# Patient Record
Sex: Female | Born: 1960 | Race: Black or African American | Hispanic: No | Marital: Single | Smoking: Former smoker
Health system: Southern US, Community
[De-identification: ages and names within clinical notes are randomized; demographics above are authoritative.]

## PROBLEM LIST (undated history)

## (undated) DIAGNOSIS — E039 Hypothyroidism, unspecified: Secondary | ICD-10-CM

## (undated) DIAGNOSIS — I099 Rheumatic heart disease, unspecified: Secondary | ICD-10-CM

## (undated) DIAGNOSIS — Z9889 Other specified postprocedural states: Principal | ICD-10-CM

## (undated) DIAGNOSIS — K922 Gastrointestinal hemorrhage, unspecified: Secondary | ICD-10-CM

## (undated) DIAGNOSIS — I272 Pulmonary hypertension, unspecified: Secondary | ICD-10-CM

## (undated) DIAGNOSIS — C73 Malignant neoplasm of thyroid gland: Secondary | ICD-10-CM

## (undated) DIAGNOSIS — I10 Essential (primary) hypertension: Secondary | ICD-10-CM

## (undated) DIAGNOSIS — E785 Hyperlipidemia, unspecified: Secondary | ICD-10-CM

## (undated) DIAGNOSIS — L910 Hypertrophic scar: Secondary | ICD-10-CM

## (undated) DIAGNOSIS — J45909 Unspecified asthma, uncomplicated: Secondary | ICD-10-CM

## (undated) DIAGNOSIS — N189 Chronic kidney disease, unspecified: Secondary | ICD-10-CM

## (undated) DIAGNOSIS — I5042 Chronic combined systolic (congestive) and diastolic (congestive) heart failure: Secondary | ICD-10-CM

## (undated) DIAGNOSIS — I428 Other cardiomyopathies: Principal | ICD-10-CM

## (undated) DIAGNOSIS — N950 Postmenopausal bleeding: Secondary | ICD-10-CM

## (undated) DIAGNOSIS — F0390 Unspecified dementia without behavioral disturbance: Secondary | ICD-10-CM

## (undated) DIAGNOSIS — Z952 Presence of prosthetic heart valve: Secondary | ICD-10-CM

## (undated) HISTORY — PX: THYROIDECTOMY: SHX17

## (undated) HISTORY — DX: Other cardiomyopathies: I42.8

## (undated) HISTORY — DX: Rheumatic heart disease, unspecified: I09.9

## (undated) HISTORY — DX: Other specified postprocedural states: Z98.890

## (undated) HISTORY — DX: Hypothyroidism, unspecified: E03.9

## (undated) HISTORY — PX: TEE WITHOUT CARDIOVERSION: SHX5443

## (undated) HISTORY — DX: Pulmonary hypertension, unspecified: I27.20

## (undated) HISTORY — DX: Essential (primary) hypertension: I10

## (undated) HISTORY — DX: Unspecified dementia, unspecified severity, without behavioral disturbance, psychotic disturbance, mood disturbance, and anxiety: F03.90

## (undated) HISTORY — DX: Hypertrophic scar: L91.0

## (undated) HISTORY — DX: Hyperlipidemia, unspecified: E78.5

## (undated) HISTORY — DX: Postmenopausal bleeding: N95.0

## (undated) HISTORY — DX: Morbid (severe) obesity due to excess calories: E66.01

## (undated) HISTORY — DX: Unspecified asthma, uncomplicated: J45.909

## (undated) HISTORY — DX: Gastrointestinal hemorrhage, unspecified: K92.2

## (undated) HISTORY — DX: Chronic combined systolic (congestive) and diastolic (congestive) heart failure: I50.42

---

## 1997-11-27 ENCOUNTER — Emergency Department (HOSPITAL_COMMUNITY): Admission: EM | Admit: 1997-11-27 | Discharge: 1997-11-28 | Payer: Self-pay | Admitting: Emergency Medicine

## 1997-11-28 ENCOUNTER — Ambulatory Visit (HOSPITAL_COMMUNITY): Admission: RE | Admit: 1997-11-28 | Discharge: 1997-11-28 | Payer: Self-pay | Admitting: Emergency Medicine

## 1998-03-05 ENCOUNTER — Emergency Department (HOSPITAL_COMMUNITY): Admission: EM | Admit: 1998-03-05 | Discharge: 1998-03-05 | Payer: Self-pay | Admitting: Emergency Medicine

## 2001-07-02 ENCOUNTER — Emergency Department (HOSPITAL_COMMUNITY): Admission: EM | Admit: 2001-07-02 | Discharge: 2001-07-02 | Payer: Self-pay | Admitting: Emergency Medicine

## 2001-10-31 ENCOUNTER — Encounter: Payer: Self-pay | Admitting: Obstetrics and Gynecology

## 2001-10-31 ENCOUNTER — Ambulatory Visit (HOSPITAL_COMMUNITY): Admission: RE | Admit: 2001-10-31 | Discharge: 2001-10-31 | Payer: Self-pay | Admitting: Obstetrics and Gynecology

## 2001-12-05 ENCOUNTER — Encounter: Payer: Self-pay | Admitting: Emergency Medicine

## 2001-12-05 ENCOUNTER — Emergency Department (HOSPITAL_COMMUNITY): Admission: EM | Admit: 2001-12-05 | Discharge: 2001-12-05 | Payer: Self-pay | Admitting: Emergency Medicine

## 2004-07-18 ENCOUNTER — Emergency Department (HOSPITAL_COMMUNITY): Admission: EM | Admit: 2004-07-18 | Discharge: 2004-07-18 | Payer: Self-pay | Admitting: Emergency Medicine

## 2004-08-02 ENCOUNTER — Emergency Department (HOSPITAL_COMMUNITY): Admission: EM | Admit: 2004-08-02 | Discharge: 2004-08-02 | Payer: Self-pay | Admitting: Emergency Medicine

## 2005-03-06 ENCOUNTER — Emergency Department (HOSPITAL_COMMUNITY): Admission: EM | Admit: 2005-03-06 | Discharge: 2005-03-06 | Payer: Self-pay | Admitting: Emergency Medicine

## 2005-07-16 ENCOUNTER — Emergency Department (HOSPITAL_COMMUNITY): Admission: EM | Admit: 2005-07-16 | Discharge: 2005-07-16 | Payer: Self-pay | Admitting: Emergency Medicine

## 2005-08-02 ENCOUNTER — Emergency Department (HOSPITAL_COMMUNITY): Admission: EM | Admit: 2005-08-02 | Discharge: 2005-08-02 | Payer: Self-pay | Admitting: Emergency Medicine

## 2005-09-07 ENCOUNTER — Emergency Department (HOSPITAL_COMMUNITY): Admission: EM | Admit: 2005-09-07 | Discharge: 2005-09-07 | Payer: Self-pay | Admitting: Family Medicine

## 2005-10-08 ENCOUNTER — Emergency Department (HOSPITAL_COMMUNITY): Admission: EM | Admit: 2005-10-08 | Discharge: 2005-10-08 | Payer: Self-pay | Admitting: *Deleted

## 2005-10-17 ENCOUNTER — Inpatient Hospital Stay (HOSPITAL_COMMUNITY): Admission: EM | Admit: 2005-10-17 | Discharge: 2005-10-20 | Payer: Self-pay | Admitting: Emergency Medicine

## 2005-10-18 ENCOUNTER — Encounter (INDEPENDENT_AMBULATORY_CARE_PROVIDER_SITE_OTHER): Payer: Self-pay | Admitting: *Deleted

## 2005-10-25 ENCOUNTER — Ambulatory Visit: Payer: Self-pay | Admitting: Gastroenterology

## 2006-10-17 ENCOUNTER — Emergency Department (HOSPITAL_COMMUNITY): Admission: EM | Admit: 2006-10-17 | Discharge: 2006-10-18 | Payer: Self-pay | Admitting: Emergency Medicine

## 2006-10-29 ENCOUNTER — Inpatient Hospital Stay (HOSPITAL_COMMUNITY): Admission: EM | Admit: 2006-10-29 | Discharge: 2006-10-31 | Payer: Self-pay | Admitting: Emergency Medicine

## 2006-10-29 ENCOUNTER — Ambulatory Visit: Payer: Self-pay | Admitting: Family Medicine

## 2006-10-30 ENCOUNTER — Encounter: Payer: Self-pay | Admitting: Family Medicine

## 2007-02-03 ENCOUNTER — Inpatient Hospital Stay (HOSPITAL_COMMUNITY): Admission: EM | Admit: 2007-02-03 | Discharge: 2007-02-07 | Payer: Self-pay | Admitting: Emergency Medicine

## 2007-07-17 ENCOUNTER — Encounter (INDEPENDENT_AMBULATORY_CARE_PROVIDER_SITE_OTHER): Payer: Self-pay | Admitting: Internal Medicine

## 2007-07-17 ENCOUNTER — Inpatient Hospital Stay (HOSPITAL_COMMUNITY): Admission: EM | Admit: 2007-07-17 | Discharge: 2007-07-22 | Payer: Self-pay | Admitting: Emergency Medicine

## 2007-08-04 ENCOUNTER — Inpatient Hospital Stay (HOSPITAL_COMMUNITY): Admission: AD | Admit: 2007-08-04 | Discharge: 2007-08-05 | Payer: Self-pay | Admitting: Obstetrics & Gynecology

## 2007-08-21 ENCOUNTER — Inpatient Hospital Stay (HOSPITAL_COMMUNITY): Admission: AD | Admit: 2007-08-21 | Discharge: 2007-08-21 | Payer: Self-pay | Admitting: Obstetrics & Gynecology

## 2007-09-05 ENCOUNTER — Ambulatory Visit: Payer: Self-pay | Admitting: Obstetrics & Gynecology

## 2007-09-05 ENCOUNTER — Encounter: Payer: Self-pay | Admitting: Obstetrics & Gynecology

## 2007-10-08 ENCOUNTER — Ambulatory Visit: Payer: Self-pay | Admitting: Obstetrics & Gynecology

## 2007-10-08 ENCOUNTER — Other Ambulatory Visit: Admission: RE | Admit: 2007-10-08 | Discharge: 2007-10-08 | Payer: Self-pay | Admitting: Obstetrics & Gynecology

## 2007-10-08 ENCOUNTER — Ambulatory Visit (HOSPITAL_COMMUNITY): Admission: RE | Admit: 2007-10-08 | Discharge: 2007-10-08 | Payer: Self-pay | Admitting: Obstetrics & Gynecology

## 2007-10-08 ENCOUNTER — Encounter: Payer: Self-pay | Admitting: Obstetrics & Gynecology

## 2007-12-10 ENCOUNTER — Emergency Department (HOSPITAL_COMMUNITY): Admission: EM | Admit: 2007-12-10 | Discharge: 2007-12-10 | Payer: Self-pay | Admitting: Emergency Medicine

## 2008-06-13 ENCOUNTER — Inpatient Hospital Stay (HOSPITAL_COMMUNITY): Admission: EM | Admit: 2008-06-13 | Discharge: 2008-06-16 | Payer: Self-pay | Admitting: Emergency Medicine

## 2008-06-13 ENCOUNTER — Other Ambulatory Visit: Payer: Self-pay | Admitting: Obstetrics & Gynecology

## 2008-06-16 ENCOUNTER — Encounter (INDEPENDENT_AMBULATORY_CARE_PROVIDER_SITE_OTHER): Payer: Self-pay | Admitting: *Deleted

## 2008-07-10 ENCOUNTER — Ambulatory Visit: Payer: Self-pay | Admitting: Internal Medicine

## 2008-07-16 ENCOUNTER — Encounter (INDEPENDENT_AMBULATORY_CARE_PROVIDER_SITE_OTHER): Payer: Self-pay | Admitting: Interventional Radiology

## 2008-07-16 ENCOUNTER — Encounter: Admission: RE | Admit: 2008-07-16 | Discharge: 2008-07-16 | Payer: Self-pay | Admitting: Internal Medicine

## 2008-07-16 ENCOUNTER — Other Ambulatory Visit: Admission: RE | Admit: 2008-07-16 | Discharge: 2008-07-16 | Payer: Self-pay | Admitting: Interventional Radiology

## 2008-07-30 ENCOUNTER — Ambulatory Visit: Payer: Self-pay | Admitting: Internal Medicine

## 2008-07-30 LAB — CONVERTED CEMR LAB
Basophils Relative: 1 % (ref 0–1)
Eosinophils Absolute: 0.2 10*3/uL (ref 0.0–0.7)
Hemoglobin: 12.4 g/dL (ref 12.0–15.0)
Lymphs Abs: 1.6 10*3/uL (ref 0.7–4.0)
MCHC: 32.5 g/dL (ref 30.0–36.0)
MCV: 77.3 fL — ABNORMAL LOW (ref 78.0–100.0)
Monocytes Absolute: 0.7 10*3/uL (ref 0.1–1.0)
Monocytes Relative: 8 % (ref 3–12)
RBC: 4.94 M/uL (ref 3.87–5.11)
Retic Ct Pct: 1 % (ref 0.4–3.1)

## 2008-07-31 ENCOUNTER — Ambulatory Visit: Payer: Self-pay | Admitting: Obstetrics and Gynecology

## 2008-07-31 LAB — CONVERTED CEMR LAB
HCT: 35.8 % — ABNORMAL LOW (ref 36.0–46.0)
Hemoglobin: 11.8 g/dL — ABNORMAL LOW (ref 12.0–15.0)
RBC: 4.67 M/uL (ref 3.87–5.11)
RDW: 27.4 % — ABNORMAL HIGH (ref 11.5–15.5)

## 2008-09-10 ENCOUNTER — Encounter (INDEPENDENT_AMBULATORY_CARE_PROVIDER_SITE_OTHER): Payer: Self-pay | Admitting: *Deleted

## 2008-09-14 IMAGING — US US PELVIS COMPLETE MODIFY
1 series · 14 of 25 positions shown · non-contrast
Comparison: None.

CLINICAL DATA: Menorrhagia.  
 TRANSABDOMINAL AND TRANSVAGINAL PELVIC ULTRASOUND:
TECHNIQUE: Both transabdominal and transvaginal ultrasound examinations of the pelvis were performed including evaluation of the uterus, ovaries, adnexal regions, and pelvic cul-de-sac.

[Series 1: us pelvis complete modify · 0.24mm/px · 14 of 42 slices shown]
[im 1/42]
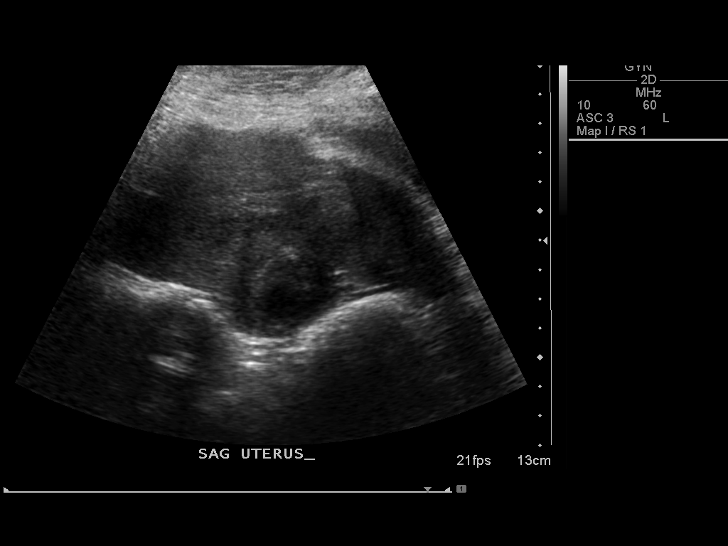
[im 4/42]
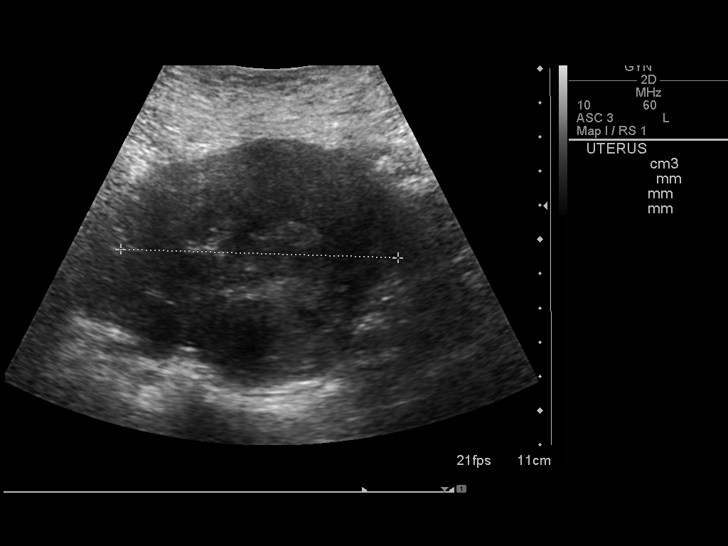
[im 7/42]
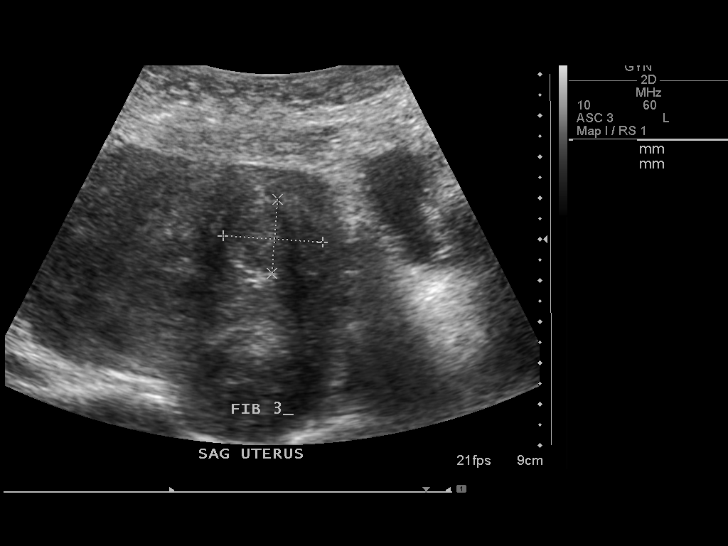
[im 11/42]
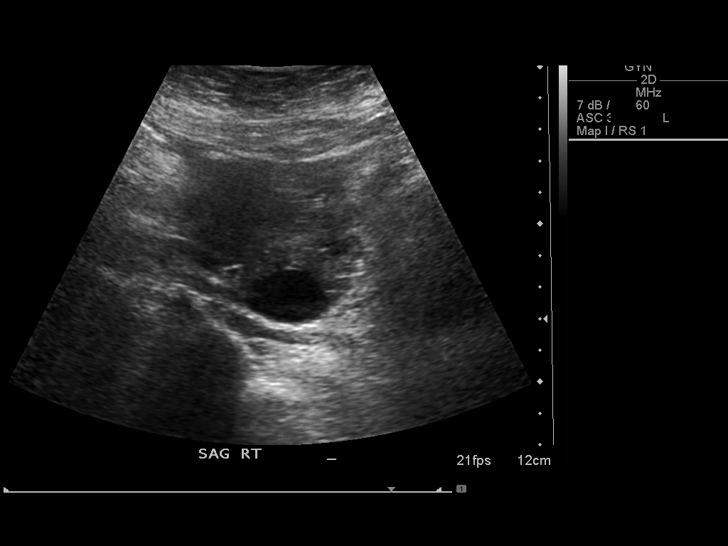
[im 14/42]
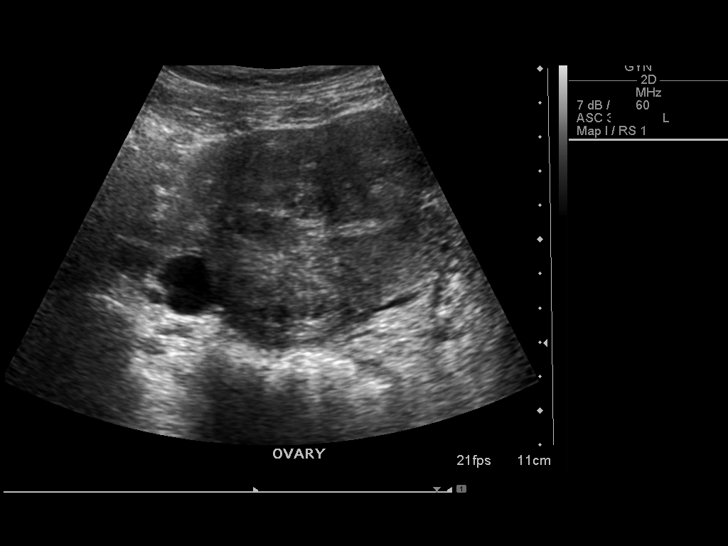
[im 16/42]
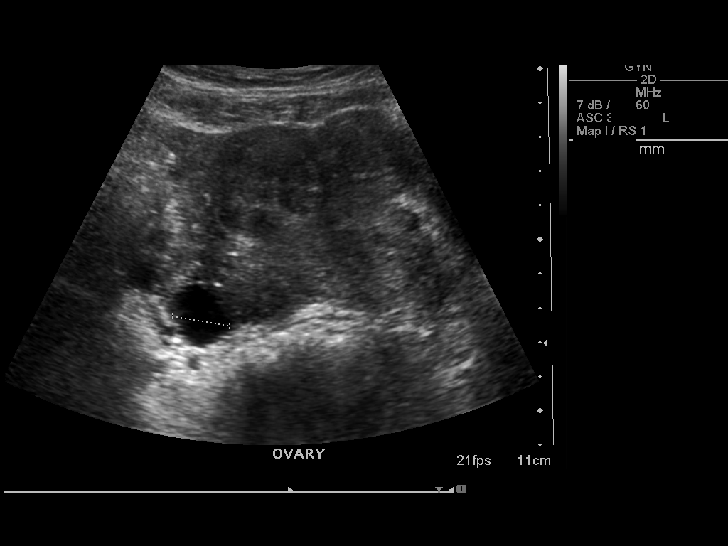
[im 19/42]
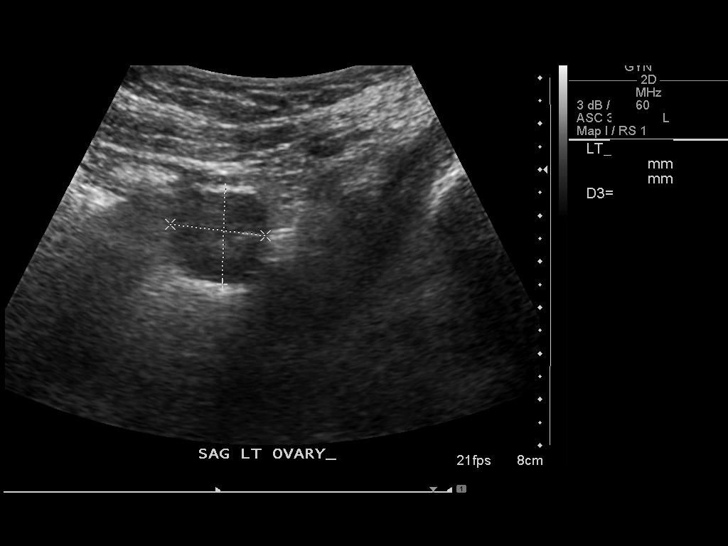
[im 23/42]
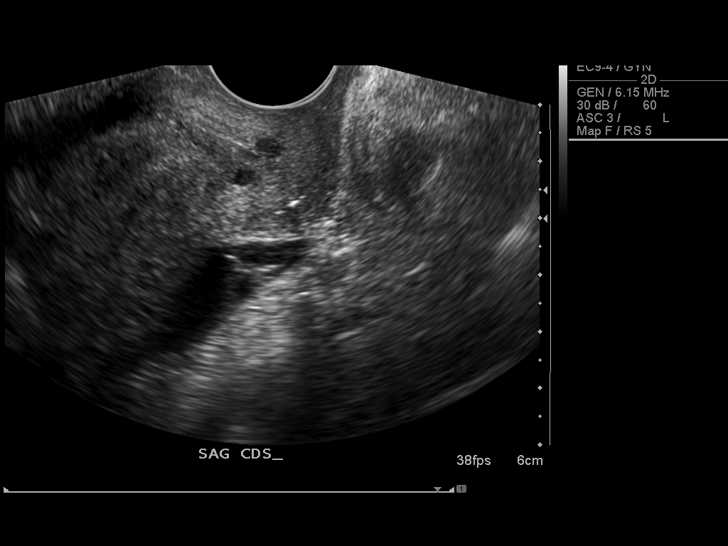
[im 26/42]
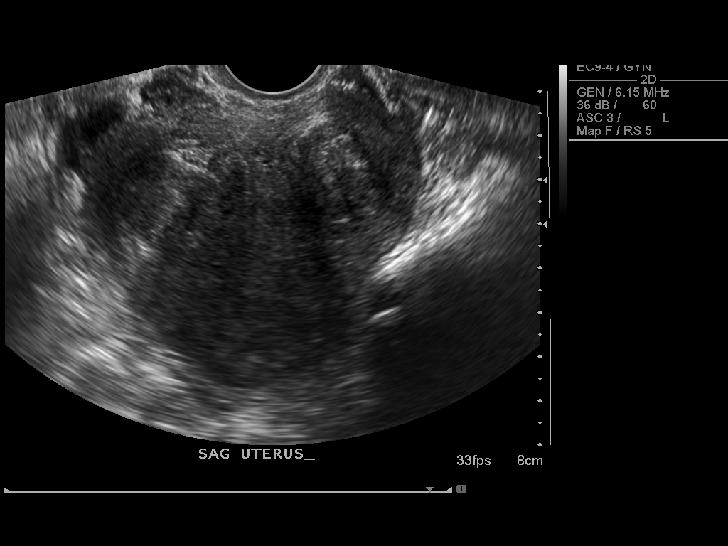
[im 28/42]
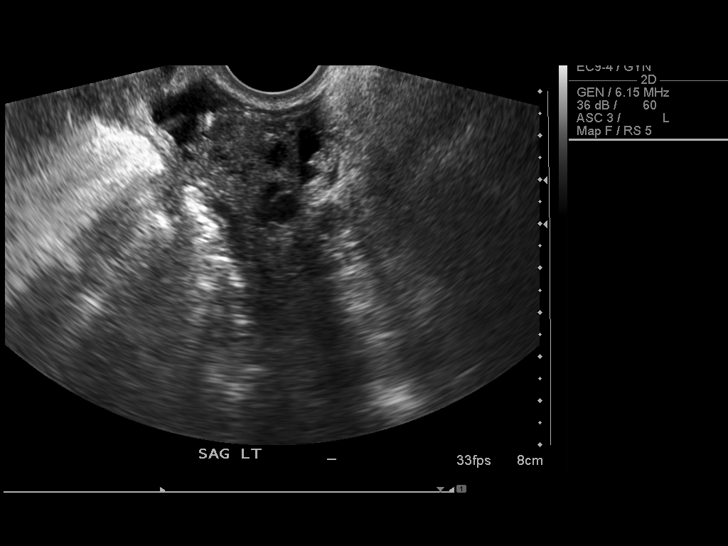
[im 31/42]
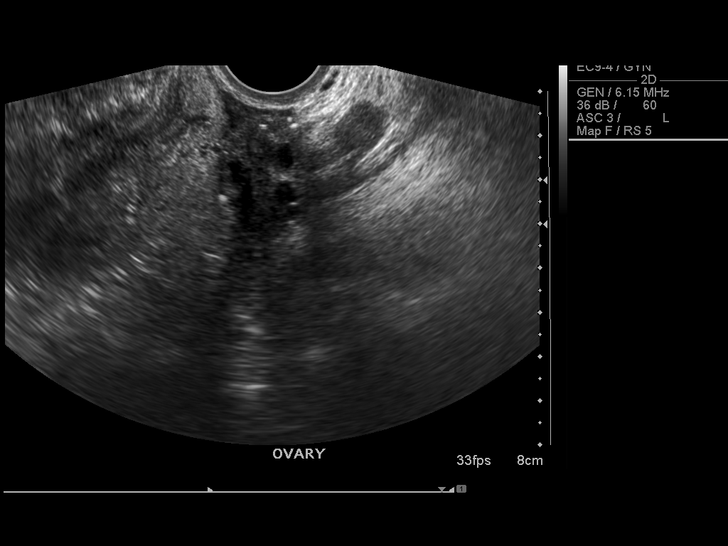
[im 35/42]
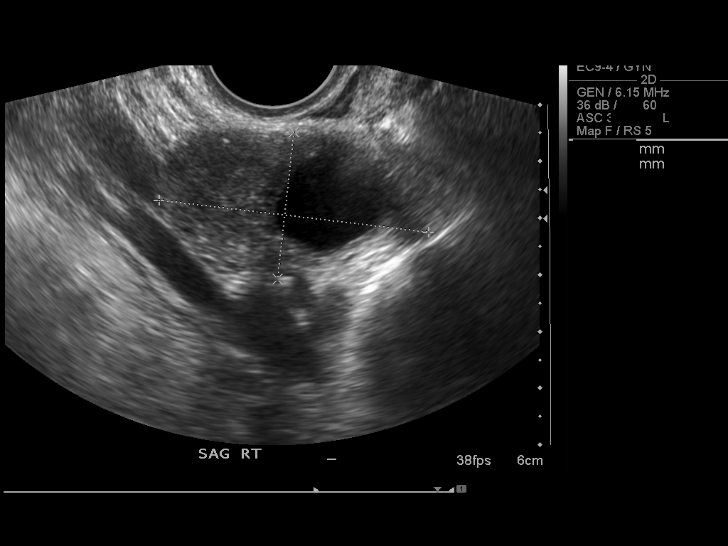
[im 38/42]
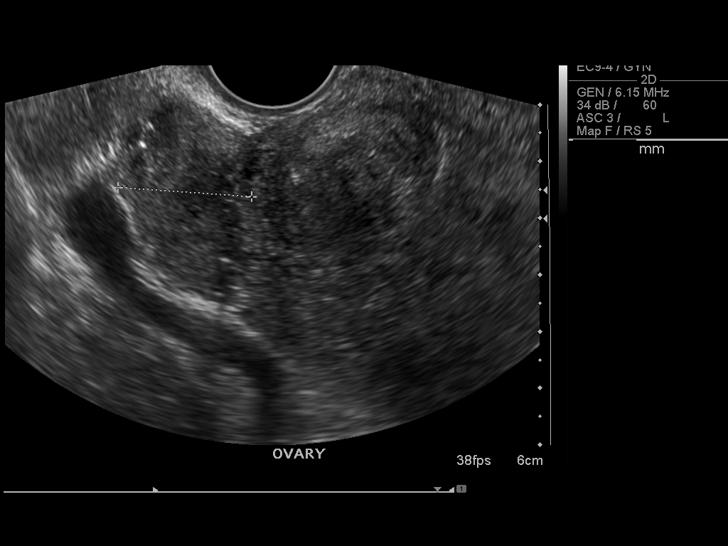
[im 42/42]
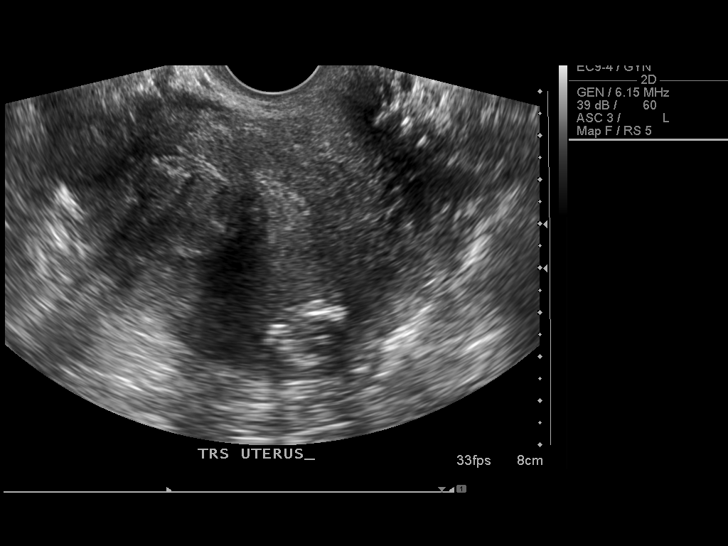

[14 of 25 positions shown; findings below may reference images not displayed]

FINDINGS: Overall uterine measurements 11.5 x 8.1 x 7.1 cm.  Multiple predominantly intramural, partially subserosal hypoechoic uterine masses are identified, likely fibroids.  The largest of these in the posterior uterine body is partly subserosal/partially intramural, measuring 4.4 x 3.7 x 2.8 cm.  Some intramural fibroids produce mass effect upon the endometrial stripe, which measures 9 mm and is uniformly echogenic.  Ovaries are normal.  A small amount of pelvic free fluid is noted.
IMPRESSION: 1.  Fibroid uterus, some of which demonstrate mass effect upon the endometrial stripe.
 2.  Normal ovaries bilaterally.

## 2008-10-21 ENCOUNTER — Ambulatory Visit: Payer: Self-pay | Admitting: Internal Medicine

## 2008-10-21 LAB — CONVERTED CEMR LAB
Alkaline Phosphatase: 78 units/L (ref 39–117)
BUN: 11 mg/dL (ref 6–23)
Basophils Absolute: 0 10*3/uL (ref 0.0–0.1)
Basophils Relative: 1 % (ref 0–1)
CO2: 24 meq/L (ref 19–32)
Creatinine, Ser: 0.82 mg/dL (ref 0.40–1.20)
Eosinophils Absolute: 0.2 10*3/uL (ref 0.0–0.7)
Eosinophils Relative: 3 % (ref 0–5)
Free T4: 1.05 ng/dL (ref 0.80–1.80)
Glucose, Bld: 87 mg/dL (ref 70–99)
Hemoglobin: 12.6 g/dL (ref 12.0–15.0)
MCHC: 32.1 g/dL (ref 30.0–36.0)
MCV: 87.9 fL (ref 78.0–100.0)
Magnesium: 2.1 mg/dL (ref 1.5–2.5)
Monocytes Absolute: 0.4 10*3/uL (ref 0.1–1.0)
Neutro Abs: 5.7 10*3/uL (ref 1.7–7.7)
Pro B Natriuretic peptide (BNP): 454.2 pg/mL — ABNORMAL HIGH (ref 0.0–100.0)
RDW: 15.9 % — ABNORMAL HIGH (ref 11.5–15.5)
Sodium: 141 meq/L (ref 135–145)
TSH: 0.708 microintl units/mL (ref 0.350–4.500)
Total Bilirubin: 0.9 mg/dL (ref 0.3–1.2)

## 2008-10-28 ENCOUNTER — Encounter: Payer: Self-pay | Admitting: Internal Medicine

## 2008-10-28 ENCOUNTER — Ambulatory Visit (HOSPITAL_COMMUNITY): Admission: RE | Admit: 2008-10-28 | Discharge: 2008-10-28 | Payer: Self-pay | Admitting: Internal Medicine

## 2008-11-18 ENCOUNTER — Ambulatory Visit: Payer: Self-pay | Admitting: Internal Medicine

## 2008-11-26 ENCOUNTER — Encounter (INDEPENDENT_AMBULATORY_CARE_PROVIDER_SITE_OTHER): Payer: Self-pay | Admitting: Cardiology

## 2008-11-26 ENCOUNTER — Inpatient Hospital Stay (HOSPITAL_COMMUNITY): Admission: RE | Admit: 2008-11-26 | Discharge: 2008-12-05 | Payer: Self-pay | Admitting: Cardiology

## 2008-11-27 ENCOUNTER — Encounter (INDEPENDENT_AMBULATORY_CARE_PROVIDER_SITE_OTHER): Payer: Self-pay | Admitting: Cardiology

## 2008-11-27 ENCOUNTER — Ambulatory Visit: Payer: Self-pay | Admitting: Surgery

## 2008-11-27 ENCOUNTER — Ambulatory Visit: Payer: Self-pay | Admitting: Cardiothoracic Surgery

## 2008-11-28 ENCOUNTER — Ambulatory Visit: Payer: Self-pay | Admitting: Dentistry

## 2008-12-01 ENCOUNTER — Encounter (HOSPITAL_COMMUNITY): Payer: Self-pay | Admitting: Dentistry

## 2008-12-10 ENCOUNTER — Encounter: Admission: AD | Admit: 2008-12-10 | Discharge: 2008-12-10 | Payer: Self-pay | Admitting: Dentistry

## 2008-12-16 ENCOUNTER — Ambulatory Visit: Payer: Self-pay | Admitting: Thoracic Surgery (Cardiothoracic Vascular Surgery)

## 2008-12-19 ENCOUNTER — Encounter: Payer: Self-pay | Admitting: Thoracic Surgery (Cardiothoracic Vascular Surgery)

## 2008-12-19 ENCOUNTER — Ambulatory Visit (HOSPITAL_COMMUNITY)
Admission: RE | Admit: 2008-12-19 | Discharge: 2008-12-19 | Payer: Self-pay | Admitting: Thoracic Surgery (Cardiothoracic Vascular Surgery)

## 2008-12-19 ENCOUNTER — Ambulatory Visit: Payer: Self-pay | Admitting: *Deleted

## 2008-12-21 DIAGNOSIS — I428 Other cardiomyopathies: Secondary | ICD-10-CM

## 2008-12-21 HISTORY — PX: AORTIC VALVE REPAIR: SHX6306

## 2008-12-21 HISTORY — PX: CARDIAC CATHETERIZATION: SHX172

## 2008-12-21 HISTORY — PX: MITRAL VALVE REPAIR: SHX2039

## 2008-12-21 HISTORY — DX: Other cardiomyopathies: I42.8

## 2008-12-23 ENCOUNTER — Inpatient Hospital Stay (HOSPITAL_COMMUNITY)
Admission: RE | Admit: 2008-12-23 | Discharge: 2009-01-01 | Payer: Self-pay | Admitting: Thoracic Surgery (Cardiothoracic Vascular Surgery)

## 2008-12-23 ENCOUNTER — Encounter: Payer: Self-pay | Admitting: Thoracic Surgery (Cardiothoracic Vascular Surgery)

## 2008-12-23 DIAGNOSIS — Z9889 Other specified postprocedural states: Secondary | ICD-10-CM

## 2008-12-23 DIAGNOSIS — I099 Rheumatic heart disease, unspecified: Secondary | ICD-10-CM | POA: Diagnosis present

## 2008-12-23 HISTORY — DX: Other specified postprocedural states: Z98.890

## 2008-12-23 HISTORY — DX: Rheumatic heart disease, unspecified: I09.9

## 2009-01-19 ENCOUNTER — Encounter
Admission: RE | Admit: 2009-01-19 | Discharge: 2009-01-19 | Payer: Self-pay | Admitting: Thoracic Surgery (Cardiothoracic Vascular Surgery)

## 2009-01-19 ENCOUNTER — Ambulatory Visit: Payer: Self-pay | Admitting: Thoracic Surgery (Cardiothoracic Vascular Surgery)

## 2009-02-04 ENCOUNTER — Ambulatory Visit: Payer: Self-pay | Admitting: Dentistry

## 2009-03-16 ENCOUNTER — Ambulatory Visit: Payer: Self-pay | Admitting: Thoracic Surgery (Cardiothoracic Vascular Surgery)

## 2009-03-30 ENCOUNTER — Ambulatory Visit: Payer: Self-pay | Admitting: Dentistry

## 2009-04-06 ENCOUNTER — Telehealth (INDEPENDENT_AMBULATORY_CARE_PROVIDER_SITE_OTHER): Payer: Self-pay | Admitting: *Deleted

## 2009-05-05 ENCOUNTER — Emergency Department (HOSPITAL_COMMUNITY): Admission: EM | Admit: 2009-05-05 | Discharge: 2009-05-05 | Payer: Self-pay | Admitting: Emergency Medicine

## 2009-05-18 ENCOUNTER — Ambulatory Visit: Payer: Self-pay | Admitting: Internal Medicine

## 2009-05-18 LAB — CONVERTED CEMR LAB
ALT: 12 units/L (ref 0–35)
AST: 15 units/L (ref 0–37)
CO2: 21 meq/L (ref 19–32)
Chloride: 107 meq/L (ref 96–112)
Creatinine, Ser: 0.82 mg/dL (ref 0.40–1.20)
Eosinophils Absolute: 0.1 10*3/uL (ref 0.0–0.7)
Lymphs Abs: 1.7 10*3/uL (ref 0.7–4.0)
Monocytes Relative: 6 % (ref 3–12)
Neutro Abs: 5.5 10*3/uL (ref 1.7–7.7)
Neutrophils Relative %: 71 % (ref 43–77)
Platelets: 281 10*3/uL (ref 150–400)
RBC: 4.66 M/uL (ref 3.87–5.11)
Sodium: 139 meq/L (ref 135–145)
TSH: 1.68 microintl units/mL (ref 0.350–4.500)
Total Bilirubin: 0.6 mg/dL (ref 0.3–1.2)
Total Protein: 6.8 g/dL (ref 6.0–8.3)
WBC: 7.8 10*3/uL (ref 4.0–10.5)

## 2009-06-26 ENCOUNTER — Ambulatory Visit: Payer: Self-pay | Admitting: Internal Medicine

## 2009-06-26 LAB — CONVERTED CEMR LAB
BUN: 20 mg/dL (ref 6–23)
Chloride: 106 meq/L (ref 96–112)
Creatinine, Ser: 0.87 mg/dL (ref 0.40–1.20)
Glucose, Bld: 83 mg/dL (ref 70–99)
Potassium: 4.7 meq/L (ref 3.5–5.3)

## 2009-07-01 ENCOUNTER — Encounter: Admission: RE | Admit: 2009-07-01 | Discharge: 2009-07-01 | Payer: Self-pay | Admitting: Cardiology

## 2009-07-25 IMAGING — CR DG CHEST 2V
2 series · 2 of 2 positions shown · non-contrast
Comparison: 12/10/2007

CLINICAL DATA: Chest pain, shortness of breath

CHEST - 2 VIEW

[w chest pa]
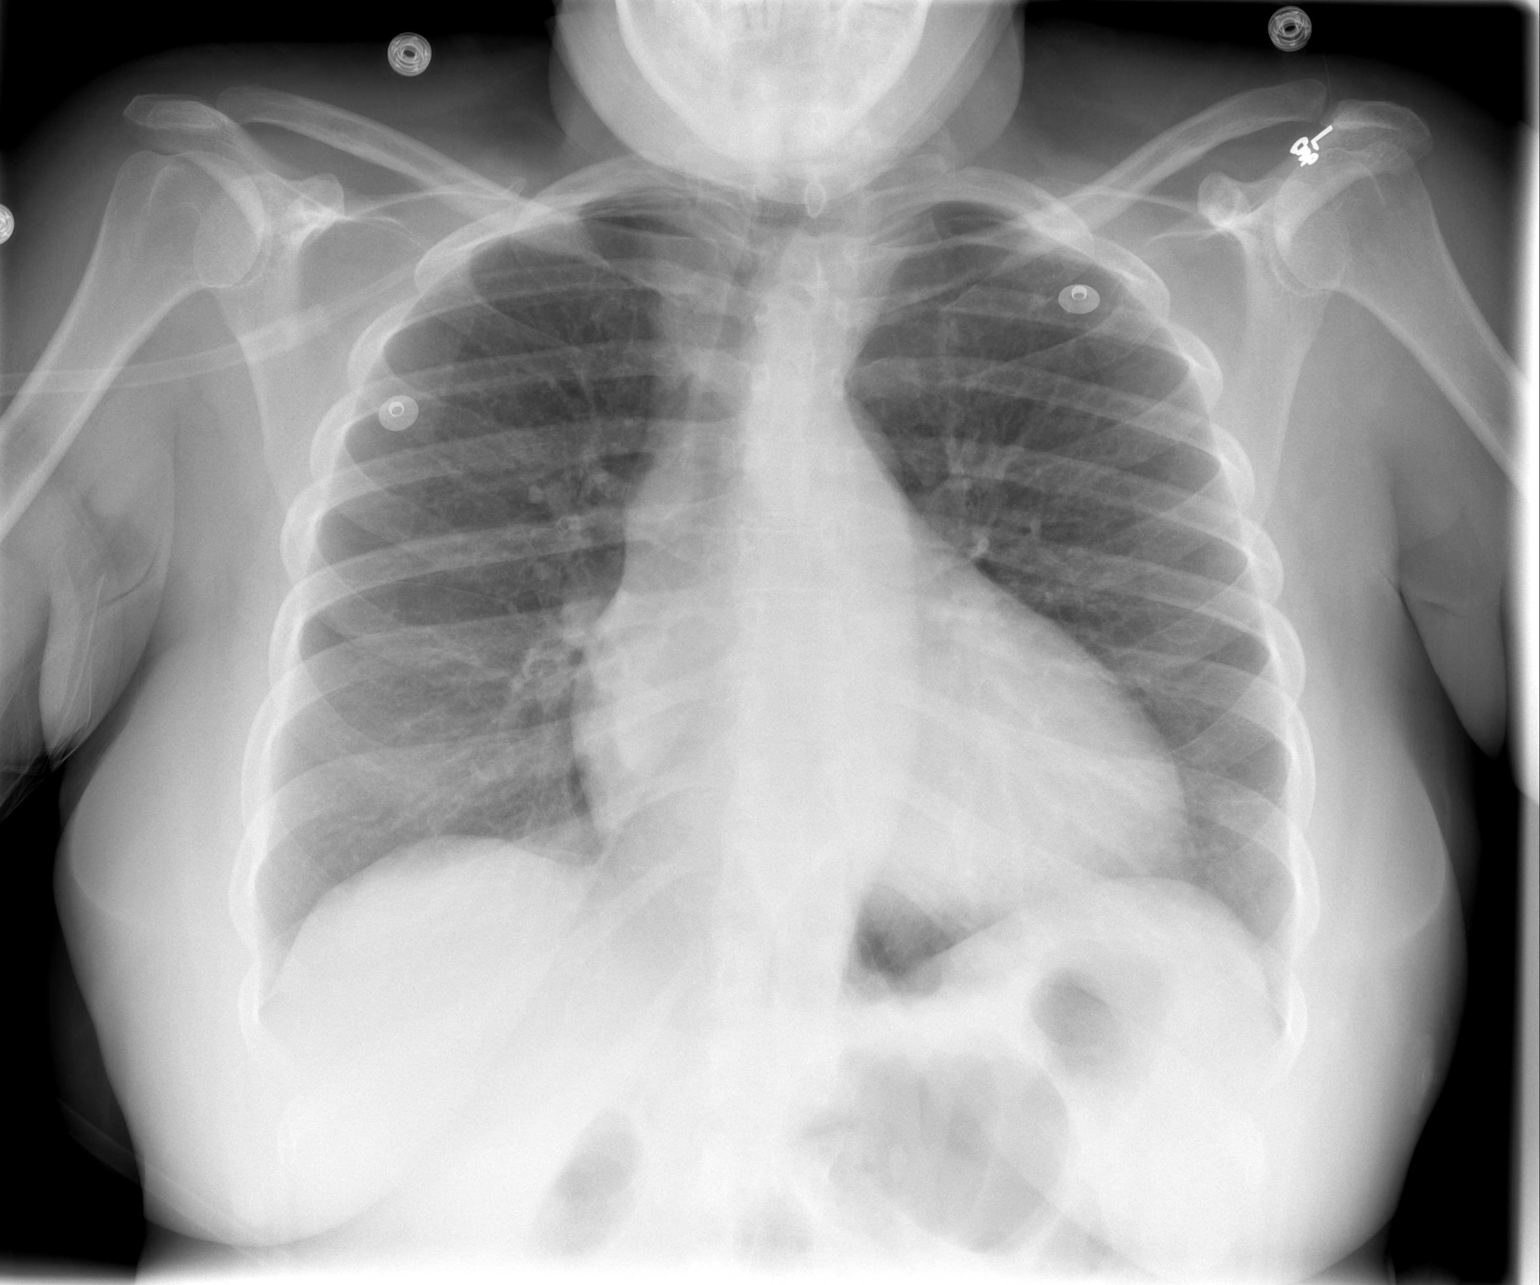

[w chest lat]
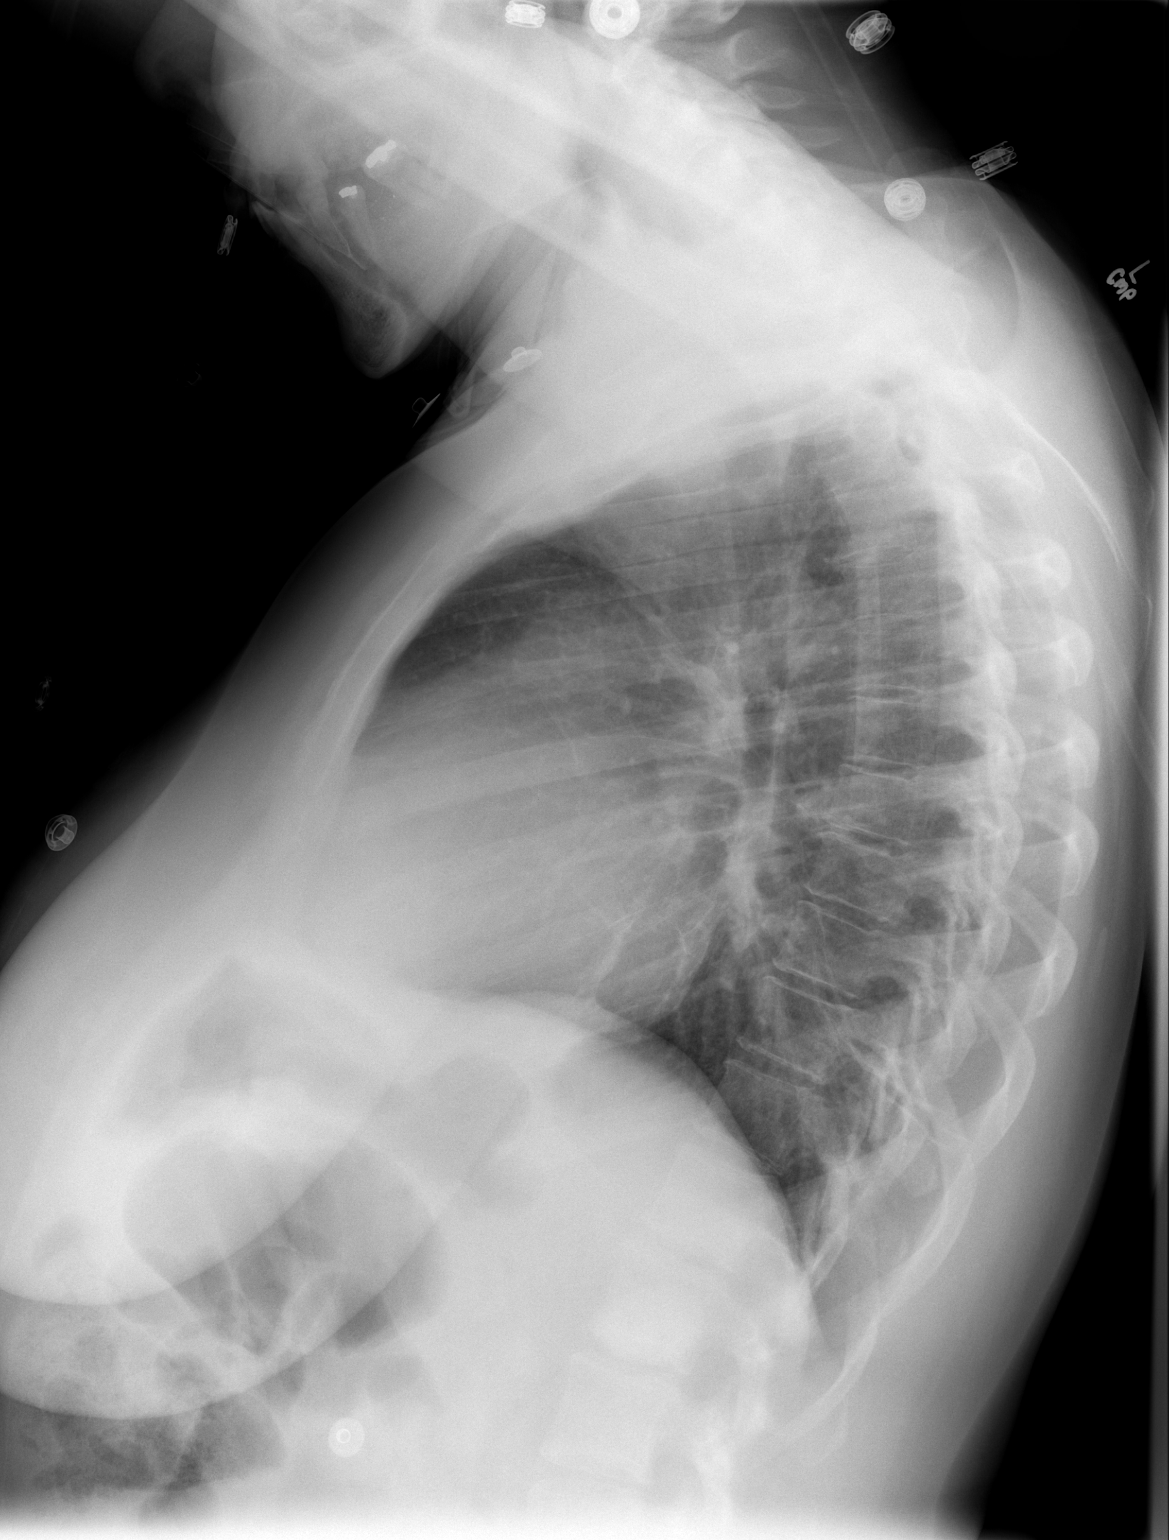

[2 of 2 positions shown; findings below may reference images not displayed]

FINDINGS: Stable cardiomegaly without CHF, pneumonia, effusion or
pneumothorax.  Trachea is deviated slightly to the right.  This is
suspicious for a substernal left thyroid abnormality.  Consider
follow-up ultrasound.
IMPRESSION: Cardiomegaly without CHF or pneumonia
Rightward tracheal deviation suspicious for a substernal left
thyroid goiter versus mass

## 2009-07-27 ENCOUNTER — Encounter
Admission: RE | Admit: 2009-07-27 | Discharge: 2009-07-27 | Payer: Self-pay | Admitting: Thoracic Surgery (Cardiothoracic Vascular Surgery)

## 2009-08-03 ENCOUNTER — Ambulatory Visit: Payer: Self-pay | Admitting: Thoracic Surgery (Cardiothoracic Vascular Surgery)

## 2009-08-06 ENCOUNTER — Encounter
Admission: RE | Admit: 2009-08-06 | Discharge: 2009-08-06 | Payer: Self-pay | Admitting: Thoracic Surgery (Cardiothoracic Vascular Surgery)

## 2009-08-17 ENCOUNTER — Ambulatory Visit: Payer: Self-pay | Admitting: Internal Medicine

## 2009-09-17 ENCOUNTER — Ambulatory Visit (HOSPITAL_COMMUNITY): Admission: RE | Admit: 2009-09-17 | Discharge: 2009-09-18 | Payer: Self-pay | Admitting: Surgery

## 2009-09-17 ENCOUNTER — Encounter (INDEPENDENT_AMBULATORY_CARE_PROVIDER_SITE_OTHER): Payer: Self-pay | Admitting: Surgery

## 2009-09-17 DIAGNOSIS — C73 Malignant neoplasm of thyroid gland: Secondary | ICD-10-CM

## 2009-09-17 HISTORY — PX: THYROID LOBECTOMY: SHX420

## 2009-09-17 HISTORY — DX: Malignant neoplasm of thyroid gland: C73

## 2009-09-22 ENCOUNTER — Encounter (INDEPENDENT_AMBULATORY_CARE_PROVIDER_SITE_OTHER): Payer: Self-pay | Admitting: Surgery

## 2009-09-22 ENCOUNTER — Ambulatory Visit (HOSPITAL_COMMUNITY): Admission: RE | Admit: 2009-09-22 | Discharge: 2009-09-23 | Payer: Self-pay | Admitting: Surgery

## 2009-12-02 ENCOUNTER — Encounter (HOSPITAL_COMMUNITY): Admission: RE | Admit: 2009-12-02 | Discharge: 2009-12-11 | Payer: Self-pay | Admitting: Endocrinology

## 2010-02-01 ENCOUNTER — Encounter
Admission: RE | Admit: 2010-02-01 | Discharge: 2010-02-01 | Payer: Self-pay | Admitting: Thoracic Surgery (Cardiothoracic Vascular Surgery)

## 2010-02-01 ENCOUNTER — Ambulatory Visit: Payer: Self-pay | Admitting: Thoracic Surgery (Cardiothoracic Vascular Surgery)

## 2010-02-03 IMAGING — CR DG CHEST 1V PORT
1 series · 1 of 1 positions shown · non-contrast
Comparison: 12/19/2008

CLINICAL DATA: CABG.  Mitral regurgitation.  Aortic insufficiency.

PORTABLE CHEST - 1 VIEW

[view not recorded]
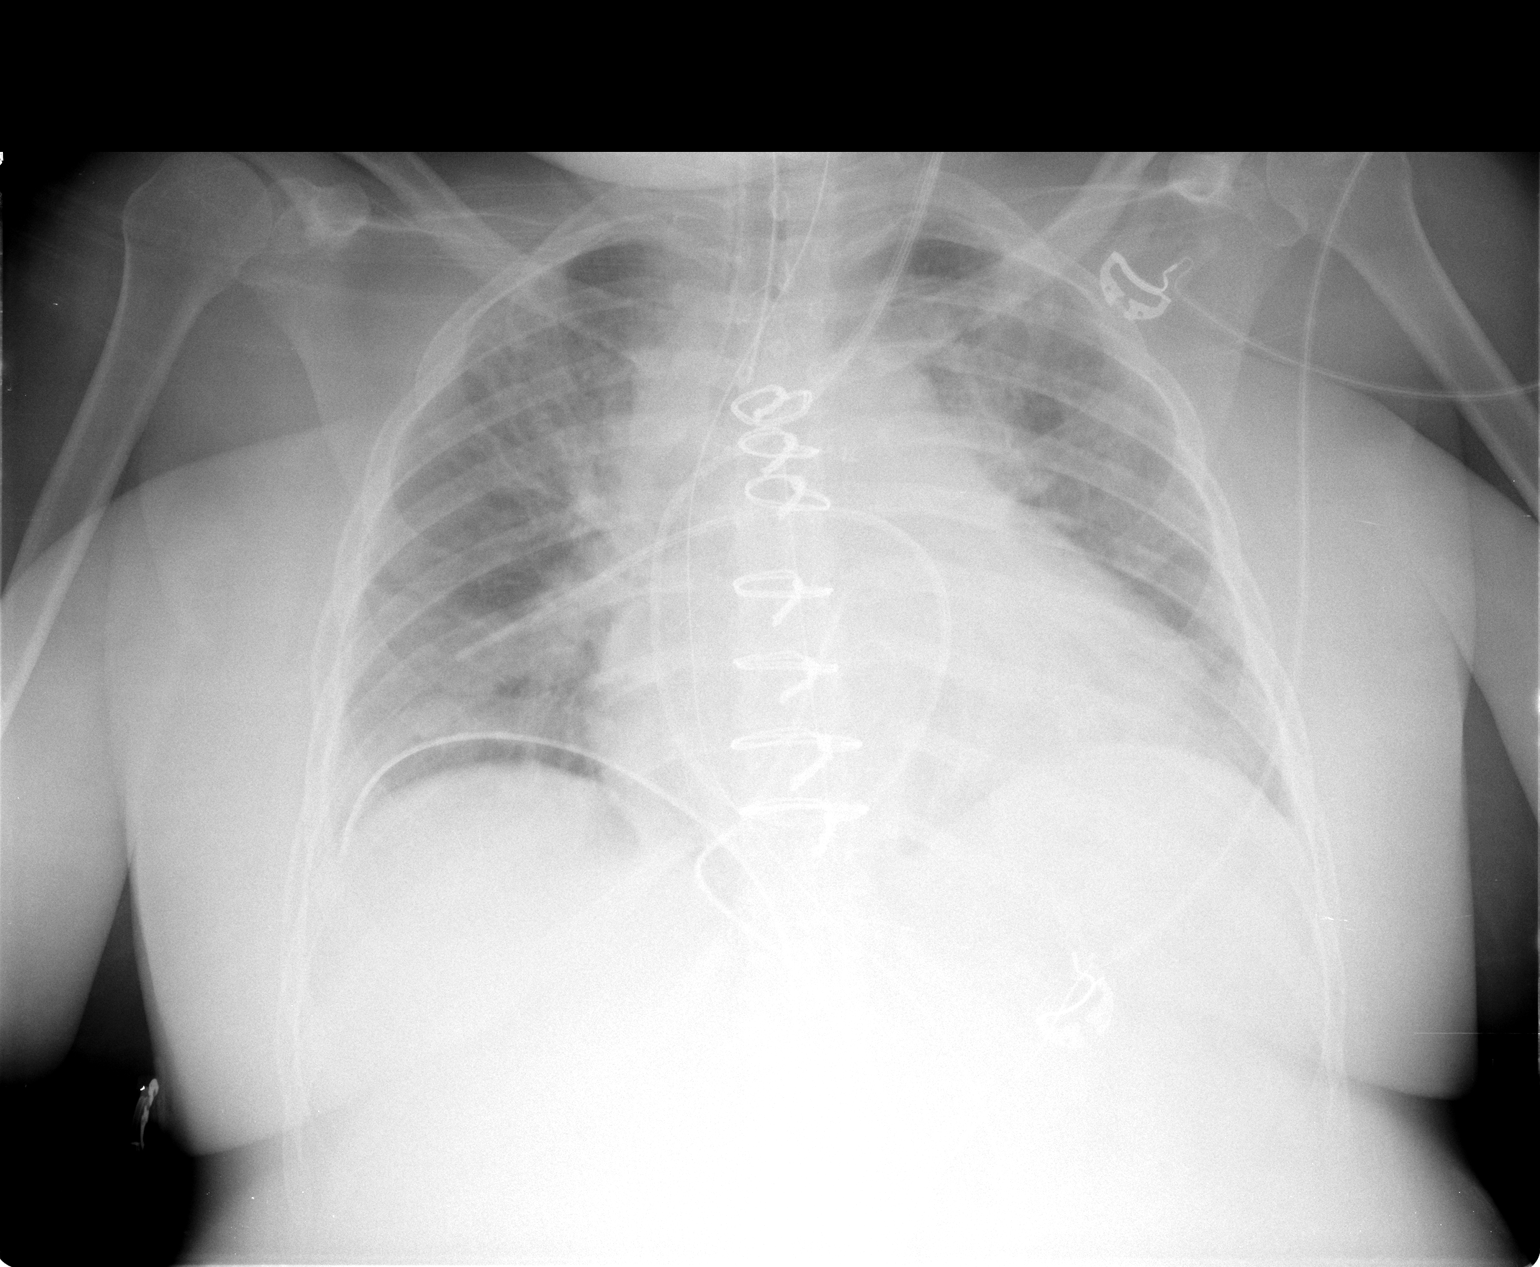

[1 of 1 positions shown; findings below may reference images not displayed]

FINDINGS: Cardiomegaly, pulmonary vascular congestion, and mild
pulmonary edema.  ETT is in the lower trachea.  NG tube is
traversing the esophagus and proximal to mid stomach.  Swan-Ganz
catheter is well into a right lower lung zone pulmonary artery
branch. The catheter should probably be pulled back some.
IMPRESSION: Cardiomegaly, vascular congestion, and pulmonary edema.  SGC
probably needs be pulled back some.  It is rather distal in
location.

## 2010-03-02 IMAGING — CR DG CHEST 2V
2 series · 2 of 2 positions shown · non-contrast
Comparison: September 28, 2008.

CLINICAL DATA: CABG.  Mitral regurgitation.  Aortic insufficiency.

CHEST - 2 VIEW

[w chest pa]
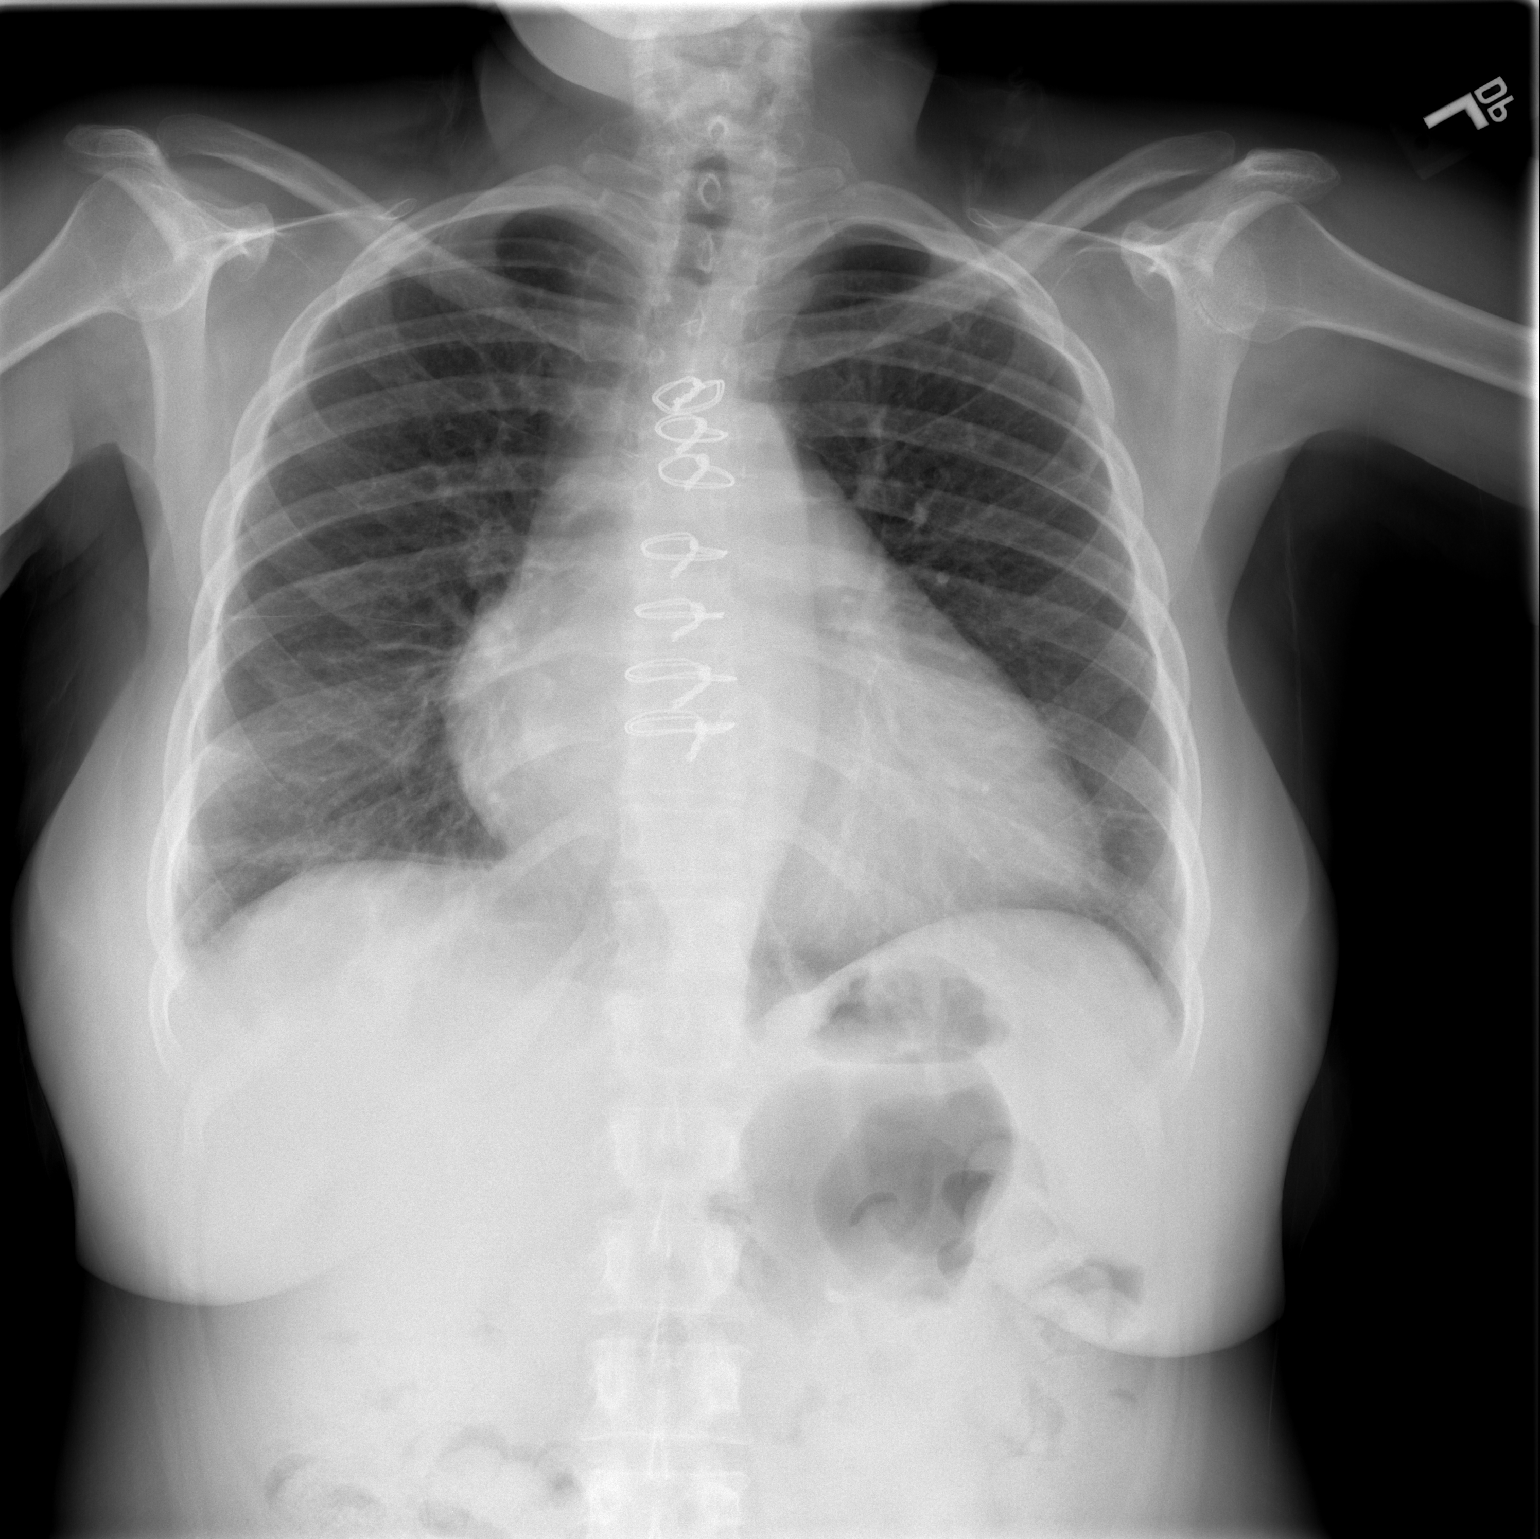

[w chest lat]
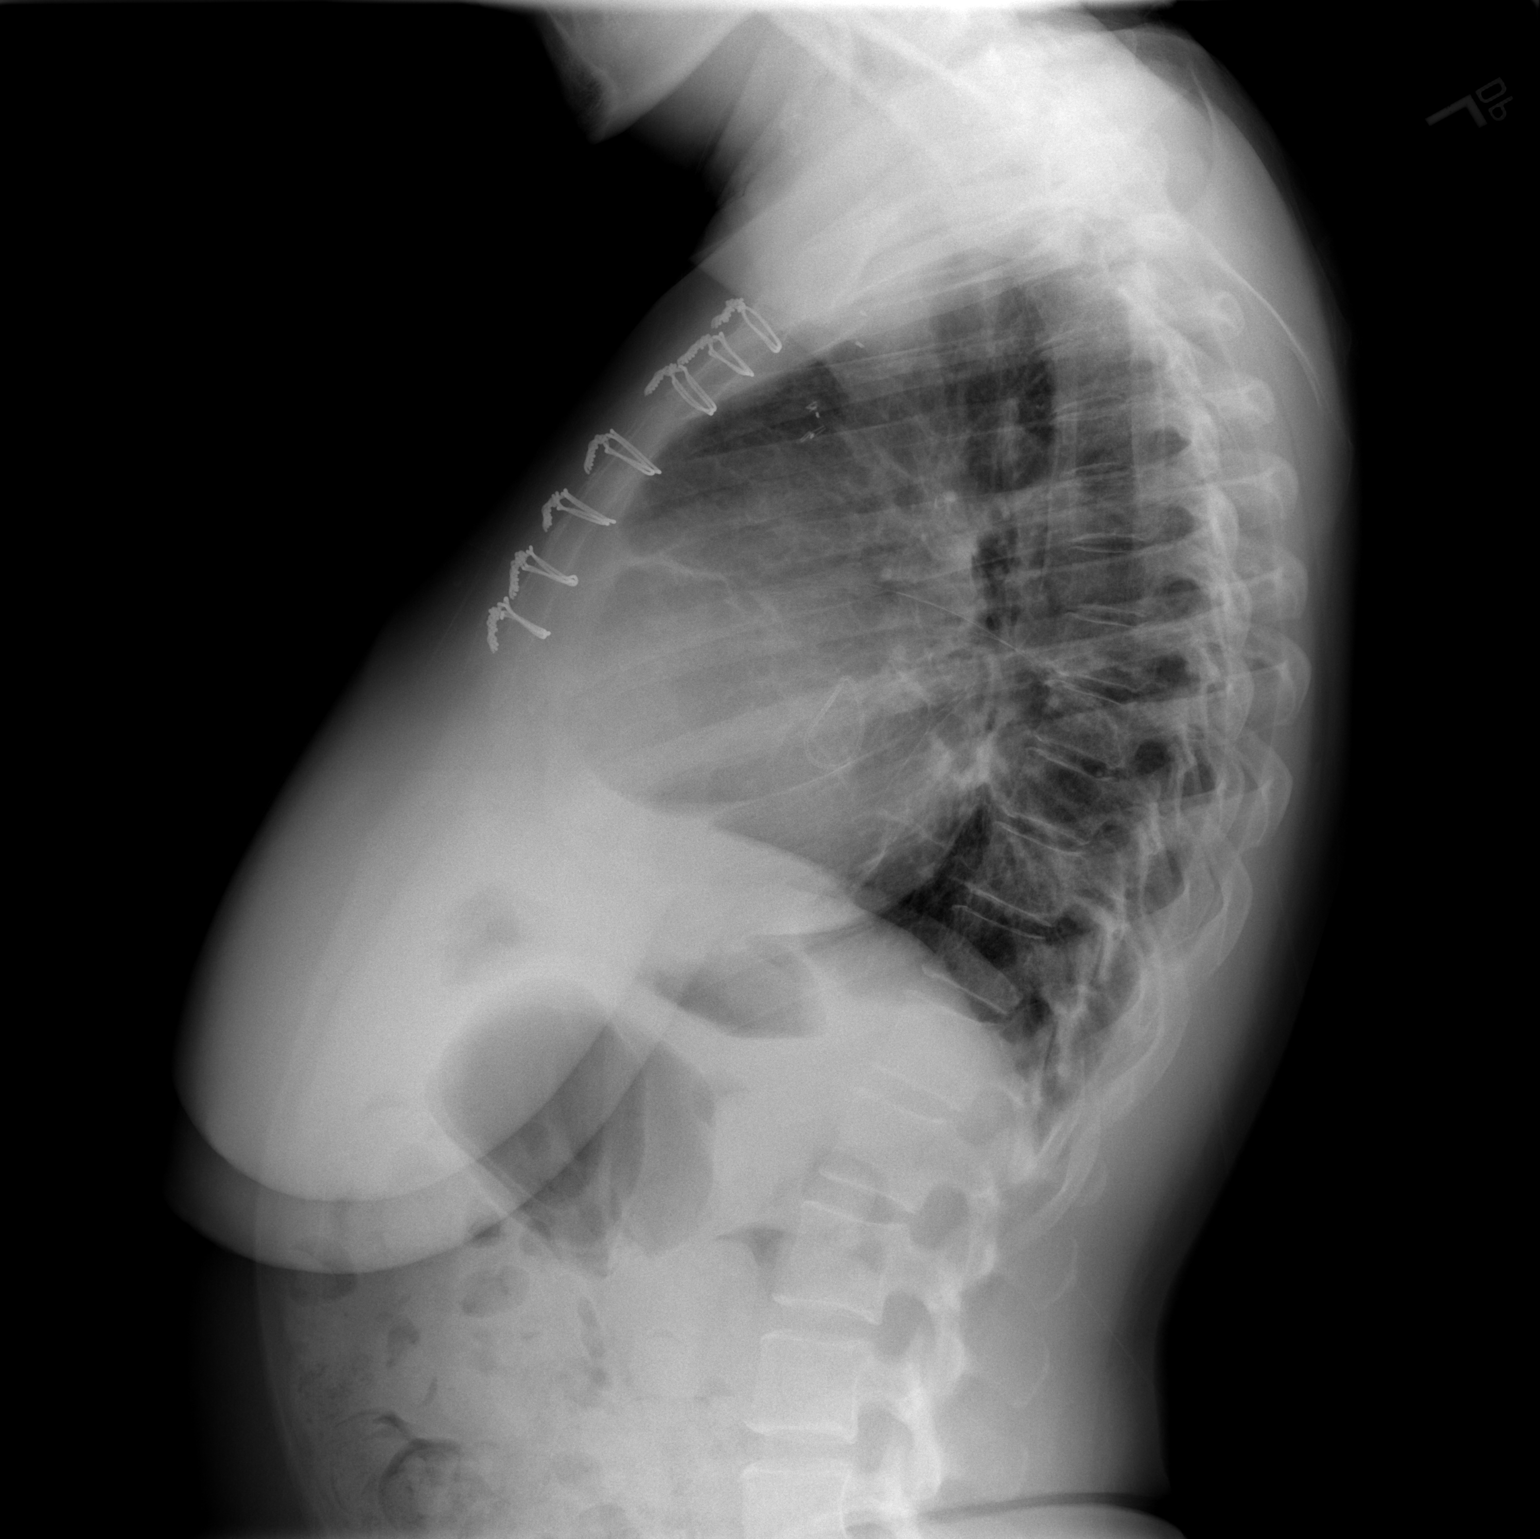

[2 of 2 positions shown; findings below may reference images not displayed]

FINDINGS: Cardiopericardial silhouette appears smaller in size.
There is decreased atelectasis at the lung bases with resolution of
the left pleural effusion and only trace pleural fluid seen on the
right. Imaged bony structures of the thorax are intact.
IMPRESSION: Improving aeration at the lung bases with decreasing pleural
effusions.

## 2010-06-13 ENCOUNTER — Encounter: Payer: Self-pay | Admitting: Thoracic Surgery (Cardiothoracic Vascular Surgery)

## 2010-06-13 ENCOUNTER — Encounter: Payer: Self-pay | Admitting: *Deleted

## 2010-06-13 ENCOUNTER — Encounter: Payer: Self-pay | Admitting: Endocrinology

## 2010-06-14 ENCOUNTER — Encounter: Payer: Self-pay | Admitting: Thoracic Surgery (Cardiothoracic Vascular Surgery)

## 2010-07-30 ENCOUNTER — Encounter (INDEPENDENT_AMBULATORY_CARE_PROVIDER_SITE_OTHER): Payer: Self-pay | Admitting: Family Medicine

## 2010-07-30 LAB — CONVERTED CEMR LAB
Alkaline Phosphatase: 125 units/L — ABNORMAL HIGH (ref 39–117)
BUN: 14 mg/dL (ref 6–23)
CO2: 21 meq/L (ref 19–32)
Cholesterol: 171 mg/dL (ref 0–200)
Creatinine, Ser: 0.86 mg/dL (ref 0.40–1.20)
Glucose, Bld: 84 mg/dL (ref 70–99)
HDL: 36 mg/dL — ABNORMAL LOW (ref >39)
LDL Cholesterol: 119 mg/dL — ABNORMAL HIGH (ref 0–99)
Total Bilirubin: 0.4 mg/dL (ref 0.3–1.2)
Total CHOL/HDL Ratio: 4.8
Triglycerides: 82 mg/dL (ref <150)
VLDL: 16 mg/dL (ref 0–40)

## 2010-08-10 LAB — CBC
HCT: 39.6 % (ref 36.0–46.0)
Hemoglobin: 11.9 g/dL — ABNORMAL LOW (ref 12.0–15.0)
MCHC: 32.9 g/dL (ref 30.0–36.0)
MCHC: 33.1 g/dL (ref 30.0–36.0)
MCV: 82.6 fL (ref 78.0–100.0)
MCV: 81.1 fL (ref 78.0–100.0)
Platelets: 184 10*3/uL (ref 150–400)
RBC: 4.44 MIL/uL (ref 3.87–5.11)
RDW: 19.9 % — ABNORMAL HIGH (ref 11.5–15.5)
WBC: 7.4 10*3/uL (ref 4.0–10.5)

## 2010-08-10 LAB — URINALYSIS, ROUTINE W REFLEX MICROSCOPIC
Bilirubin Urine: NEGATIVE
Bilirubin Urine: NEGATIVE
Glucose, UA: NEGATIVE mg/dL
Hgb urine dipstick: NEGATIVE
Hgb urine dipstick: NEGATIVE
Ketones, ur: NEGATIVE mg/dL
Ketones, ur: NEGATIVE mg/dL
Protein, ur: NEGATIVE mg/dL
Protein, ur: NEGATIVE mg/dL
Urobilinogen, UA: 1 mg/dL (ref 0.0–1.0)

## 2010-08-10 LAB — DIFFERENTIAL
Basophils Absolute: 0.1 10*3/uL (ref 0.0–0.1)
Basophils Absolute: 0 10*3/uL (ref 0.0–0.1)
Eosinophils Relative: 1 % (ref 0–5)
Lymphocytes Relative: 17 % (ref 12–46)
Lymphs Abs: 1.3 10*3/uL (ref 0.7–4.0)
Monocytes Absolute: 0.4 10*3/uL (ref 0.1–1.0)
Monocytes Absolute: 0.4 10*3/uL (ref 0.1–1.0)
Neutro Abs: 5.6 10*3/uL (ref 1.7–7.7)
Neutrophils Relative %: 73 % (ref 43–77)

## 2010-08-10 LAB — PROTIME-INR
Prothrombin Time: 13.5 seconds (ref 11.6–15.2)
Prothrombin Time: 14.4 seconds (ref 11.6–15.2)

## 2010-08-10 LAB — COMPREHENSIVE METABOLIC PANEL
ALT: 15 U/L (ref 0–35)
Albumin: 3.9 g/dL (ref 3.5–5.2)
BUN: 10 mg/dL (ref 6–23)
CO2: 21 mEq/L (ref 19–32)
Calcium: 9.3 mg/dL (ref 8.4–10.5)
Calcium: 8.6 mg/dL (ref 8.4–10.5)
Chloride: 101 mEq/L (ref 96–112)
Creatinine, Ser: 0.68 mg/dL (ref 0.4–1.2)
Creatinine, Ser: 0.76 mg/dL (ref 0.4–1.2)
GFR calc non Af Amer: >60 mL/min (ref >60)
Glucose, Bld: 87 mg/dL (ref 70–99)
Sodium: 135 mEq/L (ref 135–145)
Total Bilirubin: 0.8 mg/dL (ref 0.3–1.2)

## 2010-08-10 LAB — APTT: aPTT: 34 seconds (ref 24–37)

## 2010-08-24 LAB — POCT I-STAT, CHEM 8
Calcium, Ion: 1.18 mmol/L (ref 1.12–1.32)
Hemoglobin: 11.9 g/dL — ABNORMAL LOW (ref 12.0–15.0)
Sodium: 142 mEq/L (ref 135–145)
TCO2: 23 mmol/L (ref 0–100)

## 2010-08-24 LAB — CBC
MCHC: 32.4 g/dL (ref 30.0–36.0)
MCV: 80.2 fL (ref 78.0–100.0)
Platelets: 201 10*3/uL (ref 150–400)
RDW: 22.9 % — ABNORMAL HIGH (ref 11.5–15.5)

## 2010-08-24 LAB — POCT CARDIAC MARKERS
CKMB, poc: <1 ng/mL — ABNORMAL LOW (ref 1.0–8.0)
Myoglobin, poc: 25 ng/mL (ref 12–200)
Troponin i, poc: <0.05 ng/mL (ref 0.00–0.09)

## 2010-08-24 LAB — DIFFERENTIAL
Basophils Absolute: 0 10*3/uL (ref 0.0–0.1)
Lymphs Abs: 0.9 10*3/uL (ref 0.7–4.0)
Monocytes Absolute: 0.4 10*3/uL (ref 0.1–1.0)

## 2010-08-28 LAB — BASIC METABOLIC PANEL
BUN: 10 mg/dL (ref 6–23)
BUN: 10 mg/dL (ref 6–23)
BUN: 12 mg/dL (ref 6–23)
BUN: 14 mg/dL (ref 6–23)
BUN: 6 mg/dL (ref 6–23)
BUN: 9 mg/dL (ref 6–23)
CO2: 24 mEq/L (ref 19–32)
CO2: 25 mEq/L (ref 19–32)
CO2: 25 mEq/L (ref 19–32)
CO2: 27 mEq/L (ref 19–32)
CO2: 30 mEq/L (ref 19–32)
Calcium: 8.3 mg/dL — ABNORMAL LOW (ref 8.4–10.5)
Chloride: 105 mEq/L (ref 96–112)
Chloride: 106 mEq/L (ref 96–112)
Chloride: 106 mEq/L (ref 96–112)
Chloride: 106 mEq/L (ref 96–112)
Chloride: 109 mEq/L (ref 96–112)
Chloride: 99 mEq/L (ref 96–112)
Creatinine, Ser: 0.64 mg/dL (ref 0.4–1.2)
Creatinine, Ser: 0.88 mg/dL (ref 0.4–1.2)
Creatinine, Ser: 1.1 mg/dL (ref 0.4–1.2)
Creatinine, Ser: 1.1 mg/dL (ref 0.4–1.2)
Creatinine, Ser: 1.12 mg/dL (ref 0.4–1.2)
Creatinine, Ser: 1.16 mg/dL (ref 0.4–1.2)
Creatinine, Ser: 1.24 mg/dL — ABNORMAL HIGH (ref 0.4–1.2)
GFR calc Af Amer: 56 mL/min — ABNORMAL LOW (ref >60)
GFR calc Af Amer: 57 mL/min — ABNORMAL LOW (ref >60)
GFR calc Af Amer: >60 mL/min (ref >60)
GFR calc non Af Amer: 46 mL/min — ABNORMAL LOW (ref >60)
GFR calc non Af Amer: 47 mL/min — ABNORMAL LOW (ref >60)
GFR calc non Af Amer: >60 mL/min (ref >60)
GFR calc non Af Amer: >60 mL/min (ref >60)
Glucose, Bld: 111 mg/dL — ABNORMAL HIGH (ref 70–99)
Glucose, Bld: 112 mg/dL — ABNORMAL HIGH (ref 70–99)
Glucose, Bld: 88 mg/dL (ref 70–99)
Glucose, Bld: 97 mg/dL (ref 70–99)
Potassium: 3 mEq/L — ABNORMAL LOW (ref 3.5–5.1)
Potassium: 3.4 mEq/L — ABNORMAL LOW (ref 3.5–5.1)
Potassium: 3.9 mEq/L (ref 3.5–5.1)
Potassium: 3.9 mEq/L (ref 3.5–5.1)
Potassium: 4 mEq/L (ref 3.5–5.1)
Potassium: 4.3 mEq/L (ref 3.5–5.1)
Potassium: 4.6 mEq/L (ref 3.5–5.1)
Potassium: 4.8 mEq/L (ref 3.5–5.1)
Sodium: 134 mEq/L — ABNORMAL LOW (ref 135–145)
Sodium: 137 mEq/L (ref 135–145)
Sodium: 138 mEq/L (ref 135–145)

## 2010-08-28 LAB — URINE CULTURE: Colony Count: >=100000

## 2010-08-28 LAB — POCT I-STAT 4, (NA,K, GLUC, HGB,HCT)
Glucose, Bld: 121 mg/dL — ABNORMAL HIGH (ref 70–99)
Glucose, Bld: 129 mg/dL — ABNORMAL HIGH (ref 70–99)
Glucose, Bld: 129 mg/dL — ABNORMAL HIGH (ref 70–99)
Glucose, Bld: 130 mg/dL — ABNORMAL HIGH (ref 70–99)
Glucose, Bld: 136 mg/dL — ABNORMAL HIGH (ref 70–99)
Glucose, Bld: 73 mg/dL (ref 70–99)
HCT: 22 % — ABNORMAL LOW (ref 36.0–46.0)
HCT: 26 % — ABNORMAL LOW (ref 36.0–46.0)
HCT: 31 % — ABNORMAL LOW (ref 36.0–46.0)
HCT: 37 % (ref 36.0–46.0)
Hemoglobin: 10.5 g/dL — ABNORMAL LOW (ref 12.0–15.0)
Hemoglobin: 7.5 g/dL — CL (ref 12.0–15.0)
Hemoglobin: 8.8 g/dL — ABNORMAL LOW (ref 12.0–15.0)
Potassium: 2.9 mEq/L — ABNORMAL LOW (ref 3.5–5.1)
Potassium: 3.5 mEq/L (ref 3.5–5.1)
Potassium: 3.9 mEq/L (ref 3.5–5.1)
Potassium: 4.8 mEq/L (ref 3.5–5.1)
Sodium: 137 mEq/L (ref 135–145)
Sodium: 140 mEq/L (ref 135–145)
Sodium: 140 mEq/L (ref 135–145)

## 2010-08-28 LAB — CBC
HCT: 27 % — ABNORMAL LOW (ref 36.0–46.0)
HCT: 27.3 % — ABNORMAL LOW (ref 36.0–46.0)
HCT: 28.2 % — ABNORMAL LOW (ref 36.0–46.0)
HCT: 28.2 % — ABNORMAL LOW (ref 36.0–46.0)
HCT: 28.5 % — ABNORMAL LOW (ref 36.0–46.0)
Hemoglobin: 9.1 g/dL — ABNORMAL LOW (ref 12.0–15.0)
Hemoglobin: 9.5 g/dL — ABNORMAL LOW (ref 12.0–15.0)
MCHC: 33.1 g/dL (ref 30.0–36.0)
MCHC: 33.1 g/dL (ref 30.0–36.0)
MCHC: 33.2 g/dL (ref 30.0–36.0)
MCHC: 33.4 g/dL (ref 30.0–36.0)
MCV: 85 fL (ref 78.0–100.0)
MCV: 85.1 fL (ref 78.0–100.0)
MCV: 85.6 fL (ref 78.0–100.0)
MCV: 86 fL (ref 78.0–100.0)
MCV: 86.3 fL (ref 78.0–100.0)
MCV: 86.3 fL (ref 78.0–100.0)
MCV: 86.5 fL (ref 78.0–100.0)
Platelets: 106 10*3/uL — ABNORMAL LOW (ref 150–400)
Platelets: 135 10*3/uL — ABNORMAL LOW (ref 150–400)
Platelets: 209 10*3/uL (ref 150–400)
Platelets: 91 10*3/uL — ABNORMAL LOW (ref 150–400)
Platelets: DECREASED 10*3/uL (ref 150–400)
RBC: 3.16 MIL/uL — ABNORMAL LOW (ref 3.87–5.11)
RBC: 3.17 MIL/uL — ABNORMAL LOW (ref 3.87–5.11)
RBC: 3.22 MIL/uL — ABNORMAL LOW (ref 3.87–5.11)
RBC: 3.3 MIL/uL — ABNORMAL LOW (ref 3.87–5.11)
RBC: 3.31 MIL/uL — ABNORMAL LOW (ref 3.87–5.11)
RBC: 3.32 MIL/uL — ABNORMAL LOW (ref 3.87–5.11)
RDW: 15.7 % — ABNORMAL HIGH (ref 11.5–15.5)
RDW: 16.4 % — ABNORMAL HIGH (ref 11.5–15.5)
RDW: 16.5 % — ABNORMAL HIGH (ref 11.5–15.5)
RDW: 16.6 % — ABNORMAL HIGH (ref 11.5–15.5)
WBC: 7.7 10*3/uL (ref 4.0–10.5)
WBC: 8.6 10*3/uL (ref 4.0–10.5)

## 2010-08-28 LAB — URINALYSIS, ROUTINE W REFLEX MICROSCOPIC
Ketones, ur: NEGATIVE mg/dL
Nitrite: POSITIVE — AB
Specific Gravity, Urine: 1.021 (ref 1.005–1.030)
Urobilinogen, UA: 1 mg/dL (ref 0.0–1.0)
pH: 6.5 (ref 5.0–8.0)

## 2010-08-28 LAB — PREPARE FRESH FROZEN PLASMA

## 2010-08-28 LAB — POCT I-STAT 3, ART BLOOD GAS (G3+)
Acid-base deficit: 6 mmol/L — ABNORMAL HIGH (ref 0.0–2.0)
O2 Saturation: 97 %
Patient temperature: 37.8
Patient temperature: 37.8
TCO2: 21 mmol/L (ref 0–100)
TCO2: 21 mmol/L (ref 0–100)
TCO2: 25 mmol/L (ref 0–100)
pCO2 arterial: 30.7 mmHg — ABNORMAL LOW (ref 35.0–45.0)
pCO2 arterial: 33.5 mmHg — ABNORMAL LOW (ref 35.0–45.0)
pCO2 arterial: 35.2 mmHg (ref 35.0–45.0)
pH, Arterial: 7.37 (ref 7.350–7.400)
pH, Arterial: 7.404 — ABNORMAL HIGH (ref 7.350–7.400)
pH, Arterial: 7.432 — ABNORMAL HIGH (ref 7.350–7.400)

## 2010-08-28 LAB — GLUCOSE, CAPILLARY
Glucose-Capillary: 113 mg/dL — ABNORMAL HIGH (ref 70–99)
Glucose-Capillary: 114 mg/dL — ABNORMAL HIGH (ref 70–99)
Glucose-Capillary: 121 mg/dL — ABNORMAL HIGH (ref 70–99)
Glucose-Capillary: 134 mg/dL — ABNORMAL HIGH (ref 70–99)
Glucose-Capillary: 140 mg/dL — ABNORMAL HIGH (ref 70–99)
Glucose-Capillary: 66 mg/dL — ABNORMAL LOW (ref 70–99)
Glucose-Capillary: 88 mg/dL (ref 70–99)
Glucose-Capillary: 92 mg/dL (ref 70–99)
Glucose-Capillary: 97 mg/dL (ref 70–99)

## 2010-08-28 LAB — URINE MICROSCOPIC-ADD ON

## 2010-08-28 LAB — POCT I-STAT, CHEM 8
BUN: 10 mg/dL (ref 6–23)
BUN: 7 mg/dL (ref 6–23)
Calcium, Ion: 1.22 mmol/L (ref 1.12–1.32)
Chloride: 106 mEq/L (ref 96–112)
Chloride: 111 mEq/L (ref 96–112)
Creatinine, Ser: 0.6 mg/dL (ref 0.4–1.2)
Glucose, Bld: 118 mg/dL — ABNORMAL HIGH (ref 70–99)
Sodium: 143 mEq/L (ref 135–145)

## 2010-08-28 LAB — BRAIN NATRIURETIC PEPTIDE
Pro B Natriuretic peptide (BNP): 1351 pg/mL — ABNORMAL HIGH (ref 0.0–100.0)
Pro B Natriuretic peptide (BNP): 1785 pg/mL — ABNORMAL HIGH (ref 0.0–100.0)

## 2010-08-28 LAB — MAGNESIUM: Magnesium: 2.7 mg/dL — ABNORMAL HIGH (ref 1.5–2.5)

## 2010-08-28 LAB — PREPARE PLATELETS

## 2010-08-28 LAB — PLATELET COUNT: Platelets: 110 10*3/uL — ABNORMAL LOW (ref 150–400)

## 2010-08-28 LAB — PROTIME-INR
INR: 2 — ABNORMAL HIGH (ref 0.00–1.49)
Prothrombin Time: 22.2 seconds — ABNORMAL HIGH (ref 11.6–15.2)

## 2010-08-28 LAB — CREATININE, SERUM: GFR calc Af Amer: >60 mL/min (ref >60)

## 2010-08-28 LAB — HEMOGLOBIN AND HEMATOCRIT, BLOOD
HCT: 23.5 % — ABNORMAL LOW (ref 36.0–46.0)
Hemoglobin: 7.9 g/dL — CL (ref 12.0–15.0)

## 2010-08-29 LAB — CBC
HCT: 35 % — ABNORMAL LOW (ref 36.0–46.0)
HCT: 33 % — ABNORMAL LOW (ref 36.0–46.0)
HCT: 35.2 % — ABNORMAL LOW (ref 36.0–46.0)
HCT: 37.7 % (ref 36.0–46.0)
Hemoglobin: 11.9 g/dL — ABNORMAL LOW (ref 12.0–15.0)
Hemoglobin: 11 g/dL — ABNORMAL LOW (ref 12.0–15.0)
Hemoglobin: 11.6 g/dL — ABNORMAL LOW (ref 12.0–15.0)
Hemoglobin: 12.4 g/dL (ref 12.0–15.0)
MCHC: 33.2 g/dL (ref 30.0–36.0)
MCHC: 33.3 g/dL (ref 30.0–36.0)
MCHC: 32.8 g/dL (ref 30.0–36.0)
MCV: 86 fL (ref 78.0–100.0)
MCV: 85.9 fL (ref 78.0–100.0)
MCV: 86.1 fL (ref 78.0–100.0)
MCV: 86.9 fL (ref 78.0–100.0)
Platelets: 187 10*3/uL (ref 150–400)
Platelets: 201 10*3/uL (ref 150–400)
Platelets: 174 10*3/uL (ref 150–400)
Platelets: 252 10*3/uL (ref 150–400)
RBC: 4.36 MIL/uL (ref 3.87–5.11)
RDW: 16.7 % — ABNORMAL HIGH (ref 11.5–15.5)
RDW: 17.5 % — ABNORMAL HIGH (ref 11.5–15.5)
RDW: 17.6 % — ABNORMAL HIGH (ref 11.5–15.5)
RDW: 18.2 % — ABNORMAL HIGH (ref 11.5–15.5)
WBC: 5.9 10*3/uL (ref 4.0–10.5)
WBC: 6.5 10*3/uL (ref 4.0–10.5)
WBC: 7.6 10*3/uL (ref 4.0–10.5)

## 2010-08-29 LAB — BASIC METABOLIC PANEL
BUN: 8 mg/dL (ref 6–23)
BUN: 12 mg/dL (ref 6–23)
BUN: 14 mg/dL (ref 6–23)
BUN: 22 mg/dL (ref 6–23)
CO2: 24 mEq/L (ref 19–32)
CO2: 27 mEq/L (ref 19–32)
CO2: 27 mEq/L (ref 19–32)
CO2: 28 mEq/L (ref 19–32)
Calcium: 8.9 mg/dL (ref 8.4–10.5)
Chloride: 106 mEq/L (ref 96–112)
Chloride: 102 mEq/L (ref 96–112)
Chloride: 104 mEq/L (ref 96–112)
Chloride: 107 mEq/L (ref 96–112)
Chloride: 108 mEq/L (ref 96–112)
Creatinine, Ser: 0.87 mg/dL (ref 0.4–1.2)
Creatinine, Ser: 0.81 mg/dL (ref 0.4–1.2)
Creatinine, Ser: 0.93 mg/dL (ref 0.4–1.2)
GFR calc Af Amer: >60 mL/min (ref >60)
GFR calc Af Amer: >60 mL/min (ref >60)
GFR calc non Af Amer: 56 mL/min — ABNORMAL LOW (ref >60)
Glucose, Bld: 99 mg/dL (ref 70–99)
Glucose, Bld: 101 mg/dL — ABNORMAL HIGH (ref 70–99)
Glucose, Bld: 107 mg/dL — ABNORMAL HIGH (ref 70–99)
Glucose, Bld: 122 mg/dL — ABNORMAL HIGH (ref 70–99)
Glucose, Bld: 91 mg/dL (ref 70–99)
Potassium: 3.9 mEq/L (ref 3.5–5.1)
Potassium: 3.2 mEq/L — ABNORMAL LOW (ref 3.5–5.1)
Potassium: 3.3 mEq/L — ABNORMAL LOW (ref 3.5–5.1)
Potassium: 4.5 mEq/L (ref 3.5–5.1)
Potassium: 5.7 mEq/L — ABNORMAL HIGH (ref 3.5–5.1)
Sodium: 135 mEq/L (ref 135–145)
Sodium: 137 mEq/L (ref 135–145)
Sodium: 140 mEq/L (ref 135–145)
Sodium: 141 mEq/L (ref 135–145)

## 2010-08-29 LAB — TYPE AND SCREEN: Antibody Screen: NEGATIVE

## 2010-08-29 LAB — POCT I-STAT 3, ART BLOOD GAS (G3+)
Acid-Base Excess: 1 mmol/L (ref 0.0–2.0)
pCO2 arterial: 58.9 mmHg (ref 35.0–45.0)
pH, Arterial: 7.294 — ABNORMAL LOW (ref 7.350–7.400)
pO2, Arterial: 28 mmHg — CL (ref 80.0–100.0)
pO2, Arterial: 87 mmHg (ref 80.0–100.0)

## 2010-08-29 LAB — BLOOD GAS, ARTERIAL
Acid-base deficit: 0.5 mmol/L (ref 0.0–2.0)
Drawn by: 206361
O2 Saturation: 97.1 %
TCO2: 24.9 mmol/L (ref 0–100)
pCO2 arterial: 38.9 mmHg (ref 35.0–45.0)

## 2010-08-29 LAB — LIPID PANEL
HDL: 30 mg/dL — ABNORMAL LOW (ref >39)
Total CHOL/HDL Ratio: 6.3 RATIO

## 2010-08-29 LAB — PROTIME-INR
INR: 1.1 (ref 0.00–1.49)
INR: 1 (ref 0.00–1.49)
Prothrombin Time: 13.7 seconds (ref 11.6–15.2)

## 2010-08-29 LAB — HEPATIC FUNCTION PANEL
ALT: 9 U/L (ref 0–35)
Alkaline Phosphatase: 78 U/L (ref 39–117)
Indirect Bilirubin: 0.5 mg/dL (ref 0.3–0.9)
Total Protein: 7.7 g/dL (ref 6.0–8.3)

## 2010-08-29 LAB — COMPREHENSIVE METABOLIC PANEL
ALT: 8 U/L (ref 0–35)
Albumin: 3.8 g/dL (ref 3.5–5.2)
BUN: 12 mg/dL (ref 6–23)
Calcium: 9.5 mg/dL (ref 8.4–10.5)
Glucose, Bld: 97 mg/dL (ref 70–99)
Sodium: 140 mEq/L (ref 135–145)
Total Protein: 7 g/dL (ref 6.0–8.3)

## 2010-08-29 LAB — URINALYSIS, ROUTINE W REFLEX MICROSCOPIC
Bilirubin Urine: NEGATIVE
Ketones, ur: NEGATIVE mg/dL
Nitrite: POSITIVE — AB
Urobilinogen, UA: 0.2 mg/dL (ref 0.0–1.0)

## 2010-08-29 LAB — HEMOGLOBIN A1C
Hgb A1c MFr Bld: 5.4 % (ref 4.6–6.1)
Mean Plasma Glucose: 108 mg/dL

## 2010-08-29 LAB — BRAIN NATRIURETIC PEPTIDE: Pro B Natriuretic peptide (BNP): 421 pg/mL — ABNORMAL HIGH (ref 0.0–100.0)

## 2010-08-29 LAB — APTT: aPTT: 34 seconds (ref 24–37)

## 2010-09-06 LAB — CBC
HCT: 26.4 % — ABNORMAL LOW (ref 36.0–46.0)
HCT: 32.7 % — ABNORMAL LOW (ref 36.0–46.0)
Hemoglobin: 7 g/dL — CL (ref 12.0–15.0)
Hemoglobin: 7.6 g/dL — CL (ref 12.0–15.0)
Platelets: 318 10*3/uL (ref 150–400)
RBC: 4.24 MIL/uL (ref 3.87–5.11)
RBC: 4.5 MIL/uL (ref 3.87–5.11)
RBC: 4.86 MIL/uL (ref 3.87–5.11)
RBC: 5.05 MIL/uL (ref 3.87–5.11)
WBC: 10.3 10*3/uL (ref 4.0–10.5)
WBC: 8.3 10*3/uL (ref 4.0–10.5)
WBC: 8.6 10*3/uL (ref 4.0–10.5)
WBC: 9.7 10*3/uL (ref 4.0–10.5)

## 2010-09-06 LAB — POCT CARDIAC MARKERS
CKMB, poc: 1.2 ng/mL (ref 1.0–8.0)
Myoglobin, poc: 58.8 ng/mL (ref 12–200)
Troponin i, poc: <0.05 ng/mL (ref 0.00–0.09)

## 2010-09-06 LAB — CROSSMATCH

## 2010-09-06 LAB — BASIC METABOLIC PANEL
BUN: 13 mg/dL (ref 6–23)
CO2: 17 mEq/L — ABNORMAL LOW (ref 19–32)
CO2: 20 mEq/L (ref 19–32)
Calcium: 8.1 mg/dL — ABNORMAL LOW (ref 8.4–10.5)
Calcium: 8.1 mg/dL — ABNORMAL LOW (ref 8.4–10.5)
Calcium: 8.5 mg/dL (ref 8.4–10.5)
Chloride: 111 mEq/L (ref 96–112)
Chloride: 111 mEq/L (ref 96–112)
Creatinine, Ser: 0.86 mg/dL (ref 0.4–1.2)
Creatinine, Ser: 0.93 mg/dL (ref 0.4–1.2)
GFR calc Af Amer: >60 mL/min (ref >60)
GFR calc Af Amer: >60 mL/min (ref >60)
GFR calc Af Amer: >60 mL/min (ref >60)
GFR calc Af Amer: >60 mL/min (ref >60)
GFR calc non Af Amer: >60 mL/min (ref >60)
GFR calc non Af Amer: >60 mL/min (ref >60)
GFR calc non Af Amer: >60 mL/min (ref >60)
Glucose, Bld: 86 mg/dL (ref 70–99)
Potassium: 3.3 mEq/L — ABNORMAL LOW (ref 3.5–5.1)
Sodium: 137 mEq/L (ref 135–145)
Sodium: 138 mEq/L (ref 135–145)

## 2010-09-06 LAB — PROTIME-INR
INR: 1.2 (ref 0.00–1.49)
Prothrombin Time: 15.1 seconds (ref 11.6–15.2)

## 2010-09-06 LAB — HEMOGLOBIN AND HEMATOCRIT, BLOOD
HCT: 34.7 % — ABNORMAL LOW (ref 36.0–46.0)
Hemoglobin: 10.5 g/dL — ABNORMAL LOW (ref 12.0–15.0)

## 2010-09-06 LAB — DIGOXIN LEVEL: Digoxin Level: <0.2 ng/mL — ABNORMAL LOW (ref 0.8–2.0)

## 2010-09-06 LAB — DIFFERENTIAL
Band Neutrophils: 0 % (ref 0–10)
Basophils Absolute: 0 10*3/uL (ref 0.0–0.1)
Basophils Relative: 0 % (ref 0–1)
Basophils Relative: 0 % (ref 0–1)
Eosinophils Absolute: 0.1 10*3/uL (ref 0.0–0.7)
Eosinophils Relative: 1 % (ref 0–5)
Lymphocytes Relative: 21 % (ref 12–46)
Lymphocytes Relative: 21 % (ref 12–46)
Lymphs Abs: 2 10*3/uL (ref 0.7–4.0)
Monocytes Absolute: 0.3 10*3/uL (ref 0.1–1.0)
Monocytes Relative: 3 % (ref 3–12)
Monocytes Relative: 7 % (ref 3–12)
Neutro Abs: 5.9 10*3/uL (ref 1.7–7.7)
Neutro Abs: 7.4 10*3/uL (ref 1.7–7.7)
Neutrophils Relative %: 71 % (ref 43–77)
Neutrophils Relative %: 76 % (ref 43–77)

## 2010-09-06 LAB — TSH: TSH: 2.31 u[IU]/mL (ref 0.350–4.500)

## 2010-09-06 LAB — LIPID PANEL
Cholesterol: 149 mg/dL (ref 0–200)
Total CHOL/HDL Ratio: 4.5 RATIO

## 2010-09-06 LAB — APTT: aPTT: 31 seconds (ref 24–37)

## 2010-09-06 LAB — POCT PREGNANCY, URINE: Preg Test, Ur: NEGATIVE

## 2010-09-21 HISTORY — PX: TRANSTHORACIC ECHOCARDIOGRAM: SHX275

## 2010-10-05 NOTE — H&P (Signed)
NAMELITITIA, SEN NO.:  [id number]   MEDICAL RECORD NO.:  [id number]          PATIENT TYPE:  INP   LOCATION:  4705                         FACILITY:  [facility]   PHYSICIAN:  Acey Lav, MD  DATE OF BIRTH:  27-Mar-1961   DATE OF ADMISSION:  06/13/2008  DATE OF DISCHARGE:                              HISTORY & PHYSICAL   PRIMARY CARE PHYSICIAN:  Dr. Lorelle Formosa.   CHIEF COMPLAINT:  Menorrhagia, also with dyspnea.   HISTORY OF PRESENT ILLNESS:  Ms. Clites is a 50 year old African  American lady with Takotsubo cardiomyopathy, hypertension, and asthma  who has had a history for more than a year of heavy menses.  She will  menstruate for approximately a week and had quite heavy menses.  She  wears Depends around a pad.  She changes her pads on a good day 6-7  times a day and on a bad day greater than 11 times per day.  She  presented to the emergency department with dizziness, lightheadedness  and heavy menses, now with blood clots passing from her uterus.  She has  been evaluated by OB/GYN and has had a Pap smear under anesthesia, as  well as an endometrial biopsy which failed to show any evidence of  malignancy.  She is currently not on any hormones, such as oral  contraceptives to stop her bleeding and, in fact, she is on daily  aspirin.  She was seen at the [facility] and found to have a  hemoglobin this morning of 7.6.  She was referred to the emergency  department where her hemoglobin fell to 7.  A transfusion with packed  red blood cells was begun and we were called to admit the patient to  [facility] due to her having comorbidities of heart  failure and need for diuresis with her blood transfusions.   PAST MEDICAL HISTORY:  1. As mentioned, Takotsubo cardiomyopathy.  2. Hypertension.  3. Asthma.   PAST SURGICAL HISTORY:  She has had the pelvic exam under anesthesia.   SOCIAL HISTORY:  She denies smoking, denies  drinking, denies using  intravenous drugs.   FAMILY HISTORY:  Her sister also has cardiomyopathy.   REVIEW OF SYSTEMS:  As noted in the history of present illness;  menorrhagia, dizziness, lightheadedness, dyspnea, but no chest pain.  No  fevers, no chills, no weight loss.   Otherwise 10-point review of systems are negative.   CURRENT MEDICATIONS:  1. Lasix 40 mg daily.  2. Avapro 150 mg daily.  3. Digoxin 0.125 mg daily.  4. Potassium chloride 20 mEq once daily.  5. Carvedilol 6.25 mg twice daily.  6. Ferrous sulfate 325 mg twice daily.  7. Albuterol metered-dose inhaler 2 puffs q.6 h., p.r.n.  8. Flovent 220 mcg 2 puffs twice daily scheduled.   ALLERGIES:  NO KNOWN DRUG ALLERGIES.   PHYSICAL EXAMINATION:  VITAL SIGNS:  Blood pressure was 111/55, pulse  80, respirations 19.  Pulse ox 100% on room air.  Temperature was 98.4.  GENERAL:  A pleasant lady, slightly overweight.  NECK:  She has thickening  around her neck, especially in her right neck.  She has a prominent mass on her right neck which she claims has been  there since use was in her teenage years.  She also has one as well on  the left side of her neck.  I do not appreciate jugulovenous distention,  although the exam was slightly compromised by her neck masses.  HEENT:  Her pupils are equal, round and reactive to light.  Sclerae  anicteric.  Oropharynx is clear.  CARDIOVASCULAR:  Revealed a regular rate with no murmurs, but an S3.  LUNGS:  Actually clear to auscultation on my exam  ABDOMEN:  Soft, nontender, nondistended.  EXTREMITIES:  With trace edema.   LABORATORY DATA:  Showed urine pregnancy test screen which was negative.  CBC this morning that showed a hemoglobin 7.6.  Repeat CBC at 1400 hours  showed a hemoglobin of 7 with hematocrit of 24.7, white count of 8.3 and  platelets of 337.  Her metabolic panel at 2:30, showed a sodium of 138,  potassium 3.5, chloride 111, bicarbonate 17, BUN and creatinine of 9  and  0.81, calcium 8.1.  Her magnesium was 2.2.  Her beta natriuretic peptide  was 415.  Her Digoxin level was less than 2.   CT angiogram of the chest showed no acute pulmonary emboli or thoracic  aortic aneurysm.  It did show cardiomegaly.  There were no enlarged  lymph nodes, no pericardial or pleural effusions.  There were some mild  basilar atelectasis, scarring and minimal left apical scarring.  Chest x-  ray showed cardiomegaly without heart failure or pneumonia.  There was  as a suspicion of a substernal thyroid goiter.   EKG has not yet been done when I initially examined the patient.   ASSESSMENT/PLAN:  This is a 50 year old African American lady with  Takotsubo cardiomyopathy, menorrhagia on aspirin, but not on oral  contraceptives who presents with heavy menses, dizziness,  lightheadedness and a hemoglobin that has dropped 7.   1. Menorrhagia:  The patient is being transfused 2 units of packed red      blood cells and has been given Lasix.  We will recheck her      hemoglobin after an hour after this transfusion.  I am giving her      Premarin 2.5 mg four times daily, and I will continue her on iron      sulfate.  I am stopping her aspirin.  I think that she will need      some type of oral contraception or estrogen to control her bleeding      both acutely and chronically.  We need to contact gynecology so      they can see her and determine a good plan of action for her.  She      may benefit from a total abdominal hysterectomy.  2. History of congestive heart failure as described above.  We will      continue her on her home medications, but use care not to over      diurese her.  She does not seem to be in failure at this point in      time.  3. Asthma.  Will continue her on albuterol.  4. Prophylaxis.  I am placing the patient on SCDs and avoiding      heparin.  5. Health care scrreening: Check HIV test.   CODE STATUS:  The patient is a full code.  Acey Lav, MD  Electronically Signed     CV/MEDQ  D:  06/13/2008  T:  06/14/2008  Job:  [id number]

## 2010-10-05 NOTE — Consult Note (Signed)
[name], [name] NO.:  [id number]   MEDICAL RECORD NO.:  [id number]          PATIENT TYPE:  OIB   LOCATION:  3731                         FACILITY:  [facility]   PHYSICIAN:  Sheliah Plane, MD    DATE OF BIRTH:  31-Jan-1961   DATE OF CONSULTATION:  11/27/2008  DATE OF DISCHARGE:                                 CONSULTATION   REQUESTING PHYSICIAN:  Sheliah Mends, MD   FOLLOWUP CARDIOLOGIST:  Sheliah Mends, MD   PRIMARY CARE PHYSICIAN:  Dr. Debroah Baller.   REASON FOR CONSULTATION:  Congestive heart failure with pulmonary  hypertension; mitral regurgitation; and aortic insufficiency, chronic.   HISTORY OF PRESENT ILLNESS:  The patient is a 50 year old female who was  admitted for elective evaluation of severe mitral regurgitation.  She  has a known history of cardiomyopathy and history of asthma.  She has  been evaluated by Dr. Debroah Baller and Dr. Darnell Level for consideration of  thyroid surgery.  Thyroid biopsy, the patient reports was nondiagnostic.  While being evaluated for possible surgery, she noted that she had been  having some increasing shortness of breath and echocardiogram was  performed, which showed depressed left ventricular function with severe  mitral regurgitation and dilated atrium and evidence of pulmonary  hypertension with peak pulmonary pressures of 73.   The patient has had a history of known cardiomyopathy and has been  treated for congestive heart failure for at least 4-5 years.  She was  diagnosed by Dr. Alanda Amass with takotsubo cardiomyopathy in 2007 and had  an ejection fraction of 30-35%.  At that time, she had known severe  mitral regurgitation.   The patient notes that her symptoms have been slowly progressing with  increasing shortness of breath especially with exertion and 2-pillow  orthopnea over the past several months.   She has no previous history of myocardial infarction or previous cardiac  surgery.  She does have a history of  hypertension.  Denies diabetes.  Lipid status is unknown.  She is a remote smoker, but quit 2 years ago  after smoking for 15-20 years.  She has had no previous stroke.  Denies  claudication.  Has no history of renal insufficiency.   PAST MEDICAL HISTORY:  Nonischemic cardiomyopathy with baseline ejection  fraction 30-35%, history of diagnosis of takotsubo cardiomyopathy  diagnosed in 2007, history of severe mitral regurgitation, history of  severe pulmonary hypertension, history of fibroid uterus with  menorrhagia, severe in February 2009, thyroid mass of unknown etiology.   PAST SURGICAL HISTORY:  As noted above.  The vaginal bleeding was  treated medically.  The patient had a needle biopsy of the thyroid that  was nondiagnostic, but no thyroid surgery has been performed.   SOCIAL HISTORY:  The patient is single, lives with her son and daughter,  unemployed.  She applied for disability, but was denied.  Denies alcohol  use.   MEDICATIONS:  At the time of this admission, she was on,  1. Lasix 60 mg a day.  2. Lisinopril 10 mg twice a day.  3. Coreg 3.125 twice a day.  4. Spironolactone 25  a day.  5. Potassium 20 mEq a day.  6. Aspirin 81 mg a day.  7. Ventolin  inhaler 2 puffs as needed.  8. Flovent 1 puff as needed.   FAMILY HISTORY:  The patient's mother died of coronary artery disease  and stroke.  The patient's father had a history of seizures.   ALLERGIES:  VICODIN, which causes rash.   REVIEW OF SYSTEMS:  Positive for chest pain, palpitation, exertional  shortness of breath, 2 to 3-pillow orthopnea, and some dizziness.  Denies syncope.  General review of systems, the patient denies fever,  chills, or night sweats.  She does complain of feeling hot frequently.  She denies any blood in her stool.  She has had vaginal bleeding as  noted above, but none since treated at [facility] on February of  2009.  She denies psychiatric history.   PHYSICAL EXAMINATION:  VITAL  SIGNS:  Blood pressure is 140/80.  The  patient is in sinus rhythm.  She is 5 feet tall and 142 pounds.  GENERAL:  She is awake and alert, neurologically intact.  She has a very  poor dentition with multiple broken teeth.  NECK:  She has no carotid bruits.  LUNGS:  Clear bilaterally without wheezing.  CARDIAC:  She has a 3/6 systolic murmur heard at the apex radiating to  the axilla.  ABDOMEN:  Nontender without palpable masses.  Liver was not palpably  enlarged pulsatile.  She has trace pedal edema, 2+ DP and PT pulses  bilaterally.   The patient has had a cardiac catheterization, which shows patent  coronary arteries without significant coronary disease.  She had also  undergone PA pressure.  At the time of right heart catheterization, PA  pressure was 62/26 with a wedge of 35, diffusely decreased LV function  with ejection fraction of 40%.  Transesophageal echo was also performed  that shows severe mitral regurgitation, probably related to restriction  of the posterior liquid.  In addition, she has at least moderate aortic  insufficiency.   IMPRESSION:  1. The patient with progressive over several years of congestive heart      failure with severe mitral regurgitation and depressed left      ventricular function and evidence of pulmonary hypertension and at      least moderate aortic insufficiency.  2. Poor dentition.  3. History of asthma, question bowel related.   SUGGESTIONS:  At this point, the patient is clinically stable and with  controlled symptoms.  We would recommend Panorex and dental consult to  evaluate her and treat her dental situation to prevent any current or  future bouts.  Without coronary artery disease, she may be a candidate  for a mini right thoracotomy approach to mitral valve repair.  However,  this may be limited by her degree of aortic insufficiency.  We will  discuss the case with Dr.  Cornelius Moras, who could arrange in the next several weeks, mitral valve  repair  through right thoracotomy.  This would be pending her dental evaluation.  I have reviewed with the patient the need for close and persistent  medical followup and to keep her appointments as outlined for her, which  she is willing to do.      Sheliah Plane, MD  Electronically Signed     EG/MEDQ  D:  11/27/2008  T:  11/28/2008  Job:  [id number]   cc:   [name], M.D.  Sheliah Mends, MD  Salvatore Decent. Cornelius Moras, M.D.

## 2010-10-05 NOTE — H&P (Signed)
[name], [name] NO.:  [id number]   MEDICAL RECORD NO.:  [id number]          PATIENT TYPE:  OBV   LOCATION:  3705                         FACILITY:  [facility]   PHYSICIAN:  Levander Campion, M.D.  DATE OF BIRTH:  06/10/1960   DATE OF ADMISSION:  10/29/2006  DATE OF DISCHARGE:                              HISTORY & PHYSICAL   CHIEF COMPLAINT:  Shortness of breath and cough.   HISTORY OF PRESENT ILLNESS:  [name] is a 50 year old African  American female with history of CHF with an EF of 35% secondary to  Takotsubo syndrome who presents with a one month history of progressive  shortness of breath with exertion, cannot walk from her house to mailbox  without resting.  She has 3-pillow orthopnea and intermittent PND.  She  also complains of a severe cough that has also been progressing over the  past month to the point she has daily posttussive emesis.  She has a  cough at baseline, but this is usually productive of clear sputum and  currently is productive of brown sputum.  She complains of wheezing,  particularly at night with chest tightness.  Heat and strong odors  exacerbate her shortness of breath, cough, and wheezing.  She also  complains of substernal chest pain, sometimes associated with exertion  and relieved by rest, although most often it is associated with cough.  She denies fevers or chills.   REVIEW OF SYSTEMS:  Positive for headache after vomiting.  Her vomit is  usually clear liquids, but sometimes it is streaked with small drops of  blood.  She denies edema.  She complains of acid reflux symptoms.  She  stopped her PPI a few months ago.  She has a 15-pack year history of  smoking.  She reports full compliance with her meds.   PAST MEDICAL HISTORY:  1. CHF with EF of 35% secondary to Takotsubo syndrome, last echo May      2007.  2. Hypertension.  3. GERD.  4. Menorrhagia with history of fibroids and severe microcytic anemia.   MEDICATIONS:  1. Lasix 40 mg p.o. daily.  2. Avapro 150 mg p.o. daily.  3. Spirolactone 12.5 mg p.o. daily.  4. Digoxin 0.125 mg p.o. daily.  5. Kay Ciel 20 mEq p.o. daily.  6. Coreg 6.25 mg p.o. b.i.d.   ALLERGIES:  NO KNOWN DRUG ALLERGIES.   PAST SURGICAL HISTORY:  C-section.   SOCIAL HISTORY:  The patient lives with her 72 year old daughter who has  multiple developmental problems and her 42 year old son.  She currently  is not working outside of the home.  She is a 15-year, one-pack per day  smoker and quit one year ago.  She denies alcohol or illicit drugs.   FAMILY HISTORY:  The patient has a sister who is 66 years old and has  had 2 MI and 3 strokes and has a pacemaker.  She has another sister with  hypertension.  Her mom passed away in her 43s of a stroke, she also had  retention and heart problems.  Her father passed away of alcoholism.  REVIEW OF SYSTEMS:  As per HPI, except positive for dizziness upon  standing and positive for menorrhagia.  Otherwise, 10 systems reviewed  and all were negative.   PHYSICAL EXAMINATION:  VITAL SIGNS:  Temperature 98, pulse 77-80, blood  pressure 114-126/65-75, respiratory rate 14-20, sats 98% to 100% on room  air.  GENERAL:  Alert and oriented, in no acute distress, eating.  HEENT:  Head normocephalic, atraumatic.  Eyes, extraocular muscles are  intact.  No scleral icterus.  Pupils equal, round, reactive to light.  Conjunctivae pink.  Ears, fluid behind left TM, right TM clear.  Nose  and ears clear.  Oropharynx without erythema or exudate.  Pale mucous  membranes.  NECK:  Supple, nontender.  No lymphadenopathy.  CHEST:  Sternal wall, chest wall tenderness.  CARDIOVASCULAR:  Normal S1, S2 with repetitive cycles of 3 beats of  bigeminy intermixed with normal sinus rhythm with occasional PVCs.  PULMONARY:  Apical expiratory wheezes bilaterally.  Bases clear  bilaterally.  Normal respiratory effort.  ABDOMEN:  Soft, nontender,  nondistended.  Normoactive bowel sounds.  No  masses.  No hepatosplenomegaly.  EXTREMITIES:  Trace pitting edema to mention bilaterally, 2+ dorsalis  pedis pulses bilaterally.  SKIN:  No rashes or sores.  RECTAL:  Normal tone.  Minimal soft stool in vault.  NEURO:  Nonfocal.   LABORATORY DATA:  White count 9.6, hemoglobin 7.9, hematocrit 26.7,  platelets 388, ANC 6.1, dig level was less 0.2.  BNP 1,061, retic count  3.1%.  Cardiac enzymes set #1: CK 51, CK-MB 1.7, troponin 0.06.  Point  of care ISTAT:  Sodium 137, potassium 4.9, chloride 109, bicarb 20.6,  BUN 12, glucose 109, creatinine 1.1.   A chest x-ray showed cardiomegaly, no edema, and bronchitic changes.  EKG was no change from May 2007.   ASSESSMENT AND PLAN:  Ms. [name] was a 50 year old female with:  1. Chest pain: most likely her chest pain was secondary to chest wall      pain from her cough and a possible component of gastroesophageal      reflux disease, but given her cardiac history and description of      her chest pain with exertion relieved by rest, we will rule out      myocardial infarction by cycling cardiac enzymes and repeating      electrocardiogram in the a.m.  We will give a gastrointestinal cocktail and restart her proton pump  inhibitors.  1. Shortness of breath/cough:  The patient exact etiology is difficult      to pinpoint given her history of congestive heart failure and cough      sputum, and wheezes with smoking history suggestive of chronic      obstructive pulmonary disease and severe anemia, all of which can      contribute to shortness of breath.  She does not appear to be      volume overloaded despite her BNP of 1,061.  She was around this      value last year.  She has no edema on chest x-ray or exam.  We will      continue her home medications and repeat a 2D echo to evaluate her     congestive heart failure.  Her symptoms are most consistent with      chronic obstructive pulmonary  disease.  We will give q.4 hour      albuterol nebs and pre and post peak flows.  We are unsure of  patient's baseline and she is not requiring oxygen, she is not      febrile, so we will hold off on steroids and antibiotics at this      time for chronic obstructive pulmonary disease exacerbation.  2. Anemia:  The patient has a history of severe menorrhagia and      fibroids, likely she has severe iron deficiency anemia.  We will      obtain iron studies and begin FeS04 325 b.i.d.  We will heme check      her stools.  She has no family history of anemia, so thalassemia is      less likely, but consider hemoglobin and electrophoresis if iron      level is within normal limits.  3. GERD could also be contributing to her chest pain and cough.  Give      gastrointestinal cocktail and Protonix.  Disposition pending 2D      echo and rule out.           ______________________________  Levander Campion, M.D.     JH/MEDQ  D:  10/30/2006  T:  10/30/2006  Job:  [id number]

## 2010-10-05 NOTE — Assessment & Plan Note (Signed)
OFFICE VISIT   [name], [name]  DOB:  1961-02-04                                        August 03, 2009  CHART #:  [id number]   HISTORY:  The patient returns for followup related to continued sternal  pain status post mitral valve repair and aortic valve repair on December 23, 2008.  She was last seen here in the office on March 16, 2009.  Since then, she has continued to be followed by Dr. Sheliah Mends at the  [facility] and by Dr. Donia Guiles at the  [facility].  She has continued to take Ultram for prolonged  sternal pain.  She states that ever since her surgery last August, she  has still had some prolonged chest wall pain that seems to be constant  in nature.  She denies any sensation of clicking or motion of the  sternum.  She has not had any exertional chest pain.  She has not had  any fevers or chills.  She states that her surgical scar in the anterior  chest wall just seems to feel sensitive to the touch.  The pain seems to  bother her particularly at night when she is trying to sleep and at time  she will toss and turn and ultimately take Ultram for assistance.  The  pain does seem to be relieved by Ultram.  She states that she has not  had any problems with shortness of breath and she notes that her  exercise tolerance is better than it was last summer prior to her  surgery.  She otherwise has no complaints and has been getting along  fairly well.  She was seen in followup by Dr. Garen Lah last month and a  repeat echocardiogram was performed on June 30, 2009.  At that time,  her ejection fraction reportedly measured 35%.  There was moderate  mitral regurgitation with moderate aortic regurgitation and moderate  aortic stenosis.  Pulmonary artery pressures were decreased from prior  to surgery.   CURRENT MEDICATIONS:  Coreg, Lasix, potassium, aspirin, lisinopril, and  spironolactone.   PHYSICAL  EXAMINATION:  Notable for well-appearing obese female with  blood pressure 152/88, pulse 71, and oxygen saturation 99% on room air.  Examination of the chest reveals a well-healed median sternotomy scar.  The scar itself is somewhat hypertrophic and suggested that the patient  may have a tendency toward keloid formation.  On palpation, the sternum  is completely stable.  However, palpation does seem to elicit tenderness  and the skin and bone are clearly sensitive to the touch.  Auscultation  of the chest reveals clear breath sounds, which are symmetrical  bilaterally.  Cardiovascular exam demonstrates regular rate and rhythm.  There is a grade 2/6 systolic murmur along the sternal border.  No  diastolic murmurs are noted.  The abdomen is soft and nontender.  The  extremities are warm and well perfused.   DIAGNOSTIC TESTS:  Chest x-ray performed July 27, 2009 is reviewed.  This demonstrates stable cardiomegaly.  There are no pleural effusions.  All of the sternal wires appear intact.  There is no obvious evidence  for sternal fracture or nonunion.   IMPRESSION:  The patient continues to complain of anterior chest wall  pain related to her sternotomy performed last August.  She has not had  any trauma to the chest and she has no symptoms nor physical signs to  suggest the development of a sternal fracture or nonunion.  I suspect  that she simply has some increased sensitivity and pain that  occasionally can be seen this late after surgery, although this is  unquestionably unusual.   PLAN:  We will obtain a chest CT scan to rule out the possibility of the  sternal fracture or nonunion.  I doubt this is the case.  I have  reassured the patient that typically with time the severity of the pain  continues to slowly abate.  I would suggest using nonsteroidal  antiinflammatory medications for treatment of these symptoms as they  occur and trying to stay away from any narcotics or  narcotic-related  pain relievers.  Ultimately, the patient will continue to need to be  followed for her underlying aortic and mitral valve disease.  However,  the only alternative to valve repair is valve replacement, and I am  skeptical that she would do well on long-term Coumadin therapy.  We will  continue to follow along and plan to see her back in 6 months.   Salvatore Decent. Cornelius Moras, M.D.  Electronically Signed   CHO/MEDQ  D:  08/03/2009  T:  08/04/2009  Job:  [id number]   cc:   Sheliah Mends, MD  [name], M.D.

## 2010-10-05 NOTE — H&P (Signed)
NAMERAPHAELLA, LARKIN NO.:  [id number]   MEDICAL RECORD NO.:  [id number]          PATIENT TYPE:  INP   LOCATION:  4743                         FACILITY:  [facility]   PHYSICIAN:  Della Goo, M.D. DATE OF BIRTH:  1961-02-19   DATE OF ADMISSION:  02/03/2007  DATE OF DISCHARGE:                              HISTORY & PHYSICAL   This is an unassigned patient.   CHIEF COMPLAINT:  Chest pain and shortness of breath.   HISTORY OF PRESENT ILLNESS:  This is a 50 year old female presenting to  the emergency department with complaints of chest pain and increasing  shortness of breath.  She reports having shortness of breath for the  past 2-3 days and began to have chest pain today.  She describes the  chest pain as being in the middle of her chest and nonradiating.  She  rates the intensity of the pain as being 10/10 and describes it as being  sharp in quality.  She denies having any nausea, vomiting, or  diaphoresis associated with it.  The patient does report running out of  medications 1 week ago.  She does have a history of congestive heart  failure and hypertension.   PAST MEDICAL HISTORY:  History of Takotsubo syndrome, cardiomyopathy,  hypertension, uterine fibroids/menorrhagia, hypertension,  gastroesophageal reflux disease.  The patient has an ejection fraction  of 35% seen on echo in May 2007.  The patient also had a cardiac  catheterization May 2007 performed by Dr. Alanda Amass.   MEDICATIONS INCLUDE:  1. Coreg 6.25 mg one p.o. b.i.d.  2. Avapro 150 mg one p.o. daily.  3. Spironolactone 12.5 mg one p.o. daily.  4. Digoxin 0.125 mg one p.o. daily.  5. Lasix 40 mg one p.o. daily.  6. Potassium chloride 20 mEq one p.o. daily.  7. Iron sulfate 325 mg one p.o. b.i.d.  8. Omeprazole 40 mg one p.o. daily.   SOCIAL HISTORY:  Dictation ended at this point.      Della Goo, M.D.  Electronically Signed     HJ/MEDQ  D:  02/04/2007  T:  02/05/2007  Job:   [id number]

## 2010-10-05 NOTE — Consult Note (Signed)
[name], KERIN NO.:  [id number]   MEDICAL RECORD NO.:  [id number]          PATIENT TYPE:  INP   LOCATION:  3731                         FACILITY:  [facility]   PHYSICIAN:  Charlynne Pander, D.D.S.DATE OF BIRTH:  05/11/1961   DATE OF CONSULTATION:  11/28/2008  DATE OF DISCHARGE:                                 CONSULTATION   HISTORY OF PRESENT ILLNESS:  [name] is a 50 year old female  referred by Dr. Sheliah Plane for dental consultation.  The patient  with recent identification of severe mitral regurgitation.  The patient  with anticipated mitral valve replacement.  The patient is now seen as  part of pre-heart valve surgery dental protocol evaluation.   PAST MEDICAL HISTORY:  1. Cardiomyopathy initially diagnosed in 2007 with ejection fraction      of approximately 40% at this time.  2. Severe mitral regurgitation with anticipated mitral valve      repair/replacement in the future.  3. Severe pulmonary hypertension.  4. History of a fibroid uterus.  5. History of thyroid mass, unknown etiology.  6. History of goiter status post biopsy.   ALLERGIES:  VICODIN.   MEDICATIONS:  1. Coreg 3.125 mg twice daily.  2. Potassium chloride 20 mEq daily.  3. Lasix 20 mg daily.  4. Provera 10 mg twice daily.  5. Aspirin 325 mg daily.  6. Flovent 2 puffs twice daily.  7. Aldactone 25 mg daily.  8. Lisinopril 10 mg daily.   SOCIAL HISTORY:  The patient is a widow.  The patient with two children.  The patient is a nonsmoker and nondrinker.   FAMILY HISTORY:  Mother died of coronary disease and a stroke.  Father  died with a history of seizures and alcoholism.   FUNCTIONAL ASSESSMENT:  The patient was independent for ADLs prior to  this admission.   REVIEW OF SYSTEMS:  As reviewed from the chart for this admission.   DENTAL HISTORY:   CHIEF COMPLAINT:  The patient is a pre-heart valve surgery dental  protocol evaluation.   HISTORY OF PRESENT  ILLNESS:  The patient recently identified with severe  mitral regurgitation.  The patient with anticipated mitral valve  repair/replacement as indicated.  The patient now seen as part of pre-  heart valve surgery dental protocol evaluation to rule out dental  infection which may affect the patient's systemic health and anticipated  heart valve surgery.   The patient currently denies acute toothache, swellings, or abscesses.  The patient knows that her teeth are in bad condition.  The patient is  interested in having all remaining teeth extracted at this time.  The  patient has not seen a dentist over 10+ years.  The patient denies  having any partial dentures.   DENTAL EXAMINATION:  GENERAL:  The patient is a well-developed, well-  nourished female in no acute distress.  VITAL SIGNS:  Blood pressure is 131/82, pulse rate 84, temperature is  96.9, and respirations are 18.  HEAD AND NECK:  There is no significant lymphadenopathy noted at this  time.  The patient denies acute TMJ symptoms.  INTRAORAL:  The patient with normal saliva.  There is no evidence of  abscess formation within the mouth.  DENTITION:  The patient with multiple missing teeth and multiple  retained root segments.  PERIODONTAL:  The patient with chronic advanced periodontal disease with  plaque and calculus accumulations, generalized gingival recession and  generalized tooth mobility.  The patient with moderate-to-severe bone  loss.  DENTAL CARIES:  The patient has rampant dental caries affecting almost  all remaining teeth.  CROWN OR BRIDGE:  There are no crown or bridge restorations.  PROSTHODONTIC:  The patient denies presence of partial dentures.  ENDODONTIC:  The patient currently denies a history of acute pulpitis  symptoms, although it did have previous history of toothaches months  ago.  OCCLUSION:  The patient with a poor occlusal scheme secondary to  multiple missing teeth, multiple retained root  segments, and lack of  replacement of missing teeth with dental prostheses.   RADIOGRAPHIC INTERPRETATION:  The patient with multiple missing teeth.  The patient with multiple retained root segments.  The patient with  rampant dental caries.  The patient with multiple areas of periapical  pathology.  The patient with a combined radiolucent/radiopaque area of  previous #32.  We will need to biopsy this area to rule out differences  between condensing osteitis versus osteomyelitis versus other pathology.   ASSESSMENT:  1. Chronic periodontitis bone loss.  2. Generalized gingival recession.  3. Generalized tooth mobility.  4. Rampant dental caries.  5. Multiple missing teeth.  6. Multiple retained root segments.  7. Multiple areas of chronic apical periodontitis.  8. No history of partial dentures.  9. Poor occlusal scheme.  10.Combined radiolucent/radiopaque area #32 with need for evaluation      with a bone biopsy.  11.A significant cardiovascular compromise with invasive dental      procedures up to and including death.   PLAN/RECOMMENDATIONS:  1. I discussed risks, benefits, and complications of various treatment      options with the patient in relationship to her medical and dental      conditions and anticipated heart valve surgery.  I discussed      various treatment options to include no treatment, total and      subtotal extractions with alveoloplasty, periodontal therapy,      dental restorations, root canal therapy, bridge therapy, implant      therapy, and replacing of missing teeth as indicated after adequate      healing.  I also discussed pre-prosthetic surgery as indicated      along with bone biopsy in the area of previous tooth #32.  The      patient currently wished to proceed with extraction of all      remaining teeth with alveoloplasty and pre-prosthetic surgery as      indicated along with bone biopsy in area #32.  The patient agrees      to have this  procedure done in the operating room on Monday, December 01, 2008 at 7:30 in the morning with general anesthesia via      nasoendotracheal tube.  2. Discussed the findings with Cardiology and Cardiovascular Heart      Surgery Team as indicated.  3. Start chlorhexidine rinses twice daily to help disinfect the oral      cavity.      Charlynne Pander, D.D.S.  Electronically Signed     RFK/MEDQ  D:  11/28/2008  T:  11/29/2008  Job:  [id number]   cc:   Sheliah Plane, MD  [name], M.D.  Velora Heckler, MD

## 2010-10-05 NOTE — Assessment & Plan Note (Signed)
OFFICE VISIT   [name], [name]  DOB:  02/24/1961                                        February 01, 2010  CHART #:  [id number]   HISTORY OF PRESENT ILLNESS:  The patient returns for followup of sternal  pain status post mitral valve repair and aortic valve repair on December 23, 2008.  She was last seen here in the office on July 24, 2009.  At  that time, she underwent a chest CT scan because of continued sternal  pain.  CT scan demonstrated the fact that her sternal wires were  completely intact, there was no evidence to suggest sternal dehiscence  and there was no sign of any sternal fracture.  In fact, the bone  appeared to be healing nicely in the midline.  No other abnormalities  were noted.  Since then, the patient has remained stable from a clinical  standpoint.  She continues to use oral Ultram for pain relief as  prescribed by Dr. Reche Dixon at the [facility].  She has also  tried a variety of topical things to apply to her anterior chest wall.  She states that nothing has seemed to help.  She complains that she  still has some pain and she wonders if there is some other medication  that could be prescribed to help her.  She denies any sensation of  clicking or motion of the chest.  She complains that her disability  papers have not been processed and she is thinking about going back to  work.  She reports that her breathing is actually quite good.  She  states that she has been walking without difficulty and she denies any  shortness of breath either with activity or at rest.  She has not had  any exertional chest pain.  She recently underwent thyroid surgery and  has gotten over that fairly well.  The remainder of her review of  systems is unremarkable.  The remainder of her past medical history is  unchanged.   CURRENT MEDICATIONS:  Coreg, Lasix, aspirin, and spironolactone.   PHYSICAL EXAMINATION:  Notable for well-appearing  African American  female with blood pressure 141/90, pulse 65, and oxygen saturation 98%  on room air.  Examination of the chest reveals a well-healed median  sternotomy scar with some keloid formation.  The sternum is completely  stable on palpation.  The patient does complain of pain with manual  pressure on the sternum on either side.  Auscultation of the chest  reveals clear breath sounds that are symmetrical bilaterally.  Cardiovascular exam is notable for regular rate and rhythm.  There is a  grade 2/6 crescendo-decrescendo systolic murmur heard best along the  right sternal border.  No diastolic murmurs are noted.  The abdomen is  soft and nontender.  The extremities are warm and well perfused.  There  is no lower extremity edema.   IMPRESSION:  The patient still complains of some pain in her chest  related to her sternotomy.  The sternum appears to have healed  completely and previous chest CT scan confirmed the fact that the bone  appeared to be healing completely with no sign of sternal nonunion,  dehiscence, nor fracture.  She has no symptoms nor signs of congestive  heart failure and overall, she looks quite good.  PLAN:  We will send the patient for a followup chest x-ray.  However, I  am skeptical that it would show anything different, and at this point, I  think we have anything further to offer.  I have offered to refer [facility] to  [facility] for [facility] pain relief if she continues to  have trouble.  I have also suggested that she try using some over-the-  counter pain relievers to see if this will assist with her chronic pain  control.  All of her questions have been addressed.  In the future, she  will call or return to see [place] as needed.   Salvatore Decent. Cornelius Moras, M.D.  Electronically Signed   CHO/MEDQ  D:  02/01/2010  T:  02/02/2010  Job:  [id number]   cc:   Sheliah Mends, MD  [name], M.D.

## 2010-10-05 NOTE — H&P (Signed)
HISTORY AND PHYSICAL EXAMINATION   December 16, 2008   Re:  [name], [name]            DOB:  1960-07-19   PRESENTING CHIEF COMPLAINT:  Exertional shortness of breath   HISTORY OF PRESENT ILLNESS:  The patient is a 50 year old obese African  American female from [place] with long-standing history of chronic  systolic and diastolic congestive heart failure that has been previously  attributed to takotsubo cardiomyopathy. This was first diagnosed in 2007  by Dr. Alanda Amass, at which time the patient was first diagnosed with  congestive heart failure.  Prior to that, she had been having problems  with shortness of breath for several months.  At that time, her ejection  fraction was reportedly 30-35%.  She was also noted to have severe  mitral regurgitation at that time.  Since then, she has been  intermittently lost to followup, and she has been evaluated in the  emergency department at [facility] on numerous occasions for shortness  of breath.  In January 2010, she was hospitalized with congestive heart  failure as well as acute blood loss anemia related to severe  menorrhagia.  Her uterine bleeding was attributed to uterine fibroids,  and the bleeding was controlled with medical therapy.  Symptoms of  congestive heart failure improved temporarily.  More recently, she was  being evaluated by Dr. Gerrit Friends and Dr. Reche Dixon for possible thyroid  surgery.  She underwent a nondiagnostic needle aspiration biopsy of her  thyroid, but at that time she was noted to be in class IV congestive  heart failure.  A followup echocardiogram was performed demonstrating  severe mitral regurgitation, and the patient was then referred back to  the [facility] where she was seen by Dr.  Sheliah Mends.  Dr. Garen Lah promptly admitted the patient to the  hospital for further evaluation.  She underwent transesophageal  echocardiogram on November 26, 2008.  This confirmed  the presence of severe  mitral regurgitation.  There was also moderate aortic regurgitation as  well as mild-to-moderate tricuspid regurgitation.  Left and right heart  catheterization was performed on November 27, 2008.  This revealed normal  coronary artery anatomy with no significant coronary artery disease.  There was moderate-to-severe pulmonary hypertension with PA pressures  measured at 62/26 with a pulmonary capillary wedge pressure of 45 and  large V-waves.  Central venous pressure was 7.  Left ventricular  ejection fraction was estimated 45%.  Cardiothoracic surgery  consultation was requested and the patient was initially evaluated by  Dr. Tyrone Sage.  She was noted to have very poor dentition and referred  for dental consultation.  She underwent a full dental extraction with  four-quadrant alveoplasty on December 01, 2008.  She tolerated this well and  was ultimately discharged home and now returns to the office today for  possible elective surgical intervention.   REVIEW OF SYSTEMS:  GENERAL:  The patient reports normal appetite.  She  has not been gaining nor losing weight recently.  She is 5 feet tall and  weighs approximately 145 pounds.  CARDIAC:  The patient has long-standing history of symptoms of chronic  congestive heart failure.  At the time of her recent hospitalization,  she had three-pillow orthopnea as well as severe exertional shortness of  breath and intermittent chest pain that was sporadic and not necessarily  related to physical activity.  The patient does report that her symptoms  are somewhat improved since hospital  discharge on medical therapy.  She  still has significant exertional shortness of breath, but it is better  than it was.  She now only has one-pillow orthopnea.  She denies lower  extremity edema.  She reports occasional tachy palpitations, but denies  any syncopal episodes.  She has a chronic dry cough.  RESPIRATORY:  Notable for the absence of  productive cough, hemoptysis,  wheezing.  GASTROINTESTINAL:  Notable for the absence of any difficulty swallowing.  The patient reports some burning epigastric pain intermittently.  This  does not seem to be related to meals.  She has problems with chronic  constipation.  She denies hematochezia, hematemesis, melena.  MUSCULOSKELETAL:  Negative.  The patient denies significant arthritis or  arthralgias.  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  Etiology unclear.  The  patient otherwise denies any transient monocular blindness or transient  numbness or weakness involving either upper or lower extremity.  INFECTIOUS:  Negative.  PSYCHIATRIC:  Negative.  GENITOURINARY:  Negative.  The patient reports no recent history of  vaginal bleeding since her episode of severe menorrhagia in February  2010.   PAST MEDICAL HISTORY:  1. Congestive heart failure, chronic systolic and diastolic.  2. Mitral regurgitation.  3. Aortic regurgitation.  4. Takotsubo cardiomyopathy.  5. Hypertension.  6. Obesity.  7. Fibroid uterus with menorrhagia.  8. Thyroid mass.   PAST SURGICAL HISTORY:  Cesarean section x2.   SOCIAL HISTORY:  The patient is single and lives with her son and  daughter locally here in [place].  She is currently unemployed and  has applied for disability.  She denies any alcohol use.  She has a  history of tobacco abuse but quit smoking 2 years ago.   CURRENT MEDICATIONS:  1. Coreg 3.125 mg twice daily.  2. Potassium chloride 20 mEq daily.  3. Lasix 20 mg daily.  4. Provera 10 mg twice daily  5. Aspirin 325 mg daily.  6. Flovent inhaler 2 puffs twice daily.  7. Aldactone 25 mg daily.  8. Lisinopril 10 mg daily.   DRUG ALLERGIES:  VICODIN causes itching.   PHYSICAL EXAMINATION:  The patient is a well-appearing, mildly obese  [place] female who appears his stated age in no acute  distress.  Blood pressure 146/80, pulse 64 and regular, oxygen saturation 100% on  room air.  HEENT exam is notable for enlarged thyroid gland which is  diffusely enlarged.  There is no palpable lymphadenopathy.  Auscultation  of the chest demonstrates clear breath sounds which are symmetrical  bilaterally.  No wheezes or rhonchi are demonstrated.  Cardiovascular  exam includes regular rate and rhythm.  There is a grade 2-3/6 systolic  murmur heard best at the apex.  I do not appreciate a diastolic murmur,  although heart sounds distant.  The abdomen is soft, nondistended, and  nontender.  Bowel sounds are present.  The extremities are warm and well  perfused.  Femoral pulses are palpable.  Distal pulses are palpable.  There is no lower extremity edema.  There is no sign of significant  venous insufficiency.  Rectal and GU exams are both deferred.  Neurologic examination is grossly nonfocal.   DIAGNOSTIC TESTS:  Transesophageal echocardiogram performed by Dr.  Garen Lah on November 26, 2008, is reviewed.  This demonstrates moderate left  ventricular dysfunction.  There is severe mitral regurgitation.  The  mitral valve annulus is dilated.  The anterior  and posterior leaflets of  the mitral valve are severely restricted, particularly in the posterior  leaflet.  There may be some thickening of the subvalvular apparatus and  it is possible this could represent a rheumatic valve.  However, overall  the leaflets appear fairly thin, and it is equally likely if not more so  that this is all simply related to severe left ventricular dysfunction  with annular dilatation and downward displacement of the subvalvular  apparatus.  There is a broad central jet of mitral regurgitation that  fills the left atrium.  There is left atrial argument.  The aortic valve  is tricuspid.  All three leaflets move well.  There is a central failure  of coaptation with an eccentric jet of regurgitation that courses along   the ventricular surface of the anterior leaflet of the mitral valve.  In  some views, the degree of regurgitation appears mild, in others it  appears moderate to severe.  There is mild-to-moderate tricuspid  regurgitation.  No other abnormalities are noted.   Cardiac catheterization performed by Dr. Garen Lah on November 26, 2008, is  reviewed.  Findings are as discussed previously notable for the presence  of normal coronary artery anatomy with no significant coronary artery  disease and moderate-to-severe pulmonary hypertension.   IMPRESSION:  Severe mitral regurgitation with underlying moderate-to-  severe left ventricular dysfunction, chronic systolic and diastolic  congestive heart failure.  The patient also has at least mild-to-  moderate aortic regurgitation.   PLAN:  I have discussed matters at length with the patient and her son  here in the office today.  They understand the indications and potential  benefits of surgical intervention.  They understand the somewhat  elevated risks associated with surgery given the nature of the procedure  required and the degree of underlying left ventricular dysfunction.  We  will plan mitral valve repair and mitral valve replacement if successful  repair is not feasible.  Because of her history of recent bleeding  problems and difficulty with long-term medical therapy, valve  replacement will need to be with use of bioprosthetic tissue valve.  She  understands that this will come with the potential for late structural  valve deterioration and failure.  However, she agrees that the  alternative of use of a mechanical prosthesis would probably not be in  her best interest.  At the time of surgery, we will reevaluate the  severity of aortic regurgitation.  If in fact aortic regurgitation is  moderate to severe, we would proceed with concomitant aortic valve  repair or replacement as well.  If not, we would plan to repair or  replace her mitral valve  using minimally invasive technique.  It the  aortic valve needs to be taken care of, we may elect to convert to a  full open sternotomy.  All of their questions have been addressed.  We  plan to proceed with surgery on Tuesday, December 23, 2008.  They  understand and accept all potential associated risks of surgery  including but not limited to risk of death, stroke, myocardial  infarction, congestive heart failure, respiratory failure, renal  failure, pneumonia, bleeding requiring blood transfusion, arrhythmia,  heart block with bradycardia requiring permanent pacemaker, late  complications related to valve repair or replacement, potentially  requiring additional surgery in the future.  All of their questions have  been addressed.   Salvatore Decent. Cornelius Moras, M.D.  Electronically Signed   CHO/MEDQ  D:  12/16/2008  T:  12/17/2008  Job:  [id number]   cc:   Sheliah Mends, MD  [name], M.D.

## 2010-10-05 NOTE — Discharge Summary (Signed)
NAMEEVELEIGH, CRUMPLER NO.:  [id number]   MEDICAL RECORD NO.:  [id number]          PATIENT TYPE:  INP   LOCATION:  4741                         FACILITY:  [facility]   PHYSICIAN:  Elliot Cousin, M.D.    DATE OF BIRTH:  [birth year]   DATE OF ADMISSION:  07/16/2007  DATE OF DISCHARGE:  07/22/2007                               DISCHARGE SUMMARY   DISCHARGE DIAGNOSES:  1. Acute systolic congestive heart failure exacerbation.  The      patient's ejection fraction is estimated to be 40-50% per 2-D      echocardiogram on July 17, 2007.  2. History of takotsubo cardiomyopathy.  3. Acute bronchitis.  4. Hypertension.  5. Escherichia coli urinary tract infection.  6. Microcytic anemia.   DISCHARGE MEDICATIONS:  1. Lasix 40 mg daily.  2. Avapro 150 mg daily.  3. Digoxin 0.125 mg daily.  4. Carvedilol 3.125 mg b.i.d.  5. Cipro 500 mg b.i.d. for 3 more days.  6. Albuterol inhaler two puffs t.i.d. p.r.n.  7. Prednisone taper over the next 5 days.  8. Stop potassium and spironolactone.   DISCHARGE DISPOSITION:  The patient is being discharged to home in  improved and stable condition on July 22, 2007.  The plan is to have the  patient follow up with Dr. Alanda Amass in his office.  The office staff  will call the patient to arrange an appointment.  The patient was also  given information on the [facility] and the internal medicine  outpatient clinic at [facility].  She was advised to call to  make a follow-up appointment.   CONSULTATIONS:  [facility] cardiologists Madaline Savage, M.D., and  Nicki Guadalajara, M.D.   PROCEDURES PERFORMED:  2-D echocardiogram on July 17, 2007.  The  results revealed overall left ventricular systolic function was mildly  decreased.  Ejection fraction in the range of 40-50%.  Hypokinesis of  the basal-mid posterior wall.  Left ventricular wall thickness was  mildly increased.  Mild to moderate thickening of the mitral  valve with  moderate mitral valvular regurgitation.  Estimated peak right  ventricular systolic pressure was mildly increased.  Mild to moderate  tricuspid valvular regurgitation.   HISTORY OF PRESENT ILLNESS:  The patient is a 50 year old woman with a  past medical history significant for takotsubo cardiomyopathy, who  presented to the emergency department on July 16, 2007, with a chief  complaint of shortness of breath.  When she was evaluated in the  emergency department, her initial chest x-ray revealed congestive heart  failure with a possibility of a left lower lobe infiltrate.  Her BNP was  elevated at 1129.  The patient was hemodynamically stable and afebrile.  The patient was subsequently admitted for further evaluation and  management.   For additional details, please see the dictated history and physical.   HOSPITAL COURSE:  1. Acute systolic congestive heart failure exacerbation.  The patient      was restarted on Lasix as she had been noncompliant secondary to      financial constraints prior to the hospital admission.  The Lasix  was started at 40 mg IV daily.  In addition, she was maintained on      her chronic cardiac medications including Coreg, spironolactone,      Avapro and digoxin.  For further evaluation cardiac enzymes, 2-D      echocardiogram and a TSH were ordered.  The patient's cardiac      enzymes were essentially negative during the hospital course.  Her      TSH was within normal limits at 2.39.  her 2-D echocardiogram      revealed an ejection fraction of 40-50%.  Subsequently, cardiology      was consulted.  Dr. Elsie Lincoln provided the initial cardiology      evaluation.  In essence, he agreed with medical management.  Over      the course of the hospitalization the patient's shortness of breath      subsided.  The dose of Coreg was decreased to 3.125 mg b.i.d.      because of relative hypotension.  The patient's fasting lipid panel      revealed a  total cholesterol of 112, triglycerides of 60, HDL of      24, and LDL of 76.  As the patient diuresed and improved      clinically, her BNP improved as well.  It decreased to 282;      however, prior to hospital discharge it did increase again to 429;      it was still significantly lower than 1129 at the time of the      hospital admission.  A follow-up chest x-ray revealed cardiac      enlargement without any evidence of pulmonary edema or heart      failure.  The Lasix was changed to p.o. and the potassium and      spironolactone were discontinued by the cardiologist as the      patient's serum potassium reached 5.1.  Prior to hospital      discharge, the patient had no complaints of shortness of breath or      chest pain.  She was advised to follow up with cardiologist Dr.      Alanda Amass per his schedule.  The appointment will be arranged by      [facility].  2. Superimposed acute bronchitis.  The patient developed bronchospasms      during the hospital course.  The bronchospasms were felt to be a      consequence of acute bronchitis more than congestive heart failure.      Therefore, the patient was started on Xopenex nebulizers and a      short course of intravenous steroids.  As of today, the extent of      bronchospasms has subsided substantially.  The patient will      therefore be discharged to home on albuterol MDI and a short      prednisone taper.  Of note, the patient was started on Rocephin and      azithromycin empirically for the first two days of hospitalization.      However, when she developed an urinary tract infection secondary to      E. coli, these medications were discontinued and the patient was      started on Cipro.  3. Escherichia coli urinary tract infection.  The patient's urinalysis      was positive for wbc's and bacteria at the time of the initial      hospital assessment.  Eventually the  urine culture grew out greater      than  100,000 colonies of E. coli.  The patient was therefore      started on ciprofloxacin.  She completed a total of four days of      antibiotic treatment during the hospital course.  She will be      discharged to home on three more days of Cipro therapy.      Elliot Cousin, M.D.  Electronically Signed     DF/MEDQ  D:  07/22/2007  T:  07/23/2007  Job:  [id number]   cc:   Gerlene Burdock A. Alanda Amass, M.D.

## 2010-10-05 NOTE — Group Therapy Note (Signed)
NAME:  [name], [name] NO.:  [id number]   MEDICAL RECORD NO.:  [id number]          PATIENT TYPE:  WOC   LOCATION:  [facility]                   FACILITY:  [facility]   PHYSICIAN:  Argentina Donovan, MD        DATE OF BIRTH:  [birth year]   DATE OF SERVICE:                                  CLINIC NOTE   The patient is a 50 year old African American female who has come in  because of dysfunctional bleeding and was going to soon have thyroid  surgery.  This young lady was last seen here in May 2009 where at which  time she had dysfunctional bleeding and endometrial biopsy at that time,  which was benign and a normal Pap smear.  She never did return and  apparently stopped bleeding for some time.  She was seen in the  maternity admissions office in mid January of this year where she came  in with a complaint of chest pain, shortness of breath, and dyspnea.  She was immediately sent over to [facility] where she was  admitted for 4 days.  At that time, she had a hemoglobin about around 7  and was treated for a cardiomyopathy and transfused 2 units of blood.  The bleeding had abated somewhat but has continued almost every day  where she at least has to change three pads since that time and interim  and subsequently they found a mass on her thyroid gland and are planning  in early April a thyroidectomy.  She was sent in by her general doctor  from [facility] to coordinate any treatment that might have to be done.  In a previous visit, this lady had about an 11-week size uterus with  some irregular fibroids, not much showed up as far as submucous fibroids  and on physical examination, the uterus does not seem to have enlarged.  There is very little bleeding today and the plan is the patient has been  on Megace but that has not completely stopped the bleeding.  My plan  today is to get a CBC to temporarily put the patient on Depo-Lupron  11.25 to put her in temporary menopause  until her thyroid surgery can be  completed and then have her come back here to [facility] to see [place]  after that surgery where at that time perhaps an endometrial ablation  may be undertaken to control this lady's bleeding and she has other  medical problems, such as hypertension and cardiomyopathy that are more  serious at this time.   IMPRESSION:  Continual dysfunctional bleeding and normal endometrial  biopsy 8 months ago and secondary anemia who needs a thyroid surgery for  a thyroid nodule.  I am finally attempting to contact her doctor at  [facility] who was coordinating her care.           ______________________________  Argentina Donovan, MD     PR/MEDQ  D:  07/31/2008  T:  08/01/2008  Job:  [id number]

## 2010-10-05 NOTE — Op Note (Signed)
[name], GARVEY NO.:  [id number]   MEDICAL RECORD NO.:  [id number]          PATIENT TYPE:  INP   LOCATION:  3737                         FACILITY:  [facility]   PHYSICIAN:  Charlynne Pander, D.D.S.DATE OF BIRTH:  Aug 07, 1960   DATE OF PROCEDURE:  12/01/2008  DATE OF DISCHARGE:                               OPERATIVE REPORT   PREOPERATIVE DIAGNOSES:  1. Cardiomyopathy.  2. Severe mitral regurgitation.  3. Pre-mitral valve surgery dental protocol.  4. Apical periodontitis.  5. Chronic periodontitis.  6. Multiple retained root segments.  7. Rampant dental caries.  8. Combined radiopaque/radiolucent lesion in the area previously      occupied by tooth #32.   POSTOPERATIVE DIAGNOSES:  1. Cardiomyopathy.  2. Severe mitral regurgitation.  3. Pre-mitral valve surgery dental protocol.  4. Apical periodontitis.  5. Chronic periodontitis.  6. Multiple retained root segments.  7. Rampant dental caries.  8. Combined radiopaque/radiolucent lesion in the area previously      occupied by tooth #32.   OPERATIONS:  1. Extraction of remaining teeth (tooth numbers 2, 3, 4, 5, 6, 7, 8,      9, 10, 11, 12, 13, 14, 15, 16, 19, 20, 21, 22, 23, 24, 25, 26, 27,      28, 29, and 31).  2. Four quadrants of alveoloplasty.  3. Bone and soft tissue biopsies in the area of previous tooth #32.   SURGEON:  Charlynne Pander, DDS   ASSISTANT:  Cindy Hazy (dental assistant)   ANESTHESIA:  General anesthesia via oral endotracheal tube.   MEDICATIONS:  1. Ancef 1 gram IV prior to invasive dental procedures.  2. Local anesthesia with a total utilization of 5 carpules each      containing 9 mg of bupivacaine with 0.009 mg of epinephrine.   SPECIMENS:  1. There were 27 teeth that were discarded.  2. A 4 x 4 mm bone biopsy as well as a 4 x 4 mm soft tissue biopsy in      the area of previous tooth #32.  Please see orthopantogram for      radiographic appearance.   DRAINS:  None.   CULTURES:  None.   ESTIMATED BONE LOSS:  100 mL.   FLUIDS:  700 mL of lactated Ringer solution.   COMPLICATIONS:  None.   INDICATIONS:  The patient with known cardiomyopathy and now worsening  mitral regurgitation.  The patient with anticipated mitral valve  replacement heart surgery.  The patient was examined as part of a pre-  heart valve surgery dental protocol evaluation.  Treatment plan was  formulated for extraction of remaining teeth with alveoloplasty and pre-  prosthetic surgery as indicated along with biopsies in the area of  previous tooth #32.  This treatment plan was formulated to decrease the  risks and complications associated with dental infection from further  affecting the patient's systemic health as well as to prevent infection  in the upcoming mitral valve replacement surgery.   OPERATIVE FINDINGS:  The patient was examined in the operating room #10.  The teeth were identified for extraction.  Tooth numbers  2, 3, 4, 5, 6,  7, 8, 9, 10, 11, 12, 13, 14, 15, 16, 19, 20, 21, 22, 23, 24, 25, 26, 27,  28, 29, and 31 were present and identified for extraction.  The patient  was noted be affected by apical periodontitis, multiple retained root  segments, rampant dental caries, chronic periodontitis, and the presence  of a compliant radiopaque radiolucent lesion in the area of previous  tooth #32.  The aforementioned necessitated removal of all remaining  teeth with alveoloplasty and pre-prosthetic surgery as indicated along  with biopsies as indicated in the area of tooth #32.   DESCRIPTION OF PROCEDURE:  The patient was brought to the main operating  room #10.  The patient was then placed in the supine position on the  operating room table.  General anesthesia was then induced via an oral  endotracheal tube.  The patient was then prepped and draped in usual  manner for dental medicine procedure.  A time-out was performed and the  patient was  identified and procedures were verified.  A throat pack was  placed at this time.  The oral cavity was then thoroughly examined with  the findings as noted above.  The patient was then ready for the dental  medicine procedure as follows:   Local anesthesia was administered sequentially with a total utilization  of 5 carpules each containing 9 mg of bupivacaine with 0.009 mg of  epinephrine.   The maxillary left and right quadrants were first approached.  Maxillary  areas were anesthetized utilizing appropriate infiltration methods.  The  maxillary right quadrant was then approached at this time.  A #15 blade  incision was then made from the maxillary right tuberosity and extended  to the mesial #11.  Surgical flap was then carefully reflected.  The  teeth were then subluxated with a series of straight elevators.  Tooth  numbers 2, 3, 4, 5, 6, 7, 8, 9, 10, and 11 were then removed with a 150  forceps without complications.  Alveoloplasty was then performed  utilizing rongeurs and bone file.  The soft tissues were approximated  and trimmed appropriately.  The surgical site was then irrigated with  copious amounts of sterile saline.  The surgical site was then closed  from the maxillary right tuberosity and extended to the mesial #8  utilizing 3-0 chromic gut suture in a continuous interrupted suture  technique x1.  The maxillary left quadrant was then approached.  A #15  blade incision was then made from the maxillary left tuberosity and  extended to the mesial #11.  A surgical flap was then carefully  reflected.  Roots in the area of tooth numbers 16, 15, 14, and 13 were  then removed with a 150 forceps without complication.  Tooth #12 was  then removed with a 150 forceps without complications.  Alveoloplasty  was then performed utilizing rongeurs and bone file.  The tissues were  then approximated and trimmed appropriately.  The surgical site was then  irrigated with copious amounts of  sterile saline.  The surgical site was  then closed from the maxillary left tuberosity and extended to the  mesial #9 utilizing 3-0 chromic gut suture in a continuous interrupted  suture technique x1.  Two individual interrupted sutures were then  placed to further close the surgical site as indicated.   At this point in time, the mandibular quadrants were approached.  The  patient was given bilateral inferior alveolar nerve blocks utilizing the  bupivacaine with epinephrine.  A #15 blade incision was then made from  the distal of #19 and extended to the distal of #32.  The surgical flap  was then carefully reflected.  Appropriate amounts of buccal and  interseptal bone were removed around tooth numbers 20, 21, 22, 27, 28,  29, and 31 with a surgical handpiece and bur and copious amounts of  sterile saline.  The lower teeth were then subluxated with a series of  straight elevators.  Tooth numbers 19, 20, 21, 22, 23, 24, 25, 26, 27,  28, and 29 were then removed with a 151 forceps without complications.  The coronal aspect of tooth #31 was then removed with a 17 forceps and  left the roots remaining.  A surgical handpiece and bur and copious  amounts of sterile saline was then used to remove further buccal and  interseptal bone as well as to section the individual roots from the  buccal to the lingual.  The roots were then elevated out with a series  of Cryers elevators without complications.  Further alveoloplasty was  then performed in the mandibular left and mandibular right quadrants  appropriately.  At this point in time, a 4 x 4 mm piece of bone was  removed over the area of previous tooth #32after bone removal with  surgical handpeice.  This was submitted to pathology for evaluation.  At  this point in time, the soft tissue beneath that bone was curetted out  and submitted to pathology for further evaluation as well to rule out  possible condensing osteitis, osteomyelitis,  granulomatous disease or  other pathology.  At this point in time, the mandibular left and  mandibular right quadrants were irrigated with copious amounts of  sterile saline x4.  The mandibular left surgical site was then closed  from the distal of #19 and extended to the mesial #24 utilizing 3-0  chromic gut suture in a continuous interrupted suture technique x1.  This was after all soft tissues were approximated and trimmed  appropriately.  The mandibular right quadrant was then closed from the  distal #32 and extended to the mesial #25 utilizing 3-0 chromic gut  suture in continuous interrupted suture technique x1.   At this point in time, the entire mouth was irrigated with copious  amounts of sterile saline.  The patient was examined for complications,  seeing none, the dental medicine procedure was deemed to be complete.  The throat pack was removed at this time.  Series of 4 x 4 gauze were  placed in the mouth to aid hemostasis.  After appropriate amount of  time, the patient was handed over to the Anesthesia Team for final  disposition.  The patient was then extubated and taken to the  Postanesthesia Care Unit with stable vital signs and good oxygenation  level.  All counts were correct for the dental medicine procedure.      Charlynne Pander, D.D.S.  Electronically Signed     RFK/MEDQ  D:  12/01/2008  T:  12/01/2008  Job:  [id number]   cc:   Sheliah Plane, MD  Salvatore Decent. Cornelius Moras, M.D.

## 2010-10-05 NOTE — Discharge Summary (Signed)
[name], [name] NO.:  [id number]   MEDICAL RECORD NO.:  [id number]          PATIENT TYPE:  INP   LOCATION:  2028                         FACILITY:  [facility]   PHYSICIAN:  Salvatore Decent. Cornelius Moras, M.D. DATE OF BIRTH:  [birth year]   DATE OF ADMISSION:  12/23/2008  DATE OF DISCHARGE:  01/01/2009                               DISCHARGE SUMMARY   ADDENDUM:  The patient was tentatively ready for discharge to home  possibly that Sunday, pending she remained stable.  Unfortunately, the  patient's discharge was held secondary to increasing BNP and volume  overload.  She was already on Lasix and this was changed to IV at that  time due to elevated BNP.  This was followed closely.  BNP increased to  2375 by postop day #6.  The patient was continued on IV Lasix.  BNP was  followed and did start to trend down.  By postop day #9, it was 1452.  During this time, a chest x-ray was obtained which showed small  bilateral right effusion.  It was felt that diuretics would benefit this  as well.  Vital signs were followed closely.  The patient remained in  normal sinus rhythm.  The patient's blood pressure noted to be elevated  and she was continued on Coreg and lisinopril.  These medicines were  adjusted as needed.  Over the next several days, the patient's blood  pressure did drop in the 80s systolic.  Her medication was held and  reevaluated by Cardiology.  Eventually, the patient was switched to p.o.  Lasix.  They continued her on lisinopril 2.5 mg at night and Coreg 6.25  mg in the a.m. and 3.125 mg at bedtime.  Currently, the patient's blood  pressure is tolerating well.  During this time, the patient continued to  ambulate 3-4 times per day.  She continued to progress well.  All  incisions were noted to be clean, dry, and intact and healing well.  She  was tolerating diet well.  No nausea or vomiting noted.  Ciprofloxacin  was discontinued for a complete treatment for urinary tract  infection.   It is felt that the patient is progressing well and ready for discharge  to home at this time.  Her most recent lab work shows BNP of 1452.  Sodium of 137, potassium 3.9, chloride 104, bicarbonate 25, BUN 12,  creatinine 0.84, and glucose 111.  White blood cell count 8.0,  hemoglobin 9.4, hematocrit 28.2, and platelet count 209.  The patient's  vital signs were noted to be stable.  She is afebrile in normal sinus  rhythm.   Please see dictated discharge summary for followup appointments and  discharge instructions.  The only followup appointment that has changed  is for the patient to follow up with Dr. Garen Lah on January 09, 2009 at  2:30 p.m.   DISCHARGE MEDICATIONS:  1. Aspirin 325 mg daily.  2. Coreg 6.25 mg in the a.m. and 3.125 mg at bedtime.  3. Lasix 40 mg daily.  4. Guaifenesin 600 mg b.i.d.  5. Lisinopril 2.5 mg at 6:00 p.m.  6. Potassium  chloride 20 mEq daily.  7. Spironolactone 12.5 mg daily.  8. Ultram 50 mg 1-2 tablets q.4-6 hours p.r.n. pain.  9. Flovent 1 puff b.i.d.      Stephanie Acre Dasovich, PA      Salvatore Decent. Cornelius Moras, M.D.  Electronically Signed    KMD/MEDQ  D:  01/01/2009  T:  01/02/2009  Job:  [id number]   cc:   Sheliah Mends, MD

## 2010-10-05 NOTE — Discharge Summary (Signed)
[name], BERGQUIST NO.:  [id number]   MEDICAL RECORD NO.:  [id number]          PATIENT TYPE:  INP   LOCATION:  5120                         FACILITY:  [facility]   PHYSICIAN:  Eduard Clos, MDDATE OF BIRTH:  1960/08/03   DATE OF ADMISSION:  06/13/2008  DATE OF DISCHARGE:  06/16/2008                               DISCHARGE SUMMARY   COURSE IN THE HOSPITAL:  A 50 year old female with known history of  Takotsubo cardiomyopathy and a history of fibroid uterus and  menorrhagia, hypertension, and asthma, presented with complaints of  shortness of breath and increasing menorrhagia.  The patient on  admission was found to have hemoglobin of 7.  The patient was transfused  3 units of packed red blood cells and I did discuss with the patient's  Gynecologist, Dr. Silas Flood, who advised the patient to be on Megace 40 mg  p.o. daily and to follow up with her as an outpatient at [facility] for which I did make an appointment on July 17, 2008, at 1 p.m.  The patient also in addition did not have primary care  physician, and I did discuss with the patient about Dr. Lovell Sheehan, whom  she needs to follow up or to make an appointment with the number  provided.  The patient has been having this longstanding left chest  mole, which has been growing in size.  I did call Surgery, who if  possible will be able to remove the mole and if done, the patient has to  have her biopsy followed through her PCP, which the patient has agreed.  At this time as the patient is ambulating well, asymptomatic, and  symptoms have improved, we will discharge home once the Surgery has seen  the patient.  At the time of dictation, the patient was hemodynamically  stable and her repeat CBCs were stable with hemoglobin around 10.  The  patient also had a sonogram of the head and neck, which showed dominant  3.2-cm left thyroid mass for which biopsy was recommended.  The patient  was  again advised that she will need it through her primary care  physician, which the patient has agreed.   PROCEDURES DONE DURING HER STAY:  1. Chest x-ray, June 13, 2008.  Cardiomegaly without CHF or      pneumonia.  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  2. CT angio chest, June 13, 2008, shows no evidence of pulmonary      emboli or thoracic aortic aneurysm or dissection.  Stable mild      peribronchial thickening with mild bibasilar atelectasis with      scarring.  3. Thyroid ultrasound on June 15, 2008, shows dominant 3.2-cm left      thyroid mass, considered FNA biopsy to exclude neoplasm.  Thus      recommendations follows the consents and statements, management of      thyroid nodule __________ has ultrasound.   FINAL DIAGNOSES:  1. Symptomatic anemia, secondary to acute blood loss, secondary to  menorrhagia.  2. Fibroid uterus.  3. History of Takotsubo cardiomyopathy, presently compensated.  4. Left thyroid mass.  5. Left chest mole.   MEDICATIONS AT DISCHARGE:  1. Lasix 40 mg p.o. daily.  2. Avapro 150 mg p.o. daily.  3. Digoxin 0.125 mg p.o. daily.  4. KCl 20 mEq p.o. daily.  5. Carvedilol 6.25 mg p.o. b.i.d.  6. Ferrous sulfate 325 mg p.o. daily.  7. Albuterol MDI 2 puffs q.6 p.r.n.  8. Fluticasone 200 mcg 2 puffs b.i.d.  9. Aspirin 325 mg p.o. daily.  10.Megace 40 mg p.o. daily x2 weeks.   PLAN:  The patient advised to keep appointment with Dr. Silas Flood at [facility] on July 17, 2008, at 1 p.m.  To follow Dr.  Ernie Avena office at [phone], to follow up with her within a week's  time and recheck a BMET, CBC and further followup on a thyroid mass  through primary care physician.  If the patient did have a left chest  mole removal through Surgery, then the biopsy has to be followed through  her primary care physician; if not, this mole has to be removed through  her primary  care physician.  The patient was educated extensively.      Eduard Clos, MD  Electronically Signed     ANK/MEDQ  D:  06/16/2008  T:  06/17/2008  Job:  [id number]

## 2010-10-05 NOTE — Assessment & Plan Note (Signed)
OFFICE VISIT   [name], [name]  DOB:  1960/09/07                                        March 16, 2009  CHART #:  [id number]   The patient returns to the office today for further followup status post  mitral valve repair and aortic valve repair on December 23, 2008.  She was  last seen here in the office on January 19, 2009.  Since then, she has  continued to do well clinically.  Her only complaint is that she still  has some soreness in her chest that is exacerbated with movement or  coughing.  This has continued to gradually subside overtime, but  nonetheless, it still bothers her some.  She still takes oral pain  relievers occasionally as needed.  She denies any sensation of motion or  clicking of the sternum.  She has not had fevers or chills.  She has not  had any shortness of breath and otherwise, she is doing quite well.  The  remainder of her review of systems is unremarkable.  She does have some  anxiety regarding her home situation.   Physical exam is notable for well-appearing female with blood pressure  139/83, pulse 76, oxygen saturation 100% on room air.  Examination of  the chest demonstrates a sternotomy scar that is essentially healed  completely.  The sternum is stable on palpation.  Breath sounds are  clear to auscultation.  No wheezes or rhonchi are noted.  Cardiovascular  exam is notable for regular rate and rhythm.  There is a grade 2/6  systolic murmur heard best along the right sternal border.  No diastolic  murmurs are noted.  The abdomen is soft, nontender.  The extremities are  warm and well perfused.  There is no lower extremity edema.   IMPRESSION:  Excellent progress following aortic valve repair and mitral  valve repair.  The patient is doing quite well.  The mild residual  soreness in her chest is not unexpected at this point in time, and I  have reassured her that this will continue to gradually resolve over  time.  Until  the soreness goes away, she continues to be advised to  avoid any heavy lifting or strenuous use of her arms or shoulders.  At  some point, a followup echocardiogram would be prudent.  We will leave  this to the discretion of Dr. Garen Lah and colleagues at [facility].  In the future, she will call and return to see [place] here as  needed.  All of her questions have been addressed.   Salvatore Decent. Cornelius Moras, M.D.  Electronically Signed   CHO/MEDQ  D:  03/16/2009  T:  03/16/2009  Job:  [id number]   cc:   Sheliah Mends, MD

## 2010-10-05 NOTE — H&P (Signed)
[name], MARASCO NO.:  [id number]   MEDICAL RECORD NO.:  [id number]          PATIENT TYPE:  INP   LOCATION:  6526                         FACILITY:  [facility]   PHYSICIAN:  Eduard Clos, MDDATE OF BIRTH:  [birth year]   DATE OF ADMISSION:  07/16/2007  DATE OF DISCHARGE:                              HISTORY & PHYSICAL   CHIEF COMPLAINT:  Shortness of breath.   HISTORY OF PRESENT ILLNESS:  A 50 year old female with known history of  cardiomyopathy presented to the ER complaining of increasing shortness  of breath.  The patient states that over the last two to three days she  has been experiencing shortness of breath with increasing intensity,  when she decided to come to the ER.  She was initially found to have  cough with expectoration.  She denies any fever or chills.  Denies any  palpitation, dizziness, loss of consciousness, or diaphoresis.  The  patient also denies any abdominal pain, nausea, vomiting, diarrhea,  dysuria, or any discharges.  The patient has been having the shortness  of breath with increase on exertion.  Denies any chest pain.   PAST MEDICAL HISTORY:  History of Takotsubo cardiomyopathy with a recent  ejection fraction of 55% in June of 2008, history of hypertension and  cigarette smoking.   PAST SURGICAL HISTORY:  Tubal ligation.   MEDICATIONS:  The patient states that she was taking all of her  medication as advised, but the patient has doubtful compliance  previously.  The patient is supposed to be on:  1. Lasix 40 mg p.o. daily.  2. Spironolactone 12.5 mg p.o. daily.  3. Avapro 150 mg p.o. daily.  4. Digoxin 0.125 mg p.o. daily.  5. Potassium chloride 20 mEq p.o. daily.  6. Aspirin 325 mg p.o. daily.   ALLERGIES:  No known drug allergies.   FAMILY HISTORY:  Significant for the patient's sister having  cardiomyopathy.   SOCIAL HISTORY:  The patient states that she quit smoking cigarettes one  year ago.  Denies any alcohol  or drug abuse.   REVIEW OF SYSTEMS:  As per history of present illness.  Nothing else  significant.   PHYSICAL EXAMINATION:  GENERAL:  The patient is examined at bedside not  in acute distress.  VITAL SIGNS:  Blood pressure 144/88, pulse 98 per minute, temperature  99.2, respirations 18 per minute, O2 saturations 93%.  HEENT:  Anicteric, no pallor.  CHEST:  Bilateral air entry decreased.  No rhonchi.  HEART:  S1 and S2 heard.  ABDOMEN:  Soft and nontender with bowel sounds heard.  NEUROLOGY:  Alert and oriented to time, place, and person.  Moves upper  and lower extremities, power 5/5  .  EXTREMITIES:  Peripheral pulses felt, no edema.   LABORATORY DATA:  Chest x-ray; congestive failure with possibility of  left lower lobe infiltrate.  EKG; normal sinus rhythm, no ST-T wave  changes.   CBC; WBC 5.7, hemoglobin 10.5, hematocrit 33.9, platelets 234,  neutrophils 59%. Chemistry; sodium 137, potassium 3.4, chloride 106,  carbon dioxide 24, glucose 118, BUN 8, creatinine 1.01, calcium 8.3.  CK-  MB less than 1, troponin less than 0.05.  BNP 1129.  Digoxin level less  than 0.2.  UA shows moderate leukocytes, WBC 21-50, bacteria many.   ASSESSMENT:  1. Decompensated congestive heart failure.  2. Possible left lower lobe pneumonia.  3. Possible urinary tract infection.  4. History of cigarette smoking.   PLAN:  Admit the patient to [facility] Team E to telemetry.  Continue IV Lasix and spironolactone along with digoxin, carvedilol, and  Avapro.  Place the patient on empiric antibiotics.  Follow blood  cultures and urine cultures.  Further orders will be decided as the  patient's condition evolves.      Eduard Clos, MD  Electronically Signed     ANK/MEDQ  D:  07/17/2007  T:  07/17/2007  Job:  [id number]

## 2010-10-05 NOTE — H&P (Signed)
NAMELASHANTE, [name] NO.:  [id number]   MEDICAL RECORD NO.:  [id number]          PATIENT TYPE:  INP   LOCATION:  4743                         FACILITY:  [facility]   PHYSICIAN:  Della Goo, M.D. DATE OF BIRTH:  [birth year]   DATE OF ADMISSION:  02/03/2007  DATE OF DISCHARGE:                              HISTORY & PHYSICAL   This is a continuation of the previous dictation which stopped at the  social history.   SOCIAL HISTORY:  The patient is a nonsmoker, nondrinker.   FAMILY HISTORY:  Mother passed away of a stroke at age 76.  Sister with  coronary artery disease and strokes, and another sister with  hypertension.   REVIEW OF SYSTEMS:  Pertinents are mentioned above.  The patient also  has had edema of the lower extremities.   PHYSICAL EXAMINATION FINDINGS:  This is an obese 50 year old female  currently in no acute distress.  VITAL SIGNS:  Temperature 98.0, blood pressure 126/65, heart rate 80,  respirations 14, O2 saturation 100% on room air.  HEENT:  Normocephalic, atraumatic.  There is mild scleral erythema and  scant yellowish exudate in both eyes.  Pupils are equally round,  reactive to light.  Extraocular muscles are intact.  Funduscopic benign.  Oropharynx is clear.  NECK:  Supple, full range of motion.  No thyromegaly, adenopathy or  jugular venous distention.  CARDIOVASCULAR:  Regular rate and rhythm.  No murmurs, gallops or rubs.  LUNGS:  Clear to auscultation bilaterally.  No rales, rhonchi or  wheezes.  ABDOMEN:  Positive bowel sounds, soft, nontender, nondistended.  EXTREMITIES:  With trace bilateral lower extremity edema to the  pretibial areas.  NEUROLOGIC:  Examination nonfocal.   LABORATORY STUDIES:  White blood cell count 7.6, hemoglobin 9.5,  hematocrit 30.6, MCV 73.2 and platelets 158.  Neutrophils 64%  lymphocytes 30%.  Sodium 142, potassium 3.4, chloride 108, BUN 13,  creatinine 1.0, bicarb 22.8 and glucose of 116.  Beta  natriuretic  peptide 2003.  Cardiac enzymes:  Creatinine kinase 75, CK-MB 1.9 and  troponin 0.11.  Chest x-ray findings reveal positive cardiomegaly but no  evidence of pulmonary edema.   ASSESSMENT:  A 50 year old female being admitted with:   1. Chest pain.  2. Acute right-sided congestive heart failure syndrome.  3. Hypertension.  4. Shortness of breath.  5. Conjunctivitis, both eyes.   PLAN:  The patient will be admitted to a telemetry area for cardiac  monitoring.  IV Lasix has been ordered for diuresis along with potassium  supplementation.  Her electrolytes will be monitored and she will be  placed on nitrates, aspirin and oxygen therapy.  She will resume her  regular medications.  GI and DVT prophylaxis have been ordered, also  erythromycin ophthalmic ointment has been ordered b.i.d. to both eyes.      Della Goo, M.D.  Electronically Signed     HJ/MEDQ  D:  02/04/2007  T:  02/05/2007  Job:  [id number]

## 2010-10-05 NOTE — Discharge Summary (Signed)
[name], [name]            ACCOUNT NO.:  [id number]   MEDICAL RECORD NO.:  [id number]          PATIENT TYPE:  INP   LOCATION:  4743                         FACILITY:  [facility]   PHYSICIAN:  Mobolaji B. Bakare, M.D.DATE OF BIRTH:  Sep 21, 1960   DATE OF ADMISSION:  02/03/2007  DATE OF DISCHARGE:  02/07/2007                               DISCHARGE SUMMARY   PRIMARY CARE PHYSICIAN:  Unassigned.   CARDIOLOGIST:  [facility] (Richard A. Alanda Amass,  M.D.)   FINAL DIAGNOSES:  1. Congestive heart failure exacerbation.  2. Noncompliance.  3. History of takotsubo cardiomyopathy,  most recent ejection fraction      of 55% in June 2008.  4. Chronic anemia.  5. Hypertension.  6. Gastroesophageal reflux disorder.  7. Renal insufficiency.  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  9. Toothache.  10.Conjunctivitis.   CONSULTANTS:  Cardiology consult provided by history, Nanetta Batty,  M.D.   PROCEDURES:  1. Chest x-ray showed marked cardiomegaly.  2. CT angiogram of the chest showed no evidence of acute pulmonary      embolism.  There was a large pulmonary opacity suggestive of mild      edema or acute alveolitis, moderate cardiomegaly, mild substernal      goiter.   BRIEF HISTORY:  [name] is a i59 year old African American female  who presented with sharp chest pain and shortness of breath.  She has  history of CHF and takotsubo cardiomyopathy.  The patient ran out of her  medications a week prior to her hospitalization because of financial  reasons, hence she was presented with CHF exacerbation as noted on  imaging study, and BNP was elevated at 2003 on admission.   HOSPITAL COURSE:  Problem 1.  CONGESTIVE HEART FAILURE EXACERBATION:  The patient was  started back on her home medications including spironolactone and Coreg.  She is also on Lasix.  She diuresed progressively.  Her weight on  admission was 68.6.  weight on  discharge was 64.6.  The patient has  symptomatically improved, lung sounds clinically clear, her BNP at the  time of discharge was 833.  She was seen in consultation by cardiology.  It appears that her ejection fraction has improved from 35% in May 2007  to 55% in June 2008.  She will follow up with her cardiologist in 1  week.   She had episode of nonsustained ventricular tachycardia (8 beats).  She  was asymptomatic.  Electrolytes were normal.   Problem 2.  The patient has history of chronic anemia.  Iron sulfate was  continued.  She has no history of dark or melenic stools.   Problem 3.  RENAL INSUFFICIENCY:  This is felt to be secondary to  diuretics.  On admission BUN and creatinine were 13/1.0.  at the time of  discharge BUN and creatinine were 20/1.26 and this has been stable  during the course of hospitalization with diuretic treatment.  The  patient to have follow-up BMET in 1-2 weeks by her primary care  physician.   Problem 4.  TOOTHACHE:  On the day  of admission the patient complained  of right upper molar toothache.  She definitely has a dental caries in  her right upper third molar.  She was started on Augmentin and was  instructed to follow up with her dentist in 1-2 days.  She has been  given telephone numbers to call for appointment.   Problem 5.  CONJUNCTIVITIS:  The patient was noted on admission to have  bilateral red eyes.  There was no discharge.  She was empirically  started on treatment with erythromycin eye drops, which she will  complete for a total of 1 week.  At the time of discharge There is  improvement in her symptoms.   DISCHARGE MEDICATIONS:  1. Coreg 6.25 mg p.o. b.i.d.  2. Avapro 150 mg p.o. daily.  3. Spironolactone 12.5 mg p.o. daily.  4. Digoxin 0.125 mg p.o. daily.  5. Lasix 40 mg p.o. daily.  6. Potassium chloride 20 mEq p.o. daily.  7. Iron sulfate 325 mg p.o. b.i.d.  8. Omeprazole 40 mg p.o. daily.  9. Augmentin 875 mg p.o. b.i.d.  for 5 days.  10.Vicodin one to two q.4-6h. p.r.n. for pain.  11.Aspirin 325 mg daily.  12.Erythromycin eye drop both eyes, five more days.   DISCHARGE INSTRUCTIONS:  1. Follow up with primary care physician in 1 week  2. Check BMET in 1-2 weeks.  3  Follow up with Dr. Alanda Amass, [facility], in 1  week.  1. Follow up with dentist, Dr. Kennyth Arnold Green/or Dr. Pincus Badder, telephone      numbers [phone], respectively.   DIET:  Low-salt diet with fluid restriction, heart-healthy diet.      Mobolaji B. Corky Downs, M.D.  Electronically Signed     MBB/MEDQ  D:  02/07/2007  T:  02/08/2007  Job:  [id number]   cc:   Gerlene Burdock A. Alanda Amass, M.D.  Family Medicine Teaching Service

## 2010-10-05 NOTE — Assessment & Plan Note (Signed)
OFFICE VISIT   [name], [name]  DOB:  [birth year]                                        January 19, 2009  CHART #:  [id number]   HISTORY OF PRESENT ILLNESS:  The patient returns to the office today for  routine followup status post mitral valve repair and aortic valve repair  on December 23, 2008.  Her postoperative recovery has been entirely  uncomplicated.  Following hospital discharge, she has continued to  gradually improve.  She has not yet seen Dr. Garen Lah in followup, and  today's visit is her first office visit since hospital discharge.  She  reports that overall she is doing well.  She notes that last week she  did have one episode where she pulled up suddenly while she was getting  out of a family car with her shoulders and she developed some soreness,  which persisted for several hours afterwards.  Ultimately, the pain was  relieved with pain relievers.  She has not had any shortness of breath.  She feels some occasional tightness across her chest without shortness  of breath, and the tightness seems to be primarily related to physical  movement or use of her arms or shoulders.  She still has some difficulty  getting comfortable when she sleeps at night due to discomfort in her  chest.  She has no orthopnea.  She has not had any lower extremity  edema.  She has not had any palpitations.  Her appetite is good.  She  has no other complaints.   MEDICATIONS:  Medications remain unchanged from the time of hospital  discharge.   PHYSICAL EXAMINATION:  General:  Notable for well-appearing female.  Vital Signs:  Blood pressure 126/69, pulse 53 and regular, oxygen  saturation 100% on room air.  Chest:  Examination of the chest reveals a  median sternotomy incision that is healing nicely.  The sternum is  stable on palpation.  Breath sounds are clear to auscultation and  symmetrical bilaterally.  No wheezes or rhonchi are noted.  Cardiovascular:   Notable for regular rate and rhythm.  There is a  systolic murmur that is somewhat honking in character, heard best along  the sternal border with radiation across precordium.  It is soft.  No  diastolic murmurs are noted.  Abdomen:  Soft, nontender.  Extremities:  Warm and well perfused.  Extremities:  There is no lower extremity  edema.  Rest of her physical exam is unrevealing.   DIAGNOSTIC TESTS:  Chest x-ray performed today at the [facility] is reviewed.  This demonstrates clear lung fields bilaterally.  There are no pleural effusions.  The cardiac silhouette remains  enlarged, but stable from previous exams.  No other abnormalities are  noted.   IMPRESSION:  Satisfactory progress following combined mitral and aortic  valve repair.   PLAN:  I have encouraged the patient to continue to slowly increase her  physical activity, but I have cautioned her to be careful to refrain  from any heavy lifting or strenuous use of her arms and shoulders.  I  think she would do well by getting enrolled in the cardiac rehab  program.  We have not made any changes to her current medications.  All  of her questions have been addressed.  We will plan to see her  back in 8  weeks to make sure that she continues to recover uneventfully.   Salvatore Decent. Cornelius Moras, M.D.  Electronically Signed   CHO/MEDQ  D:  01/19/2009  T:  01/20/2009  Job:  [id number]   cc:   Sheliah Mends, MD

## 2010-10-05 NOTE — Group Therapy Note (Signed)
[name], KOTZ NO.:  [id number]   MEDICAL RECORD NO.:  [id number]          PATIENT TYPE:  WOC   LOCATION:  [facility]                   FACILITY:  [facility]   PHYSICIAN:  Johnella Moloney, MD        DATE OF BIRTH:  November 06, 1960   DATE OF SERVICE:  09/05/2007                                  CLINIC NOTE   REASON FOR VISIT:  Referral from [facility] visit for abnormal uterine bleeding  and fibroids.   HISTORY OF PRESENT ILLNESS:  This is a 50 year old African American  female who presents for followup after recent [facility] visit in March 2009  for abnormal uterine bleeding.  The patient reports that at that time,  she had been for nearly a year without having a menstrual period or  bleeding before subsequently beginning to have heavy bleeding with  clotting which began sometime around August 04, 2007.  It lasted for  approximately 7 days before stopping, and she had a second episode which  lasted 3 days and required a second [facility] visit.  She presents for  followup today for endometrial biopsy.  Patient did have pelvic  ultrasound done at [facility] which showed overall uterine measurements of 11.5  x 8.1 x 7.1 cm.  There were multiple, predominantly intramural,  partially subserosal, hyperechoic uterine masses, likely fibroids, with  the largest being posteriorly and measuring 4.4 cm in greatest diameter.  Endometrial stripe measured 9 mm and was uniformly echogenic.  Ovaries  were normal.   The patient reports that her abdominal pain and bleeding have now  stopped.  However, she continues to have intermittent abdominal pain  which she thinks is related to the uterine fibroids.  She denies any  recent illness, fever, chills, nausea, vomiting, diarrhea, bloody  stools, chest pain or shortness of breath.   The patient reports her last Pap smear was approximately 1-1/2 years ago  by Dr. Ronne Binning who she no longer sees, and it was reportedly normal.  She denies any history of abnormal Pap  smears.  Her last mammogram was  in 2003 and was reportedly normal.  Patient denies any abnormal breast  findings including lumps, bumps, lymphadenopathy, skin changes or nipple  discharge.   PAST MEDICAL HISTORY:  Significant for congestive heart failure on  multiple medications as well as high blood pressure.  The patient is to  establish at [facility] for her chronic medical issues.  She was recently hospitalized and initiated on multiple medications and  has not yet established with a primary care physician.   FAMILY HISTORY:  Significant for diabetes, heart disease, heart attack  and high blood pressure.   SOCIAL HISTORY:  Patient denies any tobacco, alcohol or drug use.  She  denies any problems with depression, anxiety, drug addiction, sexual or  physical abuse.   MEDICATIONS:  Include potassium, carvedilol, digoxin, omeprazole, iron  sulfate, Avapro, ProAir, Flovent, furosemide and Motrin.   ALLERGIES:  No known drug allergies.   OB/GYN HISTORY:  The patient had onset of menses at age 50 and had  regular menstrual cycles every 28 days until approximately 1 year ago  when she stopped having menses.  She did have heavy bleeding and  significant cramping with her menses prior to them stopping.  She is  status post bilateral tubal ligation for contraception.  She is a G3, P2-  0-1-2, status post 2 cesarean sections with her last pregnancy being 12  years ago.  Last Pap smear and mammogram are as above.   PHYSICAL EXAMINATION:  VITAL SIGNS:  Temperature 97.5, heart rate 78,  blood pressure 160/75, weight 129.4 pounds, height 59-1/2 inches,  respiratory rate 20.  IN GENERAL:  The patient is a well-nourished, well-developed female in  no acute distress.  CARDIOVASCULAR:  Heart is regular rate and rhythm with no murmurs, rubs,  or gallops and 2+ dorsalis pedis pulses.  LUNGS:  Clear to auscultation bilaterally, normal work of breathing and  no wheezes, rales or  rhonchi.  ABDOMEN:  Normoactive bowel sounds, soft and nontender, nondistended.  EXTREMITIES:  Have no clubbing, cyanosis or edema.  BREASTS:  Have normal pendulous breast tissue bilaterally with no skin  dimpling or nipple discharge.  No axillary lymphadenopathy.  No palpable  nodules or masses.  GENITOURINARY:  Patient has normal external female genitalia.  Vaginal  mucosa is pink and moist and without lesions.  Cervix was unable to be  visualized after multiple attempts with both Graves and Pederson  speculums as it was palpated and is extremely anterior; patient has  __________ small pelvic girdle.  Patient was unable to tolerate further  examinations.  Wet prep was obtained as well as a blind Pap smear which  will likely be inadequate.  Bimanual exam was also difficult given  patient's inability to relax secondary to discomfort.  NEUROLOGICAL:  Grossly intact.   ASSESSMENT/PLAN:  This is a 50 year old African American female with  congestive heart failure and hypertension who presents as a new patient  for evaluation for abnormal uterine bleeding with the intent of  receiving endometrial biopsy.  1. Abnormal uterine bleeding:  Patient needs endometrial biopsy.      However, cervix was unable to be visualized today after multiple      attempts with both Graves and Pederson speculums.  Patient was      unable to tolerate further examinations, and, thus, biopsy in the      office setting was not able to be obtained.  Patient will be      scheduled for dilatation and curettage in the operating room as she      clearly needs biopsy given her thickened endometrial stripe of 9      mm.  Procedure was explained to the patient in detail, and she      agrees to proceed with plan.  She will see the scheduler today.  2. Incomplete physical:  Blind Pap smear was obtained as cervix was      unable to be visualized as above.  It will likely be inadequate.      Results will be followed up, and  repeat Pap smear will be obtained      in the operating room if necessary.  Patient declined sexually      transmitted disease testing.  Wet prep was obtained due to      extensive white vaginal discharge.  Breast exam was also performed      today and normal.  Patient will be referred for screening mammogram      as her last mammogram was in 2003.  3. Other medical issues including congestive heart failure  and      hypertension:  Advised patient she needs to establish with primary      care physician as soon as possible to follow up on her medications      and blood pressure which is elevated today.  Advised her she will      need to have a visit with her primary care physician prior to going      to the operating room for this procedure.  She will need      preoperative EKG and chest x-ray.   FOLLOWUP:  Patient will be scheduled for D&C in the operating room due  to inability for adequately performing exam in the office.   Erin P. Deirdre Peer, MD, Resident, dictating for attending physician, Johnella Moloney, MD     ______________________________  Johnella Moloney, MD    ______________________________  Johnella Moloney, MD    UD/MEDQ  D:  09/05/2007  T:  09/05/2007  Job:  [id number]

## 2010-10-05 NOTE — Discharge Summary (Signed)
[name], [name] NO.:  [id number]   MEDICAL RECORD NO.:  [id number]          PATIENT TYPE:  INP   LOCATION:  2028                         FACILITY:  [facility]   PHYSICIAN:  Salvatore Decent. Cornelius Moras, M.D. DATE OF BIRTH:  07/16/1960   DATE OF ADMISSION:  12/23/2008  DATE OF DISCHARGE:                               DISCHARGE SUMMARY   PRIMARY ADMITTING DIAGNOSIS:  Exertional shortness of breath.   ADDITIONAL/DISCHARGE DIAGNOSES:  1. Severe mitral regurgitation.  2. Moderate aortic insufficiency.  3. Moderate left ventricular dysfunction with global hypokinesis,      ejection fraction estimated at 35-40%.  4. Chronic systolic and diastolic congestive heart failure.  5. Takotsubo cardiomyopathy.  6. Hypertension.  7. Obesity.  8. History of thyroid mass.  9. History of uterine fibroids with menorrhagia.  10.History of tobacco abuse, quit smoking 2 years ago.  11.Perioperative urinary tract infection (Escherichia coli).  12.Acute blood loss anemia.   PROCEDURES PERFORMED:  1. Median sternotomy.  2. Mitral valve repair with 26-mm Sorin MEMO 3D ring annuloplasty.  3. Aortic valve repair with suture plication of all 3 commissures.   HISTORY:  The patient is a 50 year old female with a long history of  chronic systolic and diastolic congestive heart failure.  The patient  has been followed by Dr. Alanda Amass since about 2007, and her CHF has  been previously attributed to takotsubo cardiomyopathy.  She also has a  known history of mitral regurgitation.  In January 2010, she required  hospitalization for an exacerbation of CHF with acute blood loss anemia  secondary to her uterine fibroids.  She has been treated medically since  that time but has continued to remain symptomatic.  A followup  echocardiogram demonstrated severe MR.  A TEE confirmed the presence of  severe MR and moderate aortic insufficiency.  She subsequently has  undergone left and right heart  catheterization on November 27, 2008, which  showed normal coronary arteries.  No significant coronary artery disease  and moderate-to-severe pulmonary hypertension with PA pressures  measuring 62/26 with pulmonary capillary wedge pressure of 45 and large  V-wave.  Central venous pressure was 7.  Ejection fraction was estimated  at 45%.  She had previously been evaluated by Dr. Tyrone Sage and was noted  to have poor dentition and was subsequently referred for dental  consultation.  She underwent a full dental extraction on December 01, 2008.  She has recovered well from this and returned to the [facility] office for  evaluation by Dr. Cornelius Moras for consideration of mitral valve repair or  replacement and aortic valve intervention.  Dr. Cornelius Moras reviewed her films  and felt that she would benefit at this point from surgical intervention  and explained all risks, benefits, and alternatives of surgery to the  patient.  She agreed to proceed.  For a variety of medical and social  reasons, she was felt to be a poor candidate for long-term  anticoagulation with Coumadin and this was also explained to the patient  at the time of consultation.  She agreed to proceed with surgery.   HOSPITAL COURSE:  She was admitted  to [facility] on the day of  this admission and was taken to the operating room where she underwent a  median sternotomy for mitral valve repair and aortic valve repair as  above.  Please see previously dictated operative report for complete  details of surgery.  She tolerated the procedure well and was  transferred to the SICU in stable condition.  She was able to be  extubated shortly after surgery.  She was hemodynamically stable and  doing well on postop day #1.  She did have an episode of hypotension  while in the unit and required a brief course of a Neo-Synephrine drip.  Her blood pressure, however, did subsequently stabilized and this was  discontinued.  By postop day #2, she was off all drips  and she was ready  for transfer to the [facility].  Overall, her postoperative course  has been uneventful.  She has maintained normal sinus rhythm and has  remained afebrile with stable vital signs.  She has been started on a  beta-blocker and is tolerating this without problem.  Her preoperative  UA was positive for E coli and she has been started on Cipro.  Also her  blood pressures have been trending upward, and she has been restarted on  lisinopril which she had taken previously at home.  She has been volume  overloaded postoperatively and has been started on Lasix to which she is  responding well.  On postop day #3, she remains approximately 6 kg above  her preoperative weight with some mild lower extremity edema on physical  exam.  She is ambulating with Cardiac Rehab Phase I and is making good  progress.  She is tolerating a regular diet and is having normal bowel  and bladder function.  Her postop day #3 labs show hemoglobin of 9.5,  hematocrit 28.7, white count 8.6, platelets 130, BUN 15, creatinine  1.24.  She will have a repeat BMET and a CBC on the morning of December 27, 2008.  Her chest tubes have been removed in the standard fashion, and  her followup chest x-ray has remained stable with bibasilar atelectasis  and small pleural effusions.  It is anticipated that if she continues to  progress as expected she will be ready for discharge home within the  next 48 hours.   DISCHARGE MEDICATIONS:  Please see discharge medication manager in E-  chart for details.   DISCHARGE INSTRUCTIONS:  She is asked to refrain from driving, heavy  lifting, or strenuous activity.  She may continue ambulating daily and  using her incentive spirometer.  She may shower daily and clean her  incisions with soap and water.  She will continue a low-fat, low-sodium  diet.   DISCHARGE FOLLOWUP:  She will need to make an appointment to see Dr.  Alanda Amass in 2 weeks for followup.  She will see Dr.  Cornelius Moras in 3 weeks  with a chest x-ray.  In the interim if she experiences any problems or  has questions, she is asked to contact our office immediately.      Coral Ceo, P.A.      Salvatore Decent. Cornelius Moras, M.D.  Electronically Signed    GC/MEDQ  D:  12/26/2008  T:  12/27/2008  Job:  [id number]   cc:   Gerlene Burdock A. Alanda Amass, M.D.  Dineen Kid Reche Dixon, M.D.  Sheliah Mends, MD

## 2010-10-05 NOTE — Op Note (Signed)
NAMEHILDRETH, ORSAK NO.:  [id number]   MEDICAL RECORD NO.:  [id number]          PATIENT TYPE:  INP   LOCATION:  2305                         FACILITY:  [facility]   PHYSICIAN:  Salvatore Decent. Cornelius Moras, M.D. DATE OF BIRTH:  11/03/1960   DATE OF PROCEDURE:  12/23/2008  DATE OF DISCHARGE:                               OPERATIVE REPORT   PREOPERATIVE DIAGNOSES:  1. Severe mitral regurgitation.  2. Moderate aortic insufficiency.   POSTOPERATIVE DIAGNOSES:  1. Severe mitral regurgitation.  2. Moderate aortic insufficiency.   PROCEDURE:  Median sternotomy for mitral valve repair (26-mm Sorin MEMO  3D ring annuloplasty) and aortic valve repair (suture plication of all 3  commissures).   SURGEON:  Salvatore Decent. Cornelius Moras, MD   ASSISTANT:  Stephanie Acre. Dasovich, PA   ANESTHESIA:  General.   BRIEF CLINICAL NOTE:  The patient is a 50 year old obese African  American female from [place] with longstanding history of chronic  systolic and diastolic congestive heart failure.  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  The patient also has  known history of severe mitral regurgitation.  In January 2010, the  patient was hospitalized with congestive heart failure that was  exacerbated with acute blood loss anemia secondary to uterine fibroids.  Her bleeding was controlled medically and congestive heart failure  treated medically.  The patient has continued to have problems with  symptomatic congestive heart failure.  A followup echocardiogram was  performed demonstrating severe mitral regurgitation.  Transesophageal  echocardiogram confirmed the presence of severe mitral regurgitation and  moderate aortic insufficiency.  Left and right heart catheterization  demonstrates normal coronary artery anatomy with no significant coronary  artery disease, but moderate-to-severe pulmonary hypertension.  Left  ventricular function is moderately reduced.  A full  consultation has  been dictated previously.  The patient and her family have been  counseled at length regarding the indications, risks, and potential  benefits of surgical intervention.  Alternative treatment strategies  have been discussed in detail.  For a variety of medical and social  reasons, the patient was felt to be a relatively poor candidate for long-  term anticoagulation with Coumadin.  The patient understands that in the  event that either the aortic or mitral valve cannot be repaired  satisfactorily, she was not felt to be candidate for placement of a  mechanical prosthesis under the circumstances.  All of her questions  have been addressed.   OPERATIVE FINDINGS:  1. 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  2. Moderate left ventricular dysfunction with global hypokinesis,      ejection fraction estimated 35-40%.  3. No residual mitral regurgitation and mild residual aortic      insufficiency after repair of both valves.   OPERATIVE NOTE IN DETAIL:  The patient was brought to the operating room  on the above-mentioned date, and central monitoring was established by  the Anesthesia Team under the care and direction of Dr. Adonis Huguenin.  Specifically, a Swan-Ganz catheter was placed through the left internal  jugular  approach.  A radial arterial line was placed.  Intravenous  antibiotics were administered.  The patient was additionally  administered intravenous ciprofloxacin due to abnormal urinalysis prior  to surgery with pending urine culture.  General endotracheal anesthesia  was induced uneventfully.  A Foley catheter was placed.  The patient's  anterior chest, abdomen, both groins, and both lower extremities were  prepared and draped in sterile manner.   Baseline transesophageal echocardiogram was performed by Dr. Krista Blue.  This confirmed the presence of at least moderate aortic insufficiency   and moderate-to-severe mitral regurgitation.  The aortic valve was  tricuspid and there was mild thickening of 2 of the 3 cusps of the  valve.  The aortic insufficiency jet was central and filled more than  two-thirds of the left ventricle.  Mitral regurgitation appeared to be  secondary to primarily leaflet restriction of the posterior leaflet of  the mitral valve (type IIIA) with some annular dilatation (type IA).  There was no mitral valve prolapse.  Some of the subvalvular apparatus  was mildly thickened, but overall the leaflets themselves look  reasonably thin and there was no significant calcification.  There was  moderate left ventricular dysfunction with global hypokinesis.  There  was trace to mild tricuspid regurgitation.  No other abnormalities were  noted.   A median sternotomy incision was performed.  The pericardium was opened.  The ascending aorta was normal in appearance.  The patient was  heparinized systemically.  The ascending aorta was cannulated just  beyond the takeoff of the innominate artery.  A retrograde cardioplegic  cannula was placed through the right atrium into the coronary sinus.  A  small stab incision was made in the right inguinal crease.  The right  common femoral vein was cannulated with a Seldinger technique, and a  long flexible guidewire was advanced up through the femoral vein, the  inferior vena cava through the right atrium and to the superior vena  cava using transesophageal echocardiogram for guidance.  The femoral  vein was dilated with serial dilators, and a 22-French long femoral  venous cannula was advanced over the guidewire through the inferior vena  cava up through the right atrium until the tip to the cannula extends  just into the superior vena cava.  Adequate heparinization was verified.   Cardiopulmonary bypass was begun.  Vacuum-assist venous drainage was  utilized.  A 20-French right angle metal tip venous cannula was placed   directly in the superior vena cava to facilitate bicaval drainage.  An  antegrade cardioplegic cannula was placed in the ascending aorta.  A  temperature probe was placed in the left ventricular septum.   The patient was allowed to cool passively to 28 degrees systemic  temperature.  The aortic cross-clamp was applied and cold blood  cardioplegia was delivered initially in antegrade fashion through the  aortic root.  A combination of retrograde and antegrade cardioplegia was  administered.  The initial cardioplegic arrest and myocardial cooling  was felt to be excellent and rapid with early diastolic arrest.  There  was significant aortic regurgitation mandating alternation with  retrograde cardioplegia.  Repeat doses of cardioplegia were administered  intermittently throughout the entire cross-clamp portion of the  operation, every 20 minutes to maintain completely flat  electrocardiogram and left ventricular septal myocardial temperature  below 15 degrees centigrade.   A left atriotomy incision was performed posteriorly through the  interatrial groove and extended partway across the back wall of the left  atrium after opening the oblique sinus.  The floor of the left atrium  and the mitral valve were exposed using a self-retaining retractor.  Exposure was felt to be excellent.  The mitral valve was carefully  examined.  There was mild thickening and foreshortening of the primary  chords to the posterior leaflet of the mitral valve, all suggestive of  rheumatic mitral valve disease.  However, the entire posterior leaflet  and the anterior leaflet remained mobile and only mildly thickened.  The  jet of mitral regurgitation was clearly central, primarily related to  functional restriction as well as mild annular dilatation.  Due to  adequate leaflet mobility, simple ring annuloplasty was felt to be  sufficient.   The mitral valve repair was completed using interrupted 2-0 Ethibond   horizontal mattress sutures placed circumferentially around the mitral  annulus.  The mitral valve was not tested with saline and appeared to be  competent even without annuloplasty ring in place once the sutures were  suspended symmetrically.  The valve was sized to a 26-mm annuloplasty  ring, which corresponded fairly precisely to the dimensions of the  anterior leaflet.  A Sorin MEMO 3D annuloplasty ring (catalog number  [id number], serial number [id number]) was secured in place uneventfully.  After  completion of valve repair, the valve was tested with saline and  appeared to be perfectly competent.  There was a broad symmetrical line  of coaptation of the anterior and posterior leaflet with no residual  leak whatsoever.   A left ventricular vent was placed across the mitral valve, and the left  atriotomy incision was closed posteriorly with a 2-layer closure of  running 3-0 Prolene suture.   An oblique aortotomy incision was performed.  The aortic valve was  inspected.  The aortic valve was tricuspid.  There was thickening and  fibrosis of the right and noncoronary leaflet of the aortic valve with  gross characteristics classical for rheumatic aortic valve disease.  However, the valve remained symmetrical overall.  There were no areas of  prolapse.  All 3 leaflets remained mobile.  There was no calcification.  The patient was felt to be poor candidate for long-term anticoagulation  with Coumadin, and gross findings were suggestive of the possibility  that valve repair may be at least as durable if not more so than a  bioprosthetic tissue valve.   The aortic valve repair was accomplished using pledgeted 2-0 Ethibond  mattress sutures to plicate each of the 3 commissures in a horizontal  vertical mattress fashion.  After plication of all 3 commissures, the  valve was again tested and appeared to be competent with increased  coaptation of the 3 leaflets in a symmetrical fashion.  Rewarming  was  begun.   The aortotomy was closed using a 2-layer closure of running 4-0 Prolene  suture.  One final dose of warm retrograde hot shot cardioplegia was  administered.  The lungs were ventilated and heart allowed to fill to  evacuate any residual air.  Magoon needle was placed in the ascending  aorta to serve as a root vent.  The aortic cross-clamp was removed after  total cross-clamp time of 96 minutes.   The heart was defibrillated and a spontaneous rhythm resumed.  Epicardial pacing wires fixed to the undersurface of the right  ventricular free wall into the right atrial appendage.  The retrograde  cardioplegic cannula was removed.  The patient was rewarmed to 37  degrees centigrade temperature.  The superior vena  cava cannula was  removed.  After satisfactory de-airing had been accomplished, the left  ventricular vent was removed.  Normal sinus rhythm resumed, and  pacemaker was discontinued.   The patient was weaned from cardiopulmonary bypass without difficulty.  The patient's rhythm at separation from bypass was normal sinus rhythm.  No inotropic support was required.  Total cardiopulmonary bypass time of  the operation was 140 minutes.  Followup transesophageal echocardiogram  performed by Dr. Krista Blue and after separation from bypass demonstrated no  residual mitral regurgitation with a well-seated annuloplasty ring in  the mitral position.  There remains mild (1+) aortic insufficiency.  The  jet of aortic insufficiency was dramatically less than preoperatively  and only extended a short distance into the left ventricular chamber.  The jet remained central.  The leaflets all appeared to be opening and  closing normally.  There was no significant gradient appreciated across  aortic valve or the mitral valve.   Protamine was administered to reverse the anticoagulation.  The aortic  cannula was removed uneventfully.  The femoral venous cannula was  removed uneventfully and  the groin was controlled with manual pressure  for 30 minutes.  The mediastinum was irrigated with saline solution.  Meticulous surgical hemostasis ascertained.  The mediastinum and the  right pleural space were drained using 3 chest tubes exited through  separate stab incisions inferiorly.  The pericardium was reconstructed  with a patch of core matrix bovine submucosal tissue patch.  The sternum  was closed using double-strength sternal wire.  Soft tissues anterior to  the sternum were closed in multiple layers, and the skin was closed with  running subcuticular skin closure.   The patient tolerated the procedure well and was transported to the  Surgical Intensive Care Unit in stable condition.  There were no  intraoperative complications.  All sponge, instrument, and needle counts  were verified correct at completion of the operation.  No blood products  were administered.      Salvatore Decent. Cornelius Moras, M.D.  Electronically Signed     CHO/MEDQ  D:  12/23/2008  T:  12/24/2008  Job:  [id number]   cc:   Sheliah Mends, MD

## 2010-10-05 NOTE — Cardiovascular Report (Signed)
NAMEALAA, [name] NO.:  [id number]   MEDICAL RECORD NO.:  [id number]          PATIENT TYPE:  OIB   LOCATION:  3731                         FACILITY:  [facility]   PHYSICIAN:  Sheliah Mends, MD      DATE OF BIRTH:  07/22/1960   DATE OF PROCEDURE:  11/26/2008  DATE OF DISCHARGE:                            CARDIAC CATHETERIZATION   RIGHT AND LEFT HEART CATHETERIZATION   INDICATION:  [name] is a 50 year old lady with history of severe  mitral regurgitation, moderate aortic regurgitation, mildly reduced left  ventricular function, and severe pulmonary hypertension, who was  referred for cardiac catheterization in preparation for possible mitral  valve repair.   PROCEDURES:  1. Left heart catheterization.  2. Right heart catheterization.  3. Selective coronary angiography.  4. Left ventriculography.   The patient was brought to the second floor cardiac catheterization  laboratory at [facility] in the postabsorptive state.  She was  draped in sterile fashion.  Right arterial and venous access were  obtained in the right groin and the patient received local anesthesia  using 1% lidocaine to the right groin, and subsequently, a 5-French  arterial sheath and a 7-French venous sheath were placed using the  modified Seldinger technique.  Left heart catheterization was performed  using a 5-French #4 right and left Judkins catheters for selective  coronary angiography and a pigtail catheter for left ventriculography.  A 7-French Swan-Ganz catheter was used for right heart catheterization.   FINDINGS:  1. Hemodynamics:  Aortic pressure 129/73 mmHg.  Left ventricular      pressure 123/10 mmHg.  There was no significant aortic valve      gradient.  2. Right heart catheterization:  Right atrium 7 mmHg, right ventricle      65/6 mmHg, pulmonary artery 62/26 mmHg, pulmonary capillary wedge      pressure 45 mmHg.  Cardiac output 2.4.  Cardiac index 1.5.   Coronary angiography findings:  1. Left main coronary artery:  The left main coronary artery is a      short vessel that bifurcates into the LAD and left circumflex      artery.  The left main coronary artery is free of angiographically      significant disease.  2. Left anterior descending artery:  The left anterior descending      artery is a large vessel that wraps around the apex.  It gives off      a high diagonal 1 and diagonal 2 without angiographically      significant disease.  There are multiple very small diagonal      branches in the mid and distal part of the vessel.  3. Left circumflex artery:  The left circumflex artery is a large      vessel.  It gives off a large, branching obtuse marginal branch.      There is no angiographically significant disease seen.  4. Right coronary artery:  The right coronary artery is a large,      dominant vessel that gives off a PDA.  There is no angiographically  significant disease seen in the right coronary artery.   Left ventriculogram:  Left ventriculogram shows mildly reduced left  ventricular function, estimated at 45%.  Estimation of left ventricular  function was complicated by ventricular ectopy.   The procedure was completed without complication.   PLAN:  [name] has a history of severe mitral regurgitation, mildly  reduced left ventricular function, as well as severely elevated right  heart pressures.  She will be referred for Cardiothoracic Surgery  consult for mitral valve repair.  In the interim, she will be continued  on medical therapy with vasodilators.      Sheliah Mends, MD  Electronically Signed     JE/MEDQ  D:  11/26/2008  T:  11/27/2008  Job:  [id number]

## 2010-10-05 NOTE — Op Note (Signed)
NAMEVALENA, IVANOV NO.:  [id number]   MEDICAL RECORD NO.:  [id number]          PATIENT TYPE:  AMB   LOCATION:  SDC                           FACILITY:  [facility]   PHYSICIAN:  Norton Blizzard, MD    DATE OF BIRTH:  07-09-1960   DATE OF PROCEDURE:  DATE OF DISCHARGE:                               OPERATIVE REPORT   PREOPERATIVE DIAGNOSIS:  Abnormal uterine bleeding, inability to do  office examination.   POSTOPERATIVE DIAGNOSIS:  Abnormal uterine bleeding, inability to do  office examination.   PROCEDURE:  Exam under anesthesia, Pap smear, and endometrial biopsy.   SURGEON:  Norton Blizzard, MD   ANESTHESIA:  Monitored anesthesia with light conscious sedation.   ESTIMATED BLOOD LOSS:  Minimal.   FINDINGS:  An 11-week size sharply retroverted uterus, cervix noted to  be very anterior and behind the pubic symphisis.  During the endometrial  biopsy, the uterus sounded to 11 cm.  One pass made for endometrial  biopsy.   SPECIMEN:  Pap smear and endometrial biopsy specimens sent to pathology.   COMPLICATIONS:  None.   PROCEDURE IN DETAIL:  The patient is a 50 year old with abnormal uterine  bleeding.  The patient reported having nearly a year without having a  menstrual period, then started having heavy bleeding and passage of  clots that started around August 04, 2007, and lasted for 7 days.  She  had an ultrasound, which showed an 11-week sized uterus with  predominantly small intramural fibroids.  Her endometrial stripe was  measured at 9 mm, and she had normal adnexae.  The patient was counseled  regarding the need for endometrial biopsy; however, in the clinic, this  was unable to be done given the acute retroverted uterus and inability  to visualize the cervix in the office.  She was then booked to do this  in the operating room for further exposure, and for the pricedure to be  done under conscious sedation.  Written informed consent was obtained.  In  the operating room, the patient was placed in the dorsal lithotomy  position.  Intravenous conscious sedation was administered without  difficulty.  A speculum was placed in the patient's vagina and used to  visualize her cervix.  The Pap smear was then done at this point  collecting both ectocervical and endocervical specimens.  The cervix was  then swabbed with Betadine, and a tenaculum was placed at the posterior  lip of the cervix.  The uterus was sounded  to 11 cm,  and a 3 mm  pipelle was introduced into the uterus, and used to obtain a moderate  amount of tissue.  The patient had minimal bleeding after the biopsy.  The tenaculum was removed and small amount of bleeding was noted at the  tenaculum site, which was controlled using pressure and silver nitrate.  All instruments were then removed from the patient's pelvis.  The  patient tolerated the  procedure well.  Sponge and instrument counts were correct x2.  She was  taken to the recovery room in stable condition.  The patient will follow  up in the GYN Clinic in the next 2 weeks for discussion of results and  further management.      Norton Blizzard, MD  Electronically Signed     UAD/MEDQ  D:  10/08/2007  T:  10/09/2007  Job:  [id number]

## 2010-10-06 ENCOUNTER — Emergency Department (HOSPITAL_COMMUNITY): Payer: Medicaid Other

## 2010-10-06 ENCOUNTER — Inpatient Hospital Stay (HOSPITAL_COMMUNITY)
Admission: EM | Admit: 2010-10-06 | Discharge: 2010-10-10 | DRG: 293 | Disposition: A | Discharge status: Home / Self Care | Payer: Medicaid Other | Attending: Cardiology | Admitting: Cardiology

## 2010-10-06 DIAGNOSIS — I279 Pulmonary heart disease, unspecified: Secondary | ICD-10-CM | POA: Diagnosis present

## 2010-10-06 DIAGNOSIS — E669 Obesity, unspecified: Secondary | ICD-10-CM | POA: Diagnosis present

## 2010-10-06 DIAGNOSIS — E876 Hypokalemia: Secondary | ICD-10-CM | POA: Diagnosis present

## 2010-10-06 DIAGNOSIS — D649 Anemia, unspecified: Secondary | ICD-10-CM | POA: Diagnosis present

## 2010-10-06 DIAGNOSIS — K219 Gastro-esophageal reflux disease without esophagitis: Secondary | ICD-10-CM | POA: Diagnosis present

## 2010-10-06 DIAGNOSIS — I10 Essential (primary) hypertension: Secondary | ICD-10-CM | POA: Diagnosis present

## 2010-10-06 DIAGNOSIS — I498 Other specified cardiac arrhythmias: Secondary | ICD-10-CM | POA: Diagnosis present

## 2010-10-06 DIAGNOSIS — I509 Heart failure, unspecified: Principal | ICD-10-CM | POA: Diagnosis present

## 2010-10-06 DIAGNOSIS — I2510 Atherosclerotic heart disease of native coronary artery without angina pectoris: Secondary | ICD-10-CM | POA: Diagnosis present

## 2010-10-06 LAB — DIFFERENTIAL
Basophils Relative: 0 % (ref 0–1)
Eosinophils Absolute: 0.1 10*3/uL (ref 0.0–0.7)
Eosinophils Relative: 1 % (ref 0–5)
Monocytes Relative: 4 % (ref 3–12)
Neutrophils Relative %: 68 % (ref 43–77)

## 2010-10-06 LAB — COMPREHENSIVE METABOLIC PANEL
ALT: 5 U/L (ref 0–35)
Alkaline Phosphatase: 66 U/L (ref 39–117)
BUN: 11 mg/dL (ref 6–23)
CO2: 24 mEq/L (ref 19–32)
GFR calc non Af Amer: 51 mL/min — ABNORMAL LOW (ref >60)
Glucose, Bld: 78 mg/dL (ref 70–99)
Potassium: 3.8 mEq/L (ref 3.5–5.1)
Sodium: 139 mEq/L (ref 135–145)
Total Bilirubin: 0.3 mg/dL (ref 0.3–1.2)
Total Protein: 6.9 g/dL (ref 6.0–8.3)

## 2010-10-06 LAB — PROTIME-INR: Prothrombin Time: 13.2 seconds (ref 11.6–15.2)

## 2010-10-06 LAB — URINALYSIS, ROUTINE W REFLEX MICROSCOPIC
Glucose, UA: NEGATIVE mg/dL
Ketones, ur: NEGATIVE mg/dL
Nitrite: NEGATIVE
Protein, ur: NEGATIVE mg/dL
Urobilinogen, UA: 1 mg/dL (ref 0.0–1.0)

## 2010-10-06 LAB — CBC
MCH: 27.7 pg (ref 26.0–34.0)
MCV: 85.4 fL (ref 78.0–100.0)
Platelets: 177 10*3/uL (ref 150–400)
RBC: 4.04 MIL/uL (ref 3.87–5.11)
RDW: 15.8 % — ABNORMAL HIGH (ref 11.5–15.5)
WBC: 7.3 10*3/uL (ref 4.0–10.5)

## 2010-10-06 LAB — POCT CARDIAC MARKERS: Troponin i, poc: <0.05 ng/mL (ref 0.00–0.09)

## 2010-10-07 ENCOUNTER — Inpatient Hospital Stay (HOSPITAL_COMMUNITY): Payer: Medicaid Other

## 2010-10-07 LAB — LIPID PANEL
Cholesterol: 275 mg/dL — ABNORMAL HIGH (ref 0–200)
Total CHOL/HDL Ratio: 5.6 RATIO
Triglycerides: 114 mg/dL (ref <150)
VLDL: 23 mg/dL (ref 0–40)

## 2010-10-07 LAB — CBC
HCT: 36.6 % (ref 36.0–46.0)
MCHC: 33.3 g/dL (ref 30.0–36.0)
MCV: 84.5 fL (ref 78.0–100.0)
RDW: 15.6 % — ABNORMAL HIGH (ref 11.5–15.5)

## 2010-10-07 LAB — BASIC METABOLIC PANEL
CO2: 26 mEq/L (ref 19–32)
Chloride: 105 mEq/L (ref 96–112)
GFR calc Af Amer: >60 mL/min (ref >60)
Potassium: 3.3 mEq/L — ABNORMAL LOW (ref 3.5–5.1)
Sodium: 139 mEq/L (ref 135–145)

## 2010-10-07 LAB — CARDIAC PANEL(CRET KIN+CKTOT+MB+TROPI): Troponin I: <0.3 ng/mL (ref <0.30)

## 2010-10-07 LAB — HEMOGLOBIN A1C: Hgb A1c MFr Bld: 5.8 % — ABNORMAL HIGH (ref <5.7)

## 2010-10-08 LAB — BASIC METABOLIC PANEL
CO2: 31 mEq/L (ref 19–32)
GFR calc non Af Amer: 34 mL/min — ABNORMAL LOW (ref >60)
Glucose, Bld: 102 mg/dL — ABNORMAL HIGH (ref 70–99)
Potassium: 3.3 mEq/L — ABNORMAL LOW (ref 3.5–5.1)
Sodium: 138 mEq/L (ref 135–145)

## 2010-10-08 LAB — T3: T3, Total: 13.5 ng/dl — ABNORMAL LOW (ref 80.0–204.0)

## 2010-10-08 LAB — T4, FREE: Free T4: 0.44 ng/dL — ABNORMAL LOW (ref 0.80–1.80)

## 2010-10-08 NOTE — Discharge Summary (Signed)
Colleen, Hale NO.:  [id number]   MEDICAL RECORD NO.:  [id number]          PATIENT TYPE:  INP   LOCATION:  3705                         FACILITY:  [facility]   PHYSICIAN:  [name]. Hensel, M.D.DATE OF BIRTH:  1960/07/26   DATE OF ADMISSION:  10/29/2006  DATE OF DISCHARGE:  10/31/2006                               DISCHARGE SUMMARY   DISCHARGE DIAGNOSES:  1. Chest pain.  2. Shortness of breath/cough.  3. Anemia.  4. Gastroesophageal reflux disease.  5. History of Takotsubo syndrome with congestive heart failure with      ejection fraction of 35%.  6. Hypertension.   DISCHARGE MEDICATIONS:  1. Doxycycline 100 mg twice daily x6 days.  2. Lasix 40 mg daily.  3. Avapro 150 mg daily.  4. KCl 20 mEq daily.  5. Coreg 6.25 mg twice daily.  6. Albuterol MDI 2 puffs inhaled every 4 hours as needed for cough and      wheeze.  7. Spironolactone 12.5 mg daily.  8. Omeprazole 40 mg daily.  9. Iron sulfate 325 mg twice daily.  10.Digoxin 0.125 mg daily.   CONSULTS:  None.   PROCEDURE:  A 2D echocardiogram showed a mild to moderately dilated left  ventricle with an EF of 50 to 55%, no left ventricular wall motion  abnormalities, mildly increased left ventricular wall thickness,  moderate diastolic dysfunction, mild aortic valve regurg, moderate to  severe mitral valve regurg, mild left atrial dilatation.   ADMISSION LABORATORIES:  White blood count of 9.6, hemoglobin of 7.9,  hematocrit of 26.7, platelets of 388,000.  Digoxin level less than 0.2.  A BNP is 1061.  Cardiac enzymes with a troponin I of 0.06.  Retic count  of 3.1.  Fecal occult blood negative.  Cardiac panel is negative x2.  Pregnancy test negative.   DISCHARGE LABORATORIES:  White blood count of 10.8, hemoglobin of 9.9,  hematocrit of 32.3, platelets 228,000.  Sodium 135, potassium 3.8,  chloride 104, bicarb 26, glucose 85, BUN 11, creatinine 1.27, calcium  8.4.  TSH 2.423.  Iron level 14, total  iron binding capacity of 297% sat  of 5.  Ferritin of 13.  Folate of 8.4.   HOSPITAL COURSE:  [name] is a 50 year old African-American female  with history of CHF with an EF of 35% secondary to Takotsubo syndrome  who presented with a 1 month history of progressive shortness of breath  with exertion, being unable to walk from her house to the mailbox  without resting.  She had additionally complained of 3-pillow orthopnea  and intermittent P and D.  She also was complaining of severe cough that  had been progressing over the past month to the point that she has daily  post tussive emesis.  She has a cough at baseline, which is usually  productive of sputum, but is currently productive of brown sputum.  She  also complained of wheezing, particularly at night with chest tightness.  Heat and strong odors exacerbate her shortness of breath, cough, and  wheezing.  She is also complaining of substernal chest pain sometimes  associated with exertion,  relieved by rest, although most often  associated with cough.  By problem:  1. Chest pain:  The patient's chest pain was felt to be most likely      secondary chest wall pain from her cough with a possible component      of gastroesophageal reflux disease.  However, given her cardiac      history and description of her chest pain with exertion relieved by      rest, she was admitted to rule out myocardial infarction by cycling      cardiac enzymes and obtaining an echocardiogram.  Her cardiac      enzymes showed troponin of 0.06, which is very mildly elevated.      However, given her comorbidity of slightly elevated creatinine and      likely COPD exacerbation, resolution of her chest pain, and no      changes in EKG, it was felt like her chest pain was unlikely      cardiac in nature.  The patient was not, in fact, having any      cardiac ischemia.  The patient's pain was additionally relieved      with a GI cocktail and restarting of her  proton pump inhibitor.  2. Shortness of breath/cough:  It was difficult during the      hospitalization to piece out the exact etiology of the patient's      shortness of breath.  Very likely, the patient has a combination of      some congestive heart failure secondary to moderate diastolic      dysfunction seen on echocardiogram, and COPD exacerbation given her      smoking history and cough productive of brown sputum.  The patient      was continued on her home dose of Lasix as she did not appear to be      volume overloaded.  Repeat 2D echocardiogram did show improvement      in her ejection fraction from 35% to 50 to 55%, although it did      show moderate diastolic dysfunction, which could certainly      exacerbate her shortness of breath.  In addition, the patient was      started on albuterol MDI with some relief of her shortness of      breath and wheezing.  She was also started on doxycycline 100 mg      twice daily for a 7 day course to treat what was felt to be a COPD      exacerbation.  It is recommended that she have formal PFTs as an      outpatient in follow up, as well as continued follow up with her      cardiologist regarding her Takotsubo syndrome, moderate diastolic      dysfunction, and moderate mitral regurg found on 2D echo.  3. Anemia:  The patient was found to be severely anemic with a      hemoglobin nader of 7.2 during the hospital stay.  This could also      have been contributing to her shortness of breath.  She was      transfused 2 units of packed red blood cells with improvement in      her symptoms.  She was discharged home on iron supplementation for      her severe anemia secondary to menorrhagia.   The patient was to follow up with Dr. Luz Brazen at [facility] following discharge.      [name], M.D.  Electronically Signed      [name]. [name], M.D.  Electronically Signed    JH/MEDQ  D:  12/05/2006  T:   12/06/2006  Job:  [id number]

## 2010-10-08 NOTE — Cardiovascular Report (Signed)
[name], [name] NO.:  [id number]   MEDICAL RECORD NO.:  [id number]          PATIENT TYPE:  INP   LOCATION:  2112                         FACILITY:  [facility]   PHYSICIAN:  Richard A. Alanda Amass, M.D.DATE OF BIRTH:  1961/05/19   DATE OF PROCEDURE:  10/18/2005  DATE OF DISCHARGE:                              CARDIAC CATHETERIZATION   PROCEDURE:  Retrograde central aortic catheterization, selective coronary  angiography via Judkins technique, LV angiogram RAO, LAO projection, iliac  injection, oblique hand injection, 6-French StarClose nitinol clip, closure  device successful RCFA.   BRIEF HISTORY:  [name] is a very pleasant 50 year old single Afro-  American woman who has 2 children, a 3 year old son and an 24 year old  daughter.  She has been under a lot of stress at home because of past social  difficulties with her son who has had a history of gunshot wound and has a  chronic colostomy.  She is not working, was a smoker up until several months  ago, has a history of exogenous obesity, cholesterol status unknown, and a  history of hypertension and GERD.  There is a remote history of peptic ulcer  disease without known GI bleeding.  She has been seen in the emergency room  in the last several months for shortness of breath and cough felt to be  related to possible bronchitis.  She was given a course of antibiotics and  steroids.  Over 3 days prior to admission, she has had dysphagia, difficulty  swallowing and substernal fullness and pressure without radiation.  She  presented to [facility] emergency room on Oct 16, 2005 with profound  anemia, positive guaiac stools and EKG showing inferior Q waves and  nonspecific ST-T changes.  Blood pressure of 160 to 170.  She had elevated  troponin, minimally elevated MB, normal CPKs, negative myoglobin.  Troponin  was elevated to 3.0.  CXR compatible with CHF.  Because of acute GI bleed,  she was not heparinized.   She was started on IV nitroglycerin, beta  blockers, ARB and diuretics.  She diuresed in the hospital and was referred.  A 2-D echo showed segmental wall motion abnormalities and LV dysfunction,  and she was referred for catheterization in this setting for diagnostic  purposes, as well as to help in her management and assess ability for GI  workup.   Informed consent was obtained to proceed with diagnostic angiography.  The  patient was brought to the second floor CP lab in a post absorptive state  after 5 mg Valium p.o. premedication.  The right groin was prepped and  draped in the usual manner.  1% Xylocaine was used for local anesthesia.  She was given fentanyl 25 mcg and 2 mg of Nubain IV for sedation during the  procedure.  The CRFA was entered with single anterior puncture using an 18  thin-wall needle, and a 6-French short Daig side arm sheath was inserted  without difficulty.  Guidewire exchange was used throughout the procedure.  Hydration was continued.  She was on IV nitroglycerin at 5 micro drops per  minute.  The diagnostic coronary  angiography was done with 6-French 4 cm  taper Cordis preformed coronary and pigtail catheters with Omnipaque dye.  Following coronary angiography in multiple projections, LV angiogram was  done in the RAO and LAO projection at 25 cc, 14 cc per second, 20 cc, 12 cc  per second respectively.  Pullback pressure CA showed no gradient across the  aortic valve.  Abdominal aortic angiogram by hand injection in the midstream  PA projection showed normal single renal arteries bilaterally, tortuous  iliacs and no infrarenal stenosis or aneurysm formation.   The __________ iliac angiogram was done by hand injection, the oblique  projection showing good stick into the CRFA, and right femoral closure was  done with a 6-French StarClose nitinol clip closure device successfully.  The patient was transferred to the holding area for postoperative care.   Nitroglycerin was discontinued.   The patient tolerated the procedure well.   PRESSURES:  LV:  120/10; LVEDP 20-22 mmHg.   CA:  120/70 mmHg.   There was no gradient across the aortic valve on catheter pullback.   The main left coronary was normal.   The LAD was widely patent.  Course of the apex of the heart where it  bifurcated was smooth.  No evidence of atherosclerotic disease and no  evidence of spasm or ruptured plaque.   There was a normal DX1 before SP1.  This was of moderate size.  There was a  moderate size bifurcating VX2 that had 20-30% smooth narrowing in the  proximal portion, but no significant stenosis and good flow.   The circumflex artery was an essentially normal tortuous widely patent  vessel that was nondominant, of moderate size, giving off a small normal OM-  1, a moderate-sized OM-2, and bifurcating AV groove branch and PABG branches  were normal.   The right coronary was dominant, widely patent.  There was normal PDA and  normal trifurcating PLA, normal RV branches.  No evidence of any significant  atherosclerotic disease or spasm.   LV angiogram showed hypokinesis of the mid anterolateral wall and the distal  third of the inferior wall.  There was a large area of paradoxical apical  bulge apical ballooning.  In the LAO projection, the posterior wall and  high septum contracted well, and there was hypo-akinesis with apical  paradoxical bulge.  There was no significant mitral regurgitation.   Estimated EF was approximately 35%.   DISCUSSION:  This patient's history is as outlined above.  She has  considerable emotional stress at home.  She has essentially angiographic  normal epicardial coronary arteries.  Her apical ballooning anteroapical and  distal inferoapical hypo-akinesis is compatible with tako-tsubo  syndrome.  This syndrome generally effects young woman or middle-aged woman.  It is most often associated with high degree of stress and may be  somewhat  catecholamine dependent.  There is no clear-cut evidence for coronary spasm  in this case or with tako-tsubo syndrome, but that is one consideration that  is possible, since she was started on IV nitro.  Whether or not this  syndrome is related to small vessel non-epicardial coronary disease is also  not clear.  Medical therapy would be directed towards treatment of LV  dysfunction.  In most cases of tako-tsubo, with medical therapy LV function  improves and often returns to normal or near normal; however, there is a  percentage of patients, 15-25%, that may not improve LV function and may  actually have recurrences.  In this setting, therapy  for LV dysfunction,  including beta blockade, ACE or ARB, diuretics, possible nitrates for  endothelial stabilization may all be helpful.  She will need careful  cardiology follow-up and follow-up outpatient 2-D echoes for assessment of  her LV function.  Generally, prognosis is good.   I believe it is okay to proceed with GI evaluation including endoscopy.   CATHETERIZATION DIAGNOSIS:  1.  Several days of intermittent chest pain, historically most compatible      with upper GI disease, possible esophageal stricture and/or peptic ulcer      disease, with subacute GI bleed.  2.  Recent steroid use history and aspirin-Excedrin.  3.  High degree of emotional stress at home.  4.  Exogenous obesity.  5.  Chronic cigarette abuse; recent discontinuation.  6.  Left ventricular dysfunction with acute onset of congestive heart      failure, felt to be related to non epicardial coronary disease,      compatible with tako-tsubo syndrome.  7.  Essentially normal coronary arteries.  8.  Hypertension since age 89 on medical therapy; normal renal arteries.  9.  Exogenous obesity.      Richard A. Alanda Amass, M.D.  Electronically Signed     RAW/MEDQ  D:  10/18/2005  T:  10/18/2005  Job:  [id number]   cc:   Lorelle Formosa, M.D.  Fax:  [phone]   CP Lab

## 2010-10-08 NOTE — H&P (Signed)
[name], [name]            ACCOUNT NO.:  [id number]   MEDICAL RECORD NO.:  [id number]          PATIENT TYPE:  INP   LOCATION:  0101                         FACILITY:  [facility]   PHYSICIAN:  Jonna L. Robb Matar, M.D.DATE OF BIRTH:  07-23-1960   DATE OF ADMISSION:  10/16/2005  DATE OF DISCHARGE:                                HISTORY & PHYSICAL   PRIMARY CARE PHYSICIAN:  Lorelle Formosa, M.D.   CHIEF COMPLAINT:  Fullness in the chest.   HISTORY:  The patient is a 50 year old, African American, hypertensive  female who has been having episodes of two-to-three-pillow orthopnea for a  year.  She has had a recurrent cough that is worse when she lies down, that  has not been responding to Tussionex.  She had developed edema over the  past. She was here again for an episode of bronchitis two weeks ago. Was put  on prednisone and Tussionex.  She developed problems with chest fullness  with not being able to eat and feeling like things go halfway down and then  get stuck although she has not been having any vomiting, hematemesis, or  melena.  After three days of really not being able to eat, she came in  tonight.   PAST MEDICAL HISTORY:  1.  Hypertension for over 20 years.  2.  Emergency room visits for persistent cough and presumed bronchitis.  3.  History of an ulcer many years ago.   PAST SURGICAL HISTORY:  Cesarean section.   FAMILY HISTORY:  Hypertension, diabetes, cancer.   SOCIAL HISTORY:  Two children.  Ex-smoker. She smoked a pack a day for about  six months and then quit.  Nondrinker.  No drugs.  She is single. She is a  housewife.   DRUG ALLERGIES:  None.   MEDICATIONS:  Tussionex and Lasix.  She has been using over-the-counter  Excedrin, Rolaids,  Tums, and Mylanta.   REVIEW OF SYSTEMS:  Twelve-system review shows the orthopnea, blurred  vision, dizziness; otherwise the review is negative other than what is in  the history of present illness.   PHYSICAL  EXAMINATION:  VITAL SIGNS:  Temperature 98.7, pulse 113,  respirations 25, blood pressure 140/95.  GENERAL:  Well-developed, overweight, African American female in mild  respiratory distress.  HEENT:  Conjunctivae and lids are normal.  Pupils are reactive.  She has  normal hearing, mucosa and pharynx.  NECK:  Shows no jugular venous distension or thyromegaly.  LUNGS:  Respiratory effort is slightly increased.  The lungs have bilateral  crackles.  No overt wheezing.  HEART:  Regular rate and rhythm.  Normal S1 and S2 without murmurs, rubs, or  gallops.  There is no cyanosis or clubbing.  She has 1+ peripheral edema  bilaterally.  BREASTS:  No masses, tenderness or discharge.  ABDOMEN:  Nontender even in the epigastrium.  No hepatosplenomegaly or  hernias.  GU:  Normal external genitalia with Foley in place.  LYMPH NODES:  No cervical adenopathy.  MUSCULOSKELETAL:  Muscle strength is 5/5 with full range of motion in all  four extremities.  SKIN:  No rash, lesions, nodules.  NEUROLOGIC:  Cranial nerves are intact.  DTRs are 1+ and equal.  She has  normal sensation in her feet.  Alert and oriented x3.  Normal memory,  judgment and affect.   LABORATORY DATA:  Troponin of 1.28.  BNP 1000.  Potassium 3.2. Chest x-ray  shows mild interstitial edema.  Hemoglobin 6.9.  Stool is heme-positive.  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   IMPRESSION:  1.  Congestive heart failure.  In retrospect, I think she has been having      episodes of this for several months.  Cardiology consult has been done      by Dr. Alanda Amass and he has started her on Lasix, ACE inhibitor, beta      blocker.  2.  Coronary artery disease.  Rule out acute myocardial infarction.      Concerned that the patient has had a silent inferior wall myocardial      infarction sometime in the past.  We do not know what the status of her      coronary arteries is.  This will be treated per cardiology.  3.  Severe anemia secondary to  blood loss.  Hard to tell how much acute or      chronic or acute on chronic. She will be transfused.  4.  Gastrointestinal bleed.  Gastroenterology will require cardiology      clearance before considering scoping.  In the meantime, the patient will      be on proton pump inhibitor and Carafate and a clear liquid diet.  Will      get a swallowing study and see what that looks like.  If we cannot do      endoscopy, consider doing an upper GI.  5.  Hypertension.  I suspect this is not been adequately controlled.  6.  Dysphagia.  7.  Hypokalemia.  This will be replaced.      Jonna L. Robb Matar, M.D.  Electronically Signed     JLB/MEDQ  D:  10/17/2005  T:  10/17/2005  Job:  [id number]

## 2010-10-08 NOTE — Discharge Summary (Signed)
[name], Colleen NO.:  [id number]   MEDICAL RECORD NO.:  [id number]          PATIENT TYPE:  INP   LOCATION:  2010                         FACILITY:  [facility]   PHYSICIAN:  Isidor Holts, M.D.  DATE OF BIRTH:  1960-08-10   DATE OF ADMISSION:  10/17/2005  DATE OF DISCHARGE:  10/20/2005                                 DISCHARGE SUMMARY   PRIMARY CARE PHYSICIAN:  [name], M.D.   DISCHARGE DIAGNOSES:  1.  Nonischemic cardiomyopathy, ejection fraction approximately 35%.  2.  Congestive heart failure secondary to #1.  3.  Tako-tsubo syndrome.  4.  Profound microcytic anemia, requiring transfusion of 2 units of packed      red blood cells.  5.  Urinary tract infection.  6.  Hypertension.   DISCHARGE MEDICATIONS:  1.  Avapro 150 mg p.o. daily.  2.  Aspirin 81 mg p.o. daily.  3.  Lasix 40 mg p.o. daily.  4.  K-Dur 20 mEq p.o. daily.  5.  Nu-Iron 150 mg p.o. b.i.d.  6.  Protonix 40 mg p.o. daily.  7.  Coreg 6.25 mg p.o. b.i.d.  8.  Aldactone 12.5 mg p.o. daily.  9.  Digoxin 125 mcg p.o. daily.  10. Ciprofloxacin 250 mg p.o. b.i.d. x2 days only.   PROCEDURES:  1.  Chest x-ray dated Sep 29, 2005, showed increasing interstitial      prominence, worsening bronchitis with small interstitial edema including      bibasilar atelectasis and cardiomegaly.  2.  Two-dimensional echocardiogram dated Oct 18, 2005, showing overall      ejection fraction 30-35% with global hypokinesis as well as inferior      akinesis and severe post hypokinesia to akinesia.  Additionally,      anteroseptal wall appears akinetic and apex is not well-visualized.  No      echocardiographic evidence for mitral valve prolapse, moderate to severe      mitral valvular regurgitation.  Mitral valve are a 3.73 sq cm.  Mildly      dilated right ventricle, mild to moderate pulmonic regurgitation.  3.  Coronary angiogram/cardiac catheterization dated Oct 14, 2005, by Dr.      Susa Griffins.  This showed no significant coronary artery disease.      The patient had apical ballooning, antero- apical and distal infero-      apical hypokinesis compatible with tako-tsubo syndrome.   CONSULTATIONS:  1.  [name] Amass, M.D., cardiology.  2.  [name], M.D., gastroenterology.   HISTORY OF PRESENT ILLNESS:  Please refer to H&P note of Oct 17, 2005.  However in brief, this is a 50 year old female, with known history of  hypertension for over 20 years, history of peptic ulcer disease several  years, ago not currently on regular medication, other than over-the-counter  medication.  She presents with a history of two- to three-pillow orthopnea  for approximately 1 year, with recurrent cough that is not responsive to  Tussionex.  Apparently, she was treated for an episode of bronchitis 2  weeks ago with prednisone and Tussionex.  In addition, she complains of  chest fullness and feels that things get stuck when she swallows, although  this is not associated as vomiting.  She feels quite weak, and has been  unable to eat for approximately 3 days prior to presentation.  She was  admitted for further evaluation, investigation and management.   HOSPITAL COURSE:  Problem 1.  NONISCHEMIC CARDIOMYOPATHY/CONGESTIVE HEART  FAILURE:  This patient presents with history of dyspnea, associated with  paroxysmal nocturnal dyspnea, for approximately 1 year with recent worsening  of symptoms.  She has in the recent past, been managed for bronchitis.  Physical examination demonstrated physical features consistent with  congestive heart failure.  Chest x-ray also showed cardiomegaly and  pulmonary vascular congestion.  BNP was 1000.  Cardiology consultation was  called.  The patient was managed with diuretics, ARB and beta-blocker, with  satisfactory clinical response.  EKG showed no acute ischemic changes,  however, the patient did have significantly elevated cardiac enzymes.   She  subsequently underwent cardiac catheterization on Oct 18, 2005.  This  demonstrated angiographically normal coronary arteries.  She did, however,  have apical ballooning, anterior apical and distal inferior apical  hypokinesis, said to be compatible with tako-tsubo syndrome.  Apparently,  this syndrome generally affects young or middle aged women.  It is often  associated with high degree of stress, and may be somewhat catecholamine  dependent..  Medical treatment is directed towards treatment of LV  dysfunction and prognosis is considered generally good.  It is recommended  that the patient undergo cardiology followup and followup outpatient 2-D  echocardiogram, for assessment of LV function.   Problem 2.  PROFOUND MICROCYTIC ANEMIA:  The patient presented with a  hemoglobin of 6.9 with a microcytosis of 65.7, i.e. features consistent with  profound iron deficiency anemia.  She has no history of hematemesis and/or  melena.  She states she has had peptic ulcer disease in the past.  There  also was some questionable history of dysphagia, however, on evaluation in  the hospital she had no symptoms referable to this, during the course of her  hospital stay.  The patient was transfused with 2 units PRBCs resulting in  posttransfusion hemoglobin of 10.3 on Oct 20, 2005.  She has been commenced  on iron supplements.  GI consultation was called, which was kindly provided  by Dr. Russella Hale, who has recommended that given the patient's recent cardiac  problems, endoscopic evaluation be deferred  for 4-6 weeks.  She will follow  up in his office.  Appointment has been scheduled for October 30, 2005.  Meanwhile, the patient continues on iron supplements and proton pump  inhibitor treatment.   Problem 3.  URINARY TRACT INFECTION:  The patient was found to have a  positive urinalysis, consistent with UTI.  She has been treated with a course of ciprofloxacin, which will be completed on October 22, 2005, a  7-day  course.   CONDITION ON DISCHARGE:  Discharged in satisfactory condition on Oct 20, 2005.   ACTIVITY:  She is recommended to return to regular duties on October 27, 2005.  Activity as tolerated.   DIET:  Healthy-heart diet.   FOLLOW UP:  The patient is instructed to follow up with her PCP, [name], M.D., per prior scheduled appointment.  She is to follow up with  Dr. Alanda Amass in cardiology in 1-2 weeks.  Telephone number has  been supplied.  The patient is to call for appointment.  Appointment has  already been scheduled  with Dr. Claudette Head, at 9:30 a.m., October 30, 2005.  All this has been communicated to the patient and she has verbalized  understanding.      Isidor Holts, M.D.  Electronically Signed     CO/MEDQ  D:  10/20/2005  T:  10/20/2005  Job:  [id number]   cc:   [name], M.D.  Fax: [phone]   [name] Amass, M.D.  Fax: [phone]   Venita Lick. Russella Hale, M.D. [facility]  [address]  [place]  [place] [address]

## 2010-10-09 ENCOUNTER — Inpatient Hospital Stay (HOSPITAL_COMMUNITY): Payer: Medicaid Other

## 2010-10-09 LAB — CBC
MCHC: 33.7 g/dL (ref 30.0–36.0)
MCV: 83.9 fL (ref 78.0–100.0)
Platelets: 158 10*3/uL (ref 150–400)
RDW: 15.9 % — ABNORMAL HIGH (ref 11.5–15.5)
WBC: 6.6 10*3/uL (ref 4.0–10.5)

## 2010-10-09 LAB — COMPREHENSIVE METABOLIC PANEL
BUN: 23 mg/dL (ref 6–23)
Calcium: 9.1 mg/dL (ref 8.4–10.5)
Creatinine, Ser: 1.84 mg/dL — ABNORMAL HIGH (ref 0.4–1.2)
Glucose, Bld: 103 mg/dL — ABNORMAL HIGH (ref 70–99)
Sodium: 137 mEq/L (ref 135–145)
Total Protein: 7.7 g/dL (ref 6.0–8.3)

## 2010-10-10 LAB — BASIC METABOLIC PANEL
BUN: 25 mg/dL — ABNORMAL HIGH (ref 6–23)
CO2: 26 mEq/L (ref 19–32)
Chloride: 99 mEq/L (ref 96–112)
Glucose, Bld: 90 mg/dL (ref 70–99)
Potassium: 4.1 mEq/L (ref 3.5–5.1)

## 2010-10-10 LAB — CBC
HCT: 38.6 % (ref 36.0–46.0)
Hemoglobin: 12.7 g/dL (ref 12.0–15.0)
MCV: 84.5 fL (ref 78.0–100.0)
RBC: 4.57 MIL/uL (ref 3.87–5.11)
WBC: 6.2 10*3/uL (ref 4.0–10.5)

## 2010-10-29 DIAGNOSIS — Z0271 Encounter for disability determination: Secondary | ICD-10-CM

## 2010-10-30 NOTE — Discharge Summary (Signed)
[name], [name] NO.:  [id number]  MEDICAL RECORD NO.:  [id number]           PATIENT TYPE:  I  LOCATION:  4706                         FACILITY:  [facility]  PHYSICIAN:  Landry Corporal, MD DATE OF BIRTH:  [birth year]  DATE OF ADMISSION:  10/06/2010 DATE OF DISCHARGE:  10/10/2010                              DISCHARGE SUMMARY   DISCHARGE DIAGNOSES: 1. Acute congestive heart failure secondary to pulmonary hypertension and mild LV     dysfunction with EF 40-45%. 2. Hypothyroidism with significant TSH of up to 227.     a.     The patient admits to not taking Synthroid for 1 week. 3. Pulmonary hypertension. 4. Status post mitral and aortic valve repairs. 5. LV dysfunction. 6. Borderline blood pressure secondary to medication. 7. Renal insufficiency secondary to diuresing. 8. Hypokalemia, resolved. 9. Nonsustained ventricular tachycardia up to 3 beats. 10.History of thyroid cancer, status post thyroidectomy.  DISCHARGE MEDICATIONS:  See medication reconciliation sheet.  DISCHARGE INSTRUCTIONS: 1. Activity as tolerated. 2. Low-sodium, heart-healthy diet. 3. Follow up with Dr. Herbie Baltimore.  The office will call with date and     time. 4. Follow up with Dr. Talmage Nap which is very important.  Our office is     going to attempt to arrange appointment for the next day after     discharge.  HISTORY OF PRESENT ILLNESS:  A 50 year old African American female, presented to the emergency room Oct 06, 2010, secondary to dyspnea on exertion and truncal edema, increased weakness and some chest discomfort.  She also felt like she had a fluttering in her chest.  The patient had chest discomfort for 2-3 days.  The patient noted chest discomfort rated 2-3 over 1-10 scale.  No associated diaphoresis or syncope.  She also complained of increased truncal edema.  She has been unable to obtain her medicines due to financial issues, but she has maintained her diuretic and she had  been compliant with low-sodium diet. Her last heart cath was in July 2010 without coronary artery disease. The patient was admitted for diuresing.  She also has a history of pulmonary hypertension, obesity, status post mitral and aortic valve repairs, hypertension, obesity, history of thyroid cancer with thyroidectomy.  By the next morning, lung with decreased breath sounds in the bases, but overall breathing better.  Her 2-D echo was pending and she was given Bumex 2 mg IV b.i.d.  By May 18, she was seen at 4:16. TSH came back 189, potassium was low and this was replaced.  Two-D echo was completed.  By the 19th, she had mild LV dysfunction on her echo. TSH was 227.  Creatinine had increased to 1.84.  Blood pressure was low at 98/56.  Weight was down from 84 down to 82 kg.  By May 20, she was feeling much better.  Blood pressure was improved to 120/80.  Lungs were clear.  Heart was regular rate and rhythm with soft outflow murmur, and Dr. Allyson Sabal felt she was stable for discharge home.  She will follow up with Dr. Talmage Nap for thyroid and with Dr. Herbie Baltimore.  LABORATORY DATA:  Hemoglobin 12.7, MAC  of 38.6, WBC 6.2, platelets 190, neutrophils 68, lymphs 26, monos 4, eos 1, basos 0.  Pro-time 13.2, INR 0.98.  Chemistry:  Sodium 136, potassium 4.1, chloride 99, CO2 of 26, glucose 90, BUN 25, creatinine 1.57 which was down from the peak of 1.84.  Total protein was 7.7, albumin 3.8, AST 18, ALT 6, alkaline phos 72, total bili 0.4, magnesium 2.5, and phosphorus 2.8.  Glycohemoglobin was 5.8.  Cardiac enzymes:  CK 197 with negative MB of 3.3 and troponin I of less than 0.30.  BNP was 5598.  Followup pro-BNP came down to 369.  Followup CK 182, MB 3.1, troponin I less than 0.30.  Total cholesterol 275, triglycerides 114, HDL 49, and LDL 203.  T4 was 3.4, TSH initially was 189 and followup 227, free T4 was 0.44, T3 was 13.5 and free T3 was 0.4.  UA was clear.  Chest x-ray:  Mild bibasilar atelectasis  scarring.  No acute abnormality.  No significant change on followup.  Two-D echo, EF was 40-45%, aortic valve status post annuloplasty, tiny mobile structure may be attached to the right coronary cusp, mitral valve repair include surgical repair.  EKGs sinus rhythm, rightward axis, T-waves in II, III and aVF, occasional PVC, also T-wave inversions in V5 and V6,  similar to old EKG.     Darcella Gasman. Annie Paras, N.P.   ______________________________ Landry Corporal, MD    LRI/MEDQ  D:  10/20/2010  T:  10/21/2010  Job:  [id number]  cc:   Dorisann Frames, M.D.  Electronically Signed by Nada Boozer N.P. on 10/30/2010 03:15:35 PM Electronically Signed by Bryan Lemma MD on 10/30/2010 06:16:38 PM

## 2010-11-22 ENCOUNTER — Encounter (INDEPENDENT_AMBULATORY_CARE_PROVIDER_SITE_OTHER): Payer: Self-pay | Admitting: Surgery

## 2010-12-16 ENCOUNTER — Ambulatory Visit: Payer: Medicaid Other | Attending: Orthopedic Surgery | Admitting: Occupational Therapy

## 2010-12-16 DIAGNOSIS — M6281 Muscle weakness (generalized): Secondary | ICD-10-CM | POA: Insufficient documentation

## 2010-12-16 DIAGNOSIS — G5600 Carpal tunnel syndrome, unspecified upper limb: Secondary | ICD-10-CM | POA: Insufficient documentation

## 2010-12-16 DIAGNOSIS — M2550 Pain in unspecified joint: Secondary | ICD-10-CM | POA: Insufficient documentation

## 2010-12-16 DIAGNOSIS — IMO0001 Reserved for inherently not codable concepts without codable children: Secondary | ICD-10-CM | POA: Insufficient documentation

## 2010-12-21 ENCOUNTER — Ambulatory Visit: Payer: Medicaid Other | Admitting: Occupational Therapy

## 2011-02-11 LAB — CBC
HCT: 32.5 — ABNORMAL LOW
HCT: 33.5 — ABNORMAL LOW
HCT: 33.9 — ABNORMAL LOW
Hemoglobin: 10.2 — ABNORMAL LOW
MCHC: 30.9
MCV: 70.7 — ABNORMAL LOW
MCV: 71.1 — ABNORMAL LOW
MCV: 71.2 — ABNORMAL LOW
Platelets: 221
Platelets: 234
RBC: 4.74
RDW: 22.6 — ABNORMAL HIGH
WBC: 4.8
WBC: 5.7
WBC: 6.1

## 2011-02-11 LAB — CARDIAC PANEL(CRET KIN+CKTOT+MB+TROPI)
CK, MB: 1.9
Total CK: 88
Troponin I: 0.02

## 2011-02-11 LAB — POCT CARDIAC MARKERS: Troponin i, poc: <0.05

## 2011-02-11 LAB — URINE CULTURE: Colony Count: >=100000

## 2011-02-11 LAB — DIFFERENTIAL
Basophils Absolute: 0
Basophils Absolute: 0.1
Basophils Relative: 0
Lymphs Abs: 1.3
Lymphs Abs: 1.6
Monocytes Relative: 11
Monocytes Relative: 9
Neutro Abs: 3
Neutro Abs: 3.3

## 2011-02-11 LAB — BASIC METABOLIC PANEL
BUN: 8
BUN: 8
CO2: 24
Calcium: 8.5
Calcium: 8.7
Chloride: 106
Chloride: 95 — ABNORMAL LOW
Creatinine, Ser: 0.98
GFR calc Af Amer: 52 — ABNORMAL LOW
GFR calc Af Amer: 54 — ABNORMAL LOW
GFR calc Af Amer: 59 — ABNORMAL LOW
GFR calc non Af Amer: 44 — ABNORMAL LOW
GFR calc non Af Amer: 49 — ABNORMAL LOW
GFR calc non Af Amer: >60
Glucose, Bld: 118 — ABNORMAL HIGH
Glucose, Bld: 90
Potassium: 3.4 — ABNORMAL LOW
Potassium: 4.2
Potassium: 4.5
Potassium: 4.5
Sodium: 135
Sodium: 135

## 2011-02-11 LAB — CULTURE, BLOOD (ROUTINE X 2): Culture: NO GROWTH

## 2011-02-11 LAB — LIPID PANEL
Cholesterol: 112
HDL: 24 — ABNORMAL LOW
LDL Cholesterol: 76
Triglycerides: 60

## 2011-02-11 LAB — B-NATRIURETIC PEPTIDE (CONVERTED LAB): Pro B Natriuretic peptide (BNP): 458 — ABNORMAL HIGH

## 2011-02-11 LAB — URINE MICROSCOPIC-ADD ON

## 2011-02-11 LAB — URINALYSIS, ROUTINE W REFLEX MICROSCOPIC
Nitrite: NEGATIVE
Specific Gravity, Urine: 1.011
Urobilinogen, UA: 4 — ABNORMAL HIGH

## 2011-02-11 LAB — CK TOTAL AND CKMB (NOT AT ARMC): CK, MB: 1.9

## 2011-02-11 LAB — DIGOXIN LEVEL: Digoxin Level: <0.2 — ABNORMAL LOW

## 2011-02-11 LAB — TROPONIN I: Troponin I: 0.04

## 2011-02-14 LAB — COMPREHENSIVE METABOLIC PANEL
ALT: 22
AST: 26
Albumin: 3.6
Alkaline Phosphatase: 85
Calcium: 9.1
GFR calc Af Amer: >60
Potassium: 3.4 — ABNORMAL LOW
Sodium: 140
Total Protein: 7.5

## 2011-02-14 LAB — BASIC METABOLIC PANEL
BUN: 24 — ABNORMAL HIGH
CO2: 29
Chloride: 96
Creatinine, Ser: 1.11

## 2011-02-14 LAB — DIFFERENTIAL
Eosinophils Relative: 0
Lymphs Abs: 0.9
Monocytes Absolute: 0.2
Neutro Abs: 5.2

## 2011-02-14 LAB — CBC
HCT: 35.2 — ABNORMAL LOW
Hemoglobin: 10.7 — ABNORMAL LOW
MCHC: 31.2
MCHC: 32.1
MCV: 70.7 — ABNORMAL LOW
MCV: 75.3 — ABNORMAL LOW
Platelets: 283
Platelets: 293
Platelets: 150
RBC: 5.15 — ABNORMAL HIGH
RDW: 23 — ABNORMAL HIGH

## 2011-02-14 LAB — GC/CHLAMYDIA PROBE AMP, GENITAL
Chlamydia, DNA Probe: NEGATIVE
GC Probe Amp, Genital: NEGATIVE

## 2011-02-14 LAB — WET PREP, GENITAL

## 2011-02-14 LAB — B-NATRIURETIC PEPTIDE (CONVERTED LAB): Pro B Natriuretic peptide (BNP): 429 — ABNORMAL HIGH

## 2011-02-16 LAB — CBC
Platelets: 248
RDW: 22.6 — ABNORMAL HIGH
WBC: 7.6

## 2011-02-16 LAB — BASIC METABOLIC PANEL
BUN: 10
Chloride: 105
Creatinine, Ser: 0.68
GFR calc Af Amer: >60
GFR calc non Af Amer: >60
Potassium: 3.8

## 2011-02-18 LAB — POCT I-STAT, CHEM 8
BUN: 8
Chloride: 109
Creatinine, Ser: 0.9
Glucose, Bld: 106 — ABNORMAL HIGH
Hemoglobin: 9.2 — ABNORMAL LOW
Potassium: 3.1 — ABNORMAL LOW
Sodium: 142

## 2011-02-18 LAB — DIGOXIN LEVEL: Digoxin Level: <0.2 — ABNORMAL LOW

## 2011-02-18 LAB — POCT CARDIAC MARKERS
CKMB, poc: <1 — ABNORMAL LOW
Myoglobin, poc: 42.4
Troponin i, poc: <0.05

## 2011-02-18 LAB — DIFFERENTIAL
Basophils Absolute: 0
Eosinophils Relative: 1
Lymphocytes Relative: 27
Lymphs Abs: 1.5
Neutro Abs: 3.6

## 2011-02-18 LAB — CBC
HCT: 25.2 — ABNORMAL LOW
Hemoglobin: 8.2 — ABNORMAL LOW
Platelets: 202
RDW: 18.1 — ABNORMAL HIGH
WBC: 5.5

## 2011-02-18 LAB — RAPID URINE DRUG SCREEN, HOSP PERFORMED
Amphetamines: NOT DETECTED
Barbiturates: NOT DETECTED
Opiates: NOT DETECTED

## 2011-02-18 LAB — POCT PREGNANCY, URINE: Operator id: 277751

## 2011-02-18 LAB — ETHANOL: Alcohol, Ethyl (B): <5

## 2011-03-03 LAB — BASIC METABOLIC PANEL
CO2: 28
Calcium: 9.2
Chloride: 99
GFR calc Af Amer: 52 — ABNORMAL LOW
GFR calc Af Amer: 56 — ABNORMAL LOW
GFR calc non Af Amer: 43 — ABNORMAL LOW
Glucose, Bld: 149 — ABNORMAL HIGH
Potassium: 3.6
Potassium: 4.1
Sodium: 136
Sodium: 137

## 2011-03-03 LAB — CBC
Hemoglobin: 8.6 — ABNORMAL LOW
MCHC: 31.9
MCV: 72.8 — ABNORMAL LOW
RBC: 3.7 — ABNORMAL LOW
RBC: 3.97
WBC: 6.1

## 2011-03-03 LAB — CARDIAC PANEL(CRET KIN+CKTOT+MB+TROPI)
CK, MB: 1.8
Total CK: 66
Troponin I: 0.09 — ABNORMAL HIGH

## 2011-03-04 LAB — BASIC METABOLIC PANEL
Chloride: 106
GFR calc Af Amer: >60
GFR calc non Af Amer: 51 — ABNORMAL LOW
Potassium: 3.6
Sodium: 140

## 2011-03-04 LAB — CARDIAC PANEL(CRET KIN+CKTOT+MB+TROPI)
CK, MB: 2.6
Relative Index: INVALID
Relative Index: INVALID
Relative Index: INVALID
Total CK: 78
Troponin I: 0.1 — ABNORMAL HIGH
Troponin I: 0.12 — ABNORMAL HIGH

## 2011-03-04 LAB — I-STAT 8, (EC8 V) (CONVERTED LAB)
Acid-base deficit: 4 — ABNORMAL HIGH
Bicarbonate: 22.8
Potassium: 3.4 — ABNORMAL LOW
TCO2: 24
pCO2, Ven: 45.3
pH, Ven: 7.31 — ABNORMAL HIGH

## 2011-03-04 LAB — DIFFERENTIAL
Eosinophils Relative: 0
Lymphs Abs: 2.3
Monocytes Absolute: 0.4

## 2011-03-04 LAB — HEMOGLOBINOPATHY EVALUATION
Hemoglobin Other: 0 (ref 0.0–0.0)
Hgb A: 98.1 % — ABNORMAL HIGH
Hgb F Quant: 0 (ref 0.0–2.0)
Hgb S Quant: 0 % (ref 0.0–0.0)

## 2011-03-04 LAB — CBC
HCT: 30.6 — ABNORMAL LOW
MCV: 73.2 — ABNORMAL LOW
Platelets: 222
Platelets: 258
RDW: 24.1 — ABNORMAL HIGH
WBC: 6.1
WBC: 7.6

## 2011-03-04 LAB — POCT CARDIAC MARKERS
CKMB, poc: 1.9
Myoglobin, poc: 72.9
Troponin i, poc: 0.07 — ABNORMAL HIGH

## 2011-03-04 LAB — POCT I-STAT CREATININE
Creatinine, Ser: 1
Operator id: 265201

## 2011-03-04 LAB — TSH: TSH: 3.666

## 2011-03-04 LAB — TROPONIN I: Troponin I: 0.11 — ABNORMAL HIGH

## 2011-03-10 LAB — FERRITIN: Ferritin: 13 (ref 10–291)

## 2011-03-10 LAB — CARDIAC PANEL(CRET KIN+CKTOT+MB+TROPI)
Relative Index: INVALID
Total CK: 48
Troponin I: 0.06

## 2011-03-10 LAB — DIFFERENTIAL
Basophils Absolute: 0.1
Basophils Relative: 1
Eosinophils Relative: 2
Lymphocytes Relative: 25
Lymphs Abs: 2.4
Monocytes Relative: 8
Neutro Abs: 6.1

## 2011-03-10 LAB — CROSSMATCH
ABO/RH(D): O NEG
Antibody Screen: NEGATIVE

## 2011-03-10 LAB — CBC
HCT: 26.7 — ABNORMAL LOW
HCT: 32.3 — ABNORMAL LOW
HCT: 32.6 — ABNORMAL LOW
Hemoglobin: 7.9 — CL
MCHC: 30
MCHC: 30.7
MCV: 62.6 — ABNORMAL LOW
MCV: 67.6 — ABNORMAL LOW
MCV: 67.9 — ABNORMAL LOW
Platelets: 228
Platelets: 348
Platelets: 388
RBC: 3.79 — ABNORMAL LOW
RBC: 4.75
RBC: 4.83
RDW: 22.4 — ABNORMAL HIGH
WBC: 8.9
WBC: 9.6

## 2011-03-10 LAB — B-NATRIURETIC PEPTIDE (CONVERTED LAB): Pro B Natriuretic peptide (BNP): 1061 — ABNORMAL HIGH

## 2011-03-10 LAB — PREGNANCY, URINE: Preg Test, Ur: NEGATIVE

## 2011-03-10 LAB — I-STAT 8, (EC8 V) (CONVERTED LAB)
Acid-base deficit: 5 — ABNORMAL HIGH
TCO2: 22
pCO2, Ven: 37.4 — ABNORMAL LOW
pH, Ven: 7.35 — ABNORMAL HIGH

## 2011-03-10 LAB — BASIC METABOLIC PANEL
BUN: 11
CO2: 25
CO2: 26
Calcium: 8.4
Chloride: 104
Creatinine, Ser: 1.27 — ABNORMAL HIGH
GFR calc Af Amer: >60
GFR calc non Af Amer: 58 — ABNORMAL LOW
Potassium: 3.8
Sodium: 137

## 2011-03-10 LAB — RETICULOCYTES: Retic Count, Absolute: 126.5

## 2011-03-10 LAB — POCT I-STAT CREATININE
Creatinine, Ser: 1.1
Operator id: 257131

## 2011-03-10 LAB — OCCULT BLOOD X 1 CARD TO LAB, STOOL: Fecal Occult Bld: NEGATIVE

## 2011-03-10 LAB — CK TOTAL AND CKMB (NOT AT ARMC): Relative Index: INVALID

## 2011-03-10 LAB — IRON AND TIBC
Iron: 14 — ABNORMAL LOW
TIBC: 297

## 2011-11-03 ENCOUNTER — Inpatient Hospital Stay (HOSPITAL_COMMUNITY): Payer: Medicaid Other

## 2011-11-03 ENCOUNTER — Encounter (HOSPITAL_COMMUNITY): Payer: Self-pay | Admitting: *Deleted

## 2011-11-03 ENCOUNTER — Inpatient Hospital Stay (HOSPITAL_COMMUNITY)
Admission: AD | Admit: 2011-11-03 | Discharge: 2011-11-03 | Disposition: A | Discharge status: Home / Self Care | Payer: Medicaid Other | Source: Ambulatory Visit | Attending: Obstetrics & Gynecology | Admitting: Obstetrics & Gynecology

## 2011-11-03 DIAGNOSIS — N950 Postmenopausal bleeding: Secondary | ICD-10-CM | POA: Insufficient documentation

## 2011-11-03 DIAGNOSIS — D259 Leiomyoma of uterus, unspecified: Secondary | ICD-10-CM | POA: Insufficient documentation

## 2011-11-03 LAB — CBC
HCT: 36.6 % (ref 36.0–46.0)
MCHC: 33.1 g/dL (ref 30.0–36.0)
MCV: 83.9 fL (ref 78.0–100.0)
Platelets: 228 10*3/uL (ref 150–400)
RDW: 14.4 % (ref 11.5–15.5)
WBC: 11.9 10*3/uL — ABNORMAL HIGH (ref 4.0–10.5)

## 2011-11-03 MED ORDER — IBUPROFEN 600 MG PO TABS: 600.0000 mg | ORAL_TABLET | Freq: Four times a day (QID) | ORAL | Status: AC | PRN

## 2011-11-03 MED ORDER — MEDROXYPROGESTERONE ACETATE 10 MG PO TABS: 20.0000 mg | ORAL_TABLET | Freq: Every day | ORAL | Status: DC

## 2011-11-03 NOTE — MAU Note (Signed)
C/o moderate bleeding since Sunday along with N&V; hx of fibroids;

## 2011-11-03 NOTE — MAU Note (Signed)
Pt states bleeding since Sunday, no menstrual cycle in 2 years, began light then became very heavy, changing pad 5-7 times daily. Has changed pad x3 this am. Unsure if she is in menopause. Has felt "terrible" since bleeding began.

## 2011-11-03 NOTE — MAU Provider Note (Signed)
Colleen L Crowell51 [id number]  Chief Complaint  Patient presents with  . Vaginal Bleeding    Provider at bedside 1132    SUBJECTIVE  HPI: Amenorrheic x2 years consistent with menopause. Began having heavy vaginal bleeding -- "blood pouring out"--5 days ago. Associated with lower abd cramping pain. Feels woozy and weak when standing. History of uterine fibroids, CHF, pulmonary HTN. PMD [facility]. Last Pap many years ago.   Past Medical History  Diagnosis Date  . Heart disease   . Cancer of thyroid   . Fibroid uterus    Past Surgical History  Procedure Date  . Cardiac surgery   . Thyroid surgery   . Cesarean section    History   Social History  . Marital Status: Single    Spouse Name: N/A    Number of Children: N/A  . Years of Education: N/A   Occupational History  . Not on file.   Social History Main Topics  . Smoking status: Former Games developer  . Smokeless tobacco: Not on file  . Alcohol Use: No  . Drug Use: No  . Sexually Active:    Other Topics Concern  . Not on file   Social History Narrative  . No narrative on file   No current facility-administered medications on file prior to encounter.   Current Outpatient Prescriptions on File Prior to Encounter  Medication Sig Dispense Refill  . albuterol (PROVENTIL HFA;VENTOLIN HFA) 108 (90 BASE) MCG/ACT inhaler Inhale 2 puffs into the lungs every 6 (six) hours as needed. Takes for shortness of breath      . beclomethasone (QVAR) 40 MCG/ACT inhaler Inhale 2 puffs into the lungs 2 (two) times daily.      . carvedilol (COREG) 12.5 MG tablet Take 12.5 mg by mouth 2 (two) times daily.      . citalopram (CELEXA) 20 MG tablet Take 20 mg by mouth daily.      . furosemide (LASIX) 40 MG tablet Take 40 mg by mouth 2 (two) times daily.      [name] levothyroxine (SYNTHROID, LEVOTHROID) 75 MCG tablet Take 75 mcg by mouth daily.      [name] lisinopril (PRINIVIL,ZESTRIL) 10 MG tablet Take 10 mg by mouth daily.       Allergies not on  file  ROS: Pertinent items in HPI  OBJECTIVE Blood pressure 97/47, pulse 60, temperature 97.8 F (36.6 C), temperature source Oral, resp. rate 16, height 4\' 9"  (1.448 m), weight 81.704 kg (180 lb 2 oz), last menstrual period 10/30/2011, SpO2 100.00%.  GENERAL: Obese AA female in no acute distress.  HEENT: Normocephalic, good dentition HEART: normal rate RESP: normal effort ABDOMEN: Soft, nontender EXTREMITIES: Nontender, no edema NEURO: Alert and oriented SPECULUM EXAM: NEFG, moderate amount dark red blood noted, cervix clean BIMANUAL: cervix mobile, no CMT; uterus and adnexae: not able to outline due to body habitus  LAB RESULTS   Results for orders placed during the hospital encounter of 11/03/11 (from the past 24 hour(s))  CBC     Status: Abnormal   Collection Time   11/03/11 11:29 AM      Component Value Range   WBC 11.9 (*) 4.0 - 10.5 K/uL   RBC 4.36  3.87 - 5.11 MIL/uL   Hemoglobin 12.1  12.0 - 15.0 g/dL   HCT 43.3  29.5 - 18.8 %   MCV 83.9  78.0 - 100.0 fL   MCH 27.8  26.0 - 34.0 pg   MCHC 33.1  30.0 - 36.0 g/dL  RDW 14.4  11.5 - 15.5 %   Platelets 228  150 - 400 K/uL   IMAGING   ASSESSMENT Postmenopausal bleeding, hemodynamically stable Fibroid uterus Multiple medical problems including significant heart disease  PLAN  Discussed with Dr. Debroah Loop Discharge on Provera 20 mg by mouth daily and followup in GYN Clinic Ibuprofen for pain    Susan Bleich 11/03/2011 11:25 AM

## 2011-11-03 NOTE — MAU Note (Signed)
Was suppose to be in court today but she left court due to feeling ill and dizziness;

## 2011-11-03 NOTE — Discharge Instructions (Signed)
Fibroids Fibroids are lumps (tumors) that can occur any place in a woman's body. These lumps are not cancerous. Fibroids vary in size, weight, and where they grow. HOME CARE  Do not take aspirin.   Write down the number of pads or tampons you use during your period. Tell your doctor. This can help determine the best treatment for you.  GET HELP RIGHT AWAY IF:  You have pain in your lower belly (abdomen) that is not helped with medicine.   You have cramps that are not helped with medicine.   You have more bleeding between or during your period.   You feel lightheaded or pass out (faint).   Your lower belly pain gets worse.  MAKE SURE YOU:  Understand these instructions.   Will watch your condition.   Will get help right away if you are not doing well or get worse.  Document Released: 06/11/2010 Document Revised: 04/28/2011 Document Reviewed: 06/11/2010 [employer] Patient Information 2012 [employer]. 

## 2011-12-05 ENCOUNTER — Ambulatory Visit (INDEPENDENT_AMBULATORY_CARE_PROVIDER_SITE_OTHER): Payer: Medicaid Other | Admitting: Obstetrics & Gynecology

## 2011-12-05 ENCOUNTER — Encounter: Payer: Self-pay | Admitting: Obstetrics & Gynecology

## 2011-12-05 ENCOUNTER — Other Ambulatory Visit (HOSPITAL_COMMUNITY)
Admission: RE | Admit: 2011-12-05 | Discharge: 2011-12-05 | Disposition: A | Discharge status: Home / Self Care | Payer: Medicaid Other | Source: Ambulatory Visit | Attending: Obstetrics & Gynecology | Admitting: Obstetrics & Gynecology

## 2011-12-05 VITALS — BP 124/88 | HR 65 | Temp 97.0°F | Ht <= 58 in | Wt 180.7 lb

## 2011-12-05 DIAGNOSIS — N950 Postmenopausal bleeding: Secondary | ICD-10-CM

## 2011-12-05 DIAGNOSIS — Z01812 Encounter for preprocedural laboratory examination: Secondary | ICD-10-CM

## 2011-12-05 DIAGNOSIS — D259 Leiomyoma of uterus, unspecified: Secondary | ICD-10-CM

## 2011-12-05 DIAGNOSIS — D219 Benign neoplasm of connective and other soft tissue, unspecified: Secondary | ICD-10-CM

## 2011-12-05 HISTORY — DX: Postmenopausal bleeding: N95.0

## 2011-12-05 MED ORDER — MEDROXYPROGESTERONE ACETATE 10 MG PO TABS: 40.0000 mg | ORAL_TABLET | Freq: Every day | ORAL | Status: DC

## 2011-12-05 NOTE — Patient Instructions (Addendum)
Endometrial Biopsy This is a test in which a tissue sample (a biopsy) is taken from inside the uterus (womb). It is then looked at by a specialist under a microscope to see if the tissue is normal or abnormal. The endometrium is the lining of the uterus. This test helps determine where you are in your menstrual cycle and how hormone levels are affecting the lining of the uterus. Another use for this test is to diagnose endometrial cancer, tuberculosis, polyps, or inflammatory conditions and to evaluate uterine bleeding. PREPARATION FOR TEST No preparation or fasting is necessary. NORMAL FINDINGS No pathologic conditions. Presence of "secretory-type" endometrium 3 to 5 days before to normal menstruation. Ranges for normal findings may vary among different laboratories and hospitals. You should always check with your doctor after having lab work or other tests done to discuss the meaning of your test results and whether your values are considered within normal limits. MEANING OF TEST  Your caregiver will go over the test results with you and discuss the importance and meaning of your results, as well as treatment options and the need for additional tests if necessary. OBTAINING THE TEST RESULTS It is your responsibility to obtain your test results. Ask the lab or department performing the test when and how you will get your results. Document Released: 09/09/2004 Document Revised: 04/28/2011 Document Reviewed: 04/18/2008 Exit[employer] Patient Information 2012 [employer]. 

## 2011-12-05 NOTE — Progress Notes (Signed)
  Pt is a 51yo [id number] with LMP ~ 2years previously who c/o bleeding x 1 month.  Pt has been on Provera with no relief of the bleeding.  Here for an endobx.  Pt hodling ear and c/o right ear pain.o  O: Pt in NAD EGBUS: nl; no lesions noted; Vagina: old blood in vagina. Cervix: nonparous Uterus: small  ~8-10 weeks sized. nontender Procedure Cervix cleaned with betadine.  Pipelle sounded to 11cm.  3 passes done of the endometrium with mod amt of dark red blood noted.  Small amt of tissue.  No complications.  HEENT: Ears B- no erythema, no mucopurulent drainage.  No s/sx of infxn   A/ PMPB not resolved on Provera 20mg  daily Right ear pain  P/ F/u 2 weeks for results Provera 40mg s q day Sudafed prn for drainage of R ear.  HARRAWAY-SMITH, Mihir Flanigan

## 2011-12-06 ENCOUNTER — Encounter: Payer: Self-pay | Admitting: *Deleted

## 2011-12-21 ENCOUNTER — Encounter: Payer: Self-pay | Admitting: Family

## 2011-12-21 ENCOUNTER — Ambulatory Visit (INDEPENDENT_AMBULATORY_CARE_PROVIDER_SITE_OTHER): Payer: Medicaid Other | Admitting: Obstetrics & Gynecology

## 2011-12-21 VITALS — BP 120/70 | HR 68 | Temp 97.2°F | Resp 12 | Ht 60.0 in | Wt 183.0 lb

## 2011-12-21 DIAGNOSIS — D259 Leiomyoma of uterus, unspecified: Secondary | ICD-10-CM

## 2011-12-21 DIAGNOSIS — N950 Postmenopausal bleeding: Secondary | ICD-10-CM

## 2011-12-21 DIAGNOSIS — D219 Benign neoplasm of connective and other soft tissue, unspecified: Secondary | ICD-10-CM

## 2011-12-21 MED ORDER — MEGESTROL ACETATE 20 MG PO TABS: ORAL_TABLET | ORAL | Status: AC

## 2011-12-21 NOTE — Progress Notes (Signed)
  Subjective:    Patient ID: [name], female    DOB: 16-Feb-1961, 51 y.o.   MRN: [id number]  HPI Pt is here for results.  Previously seen for vaginal bleeding after 2 years cessation of cycles.  Endometrial biopsy completed - results negative.  Patient was placed on Provera 40 mg to control bleeding. She reports having continued bleeding, wants to discuss further management. No other concerns.    The following portions of the patient's history were reviewed and updated as appropriate: allergies, current medications, past family history, past medical history, past social history, past surgical history and problem list.  12/05/11 Endometrium, biopsy - INACTIVE ENDOMETRIUM, BENIGN SQUAMOUS FRAGMENTS AND BENIGN ENDOCERVIX. NO HYPERPLASIA OR MALIGNANCY IDENTIFIED.  Review of Systems As per HPI     Objective:   Physical Exam Deferred Blood pressure 120/70, pulse 68, temperature 97.2 F (36.2 C), temperature source Oral, resp. rate 12, height 5' (1.524 m), weight 183 lb (83.008 kg), last menstrual period 12/03/2011.    Assessment & Plan:  Discussed further management options for abnormal uterine bleeding including oral egace, Depo Provera, Mirena IUD, endometrial ablation (Novasure/Hydrothermal Ablation/Thermachoice) or hysterectomy as definitive surgical management.  Discussed risks and benefits of each method.  Patient desires Mirena IUD, will have Megace in the interim.  Printed patient education handouts were given to the patient to review at home.  Bleeding precautions reviewed.  She will return in next few days for Mirena IUD placement.

## 2011-12-21 NOTE — Patient Instructions (Addendum)
Intrauterine Device Insertion  Most often, an intrauterine device (IUD) is inserted into the uterus to prevent pregnancy or help with abnormal blood loss.   Hormone IUD. This type of IUD contains the hormone progestin (synthetic progesterone). The progestin thickens the cervical mucus and prevents sperm from entering the uterus, and it also thins the uterine lining. There is no evidence that the hormone IUD prevents implantation. The hormone IUD can stay in place for up to 5 years.  An IUD is the most cost-effective birth control if left in place for the full duration. It may be removed at any time. LET YOUR CAREGIVER KNOW ABOUT:  Sensitivity to metals.   Medicines taken including herbs, eyedrops, over-the-counter medicines, and creams.   Use of steroids (by mouth or creams).   Previous problems with anesthetics or numbing medicine.   Previous gynecological surgery.   History of blood clots or clotting disorders.   Possibility of pregnancy.   Menstrual irregularities.   Concerns regarding unusual vaginal discharge or odors.   Previous experience with an IUD.   Other health problems.  RISKS AND COMPLICATIONS  Accidental puncture (perforation) of the uterus.   Accidental placement of the IUD either in the muscle layer of the uterus (myometrium) or outside the uterus. If this happen, the IUD can be found essentially floating around the bowels. When this happens, the IUD must be taken out surgically.   The IUD may fall out of the uterus (expulsion). This is more common in women who have recently had a child.    Pregnancy in the fallopian tube (ectopic).  PROCEDURE   A tool (speculum) is placed in the vagina. This allows your caregiver to see the lower part of the uterus (cervix).   The cervix is prepped with a medicine that lowers the risk of infection.   You may be given a medicine to numb each side of the cervix (intracervical or paracervical block). This is used to block  and control any discomfort with insertion.   A tool (uterine sound) is inserted into the uterus to determine the length of the uterine cavity and the direction the uterus may be tilted.   A slim instrument (IUD inserter) is inserted through the cervical canal and into your uterus.   The IUD is placed in the uterine cavity and the insertion device is removed.   The nylon string that is attached to the IUD, and used for eventual IUD removal, is trimmed. It is trimmed so that it lays high in the vagina, just outside the cervix.  AFTER THE PROCEDURE  You may have bleeding after the procedure. This is normal. It varies from light spotting for a few days to menstrual-like bleeding.   You may have mild cramping.   Practice checking the string coming out of the cervix to make sure the IUD remains in the uterus. If you cannot feel the string, you should schedule a "string check" with your caregiver.   If you had a hormone IUD inserted, expect that your period may be lighter or nonexistent within a year's time (though this is not always the case). There may be delayed fertility with the hormone IUD as a result of its progesterone effect. When you are ready to become pregnant, it is suggested to have the IUD removed up to 1 year in advance.   Yearly exams are advised.  Document Released: 01/05/2011 Document Revised: 04/28/2011 Document Reviewed: 01/05/2011 [facility] Patient Information 2012 [place], [place].

## 2012-01-16 ENCOUNTER — Ambulatory Visit: Payer: Medicaid Other | Admitting: Obstetrics & Gynecology

## 2012-02-06 DIAGNOSIS — I10 Essential (primary) hypertension: Secondary | ICD-10-CM | POA: Insufficient documentation

## 2012-02-06 DIAGNOSIS — Z952 Presence of prosthetic heart valve: Secondary | ICD-10-CM | POA: Insufficient documentation

## 2012-02-13 DIAGNOSIS — F32A Depression, unspecified: Secondary | ICD-10-CM | POA: Insufficient documentation

## 2012-02-28 DIAGNOSIS — I509 Heart failure, unspecified: Secondary | ICD-10-CM | POA: Insufficient documentation

## 2012-02-28 DIAGNOSIS — I429 Cardiomyopathy, unspecified: Secondary | ICD-10-CM | POA: Insufficient documentation

## 2012-03-06 ENCOUNTER — Emergency Department (HOSPITAL_COMMUNITY)
Admission: EM | Admit: 2012-03-06 | Discharge: 2012-03-06 | Disposition: A | Discharge status: Home / Self Care | Payer: Medicaid Other | Attending: Emergency Medicine | Admitting: Emergency Medicine

## 2012-03-06 ENCOUNTER — Emergency Department (HOSPITAL_COMMUNITY): Payer: Medicaid Other

## 2012-03-06 ENCOUNTER — Encounter (HOSPITAL_COMMUNITY): Payer: Self-pay | Admitting: Emergency Medicine

## 2012-03-06 DIAGNOSIS — R200 Anesthesia of skin: Secondary | ICD-10-CM

## 2012-03-06 DIAGNOSIS — I6990 Unspecified sequelae of unspecified cerebrovascular disease: Secondary | ICD-10-CM | POA: Insufficient documentation

## 2012-03-06 DIAGNOSIS — R29810 Facial weakness: Secondary | ICD-10-CM | POA: Insufficient documentation

## 2012-03-06 DIAGNOSIS — I10 Essential (primary) hypertension: Secondary | ICD-10-CM | POA: Insufficient documentation

## 2012-03-06 DIAGNOSIS — Z79899 Other long term (current) drug therapy: Secondary | ICD-10-CM | POA: Insufficient documentation

## 2012-03-06 DIAGNOSIS — J069 Acute upper respiratory infection, unspecified: Secondary | ICD-10-CM | POA: Insufficient documentation

## 2012-03-06 DIAGNOSIS — R471 Dysarthria and anarthria: Secondary | ICD-10-CM | POA: Insufficient documentation

## 2012-03-06 DIAGNOSIS — R209 Unspecified disturbances of skin sensation: Secondary | ICD-10-CM | POA: Insufficient documentation

## 2012-03-06 LAB — CBC WITH DIFFERENTIAL/PLATELET
Basophils Relative: 0 % (ref 0–1)
Eosinophils Absolute: 0.1 10*3/uL (ref 0.0–0.7)
HCT: 32.7 % — ABNORMAL LOW (ref 36.0–46.0)
Hemoglobin: 10.4 g/dL — ABNORMAL LOW (ref 12.0–15.0)
MCH: 24.8 pg — ABNORMAL LOW (ref 26.0–34.0)
MCHC: 31.8 g/dL (ref 30.0–36.0)
Monocytes Absolute: 0.5 10*3/uL (ref 0.1–1.0)
Monocytes Relative: 6 % (ref 3–12)

## 2012-03-06 LAB — BASIC METABOLIC PANEL
BUN: 8 mg/dL (ref 6–23)
Creatinine, Ser: 0.9 mg/dL (ref 0.50–1.10)
GFR calc Af Amer: 85 mL/min — ABNORMAL LOW (ref >90)
GFR calc non Af Amer: 73 mL/min — ABNORMAL LOW (ref >90)

## 2012-03-06 LAB — POCT I-STAT TROPONIN I: Troponin i, poc: 0.03 ng/mL (ref 0.00–0.08)

## 2012-03-06 MED ORDER — AZITHROMYCIN 250 MG PO TABS: 250.0000 mg | ORAL_TABLET | Freq: Every day | ORAL | Status: DC

## 2012-03-06 NOTE — ED Provider Notes (Signed)
History     CSN: [id number]  Arrival date & time 03/06/12  1134   First MD Initiated Contact with Patient 03/06/12 1459      Chief Complaint  Patient presents with  . Aphasia    (Consider location/radiation/quality/duration/timing/severity/associated sxs/prior treatment) HPI Comments: Patient sent here from pcp's office.  She was seen there today for cough for the past two weeks.  While she was there she mentioned to her pcp that she woke up yesterday with difficulty speaking and right arm heaviness.  This has since resolved.  She denies any headache, weakness, or other complaints.  There is no chest pain.    She has a history of prior cva, prior heart valve issues which she had surgery for.    The history is provided by the patient.    Past Medical History  Diagnosis Date  . Heart disease   . Cancer of thyroid   . Fibroid uterus   . Hypertension     Past Surgical History  Procedure Date  . Cardiac surgery   . Thyroid surgery   . Cesarean section     History reviewed. No pertinent family history.  History  Substance Use Topics  . Smoking status: Former Games developer  . Smokeless tobacco: Not on file  . Alcohol Use: No    OB History    Grav Para Term Preterm Abortions TAB SAB Ect Mult Living   2 2 2       2       Review of Systems  All other systems reviewed and are negative.    Allergies  Review of patient's allergies indicates no known allergies.  Home Medications   Current Outpatient Rx  Name Route Sig Dispense Refill  . ALBUTEROL SULFATE HFA 108 (90 BASE) MCG/ACT IN AERS Inhalation Inhale 2 puffs into the lungs every 6 (six) hours as needed. Takes for shortness of breath    . BECLOMETHASONE DIPROPIONATE 40 MCG/ACT IN AERS Inhalation Inhale 2 puffs into the lungs 2 (two) times daily.    [name] CARVEDILOL 12.5 MG PO TABS Oral Take 12.5 mg by mouth 2 (two) times daily.    [name] CITALOPRAM HYDROBROMIDE 20 MG PO TABS Oral Take 20 mg by mouth daily.    . FUROSEMIDE 40  MG PO TABS Oral Take 40 mg by mouth 2 (two) times daily.    . IBUPROFEN 600 MG PO TABS Oral Take 600 mg by mouth every 6 (six) hours as needed. For pain    . LEVOTHYROXINE SODIUM 100 MCG PO TABS Oral Take 100 mcg by mouth daily.    [name] LISINOPRIL 10 MG PO TABS Oral Take 10 mg by mouth daily.    . TRAMADOL HCL 50 MG PO TABS Oral Take 50 mg by mouth every 8 (eight) hours as needed. Takes for pain      BP 168/61  Pulse 66  Temp 98 F (36.7 C) (Oral)  Resp 16  SpO2 100%  LMP 03/06/2012  Physical Exam  Nursing note and vitals reviewed. Constitutional: She is oriented to person, place, and time. She appears well-developed and well-nourished. No distress.  HENT:  Head: Normocephalic and atraumatic.  Eyes: EOM are normal. Pupils are equal, round, and reactive to light.  Neck: Normal range of motion. Neck supple.  Cardiovascular: Normal rate and regular rhythm.  Exam reveals no gallop and no friction rub.   No murmur heard. Pulmonary/Chest: Effort normal and breath sounds normal. No respiratory distress. She has no wheezes.  Abdominal:  Soft. Bowel sounds are normal. She exhibits no distension. There is no tenderness.  Musculoskeletal: Normal range of motion.  Neurological: She is alert and oriented to person, place, and time. No cranial nerve deficit. She exhibits normal muscle tone. Coordination normal.  Skin: Skin is warm and dry. She is not diaphoretic.    ED Course  Procedures (including critical care time)  Labs Reviewed  CBC WITH DIFFERENTIAL - Abnormal; Notable for the following:    Hemoglobin 10.4 (*)     HCT 32.7 (*)     MCH 24.8 (*)     All other components within normal limits  BASIC METABOLIC PANEL - Abnormal; Notable for the following:    Potassium 3.4 (*)     GFR calc non Af Amer 73 (*)     GFR calc Af Amer 85 (*)     All other components within normal limits  GLUCOSE, CAPILLARY  POCT I-STAT TROPONIN I   Ct Head Wo Contrast  03/06/2012  *RADIOLOGY REPORT*   Clinical Data: Aphasia, intermittent facial droop  CT HEAD WITHOUT CONTRAST  Technique:  Contiguous axial images were obtained from the base of the skull through the vertex without contrast.  Comparison: 12/10/2007; brain MRI of 12/10/2007  Findings: Redemonstrated mild diffuse atrophy.  Grossly unchanged geographic areas of encephalomalacia within the left parietal and occipital lobes.  Mild scattered periventricular hypodensities are grossly unchanged.  No CT evidence of acute large territory infarct.  No intraparenchymal or extra-axial mass or hemorrhage. Unchanged size and configuration of the ventricles and basilar cisterns.  No midline shift.  Limited visualization of the paranasal sinuses and mastoid air cells is normal.  Regional soft tissues are normal.  No calvarial fracture.  IMPRESSION: Stable findings of prior left occipital and parietal lobe infarcts, atrophy and microvascular ischemic disease without definite acute intracranial process.   Original Report Authenticated By: Waynard Reeds, M.D.      No diagnosis found.   Date: 03/06/2012  Rate: 69  Rhythm: normal sinus rhythm  QRS Axis: normal  Intervals: normal  ST/T Wave abnormalities: nonspecific T wave changes  Conduction Disutrbances:none  Narrative Interpretation:   Old EKG Reviewed: unchanged from 10/11/10    MDM  The patient presents here with right arm heaviness, speech difficulty that began yesterday morning and has since resolved.  She is completely neurologically intact and appears well.  The workup performed includes a head ct that shows an old cva unchanged from 2009 and labs and ekg that are unremarkable.    It appears as though she has had a tia, however is back to baseline.  I have discussed cdu with her for tia protocol to have additional studies, however she prefers to go home and schedule this through her pcp.  I have instructed her to take a 325 aspirin daily until that time.  She is to return if her symptoms  worsen or change.        Geoffery Lyons, MD 03/06/12 [phone]

## 2012-03-06 NOTE — ED Notes (Signed)
Pt here c/o some aphasia when waking up yesterday that has almost completely resolved; pt sts some facial droop that resolved also; no obvious neuro deficits at present; pt here for eval; pt sts URI x 2 weeks with cough

## 2012-05-31 DIAGNOSIS — G459 Transient cerebral ischemic attack, unspecified: Secondary | ICD-10-CM | POA: Insufficient documentation

## 2012-06-01 ENCOUNTER — Other Ambulatory Visit: Payer: Self-pay | Admitting: Physician Assistant

## 2012-06-01 DIAGNOSIS — Z1231 Encounter for screening mammogram for malignant neoplasm of breast: Secondary | ICD-10-CM

## 2012-06-12 ENCOUNTER — Ambulatory Visit
Admission: RE | Admit: 2012-06-12 | Discharge: 2012-06-12 | Disposition: A | Discharge status: Home / Self Care | Payer: Medicaid Other | Source: Ambulatory Visit | Attending: Physician Assistant | Admitting: Physician Assistant

## 2012-06-12 DIAGNOSIS — Z1231 Encounter for screening mammogram for malignant neoplasm of breast: Secondary | ICD-10-CM

## 2012-10-11 DIAGNOSIS — R0789 Other chest pain: Secondary | ICD-10-CM | POA: Insufficient documentation

## 2012-11-21 ENCOUNTER — Other Ambulatory Visit: Payer: Self-pay

## 2012-11-21 MED ORDER — LISINOPRIL 10 MG PO TABS: 10.0000 mg | ORAL_TABLET | Freq: Every day | ORAL | Status: DC

## 2012-11-21 MED ORDER — CARVEDILOL 12.5 MG PO TABS: 12.5000 mg | ORAL_TABLET | Freq: Two times a day (BID) | ORAL | Status: DC

## 2012-11-21 MED ORDER — FUROSEMIDE 40 MG PO TABS: 40.0000 mg | ORAL_TABLET | Freq: Two times a day (BID) | ORAL | Status: DC

## 2012-11-21 NOTE — Telephone Encounter (Signed)
Rx was sent to pharmacy electronically. 

## 2012-12-25 ENCOUNTER — Other Ambulatory Visit: Payer: Self-pay | Admitting: Cardiology

## 2012-12-25 NOTE — Telephone Encounter (Signed)
Rx was sent to pharmacy electronically. 

## 2013-01-29 ENCOUNTER — Other Ambulatory Visit: Payer: Self-pay | Admitting: Cardiology

## 2013-01-30 NOTE — Telephone Encounter (Signed)
Rx was sent to pharmacy electronically. 

## 2013-05-02 ENCOUNTER — Emergency Department (HOSPITAL_COMMUNITY)
Admission: EM | Admit: 2013-05-02 | Discharge: 2013-05-02 | Disposition: A | Discharge status: Home / Self Care | Payer: Medicaid Other | Attending: Emergency Medicine | Admitting: Emergency Medicine

## 2013-05-02 ENCOUNTER — Encounter (HOSPITAL_COMMUNITY): Payer: Self-pay | Admitting: Emergency Medicine

## 2013-05-02 DIAGNOSIS — IMO0002 Reserved for concepts with insufficient information to code with codable children: Secondary | ICD-10-CM | POA: Insufficient documentation

## 2013-05-02 DIAGNOSIS — Z85850 Personal history of malignant neoplasm of thyroid: Secondary | ICD-10-CM | POA: Insufficient documentation

## 2013-05-02 DIAGNOSIS — J069 Acute upper respiratory infection, unspecified: Secondary | ICD-10-CM | POA: Insufficient documentation

## 2013-05-02 DIAGNOSIS — Z9889 Other specified postprocedural states: Secondary | ICD-10-CM | POA: Insufficient documentation

## 2013-05-02 DIAGNOSIS — Z79899 Other long term (current) drug therapy: Secondary | ICD-10-CM | POA: Insufficient documentation

## 2013-05-02 DIAGNOSIS — I10 Essential (primary) hypertension: Secondary | ICD-10-CM | POA: Insufficient documentation

## 2013-05-02 DIAGNOSIS — Z8742 Personal history of other diseases of the female genital tract: Secondary | ICD-10-CM | POA: Insufficient documentation

## 2013-05-02 DIAGNOSIS — Z87891 Personal history of nicotine dependence: Secondary | ICD-10-CM | POA: Insufficient documentation

## 2013-05-02 MED ORDER — GUAIFENESIN 100 MG/5ML PO LIQD: 100.0000 mg | ORAL | Status: DC | PRN

## 2013-05-02 MED ORDER — AZITHROMYCIN 250 MG PO TABS: 250.0000 mg | ORAL_TABLET | Freq: Every day | ORAL | Status: DC

## 2013-05-02 NOTE — ED Notes (Signed)
C/o congestion, runny nose, dry cough, cold sweats since Thursday. Took mucinex without relief

## 2013-05-02 NOTE — ED Provider Notes (Signed)
Medical screening examination/treatment/procedure(s) were performed by non-physician practitioner and as supervising physician I was immediately available for consultation/collaboration.  EKG Interpretation   None         Enid Skeens, MD 05/02/13 1623

## 2013-05-02 NOTE — ED Provider Notes (Signed)
CSN: [id number]     Arrival date & time 05/02/13  1024 History  This chart was scribed for non-physician practitioner, Marlon Pel, PA-C  working with Enid Skeens, MD by Luisa Dago, ED scribe. This patient was seen in room TR09C/TR09C and the patient's care was started at 11:25 AM.    Chief Complaint  Patient presents with  . URI   The history is provided by the patient. No language interpreter was used.   HPI Comments: ANACAREN KOHAN is a 52 y.o. female who presents to the Emergency Department complaining of congestion that started one week ago. Pt has associated  Rhinorrhea, sore throat, dry cough, and cold sweat. Pt  states that she took mucinex but it did not relieve her symptoms and only made them worse. Pt has used her inhaler with no relief. Pt got laryngitis 2 days ago. She denies chest pain. She has a history of valve replacement. No fevers. Pt denies being on antibiotics recently.   Past Medical History  Diagnosis Date  . Heart disease   . Cancer of thyroid   . Fibroid uterus   . Hypertension    Past Surgical History  Procedure Laterality Date  . Cardiac surgery    . Thyroid surgery    . Cesarean section     History reviewed. No pertinent family history. History  Substance Use Topics  . Smoking status: Former Games developer  . Smokeless tobacco: Not on file  . Alcohol Use: No   OB History   Grav Para Term Preterm Abortions TAB SAB Ect Mult Living   2 2 2       2      Review of Systems  Constitutional: Negative for fever.  HENT: Positive for congestion, rhinorrhea and sore throat.   Respiratory: Positive for cough.   Cardiovascular: Negative for chest pain.  All other systems reviewed and are negative.    Allergies  Review of patient's allergies indicates no known allergies.  Home Medications   Current Outpatient Rx  Name  Route  Sig  Dispense  Refill  . albuterol (PROVENTIL HFA;VENTOLIN HFA) 108 (90 BASE) MCG/ACT inhaler   Inhalation   Inhale 2  puffs into the lungs 2 (two) times daily. Takes for shortness of breath         . beclomethasone (QVAR) 40 MCG/ACT inhaler   Inhalation   Inhale 2 puffs into the lungs daily.          . carvedilol (COREG) 12.5 MG tablet   Oral   Take 12.5 mg by mouth 2 (two) times daily with a meal.         . citalopram (CELEXA) 20 MG tablet   Oral   Take 20 mg by mouth daily.         . furosemide (LASIX) 20 MG tablet   Oral   Take 20 mg by mouth 2 (two) times daily.         [name] levothyroxine (SYNTHROID, LEVOTHROID) 100 MCG tablet   Oral   Take 100 mcg by mouth daily.         [name] lisinopril (PRINIVIL,ZESTRIL) 10 MG tablet   Oral   Take 10 mg by mouth daily.         . traMADol (ULTRAM) 50 MG tablet   Oral   Take 50 mg by mouth every 6 (six) hours as needed for moderate pain.         [name] azithromycin (ZITHROMAX) 250 MG tablet   Oral  Take 1 tablet (250 mg total) by mouth daily. Take first 2 tablets together, then 1 every day until finished.   6 tablet   0   . guaiFENesin (ROBITUSSIN) 100 MG/5ML liquid   Oral   Take 5-10 mLs (100-200 mg total) by mouth every 4 (four) hours as needed for cough.   60 mL   0    BP 133/68  Pulse 88  Temp(Src) 97.2 F (36.2 C) (Oral)  Resp 16  Ht 4\' 9"  (1.448 m)  Wt 180 lb (81.647 kg)  BMI 38.94 kg/m2  SpO2 100% Physical Exam  Nursing note and vitals reviewed. Constitutional: She is oriented to person, place, and time. She appears well-developed and well-nourished. No distress.  HENT:  Head: Normocephalic and atraumatic.  Right Ear: Tympanic membrane, external ear and ear canal normal.  Left Ear: Tympanic membrane, external ear and ear canal normal.  Patient has raspy voice. Nasal congestion.  Eyes: EOM are normal.  Neck: Neck supple. No tracheal deviation present.  Cardiovascular: Normal rate, regular rhythm and normal heart sounds.   Pulmonary/Chest: Effort normal. No respiratory distress.  Mildly decreased air movement  bilaterally.   Musculoskeletal: Normal range of motion.  Neurological: She is alert and oriented to person, place, and time.  Skin: Skin is warm and dry.  Psychiatric: She has a normal mood and affect. Her behavior is normal.    ED Course  Procedures (including critical care time)  DIAGNOSTIC STUDIES: Oxygen Saturation is 100% on room air, normal by my interpretation.    COORDINATION OF CARE: 1:05 PM-Discussed treatment plan which includes antibiotics, and cough supressant  with pt at bedside and pt agreed to plan.   Labs Review Labs Reviewed - No data to display Imaging Review No results found.  EKG Interpretation   None       MDM   1. URI (upper respiratory infection)    Rx:  Guaifenesin with codeine  Azithromycin  52 y.o.Lucielle L Verrilli's evaluation in the Emergency Department is complete. It has been determined that no acute conditions requiring further emergency intervention are present at this time. The patient/guardian have been advised of the diagnosis and plan. We have discussed signs and symptoms that warrant return to the ED, such as changes or worsening in symptoms.  Vital signs are stable at discharge. Filed Vitals:   05/02/13 1029  BP: 133/68  Pulse: 88  Temp: 97.2 F (36.2 C)  Resp: 16    Patient/guardian has voiced understanding and agreed to follow-up with the PCP or specialist.  I personally performed the services described in this documentation, which was scribed in my presence. The recorded information has been reviewed and is accurate.    Dorthula Matas, PA-C 05/02/13 1307

## 2013-12-16 ENCOUNTER — Encounter (HOSPITAL_COMMUNITY): Payer: Self-pay | Admitting: Emergency Medicine

## 2013-12-16 ENCOUNTER — Telehealth: Payer: Self-pay | Admitting: Cardiology

## 2013-12-16 ENCOUNTER — Inpatient Hospital Stay (HOSPITAL_COMMUNITY)
Admission: EM | Admit: 2013-12-16 | Discharge: 2013-12-19 | DRG: 603 | Disposition: A | Discharge status: Home / Self Care | Payer: Medicaid Other | Attending: Internal Medicine | Admitting: Internal Medicine

## 2013-12-16 ENCOUNTER — Inpatient Hospital Stay (HOSPITAL_COMMUNITY): Payer: Medicaid Other

## 2013-12-16 DIAGNOSIS — E890 Postprocedural hypothyroidism: Secondary | ICD-10-CM | POA: Diagnosis present

## 2013-12-16 DIAGNOSIS — F3289 Other specified depressive episodes: Secondary | ICD-10-CM | POA: Diagnosis present

## 2013-12-16 DIAGNOSIS — I429 Cardiomyopathy, unspecified: Secondary | ICD-10-CM

## 2013-12-16 DIAGNOSIS — M542 Cervicalgia: Secondary | ICD-10-CM | POA: Diagnosis present

## 2013-12-16 DIAGNOSIS — I10 Essential (primary) hypertension: Secondary | ICD-10-CM | POA: Diagnosis present

## 2013-12-16 DIAGNOSIS — Z85850 Personal history of malignant neoplasm of thyroid: Secondary | ICD-10-CM

## 2013-12-16 DIAGNOSIS — L738 Other specified follicular disorders: Secondary | ICD-10-CM | POA: Diagnosis present

## 2013-12-16 DIAGNOSIS — D899 Disorder involving the immune mechanism, unspecified: Secondary | ICD-10-CM | POA: Diagnosis present

## 2013-12-16 DIAGNOSIS — E785 Hyperlipidemia, unspecified: Secondary | ICD-10-CM | POA: Diagnosis present

## 2013-12-16 DIAGNOSIS — E039 Hypothyroidism, unspecified: Secondary | ICD-10-CM | POA: Diagnosis present

## 2013-12-16 DIAGNOSIS — L0390 Cellulitis, unspecified: Secondary | ICD-10-CM | POA: Diagnosis present

## 2013-12-16 DIAGNOSIS — IMO0002 Reserved for concepts with insufficient information to code with codable children: Secondary | ICD-10-CM | POA: Diagnosis present

## 2013-12-16 DIAGNOSIS — E669 Obesity, unspecified: Secondary | ICD-10-CM | POA: Diagnosis present

## 2013-12-16 DIAGNOSIS — Z954 Presence of other heart-valve replacement: Secondary | ICD-10-CM

## 2013-12-16 DIAGNOSIS — Z9889 Other specified postprocedural states: Secondary | ICD-10-CM

## 2013-12-16 DIAGNOSIS — H109 Unspecified conjunctivitis: Secondary | ICD-10-CM | POA: Diagnosis present

## 2013-12-16 DIAGNOSIS — Z87891 Personal history of nicotine dependence: Secondary | ICD-10-CM | POA: Diagnosis not present

## 2013-12-16 DIAGNOSIS — F329 Major depressive disorder, single episode, unspecified: Secondary | ICD-10-CM | POA: Diagnosis present

## 2013-12-16 DIAGNOSIS — I428 Other cardiomyopathies: Secondary | ICD-10-CM | POA: Diagnosis present

## 2013-12-16 DIAGNOSIS — L739 Follicular disorder, unspecified: Secondary | ICD-10-CM

## 2013-12-16 DIAGNOSIS — I5181 Takotsubo syndrome: Secondary | ICD-10-CM | POA: Diagnosis present

## 2013-12-16 DIAGNOSIS — L678 Other hair color and hair shaft abnormalities: Secondary | ICD-10-CM | POA: Diagnosis present

## 2013-12-16 DIAGNOSIS — B370 Candidal stomatitis: Secondary | ICD-10-CM | POA: Diagnosis present

## 2013-12-16 DIAGNOSIS — I509 Heart failure, unspecified: Secondary | ICD-10-CM | POA: Diagnosis present

## 2013-12-16 DIAGNOSIS — Z6841 Body Mass Index (BMI) 40.0 and over, adult: Secondary | ICD-10-CM | POA: Diagnosis not present

## 2013-12-16 DIAGNOSIS — H1089 Other conjunctivitis: Secondary | ICD-10-CM

## 2013-12-16 DIAGNOSIS — L03119 Cellulitis of unspecified part of limb: Secondary | ICD-10-CM | POA: Diagnosis present

## 2013-12-16 DIAGNOSIS — I099 Rheumatic heart disease, unspecified: Secondary | ICD-10-CM | POA: Diagnosis present

## 2013-12-16 DIAGNOSIS — Z952 Presence of prosthetic heart valve: Secondary | ICD-10-CM

## 2013-12-16 DIAGNOSIS — L03111 Cellulitis of right axilla: Secondary | ICD-10-CM

## 2013-12-16 HISTORY — DX: Hyperlipidemia, unspecified: E78.5

## 2013-12-16 HISTORY — DX: Essential (primary) hypertension: I10

## 2013-12-16 HISTORY — DX: Hypothyroidism, unspecified: E03.9

## 2013-12-16 LAB — CBC WITH DIFFERENTIAL/PLATELET
Basophils Absolute: 0 10*3/uL (ref 0.0–0.1)
Basophils Relative: 0 % (ref 0–1)
Eosinophils Absolute: 0.2 10*3/uL (ref 0.0–0.7)
Eosinophils Relative: 2 % (ref 0–5)
HEMATOCRIT: 37.2 % (ref 36.0–46.0)
Hemoglobin: 12.2 g/dL (ref 12.0–15.0)
LYMPHS PCT: 11 % — AB (ref 12–46)
Lymphs Abs: 1.1 10*3/uL (ref 0.7–4.0)
MCH: 28.6 pg (ref 26.0–34.0)
MCHC: 32.8 g/dL (ref 30.0–36.0)
MCV: 87.1 fL (ref 78.0–100.0)
MONO ABS: 0.7 10*3/uL (ref 0.1–1.0)
Monocytes Relative: 7 % (ref 3–12)
NEUTROS ABS: 8.3 10*3/uL — AB (ref 1.7–7.7)
Neutrophils Relative %: 80 % — ABNORMAL HIGH (ref 43–77)
Platelets: 153 10*3/uL (ref 150–400)
RBC: 4.27 MIL/uL (ref 3.87–5.11)
RDW: 14.6 % (ref 11.5–15.5)
WBC: 10.2 10*3/uL (ref 4.0–10.5)

## 2013-12-16 LAB — BASIC METABOLIC PANEL
ANION GAP: 16 — AB (ref 5–15)
BUN: 8 mg/dL (ref 6–23)
CHLORIDE: 99 meq/L (ref 96–112)
CO2: 19 meq/L (ref 19–32)
CREATININE: 0.81 mg/dL (ref 0.50–1.10)
Calcium: 8.3 mg/dL — ABNORMAL LOW (ref 8.4–10.5)
GFR calc Af Amer: >90 mL/min (ref >90)
GFR calc non Af Amer: 82 mL/min — ABNORMAL LOW (ref >90)
Glucose, Bld: 94 mg/dL (ref 70–99)
Potassium: 4.5 mEq/L (ref 3.7–5.3)
Sodium: 134 mEq/L — ABNORMAL LOW (ref 137–147)

## 2013-12-16 LAB — HEMOGLOBIN A1C
Hgb A1c MFr Bld: 6 % — ABNORMAL HIGH (ref <5.7)
Mean Plasma Glucose: 126 mg/dL — ABNORMAL HIGH (ref <117)

## 2013-12-16 LAB — HIV ANTIBODY (ROUTINE TESTING W REFLEX): HIV 1&2 Ab, 4th Generation: NONREACTIVE

## 2013-12-16 LAB — CBG MONITORING, ED: GLUCOSE-CAPILLARY: 102 mg/dL — AB (ref 70–99)

## 2013-12-16 MED ORDER — ACETAMINOPHEN 650 MG RE SUPP: 650.0000 mg | Freq: Four times a day (QID) | RECTAL | Status: DC | PRN

## 2013-12-16 MED ORDER — VANCOMYCIN HCL IN DEXTROSE 1-5 GM/200ML-% IV SOLN
1000.0000 mg | Freq: Once | INTRAVENOUS | Status: AC
  Administered 2013-12-16: 1000 mg via INTRAVENOUS
  Filled 2013-12-16: qty 200

## 2013-12-16 MED ORDER — OXYCODONE-ACETAMINOPHEN 5-325 MG PO TABS
1.0000 | ORAL_TABLET | Freq: Once | ORAL | Status: AC
Start: 2013-12-16 — End: 2013-12-16
  Administered 2013-12-16: 1 via ORAL
  Filled 2013-12-16: qty 1

## 2013-12-16 MED ORDER — FUROSEMIDE 20 MG PO TABS
20.0000 mg | ORAL_TABLET | Freq: Two times a day (BID) | ORAL | Status: DC
  Administered 2013-12-16 – 2013-12-19 (×6): 20 mg via ORAL
  Filled 2013-12-16 (×10): qty 1

## 2013-12-16 MED ORDER — POLYMYXIN B-TRIMETHOPRIM 10000-0.1 UNIT/ML-% OP SOLN
2.0000 [drp] | OPHTHALMIC | Status: DC
  Administered 2013-12-16 – 2013-12-19 (×14): 2 [drp] via OPHTHALMIC
  Filled 2013-12-16: qty 10

## 2013-12-16 MED ORDER — FLUCONAZOLE 200 MG PO TABS
200.0000 mg | ORAL_TABLET | Freq: Once | ORAL | Status: AC
  Administered 2013-12-16: 200 mg via ORAL
  Filled 2013-12-16 (×2): qty 1

## 2013-12-16 MED ORDER — ONDANSETRON HCL 4 MG/2ML IJ SOLN: 4.0000 mg | Freq: Three times a day (TID) | INTRAMUSCULAR | Status: AC | PRN

## 2013-12-16 MED ORDER — FLUTICASONE PROPIONATE HFA 44 MCG/ACT IN AERO
2.0000 | INHALATION_SPRAY | Freq: Two times a day (BID) | RESPIRATORY_TRACT | Status: DC
  Administered 2013-12-16 – 2013-12-19 (×6): 2 via RESPIRATORY_TRACT
  Filled 2013-12-16: qty 10.6

## 2013-12-16 MED ORDER — CARVEDILOL 12.5 MG PO TABS
12.5000 mg | ORAL_TABLET | Freq: Two times a day (BID) | ORAL | Status: DC
  Administered 2013-12-16 – 2013-12-19 (×4): 12.5 mg via ORAL
  Filled 2013-12-16 (×9): qty 1

## 2013-12-16 MED ORDER — LISINOPRIL 10 MG PO TABS
10.0000 mg | ORAL_TABLET | Freq: Every day | ORAL | Status: DC
  Administered 2013-12-17 – 2013-12-19 (×3): 10 mg via ORAL
  Filled 2013-12-16 (×4): qty 1

## 2013-12-16 MED ORDER — ALBUTEROL SULFATE HFA 108 (90 BASE) MCG/ACT IN AERS: 2.0000 | INHALATION_SPRAY | Freq: Two times a day (BID) | RESPIRATORY_TRACT | Status: DC | PRN

## 2013-12-16 MED ORDER — CITALOPRAM HYDROBROMIDE 20 MG PO TABS
20.0000 mg | ORAL_TABLET | Freq: Every day | ORAL | Status: DC
  Administered 2013-12-17 – 2013-12-19 (×3): 20 mg via ORAL
  Filled 2013-12-16 (×4): qty 1

## 2013-12-16 MED ORDER — ALBUTEROL SULFATE (2.5 MG/3ML) 0.083% IN NEBU: 2.5000 mg | INHALATION_SOLUTION | Freq: Two times a day (BID) | RESPIRATORY_TRACT | Status: DC | PRN

## 2013-12-16 MED ORDER — HYDROMORPHONE HCL PF 1 MG/ML IJ SOLN
1.0000 mg | INTRAMUSCULAR | Status: AC | PRN
  Administered 2013-12-16: 1 mg via INTRAVENOUS
  Filled 2013-12-16: qty 1

## 2013-12-16 MED ORDER — FLUCONAZOLE 100 MG PO TABS
100.0000 mg | ORAL_TABLET | Freq: Every day | ORAL | Status: DC
  Administered 2013-12-17 – 2013-12-19 (×3): 100 mg via ORAL
  Filled 2013-12-16 (×3): qty 1

## 2013-12-16 MED ORDER — LEVOTHYROXINE SODIUM 100 MCG PO TABS
100.0000 ug | ORAL_TABLET | Freq: Every day | ORAL | Status: DC
  Administered 2013-12-17 – 2013-12-19 (×3): 100 ug via ORAL
  Filled 2013-12-16 (×5): qty 1

## 2013-12-16 MED ORDER — ENOXAPARIN SODIUM 40 MG/0.4ML ~~LOC~~ SOLN
40.0000 mg | SUBCUTANEOUS | Status: DC
  Administered 2013-12-16 – 2013-12-18 (×3): 40 mg via SUBCUTANEOUS
  Filled 2013-12-16 (×4): qty 0.4

## 2013-12-16 MED ORDER — VANCOMYCIN HCL 10 G IV SOLR
1500.0000 mg | INTRAVENOUS | Status: DC
  Administered 2013-12-17 – 2013-12-18 (×2): 1500 mg via INTRAVENOUS
  Filled 2013-12-16 (×3): qty 1500

## 2013-12-16 MED ORDER — ACETAMINOPHEN 325 MG PO TABS
650.0000 mg | ORAL_TABLET | Freq: Four times a day (QID) | ORAL | Status: DC | PRN
  Administered 2013-12-18: 650 mg via ORAL
  Filled 2013-12-16: qty 2

## 2013-12-16 MED ORDER — VANCOMYCIN HCL 500 MG IV SOLR
500.0000 mg | INTRAVENOUS | Status: AC
  Administered 2013-12-16: 500 mg via INTRAVENOUS
  Filled 2013-12-16: qty 500

## 2013-12-16 MED ORDER — TRAMADOL HCL 50 MG PO TABS
50.0000 mg | ORAL_TABLET | Freq: Four times a day (QID) | ORAL | Status: DC | PRN
  Administered 2013-12-17 – 2013-12-19 (×6): 50 mg via ORAL
  Filled 2013-12-16 (×6): qty 1

## 2013-12-16 NOTE — ED Notes (Addendum)
Charting error.

## 2013-12-16 NOTE — ED Notes (Signed)
MD's at bedside. Report given to Luellen Pucker, Therapist, sports.

## 2013-12-16 NOTE — ED Notes (Signed)
Admitting MD's in.

## 2013-12-16 NOTE — ED Notes (Addendum)
Was in room to do EKG and admitting DR told this NT to cancel the EKG

## 2013-12-16 NOTE — ED Notes (Signed)
IV therapy in trying to get IV access.

## 2013-12-16 NOTE — ED Notes (Signed)
IV Therapy still attempting to get IV access.

## 2013-12-16 NOTE — ED Notes (Signed)
IV team paged for IV start. 

## 2013-12-16 NOTE — ED Notes (Signed)
Labs and blood cultures drawn.

## 2013-12-16 NOTE — H&P (Signed)
Date: 12/16/2013               Patient Name:  [name] MRN: [id number]  DOB: 11-14-1960 Age / Sex: 53 y.o., female   PCP: Aura Dials, PA-C         Medical Service: Internal Medicine Teaching Service         Attending Physician: Dr. Karren Cobble, MD    First Contact: Dr. Venita Lick Pager: [phone]  Second Contact: Dr. Jerene Pitch Pager: [phone]       After Hours (After 5p/  First Contact Pager: [phone]  weekends / holidays): Second Contact Pager: [phone]   Chief Complaint: right armpit bug bite with infection for the past 4-5 days  History of Present Illness: 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 She reports that 4-5 days ago, she was walking outside and felt an insect sting in her right axilla. The area became quite pruritic, and she treated with cortisone. She then developed "blisters" the next morning in the same area. She treated these with alcohol and hydrogen peroxide. Over the ensuing days, her "blisters" got worse and yesterday, they started to open and drain. She has also noticed some fevers and night sweats. Finally, she developed a foreign body sensation in her eyes that is worse when they are open. Her eyes have become red and she has drainage in the morning upon awakening. She has noticed that light bothers her a bit more than usual, but her vision is unchanged. She tried normal saline drops in her eyes; they did not help.   Meds: Current Facility-Administered Medications  Medication Dose Route Frequency Provider Last Rate Last Dose  . trimethoprim-polymyxin b (POLYTRIM) ophthalmic solution 2 drop  2 drop Both Eyes 6 times per day Emily West, PA-C      . vancomycin (VANCOCIN) IVPB 1000 mg/200 mL premix   1,000 mg Intravenous Once [name], PA-C       Current Outpatient Prescriptions  Medication Sig Dispense Refill  . albuterol (PROVENTIL HFA;VENTOLIN HFA) 108 (90 BASE) MCG/ACT inhaler Inhale 2 puffs into the lungs 2 (two) times daily. Takes for shortness of breath      . beclomethasone (QVAR) 40 MCG/ACT inhaler Inhale 2 puffs into the lungs daily.       . carvedilol (COREG) 12.5 MG tablet Take 12.5 mg by mouth 2 (two) times daily with a meal.      . citalopram (CELEXA) 20 MG tablet Take 20 mg by mouth daily.      . furosemide (LASIX) 20 MG tablet Take 20 mg by mouth 2 (two) times daily.      [name] levothyroxine (SYNTHROID, LEVOTHROID) 100 MCG tablet Take 100 mcg by mouth daily.      [name] lisinopril (PRINIVIL,ZESTRIL) 10 MG tablet Take 10 mg by mouth daily.      . traMADol (ULTRAM) 50 MG tablet Take 50 mg by mouth every 6 (six) hours as needed for moderate pain.        Allergies: Allergies as of 12/16/2013  . (No Known Allergies)   Past Medical History  Diagnosis Date  . Heart disease   . Cancer of thyroid   . Fibroid uterus   . Hypertension    Past Surgical History  Procedure Laterality Date  . Cardiac surgery    . Thyroid surgery    . Cesarean section     No family history on file. History   Social History  . Marital Status: Single    Spouse Name: N/A    Number of Children: N/A  . Years of Education: N/A   Occupational History  . Not on file.   Social History Main Topics  . Smoking status: Former Research scientist (life sciences)  . Smokeless tobacco: Not on file  . Alcohol Use: No  . Drug Use: No  . Sexual Activity: Not Currently   Other Topics Concern  . Not on file   Social History Narrative  . No narrative on file   Review of Systems: General: chills, fever, no fatigue, no weight changes Skin: see above, also keloid formation at cardiac surgical scar HEENT: occasional long-standing right eye pain, see above for remainder of review of eyes, no headaches Cardiac: history of cardiac  surgery, some chest pain at incision site, no palpitations  Respiratory: no shortness of breath GI: normal BMs Urinary: no frequency, dysuria, or hematuria Vascular: no calf pain on walking Msk: no muscular or joint pain Endocrine: history of thyroid cancer, no heat or cold intolerance, no changes in weight Psychiatric: some anxiety/depression  Physical Exam: Blood pressure 138/57, pulse 67, temperature 99.3 F (37.4 C), temperature source Oral, SpO2 100.00%. Appearance: pleasant woman lying down in bed in NAD HEENT: /AT, right side of head is shaved as part of hairdo, significant thrush covering tongue, poor dentition (most teeth are missing), conjunctival injection OU, full motility OU, equally reactive pupils with no RAPD, no lid edema or ptosis, no preauricular lymphadeopathy, no clavicular lymphadenopathy but some tenderness to palpation of right mid-clavicular area. Acanthosis nigricans at base of posterior neck. Heart: holosystolic blowing murmur, regular rate, no rubs or gallops, significant keloid scar formation at cardiac surgical site Lungs: CTAB, no W/R/R Abdomen: obese abdomen, +BS, no tenderness to palpation, difficult to assess for hepatosplenomegaly Skin: right axillary pustular lesions, some draining, some intact, with circular area of edema, tenderness and erythema, which extends 1.5 inches from central pustular area. Two areas of darkening around deep holes/indentations on the edges of her drainage. Some residual swelling tracking along the patient's stretch marks, distal to the main area of interest. No odor. Left axilla has several flat scars. Neurologic: A&Ox3, sensation and strength grossly intact Extremities: no lower extremity edema, palpable pulses Psychiatric: does not appear depressed  12/16/13 image on admission:    Lab results: Basic Metabolic Panel:  Recent Labs  12/16/13 1243  NA 134*  K 4.5  CL 99  CO2 19  GLUCOSE 94  BUN 8  CREATININE 0.81   CALCIUM 8.3*   CBG:  Recent Labs  12/16/13 1146  GLUCAP 102*   Urine Drug Screen: Drugs of Abuse     Component Value Date/Time   LABOPIA NONE DETECTED 12/10/2007 0454   COCAINSCRNUR NONE DETECTED 12/10/2007 0454   LABBENZ NONE DETECTED 12/10/2007 0454   AMPHETMU NONE DETECTED 12/10/2007 0454   THCU NONE DETECTED 12/10/2007 0454   LABBARB  Value: NONE DETECTED        DRUG SCREEN FOR MEDICAL PURPOSES ONLY.  IF CONFIRMATION IS NEEDED FOR ANY PURPOSE, NOTIFY LAB WITHIN 5 DAYS. 12/10/2007 0454    HgbA1c pending  HIV pending  Blood cultures pending  Assessment & Plan by Problem: Principal Problem:   Cellulitis Active Problems:   Conjunctivitis of both eyes  This 53 year old  woman with a history of "boils" in her left axilla is presenting with right axillary cellulitis with open pustules and bacterial conjunctivitis in both eyes. Her skin finding is likely a strep infection, especially with her classic story of a precipitating insect bite. It is concerning for MRSA in that the patient has experienced chills and subjective fever over the past few days. Considering her scars in her left axilla, hidradenitis suppurativa must also be considered. Her conjunctival erythema is likely autoinnoculated bacterial conjunctivitis that spread from her axillary infection through the patient's own contact with her eyes (she admits to touching her eyes over the past few days).   Axillary cellulitis and pustular lesions: likely group A strep or MRSA skin infection, complicated by follicular pustules vs hidradenitis suppurativa. If the latter, this would classify as hurley stage 1. The classic story of an insect bite provocation and the lack of an odor make a strep/MRSA infection most likely. Groin and base of breasts have no signs of current or previous hidradenitis suppurativa.  - vancomycin IV treatment initiated  - f/u US right upper extremity to survey for underlying abscess  - Hydromorphone for pain  control along with home tramadol - Contact isolation for possible MRSA, ESBL, RSV  Bacterial conjunctivitis: - trimethoprim-polymyxin B gtts initiated (2 drops OU q4 hours) with hot compresses for symptomatic relief - warm compresses for symptomatic relief  History of valve replacement/repair: patient's records refer to an aortic valve replacement, which appears to have an annulus on 2013 CXR and a mitral valve repair. However, some records also refer to the mitral valve as having been replaced. This history must be further investigated to manage the patient's need (or lack of a need) for anticoagulation. Patient denies ever having been on anticoagulation.  Hypertension:  - resume home carvedilol and carvedilol  Hypothyroidism s/p thyroid cancer: need to clarify history  - home dose of levothyroxine  Depression:  - resume home citalopram  Diet: HH DVT PPx: lovenox Dispo: Disposition is deferred at this time, awaiting improvement of current medical problems. Anticipated discharge in approximately 2 day(s).   The patient does have a current PCP Aura Dials, PA-C) and does not need an [facility] hospital follow-up appointment after discharge.  The patient does not have transportation limitations that hinder transportation to clinic appointments.  Signed: Jerene Pitch, MD 12/16/2013, 1:47 PM

## 2013-12-16 NOTE — Telephone Encounter (Signed)
Received call from [place] she wanted to know if patient

## 2013-12-16 NOTE — ED Provider Notes (Signed)
CSN: [id number]     Arrival date & time 12/16/13  1051 History  This chart was scribed for Colleen Bibles, PA-C, non-physician practitioner working with Babette Relic, MD by Vernell Barrier, ED scribe. This patient was seen in room TR05C/TR05C and the patient's care was started at 11:36 AM.      Chief Complaint  Patient presents with  . Rash   The history is provided by the patient. No language interpreter was used.   HPI Comments: [name] is a 53 y.o. female who presents to the Emergency Department complaining of a painful, itchy axillary rash; initial onset 4 days ago. Pain began the day after onset. Redness, swelling, warmth, and drainage onset 1 day ago. Constantly draining; states she had to keep the area covered. Using peroxide to clean the area. Also notes some redness, itchiness, burning, and pus drainage in her eyes. States she believes she was bitten by something in her axilla 4 days ago. Drinking fluids more frequently. Notes increased BM as well. Denies fevers, chills, myalgias, CP, SOB, abdominal pain, N/V.  Past Medical History  Diagnosis Date  . Heart disease   . Cancer of thyroid   . Fibroid uterus   . Hypertension    Past Surgical History  Procedure Laterality Date  . Cardiac surgery    . Thyroid surgery    . Cesarean section     No family history on file. History  Substance Use Topics  . Smoking status: Former Research scientist (life sciences)  . Smokeless tobacco: Not on file  . Alcohol Use: No   OB History   Grav Para Term Preterm Abortions TAB SAB Ect Mult Living   2 2 2       2      Review of Systems  Gastrointestinal: Negative for vomiting and blood in stool.  Genitourinary: Negative for dysuria.  Skin: Positive for rash.  All other systems reviewed and are negative.  Allergies  Review of patient's allergies indicates no known allergies.  Home Medications   Prior to Admission medications   Medication Sig Start Date End Date Taking? Authorizing Provider  albuterol  (PROVENTIL HFA;VENTOLIN HFA) 108 (90 BASE) MCG/ACT inhaler Inhale 2 puffs into the lungs 2 (two) times daily. Takes for shortness of breath    Historical Provider, MD  azithromycin (ZITHROMAX) 250 MG tablet Take 1 tablet (250 mg total) by mouth daily. Take first 2 tablets together, then 1 every day until finished. 05/02/13   Tiffany Marilu Favre, PA-C  beclomethasone (QVAR) 40 MCG/ACT inhaler Inhale 2 puffs into the lungs daily.     Historical Provider, MD  carvedilol (COREG) 12.5 MG tablet Take 12.5 mg by mouth 2 (two) times daily with a meal.    Historical Provider, MD  citalopram (CELEXA) 20 MG tablet Take 20 mg by mouth daily.    Historical Provider, MD  furosemide (LASIX) 20 MG tablet Take 20 mg by mouth 2 (two) times daily.    Historical Provider, MD  guaiFENesin (ROBITUSSIN) 100 MG/5ML liquid Take 5-10 mLs (100-200 mg total) by mouth every 4 (four) hours as needed for cough. 05/02/13   Tiffany Marilu Favre, PA-C  levothyroxine (SYNTHROID, LEVOTHROID) 100 MCG tablet Take 100 mcg by mouth daily.    Historical Provider, MD  lisinopril (PRINIVIL,ZESTRIL) 10 MG tablet Take 10 mg by mouth daily.    Historical Provider, MD  traMADol (ULTRAM) 50 MG tablet Take 50 mg by mouth every 6 (six) hours as needed for moderate pain.    Historical  Provider, MD   Triage vitals: BP 138/57  Pulse 67  Temp(Src) 99.3 F (37.4 C) (Oral)  SpO2 100%  Physical Exam  Nursing note and vitals reviewed. Constitutional: She appears well-developed and well-nourished. No distress.  HENT:  Head: Normocephalic and atraumatic.  Eyes: EOM are normal. Pupils are equal, round, and reactive to light. Right eye exhibits discharge. Left eye exhibits discharge. Right conjunctiva is injected. Right conjunctiva has no hemorrhage. Left conjunctiva is injected. Left conjunctiva has no hemorrhage. No scleral icterus.  Neck: Neck supple.  Pulmonary/Chest: Effort normal.  Neurological: She is alert.  Skin: Lesion noted. She is not  diaphoretic. There is erythema.  Right axilla and spreading into anterior right shoulder has erythema, edema, and warmth. Purulent discharge. Multiple bolus lesions.     ED Course  Procedures (including critical care time) DIAGNOSTIC STUDIES: Oxygen Saturation is 100% on room air, normal by my interpretation.    COORDINATION OF CARE: At 11:43 AM: Discussed treatment plan with patient which includes consultation with a providing physician for further evaluation. Patient agrees.   11:56 AM: Dr. Stevie Kern has also seen and examined; Reccommended admission for observation and IV vancomycin.   Labs Review Labs Reviewed  CBG MONITORING, ED - Abnormal; Notable for the following:    Glucose-Capillary 102 (*)    All other components within normal limits  CULTURE, BLOOD (ROUTINE X 2)  CULTURE, BLOOD (ROUTINE X 2)  CBC WITH DIFFERENTIAL  BASIC METABOLIC PANEL  CBC WITH DIFFERENTIAL  HIV ANTIBODY (ROUTINE TESTING)    Imaging Review No results found.   EKG Interpretation None      1:12 PM Patient admitted to internal medicine.  They request blood cultures prior to antibiotics.    MDM   Final diagnoses:  Cellulitis of right axilla  Folliculitis  Bilateral conjunctivitis    Afebrile nontoxic immunocompetent patient with right axillary cellulitis with bullous folliculitis and purulent discharge.  Also with bilateral conjunctivitis, likely seeded from large amount of purulence of axilla.  IV vancomycin ordered in ED.  CBG is 102. Admitted to unassigned medicine, Internal Medicine/Teaching Service.  Pt updated and agrees with plan.    I personally performed the services described in this documentation, which was scribed in my presence. The recorded information has been reviewed and is accurate.     Colleen Bibles, PA-C 12/16/13 1318

## 2013-12-16 NOTE — Progress Notes (Signed)
ANTIBIOTIC CONSULT NOTE - INITIAL  Pharmacy Consult for vancomycin Indication: right axillary cellulits  No Known Allergies  Patient Measurements:   Adjusted Body Weight:   Vital Signs: Temp: 99.3 F (37.4 C) (07/27 1102) Temp src: Oral (07/27 1102) BP: 110/66 mmHg (07/27 1520) Pulse Rate: 60 (07/27 1520) Intake/Output from previous day:   Intake/Output from this shift: Total I/O In: 250 [I.V.:250] Out: -   Labs:  Recent Labs  12/16/13 1243 12/16/13 1335  WBC  --  10.2  HGB  --  12.2  PLT  --  153  CREATININE 0.81  --    The CrCl is unknown because both a height and weight (above a minimum accepted value) are required for this calculation. No results found for this basename: VANCOTROUGH, VANCOPEAK, VANCORANDOM, GENTTROUGH, GENTPEAK, GENTRANDOM, TOBRATROUGH, TOBRAPEAK, TOBRARND, AMIKACINPEAK, AMIKACINTROU, AMIKACIN,  in the last 72 hours   Microbiology: No results found for this or any previous visit (from the past 720 hour(s)).  Medical History: Past Medical History  Diagnosis Date  . Heart disease   . Cancer of thyroid   . Fibroid uterus   . Hypertension     Medications:  Scheduled:  . carvedilol  12.5 mg Oral BID WC  . citalopram  20 mg Oral Daily  . enoxaparin (LOVENOX) injection  40 mg Subcutaneous Q24H  . [START ON 12/17/2013] fluconazole  100 mg Oral Daily  . fluconazole  200 mg Oral Once  . fluticasone  2 puff Inhalation BID  . furosemide  20 mg Oral BID  . levothyroxine  100 mcg Oral Daily  . [START ON 12/17/2013] lisinopril  10 mg Oral Daily  . trimethoprim-polymyxin b  2 drop Both Eyes 6 times per day   Infusions:   Assessment: 53 yo female with right axillary cellulitis will be started on vancomycin. SCr is 0.81 (CrCl ~58.4).  Patient received one dose of vancomycin 1g iv x1 at 1448 on 12/16/13.   Goal of Therapy:  Vancomycin trough level 10-15 mcg/ml  Plan:  1) Vancomycin 500mg  iv x1 stat, then start Vancomycin 1500mg  iv q24h, 1st dose  at 1800 tomorrow. 2) Monitor renal function and check vancomycin trough when it's appropriate.   Makenley Shimp, Tsz-Yin 12/16/2013,4:43 PM

## 2013-12-16 NOTE — ED Provider Notes (Signed)
Medical screening examination/treatment/procedure(s) were conducted as a shared visit with non-physician practitioner(s) and myself.  I personally evaluated the patient during the encounter.  Right axilla cellulitis and pustular folliculitis doubt necrotizing fasciitis; also has bilateral conjunctivitis   Babette Relic, MD 12/16/13 2200

## 2013-12-16 NOTE — ED Notes (Signed)
IV attempt x 1. Poor vein selection.

## 2013-12-16 NOTE — Telephone Encounter (Signed)
Dr.Qureshi wanting to know if patient had a AVR or AV repair.Requested old paper chart.12/23/2008 operative note by Dr.Owen mitral valve repair and aortic valve repair.

## 2013-12-16 NOTE — ED Notes (Signed)
Admitting MD's in. Canceled EKG.

## 2013-12-16 NOTE — ED Notes (Addendum)
Pt started 3-4 days ago with what she thought was a "bug bite" to right axilla. Pt has large red area with multiple blisters and drainage. Appears to be extremely painful. Pt also has red, irritated eyes with yellow exudate. States started at the same time her axilla infection started.

## 2013-12-16 NOTE — ED Notes (Signed)
More MD's in.

## 2013-12-17 ENCOUNTER — Encounter (HOSPITAL_COMMUNITY): Payer: Self-pay | Admitting: General Practice

## 2013-12-17 DIAGNOSIS — L738 Other specified follicular disorders: Secondary | ICD-10-CM

## 2013-12-17 DIAGNOSIS — L678 Other hair color and hair shaft abnormalities: Secondary | ICD-10-CM

## 2013-12-17 DIAGNOSIS — B370 Candidal stomatitis: Secondary | ICD-10-CM

## 2013-12-17 NOTE — H&P (Signed)
Internal Medicine Attending Admission Note Date: 12/17/2013  Patient name: [name] Medical record number: [id number] Date of birth: 04/12/1961 Age: 53 y.o. Gender: female  I saw and evaluated the patient. I reviewed the resident's note and I agree with the resident's findings and plan as documented in the resident's note.  Chief Complaint(s): Right armpit infection  History - key components related to admission:  [name] is a 53 year old woman with a history of thyroid cancer and subsequent post surgical hypothyroidism, aortic and mitral valve repairs, hypertension, and hyperlipidemia who presents with a five-day history of right axillary pruritus, redness, pain, and pustular drainage. She believes that she initially was bit by a bug. She has tried cortisone cream, peroxide, and alcohol on the lesion with no improvement. Because of the progressive nature of the lesion she presented to the emergency department for further evaluation. She admits to some fevers, shakes, and chills. She also notes some drainage in both of her eyes, especially when awakening in the morning. She has had similar infections in her other armpit but denies ever having such lesions in her groin.  When seen on rounds this morning she noted no significant change in her symptoms as of yet.  Physical Exam - key components related to admission:  Filed Vitals:   12/16/13 1520 12/16/13 2127 12/16/13 2230 12/17/13 0635  BP: 110/66  119/70 119/52  Pulse: 60  67 69  Temp:   99.4 F (37.4 C) 99.5 F (37.5 C)  TempSrc:   Oral Oral  Resp: 19  18 17   Weight:    194 lb 14.4 oz (88.406 kg)  SpO2: 98% 99% 95% 96%   General: Well-developed, well-nourished, obese woman lying comfortably in bed in no acute distress. Lungs: Clear to auscultation bilaterally without wheezes, rhonchi, or rales. Chest wall: Linear keloid over median sternotomy scar. Heart: Regular rate and rhythm with a 2/6 crescendo decrescendo murmur  best heard in the left and right upper sternal borders and a 2/6 blowing systolic murmur at the left apex. These murmurs would be consistent with aortic stenosis and mitral regurgitation. Abdomen: Obese, soft, nontender, active bowel sounds, without guarding or rebound. Extremities: Without edema. Skin: Right axillary pustular folliculitis with surrounding erythema, warmth, and tenderness. The pustular lesions are linear as in an auto inoculated during scratching. There is also macerated skin in the inferior portion of the right axilla. 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  Lab results:  Basic Metabolic Panel:  Recent Labs  12/16/13 1243  NA 134*  K 4.5  CL 99  CO2 19  GLUCOSE 94  BUN 8  CREATININE 0.81  CALCIUM 8.3*   CBC:  Recent Labs  12/16/13 1335  WBC 10.2  NEUTROABS 8.3*  HGB 12.2  HCT 37.2  MCV 87.1  PLT 153   CBG:  Recent Labs  12/16/13 1146  GLUCAP 102*   Hemoglobin A1C:  Recent Labs  12/16/13 1336  HGBA1C 6.0*   Misc. Labs:  Blood culture x2 pending  HIV antibody nonreactive  Imaging results:  [place] Extrem Up Right Ltd  12/16/2013   CLINICAL DATA:  RIGHT axillary cellulitis question abscess  EXAM: ULTRASOUND RIGHT UPPER EXTREMITY LIMITED  TECHNIQUE: Ultrasound examination of the upper extremity soft tissues was performed in the area of clinical concern.  COMPARISON:  None  FINDINGS: Sonography performed at the site  of clinical concern at the RIGHT axilla.  Minimal scattered subcutaneous edema at site.  Visualized soft tissues are predominately fat lobules which are grossly normal in appearance.  Single questionable small developing fluid collection is seen approximately 15 x 5 x 6 mm in size though this is not completely well defined.  No other definite focal fluid collections are identified.  IMPRESSION: Scattered edema  with question small developing fluid collection approximately 15 x 5 x 6 mm in size which could potentially represent a developing tiny abscess collection.  No other definite defined fluid collections identified.   Electronically Signed   By: Lavonia Dana M.D.   On: 12/16/2013 17:29   Assessment & Plan by Problem:  [name] is a 53 year old woman with a history of thyroid cancer and subsequent postoperative hypothyroidism, hypertension, hyperlipidemia, who is status post aortic and mitral valve repairs who presents with a pustular folliculitis of the right axilla with an associated cellulitis and possible early abscess. The cause of the initial skin break is unclear but given the pustular nature of the lesion we are most concerned about a staph infection. The differential includes a contact dermatitis related to the peroxide and alcohol which she used, or hydradenitis suppurativa given the similar infection in the left axilla with mild scarring. Although she may have a small abscess developing in the right axilla we are hesitant to do an incision and drainage on such a small lesion given her propensity to form keloids.  1) Pustular folliculitis with cellulitis and early abscess: We will provide local wound care with an attempt to keep the area dry and clean. We are treating the infection with IV vancomycin. We will follow clinically and once improved will switch to an oral regimen. Given the likely staph infection I suspect we will use clindamycin as an outpatient to complete the course. We will educate her on the importance of loosefitting clothing and keeping her skin folds dry.  2) Disposition: We will transition her to an oral antibiotic regimen once there is improvement in her cellulitis. I suspect this may be in the next 48-72 hours.

## 2013-12-17 NOTE — Progress Notes (Signed)
Subjective: Patient feels about the same this morning, but is not in much discomfort. Both her right armpit and her eyes are still bothering her somewhat.   Objective: Vital signs in last 24 hours: Filed Vitals:   12/16/13 2127 12/16/13 2230 12/17/13 0635 12/17/13 1431  BP:  119/70 119/52 95/51  Pulse:  67 69 63  Temp:  99.4 F (37.4 C) 99.5 F (37.5 C) 98.6 F (37 C)  TempSrc:  Oral Oral Oral  Resp:  18 17 18   Height:   4\' 9"  (1.448 m)   Weight:   194 lb 14.4 oz (88.406 kg)   SpO2: 99% 95% 96% 96%   Physical Exam:  Appearance: pleasant woman lying in dark room, she has her right arm bent over her head, states that this is most comfortable position for her right now HEENT: decreased amount of thrush covering tongue, poor dentition (most teeth are missing), conjunctival injection OU, slightly less in left eye today, full motility OU, equally reactive pupils with no RAPD, no lid edema or ptosis, no preauricular lymphadeopathy, no clavicular lymphadenopathy but some tenderness to palpation of right mid-clavicular area. Acanthosis nigricans at base of posterior neck  Heart: holosystolic blowing murmur best appreciated at left apex, and crescendo/decrescendo murmur best heard at the left and right upper sternal borders, regular rate and rhythm, no rubs or gallops, significant keloid scar formation at site of prior sternotomy  Lungs: CTAB, no wheezes, rhonchi or rales Abdomen: obese abdomen, +BS, no tenderness to palpation, difficult to assess for hepatosplenomegaly  Skin: Right axillary pustular folliculitis with some draining and surrounding area of erythema, slightly regressed today. Pustular lesions are linear, as if autoinnoculated.  Some residual swelling tracking along the patient's stretch marks, distal to the main area of interest. No odor. 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 Neurologic: A&Ox3, sensation  and strength grossly intact  Extremities: no lower extremity edema, palpable pulses  Psychiatric: does not appear depressed  From day of admission, right axilla 12/16/13:     Lab Results: Basic Metabolic Panel:  Recent Labs Lab 12/16/13 1243  NA 134*  K 4.5  CL 99  CO2 19  GLUCOSE 94  BUN 8  CREATININE 0.81  CALCIUM 8.3*   CBC:  Recent Labs Lab 12/16/13 1335  WBC 10.2  NEUTROABS 8.3*  HGB 12.2  HCT 37.2  MCV 87.1  PLT 153   CBG:  Recent Labs Lab 12/16/13 1146  GLUCAP 102*   Hemoglobin A1C:  Recent Labs Lab 12/16/13 1336  HGBA1C 6.0*   Urine Drug Screen: Drugs of Abuse     Component Value Date/Time   LABOPIA NONE DETECTED 12/10/2007 0454   COCAINSCRNUR NONE DETECTED 12/10/2007 0454   LABBENZ NONE DETECTED 12/10/2007 0454   AMPHETMU NONE DETECTED 12/10/2007 0454   THCU NONE DETECTED 12/10/2007 0454   LABBARB  Value: NONE DETECTED        DRUG SCREEN FOR MEDICAL PURPOSES ONLY.  IF CONFIRMATION IS NEEDED FOR ANY PURPOSE, NOTIFY LAB WITHIN 5 DAYS. 12/10/2007 0454    Blood cultures pending x2: no growth to date  Wound culture: RARE WBC PRESENT, PREDOMINANTLY MONONUCLEAR RARE SQUAMOUS EPITHELIAL CELLS PRESENT ABUNDANT GRAM POSITIVE COCCI IN PAIRS IN CLUSTERS FEW GRAM POSITIVE RODS FEW GRAM NEGATIVE RODS  HIV nonreactive  HgbA1c: 6.0%  Studies/Results:  Right UE ultrasound 12/16/13: Scattered edema with question small developing fluid collection approximately 15 x 5 x 6  mm in size which could potentially represent a developing tiny abscess collection. No other definite defined fluid collections identified.  Medications: I have reviewed the patient's current medications. Scheduled Meds: . carvedilol  12.5 mg Oral BID WC  . citalopram  20 mg Oral Daily  . enoxaparin (LOVENOX) injection  40 mg Subcutaneous Q24H  . fluconazole  100 mg Oral Daily  . fluticasone  2 puff Inhalation BID  . furosemide  20 mg Oral BID  . levothyroxine  100 mcg Oral QAC breakfast    . lisinopril  10 mg Oral Daily  . trimethoprim-polymyxin b  2 drop Both Eyes 6 times per day  . vancomycin  1,500 mg Intravenous Q24H   Continuous Infusions:  PRN Meds:.acetaminophen, acetaminophen, albuterol, traMADol Assessment/Plan: Principal Problem:   Cellulitis of axillary region Active Problems:   Conjunctivitis of both eyes   Cardiomyopathy    S/P aortic valve repair   S/P MVR (mitral valve repair)   HTN (hypertension)   Hyperlipidemia   Hypothyroidism   Hx of thyroid cancer  This 53 year old woman has a medical history of hypertension, hyperlipidemia, cardiac surgery (repair of both aortic and mitral valves), thyroid cancer and subsequent postoperative hypothyroidism. She also has a history of "boils" in her left axilla, and presented to the ED yesterday with right axillary cellulitis with open pustules and bacterial conjunctivitis in both eyes.   Pustular folliculitis with cellulitis and early abscess: The patient's wound culture came back showing gram positive cocci in clusters and pain and gram positive and negative rods; the culture was likely contaminated. She likely has a staph infection, complicated by follicular pustules. Her US showed an early abscess; it is likely too small to warrant an I&D, but we will monitor the patient's response to antibiotics. - continuing vancomycin IV treatment; plan to switch to clindamycin PO once improving - will begin teaching on keeping skin folds clean and dry, and wearing loose-fitting clothing - Hydromorphone for pain control along with home tramadol  - Contact isolation - transition to oral antibiotics once there is improvement in her symptoms  Bacterial conjunctivitis: Conjunctival erythema is likely autoinnoculated bacterial conjunctivitis that spread from her axillary infection through the patient's own contact with her eyes (she admits to touching her eyes over the past few days). Looks slightly better today. -  trimethoprim-polymyxin B gtts initiated (2 drops OU q4 hours) - warm compresses for symptomatic relief   History of valve repairs (mitral and aortic): Does not need anticoagulation, as there were no valve replacements; this history does not appear to be related to current presentation.  Hypertension: patient had blood pressure drops to the 90s/50s after morphine administration, but was otherwise normotensive today - continue home carvedilol and furosemide  Hypothyroidism s/p thyroid cancer: need to clarify history  - home dose of levothyroxine   Thrush: subsiding with fluconazole; likely occurred due to inhaler use  Depression:  - resume home citalopram   Diet: HH   DVT PPx: lovenox  Dispo: Disposition is deferred at this time, awaiting improvement of current medical problems.  Anticipated discharge in approximately 2 day(s).   The patient does have a current PCP Aura Dials, PA-C) and does not need an [facility] hospital follow-up appointment after discharge.  The patient does not have transportation limitations that hinder transportation to clinic appointments.  .Services Needed at time of discharge: Y = Yes, Blank = No PT:   OT:   RN:   Equipment:   Other:  LOS: 1 day   Drucilla Schmidt, MD 12/17/2013, 2:41 PM

## 2013-12-17 NOTE — Progress Notes (Signed)
Internal Medicine Attending  Date: 12/17/2013  Patient name: [name] Medical record number: [id number] Date of birth: 1961/02/09 Age: 53 y.o. Gender: female  I saw and evaluated the patient. I developed the assessment and plan with the housestaff and reviewed the resident's note by Dr. Sherrine Maples.  I agree with the resident's findings and plans as documented in her progress note.  Please see my addendum to the history and physical for my evaluation, assessment, and plan from today.

## 2013-12-18 DIAGNOSIS — M542 Cervicalgia: Secondary | ICD-10-CM

## 2013-12-18 MED ORDER — CYCLOBENZAPRINE HCL 5 MG PO TABS
5.0000 mg | ORAL_TABLET | Freq: Every day | ORAL | Status: DC
  Administered 2013-12-18 – 2013-12-19 (×2): 5 mg via ORAL
  Filled 2013-12-18 (×3): qty 1

## 2013-12-18 MED ORDER — NYSTATIN 100000 UNIT/ML MT SUSP
5.0000 mL | OROMUCOSAL | Status: AC
  Administered 2013-12-18: 500000 [IU] via ORAL
  Filled 2013-12-18: qty 5

## 2013-12-18 NOTE — Progress Notes (Signed)
Subjective: Patient feels "ok" this morning. She is experiencing some pain in her neck that extends to her skull base. She states that it is worst when she moves her head side to side (as in the "no" motion); she also says that she has experienced this pain, less exaggerated, in the past in the same location. Both her right armpit and her eyes are still bothering her; she thinks her eyes might be slightly better.  Objective: Vital signs in last 24 hours: Filed Vitals:   12/17/13 2137 12/18/13 0500 12/18/13 0632 12/18/13 0847  BP: 138/75  113/52   Pulse: 70  61   Temp: 98.8 F (37.1 C)  98 F (36.7 C)   TempSrc: Oral  Oral   Resp: 18  20   Height:      Weight:  194 lb (87.998 kg)    SpO2: 98%  95% 93%   Physical Exam:  Appearance: pleasant woman lying in dark room, daughter at bedside, arm is resting at her side HEENT: persistent thrush covering tongue, poor dentition (most teeth are missing), conjunctival injection OU, slightly improved though patient initially had some difficulty opening eyes fully due to discharge, full motility OU, equally reactive pupils with no RAPD, no lid edema or ptosis, no preauricular lymphadeopathy, small, nontender mandibular lymph nodes palpable, no clavicular lymphadenopathy. Acanthosis nigricans at base of posterior neck  Heart: holosystolic blowing murmur best appreciated at left apex, and crescendo/decrescendo murmur best heard at the left and right upper sternal borders, regular rate and rhythm, no rubs or gallops, significant keloid scar formation at site of prior sternotomy  Lungs: CTAB, no wheezes, rhonchi or rales Abdomen: obese abdomen, +BS, no tenderness to palpation, difficult to assess for hepatosplenomegaly  Skin: Right axillary pustular folliculitis with some draining and surrounding area of erythema which is again slightly regressed today. Pustular lesions are linear, as if autoinnoculated and have mostly opened at this point. Some residual  swelling tracking along the patient's stretch marks, distal to the main area of interest. No odor. 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 Neurologic: A&Ox3, sensation and strength grossly intact  Extremities: no lower extremity edema, palpable pulses  Psychiatric: does not appear depressed  From day of admission, right axilla 12/16/13:  After 2 days of treatment 7/29:    Lab Results: Basic Metabolic Panel:  Recent Labs Lab 12/16/13 1243  NA 134*  K 4.5  CL 99  CO2 19  GLUCOSE 94  BUN 8  CREATININE 0.81  CALCIUM 8.3*   CBC:  Recent Labs Lab 12/16/13 1335  WBC 10.2  NEUTROABS 8.3*  HGB 12.2  HCT 37.2  MCV 87.1  PLT 153   CBG:  Recent Labs Lab 12/16/13 1146  GLUCAP 102*   Hemoglobin A1C:  Recent Labs Lab 12/16/13 1336  HGBA1C 6.0*   Urine Drug Screen: Drugs of Abuse     Component Value Date/Time   LABOPIA NONE DETECTED 12/10/2007 0454   COCAINSCRNUR NONE DETECTED 12/10/2007 0454   LABBENZ NONE DETECTED 12/10/2007 0454   AMPHETMU NONE DETECTED 12/10/2007 0454   THCU NONE DETECTED 12/10/2007 0454   LABBARB  Value: NONE DETECTED        DRUG SCREEN FOR MEDICAL PURPOSES ONLY.  IF CONFIRMATION IS NEEDED FOR ANY PURPOSE, NOTIFY LAB WITHIN 5 DAYS. 12/10/2007 0454    Blood cultures pending x2: no growth to date  Wound culture: RARE WBC PRESENT, PREDOMINANTLY MONONUCLEAR  RARE SQUAMOUS EPITHELIAL CELLS PRESENT ABUNDANT GRAM POSITIVE COCCI IN PAIRS IN CLUSTERS FEW GRAM POSITIVE RODS FEW GRAM NEGATIVE RODS  HIV nonreactive  HgbA1c: 6.0%  Studies/Results:  Right UE ultrasound 12/16/13: Scattered edema with question small developing fluid collection approximately 15 x 5 x 6 mm in size which could potentially represent a developing tiny abscess collection. No other definite defined fluid collections identified.  Medications: I have reviewed the patient's current medications. Scheduled  Meds: . carvedilol  12.5 mg Oral BID WC  . citalopram  20 mg Oral Daily  . enoxaparin (LOVENOX) injection  40 mg Subcutaneous Q24H  . fluconazole  100 mg Oral Daily  . fluticasone  2 puff Inhalation BID  . furosemide  20 mg Oral BID  . levothyroxine  100 mcg Oral QAC breakfast  . lisinopril  10 mg Oral Daily  . trimethoprim-polymyxin b  2 drop Both Eyes 6 times per day  . vancomycin  1,500 mg Intravenous Q24H   Continuous Infusions:  PRN Meds:.acetaminophen, acetaminophen, albuterol, traMADol Assessment/Plan: Principal Problem:   Cellulitis of axillary region Active Problems:   Conjunctivitis of both eyes   Cardiomyopathy    S/P aortic valve repair   S/P MVR (mitral valve repair)   HTN (hypertension)   Hyperlipidemia   Hypothyroidism   Hx of thyroid cancer  This 53 year old woman has a medical history of hypertension, hyperlipidemia, cardiac surgery (repair of both aortic and mitral valves), thyroid cancer and subsequent postoperative hypothyroidism. She also has a history of "boils" in her left axilla and presented to the ED with right axillary cellulitis with open pustules and bacterial conjunctivitis in both eyes.   Pustular folliculitis with cellulitis and early abscess: The patient's wound culture came back showing gram positive cocci in clusters and pairs and gram positive and negative rods; the culture was likely contaminated. She likely has a staph infection, complicated by follicular pustules. Her US showed an early abscess; it is likely too small to warrant an I&D, but we will monitor the patient's response to antibiotics. - continuing vancomycin IV treatment; plan to switch to clindamycin PO once symptoms are improving - will begin teaching on keeping skin folds clean and dry, and wearing loose-fitting clothing; nursing to clean and re-dress today - Hydromorphone for pain control along with home tramadol  - Contact isolation  Bacterial conjunctivitis: Conjunctival  erythema is likely autoinnoculated bacterial conjunctivitis that spread from her axillary infection through the patient's own contact with her eyes (she admits to touching her eyes over the past few days). Looks slightly better today. - trimethoprim-polymyxin B gtts initiated (2 drops OU q4 hours) - warm compresses for symptomatic relief   Neck Pain: new today - starting heating pad and flexeril 5 mg   History of valve repairs (mitral and aortic): Does not need anticoagulation, as there were no valve replacements; this history does not appear to be related to current presentation.  Hypertension: reported hypertension, but patient had blood pressure drops to the 90s/50s in last 24 hours. She was otherwise normotensive today. Patient has remained asymptomatic (in terms of hypotension) throughout. - continue home carvedilol and furosemide  Hypothyroidism s/p thyroid cancer: need to clarify history  - home dose of levothyroxine   Thrush: slightly worse again today, even with fluconazole; likely due to patient's chronic inhaler use (has rx for inhalers from previous episode of bronchitis) - adding nystatin swish and swallow; can consider writing rx for patient at d/c since this is a chronic problem  Depression:  - resume home citalopram   Diet: HH   DVT PPx: lovenox  Dispo: Disposition is deferred at this time, awaiting improvement of current medical problems.  Anticipated discharge in approximately 2 day(s).   The patient does have a current PCP Aura Dials, PA-C) and does not need an [facility] hospital follow-up appointment after discharge.  The patient does not have transportation limitations that hinder transportation to clinic appointments.  .Services Needed at time of discharge: Y = Yes, Blank = No PT:   OT:   RN:   Equipment:   Other:     LOS: 2 days   Drucilla Schmidt, MD 12/18/2013, 2:17 PM

## 2013-12-18 NOTE — Progress Notes (Signed)
Assessment/Plan:  Principal Problem   Pustular cellulitis of the right axilla Active Problems   Conjunctivitis   Oral Thrush   Hypertension   CHF s/t Takotsubo cardiomyopathy s/p mitral/aortic valve repair   Hypothyroidism   Depression    [name] is a 53yo female with a past medical history of recurrent "boils" in each axilla, CHF s/p mitral and aortic valve repair, obesity, and hypertension that presents with pustular cellulitis of the right axilla.  Pustular cellulitis of the right axilla Differential includes bacterial infection (CA-MRSA vs Strep) vs hydradenitis suppurativa, and also contact dermatitis. Erythema showed some improvement today, will start marking outline to better gauge. Pustules still draining, and wound is starting to open up. Neck pain was somewhat concerning for meningitis, but physical exam was not conclusive or dramatic enough for diagnostic measures. Will order a heating pad for neck pain and monitor patient status. -- Continue Vancomycin IV -- Follow erythema with markings -- Have nursing clean wounds in addition to dressing -- Follow temperature for signs of worsening infection -- Give heating pad for neck pain -- Follow neck pain  Conjunctivitis Patient still has significant conjunctivitis, likely due to self-inoculation from axillary wound. Instruct patient to continue using hot compresses, not rubbing the eyes, and using the anti-bacterial eye drops she was prescribed. -- Continue TMP-SMX eye drops -- Continue hot compresses PRN  Thrush  Patient has no teeth after pre-operative procedure for her open heart surgery in 2010 led to their extraction. She has also been using a fluticasone inhaler long term for chronic bronchitis, which likely led to the development of oral thrush. It has only shown slight improvement with oral fluconazole since admission, so a swish and swallow has been ordered. -- Start dose of Nystatin swish and swallow --  Continue fluconazole 100 mg PO  Hypertension -- Continue home dose of lisinopril 10 mg  CHF Last Echo performed in April. Make sure that the patient is following up with a cardiologist regularly. -- Continue home dose of carvedilol 12.5 mg  Shortness of breath -- Continue home dose of fluticasone  Hypothyroidism Patient has iatrogenic hypothyroidism s/p thyroid operation for cancer -- Continue home dose levothyroxine 100 g  Depression -- Continue home dose of citalopram 20 mg  DVT Prophylaxis -- Continue enoxaparin 40 mg  Subjective:   [name], but said that she was able to sleep well. She woke up with neck pain that is worst when rotating her head horizontally. She is more easily able to nod her head in the vertical plane, though this still elicits some pain. She has no fevers, chills, SOB, chest pain, or abdominal pain.  Objective:  Vitals T 11F  RR 20  P 61  BP 113/52 - 138/75  O2 95  Physical Exam General: Asleep, lying in bed, daughter is present  HEENT: Conjunctivitis seems slightly improved in the left eye, but largely the same as on admission. Ritta Slot has shown some improvement Tender lymphadenopathy under the jaw, and jaw pain on opening of the mouth.   Cardiovascular: Significant respiratory variation, nl S1/S2, 4/6 crescendo/decrescendo murmur best heard at the left sternal border, 2/6 holosytolic blowing murmur at the left 5th intercostal space   Pulmonary: Normal work of breathing, with no wheezes or crackles to auscultation  Abdomen: Soft, non-tender, non-distended, with normoactive bowel sounds  MSK: Severe pain on rotation of the head horizontally. Pain on nodding of the head vertically. Pain not elicited by palpation.  Skin: Excoriation and maceration of the skin, with improving cellulitis, and destruction of the pustules. Large keloid scar over sternotomy incision, small scar located posteriorly  in the left axilla (appears similar to [facility] Stage 1)  Labs CBC Hgb 12.2 12/16/13 Hct 37.2 12/16/13 WBC 10.2 (80% Neutrophils)     12/16/13 Plt 153 12/16/13  BMP Na  134 12/16/13 K 4.5 12/16/13 Cl 99 12/16/13 CO2 19 12/16/13 BUN 8 12/16/13 Cr 0.81 12/16/13 Glu 126 12/16/13  Others Ca 8.3 12/16/13 A1C 6.0 12/16/13 HIV Nonreactive 12/16/13  Studies Wound culture still pending

## 2013-12-18 NOTE — Progress Notes (Signed)
Internal Medicine Attending  Date: 12/18/2013  Patient name: [name] Medical record number: [id number] Date of birth: [birth year] Age: 53 y.o. Gender: female  I saw and evaluated the patient. I developed the assessment and plan with the housestaff and reviewed the resident's note by Dr. Sherrine Maples.  I agree with the resident's findings and plans as documented in her progress note.  Ms. Salvador developed some neck stiffness overnight. She has some muscular tenderness on examination. Her right axillary area appears slightly improved with unroofing of most of the pustular lesions at this point. We will continue the IV vancomycin for the presumed staph infection.

## 2013-12-18 NOTE — Progress Notes (Signed)
Dear Doctor: This patient has been identified as a candidate for PICC for the following reason (s): drug pH or osmolality (causing phlebitis, infiltration in 24 hours) If you agree, please write an order for the indicated device. For any questions contact the Vascular Access Team at [phone] if no answer, please leave a message. This pt has had a PIV everyday since admission.   Thank you for supporting the early vascular access assessment program. Catalina Pizza

## 2013-12-19 LAB — WOUND CULTURE

## 2013-12-19 MED ORDER — SULFAMETHOXAZOLE-TMP DS 800-160 MG PO TABS: 1.0000 | ORAL_TABLET | Freq: Two times a day (BID) | ORAL | Status: DC

## 2013-12-19 MED ORDER — SULFAMETHOXAZOLE-TMP DS 800-160 MG PO TABS
1.0000 | ORAL_TABLET | Freq: Two times a day (BID) | ORAL | Status: DC
  Administered 2013-12-19: 1 via ORAL
  Filled 2013-12-19: qty 1

## 2013-12-19 MED ORDER — POLYMYXIN B-TRIMETHOPRIM 10000-0.1 UNIT/ML-% OP SOLN: 2.0000 [drp] | Freq: Four times a day (QID) | OPHTHALMIC | Status: DC

## 2013-12-19 MED ORDER — CYCLOBENZAPRINE HCL 5 MG PO TABS: 5.0000 mg | ORAL_TABLET | Freq: Three times a day (TID) | ORAL | Status: DC | PRN

## 2013-12-19 NOTE — Progress Notes (Signed)
  I have seen and examined the patient myself, and I have reviewed the note by Garret Reddish, MS 3 and was present during the interview and physical exam.  Please see my separate H&P for additional findings, assessment, and plan.   Signed: Drucilla Schmidt, MD 12/19/2013, 4:12 PM

## 2013-12-19 NOTE — Progress Notes (Signed)
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  Hospital Day #4   Pustular cellulitis of the right axilla Wound culture came back with abundant staph aureus growth confirming our differential diagnosis. As the patient's wound has made great progress, and the patient is feeling better, we are going to switch her to bactrim DS PO, with a discharge goal of 7/30.  -- Switch vancomycin IV --> bactrim DS PO -- Have nursing clean wounds in addition to dressing -- Follow up with PCP in 1 week  Conjunctivitis As this has been showing improvement with antibiotic eye drops, continue this on outpatient. -- TMP-polymyxin b opthalmic solution, 2 drops in each eye every 6 hours  Wynona Canes has shown slow improvement with fluconazole 100 mg PO and nystatin swish and swallow. -- Continue fluconazole 100 mg PO at home  Neck Pain Patient's neck pain has been worsening, but she shows not photophobia, or fevers, and is functionally improved with walking around the halls yesterday. As flexeril 5mg  and heating pad have improved pain, and it is similar to pain she has had in the past, will prescribe a home dose of flexeril PRN for the pain. -- Continue flexeril 5 mg TID PRN at home -- Continue home dose tramadol 50 mg PRN  Hypertension  -- Continue home dose of lisinopril 10 mg   CHF  -- Continue home dose of carvedilol 12.5 mg -- Continue home dose of furosemide 20 mg   Shortness of breath  -- Continue home dose of fluticasone   Hypothyroidism  Patient has iatrogenic hypothyroidism s/p  thyroid operation for cancer  -- Continue home dose levothyroxine 100 g   Depression  -- Continue home dose of citalopram 20 mg   DVT Prophylaxis  -- Continue enoxaparin 40 mg  Dispo:  -- Goal 7/30 to home -- Follow-up Aug. 6th, 2:30 pm with Landry Mellow PA-C,  Code Status: Full Code _____________________________________________________________________  Subjective:  Yesterday, patient walked the halls some and asked if she could even leave the unit to walk  Overnight, the patient slept well. There were no acute events.  This morning, neck pain has worsened with restriction in the horizontal plane especially. She states that flexeril and heating pad significantly helped the pain and it is similar to neck pain she has had in the past. She also experienced some night chills last night. However she had no fevers or photophobia, and she appears functionally improved on exam. She also had no SOB, chest pain, or abdominal pain.   Objective:   Vitals  Temp:  [98.2 F (36.8 C)-98.6 F (37 C)] 98.2 F (36.8 C) (07/30 0621) Pulse Rate:  [58-61] 58 (07/30 0621) Resp:  [16-20] 16 (07/30 0621) BP: (104-119)/(42-53) 119/53 mmHg (07/30 0621) SpO2:  [95 %-96 %] 96 % (07/30 0621) Weight:  [89.6 kg (197 lb 8.5 oz)] 89.6 kg (197 lb 8.5 oz) (07/30 4696)  Physical Exam  General: Asleep, lying in bed, daughter is present   HEENT: Conjunctivitis seems slightly improved in the left eye, but largely the same as on admission. Ritta Slot has shown some improvement. Tender lymphadenopathy  under the jaw, and jaw pain on opening of the mouth.   Cardiovascular: Significant respiratory variation, nl S1/S2, 4/6 crescendo/decrescendo murmur best heard at the left sternal border, 2/6 holosytolic blowing murmur at the left 5th intercostal space.  Pulmonary: Normal work of breathing, with no wheezes or crackles to auscultation   Abdomen: Soft, non-tender, non-distended, with normoactive bowel sounds   MSK:  Worsened pain on rotation of the head horizontally. Pain on nodding of the head vertically. Pain not elicited by palpation.   Skin: Axillary erythema has shown some improvement. Pustules have all burst and the wound is no longer draining. Large keloid scar over sternotomy incision, small scar located posteriorly in the left axilla (appears similar to [facility] Stage 1)  Labs  CBC    Component Value Date/Time   WBC 10.2 12/16/2013 1335   RBC 4.27 12/16/2013 1335   HGB 12.2 12/16/2013 1335   HCT 37.2 12/16/2013 1335   PLT 153 12/16/2013 1335   MCV 87.1 12/16/2013 1335   MCH 28.6 12/16/2013 1335   MCHC 32.8 12/16/2013 1335   RDW 14.6 12/16/2013 1335   LYMPHSABS 1.1 12/16/2013 1335   MONOABS 0.7 12/16/2013 1335   EOSABS 0.2 12/16/2013 1335   BASOSABS 0.0 12/16/2013 1335   CMP     Component Value Date/Time   NA 134* 12/16/2013 1243   K 4.5 12/16/2013 1243   CL 99 12/16/2013 1243   CO2 19 12/16/2013 1243   GLUCOSE 94 12/16/2013 1243   BUN 8 12/16/2013 1243   CREATININE 0.81 12/16/2013 1243   CALCIUM 8.3* 12/16/2013 1243   GFRNONAA 82* 12/16/2013 1243   GFRAA >90 12/16/2013 1243   Studies  Wound culture: Abundant staph aureus growth Blood culture: No growth to date

## 2013-12-19 NOTE — Progress Notes (Signed)
Internal Medicine Attending  Date: 12/19/2013  Patient name: [name] Medical record number: [id number] Date of birth: 06/14/1960 Age: 53 y.o. Gender: female  I saw and evaluated the patient. I developed the assessment and plan with the housestaff and reviewed the resident's note by Dr. Sherrine Hale.  I agree with the resident's findings and plans as documented in her progress note.  On rounds this morning [name] was feeling better. She states that her right axillary pain was improved and has been carrying her arm down, which was not the case on admission. Examination reveals unroofing of all of the pustules with some regression of the erythema. She is on oral Bactrim at this time for her presumed community-acquired methicillin-resistant staph aureus infection and is stable for discharge home today. She will be followup in her primary care provider's office.

## 2013-12-19 NOTE — Discharge Instructions (Signed)
Please continue to keep your right armpit clean and bandaged. Take your antibiotics through the full course, even if the area is getting better. Take your eye drops as prescribed (2 drops in each eye every 6 hours) for the next 4 days. If either problem area is not getting better, your primary care doctor can help new treatment or a visit to the eye doctor if necessary during your appointment on August 6th. If your symptoms are getting worse, you should seek medical care.  Cellulitis Cellulitis is an infection of the skin and the tissue under the skin. The infected area is usually red and tender. This happens most often in the arms and lower legs. HOME CARE   Take your antibiotic medicine as told. Finish the medicine even if you start to feel better.  Keep the infected arm or leg raised (elevated).  Put a warm cloth on the area up to 4 times per day.  Only take medicines as told by your doctor.  Keep all doctor visits as told. GET HELP IF:  You see red streaks on the skin coming from the infected area.  Your red area gets bigger or turns a dark color.  Your bone or joint under the infected area is painful after the skin heals.  Your infection comes back in the same area or different area.  You have a puffy (swollen) bump in the infected area.  You have new symptoms.  You have a fever. GET HELP RIGHT AWAY IF:   You feel very sleepy.  You throw up (vomit) or have watery poop (diarrhea).  You feel sick and have muscle aches and pains. MAKE SURE YOU:   Understand these instructions.  Will watch your condition.  Will get help right away if you are not doing well or get worse. Document Released: 10/26/2007 Document Revised: 09/23/2013 Document Reviewed: 07/25/2011 [facility] Patient Information 2015 [place], [place]. This information is not intended to replace advice given to you by your health care provider. Make sure you discuss any questions you have with your health care  provider.   Abscess An abscess (boil or furuncle) is an infected area on or under the skin. This area is filled with yellowish-white fluid (pus) and other material (debris). HOME CARE   Only take medicines as told by your doctor.  If you were given antibiotic medicine, take it as directed. Finish the medicine even if you start to feel better.  If gauze is used, follow your doctor's directions for changing the gauze.  To avoid spreading the infection:  Keep your abscess covered with a bandage.  Wash your hands well.  Do not share personal care items, towels, or whirlpools with others.  Avoid skin contact with others.  Keep your skin and clothes clean around the abscess.  Keep all doctor visits as told. GET HELP RIGHT AWAY IF:   You have more pain, puffiness (swelling), or redness in the wound site.  You have more fluid or blood coming from the wound site.  You have muscle aches, chills, or you feel sick.  You have a fever. MAKE SURE YOU:   Understand these instructions.  Will watch your condition.  Will get help right away if you are not doing well or get worse. Document Released: 10/26/2007 Document Revised: 11/08/2011 Document Reviewed: 07/22/2011 [facility] Patient Information 2015 [place], [place]. This information is not intended to replace advice given to you by your health care provider. Make sure you discuss any questions you have with your  health care provider. ° °

## 2013-12-19 NOTE — Progress Notes (Signed)
  I have seen and examined the patient myself, and I have reviewed the note by Garret Reddish, MS 3 and was present during the interview and physical exam.  Please see my separate H&P for additional findings, assessment, and plan.   Signed: Drucilla Schmidt, MD 12/19/2013, 10:23 AM

## 2013-12-19 NOTE — Progress Notes (Signed)
Subjective: Patient feels good this morning, though her neck pain is still bothersome. She found both the heating pad and flexeril helpful yesterday. The pain continues to bother her most on side-to-side movement (as in shaking her head "no"), and it is similar but worse than neck pain she has experienced in the past. Her right armpit pain is lessening. She is not photophobic, but does have a hard time opening her eyes following sleep.   Objective: Vital signs in last 24 hours: Filed Vitals:   12/18/13 1508 12/18/13 1928 12/18/13 2235 12/19/13 0621  BP: 104/42  112/53 119/53  Pulse: 60  61 58  Temp: 98.6 F (37 C)  98.4 F (36.9 C) 98.2 F (36.8 C)  TempSrc: Oral  Oral Oral  Resp: 18  20 16   Height:      Weight:    197 lb 8.5 oz (89.6 kg)  SpO2: 95% 95% 95% 96%   Physical Exam:  Appearance: asleep in dark room, daughter at bedside, arm is resting comfortably at her side HEENT: persistent thrush covering tongue, poor dentition (most teeth are missing), conjunctival injection OU with significant drainage present OU, patient had some difficulty opening eyes fully due to discharge (she had just woken), full motility OU, equally reactive pupils with no RAPD, no lid edema or ptosis, no preauricular lymphadeopathy, small, nontender mandibular lymph nodes palpable, no clavicular lymphadenopathy Neck: slight pain to palpation, particularly at base of skull, pain on rotation of head in "no" motion; less pain in up and down motion Heart: holosystolic blowing murmur best appreciated at left apex, and crescendo/decrescendo murmur best heard at the left and right upper sternal borders, regular rate and rhythm, no rubs or gallops, significant keloid scar formation at site of prior sternotomy  Lungs: CTAB, no wheezes, rhonchi or rales Abdomen: obese abdomen, +BS, no tenderness to palpation, difficult to assess for hepatosplenomegaly  Skin: Right axillary pustular folliculitis with residual but no new  draining; surrounding area of erythema is again slightly regressed today. Some residual swelling tracking along the patient's stretch marks, distal to the main area of interest. No odor. 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 Neurologic: A&Ox3, sensation and strength grossly intact  Extremities: no lower extremity edema, palpable pulses  Psychiatric: does not appear depressed  From day of admission, right axilla 12/16/13:  After 2 days of treatment 7/29:    Lab Results: Basic Metabolic Panel:  Recent Labs Lab 12/16/13 1243  NA 134*  K 4.5  CL 99  CO2 19  GLUCOSE 94  BUN 8  CREATININE 0.81  CALCIUM 8.3*   CBC:  Recent Labs Lab 12/16/13 1335  WBC 10.2  NEUTROABS 8.3*  HGB 12.2  HCT 37.2  MCV 87.1  PLT 153   CBG:  Recent Labs Lab 12/16/13 1146  GLUCAP 102*   Hemoglobin A1C:  Recent Labs Lab 12/16/13 1336  HGBA1C 6.0*   Urine Drug Screen: Drugs of Abuse     Component Value Date/Time   LABOPIA NONE DETECTED 12/10/2007 0454   COCAINSCRNUR NONE DETECTED 12/10/2007 0454   LABBENZ NONE DETECTED 12/10/2007 0454   AMPHETMU NONE DETECTED 12/10/2007 0454   THCU NONE DETECTED 12/10/2007 0454   LABBARB  Value: NONE DETECTED        DRUG SCREEN FOR MEDICAL PURPOSES ONLY.  IF CONFIRMATION IS NEEDED FOR ANY PURPOSE, NOTIFY LAB WITHIN 5 DAYS. 12/10/2007 0454    Blood cultures x2:  NGTD  Wound culture: RARE WBC PRESENT, PREDOMINANTLY MONONUCLEAR RARE SQUAMOUS EPITHELIAL CELLS PRESENT ABUNDANT GRAM POSITIVE COCCI IN PAIRS IN CLUSTERS FEW GRAM POSITIVE RODS FEW GRAM NEGATIVE RODS  HIV nonreactive  HgbA1c: 6.0%  Studies/Results:  Right UE ultrasound 12/16/13: Scattered edema with question small developing fluid collection approximately 15 x 5 x 6 mm in size which could potentially represent a developing tiny abscess collection. No other definite defined fluid collections identified.  Medications: I  have reviewed the patient's current medications. Scheduled Meds: . carvedilol  12.5 mg Oral BID WC  . citalopram  20 mg Oral Daily  . cyclobenzaprine  5 mg Oral Daily  . enoxaparin (LOVENOX) injection  40 mg Subcutaneous Q24H  . fluconazole  100 mg Oral Daily  . fluticasone  2 puff Inhalation BID  . furosemide  20 mg Oral BID  . levothyroxine  100 mcg Oral QAC breakfast  . lisinopril  10 mg Oral Daily  . trimethoprim-polymyxin b  2 drop Both Eyes 6 times per day  . vancomycin  1,500 mg Intravenous Q24H   Continuous Infusions:  PRN Meds:.acetaminophen, acetaminophen, albuterol, traMADol Assessment/Plan: Principal Problem:   Cellulitis of axillary region Active Problems:   Conjunctivitis of both eyes   Cardiomyopathy    S/P aortic valve repair   S/P MVR (mitral valve repair)   HTN (hypertension)   Hyperlipidemia   Hypothyroidism   Hx of thyroid cancer  This 53 year old woman has a medical history of hypertension, hyperlipidemia, cardiac surgery (repair of both aortic and mitral valves), thyroid cancer and subsequent postoperative hypothyroidism. She has a history of "boils" in her left axilla and presented to the ED with right axillary cellulitis with open pustules and bacterial conjunctivitis in both eyes. While here, she has developed neck stiffness with muscular tenderness. All symptoms are showing improvement.  Pustular folliculitis with cellulitis and early abscess: The patient's wound culture came back showing gram positive cocci in clusters and pairs and gram positive and negative rods; the culture was likely contaminated. She likely has a staph infection, complicated by follicular pustules. Her US showed an early abscess that is too small to warrant an I&D. - switched to bactrim DS PO BID, as we move toward discharge - we have performed teaching on keeping skin folds clean and dry, and wearing loose-fitting clothing; nursing has been cleaning and re-dressing the wound -  Hydromorphone for pain control along with home tramadol  - Contact isolation  Bacterial conjunctivitis: Conjunctival erythema is likely autoinnoculated bacterial conjunctivitis that spread from her axillary infection through the patient's own contact with her eyes (she admits to touching her eyes over the past few days). Looks slightly better today. - trimethoprim-polymyxin B gtts initiated (2 drops OU q4 hours) - warm compresses for symptomatic relief   Neck Pain: pain to palpation on exam, pain worse on movement side-to-side; pain has been present since yesterday; improved with heating pad and flexeril - continue heating pad and flexeril 5 mg   History of valve repairs (mitral and aortic): Does not need anticoagulation, as there were no valve replacements; this history does not appear to be related to current presentation.  Hypertension: reported hypertension, but patient stable in the 100s/40s in last 24 hours. Patient's BP was hypotensive prior in admission, but she was never symptomatic. - continue home carvedilol and furosemide  Hypothyroidism s/p thyroid cancer:  - home dose of levothyroxine   Thrush: persists even with fluconazole and nystatin swish and swallow; likely due to patient's  chronic inhaler use (has rx for inhalers from previous episode of bronchitis)  Depression:  - resume home citalopram   Diet: HH   DVT PPx: lovenox  Dispo: Disposition is deferred at this time, awaiting improvement of current medical problems.  Anticipated discharge in approximately 0 day(s).   The patient does have a current PCP Aura Dials, PA-C) and does not need an [facility] hospital follow-up appointment after discharge.  The patient does not have transportation limitations that hinder transportation to clinic appointments.  .Services Needed at time of discharge: Y = Yes, Blank = No PT:   OT:   RN:   Equipment:   Other:     LOS: 3 days   Drucilla Schmidt, MD 12/19/2013, 11:08 AM

## 2013-12-19 NOTE — Discharge Summary (Signed)
Name: [name] MRN: [id number] DOB: 09/26/1960 53 y.o. PCP: Aura Dials, PA-C  Date of Admission: 12/16/2013 10:52 AM Date of Discharge: 12/19/2013 Attending Physician: Karren Cobble, MD  Discharge Diagnosis: Principal Problem:   Cellulitis of axillary region Active Problems:   Conjunctivitis of both eyes   Cardiomyopathy    S/P aortic valve repair   S/P MVR (mitral valve repair)   HTN (hypertension)   Hyperlipidemia   Hypothyroidism   Hx of thyroid cancer  Discharge Medications:   Medication List         albuterol 108 (90 BASE) MCG/ACT inhaler  Commonly known as:  PROVENTIL HFA;VENTOLIN HFA  Inhale 2 puffs into the lungs 2 (two) times daily. Takes for shortness of breath     beclomethasone 40 MCG/ACT inhaler  Commonly known as:  QVAR  Inhale 2 puffs into the lungs daily.     carvedilol 12.5 MG tablet  Commonly known as:  COREG  Take 12.5 mg by mouth 2 (two) times daily with a meal.     citalopram 20 MG tablet  Commonly known as:  CELEXA  Take 20 mg by mouth daily.     cyclobenzaprine 5 MG tablet  Commonly known as:  FLEXERIL  Take 1 tablet (5 mg total) by mouth 3 (three) times daily as needed for muscle spasms (take for neck pain as needed).     furosemide 20 MG tablet  Commonly known as:  LASIX  Take 20 mg by mouth 2 (two) times daily.     levothyroxine 100 MCG tablet  Commonly known as:  SYNTHROID, LEVOTHROID  Take 100 mcg by mouth daily.     lisinopril 10 MG tablet  Commonly known as:  PRINIVIL,ZESTRIL  Take 10 mg by mouth daily.     sulfamethoxazole-trimethoprim 800-160 MG per tablet  Commonly known as:  BACTRIM DS  Take 1 tablet by mouth every 12 (twelve) hours.     traMADol 50 MG tablet  Commonly known as:  ULTRAM  Take 50 mg by mouth every 6 (six) hours as needed for moderate pain.     trimethoprim-polymyxin b ophthalmic solution  Commonly known as:  POLYTRIM  Place 2 drops into both eyes every 6 (six) hours.        Disposition and follow-up:   Ms.Crystalyn L Dimitri was discharged from [facility] in Stable condition.  At the hospital follow up visit please address:  1.  Please examine the patient's right axilla; attached photos can aid to help you track its healing. She was given vancomycin IV in the hospital and was transitioned to bactrim DS PO as an outpatient.   She also had developed bacterial conjunctivitis in both eyes and was treated in house and as outpatient with Polymixin drops. On clinical exam, she improved slightly on 3 days of care, but still had injection and production of ample discharge during morning exams. Please examine for improvement and consider sending for ophthalmologic assessment if necessary.  Finally, the patient had significant neck stiffness on movement, which was treated with PRN flexeril and heating pad. Please assess for improvement.  2.  Labs / imaging needed at time of follow-up: none  3.  Pending labs/ test needing follow-up: final blood culture  At admission: 12/16/13   After 2 days of vancomycin treatment 12/18/13:     Follow-up Appointments:     Follow-up Information   Follow up with SPENCER,SARA C, PA-C On 12/26/2013. (2:30PM)    Specialty:  Physician Assistant   Contact information:   [place]  [id number] [phone]       Discharge Instructions:  Please continue to keep your right armpit clean and bandaged. Take your antibiotics through the full course, even if the area is getting better. Take your eye drops as prescribed (2 drops in each eye every 6 hours) for the next 4 days. If either problem area is not getting better, your primary care doctor can help new treatment or a visit to the eye doctor if necessary during your appointment on August 6th. If your symptoms are getting worse, you should seek medical care.  Cellulitis    Cellulitis is an infection of the skin and the tissue under the skin. The  infected area is usually red and tender. This happens most often in the arms and lower legs.  HOME CARE  Take your antibiotic medicine as told. Finish the medicine even if you start to feel better.  Keep the infected arm or leg raised (elevated).  Put a warm cloth on the area up to 4 times per day.  Only take medicines as told by your doctor.  Keep all doctor visits as told. GET HELP IF:  You see red streaks on the skin coming from the infected area.  Your red area gets bigger or turns a dark color.  Your bone or joint under the infected area is painful after the skin heals.  Your infection comes back in the same area or different area.  You have a puffy (swollen) bump in the infected area.  You have new symptoms.  You have a fever. GET HELP RIGHT AWAY IF:  You feel very sleepy.  You throw up (vomit) or have watery poop (diarrhea).  You feel sick and have muscle aches and pains. MAKE SURE YOU:  Understand these instructions.  Will watch your condition.  Will get help right away if you are not doing well or get worse. Document Released: 10/26/2007 Document Revised: 09/23/2013 Document Reviewed: 07/25/2011  [facility] Patient Information 2015 [place], [place]. This information is not intended to replace advice given to you by your health care provider. Make sure you discuss any questions you have with your health care provider.  Abscess    An abscess (boil or furuncle) is an infected area on or under the skin. This area is filled with yellowish-white fluid (pus) and other material (debris).  HOME CARE  Only take medicines as told by your doctor.  If you were given antibiotic medicine, take it as directed. Finish the medicine even if you start to feel better.  If gauze is used, follow your doctor's directions for changing the gauze.  To avoid spreading the infection:  Keep your abscess covered with a bandage.  Wash your hands well.  Do not share personal care items, towels, or  whirlpools with others.  Avoid skin contact with others. Keep your skin and clothes clean around the abscess.  Keep all doctor visits as told. GET HELP RIGHT AWAY IF:  You have more pain, puffiness (swelling), or redness in the wound site.  You have more fluid or blood coming from the wound site.  You have muscle aches, chills, or you feel sick.  You have a fever. MAKE SURE YOU:  Understand these instructions.  Will watch your condition.  Will get help right away if you are not doing well or get worse. Document Released: 10/26/2007 Document Revised: 11/08/2011 Document Reviewed: 07/22/2011  [employer] Patient Information 2015  [employer]. This information is not intended to replace advice given to you by your health care provider. Make sure you discuss any questions you have with your health care provider.   Consultations:  none  Procedures Performed:  Upper Right Extremity Ultrasound 12/16/13: Scattered edema with question small developing fluid collection approximately 15 x 5 x 6 mm in size which could potentially represent a developing tiny abscess collection. No other definite defined fluid collections identified.  Admission HPI:  Ms. Verret is a 53 yo woman with a history of aortic valve replacement and mitral valve repair, thryoid cancer, HTN, HLD and multiple "boils" in her left axillary area (all resolved with hot compresses) who is here for evaluation of her right axillary cellulitis and pustular folliculitis. She reports that 4-5 days ago, she was walking outside and felt an insect sting in her right axilla. The area became quite pruritic, and she treated with cortisone. She then developed "blisters" the next morning in the same area. She treated these with alcohol and hydrogen peroxide. Over the ensuing days, her "blisters" got worse and yesterday, they started to open and drain. She has also noticed some fevers and night sweats. Finally, she developed a foreign body sensation in  her eyes that is worse when they are open. Her eyes have become red and she has drainage in the morning upon awakening. She has noticed that light bothers her a bit more than usual, but her vision is unchanged. She tried normal saline drops in her eyes; they did not help.   Hospital Course by problem list: Principal Problem:   Cellulitis of axillary region Active Problems:   Conjunctivitis of both eyes   Cardiomyopathy    S/P aortic valve repair   S/P MVR (mitral valve repair)   HTN (hypertension)   Hyperlipidemia   Hypothyroidism   Hx of thyroid cancer   This 53 year old woman has a medical history of hypertension, hyperlipidemia, cardiac surgery (repair of both aortic and mitral valves), thyroid cancer and subsequent postoperative hypothyroidism. She has a history of "boils" in both axillae and presented to the ED with right axillary cellulitis with open pustules and bacterial conjunctivitis in both eyes. While here, she developed neck stiffness. All symptoms showed improvement during the hospital course.   Pustular folliculitis with cellulitis and early abscess: The patient's wound culture came back showing gram positive cocci in clusters and pairs and gram positive and negative rods; the culture was likely contaminated. She likely has a staph infection, complicated by follicular pustules. Her US showed an early abscess that is too small to warrant an I&D. She was treated with IV vancomycin, then switched to bactrim DS PO BID on the day of discharge. We have performed teaching on keeping skin folds clean and dry and wearing loose-fitting clothing.  Bacterial conjunctivitis: Conjunctival erythema is likely autoinnoculated bacterial conjunctivitis that spread from her axillary infection through the patient's own contact with her eyes. Patient showed some symptomatic improvement, but still had significant redness and discharge in both eyes on last day in hospital.   Neck Pain: pain to palpation  on exam, pain worse on movement side-to-side (as in saying "no"), pain is worse than but similar to neck pain she has had in the past; improved with heating pad and flexeril. Patient was given direction to avoid driving / operating machinery if taking the flexeril.  Hypertension: reported hypertension, but patient was normotensive or hypotensive during this admission. She was never symptomatic from the low blood  pressures.  Thrush: Present on admission and persisted even with fluconazole and nystatin swish and swallow; likely due to patient's chronic inhaler use (has rx for inhalers from previous episode of bronchitis).  Discharge Vitals:   BP 119/53  Pulse 58  Temp(Src) 98.2 F (36.8 C) (Oral)  Resp 16  Ht 4\' 9"  (1.448 m)  Wt 197 lb 8.5 oz (89.6 kg)  BMI 42.73 kg/m2  SpO2 96%  LMP 03/06/2012  Pertinent Labs:   HgbA1c: 6.0%  WBC: 10.2  Blood culture with NGTD   Wound gram stain: RARE WBC PRESENT, PREDOMINANTLY MONONUCLEAR RARE SQUAMOUS EPITHELIAL CELLS PRESENT ABUNDANT GRAM POSITIVE COCCI IN PAIRS IN CLUSTERS FEW GRAM POSITIVE RODS FEW GRAM NEGATIVE RODS  Wound culture: Abundant staphylococcus aureus  HIV nonreactive  Signed: Drucilla Schmidt, MD 12/19/2013, 11:57 AM    Services Ordered on Discharge: none Equipment Ordered on Discharge: none

## 2013-12-22 LAB — CULTURE, BLOOD (ROUTINE X 2)
Culture: NO GROWTH
Culture: NO GROWTH

## 2014-02-27 ENCOUNTER — Telehealth: Payer: Self-pay | Admitting: Cardiology

## 2014-02-27 NOTE — Telephone Encounter (Signed)
Received 8 pages of records from [place] for appointment on 03/31/14 with Dr Ellyn Hack.  Records given to [facility] for Dr Darcus Pester schedule of 03/31/14  lp

## 2014-03-05 DIAGNOSIS — M542 Cervicalgia: Secondary | ICD-10-CM | POA: Insufficient documentation

## 2014-03-24 ENCOUNTER — Encounter (HOSPITAL_COMMUNITY): Payer: Self-pay | Admitting: General Practice

## 2014-03-31 ENCOUNTER — Encounter: Payer: Self-pay | Admitting: Cardiology

## 2014-03-31 ENCOUNTER — Ambulatory Visit (INDEPENDENT_AMBULATORY_CARE_PROVIDER_SITE_OTHER): Payer: Medicaid Other | Admitting: Cardiology

## 2014-03-31 VITALS — BP 104/60 | HR 67 | Ht <= 58 in | Wt 188.0 lb

## 2014-03-31 DIAGNOSIS — E785 Hyperlipidemia, unspecified: Secondary | ICD-10-CM

## 2014-03-31 DIAGNOSIS — I10 Essential (primary) hypertension: Secondary | ICD-10-CM

## 2014-03-31 DIAGNOSIS — Z9889 Other specified postprocedural states: Secondary | ICD-10-CM

## 2014-03-31 DIAGNOSIS — L910 Hypertrophic scar: Secondary | ICD-10-CM

## 2014-03-31 DIAGNOSIS — I5042 Chronic combined systolic (congestive) and diastolic (congestive) heart failure: Secondary | ICD-10-CM

## 2014-03-31 DIAGNOSIS — I428 Other cardiomyopathies: Secondary | ICD-10-CM

## 2014-03-31 DIAGNOSIS — I5043 Acute on chronic combined systolic (congestive) and diastolic (congestive) heart failure: Secondary | ICD-10-CM | POA: Insufficient documentation

## 2014-03-31 DIAGNOSIS — I429 Cardiomyopathy, unspecified: Secondary | ICD-10-CM

## 2014-03-31 HISTORY — DX: Hypertrophic scar: L91.0

## 2014-03-31 MED ORDER — FUROSEMIDE 40 MG PO TABS: 40.0000 mg | ORAL_TABLET | Freq: Every day | ORAL | Status: DC

## 2014-03-31 NOTE — Patient Instructions (Signed)
Your physician has requested that you have an echocardiogram. Echocardiography is a painless test that uses sound waves to create images of your heart. It provides your doctor with information about the size and shape of your heart and how well your heart's chambers and valves are working. This procedure takes approximately one hour. There are no restrictions for this procedure.  New change in lasix (furosmide)40 mg daily The first 3 days take lasix twice a day then daily  Do not discard 20 mg  Tablet. If your swelling is still there after 6 hours of taking your morning dose you can take 20 mg of lasix (furosemide)   Your physician wants you to follow-up in 2 -3 months Dr Ellyn Hack.  You will receive a reminder letter in the mail two months in advance. If you don't receive a letter, please call our office to schedule the follow-up appointment.

## 2014-03-31 NOTE — Progress Notes (Signed)
PATIENT: [name] MRN: [id number] DOB: 1961-05-18 PCP: Selinda Orion  Clinic Note: Chief Complaint  Patient presents with  . New Evaluation    Sent by her PCP to get re-established with a cardiologist.  States she has upper central chest pain daily (from the Keloids). No complaints of SOB, edema or dizziness.    HPI: [name] is a 53 y.o. female with a PMH below who presents today for reestablishing herself with a cardiologist. She is a long-standing history of nonischemic cardiomyopathy do some workup indicated by Takotsubo cardiomyopathy in the past. However she ended up developing severe mitral regurgitation and moderate aortic regurgitation that led to aortic valve and mitral valve repair in August of 2010.  At the time of her surgery, she had moderate - severe pulmonary hypertension that improved dramatically on relook echocardiogram in 2012. I actually last saw her in September of 2012, so she is now a new patient establishing care. Apparently she is established a new primary care provider realized that she had not beennot seeing cardiologist in several years and needed to get back.  Interval History: she presents today, doing relatively well. He'll walks routinely and does relatively well but will notice that after about 10-15 minutes she like to stop to catch her breath or dyspnea. She occasionally notes increased abdominal girth and has gained weight over the last couple years. She doesn't usually have edema. She did not mention anything about PND or orthopnea. She is not had any resting or exertional angina-type chest discomfort, and had a history of known non-obstructive CAD from her preop cath. She denies any rapid or irregular heart rate/rhythm lungs. No lightheadedness no dizziness or syncopal/near syncope, TIA/amaurosis fugax.  Otherwise review of systems is essentially negative. She is bothered by her keloid scar which is somewhat a couple of inches. She  also has intermittent pinching-type sensations are along the lateral aspect of her scar which probably goes along with her sternal wires.   Past Medical History  Diagnosis Date  . 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 Roxy Manns  Echo 12/2010: EF 40-45%, inferior hypokinesis. Slightly increased transaortic velocity = mild AS with mild to moderate AI normal MV gradients with no MR. Significantly improved PA pressures. Paradoxical septal motion.   . H/O Takotsubo cardiomyopathy     with long-standing combinted chronic systolic & diastolic HF.  [name] Chronic combined systolic and diastolic heart failure, NYHA class 2   . S/P MVR (mitral valve repair) 12/16/2013    26 mm Sorin MEMO 3D Ring Annuloplasty  . Essential hypertension 12/16/2013  . Hyperlipidemia with target LDL less than 100 12/16/2013  . 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 Hx of thyroid cancer 12/16/2013    Status post thyroidectomy  . Hypothyroidism 12/16/2013  . Dementia     Early onset  . History of uterine fibroid   . RAD (reactive airway disease)   . Dysthymia     Prior Cardiac Evaluation and Past Surgical History: Past Surgical History  Procedure Laterality Date  . Cardiac surgery    . Thyroid surgery    . Cesarean section    . Cardiac catheterization  August 2010    Preop: nonobstructive coronary disease  . Doppler echocardiography  May 2012     EF 40-45%, inferior hypokinesis. Slightly increased transaortic velocity = mild  AS with mild to moderate AI normal MV gradients with no MR. Significantly improved PA pressures. Paradoxical septal motion.  . Aortic valve repair  August 5732    Suture plication of all 3 commissures  . Mitral valve repair  August 2010     26 mm Sorin [facility] 3D Ring Annuloplasty    No Known Allergies  Current Outpatient Prescriptions  Medication Sig Dispense Refill  .  albuterol (PROVENTIL HFA;VENTOLIN HFA) 108 (90 BASE) MCG/ACT inhaler Inhale 2 puffs into the lungs 2 (two) times daily. Takes for shortness of breath    . beclomethasone (QVAR) 40 MCG/ACT inhaler Inhale 2 puffs into the lungs daily.     . carvedilol (COREG) 12.5 MG tablet Take 12.5 mg by mouth 2 (two) times daily with a meal.    . citalopram (CELEXA) 20 MG tablet Take 20 mg by mouth daily.    . cyclobenzaprine (FLEXERIL) 5 MG tablet Take 1 tablet (5 mg total) by mouth 3 (three) times daily as needed for muscle spasms (take for neck pain as needed). 30 tablet 0  . furosemide (LASIX) 20 MG tablet Take 20 mg by mouth daily as needed.    [name] levothyroxine (SYNTHROID, LEVOTHROID) 100 MCG tablet Take 100 mcg by mouth daily.    [name] lisinopril (PRINIVIL,ZESTRIL) 10 MG tablet Take 10 mg by mouth daily.    . traMADol (ULTRAM) 50 MG tablet Take 50 mg by mouth every 6 (six) hours as needed for moderate pain.    [name] trimethoprim-polymyxin b (POLYTRIM) ophthalmic solution Place 2 drops into both eyes every 6 (six) hours. 10 mL 0  . furosemide (LASIX) 40 MG tablet Take 1 tablet (40 mg total) by mouth daily. 30 tablet 6   No current facility-administered medications for this visit.    History   Social History Narrative   Unemployed, on disability.   Now is finally reestablished with a PCP.   She is a former smoker, quit in 2011. Denies alcohol consumption   Tries to walk routinely.    family history is not on file.  ROS: A comprehensive Review of Systems - was performed Review of Systems  Constitutional: Negative for weight loss and malaise/fatigue.  HENT: Negative for congestion and nosebleeds.   Respiratory: Negative for cough, shortness of breath and wheezing.   Cardiovascular: Negative for claudication.       Per history of present illness  Gastrointestinal: Negative for constipation, blood in stool and melena.  Genitourinary: Negative for hematuria.  Musculoskeletal: Positive for joint pain.    Neurological: Negative for dizziness, tremors, sensory change, speech change, focal weakness, seizures and loss of consciousness.  Endo/Heme/Allergies: Does not bruise/bleed easily.  Psychiatric/Behavioral: Negative for suicidal ideas and substance abuse. The patient is not nervous/anxious.        Mildly down mood.  Financial worries & children related concerns.  All other systems reviewed and are negative.  PHYSICAL EXAM BP 104/60 mmHg  Pulse 67  Ht 4\' 9"  (1.448 m)  Wt 188 lb (85.276 kg)  BMI 40.67 kg/m2  LMP 03/06/2012 Physical Exam   Adult ECG Report  Rate: 67 ;  Rhythm: normal sinus rhythm  QRS Axis: 61 ;  PR Interval: 202 - ~  1 AVB ;  QRS Duration: 126 ; QTc: 570;  Voltages: high - LVH with repolarization changes  Conduction Disturbances: first-degree A-V block  Q waves in Inferior & Anterolateral Leads - CRO MI, age undetermined.   Narrative Interpretation: normal sinus rhythm with criteria for LVH  with QRS widening and repolarization changes. Potentially prolonged QTC, rule out inferior and or anterolateral infarct, age undetermined.  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 She has not had an echocardiogram since 2012, and therefore would need to have a followup echo to reassess her aortic and mitral valves as well as her EF and filling pressures.   Chronic combined systolic and diastolic heart failure, NYHA class 2 She is noticing some exertional dyspnea with occasional increased abdominal girth and swelling. She is taking more than the 20 mg Lasix daily. Usually taking 20 twice a day most days out of the week.  She remains on stable dose of carvedilol and lisinopril as well as  Lasix which is a reduced dose when I saw her. She was on 40 twice a day when I saw her he currently is taking 20 mg a day.  Plan: Increase Lasix to 40 mg daily and have her take 40 twice a day for 3 days. She continues to 20 mg as an as needed dose in the afternoon she feels more short of breath.    I will see her back in followup, and we'll discuss sliding scale Lasix  Essential hypertension Very well-controlled on current medications. In fact we don't have a friend to up titrate for more afterload reduction based on her borderline blood pressures today  Hyperlipidemia with target LDL less than 100 She tells her that she was on a lipid medicine, but I don't see one. This is being followed by her PCP however.  Severe obesity (BMI >= 40) Discussed importance of dietary modification. She thinks this may be partially fluid, but still she is well overweight. She may benefit from nutrition consultation.    Orders Placed This Encounter  Procedures  . EKG 12-Lead  . 2D Echocardiogram without contrast    Standing Status: Future     Number of Occurrences:      Standing Expiration Date: 03/31/2015    Order Specific Question:  Type of Echo    Answer:  Complete    Order Specific Question:  Where should this test be performed    Answer:  [facility]    Order Specific Question:  Reason for exam-Echo    Answer:  Aortic Valve Disorder 424.1 / I35.9    Order Specific Question:  Reason for exam-Echo    Answer:  Mitral Valve Disorder  424.0 / I05.9    Order Specific Question:  Reason for exam-Echo    Answer:  Cardiomyopathy-Unspecified  425.9 / I42.9   Meds ordered this encounter  Medications  . furosemide (LASIX) 40 MG tablet    Sig: Take 1 tablet (40 mg total) by mouth daily.    Dispense:  30 tablet    Refill:  6    Followup: 2-3 months  Anfernee Peschke W. Ellyn Hack, M.D., M.S. Interventional Cardiolgy [facility]

## 2014-03-31 NOTE — Assessment & Plan Note (Signed)
She tells her that she was on a lipid medicine, but I don't see one. This is being followed by her PCP however.

## 2014-03-31 NOTE — Assessment & Plan Note (Signed)
,   she seems to been doing relatively well since I last saw her 3 years ago. She has not had an echocardiogram since 2012, and therefore would need to have a followup echo to reassess her aortic and mitral valves as well as her EF and filling pressures.

## 2014-03-31 NOTE — Assessment & Plan Note (Signed)
Discussed importance of dietary modification. She thinks this may be partially fluid, but still she is well overweight. She may benefit from nutrition consultation.

## 2014-03-31 NOTE — Assessment & Plan Note (Signed)
Very well-controlled on current medications. In fact we don't have a friend to up titrate for more afterload reduction based on her borderline blood pressures today

## 2014-03-31 NOTE — Assessment & Plan Note (Signed)
She is noticing some exertional dyspnea with occasional increased abdominal girth and swelling. She is taking more than the 20 mg Lasix daily. Usually taking 20 twice a day most days out of the week.  She remains on stable dose of carvedilol and lisinopril as well as Lasix which is a reduced dose when I saw her. She was on 40 twice a day when I saw her he currently is taking 20 mg a day.  Plan: Increase Lasix to 40 mg daily and have her take 40 twice a day for 3 days. She continues to 20 mg as an as needed dose in the afternoon she feels more short of breath.    I will see her back in followup, and we'll discuss sliding scale Lasix

## 2014-04-09 ENCOUNTER — Ambulatory Visit (HOSPITAL_COMMUNITY): Payer: Medicaid Other

## 2014-04-11 ENCOUNTER — Ambulatory Visit (HOSPITAL_COMMUNITY)
Admission: RE | Admit: 2014-04-11 | Discharge: 2014-04-11 | Disposition: A | Discharge status: Home / Self Care | Payer: Medicaid Other | Source: Ambulatory Visit | Attending: Cardiology | Admitting: Cardiology

## 2014-04-11 DIAGNOSIS — I10 Essential (primary) hypertension: Secondary | ICD-10-CM | POA: Diagnosis not present

## 2014-04-11 DIAGNOSIS — I428 Other cardiomyopathies: Secondary | ICD-10-CM | POA: Diagnosis not present

## 2014-04-11 DIAGNOSIS — I359 Nonrheumatic aortic valve disorder, unspecified: Secondary | ICD-10-CM

## 2014-04-11 DIAGNOSIS — E785 Hyperlipidemia, unspecified: Secondary | ICD-10-CM | POA: Diagnosis not present

## 2014-04-11 DIAGNOSIS — Z9889 Other specified postprocedural states: Secondary | ICD-10-CM

## 2014-04-11 NOTE — Progress Notes (Signed)
2D Echocardiogram Complete.  04/11/2014   [name] Guys, [place]

## 2014-07-02 ENCOUNTER — Ambulatory Visit: Payer: Medicaid Other | Admitting: Cardiology

## 2014-09-10 ENCOUNTER — Telehealth: Payer: Self-pay | Admitting: Cardiology

## 2014-09-11 NOTE — Telephone Encounter (Signed)
Close encounter 

## 2014-09-30 ENCOUNTER — Encounter: Payer: Self-pay | Admitting: Cardiology

## 2014-09-30 ENCOUNTER — Ambulatory Visit (INDEPENDENT_AMBULATORY_CARE_PROVIDER_SITE_OTHER): Payer: Medicaid Other | Admitting: Cardiology

## 2014-09-30 VITALS — BP 142/90 | HR 55 | Ht <= 58 in | Wt 191.8 lb

## 2014-09-30 DIAGNOSIS — Z79899 Other long term (current) drug therapy: Secondary | ICD-10-CM | POA: Diagnosis not present

## 2014-09-30 DIAGNOSIS — I428 Other cardiomyopathies: Secondary | ICD-10-CM

## 2014-09-30 DIAGNOSIS — R0602 Shortness of breath: Secondary | ICD-10-CM | POA: Diagnosis not present

## 2014-09-30 DIAGNOSIS — E039 Hypothyroidism, unspecified: Secondary | ICD-10-CM

## 2014-09-30 DIAGNOSIS — I10 Essential (primary) hypertension: Secondary | ICD-10-CM

## 2014-09-30 DIAGNOSIS — Z9889 Other specified postprocedural states: Secondary | ICD-10-CM

## 2014-09-30 MED ORDER — LISINOPRIL 20 MG PO TABS: 20.0000 mg | ORAL_TABLET | Freq: Every day | ORAL | Status: DC

## 2014-09-30 MED ORDER — FUROSEMIDE 40 MG PO TABS: 40.0000 mg | ORAL_TABLET | Freq: Every day | ORAL | Status: DC

## 2014-09-30 MED ORDER — METOLAZONE 2.5 MG PO TABS: 2.5000 mg | ORAL_TABLET | Freq: Every day | ORAL | Status: DC

## 2014-09-30 NOTE — Progress Notes (Signed)
Cardiology Office Note   Date:  09/30/2014   ID:  [name], DOB 11/04/1960, MRN [id number]  PCP:  Aura Dials, PA-C  Cardiologist:    Dr. Ellyn Hack  Chief Complaint  Patient presents with  . Edema    stomach/ going on for about a month/ wondering if Lasix not letting her use the bathroom/ loss of apetite      History of Present Illness: [name] is a 54 y.o. female who presents for increasing fullness wt. Gain, edema.  She has a long-standing history of nonischemic cardiomyopathy  indicated by Takotsubo cardiomyopathy in the past. However she ended up developing severe mitral regurgitation and moderate aortic regurgitation that led to aortic valve and mitral valve repair in August of 2010. At the time of her surgery, she had moderate - severe pulmonary hypertension that improved dramatically on relook echocardiogram in 2012.  Most reason echo 03/2014:  Left ventricle: The cavity size was mildly dilated. Wall thickness was normal. Systolic function was mildly to moderately reduced. The estimated ejection fraction was in the range of 40% to 45%. Severe hypokinesis of the apicalanterior, inferior, and apical myocardium. The study is not technically sufficient to allow evaluation of LV diastolic function. - Ventricular septum: Septal motion showed paradox. - Aortic valve: There was moderate stenosis. There was moderate regurgitation. - Mitral valve: Prior procedures included surgical repair. An annular ring prosthesis was present. The findings are consistent with moderate stenosis. There was mild regurgitation directed eccentrically. Valve area by pressure half-time: 2.16 cm^2. - Left atrium: The atrium was moderately dilated. - Right ventricle: Systolic function was mildly reduced.  Today increasing fullness in her abd.  She does have chronic irritable bowel as well.  No chest pain.  She does have DOE. Wt has increased. Pt is taking lasix  at 40 mg BID now without much change.    Past Medical History  Diagnosis Date  . 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 Roxy Manns  Echo 12/2010: EF 40-45%, inferior hypokinesis. Slightly increased transaortic velocity = mild AS with mild to moderate AI normal MV gradients with no MR. Significantly improved PA pressures. Paradoxical septal motion.   . H/O Takotsubo cardiomyopathy     with long-standing combinted chronic systolic & diastolic HF.  [name] Chronic combined systolic and diastolic heart failure, NYHA class 2   . S/P MVR (mitral valve repair) 12/16/2013    26 mm Sorin MEMO 3D Ring Annuloplasty  . Essential hypertension 12/16/2013  . Hyperlipidemia with target LDL less than 100 12/16/2013  . 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 Hx of thyroid cancer 12/16/2013    Status post thyroidectomy  . Hypothyroidism 12/16/2013  . Dementia     Early onset  . History of uterine fibroid   . RAD (reactive airway disease)   . Dysthymia     Past Surgical History  Procedure Laterality Date  . Cardiac surgery    . Thyroid surgery    . Cesarean section    . Cardiac catheterization  August 2010    Preop: nonobstructive coronary disease  . Doppler echocardiography  May 2012     EF 40-45%, inferior hypokinesis. Slightly increased transaortic velocity = mild AS with mild to moderate AI normal MV gradients with no MR. Significantly improved PA pressures. Paradoxical septal motion.  . Aortic valve repair  August 1610    Suture plication of all 3 commissures  . Mitral valve repair  August 2010     26 mm Sorin [facility] 3D Ring Annuloplasty     Current Outpatient Prescriptions  Medication Sig Dispense Refill  . albuterol (PROVENTIL HFA;VENTOLIN HFA) 108 (90 BASE) MCG/ACT inhaler Inhale 2 puffs into the lungs 2 (two) times daily. Takes for shortness of breath    . atorvastatin  (LIPITOR) 10 MG tablet Take 1 tablet by mouth daily.    . beclomethasone (QVAR) 40 MCG/ACT inhaler Inhale 2 puffs into the lungs daily.     . carvedilol (COREG) 12.5 MG tablet Take 12.5 mg by mouth 2 (two) times daily with a meal.    . citalopram (CELEXA) 20 MG tablet Take 20 mg by mouth daily.    . cyclobenzaprine (FLEXERIL) 5 MG tablet Take 1 tablet (5 mg total) by mouth 3 (three) times daily as needed for muscle spasms (take for neck pain as needed). 30 tablet 0  . furosemide (LASIX) 40 MG tablet Take 1 tablet (40 mg total) by mouth daily. 30 tablet 6  . levothyroxine (SYNTHROID, LEVOTHROID) 100 MCG tablet Take 100 mcg by mouth daily.    . traMADol (ULTRAM) 50 MG tablet Take 50 mg by mouth every 6 (six) hours as needed for moderate pain.    [name] trimethoprim-polymyxin b (POLYTRIM) ophthalmic solution Place 2 drops into both eyes every 6 (six) hours. 10 mL 0  . lisinopril (PRINIVIL,ZESTRIL) 20 MG tablet Take 1 tablet (20 mg total) by mouth daily. 30 tablet 9  . metolazone (ZAROXOLYN) 2.5 MG tablet Take 1 tablet (2.5 mg total) by mouth daily. 10 tablet 0   No current facility-administered medications for this visit.    Allergies:   Review of patient's allergies indicates no known allergies.    Social History:  The patient  reports that she has quit smoking. She has never used smokeless tobacco. She reports that she does not drink alcohol or use illicit drugs.   Family History:  The patient's family history includes CVA in her mother; Heart disease in her mother.    ROS:  General:no colds or fevers, + weight increase Skin:no rashes or ulcers HEENT:no blurred vision, no congestion CV:see HPI PUL:see HPI GI:no diarrhea constipation or melena, no indigestion GU:no hematuria, no dysuria MS:no joint pain, no claudication Neuro:no syncope, no lightheadedness Endo:no diabetes, + thyroid disease  Wt Readings from Last 3 Encounters:  09/30/14 191 lb 12.8 oz (87 kg)  03/31/14 188 lb (85.276  kg)  12/19/13 197 lb 8.5 oz (89.6 kg)     PHYSICAL EXAM: VS:  BP 142/90 mmHg  Pulse 55  Ht 4\' 9"  (1.448 m)  Wt 191 lb 12.8 oz (87 kg)  BMI 41.49 kg/m2  LMP 03/06/2012 , BMI Body mass index is 41.49 kg/(m^2). General:Pleasant affect, NAD Skin:Warm and dry, brisk capillary refill HEENT:normocephalic, sclera clear, mucus membranes moist Neck:supple, no JVD, no bruits  Heart:S1S2 RRR with 2-3/6 squeaky murmur, no gallup, rub or click Lungs:clear without rales, rhonchi, or wheezes RUE:AVWU, non tender, + BS, do not palpate liver spleen or masses Ext:no lower ext edema, 2+ pedal pulses, 2+ radial pulses Neuro:alert and oriented, MAE, follows commands, + facial symmetry    EKG:  EKG is ordered today. The ekg ordered today demonstrates SR with PVC, LVH no acute changes from previous.     Recent Labs: 12/16/2013: BUN 8; Creatinine 0.81; Hemoglobin 12.2; Platelets 153; Potassium 4.5; Sodium 134*  Lipid Panel    Component Value Date/Time   CHOL 275* 10/07/2010 0650   TRIG 114 10/07/2010 0650   HDL 49 10/07/2010 0650   CHOLHDL 5.6 10/07/2010 0650   VLDL 23 10/07/2010 0650   LDLCALC * 10/07/2010 0650    203        Total Cholesterol/HDL:CHD Risk Coronary Heart Disease Risk Table                     Men   Women  1/2 Average Risk   3.4   3.3  Average Risk       5.0   4.4  2 X Average Risk   9.6   7.1  3 X Average Risk  23.4   11.0        Use the calculated Patient Ratio above and the CHD Risk Table to determine the patient's CHD Risk.        ATP III CLASSIFICATION (LDL):  <100     mg/dL   Optimal  100-129  mg/dL   Near or Above                    Optimal  130-159  mg/dL   Borderline  160-189  mg/dL   High  >190     mg/dL   Very High       Other studies Reviewed: Additional studies/ records that were reviewed today include: previous notes, echo labs.   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  Chronic with acute  combined systolic and diastolic heart failure, NYHA class 2 She is noticing some exertional dyspnea with occasional increased abdominal girth. She is taking more than the 40 mg Lasix BID  As per Dr. Keturah Barre. Harding's last instructions.   She remains on stable dose of carvedilol and lisinopril I did increase to 20 mg with her HTN . Will add metolazone to her 40 mg BID, 2.5 mg before lasix for 4 days, if no improvement she will call us. Will check CBC, TSH and BMP today.  She will be back in 3 weeks for follow up.   Essential hypertension Elevated, increased lisinopril.  Hyperlipidemia with target LDL less than 100 She tells her that she was on a lipid medicine, but I don't see one. This is being followed by her PCP however.  Severe obesity (BMI >= 40) Discussed importance of dietary modification. She thinks this may be partially fluid, but still she is well overweight. She may benefit from nutrition consultation.      Current medicines are reviewed with the patient today.  The patient Has no concerns regarding medicines.  The following changes have been made:  See above Labs/ tests ordered today include:see above  Disposition:   FU:  see above  Lennie Muckle, NP  09/30/2014 10:39 PM    [facility] [place], [place], [place], [place] Phone: [phone]; Fax: [phone]

## 2014-09-30 NOTE — Patient Instructions (Addendum)
Your physician recommends that you schedule a follow-up appointment in: 2 Weeks with APP  Your physician has recommended you make the following change in your medication: Increase Lisinopril to 20 mg daily and Take <Metolazone 2.5 30 mins before taking lasix for 4 days  Your physician recommends that you return for lab work CBC, TSH. BMP  Weigh daily. Call [phone] if weight climbs more than 3 pounds in a day or 5 pounds in a week. No salt to very little salt in your diet.  No more than 2000 mg in a day. Call if increased shortness of breath or increased swelling.

## 2014-11-04 ENCOUNTER — Ambulatory Visit: Payer: Medicaid Other | Admitting: Cardiology

## 2015-03-14 ENCOUNTER — Other Ambulatory Visit: Payer: Self-pay | Admitting: Physician Assistant

## 2015-03-14 DIAGNOSIS — Z1231 Encounter for screening mammogram for malignant neoplasm of breast: Secondary | ICD-10-CM

## 2015-03-27 ENCOUNTER — Ambulatory Visit
Admission: RE | Admit: 2015-03-27 | Discharge: 2015-03-27 | Disposition: A | Discharge status: Home / Self Care | Payer: Medicaid Other | Source: Ambulatory Visit | Attending: Physician Assistant | Admitting: Physician Assistant

## 2015-03-27 DIAGNOSIS — Z1231 Encounter for screening mammogram for malignant neoplasm of breast: Secondary | ICD-10-CM

## 2015-04-13 ENCOUNTER — Ambulatory Visit (INDEPENDENT_AMBULATORY_CARE_PROVIDER_SITE_OTHER): Payer: Medicaid Other | Admitting: Cardiology

## 2015-04-13 VITALS — BP 112/74 | HR 89 | Ht <= 58 in | Wt 184.6 lb

## 2015-04-13 DIAGNOSIS — I10 Essential (primary) hypertension: Secondary | ICD-10-CM

## 2015-04-13 DIAGNOSIS — I5042 Chronic combined systolic (congestive) and diastolic (congestive) heart failure: Secondary | ICD-10-CM

## 2015-04-13 DIAGNOSIS — L910 Hypertrophic scar: Secondary | ICD-10-CM

## 2015-04-13 DIAGNOSIS — R609 Edema, unspecified: Secondary | ICD-10-CM

## 2015-04-13 DIAGNOSIS — E785 Hyperlipidemia, unspecified: Secondary | ICD-10-CM | POA: Diagnosis not present

## 2015-04-13 DIAGNOSIS — R600 Localized edema: Secondary | ICD-10-CM

## 2015-04-13 DIAGNOSIS — I428 Other cardiomyopathies: Secondary | ICD-10-CM | POA: Diagnosis not present

## 2015-04-13 DIAGNOSIS — Z9889 Other specified postprocedural states: Secondary | ICD-10-CM

## 2015-04-13 MED ORDER — FUROSEMIDE 40 MG PO TABS
40.0000 mg | ORAL_TABLET | Freq: Two times a day (BID) | ORAL | Status: DC
Start: 2015-04-13 — End: 2016-04-29

## 2015-04-13 MED ORDER — METOLAZONE 2.5 MG PO TABS: 2.5000 mg | ORAL_TABLET | Freq: Every day | ORAL | Status: DC | PRN

## 2015-04-13 NOTE — Progress Notes (Signed)
PCP: Aura Dials, PA-C  Clinic Note: Chief Complaint  Patient presents with  . Follow-up  . Chest Pain    pt states no more than usual  . Shortness of Breath    no SOB, no light headedness or dizziness  . Edema    no edema  . Cardiac Valve Problem    s/p MVR, AVR  . Cardiomyopathy    valvular    HPI: [name] is a 54 y.o. female with a PMH below who presents today for 6 month f/u of Valvular CM.[name] She has a long-standing history of nonischemic cardiomyopathy do some workup indicated by Takotsubo cardiomyopathy in the past. However she ended up developing severe mitral regurgitation and moderate aortic regurgitation that led to aortic valve and mitral valve repair in August of 2010. At the time of her surgery, she had moderate - severe pulmonary hypertension that improved dramatically on relook echocardiogram in 2012 (following the 01/2011 appointment, she was lost to f/u until Nov 2015 - her last visit with me).  Nils Flack was last seen by Cecilie Kicks, NP in May 2016 with c/o dyspnea & abdominal swelling. Was taking additional PRN Lasix. Added Metolazone 2.5 mg ( never filled the Rx)  Recent Hospitalizations: none  Studies Reviewed: none  Interval History:  Myliah presents noting that her symptoms are pretty stable.   She still has MSK & scar related chest pains - sharp & pulling discomfort.  For the most part, her weight has remained stable & her CHF symptoms have been controlled. Maybe 1-2 x week has to take additional Lasix. + Tired & DOE, but likes to walk.  Is trying to loose weight.   Has had a fall x 1 - lost balance while standing on chair - no syncope / near syncope.   No chest pain or shortness of breath with rest or exertion.  Very rarely has PND or orthopnea, but no edema.  No palpitations, lightheadedness, dizziness, weakness or syncope/near syncope. No TIA/amaurosis fugax symptoms.   ROS: A comprehensive was performed. Review of  Systems  Constitutional: Negative for malaise/fatigue.  HENT: Negative for nosebleeds.   Respiratory: Positive for shortness of breath (occasional). Negative for cough.   Cardiovascular: Positive for leg swelling (but usuallly notes volume buid-up in abdomen.). Negative for claudication.  Gastrointestinal: Negative for blood in stool and melena.  Genitourinary: Negative for hematuria.  Musculoskeletal: Positive for falls (see above).  Neurological: Negative for dizziness, weakness and headaches.    Past Medical History  Diagnosis Date  . 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 Roxy Manns  Echo 12/2010: EF 40-45%, inferior hypokinesis. Slightly increased transaortic velocity = mild AS with mild to moderate AI normal MV gradients with no MR. Significantly improved PA pressures. Paradoxical septal motion.   . H/O Takotsubo cardiomyopathy     with long-standing combinted chronic systolic & diastolic HF.  [name] Chronic combined systolic and diastolic heart failure, NYHA class 2 ([place])   . S/P MVR (mitral valve repair) 12/16/2013    26 mm Sorin MEMO 3D Ring Annuloplasty  . Essential hypertension 12/16/2013  . Hyperlipidemia with target LDL less than 100 12/16/2013  . 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 Hx of thyroid cancer 12/16/2013    Status post thyroidectomy  . Hypothyroidism 12/16/2013  . Dementia     Early onset  .  History of uterine fibroid   . RAD (reactive airway disease)   . Dysthymia     Past Surgical History  Procedure Laterality Date  . Cardiac surgery    . Thyroid surgery    . Cesarean section    . Cardiac catheterization  August 2010    Preop: nonobstructive coronary disease  . Doppler echocardiography  May 2012     EF 40-45%, inferior hypokinesis. Slightly increased transaortic velocity = mild AS with mild to moderate AI normal MV gradients with no  MR. Significantly improved PA pressures. Paradoxical septal motion.  . Aortic valve repair  August [id number]    Suture plication of all 3 commissures  . Mitral valve repair  August 2010     26 mm Sorin MEMO 3D Ring Annuloplasty   Prior to Admission medications   Medication Sig Start Date End Date Taking? Authorizing Provider  albuterol (PROVENTIL HFA;VENTOLIN HFA) 108 (90 BASE) MCG/ACT inhaler Inhale 2 puffs into the lungs 2 (two) times daily. Takes for shortness of breath   Yes Historical Provider, MD  atorvastatin (LIPITOR) 10 MG tablet Take 1 tablet by mouth daily. 04/02/14  Yes Historical Provider, MD  beclomethasone (QVAR) 40 MCG/ACT inhaler Inhale 2 puffs into the lungs daily.    Yes Historical Provider, MD  carvedilol (COREG) 12.5 MG tablet Take 12.5 mg by mouth 2 (two) times daily with a meal.   Yes Historical Provider, MD  citalopram (CELEXA) 20 MG tablet Take 20 mg by mouth daily.   Yes Historical Provider, MD  cyclobenzaprine (FLEXERIL) 5 MG tablet Take 1 tablet (5 mg total) by mouth 3 (three) times daily as needed for muscle spasms (take for neck pain as needed). 12/19/13  Yes Karlene Einstein, MD  furosemide (LASIX) 40 MG tablet Take 1 tablet (40 mg total) by mouth daily. Patient taking differently: Take 40 mg by mouth 2 (two) times daily.  09/30/14  Yes Isaiah Serge, NP  hydrocortisone (ANUSOL-HC) 2.5 % rectal cream Apply 2.5 application topically daily. 02/12/15 02/12/16 Yes Historical Provider, MD  levothyroxine (SYNTHROID, LEVOTHROID) 100 MCG tablet Take 100 mcg by mouth daily.   Yes Historical Provider, MD  lisinopril (PRINIVIL,ZESTRIL) 20 MG tablet Take 1 tablet (20 mg total) by mouth daily. 09/30/14  Yes Isaiah Serge, NP  metolazone (ZAROXOLYN) 2.5 MG tablet Take 1 tablet (2.5 mg total) by mouth daily. 09/30/14  Yes Isaiah Serge, NP  rosuvastatin (CRESTOR) 10 MG tablet Take 10 mg by mouth daily. 02/13/15 02/13/16 Yes Historical Provider, MD  traMADol (ULTRAM) 50 MG tablet Take 50 mg  by mouth every 6 (six) hours as needed for moderate pain.   Yes Historical Provider, MD  trimethoprim-polymyxin b (POLYTRIM) ophthalmic solution Place 2 drops into both eyes every 6 (six) hours. 12/19/13  Yes Karlene Einstein, MD   No Known Allergies   Social History   Social History  . Marital Status: Single    Spouse Name: N/A  . Number of Children: N/A  . Years of Education: N/A   Social History Main Topics  . Smoking status: Former Research scientist (life sciences)  . Smokeless tobacco: Never Used     Comment: quit 2011  . Alcohol Use: No  . Drug Use: No  . Sexual Activity: Not Currently   Other Topics Concern  . None   Social History Narrative   Unemployed, on disability.   Now is finally reestablished with a PCP.   She is a former smoker, quit in 2011. Denies alcohol consumption  Tries to walk routinely.   Family History  Problem Relation Age of Onset  . Heart disease Mother   . CVA Mother   '  Wt Readings from Last 3 Encounters:  04/13/15 184 lb 9.6 oz (83.734 kg)  09/30/14 191 lb 12.8 oz (87 kg)  03/31/14 188 lb (85.276 kg)    PHYSICAL EXAM BP 112/74 mmHg  Pulse 89  Ht 4\' 9"  (1.448 m)  Wt 184 lb 9.6 oz (83.734 kg)  BMI 39.94 kg/m2  LMP 03/06/2012 General appearance: alert, cooperative, appears stated age, no distress and moderately obese Neck: no adenopathy, no carotid bruit and no JVD Lungs: clear to auscultation bilaterally, normal percussion bilaterally and non-labored Heart: RRR with ectopy, S1 & S2 normal, no murmur, click, rub or gallop; mildly displaced PMI  Abdomen: soft, non-tender; bowel sounds normal; no masses,  no organomegaly; mildly increased girth per her report Extremities: extremities normal, atraumatic, no cyanosis, and edema - trace Pulses: 2+ and symmetric;  Skin: mobility and turgor normal Neurologic: Mental status: Alert, oriented, thought content appropriate Cranial nerves: normal (II-XII grossly intact)    Adult ECG Report  Rate: 89 ;  Rhythm: normal  sinus rhythm, premature ventricular contractions (PVC) and baselin wander.  LVH with widening of QRS & repolarization changes ( TWI in V5-6, II, III, aVF). CRO Inferior MI Age undetermined. ;   Narrative Interpretation: Relatively stable EKG compared to May 2016.   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 Re-Rx Metolazone PRN if standing Lasix dose is ineffective.  On Coreg & Lisinopril @ stable dosing. Standing Lasix with Sliding Scale for PND/Orthopnea of wgt increase > 3 lb. If > 5 use Metolazone 30 min prior to Lasix.  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      Hyperlipidemia with target LDL less than 100 (Chronic)    On Lipitor 10 mg - followed by PCP      Relevant Medications   rosuvastatin (CRESTOR) 10 MG tablet   furosemide (LASIX) 40 MG tablet   metolazone (ZAROXOLYN) 2.5 MG tablet   Other Relevant Orders   EKG 12-Lead (Completed)   ECHOCARDIOGRAM COMPLETE   EKG 12-Lead   Essential hypertension (Chronic)    Stable BP on current regimen.        Relevant Medications   rosuvastatin (CRESTOR) 10 MG tablet   furosemide (LASIX) 40 MG tablet   metolazone (ZAROXOLYN) 2.5 MG tablet   Other Relevant Orders   EKG  12-Lead (Completed)   ECHOCARDIOGRAM COMPLETE   EKG 12-Lead   Edema extremities    On diuretic regimen as noted.      Relevant Orders   EKG 12-Lead (Completed)   ECHOCARDIOGRAM COMPLETE   EKG 12-Lead   Chronic combined systolic and diastolic heart failure, NYHA class 2 ([facility]) (Chronic)    Her Sx usually are related to increased abdominal girth & DOE. On higher (40 mg) BID dose of Lasix. Adding Metolazone for PRN use with Sliding Scale (weight & symptoms related)>      Relevant Medications   rosuvastatin (CRESTOR) 10 MG tablet   furosemide (LASIX) 40 MG tablet   metolazone (ZAROXOLYN) 2.5 MG tablet      Current medicines are reviewed at length with the patient today. (+/- concerns) dosing of Diuretic (never had metolazone Rx filled). The following changes have been made: Re Rx metolazone. MAY use metolazone  If weight is up 4 lbs in one day or short of breath  May use lasix (furosmide) 40 mg -1 - to 2 tablets as need for swelling.  Studies Ordered:   Orders Placed This Encounter  Procedures  . EKG 12-Lead  . EKG 12-Lead  . ECHOCARDIOGRAM COMPLETE     Brainard Highfill, Leonie Green, M.D., M.S. Interventional Cardiologist   Pager # [phone]

## 2015-04-13 NOTE — Patient Instructions (Signed)
Your physician wants you to follow-up in 12 months with Dr Ellyn Hack.   You will receive a reminder letter in the mail two months in advance. If you don't receive a letter, please call our office to schedule the follow-up appointment.  Your physician has requested that you have an echocardiogram at [address]. Echocardiography is a painless test that uses sound waves to create images of your heart. It provides your doctor with information about the size and shape of your heart and how well your heart's chambers and valves are working. This procedure takes approximately one hour. There are no restrictions for this procedure.   MAY use metolazone  If weight is up 4 lbs in one day or short of breath  May use lasix (furosmide) 40 mg -1 - to 2 tablets as need for swelling.  If you need a refill on your cardiac medications before your next appointment, please call your pharmacy.

## 2015-04-15 ENCOUNTER — Encounter: Payer: Self-pay | Admitting: Cardiology

## 2015-04-15 DIAGNOSIS — R600 Localized edema: Secondary | ICD-10-CM | POA: Insufficient documentation

## 2015-04-15 NOTE — Assessment & Plan Note (Signed)
On Lipitor 10 mg - followed by PCP

## 2015-04-15 NOTE — Assessment & Plan Note (Signed)
Relatively stable symptoms - intermittent episodes of increased volume. Re-Rx Metolazone PRN if standing Lasix dose is ineffective.  On Coreg & Lisinopril @ stable dosing. Standing Lasix with Sliding Scale for PND/Orthopnea of wgt increase > 3 lb. If > 5 use Metolazone 30 min prior to Lasix.  Plan to recheck Echo

## 2015-04-15 NOTE — Assessment & Plan Note (Signed)
On diuretic regimen as noted.

## 2015-04-15 NOTE — Assessment & Plan Note (Signed)
Stable BP on current regimen.

## 2015-04-15 NOTE — Assessment & Plan Note (Signed)
Recheck Echo

## 2015-04-15 NOTE — Assessment & Plan Note (Signed)
Her Sx usually are related to increased abdominal girth & DOE. On higher (40 mg) BID dose of Lasix. Adding Metolazone for PRN use with Sliding Scale (weight & symptoms related)>

## 2015-04-15 NOTE — Assessment & Plan Note (Signed)
Source of MSK chest discomfort - not noticing changes.

## 2015-04-27 ENCOUNTER — Ambulatory Visit (HOSPITAL_COMMUNITY): Payer: Medicaid Other

## 2015-05-08 ENCOUNTER — Ambulatory Visit (HOSPITAL_COMMUNITY): Payer: Medicaid Other | Attending: Cardiology

## 2015-05-08 ENCOUNTER — Other Ambulatory Visit: Payer: Self-pay

## 2015-05-08 DIAGNOSIS — I059 Rheumatic mitral valve disease, unspecified: Secondary | ICD-10-CM | POA: Diagnosis present

## 2015-05-08 DIAGNOSIS — R609 Edema, unspecified: Secondary | ICD-10-CM | POA: Insufficient documentation

## 2015-05-08 DIAGNOSIS — I10 Essential (primary) hypertension: Secondary | ICD-10-CM | POA: Diagnosis not present

## 2015-05-08 DIAGNOSIS — Z9889 Other specified postprocedural states: Secondary | ICD-10-CM | POA: Diagnosis not present

## 2015-05-08 DIAGNOSIS — I517 Cardiomegaly: Secondary | ICD-10-CM | POA: Insufficient documentation

## 2015-05-08 DIAGNOSIS — I352 Nonrheumatic aortic (valve) stenosis with insufficiency: Secondary | ICD-10-CM | POA: Diagnosis not present

## 2015-05-08 DIAGNOSIS — I428 Other cardiomyopathies: Secondary | ICD-10-CM | POA: Insufficient documentation

## 2015-05-08 DIAGNOSIS — I5189 Other ill-defined heart diseases: Secondary | ICD-10-CM | POA: Diagnosis not present

## 2015-05-08 DIAGNOSIS — E785 Hyperlipidemia, unspecified: Secondary | ICD-10-CM

## 2015-05-08 DIAGNOSIS — R600 Localized edema: Secondary | ICD-10-CM

## 2015-05-08 DIAGNOSIS — E039 Hypothyroidism, unspecified: Secondary | ICD-10-CM | POA: Insufficient documentation

## 2015-05-08 DIAGNOSIS — R29898 Other symptoms and signs involving the musculoskeletal system: Secondary | ICD-10-CM | POA: Insufficient documentation

## 2015-05-15 ENCOUNTER — Telehealth: Payer: Self-pay | Admitting: *Deleted

## 2015-05-15 NOTE — Telephone Encounter (Signed)
Left detailed message on secure voice mail May call back if any question

## 2015-05-15 NOTE — Telephone Encounter (Signed)
-----   Message from Leonie Man, MD sent at 05/14/2015  9:52 PM EST ----- Overall, when compared to last year's echo - the LV EF is just a bit lower (34-40% from 40-45%), but otherwise pretty stable.  Still shows Mild-moderate AS & AI - no change. MVR ring is stable.   Leonie Man, MD  Pls fwd to PCP:: Aura Dials, PA-C

## 2016-04-29 ENCOUNTER — Other Ambulatory Visit: Payer: Self-pay | Admitting: Cardiology

## 2016-04-29 NOTE — Telephone Encounter (Signed)
Rx(s) sent to pharmacy electronically.  

## 2016-05-10 DIAGNOSIS — C73 Malignant neoplasm of thyroid gland: Secondary | ICD-10-CM | POA: Insufficient documentation

## 2016-05-10 DIAGNOSIS — E890 Postprocedural hypothyroidism: Secondary | ICD-10-CM | POA: Insufficient documentation

## 2016-05-11 ENCOUNTER — Encounter: Payer: Self-pay | Admitting: Cardiology

## 2016-05-11 ENCOUNTER — Ambulatory Visit (INDEPENDENT_AMBULATORY_CARE_PROVIDER_SITE_OTHER): Payer: Medicaid Other | Admitting: Cardiology

## 2016-05-11 VITALS — BP 124/62 | HR 68 | Ht <= 58 in | Wt 182.8 lb

## 2016-05-11 DIAGNOSIS — I5042 Chronic combined systolic (congestive) and diastolic (congestive) heart failure: Secondary | ICD-10-CM

## 2016-05-11 DIAGNOSIS — I428 Other cardiomyopathies: Secondary | ICD-10-CM | POA: Diagnosis not present

## 2016-05-11 DIAGNOSIS — Z9889 Other specified postprocedural states: Secondary | ICD-10-CM | POA: Diagnosis not present

## 2016-05-11 DIAGNOSIS — I10 Essential (primary) hypertension: Secondary | ICD-10-CM | POA: Diagnosis not present

## 2016-05-11 DIAGNOSIS — E785 Hyperlipidemia, unspecified: Secondary | ICD-10-CM

## 2016-05-11 MED ORDER — FUROSEMIDE 40 MG PO TABS: 20.0000 mg | ORAL_TABLET | Freq: Two times a day (BID) | ORAL | 11 refills | Status: DC

## 2016-05-11 NOTE — Progress Notes (Signed)
PCP: Aura Dials, PA-C  Clinic Note: Chief Complaint  Patient presents with  . Follow-up    Valvular heart disease/cardiac myopathy    HPI: [name] is a 55 y.o. 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 She has a long history of nonischemic cardiomyopathy with some suggestion that it may be related to consider cardiomyopathy. However she did developed severe mitral regurgitation and moderate aortic operative aortic regurgitation and ended up having aortic valve & mitral valve repair back in August 2010. At the time of her surgery, she had moderate - severe pulmonary hypertension that improved dramatically on relook echocardiogram in 2012 (following the 01/2011 appointment, she was lost to f/u until Nov 2015 - her last visit with me).  OURANIA Hale was last seen in late November 2016 = she was doing relatively well without any major symptoms besides some postop scar related discomfort. Having to take an additional dose of Lasix may be one or 2 times a week. Like to walk, but does note some fatigue and exertional dyspnea. Trying to lose weight still.  Recent Hospitalizations:  No hospitalization, but has been diagnosed with papillary thyroid carcinoma - waiting on full work-up  Studies Reviewed:   Transthoracic Echo December 2016: EF 34-40% (down from 40 and 45%).  Otherwise stable. Mild to moderate aortic stenosis/aortic regurgitation. Stable mitral valve ring.  Interval History: Complete presents today pretty much doing well. She has not really noticed any real symptoms of true chest tightness or pressure. No significant dyspnea with rest or exertion. She does have some orthopnea as a tightness sensation in her chest, but not really exertional dyspnea.  Does a lot of walking & tries to exercise - still gets tired, but pushes on. Still has to take additional Lasix every other day.   No chest pain or shortness of breath  with rest or exertion. No PND, orthopnea or edema. No palpitations, lightheadedness, dizziness, weakness or syncope/near syncope. No TIA/amaurosis fugax symptoms. No melena, hematochezia - but does note some occasional bleeding from her hemorrhoids., hematuria, or epstaxis. No claudication.  She has extreme sensitivity to touching her keloid scar from her sternotomy. This discomfort is probably the most troubling symptom for her.  ROS: A comprehensive was performed. Review of Systems  Constitutional: Negative for malaise/fatigue.  HENT: Negative for congestion and sinus pain.   Respiratory: Positive for shortness of breath (Occasional episodes, but not routinely.). Negative for cough.   Cardiovascular: Positive for orthopnea.  Gastrointestinal: Negative for abdominal pain, constipation, diarrhea and melena.       Hemorrhoids with occasional hemorrhoidal pain  Genitourinary: Negative for hematuria.  Musculoskeletal: Negative for falls, joint pain and myalgias.  Skin:       Extremely sensitive keloid scar on chest  Neurological: Negative for dizziness.  Endo/Heme/Allergies: Negative for environmental allergies.  Psychiatric/Behavioral: Negative for depression and memory loss. The patient is not nervous/anxious and does not have insomnia.   All other systems reviewed and are negative.   Past Medical History:  Diagnosis Date  . Chronic combined systolic and diastolic heart failure, NYHA class 2 ([place])   . Dementia    Early onset  . Dysthymia   . Essential hypertension 12/16/2013  . H/O Takotsubo cardiomyopathy    with long-standing combinted chronic systolic & diastolic HF.  [name] History of uterine fibroid   . Hx of thyroid cancer 12/16/2013   Status post thyroidectomy  . Hyperlipidemia with target LDL less  than 100 12/16/2013  . Hypothyroidism 12/16/2013  . 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 RAD (reactive airway disease)   . S/P MVR (mitral valve repair) 12/16/2013     26 mm Sorin MEMO 3D Ring Annuloplasty  . 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 Roxy Manns  Echo 12/2010: EF 40-45%, inferior hypokinesis. Slightly increased transaortic velocity = mild AS with mild to moderate AI normal MV gradients with no MR. Significantly improved PA pressures. Paradoxical septal motion.     Past Surgical History:  Procedure Laterality Date  . AORTIC VALVE REPAIR  August [id number]   Suture plication of all 3 commissures  . CARDIAC CATHETERIZATION  August 2010   Preop: nonobstructive coronary disease  . CARDIAC SURGERY    . CESAREAN SECTION    . DOPPLER ECHOCARDIOGRAPHY  May 2012    EF 40-45%, inferior hypokinesis. Slightly increased transaortic velocity = mild AS with mild to moderate AI normal MV gradients with no MR. Significantly improved PA pressures. Paradoxical septal motion.  [name] MITRAL VALVE REPAIR  August 2010    26 mm Sorin [facility] 3D Ring Annuloplasty  . THYROID SURGERY    Echo November 2015: EF 40-45%. Severe apical anterior, inferior and apical hypokinesis. Paradoxical septal motion. Moderate aortic regurgitation with moderate stenosis.   Current Meds  Medication Sig  . albuterol (PROVENTIL HFA;VENTOLIN HFA) 108 (90 BASE) MCG/ACT inhaler Inhale 2 puffs into the lungs 2 (two) times daily. Takes for shortness of breath  . aspirin 81 MG chewable tablet Chew 1 tablet by mouth daily.  [name] atorvastatin (LIPITOR) 10 MG tablet Take 1 tablet by mouth daily.  . beclomethasone (QVAR) 40 MCG/ACT inhaler Inhale 2 puffs into the lungs daily.   . carvedilol (COREG) 12.5 MG tablet Take 12.5 mg by mouth 2 (two) times daily with a meal.  . citalopram (CELEXA) 20 MG tablet Take 20 mg by mouth daily.  . cyclobenzaprine (FLEXERIL) 5 MG tablet Take 1 tablet (5 mg total) by mouth 3 (three) times daily as needed for muscle spasms (take for neck pain as needed).  . furosemide  (LASIX) 40 MG tablet Take 0.5 tablets (20 mg total) by mouth 2 (two) times daily.  . hydrocortisone (PROCTOZONE-HC) 2.5 % rectal cream Place 1 application rectally 2 (two) times daily.  . hydrocortisone 2.5 % cream Apply 1 application topically daily as needed.  [name] levothyroxine (SYNTHROID, LEVOTHROID) 112 MCG tablet Take 112 mcg by mouth daily.  [name] lisinopril (PRINIVIL,ZESTRIL) 10 MG tablet Take 10 mg by mouth daily.  . metolazone (ZAROXOLYN) 2.5 MG tablet Take 1 tablet (2.5 mg total) by mouth daily as needed.  . traMADol (ULTRAM) 50 MG tablet Take 50 mg by mouth every 6 (six) hours as needed for moderate pain.  [name] trimethoprim-polymyxin b (POLYTRIM) ophthalmic solution Place 2 drops into both eyes every 6 (six) hours.  . [DISCONTINUED] furosemide (LASIX) 40 MG tablet TAKE 1 TABLET(40 MG) BY MOUTH TWICE DAILY AS NEEDED    No Known Allergies  Social History   Social History  . Marital status: Single    Spouse name: N/A  . Number of children: N/A  . Years of education: N/A   Social History Main Topics  . Smoking status: Former Research scientist (life sciences)  . Smokeless tobacco: Never Used     Comment: quit 2011  . Alcohol use No  . Drug use: No  .  Sexual activity: Not Currently   Other Topics Concern  . None   Social History Narrative   Unemployed, on disability.   Now is finally reestablished with a PCP.   She is a former smoker, quit in 2011. Denies alcohol consumption   Tries to walk routinely.    family history includes CVA in her mother; Heart disease in her mother.  Wt Readings from Last 3 Encounters:  05/11/16 82.9 kg (182 lb 12.8 oz)  04/13/15 83.7 kg (184 lb 9.6 oz)  09/30/14 87 kg (191 lb 12.8 oz)    PHYSICAL EXAM BP 124/62   Pulse 68   Ht 4\' 9"  (1.448 m)   Wt 82.9 kg (182 lb 12.8 oz)   LMP 03/06/2012   BMI 39.56 kg/m  General appearance: alert, cooperative, appears stated age, no distress and moderately obese Neck: no adenopathy, no carotid bruit and no JVD Lungs: clear to  auscultation bilaterally, normal percussion bilaterally and non-labored Heart: RRR with ectopy, S1 & S2 normal, no murmur, click, rub or gallop; mildly displaced PMI  Abdomen: soft, non-tender; bowel sounds normal; no masses,  no organomegaly; mildly increased girth per her report Extremities: extremities normal, atraumatic, no cyanosis, and edema - trace Pulses: 2+ and symmetric;  Skin: mobility and turgor normal - keloid scar at the sternotomy site. There is no tenderness or pain along the sternal wire locations, but simply running my fingers softly across the skin lesions to extreme sensitivity. Neurologic: Mental status: Alert, oriented, thought content appropriate Cranial nerves: normal (II-XII grossly intact)    Adult ECG Report Not checked  Other studies Reviewed: Additional studies/ records that were reviewed today include:  Recent Labs:  PCP follows labs including Lipids  ASSESSMENT / PLAN: Problem List Items Addressed This Visit    Valvular cardiomyopathy: EF 40-45%, s/p Ao & MV Repair - Primary (Chronic)    Pretty stable symptoms. She does have occasional orthopnea symptoms to suggest increased volume overload. She has had take her a correct surgical date Lasix doses on occasion, and this is not sure when she is otherwise doing. We had tried having her take additional doses of Zaroxolyn, but she is not doing that.  He is on standing dose of carvedilol and lisinopril  Again discussed sliding scale Lasix. But I think for now we will have her take double dose of Lasix on Monday, Wednesday and Friday with additional doses when necessary on the other days.    Did not comment on use of Zaroxolyn, but this would be another option were she to call in with worsening dyspnea.  Plan: Recheck 2-D echocardiogram, and increase Lasix dosing.      Relevant Medications   aspirin 81 MG chewable tablet   lisinopril (PRINIVIL,ZESTRIL) 10 MG tablet   furosemide (LASIX) 40 MG tablet   Other  Relevant Orders   ECHOCARDIOGRAM COMPLETE   Chronic combined systolic and diastolic heart failure, NYHA class 2 (HCC) (Chronic)    Her symptoms of increased volume are usually orthopnea as well as increasing abdominal girth. Plan for now is to increase Lasix to a 40 twice a day on Monday Wednesday and Friday and additional dosing when necessary on other days. Also consider using an metolazone when necessary if Lasix is not working.  Discussed sliding scale.      Relevant Medications   aspirin 81 MG chewable tablet   lisinopril (PRINIVIL,ZESTRIL) 10 MG tablet   furosemide (LASIX) 40 MG tablet   S/P aortic valve repair (Chronic)  Due for echocardiogram to follow-up.  Plan: Order echo today.      Relevant Orders   ECHOCARDIOGRAM COMPLETE   S/P MVR (mitral valve repair) (Chronic)    Plan recheck echo      Relevant Orders   ECHOCARDIOGRAM COMPLETE   Essential hypertension (Chronic)    Adequate control on current regimen. We could consider switching from lisinopril to [facility] if symptoms get worse.      Relevant Medications   aspirin 81 MG chewable tablet   lisinopril (PRINIVIL,ZESTRIL) 10 MG tablet   furosemide (LASIX) 40 MG tablet   Other Relevant Orders   ECHOCARDIOGRAM COMPLETE   Hyperlipidemia with target LDL less than 100 (Chronic)   Relevant Medications   aspirin 81 MG chewable tablet   lisinopril (PRINIVIL,ZESTRIL) 10 MG tablet   furosemide (LASIX) 40 MG tablet   Other Relevant Orders   ECHOCARDIOGRAM COMPLETE   Severe obesity (BMI >= 40) (HCC) (Chronic)    The patient understands the need to lose weight with diet and exercise. We have discussed specific strategies for this.         Current medicines are reviewed at length with the patient today. (+/- concerns) n/a The following changes have been made: see below  3 d/weeik - go to BID Lasix & other days PRN.    Patient Instructions  Medication changes TAKE  2 TABLET  OF LASIX ON Monday-Wednesday- Friday  .  THE OTHER DAYS MAY TAKE AN EXTRA DOSE OF LASIX IF NEEDED.   SCHEDULE AT [facility] has requested that you have an echocardiogram. Echocardiography is a painless test that uses sound waves to create images of your heart. It provides your doctor with information about the size and shape of your heart and how well your heart's chambers and valves are working. This procedure takes approximately one hour. There are no restrictions for this procedure.   TALK WITH PRIMARY - STARTING NEURONTIN FOR SCAR PAIN   Your physician wants you to follow-up in:12 MONTHS WITH DR Raygan Skarda. You will receive a reminder letter in the mail two months in advance. If you don't receive a letter, please call our office to schedule the follow-up appointment.  If you need a refill on your cardiac medications before your next appointment, please call your pharmacy.    Studies Ordered:   Orders Placed This Encounter  Procedures  . ECHOCARDIOGRAM COMPLETE      Glenetta Hew, M.D., M.S. Interventional Cardiologist   Pager # [phone] Phone # [phone] [address] [place], [place] [address]

## 2016-05-11 NOTE — Patient Instructions (Signed)
Medication changes TAKE  2 TABLET  OF LASIX ON Monday-Wednesday- Friday .  THE OTHER DAYS MAY TAKE AN EXTRA DOSE OF LASIX IF NEEDED.   SCHEDULE AT [facility] has requested that you have an echocardiogram. Echocardiography is a painless test that uses sound waves to create images of your heart. It provides your doctor with information about the size and shape of your heart and how well your heart's chambers and valves are working. This procedure takes approximately one hour. There are no restrictions for this procedure.   TALK WITH PRIMARY - STARTING NEURONTIN FOR SCAR PAIN   Your physician wants you to follow-up in:12 MONTHS WITH DR HARDING. You will receive a reminder letter in the mail two months in advance. If you don't receive a letter, please call our office to schedule the follow-up appointment.  If you need a refill on your cardiac medications before your next appointment, please call your pharmacy.

## 2016-05-13 ENCOUNTER — Encounter: Payer: Self-pay | Admitting: Cardiology

## 2016-05-13 NOTE — Assessment & Plan Note (Signed)
Her symptoms of increased volume are usually orthopnea as well as increasing abdominal girth. Plan for now is to increase Lasix to a 40 twice a day on Monday Wednesday and Friday and additional dosing when necessary on other days. Also consider using an metolazone when necessary if Lasix is not working.  Discussed sliding scale.

## 2016-05-13 NOTE — Assessment & Plan Note (Signed)
Adequate control on current regimen. We could consider switching from lisinopril to [facility] if symptoms get worse.

## 2016-05-13 NOTE — Assessment & Plan Note (Signed)
Pretty stable symptoms. She does have occasional orthopnea symptoms to suggest increased volume overload. She has had take her a correct surgical date Lasix doses on occasion, and this is not sure when she is otherwise doing. We had tried having her take additional doses of Zaroxolyn, but she is not doing that.  He is on standing dose of carvedilol and lisinopril  Again discussed sliding scale Lasix. But I think for now we will have her take double dose of Lasix on Monday, Wednesday and Friday with additional doses when necessary on the other days.    Did not comment on use of Zaroxolyn, but this would be another option were she to call in with worsening dyspnea.  Plan: Recheck 2-D echocardiogram, and increase Lasix dosing.

## 2016-05-13 NOTE — Assessment & Plan Note (Signed)
Due for echocardiogram to follow-up.  Plan: Order echo today.

## 2016-05-13 NOTE — Assessment & Plan Note (Signed)
The patient understands the need to lose weight with diet and exercise. We have discussed specific strategies for this.  

## 2016-05-13 NOTE — Assessment & Plan Note (Signed)
Plan recheck echo

## 2016-05-23 DIAGNOSIS — I272 Pulmonary hypertension, unspecified: Secondary | ICD-10-CM

## 2016-05-23 HISTORY — PX: TRANSTHORACIC ECHOCARDIOGRAM: SHX275

## 2016-05-23 HISTORY — DX: Pulmonary hypertension, unspecified: I27.20

## 2016-06-01 ENCOUNTER — Other Ambulatory Visit (HOSPITAL_COMMUNITY): Payer: Medicaid Other

## 2016-06-14 ENCOUNTER — Other Ambulatory Visit (HOSPITAL_COMMUNITY): Payer: Medicaid Other

## 2016-06-15 ENCOUNTER — Other Ambulatory Visit: Payer: Self-pay

## 2016-06-15 ENCOUNTER — Ambulatory Visit (HOSPITAL_COMMUNITY): Payer: Medicaid Other | Attending: Cardiology

## 2016-06-15 DIAGNOSIS — I340 Nonrheumatic mitral (valve) insufficiency: Secondary | ICD-10-CM | POA: Diagnosis not present

## 2016-06-15 DIAGNOSIS — I501 Left ventricular failure: Secondary | ICD-10-CM | POA: Insufficient documentation

## 2016-06-15 DIAGNOSIS — E785 Hyperlipidemia, unspecified: Secondary | ICD-10-CM | POA: Diagnosis not present

## 2016-06-15 DIAGNOSIS — R9439 Abnormal result of other cardiovascular function study: Secondary | ICD-10-CM | POA: Insufficient documentation

## 2016-06-15 DIAGNOSIS — I350 Nonrheumatic aortic (valve) stenosis: Secondary | ICD-10-CM | POA: Diagnosis not present

## 2016-06-15 DIAGNOSIS — I10 Essential (primary) hypertension: Secondary | ICD-10-CM | POA: Insufficient documentation

## 2016-06-15 DIAGNOSIS — I428 Other cardiomyopathies: Secondary | ICD-10-CM | POA: Insufficient documentation

## 2016-06-15 DIAGNOSIS — I272 Pulmonary hypertension, unspecified: Secondary | ICD-10-CM | POA: Insufficient documentation

## 2016-06-15 DIAGNOSIS — Z9889 Other specified postprocedural states: Secondary | ICD-10-CM | POA: Insufficient documentation

## 2016-06-21 ENCOUNTER — Telehealth: Payer: Self-pay | Admitting: Cardiology

## 2016-06-21 NOTE — Telephone Encounter (Signed)
F/u message ° °Pt returning RN call about echo results. Please call back to discuss  °

## 2016-06-21 NOTE — Telephone Encounter (Signed)
Per result note: Echo results are mildly concerning.- Ejection fraction has gone down to 25-30% from 3540%. This is a significant change with regional wall motion abnormality.  The valves themselves look relatively stable, but the aortic valve now appears to have moderate stenosis as well as moderate regurgitation.  I think we need to see her back as soon as possible rather than wait next year in order to discuss next step which would probably be doing an ischemic evaluation and possibly a TEE.  Glenetta Hew, MD  Pt notified of above result and appt scheduled to discuss 07-04-16 @1015am 

## 2016-07-04 ENCOUNTER — Encounter: Payer: Self-pay | Admitting: Cardiology

## 2016-07-04 ENCOUNTER — Ambulatory Visit (INDEPENDENT_AMBULATORY_CARE_PROVIDER_SITE_OTHER): Payer: Medicaid Other | Admitting: Cardiology

## 2016-07-04 VITALS — BP 120/70 | HR 53 | Ht <= 58 in | Wt 185.0 lb

## 2016-07-04 DIAGNOSIS — I209 Angina pectoris, unspecified: Secondary | ICD-10-CM | POA: Diagnosis not present

## 2016-07-04 DIAGNOSIS — R0609 Other forms of dyspnea: Secondary | ICD-10-CM

## 2016-07-04 DIAGNOSIS — I10 Essential (primary) hypertension: Secondary | ICD-10-CM

## 2016-07-04 DIAGNOSIS — Z9889 Other specified postprocedural states: Secondary | ICD-10-CM

## 2016-07-04 DIAGNOSIS — Z01818 Encounter for other preprocedural examination: Secondary | ICD-10-CM | POA: Diagnosis not present

## 2016-07-04 DIAGNOSIS — I428 Other cardiomyopathies: Secondary | ICD-10-CM

## 2016-07-04 DIAGNOSIS — I5042 Chronic combined systolic (congestive) and diastolic (congestive) heart failure: Secondary | ICD-10-CM

## 2016-07-04 DIAGNOSIS — E785 Hyperlipidemia, unspecified: Secondary | ICD-10-CM

## 2016-07-04 MED ORDER — LOSARTAN POTASSIUM 50 MG PO TABS: 50.0000 mg | ORAL_TABLET | Freq: Every day | ORAL | 3 refills | Status: DC

## 2016-07-04 NOTE — Progress Notes (Signed)
PCP: Aura Dials, PA-C  Clinic Note: Chief Complaint  Patient presents with  . Cardiac Valve Problem    follow up echo  . Cardiomyopathy    HPI: [name] is a 56 y.o. 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 At that time she just had a follow-up echo showing reduced EF. No other significant symptoms.  Recent Hospitalizations: none  Studies Reviewed:   2-D echo 06/16/2016: EF 25-30%. Moderate aortic stenosis with moderate regurgitation.  Interval History: She presents today earlier than originally scheduled because of the notably reduced EF on her echocardiogram that was done for follow-up. She also has progression of her valvular disease as well. She says that she has noticed that her dyspnea has gotten a lot worse. She simply gets short of breath walking on the corner. She knows that her heart rate goes up and this is what really makes her dyspneic. Even if she gets anxious and her heart rate goes up, she gets short of breath. She has not noted any chest tightness or pressure however. She has actually continued to take twice a day 40 mg Lasix, and states that she's not really have not much the way of orthopnea and PND as long as she has been doing that. Edema is well-controlled.   Besides the fact her heart rate gets up very quickly with minimal exertion, she denies any significant palpitations. When she is short of breath she does note some intermittent lightheadedness, dizziness and weakness, but no notable syncope/near syncope. No TIA/amaurosis fugax symptoms.  No claudication - she really doesn't walk around enough to get claudication  ROS: A comprehensive was performed. Review of Systems  Constitutional: Negative for malaise/fatigue.  HENT: Negative for congestion, nosebleeds and sinus pain.   Respiratory: Positive  for cough (dry cough ), shortness of breath and wheezing (More so than she had before).   Gastrointestinal: Negative for abdominal pain, blood in stool, heartburn and melena.  Musculoskeletal: Positive for joint pain.       Sensitive sternal keloid scar  Skin: Negative.   Neurological: Positive for dizziness (When dyspneic).  Psychiatric/Behavioral: Negative for depression and memory loss. The patient is not nervous/anxious and does not have insomnia.   All other systems reviewed and are negative.   Past Medical History:  Diagnosis Date  . Chronic combined systolic and diastolic heart failure, NYHA class 2 ([place])   . Dementia    Early onset  . Dysthymia   . Essential hypertension 12/16/2013  . H/O Takotsubo cardiomyopathy    with long-standing combinted chronic systolic & diastolic HF.  [name] History of uterine fibroid   . Hx of thyroid cancer 12/16/2013   Status post thyroidectomy  . Hyperlipidemia with target LDL less than 100 12/16/2013  . Hypothyroidism 12/16/2013  . 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 RAD (reactive airway disease)   . S/P MVR (mitral valve repair) 12/16/2013   26 mm Sorin MEMO 3D Ring Annuloplasty  . 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 Roxy Manns  Echo 12/2010: EF 40-45%, inferior hypokinesis. Slightly increased transaortic velocity = mild AS with mild to moderate AI normal MV gradients with no MR. Significantly improved PA pressures. Paradoxical septal motion.  Past Surgical History:  Procedure Laterality Date  . AORTIC VALVE REPAIR  August [id number]   Suture plication of all 3 commissures  . CARDIAC CATHETERIZATION  August 2010   Preop: nonobstructive coronary disease  . CARDIAC SURGERY  12/2008   Aortic & Mitral Valve Repair (Dr. Roxy Manns)  . CESAREAN SECTION    . DOPPLER ECHOCARDIOGRAPHY  May 2012    EF 40-45%, inferior hypokinesis.  Slightly increased transaortic velocity = mild AS with mild to moderate AI normal MV gradients with no MR. Significantly improved PA pressures. Paradoxical septal motion.  [name] MITRAL VALVE REPAIR  August 2010    26 mm Sorin [facility] 3D Ring Annuloplasty  . THYROID SURGERY    . TRANSTHORACIC ECHOCARDIOGRAM  05/2016   EF 25-30%. Moderate aortic stenosis with moderate regurgitation.    Current Meds  Medication Sig  . albuterol (PROVENTIL HFA;VENTOLIN HFA) 108 (90 BASE) MCG/ACT inhaler Inhale 2 puffs into the lungs 2 (two) times daily. Takes for shortness of breath  . aspirin 81 MG chewable tablet Chew 1 tablet by mouth daily.  [name] atorvastatin (LIPITOR) 10 MG tablet Take 1 tablet by mouth daily.  . beclomethasone (QVAR) 40 MCG/ACT inhaler Inhale 2 puffs into the lungs daily.   . carvedilol (COREG) 12.5 MG tablet Take 12.5 mg by mouth 2 (two) times daily with a meal.  . citalopram (CELEXA) 20 MG tablet Take 20 mg by mouth daily.  . cyclobenzaprine (FLEXERIL) 5 MG tablet Take 1 tablet (5 mg total) by mouth 3 (three) times daily as needed for muscle spasms (take for neck pain as needed).  . furosemide (LASIX) 40 MG tablet Take 0.5 tablets (20 mg total) by mouth 2 (two) times daily.  . hydrocortisone (PROCTOZONE-HC) 2.5 % rectal cream Place 1 application rectally 2 (two) times daily.  . hydrocortisone 2.5 % cream Apply 1 application topically daily as needed.  [name] levothyroxine (SYNTHROID, LEVOTHROID) 112 MCG tablet Take 112 mcg by mouth daily.  . metolazone (ZAROXOLYN) 2.5 MG tablet Take 1 tablet (2.5 mg total) by mouth daily as needed.  . traMADol (ULTRAM) 50 MG tablet Take 50 mg by mouth every 6 (six) hours as needed for moderate pain.  [name] trimethoprim-polymyxin b (POLYTRIM) ophthalmic solution Place 2 drops into both eyes every 6 (six) hours.  . [DISCONTINUED] lisinopril (PRINIVIL,ZESTRIL) 10 MG tablet Take 10 mg by mouth daily.    No Known Allergies  Social History   Social History  . Marital  status: Single    Spouse name: N/A  . Number of children: N/A  . Years of education: N/A   Social History Main Topics  . Smoking status: Former Research scientist (life sciences)  . Smokeless tobacco: Never Used     Comment: quit 2011  . Alcohol use No  . Drug use: No  . Sexual activity: Not Currently   Other Topics Concern  . None   Social History Narrative   Unemployed, on disability.   Now is finally reestablished with a PCP.   She is a former smoker, quit in 2011. Denies alcohol consumption   Tries to walk routinely.    family history includes CVA in her mother; Heart disease in her mother.  Wt Readings from Last 3 Encounters:  07/04/16 83.9 kg (185 lb)  05/11/16 82.9 kg (182 lb 12.8 oz)  04/13/15 83.7 kg (184 lb 9.6 oz)    PHYSICAL EXAM BP 120/70   Pulse (!) 53   Ht 4\' 9"  (1.448 m)   Wt 83.9 kg (185 lb)  LMP 03/06/2012   BMI 40.03 kg/m  General appearance: alert, cooperative, appears stated age, no distress and moderately obese Neck: no adenopathy, no carotid bruit and no JVD Lungs: clear to auscultation bilaterally, normal percussion bilaterally and non-labored Heart: RRR with ectopy, S1 &S2 normal, no click, rub or gallop; mildly displaced PMI.  She has both a 3/6 SEM at RUSB radiating to carotids as well as a 1-2/6 DM at LUSB Abdomen: soft, non-tender; bowel sounds normal; no masses, no organomegaly; no longer noticing increased abdominal girth. Extremities: extremities normal, atraumatic, no cyanosis, and edema - trace Pulses: 2+ and symmetric;  Skin: mobility and turgor normal - keloid scar at the sternotomy site. There is no tenderness or pain along the sternal wire locations, but simply running my fingers softly across the skin lesions to extreme sensitivity. 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  Regimen discussed in heart failure section      Relevant Medications   rosuvastatin (CRESTOR) 20 MG tablet   losartan (COZAAR) 50 MG tablet   Other Relevant Orders   TRANSESOPHAGEAL ECHOCARDIOGRAM W/O CARDIOVERSION   LEFT AND RIGHT HEART CATHETERIZATION WITH CORONARY ANGIOGRAM   Chronic combined systolic and diastolic heart failure, NYHA class 2 (HCC) (Chronic)    Still showing signs of volume overload, but much improved with increased dose of Lasix. With worsening EF and valvular regurgitation/stenosis, I think we need to evaluate for ischemia as well as worsening valve disease.  Plan: TEE as well as Right & Left Heart Catheterization. She is on a good dose of carvedilol and standing dose of Lasix 40 mg twice a day. I will switch her from lisinopril to losartan given her cough. Still using when necessary Zaroxolyn maybe 3 times a week.      Relevant Medications   rosuvastatin (CRESTOR) 20 MG tablet   losartan (COZAAR) 50 MG tablet   S/P aortic valve repair (Chronic)    Moderate stenosis and regurgitation noted on recent echo. Plan TEE and R&LHC      Essential hypertension (Chronic)    Relatively well-controlled. I'm switching her to ARB now from lisinopril because of her cough. This will then allow [place] to potentially convert to [facility] depending on what the cardiac catheterization and TEE show.  On stable dose of carvedilol.      Relevant Medications   rosuvastatin (CRESTOR) 20 MG tablet   losartan (COZAAR) 50 MG tablet   Hyperlipidemia with target LDL less than 100 (Chronic)    On standing dose of statin. Followed by PCP.      Relevant Medications   rosuvastatin (CRESTOR) 20 MG tablet    losartan (COZAAR) 50 MG tablet   Angina, class II (HCC)    I'm concerned that her significant dyspnea and occasional tightness in her chest associated with it is potentially anginal equivalent. This is either related to angina from coronary disease or from elevated LVEDP.  Right and left heart catheterization with possible PCI.  Procedure:  Right and Left Heart Catheterization with possible Percutaneous Coronary Intervention  The procedure with  Risks/Benefits/Alternatives and Indications was reviewed with the patient and her daughter.  All questions were answered.    Risks / Complications include, but not limited to: Death, MI, CVA/TIA, VF/VT (with defibrillation), Bradycardia (need for temporary pacer placement), contrast induced nephropathy, bleeding / bruising / hematoma / pseudoaneurysm, vascular or coronary injury (with possible emergent CT or Vascular Surgery), adverse medication reactions, infection.  Additional risks involving the use of radiation with the possibility of radiation burns and cancer were explained in detail.  The patient (and family) voice understanding and agree to proceed.         Relevant Medications   rosuvastatin (CRESTOR) 20 MG tablet   losartan (COZAAR) 50 MG tablet   Other Relevant Orders   LEFT AND RIGHT HEART CATHETERIZATION WITH CORONARY ANGIOGRAM    Other Visit Diagnoses    Exertional dyspnea       Relevant Orders   TRANSESOPHAGEAL ECHOCARDIOGRAM W/O CARDIOVERSION   LEFT AND RIGHT HEART CATHETERIZATION WITH CORONARY ANGIOGRAM   Pre-procedural examination       Relevant Orders   Basic metabolic panel (Completed)   CBC (Completed)   DG Chest 2 View (Completed)      Current medicines are reviewed at length with the patient today. (+/- concerns) - taking 40 mg Lasix BID The following changes have been made: see below  Patient Instructions  Your physician has recommended you make the following change in your medication:  -- STOP lisinopril --  START losartan 50mg  once daily   Your physician has requested that you have a right & left cardiac catheterization @ [facility] w/Dr. Ellyn Hack. Cardiac catheterization is used to diagnose and/or treat various heart conditions. Doctors may recommend this procedure for a number of different reasons. The most common reason is to evaluate chest pain. Chest pain can be a symptom of coronary artery disease (CAD), and cardiac catheterization can show whether plaque is narrowing or blocking your heart's arteries. This procedure is also used to evaluate the valves, as well as measure the blood flow and oxygen levels in different parts of your heart. For further information please visit [email or web address]. Please follow instruction sheet, as given.  Following your catheterization, you will not be allowed to drive for 3 days.  No lifting, pushing, or pulling greater that 10 pounds is allowed for 1 week.  Your physician has requested that you have a TEE with Dr. Debara Pickett or Dr. Sallyanne Kuster (please schedule same day as cath if possible). During a TEE, sound waves are used to create images of your heart. It provides your doctor with information about the size and shape of your heart and how well your heart's chambers and valves are working. In this test, a transducer is attached to the end of a flexible tube that's guided down your throat and into your esophagus (the tube leading from you mouth to your stomach) to get a more detailed image of your heart. You are not awake for the procedure. Please see the instruction sheet given to you today. For further information please visit [email or web address].  You will be required to have the following tests prior to the procedure:  1. Blood work - the blood work can be done no more than 14 days prior to the procedure.  It can be done at any [facility] lab. There is a lab downstairs on the first floor of this building in [address] and one at [address] [place] [address].  2.  Chest X-ray - this can be done at  [facility] in the [employer] @ [address]   Your physician recommends that you schedule a follow-up appointment with Dr. Ellyn Hack after your procedures.   Studies Ordered:   Orders Placed This Encounter  Procedures  . DG Chest 2 View  . Basic metabolic panel  . CBC  . TRANSESOPHAGEAL ECHOCARDIOGRAM W/O CARDIOVERSION  . LEFT AND RIGHT HEART CATHETERIZATION WITH CORONARY Illene Silver, M.D., M.S. Interventional Cardiologist   Pager # [phone] Phone # [phone] [address]. [place], [place] [address]

## 2016-07-04 NOTE — Patient Instructions (Addendum)
Your physician has recommended you make the following change in your medication:  -- STOP lisinopril -- START losartan 50mg  once daily   Your physician has requested that you have a right & left cardiac catheterization @ [facility] w/Dr. Ellyn Hack. Cardiac catheterization is used to diagnose and/or treat various heart conditions. Doctors may recommend this procedure for a number of different reasons. The most common reason is to evaluate chest pain. Chest pain can be a symptom of coronary artery disease (CAD), and cardiac catheterization can show whether plaque is narrowing or blocking your heart's arteries. This procedure is also used to evaluate the valves, as well as measure the blood flow and oxygen levels in different parts of your heart. For further information please visit [email or web address]. Please follow instruction sheet, as given.  Following your catheterization, you will not be allowed to drive for 3 days.  No lifting, pushing, or pulling greater that 10 pounds is allowed for 1 week.  Your physician has requested that you have a TEE with Dr. Debara Pickett or Dr. Sallyanne Kuster (please schedule same day as cath if possible). During a TEE, sound waves are used to create images of your heart. It provides your doctor with information about the size and shape of your heart and how well your heart's chambers and valves are working. In this test, a transducer is attached to the end of a flexible tube that's guided down your throat and into your esophagus (the tube leading from you mouth to your stomach) to get a more detailed image of your heart. You are not awake for the procedure. Please see the instruction sheet given to you today. For further information please visit [email or web address].  You will be required to have the following tests prior to the procedure:  1. Blood work - the blood work can be done no more than 14 days prior to the procedure.  It can be done at any [facility] lab. There is a lab  downstairs on the first floor of this building in [address] and one at [place] [address].  2. Chest X-ray - this can be done at [place] in the [place] @ [address]   Your physician recommends that you schedule a follow-up appointment with Dr. Ellyn Hack after your procedures.

## 2016-07-05 ENCOUNTER — Ambulatory Visit
Admission: RE | Admit: 2016-07-05 | Discharge: 2016-07-05 | Disposition: A | Discharge status: Home / Self Care | Payer: Medicaid Other | Source: Ambulatory Visit | Attending: Cardiology | Admitting: Cardiology

## 2016-07-05 ENCOUNTER — Encounter: Payer: Self-pay | Admitting: Cardiology

## 2016-07-05 DIAGNOSIS — Z01818 Encounter for other preprocedural examination: Secondary | ICD-10-CM

## 2016-07-05 DIAGNOSIS — I209 Angina pectoris, unspecified: Secondary | ICD-10-CM | POA: Insufficient documentation

## 2016-07-05 LAB — BASIC METABOLIC PANEL
BUN: 15 mg/dL (ref 7–25)
CHLORIDE: 103 mmol/L (ref 98–110)
CO2: 27 mmol/L (ref 20–31)
CREATININE: 1.09 mg/dL — AB (ref 0.50–1.05)
Calcium: 8.8 mg/dL (ref 8.6–10.4)
Glucose, Bld: 98 mg/dL (ref 65–99)
POTASSIUM: 3.8 mmol/L (ref 3.5–5.3)
Sodium: 140 mmol/L (ref 135–146)

## 2016-07-05 LAB — CBC
HCT: 39.6 % (ref 35.0–45.0)
HEMOGLOBIN: 12.7 g/dL (ref 11.7–15.5)
MCH: 27.4 pg (ref 27.0–33.0)
MCHC: 32.1 g/dL (ref 32.0–36.0)
MCV: 85.3 fL (ref 80.0–100.0)
MPV: 10.2 fL (ref 7.5–12.5)
Platelets: 180 10*3/uL (ref 140–400)
RBC: 4.64 MIL/uL (ref 3.80–5.10)
RDW: 16.3 % — AB (ref 11.0–15.0)
WBC: 6.8 10*3/uL (ref 3.8–10.8)

## 2016-07-05 NOTE — Assessment & Plan Note (Signed)
Relatively well-controlled. I'm switching her to ARB now from lisinopril because of her cough. This will then allow [place] to potentially convert to [facility] depending on what the cardiac catheterization and TEE show.  On stable dose of carvedilol.

## 2016-07-05 NOTE — Assessment & Plan Note (Addendum)
Moderate stenosis and regurgitation noted on recent echo. Plan TEE and R&LHC

## 2016-07-05 NOTE — Assessment & Plan Note (Signed)
Still showing signs of volume overload, but much improved with increased dose of Lasix. With worsening EF and valvular regurgitation/stenosis, I think we need to evaluate for ischemia as well as worsening valve disease.  Plan: TEE as well as Right & Left Heart Catheterization. She is on a good dose of carvedilol and standing dose of Lasix 40 mg twice a day. I will switch her from lisinopril to losartan given her cough. Still using when necessary Zaroxolyn maybe 3 times a week.

## 2016-07-05 NOTE — Assessment & Plan Note (Signed)
With worsening EF, plan right and left heart catheterization as well as TEE.  Regimen discussed in heart failure section

## 2016-07-05 NOTE — Assessment & Plan Note (Signed)
On standing dose of statin. Followed by PCP.

## 2016-07-05 NOTE — Assessment & Plan Note (Signed)
I'm concerned that her significant dyspnea and occasional tightness in her chest associated with it is potentially anginal equivalent. This is either related to angina from coronary disease or from elevated LVEDP.  Right and left heart catheterization with possible PCI.  Procedure:  Right and Left Heart Catheterization with possible Percutaneous Coronary Intervention  The procedure with Risks/Benefits/Alternatives and Indications was reviewed with the patient and her daughter.  All questions were answered.    Risks / Complications include, but not limited to: Death, MI, CVA/TIA, VF/VT (with defibrillation), Bradycardia (need for temporary pacer placement), contrast induced nephropathy, bleeding / bruising / hematoma / pseudoaneurysm, vascular or coronary injury (with possible emergent CT or Vascular Surgery), adverse medication reactions, infection.  Additional risks involving the use of radiation with the possibility of radiation burns and cancer were explained in detail.  The patient (and family) voice understanding and agree to proceed.

## 2016-07-06 ENCOUNTER — Telehealth: Payer: Self-pay | Admitting: Cardiology

## 2016-07-06 ENCOUNTER — Other Ambulatory Visit: Payer: Self-pay | Admitting: *Deleted

## 2016-07-06 DIAGNOSIS — I428 Other cardiomyopathies: Secondary | ICD-10-CM

## 2016-07-06 DIAGNOSIS — I209 Angina pectoris, unspecified: Secondary | ICD-10-CM

## 2016-07-06 DIAGNOSIS — I38 Endocarditis, valve unspecified: Secondary | ICD-10-CM

## 2016-07-06 DIAGNOSIS — K641 Second degree hemorrhoids: Secondary | ICD-10-CM | POA: Insufficient documentation

## 2016-07-06 DIAGNOSIS — Z9889 Other specified postprocedural states: Secondary | ICD-10-CM

## 2016-07-06 DIAGNOSIS — Z01810 Encounter for preprocedural cardiovascular examination: Secondary | ICD-10-CM

## 2016-07-06 NOTE — Telephone Encounter (Signed)
Medication list clarified, pt advised to stop the lisinopril and start the losartan. Also I clarified which statin she was taking (atorvastatin still in her list). She's taking crestor 20mg  and does not have lipitor. Updated med list accordingly. Pt voiced understanding of instructions, aware to call if further needs or concerns.

## 2016-07-06 NOTE — Telephone Encounter (Signed)
New message    Pt c/o medication issue:  1. Name of Medication: rosuvastatin 20 mg  2. How are you currently taking this medication (dosage and times per day)? Once daily  3. Are you having a reaction (difficulty breathing--STAT)? no  4. What is your medication issue? When pt saw dr. Ellyn Hack, she was told to stop taking one of her medications. She is calling to find out which one it was.

## 2016-07-07 ENCOUNTER — Telehealth: Payer: Self-pay | Admitting: Cardiology

## 2016-07-07 NOTE — Telephone Encounter (Signed)
Request for surgical clearance:  1. What type of surgery is being performed? Hemorrhoids   2. When is this surgery scheduled? Not scheduled   3. Are there any medications that need to be held prior to surgery and how long?General Cardiac Clearance   4. Name of physician performing surgery? Dr Randolm Idol   5. What is your office phone and fax number? [phone] and fax is [phone]

## 2016-07-07 NOTE — Telephone Encounter (Signed)
Left message on secure voicemail  patient will be having  left and right cardiac cath on 07/08/16 and TEE 07/13/16. Will be able to  answer clearance after procedures are completed.  Will defer to Dr Ellyn Hack

## 2016-07-08 ENCOUNTER — Encounter (HOSPITAL_COMMUNITY)
Admission: RE | Disposition: A | Discharge status: Home / Self Care | Payer: Self-pay | Source: Ambulatory Visit | Attending: Cardiology

## 2016-07-08 ENCOUNTER — Other Ambulatory Visit: Payer: Self-pay

## 2016-07-08 ENCOUNTER — Ambulatory Visit (HOSPITAL_COMMUNITY)
Admission: RE | Admit: 2016-07-08 | Discharge: 2016-07-08 | Disposition: A | Discharge status: Home / Self Care | Payer: Medicaid Other | Source: Ambulatory Visit | Attending: Cardiology | Admitting: Cardiology

## 2016-07-08 DIAGNOSIS — E039 Hypothyroidism, unspecified: Secondary | ICD-10-CM | POA: Diagnosis not present

## 2016-07-08 DIAGNOSIS — I428 Other cardiomyopathies: Secondary | ICD-10-CM | POA: Diagnosis not present

## 2016-07-08 DIAGNOSIS — I209 Angina pectoris, unspecified: Secondary | ICD-10-CM | POA: Diagnosis not present

## 2016-07-08 DIAGNOSIS — Z823 Family history of stroke: Secondary | ICD-10-CM | POA: Insufficient documentation

## 2016-07-08 DIAGNOSIS — I5043 Acute on chronic combined systolic (congestive) and diastolic (congestive) heart failure: Secondary | ICD-10-CM | POA: Diagnosis present

## 2016-07-08 DIAGNOSIS — Z7982 Long term (current) use of aspirin: Secondary | ICD-10-CM | POA: Insufficient documentation

## 2016-07-08 DIAGNOSIS — J45909 Unspecified asthma, uncomplicated: Secondary | ICD-10-CM | POA: Diagnosis not present

## 2016-07-08 DIAGNOSIS — Z87891 Personal history of nicotine dependence: Secondary | ICD-10-CM | POA: Diagnosis not present

## 2016-07-08 DIAGNOSIS — Z953 Presence of xenogenic heart valve: Secondary | ICD-10-CM | POA: Insufficient documentation

## 2016-07-08 DIAGNOSIS — I5042 Chronic combined systolic (congestive) and diastolic (congestive) heart failure: Secondary | ICD-10-CM | POA: Diagnosis present

## 2016-07-08 DIAGNOSIS — E785 Hyperlipidemia, unspecified: Secondary | ICD-10-CM | POA: Insufficient documentation

## 2016-07-08 DIAGNOSIS — Z8249 Family history of ischemic heart disease and other diseases of the circulatory system: Secondary | ICD-10-CM | POA: Diagnosis not present

## 2016-07-08 DIAGNOSIS — I110 Hypertensive heart disease with heart failure: Secondary | ICD-10-CM | POA: Diagnosis not present

## 2016-07-08 DIAGNOSIS — I099 Rheumatic heart disease, unspecified: Secondary | ICD-10-CM

## 2016-07-08 DIAGNOSIS — Z9889 Other specified postprocedural states: Secondary | ICD-10-CM

## 2016-07-08 DIAGNOSIS — F0390 Unspecified dementia without behavioral disturbance: Secondary | ICD-10-CM | POA: Insufficient documentation

## 2016-07-08 HISTORY — PX: RIGHT/LEFT HEART CATH AND CORONARY ANGIOGRAPHY: CATH118266

## 2016-07-08 LAB — POCT I-STAT 3, VENOUS BLOOD GAS (G3P V)
ACID-BASE EXCESS: 3 mmol/L — AB (ref 0.0–2.0)
ACID-BASE EXCESS: 3 mmol/L — AB (ref 0.0–2.0)
BICARBONATE: 29.6 mmol/L — AB (ref 20.0–28.0)
Bicarbonate: 29.5 mmol/L — ABNORMAL HIGH (ref 20.0–28.0)
O2 SAT: 59 %
O2 SAT: 58 %
PH VEN: 7.366 (ref 7.250–7.430)
PH VEN: 7.371 (ref 7.250–7.430)
PO2 VEN: 32 mmHg (ref 32.0–45.0)
TCO2: 31 mmol/L (ref 0–100)
TCO2: 31 mmol/L (ref 0–100)
pCO2, Ven: 51.8 mmHg (ref 44.0–60.0)
pCO2, Ven: 50.9 mmHg (ref 44.0–60.0)
pO2, Ven: 33 mmHg (ref 32.0–45.0)

## 2016-07-08 LAB — PROTIME-INR
INR: 1.06
Prothrombin Time: 13.9 seconds (ref 11.4–15.2)

## 2016-07-08 LAB — POCT I-STAT 3, ART BLOOD GAS (G3+)
Acid-Base Excess: 6 mmol/L — ABNORMAL HIGH (ref 0.0–2.0)
Bicarbonate: 31.6 mmol/L — ABNORMAL HIGH (ref 20.0–28.0)
O2 SAT: 98 %
PCO2 ART: 46.8 mmHg (ref 32.0–48.0)
TCO2: 33 mmol/L (ref 0–100)
pH, Arterial: 7.436 (ref 7.350–7.450)
pO2, Arterial: 96 mmHg (ref 83.0–108.0)

## 2016-07-08 SURGERY — RIGHT/LEFT HEART CATH AND CORONARY ANGIOGRAPHY

## 2016-07-08 MED ORDER — SODIUM CHLORIDE 0.9% FLUSH: 3.0000 mL | INTRAVENOUS | Status: DC | PRN

## 2016-07-08 MED ORDER — SODIUM CHLORIDE 0.9% FLUSH: 3.0000 mL | Freq: Two times a day (BID) | INTRAVENOUS | Status: DC

## 2016-07-08 MED ORDER — FENTANYL CITRATE (PF) 100 MCG/2ML IJ SOLN
INTRAMUSCULAR | Status: AC
  Filled 2016-07-08: qty 2

## 2016-07-08 MED ORDER — ONDANSETRON HCL 4 MG/2ML IJ SOLN: 4.0000 mg | Freq: Four times a day (QID) | INTRAMUSCULAR | Status: DC | PRN

## 2016-07-08 MED ORDER — LIDOCAINE HCL (PF) 1 % IJ SOLN
INTRAMUSCULAR | Status: AC
  Filled 2016-07-08: qty 30

## 2016-07-08 MED ORDER — ACETAMINOPHEN 325 MG PO TABS: 650.0000 mg | ORAL_TABLET | ORAL | Status: DC | PRN

## 2016-07-08 MED ORDER — VERAPAMIL HCL 2.5 MG/ML IV SOLN
INTRAVENOUS | Status: AC
  Filled 2016-07-08: qty 2

## 2016-07-08 MED ORDER — SODIUM CHLORIDE 0.9 % IV SOLN: 250.0000 mL | INTRAVENOUS | Status: DC | PRN

## 2016-07-08 MED ORDER — IOPAMIDOL (ISOVUE-370) INJECTION 76%
INTRAVENOUS | Status: DC | PRN
  Administered 2016-07-08: 65 mL via INTRA_ARTERIAL

## 2016-07-08 MED ORDER — HEPARIN (PORCINE) IN NACL 2-0.9 UNIT/ML-% IJ SOLN
INTRAMUSCULAR | Status: AC
  Filled 2016-07-08: qty 1000

## 2016-07-08 MED ORDER — ASPIRIN 81 MG PO CHEW: 81.0000 mg | CHEWABLE_TABLET | ORAL | Status: AC

## 2016-07-08 MED ORDER — FENTANYL CITRATE (PF) 100 MCG/2ML IJ SOLN
INTRAMUSCULAR | Status: DC | PRN
  Administered 2016-07-08: 25 ug via INTRAVENOUS

## 2016-07-08 MED ORDER — LIDOCAINE HCL (PF) 1 % IJ SOLN
INTRAMUSCULAR | Status: DC | PRN
  Administered 2016-07-08: 22 mL via INTRADERMAL

## 2016-07-08 MED ORDER — MIDAZOLAM HCL 2 MG/2ML IJ SOLN
INTRAMUSCULAR | Status: DC | PRN
  Administered 2016-07-08: 1 mg via INTRAVENOUS

## 2016-07-08 MED ORDER — SODIUM CHLORIDE 0.9 % IV SOLN
INTRAVENOUS | Status: DC
  Administered 2016-07-08: 07:00:00 via INTRAVENOUS

## 2016-07-08 MED ORDER — HEPARIN (PORCINE) IN NACL 2-0.9 UNIT/ML-% IJ SOLN
INTRAMUSCULAR | Status: DC | PRN
  Administered 2016-07-08: 1000 mL via INTRA_ARTERIAL

## 2016-07-08 MED ORDER — MIDAZOLAM HCL 2 MG/2ML IJ SOLN
INTRAMUSCULAR | Status: AC
  Filled 2016-07-08: qty 2

## 2016-07-08 MED ORDER — IOPAMIDOL (ISOVUE-370) INJECTION 76%
INTRAVENOUS | Status: AC
  Filled 2016-07-08: qty 100

## 2016-07-08 MED ORDER — FUROSEMIDE 40 MG PO TABS: 40.0000 mg | ORAL_TABLET | Freq: Two times a day (BID) | ORAL | 11 refills | Status: DC

## 2016-07-08 SURGICAL SUPPLY — 15 items
CATH BALLN WEDGE 5F 110CM (CATHETERS) ×3 IMPLANT
CATH INFINITI 5FR MULTPACK ANG (CATHETERS) ×2 IMPLANT
COVER PRB 48X5XTLSCP FOLD TPE (BAG) IMPLANT
COVER PROBE 5X48 (BAG) ×3
GLIDESHEATH SLEND SS 6F .021 (SHEATH) ×3 IMPLANT
GUIDEWIRE .025 260CM (WIRE) ×3 IMPLANT
GUIDEWIRE INQWIRE 1.5J.035X260 (WIRE) IMPLANT
INQWIRE 1.5J .035X260CM (WIRE) ×3
KIT HEART LEFT (KITS) ×3 IMPLANT
KIT HEART RIGHT NAMIC (KITS) ×3 IMPLANT
PACK CARDIAC CATHETERIZATION (CUSTOM PROCEDURE TRAY) ×3 IMPLANT
SHEATH FAST CATH BRACH 5F 5CM (SHEATH) ×2 IMPLANT
SHEATH PINNACLE 5F 10CM (SHEATH) ×2 IMPLANT
TRANSDUCER W/STOPCOCK (MISCELLANEOUS) ×3 IMPLANT
TUBING CIL FLEX 10 FLL-RA (TUBING) ×3 IMPLANT

## 2016-07-08 NOTE — Interval H&P Note (Signed)
History and Physical Interval Note:  07/08/2016 10:31 AM  [name]  has presented today for surgery, with the diagnosis of angina  & worsening cardiomyopathy (valvular) The various methods of treatment have been discussed with the patient and family. After consideration of risks, benefits and other options for treatment, the patient has consented to  Procedure(s): Right/Left Heart Cath and Coronary Angiography (N/A) with possible PCI as a surgical intervention .  The patient's history has been reviewed, patient examined, no change in status, stable for surgery.  I have reviewed the patient's chart and labs.  Questions were answered to the patient's satisfaction.     Cath Lab Visit (complete for each Cath Lab visit)  Clinical Evaluation Leading to the Procedure:   ACS: No.  Non-ACS:    Anginal Classification: CCS II  Anti-ischemic medical therapy: Minimal Therapy (1 class of medications)  Non-Invasive Test Results: Equivocal test results - significantly worsening EF on Echo along with worsening valve disease.  Prior CABG: No previous CABG; previous Valve surgery   Glenetta Hew

## 2016-07-08 NOTE — Progress Notes (Signed)
A 75fr sheath was pulled from the right brachial vein at approx 1220. Manual pressure was held for 6 mins. Site dressed and wrapped with coban. No issues noted, site level 0 pre/post sheath pull. A 22fr sheath was also pulled from the right femoral artery at approx 1230. Manual pressure was held for 40mins. Site was a level 0 pre/post sheath pull. No complications noted. Pt remains pain free. Right DP pulse palpated pre/post sheath pull. Vitals remained WNL throughout sheath pull. No complications noted. Post instructions given. Pt will be transferred to [facility] momentarily.

## 2016-07-08 NOTE — Progress Notes (Signed)
Pt discharged waiting for ride

## 2016-07-08 NOTE — Discharge Instructions (Signed)
Angiogram, Care After °These instructions give you information about caring for yourself after your procedure. Your doctor may also give you more specific instructions. Call your doctor if you have any problems or questions after your procedure. °Follow these instructions at home: °· Take medicines only as told by your doctor. °· Follow your doctor's instructions about: °¨ Care of the area where the tube was inserted. °¨ Bandage (dressing) changes and removal. °· You may shower 24-48 hours after the procedure or as told by your doctor. °· Do not take baths, swim, or use a hot tub until your doctor approves. °· Every day, check the area where the tube was inserted. Watch for: °¨ Redness, swelling, or pain. °¨ Fluid, blood, or pus. °· Do not apply powder or lotion to the site. °· Do not lift anything that is heavier than 10 lb (4.5 kg) for 5 days or as told by your doctor. °· Ask your doctor when you can: °¨ Return to work or school. °¨ Do physical activities or play sports. °¨ Have sex. °· Do not drive or operate heavy machinery for 24 hours or as told by your doctor. °· Have someone with you for the first 24 hours after the procedure. °· Keep all follow-up visits as told by your doctor. This is important. °Contact a health care provider if: °· You have a fever. °· You have chills. °· You have more bleeding from the area where the tube was inserted. Hold pressure on the area. °· You have redness, swelling, or pain in the area where the tube was inserted. °· You have fluid or pus coming from the area. °Get help right away if: °· You have a lot of pain in the area where the tube was inserted. °· The area where the tube was inserted is bleeding, and the bleeding does not stop after 30 minutes of holding steady pressure on the area. °· The area near or just beyond the insertion site becomes pale, cool, tingly, or numb. °This information is not intended to replace advice given to you by your health care provider. Make  sure you discuss any questions you have with your health care provider. °Document Released: 08/05/2008 Document Revised: 10/15/2015 Document Reviewed: 10/10/2012 °Elsevier Interactive Patient Education © 2017 [employer] Inc. ° °

## 2016-07-08 NOTE — H&P (View-Only) (Signed)
PCP: Aura Dials, PA-C  Clinic Note: Chief Complaint  Patient presents with  . Cardiac Valve Problem    follow up echo  . Cardiomyopathy    HPI: [name] is a 56 y.o. 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 At that time she just had a follow-up echo showing reduced EF. No other significant symptoms.  Recent Hospitalizations: none  Studies Reviewed:   2-D echo 06/16/2016: EF 25-30%. Moderate aortic stenosis with moderate regurgitation.  Interval History: She presents today earlier than originally scheduled because of the notably reduced EF on her echocardiogram that was done for follow-up. She also has progression of her valvular disease as well. She says that she has noticed that her dyspnea has gotten a lot worse. She simply gets short of breath walking on the corner. She knows that her heart rate goes up and this is what really makes her dyspneic. Even if she gets anxious and her heart rate goes up, she gets short of breath. She has not noted any chest tightness or pressure however. She has actually continued to take twice a day 40 mg Lasix, and states that she's not really have not much the way of orthopnea and PND as long as she has been doing that. Edema is well-controlled.   Besides the fact her heart rate gets up very quickly with minimal exertion, she denies any significant palpitations. When she is short of breath she does note some intermittent lightheadedness, dizziness and weakness, but no notable syncope/near syncope. No TIA/amaurosis fugax symptoms.  No claudication - she really doesn't walk around enough to get claudication  ROS: A comprehensive was performed. Review of Systems  Constitutional: Negative for malaise/fatigue.  HENT: Negative for congestion, nosebleeds and sinus pain.   Respiratory: Positive  for cough (dry cough ), shortness of breath and wheezing (More so than she had before).   Gastrointestinal: Negative for abdominal pain, blood in stool, heartburn and melena.  Musculoskeletal: Positive for joint pain.       Sensitive sternal keloid scar  Skin: Negative.   Neurological: Positive for dizziness (When dyspneic).  Psychiatric/Behavioral: Negative for depression and memory loss. The patient is not nervous/anxious and does not have insomnia.   All other systems reviewed and are negative.   Past Medical History:  Diagnosis Date  . Chronic combined systolic and diastolic heart failure, NYHA class 2 ([place])   . Dementia    Early onset  . Dysthymia   . Essential hypertension 12/16/2013  . H/O Takotsubo cardiomyopathy    with long-standing combinted chronic systolic & diastolic HF.  [name] History of uterine fibroid   . Hx of thyroid cancer 12/16/2013   Status post thyroidectomy  . Hyperlipidemia with target LDL less than 100 12/16/2013  . Hypothyroidism 12/16/2013  . 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 RAD (reactive airway disease)   . S/P MVR (mitral valve repair) 12/16/2013   26 mm Sorin MEMO 3D Ring Annuloplasty  . 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 Roxy Manns  Echo 12/2010: EF 40-45%, inferior hypokinesis. Slightly increased transaortic velocity = mild AS with mild to moderate AI normal MV gradients with no MR. Significantly improved PA pressures. Paradoxical septal motion.  Past Surgical History:  Procedure Laterality Date  . AORTIC VALVE REPAIR  August [id number]   Suture plication of all 3 commissures  . CARDIAC CATHETERIZATION  August 2010   Preop: nonobstructive coronary disease  . CARDIAC SURGERY  12/2008   Aortic & Mitral Valve Repair (Dr. Roxy Manns)  . CESAREAN SECTION    . DOPPLER ECHOCARDIOGRAPHY  May 2012    EF 40-45%, inferior hypokinesis.  Slightly increased transaortic velocity = mild AS with mild to moderate AI normal MV gradients with no MR. Significantly improved PA pressures. Paradoxical septal motion.  [name] MITRAL VALVE REPAIR  August 2010    26 mm Sorin [facility] 3D Ring Annuloplasty  . THYROID SURGERY    . TRANSTHORACIC ECHOCARDIOGRAM  05/2016   EF 25-30%. Moderate aortic stenosis with moderate regurgitation.    Current Meds  Medication Sig  . albuterol (PROVENTIL HFA;VENTOLIN HFA) 108 (90 BASE) MCG/ACT inhaler Inhale 2 puffs into the lungs 2 (two) times daily. Takes for shortness of breath  . aspirin 81 MG chewable tablet Chew 1 tablet by mouth daily.  [name] atorvastatin (LIPITOR) 10 MG tablet Take 1 tablet by mouth daily.  . beclomethasone (QVAR) 40 MCG/ACT inhaler Inhale 2 puffs into the lungs daily.   . carvedilol (COREG) 12.5 MG tablet Take 12.5 mg by mouth 2 (two) times daily with a meal.  . citalopram (CELEXA) 20 MG tablet Take 20 mg by mouth daily.  . cyclobenzaprine (FLEXERIL) 5 MG tablet Take 1 tablet (5 mg total) by mouth 3 (three) times daily as needed for muscle spasms (take for neck pain as needed).  . furosemide (LASIX) 40 MG tablet Take 0.5 tablets (20 mg total) by mouth 2 (two) times daily.  . hydrocortisone (PROCTOZONE-HC) 2.5 % rectal cream Place 1 application rectally 2 (two) times daily.  . hydrocortisone 2.5 % cream Apply 1 application topically daily as needed.  [name] levothyroxine (SYNTHROID, LEVOTHROID) 112 MCG tablet Take 112 mcg by mouth daily.  . metolazone (ZAROXOLYN) 2.5 MG tablet Take 1 tablet (2.5 mg total) by mouth daily as needed.  . traMADol (ULTRAM) 50 MG tablet Take 50 mg by mouth every 6 (six) hours as needed for moderate pain.  [name] trimethoprim-polymyxin b (POLYTRIM) ophthalmic solution Place 2 drops into both eyes every 6 (six) hours.  . [DISCONTINUED] lisinopril (PRINIVIL,ZESTRIL) 10 MG tablet Take 10 mg by mouth daily.    No Known Allergies  Social History   Social History  . Marital  status: Single    Spouse name: N/A  . Number of children: N/A  . Years of education: N/A   Social History Main Topics  . Smoking status: Former Research scientist (life sciences)  . Smokeless tobacco: Never Used     Comment: quit 2011  . Alcohol use No  . Drug use: No  . Sexual activity: Not Currently   Other Topics Concern  . None   Social History Narrative   Unemployed, on disability.   Now is finally reestablished with a PCP.   She is a former smoker, quit in 2011. Denies alcohol consumption   Tries to walk routinely.    family history includes CVA in her mother; Heart disease in her mother.  Wt Readings from Last 3 Encounters:  07/04/16 83.9 kg (185 lb)  05/11/16 82.9 kg (182 lb 12.8 oz)  04/13/15 83.7 kg (184 lb 9.6 oz)    PHYSICAL EXAM BP 120/70   Pulse (!) 53   Ht 4\' 9"  (1.448 m)   Wt 83.9 kg (185 lb)  LMP 03/06/2012   BMI 40.03 kg/m  General appearance: alert, cooperative, appears stated age, no distress and moderately obese Neck: no adenopathy, no carotid bruit and no JVD Lungs: clear to auscultation bilaterally, normal percussion bilaterally and non-labored Heart: RRR with ectopy, S1 &S2 normal, no click, rub or gallop; mildly displaced PMI.  She has both a 3/6 SEM at RUSB radiating to carotids as well as a 1-2/6 DM at LUSB Abdomen: soft, non-tender; bowel sounds normal; no masses, no organomegaly; no longer noticing increased abdominal girth. Extremities: extremities normal, atraumatic, no cyanosis, and edema - trace Pulses: 2+ and symmetric;  Skin: mobility and turgor normal - keloid scar at the sternotomy site. There is no tenderness or pain along the sternal wire locations, but simply running my fingers softly across the skin lesions to extreme sensitivity. 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  Regimen discussed in heart failure section      Relevant Medications   rosuvastatin (CRESTOR) 20 MG tablet   losartan (COZAAR) 50 MG tablet   Other Relevant Orders   TRANSESOPHAGEAL ECHOCARDIOGRAM W/O CARDIOVERSION   LEFT AND RIGHT HEART CATHETERIZATION WITH CORONARY ANGIOGRAM   Chronic combined systolic and diastolic heart failure, NYHA class 2 (HCC) (Chronic)    Still showing signs of volume overload, but much improved with increased dose of Lasix. With worsening EF and valvular regurgitation/stenosis, I think we need to evaluate for ischemia as well as worsening valve disease.  Plan: TEE as well as Right & Left Heart Catheterization. She is on a good dose of carvedilol and standing dose of Lasix 40 mg twice a day. I will switch her from lisinopril to losartan given her cough. Still using when necessary Zaroxolyn maybe 3 times a week.      Relevant Medications   rosuvastatin (CRESTOR) 20 MG tablet   losartan (COZAAR) 50 MG tablet   S/P aortic valve repair (Chronic)    Moderate stenosis and regurgitation noted on recent echo. Plan TEE and R&LHC      Essential hypertension (Chronic)    Relatively well-controlled. I'm switching her to ARB now from lisinopril because of her cough. This will then allow [place] to potentially convert to [facility] depending on what the cardiac catheterization and TEE show.  On stable dose of carvedilol.      Relevant Medications   rosuvastatin (CRESTOR) 20 MG tablet   losartan (COZAAR) 50 MG tablet   Hyperlipidemia with target LDL less than 100 (Chronic)    On standing dose of statin. Followed by PCP.      Relevant Medications   rosuvastatin (CRESTOR) 20 MG tablet    losartan (COZAAR) 50 MG tablet   Angina, class II (HCC)    I'm concerned that her significant dyspnea and occasional tightness in her chest associated with it is potentially anginal equivalent. This is either related to angina from coronary disease or from elevated LVEDP.  Right and left heart catheterization with possible PCI.  Procedure:  Right and Left Heart Catheterization with possible Percutaneous Coronary Intervention  The procedure with  Risks/Benefits/Alternatives and Indications was reviewed with the patient and her daughter.  All questions were answered.    Risks / Complications include, but not limited to: Death, MI, CVA/TIA, VF/VT (with defibrillation), Bradycardia (need for temporary pacer placement), contrast induced nephropathy, bleeding / bruising / hematoma / pseudoaneurysm, vascular or coronary injury (with possible emergent CT or Vascular Surgery), adverse medication reactions, infection.  Additional risks involving the use of radiation with the possibility of radiation burns and cancer were explained in detail.  The patient (and family) voice understanding and agree to proceed.         Relevant Medications   rosuvastatin (CRESTOR) 20 MG tablet   losartan (COZAAR) 50 MG tablet   Other Relevant Orders   LEFT AND RIGHT HEART CATHETERIZATION WITH CORONARY ANGIOGRAM    Other Visit Diagnoses    Exertional dyspnea       Relevant Orders   TRANSESOPHAGEAL ECHOCARDIOGRAM W/O CARDIOVERSION   LEFT AND RIGHT HEART CATHETERIZATION WITH CORONARY ANGIOGRAM   Pre-procedural examination       Relevant Orders   Basic metabolic panel (Completed)   CBC (Completed)   DG Chest 2 View (Completed)      Current medicines are reviewed at length with the patient today. (+/- concerns) - taking 40 mg Lasix BID The following changes have been made: see below  Patient Instructions  Your physician has recommended you make the following change in your medication:  -- STOP lisinopril --  START losartan 50mg  once daily   Your physician has requested that you have a right & left cardiac catheterization @ [facility] w/Dr. Ellyn Hack. Cardiac catheterization is used to diagnose and/or treat various heart conditions. Doctors may recommend this procedure for a number of different reasons. The most common reason is to evaluate chest pain. Chest pain can be a symptom of coronary artery disease (CAD), and cardiac catheterization can show whether plaque is narrowing or blocking your heart's arteries. This procedure is also used to evaluate the valves, as well as measure the blood flow and oxygen levels in different parts of your heart. For further information please visit [email or web address]. Please follow instruction sheet, as given.  Following your catheterization, you will not be allowed to drive for 3 days.  No lifting, pushing, or pulling greater that 10 pounds is allowed for 1 week.  Your physician has requested that you have a TEE with Dr. Debara Pickett or Dr. Sallyanne Kuster (please schedule same day as cath if possible). During a TEE, sound waves are used to create images of your heart. It provides your doctor with information about the size and shape of your heart and how well your heart's chambers and valves are working. In this test, a transducer is attached to the end of a flexible tube that's guided down your throat and into your esophagus (the tube leading from you mouth to your stomach) to get a more detailed image of your heart. You are not awake for the procedure. Please see the instruction sheet given to you today. For further information please visit [email or web address].  You will be required to have the following tests prior to the procedure:  1. Blood work - the blood work can be done no more than 14 days prior to the procedure.  It can be done at any [facility] lab. There is a lab downstairs on the first floor of this building in [address] and one at [address] [place] [address].  2.  Chest X-ray - this can be done at  [facility] in the [employer] @ [address]   Your physician recommends that you schedule a follow-up appointment with Dr. Ellyn Hack after your procedures.   Studies Ordered:   Orders Placed This Encounter  Procedures  . DG Chest 2 View  . Basic metabolic panel  . CBC  . TRANSESOPHAGEAL ECHOCARDIOGRAM W/O CARDIOVERSION  . LEFT AND RIGHT HEART CATHETERIZATION WITH CORONARY Illene Silver, M.D., M.S. Interventional Cardiologist   Pager # [phone] Phone # [phone] [address] [place], [place] [address]

## 2016-07-11 ENCOUNTER — Encounter (HOSPITAL_COMMUNITY): Payer: Self-pay | Admitting: Cardiology

## 2016-07-11 MED FILL — Verapamil HCl IV Soln 2.5 MG/ML: INTRAVENOUS | Qty: 2 | Status: AC

## 2016-07-13 ENCOUNTER — Ambulatory Visit (HOSPITAL_BASED_OUTPATIENT_CLINIC_OR_DEPARTMENT_OTHER)
Admission: RE | Admit: 2016-07-13 | Discharge: 2016-07-13 | Disposition: A | Discharge status: Home / Self Care | Payer: Medicaid Other | Source: Ambulatory Visit | Attending: Internal Medicine | Admitting: Internal Medicine

## 2016-07-13 ENCOUNTER — Ambulatory Visit (HOSPITAL_COMMUNITY)
Admission: RE | Admit: 2016-07-13 | Discharge: 2016-07-13 | Disposition: A | Discharge status: Home / Self Care | Payer: Medicaid Other | Source: Ambulatory Visit | Attending: Internal Medicine | Admitting: Internal Medicine

## 2016-07-13 ENCOUNTER — Encounter (HOSPITAL_COMMUNITY): Payer: Self-pay | Admitting: *Deleted

## 2016-07-13 ENCOUNTER — Encounter (HOSPITAL_COMMUNITY)
Admission: RE | Disposition: A | Discharge status: Home / Self Care | Payer: Self-pay | Source: Ambulatory Visit | Attending: Internal Medicine

## 2016-07-13 DIAGNOSIS — J45909 Unspecified asthma, uncomplicated: Secondary | ICD-10-CM | POA: Insufficient documentation

## 2016-07-13 DIAGNOSIS — Z87891 Personal history of nicotine dependence: Secondary | ICD-10-CM | POA: Diagnosis not present

## 2016-07-13 DIAGNOSIS — I352 Nonrheumatic aortic (valve) stenosis with insufficiency: Secondary | ICD-10-CM | POA: Diagnosis present

## 2016-07-13 DIAGNOSIS — Z85850 Personal history of malignant neoplasm of thyroid: Secondary | ICD-10-CM | POA: Diagnosis not present

## 2016-07-13 DIAGNOSIS — E039 Hypothyroidism, unspecified: Secondary | ICD-10-CM | POA: Insufficient documentation

## 2016-07-13 DIAGNOSIS — Z9889 Other specified postprocedural states: Secondary | ICD-10-CM

## 2016-07-13 DIAGNOSIS — Z79899 Other long term (current) drug therapy: Secondary | ICD-10-CM | POA: Insufficient documentation

## 2016-07-13 DIAGNOSIS — E785 Hyperlipidemia, unspecified: Secondary | ICD-10-CM | POA: Insufficient documentation

## 2016-07-13 DIAGNOSIS — F0390 Unspecified dementia without behavioral disturbance: Secondary | ICD-10-CM | POA: Insufficient documentation

## 2016-07-13 DIAGNOSIS — Z7951 Long term (current) use of inhaled steroids: Secondary | ICD-10-CM | POA: Diagnosis not present

## 2016-07-13 DIAGNOSIS — I110 Hypertensive heart disease with heart failure: Secondary | ICD-10-CM | POA: Diagnosis not present

## 2016-07-13 DIAGNOSIS — I5042 Chronic combined systolic (congestive) and diastolic (congestive) heart failure: Secondary | ICD-10-CM | POA: Insufficient documentation

## 2016-07-13 DIAGNOSIS — I428 Other cardiomyopathies: Secondary | ICD-10-CM

## 2016-07-13 DIAGNOSIS — Z7982 Long term (current) use of aspirin: Secondary | ICD-10-CM | POA: Insufficient documentation

## 2016-07-13 DIAGNOSIS — I351 Nonrheumatic aortic (valve) insufficiency: Secondary | ICD-10-CM

## 2016-07-13 DIAGNOSIS — I083 Combined rheumatic disorders of mitral, aortic and tricuspid valves: Secondary | ICD-10-CM | POA: Insufficient documentation

## 2016-07-13 HISTORY — PX: TEE WITHOUT CARDIOVERSION: SHX5443

## 2016-07-13 SURGERY — ECHOCARDIOGRAM, TRANSESOPHAGEAL
Anesthesia: Moderate Sedation

## 2016-07-13 MED ORDER — SODIUM CHLORIDE 0.9 % IV SOLN
INTRAVENOUS | Status: DC
  Administered 2016-07-13: 10:00:00 via INTRAVENOUS

## 2016-07-13 MED ORDER — MIDAZOLAM HCL 5 MG/ML IJ SOLN
INTRAMUSCULAR | Status: AC
  Filled 2016-07-13: qty 2

## 2016-07-13 MED ORDER — FENTANYL CITRATE (PF) 100 MCG/2ML IJ SOLN
INTRAMUSCULAR | Status: DC | PRN
  Administered 2016-07-13 (×2): 25 ug via INTRAVENOUS

## 2016-07-13 MED ORDER — LIDOCAINE VISCOUS 2 % MT SOLN
OROMUCOSAL | Status: DC | PRN
  Administered 2016-07-13: 1 via OROMUCOSAL

## 2016-07-13 MED ORDER — LIDOCAINE VISCOUS 2 % MT SOLN
OROMUCOSAL | Status: AC
  Filled 2016-07-13: qty 15

## 2016-07-13 MED ORDER — MIDAZOLAM HCL 10 MG/2ML IJ SOLN
INTRAMUSCULAR | Status: DC | PRN
  Administered 2016-07-13 (×2): 2 mg via INTRAVENOUS

## 2016-07-13 MED ORDER — FENTANYL CITRATE (PF) 100 MCG/2ML IJ SOLN
INTRAMUSCULAR | Status: AC
  Filled 2016-07-13: qty 2

## 2016-07-13 MED ORDER — BUTAMBEN-TETRACAINE-BENZOCAINE 2-2-14 % EX AERO
INHALATION_SPRAY | CUTANEOUS | Status: DC | PRN
  Administered 2016-07-13: 2 via TOPICAL

## 2016-07-13 NOTE — CV Procedure (Signed)
TRANSESOPHAGEAL ECHOCARDIOGRAM (TEE) NOTE  INDICATIONS: prior mitral annuloplasty/repair and aortic valve repair  PROCEDURE:   Informed consent was obtained prior to the procedure. The risks, benefits and alternatives for the procedure were discussed and the patient comprehended these risks.  Risks include, but are not limited to, cough, sore throat, vomiting, nausea, somnolence, esophageal and stomach trauma or perforation, bleeding, low blood pressure, aspiration, pneumonia, infection, trauma to the teeth and death.    After a procedural time-out, the patient was given 4 mg versed and 50 mcg fentanyl for moderate sedation.  The patient's heart rate, blood pressure, and oxygen saturation are monitored continuously during the procedure.The oropharynx was anesthetized 2 cc of topical 1% viscous lidocaine and 2 cetacaine sprays.  The transesophageal probe was inserted in the esophagus and stomach without difficulty and multiple views were obtained.  The patient was kept under observation until the patient left the procedure room.  The period of conscious sedation is 30 minutes, of which I was present face-to-face 100% of this time. The patient left the procedure room in stable condition.   Agitated microbubble saline contrast was not administered.  COMPLICATIONS:    There were no immediate complications.  Findings:  1. LEFT VENTRICLE: The left ventricular wall thickness is normal.  The left ventricular cavity is dilated in size. Wall motion is severely globally hypokinetic.  LVEF is 25-30%.  2. RIGHT VENTRICLE:  The right ventricle is normal in structure and function without any thrombus or masses.    3. LEFT ATRIUM:  The left atrium is moderately dilated in size without any thrombus or masses.  There is spontaneous echo contrast ("smoke") in the left atrium consistent with a low flow state.  4. LEFT ATRIAL APPENDAGE:  The left atrial appendage is free of any thrombus or masses. The  appendage has single lobes. Pulse doppler indicates moderate flow in the appendage.  5. ATRIAL SEPTUM:  The atrial septum appears intact and is free of thrombus and/or masses.  There is no evidence for interatrial shunting by color doppler.  6. RIGHT ATRIUM:  The right atrium is dilated without any thrombus or masses.  7. MITRAL VALVE:  The mitral valve is s/p annuloplasty repair. There is mild stenosis with severe eccentric regurgitation regurgitation.   8. AORTIC VALVE:  The aortic valve is trileaflet and has been previously repaired - it is noted to be thickened and rheumatic appearing. There is moderate stenosis - AVA by planimetry is 1.1 cm2. There is  Moderate central regurgitation.  9. TRICUSPID VALVE:  The tricuspid valve is normal in structure and function with Mild regurgitation.  There were no vegetations or stenosis  10.  PULMONIC VALVE:  The pulmonic valve is normal in structure and function with trivial regurgitation.  There were no vegetations or stenosis.   11. AORTIC ARCH, ASCENDING AND DESCENDING AORTA:  There was no Ron Parker et. Al, 1992) atherosclerosis of the ascending aorta, aortic arch, or proximal descending aorta.  12. PULMONARY VEINS: Anomalous pulmonary venous return was not noted.  13. PERICARDIUM: The pericardium appeared normal and non-thickened.  There is no pericardial effusion.  IMPRESSION:   1. 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 2. Severe, eccentric MR with intact annuloplasty ring, thickened leaflets and mild mitral stenosis. 3. No LAA thrombus, however, moderately dilated LA with smoke 4. Negative for PFO by color doppler 5. Dilated LV with severe global hypokinesis, LVEF 25-30%  RECOMMENDATIONS:  1.  D/w Dr. Ellyn Hack - she had LHC last week with markedly reduced CO/CI, but relatively preserved MVO2 of 58%, moderate pulmonary hypertension and increased TPG. Lasix  has been ineffective - class IV HF symptoms. D/w Dr. Aundra Dubin - he will see in CHF clinic later this week.  Time Spent Directly with the Patient:  60 minutes   [name], MD, [facility] Attending Cardiologist [facility]  07/13/2016, 11:55 AM

## 2016-07-13 NOTE — H&P (Signed)
    INTERVAL PROCEDURE H&P  History and Physical Interval Note:  07/13/2016 10:37 AM  [name] has presented today for their planned procedure. The various methods of treatment have been discussed with the patient and family. After consideration of risks, benefits and other options for treatment, the patient has consented to the procedure.  The patients' outpatient history has been reviewed, patient examined, and no change in status from most recent office note within the past 30 days. I have reviewed the patients' chart and labs and will proceed as planned. Questions were answered to the patient's satisfaction.   [name], MD, [facility] Attending Cardiologist [place] 07/13/2016, 10:37 AM

## 2016-07-13 NOTE — Discharge Instructions (Signed)

## 2016-07-14 ENCOUNTER — Encounter (HOSPITAL_COMMUNITY): Payer: Self-pay | Admitting: Internal Medicine

## 2016-07-15 ENCOUNTER — Ambulatory Visit (HOSPITAL_COMMUNITY)
Admit: 2016-07-15 | Discharge: 2016-07-15 | Disposition: A | Discharge status: Home / Self Care | Payer: Medicaid Other | Source: Ambulatory Visit | Attending: Cardiology | Admitting: Cardiology

## 2016-07-15 ENCOUNTER — Encounter (HOSPITAL_COMMUNITY): Payer: Self-pay

## 2016-07-15 VITALS — BP 162/88 | HR 76 | Ht <= 58 in | Wt 189.8 lb

## 2016-07-15 DIAGNOSIS — I5042 Chronic combined systolic (congestive) and diastolic (congestive) heart failure: Secondary | ICD-10-CM | POA: Diagnosis not present

## 2016-07-15 DIAGNOSIS — Z7951 Long term (current) use of inhaled steroids: Secondary | ICD-10-CM | POA: Insufficient documentation

## 2016-07-15 DIAGNOSIS — I110 Hypertensive heart disease with heart failure: Secondary | ICD-10-CM | POA: Diagnosis not present

## 2016-07-15 DIAGNOSIS — Z823 Family history of stroke: Secondary | ICD-10-CM | POA: Insufficient documentation

## 2016-07-15 DIAGNOSIS — Z7982 Long term (current) use of aspirin: Secondary | ICD-10-CM | POA: Diagnosis not present

## 2016-07-15 DIAGNOSIS — Z79899 Other long term (current) drug therapy: Secondary | ICD-10-CM | POA: Diagnosis not present

## 2016-07-15 DIAGNOSIS — Z87891 Personal history of nicotine dependence: Secondary | ICD-10-CM | POA: Diagnosis not present

## 2016-07-15 DIAGNOSIS — E785 Hyperlipidemia, unspecified: Secondary | ICD-10-CM | POA: Insufficient documentation

## 2016-07-15 DIAGNOSIS — I428 Other cardiomyopathies: Secondary | ICD-10-CM

## 2016-07-15 DIAGNOSIS — E039 Hypothyroidism, unspecified: Secondary | ICD-10-CM | POA: Insufficient documentation

## 2016-07-15 DIAGNOSIS — I5022 Chronic systolic (congestive) heart failure: Secondary | ICD-10-CM | POA: Insufficient documentation

## 2016-07-15 DIAGNOSIS — I080 Rheumatic disorders of both mitral and aortic valves: Secondary | ICD-10-CM | POA: Diagnosis not present

## 2016-07-15 DIAGNOSIS — Z8249 Family history of ischemic heart disease and other diseases of the circulatory system: Secondary | ICD-10-CM | POA: Insufficient documentation

## 2016-07-15 LAB — BASIC METABOLIC PANEL
ANION GAP: 8 (ref 5–15)
BUN: 18 mg/dL (ref 6–20)
CO2: 24 mmol/L (ref 22–32)
Calcium: 9 mg/dL (ref 8.9–10.3)
Chloride: 109 mmol/L (ref 101–111)
Creatinine, Ser: 1.07 mg/dL — ABNORMAL HIGH (ref 0.44–1.00)
GFR calc Af Amer: >60 mL/min (ref >60)
GFR, EST NON AFRICAN AMERICAN: 57 mL/min — AB (ref >60)
GLUCOSE: 160 mg/dL — AB (ref 65–99)
POTASSIUM: 3.4 mmol/L — AB (ref 3.5–5.1)
Sodium: 141 mmol/L (ref 135–145)

## 2016-07-15 LAB — BRAIN NATRIURETIC PEPTIDE: B Natriuretic Peptide: 1010.4 pg/mL — ABNORMAL HIGH (ref 0.0–100.0)

## 2016-07-15 MED ORDER — FUROSEMIDE 40 MG PO TABS: ORAL_TABLET | ORAL | 3 refills | Status: DC

## 2016-07-15 MED ORDER — SACUBITRIL-VALSARTAN 24-26 MG PO TABS: 1.0000 | ORAL_TABLET | Freq: Two times a day (BID) | ORAL | 3 refills | Status: DC

## 2016-07-15 MED ORDER — SPIRONOLACTONE 25 MG PO TABS: 12.5000 mg | ORAL_TABLET | Freq: Every day | ORAL | 3 refills | Status: DC

## 2016-07-15 MED ORDER — POTASSIUM CHLORIDE CRYS ER 20 MEQ PO TBCR: 20.0000 meq | EXTENDED_RELEASE_TABLET | Freq: Every day | ORAL | 3 refills | Status: DC

## 2016-07-15 NOTE — Patient Instructions (Signed)
Increase Furosemide to 80 mg (2 tabs) Twice daily FOR 5 DAYS ONLY, Then take 80 mg (2 tab) in AM and 40 mg (1 tab) in PM  Stop Losartan  Start Entresto 24/26 mg Twice daily   Start Spironolactone 12.5 mg (1/2 tab) daily  Start K-dur (potassium) 20 meq daily  Labs today  You have been referred to Dr Roxy Manns at [facility], they will call you to schedule an appointment  Your physician recommends that you schedule a follow-up appointment in: 1 week

## 2016-07-18 ENCOUNTER — Telehealth (HOSPITAL_COMMUNITY): Payer: Self-pay | Admitting: Pharmacist

## 2016-07-18 NOTE — Progress Notes (Signed)
PCP: Roe Coombs Cardiology: Dr. Ellyn Hack HF Cardiology: Dr. Aundra Dubin  56 yo with history of rheumatic heart disease s/p aortic and mitral valve repairs in 2010 has developed CHF and presents for CHF clinic evaluation.  She had aortic and mitral repairs in 2010.  Afterwards, EF was mildly low in the 40-45% range.  However, in the last year, EF has dropped.  EF 25-30% on 1/18 echo.  TEE showed severe MR, moderate AI, moderate aortic stenosis.  RHC/LHC in 2/18 showed on significantly elevated LV and RV filling pressures.    She has been more short of breath x 2 months.  She is dyspneic after walking 100 feet.  Sometimes short of breath just walking in the house. Chest feels heavy when she gets short of breath. No lightheadedness/syncope.  2 pillow orthopnea and occasional PND episodes described.   Labs (2/18): K 3.8, creatinine 1.09, hgb 12.7  ECG: NSR, 1st degree AVB, IVCD 134 msec (personally reviewed).   PMH: 1. HTN 2. Hypothyroidism 3. Hyperlipidemia 4. Rheumatic heart disease: s/p MV repair and aortic valve repair in 2010, Dr. Roxy Manns. - TEE (2/18): EF 35-30%, dilated LV, rheumatic-appearing aortic valve s/p repair with moderate AS (AVA 1.1 cm^2), moderate AI.  MV s/p repeat with mild mitral stenosis and eccentric severe MR.   5. Chronic systolic CHF: Nonischemic cardiomyopathy, probably related to valvular disease.   - Echo (11/15): EF 40-45%, - Echo (12/16): EF 35-40%. - Echo (1/18): EF 25-30%.  - LHC/RHC (2/18): Normal coronaries, mean RA 18, mean PA 74/39 mean 52, mean PCWP 31, CI 1.98 Fick.  Social History   Social History  . Marital status: Single    Spouse name: N/A  . Number of children: N/A  . Years of education: N/A   Occupational History  . Not on file.   Social History Main Topics  . Smoking status: Former Research scientist (life sciences)  . Smokeless tobacco: Never Used     Comment: quit 2011  . Alcohol use No  . Drug use: No  . Sexual activity: Not Currently   Other Topics Concern  .  Not on file   Social History Narrative   Unemployed, on disability.   Now is finally reestablished with a PCP.   She is a former smoker, quit in 2011. Denies alcohol consumption   Tries to walk routinely.   Family History  Problem Relation Age of Onset  . Heart disease Mother   . CVA Mother    ROS: All systems reviewed and negative except as per HPI.   Current Outpatient Prescriptions  Medication Sig Dispense Refill  . albuterol (PROVENTIL HFA;VENTOLIN HFA) 108 (90 BASE) MCG/ACT inhaler Inhale 2 puffs into the lungs every 6 (six) hours as needed for shortness of breath.     [name] aspirin 81 MG chewable tablet Chew 81 mg by mouth daily.     . beclomethasone (QVAR) 40 MCG/ACT inhaler Inhale 2 puffs into the lungs daily.     . cycloSPORINE (RESTASIS) 0.05 % ophthalmic emulsion Place 1 drop into both eyes 2 (two) times daily.    . furosemide (LASIX) 40 MG tablet Take 2 tabs in AM and 1 tab in PM 90 tablet 3  . gabapentin (NEURONTIN) 100 MG capsule Take 100 mg by mouth daily.   0  . hydrocortisone 2.5 % cream Apply 1 application topically daily as needed (itching).     [name] levothyroxine (SYNTHROID, LEVOTHROID) 112 MCG tablet Take 112 mcg by mouth daily.    . rosuvastatin (CRESTOR) 20  MG tablet Take 20 mg by mouth daily.   0  . metolazone (ZAROXOLYN) 2.5 MG tablet Take 1 tablet (2.5 mg total) by mouth daily as needed. (Patient not taking: Reported on 07/15/2016) 15 tablet 11  . potassium chloride SA (K-DUR,KLOR-CON) 20 MEQ tablet Take 1 tablet (20 mEq total) by mouth daily. 30 tablet 3  . sacubitril-valsartan (ENTRESTO) 24-26 MG Take 1 tablet by mouth 2 (two) times daily. 60 tablet 3  . spironolactone (ALDACTONE) 25 MG tablet Take 0.5 tablets (12.5 mg total) by mouth daily. 15 tablet 3   No current facility-administered medications for this encounter.    BP (!) 162/88 (BP Location: Left Arm, Patient Position: Sitting, Cuff Size: Normal)   Pulse 76   Ht 4\' 9"  (1.448 m)   Wt 189 lb 12.8 oz (86.1  kg)   LMP 03/06/2012   SpO2 100%   BMI 41.07 kg/m  General: NAD Neck: JVP 14-16 cm, no thyromegaly or thyroid nodule.  Lungs: Clear to auscultation bilaterally with normal respiratory effort. CV: Nondisplaced PMI.  Heart regular S1/S2, no S3/S4, 3/6 HSM apex, 2/6 SEM RUSB.  Trace ankle edema.  No carotid bruit.  Normal pedal pulses.  Abdomen: Soft, nontender, no hepatosplenomegaly, mild distention.  Skin: Intact without lesions or rashes.  Neurologic: Alert and oriented x 3.  Psych: Normal affect. Extremities: No clubbing or cyanosis.  HEENT: Normal.   Assessment/Plan: 1. 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  She had aortic and mitral valve repairs in 2010.  Now the aortic valve is moderate stenotic and moderately regurgitant, and there is severe eccentric MR.  RHC showed elevated filling pressures.  I think that she is going to need valve surgery (MV and AoV replacement) once she is more stable clinically (filling pressures lowered).   - I will refer to Dr Roxy Manns for evaluation.  2. Chronic systolic CHF: EF [id number].  Nonischemic cardiomyopathy (no significant disease on 2/18 LHC).  It is possible that this cardiomyopathy is driven by her valve disease.  She is markedly volume overloaded on exam.  NYHA class III symptoms.   - Increase Lasix to 80 mg bid x 5 days then 80 qam/40 qpm.  BMET/BNP today, repeat BMET 1 week.  - D/c losartan, start Entresto 24/26 bid.  - Start spironolactone 12.5 mg daily. - Add KCl 20 daily.   She will need to followup in 1 week.   Loralie Champagne 07/18/2016

## 2016-07-18 NOTE — Telephone Encounter (Signed)
Entresto PA approved by [place] [employer] through 07/18/17.   Ruta Hinds. Velva Harman, PharmD, BCPS, CPP Clinical Pharmacist Pager: [phone] Phone: [phone] 07/18/2016 11:10 AM

## 2016-07-20 NOTE — Telephone Encounter (Signed)
I see that she has planned hemorrhoid surgery. Following her cardiac cath and TEE, I referred her to Dr. Aundra Dubin. She saw Dr. Aundra Dubin on the 23rd and will see him again next week.  I would like to get his input as far as his thoughts on her surgery. May be best to have her valve surgery evaluation prior to this being done.  I will forward this message to Dr. Aundra Dubin as well.  Glenetta Hew, M.D., M.S. Interventional Cardiologist   Pager # [phone] Phone # [phone] [address]. [place], [place] [address]

## 2016-07-20 NOTE — Telephone Encounter (Signed)
Left message on [name]'s voice mail ( DR REYES JULIO) Informing patient needs evaluation with TCTs for  Possible valvular surgery. Any question left phone number to call back

## 2016-07-20 NOTE — Telephone Encounter (Signed)
I think she ought to be stabilized and have her TCTS evaluation prior to hemorrhoidal surgery unless she is having a lot of bleeding that is bringing down her hemoglobin.

## 2016-07-21 ENCOUNTER — Encounter: Payer: Self-pay | Admitting: Thoracic Surgery (Cardiothoracic Vascular Surgery)

## 2016-07-21 ENCOUNTER — Institutional Professional Consult (permissible substitution) (INDEPENDENT_AMBULATORY_CARE_PROVIDER_SITE_OTHER): Payer: Medicaid Other | Admitting: Thoracic Surgery (Cardiothoracic Vascular Surgery)

## 2016-07-21 ENCOUNTER — Other Ambulatory Visit: Payer: Self-pay | Admitting: *Deleted

## 2016-07-21 VITALS — BP 112/70 | HR 69 | Resp 20 | Ht <= 58 in | Wt 180.0 lb

## 2016-07-21 DIAGNOSIS — I099 Rheumatic heart disease, unspecified: Secondary | ICD-10-CM | POA: Diagnosis not present

## 2016-07-21 DIAGNOSIS — I5042 Chronic combined systolic (congestive) and diastolic (congestive) heart failure: Secondary | ICD-10-CM | POA: Diagnosis not present

## 2016-07-21 DIAGNOSIS — I428 Other cardiomyopathies: Secondary | ICD-10-CM

## 2016-07-21 DIAGNOSIS — I7409 Other arterial embolism and thrombosis of abdominal aorta: Secondary | ICD-10-CM

## 2016-07-21 DIAGNOSIS — I351 Nonrheumatic aortic (valve) insufficiency: Secondary | ICD-10-CM

## 2016-07-21 DIAGNOSIS — Z9889 Other specified postprocedural states: Secondary | ICD-10-CM

## 2016-07-21 DIAGNOSIS — I272 Pulmonary hypertension, unspecified: Secondary | ICD-10-CM | POA: Diagnosis not present

## 2016-07-21 DIAGNOSIS — K922 Gastrointestinal hemorrhage, unspecified: Secondary | ICD-10-CM | POA: Insufficient documentation

## 2016-07-21 NOTE — Progress Notes (Signed)
[address]       [place], [address]             [place] REPORT  Referring Provider is Larey Dresser, MD  Primary Cardiologist is Leonie Man, MD PCP is Aura Dials, PA-C  Chief Complaint  Patient presents with  . 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 Patient has a long-standing history of nonischemic cardiomyopathy, hypertension, and congestive heart failure. 4 years she was followed with what was felt to be Takotsubo cardiomyopathy despite the fact that multiple echocardiograms revealed the presence of severe mitral regurgitation. In 2010 she underwent a thorough cardiac workup and was found to have moderate aortic insufficiency and severe mitral regurgitation with moderate LV systolic dysfunction. She underwent aortic valve repair (suture plication of the three commissures) and mitral ring annuloplasty.  Findings at the time of surgery were consistent with rheumatic valvular heart disease. Valve replacement was avoided because at that time the patient had been having problems with recurrent postmenopausal uterine bleeding. She did well postoperatively and has been followed regularly by Dr. Ellyn Hack. The patient states that over the past 4 months she has somewhat suddenly developed dramatic worsening of symptoms of exertional shortness of breath. Symptoms progressed to the point where she was having  intermittent resting shortness of breath, severe orthopnea, PND, persistent nonproductive cough, and lower extremity edema.  Coincident with this she developed increased tightness across her chest, particularly with exertion. She has had some mild dizziness and weakness without syncope. Follow-up transthoracic echocardiogram performed 06/15/2016 revealed a significant drop in left ventricular systolic function with ejection fraction estimated at only 25-30%. There was moderate aortic stenosis and moderate aortic insufficiency. Peak velocity across the aortic valve was measured 3.7 m/s corresponding to mean transvalvular gradient estimated 30 mmHg. There was reportedly only mild mitral regurgitation but images were limited and transesophageal echocardiogram was recommended. She was seen in follow-up by Dr. Ellyn Hack and underwent left and right heart catheterization on 09/05/2016. She was found to have normal coronary arteries with no significant coronary artery disease. There was severe pulmonary hypertension with PA pressures measured 74/39.  Both left ventricular end-diastolic pressure and mean pulmonary capillary wedge pressure were measured 31 mmHg.  Cardiac output was measured 3.4 L/m corresponding to a cardiac index of 2.0 with mean central venous pressure 18 mmHg. The patient subsequently underwent transesophageal echocardiogram by Dr. Debara Pickett on 07/13/2016.  This confirmed the presence of severe global left ventricular systolic dysfunction with ejection fraction estimated 25-30%. The aortic valve was trileaflet with thickened leaflets consistent with rheumatic disease. There was moderate aortic stenosis with moderate central aortic insufficiency. There was severe mitral regurgitation. The left atrium was dilated. There was no thrombus in the left atrial appendage. No other significant abnormalities were noted.  2 days later she was seen in the advanced heart failure clinic by Dr. Aundra Dubin. Medications were  adjusted and the patient has now been referred for surgical consultation.  The  patient is single and lives locally in [place] with her daughter. She has an adult son who lives in [place]. She has been disabled for approximately 5 years. She states that she did well initially after her heart surgery in 2010 and really didn't have much of any trouble at all until approximately 4 months ago when she began to experience worsening symptoms of shortness of breath. Over the past 2 weeks symptoms have improved with adjustments in her medical therapy. Prior to that she would get short of breath with very mild activity and occasionally at rest. She was having problems with persistent cough that has improved with diuretic therapy. She has lost approximately 9 pounds over the past 2 weeks with diuresis. She still gets short of breath with low-level activity and she cannot lay flat in bed. She has some tightness across her chest when she is short of breath.  She has to use at least 2 pillows to sleep at night. Her sleeping has improved in the last couple of weeks, prior to that she couldn't sleep at all because of dyspnea. She reports no problems with her appetite or swallowing. She has problems with chronic constipation and has been evaluated recently for possible hemorrhoidectomy. She denies any history of vaginal bleeding like she had in the distant past. She has not had any history of bleeding ulcers or gastritis in recent years. She has had occasional mild bleeding from her hemorrhoids.  Past Medical History:  Diagnosis Date  . Chronic combined systolic and diastolic CHF (congestive heart failure) ([facility])   . Dementia    Early onset  . Dysthymia   . Essential hypertension 12/16/2013  . H/O Takotsubo cardiomyopathy - likely misdiagnosed    patient ultimately found to have likely longstanding  rheumatic valvular heart disease  . History of GI bleeding   . History of uterine fibroid   . Hx of thyroid  cancer 12/16/2013   Status post thyroidectomy  . Hyperlipidemia with target LDL less than 100 12/16/2013  . Hypothyroidism 12/16/2013  . 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 Morbid obesity ([place])   . Non-ischemic cardiomyopathy ([facility])   . Post-menopausal bleeding 12/05/2011  . Pulmonary hypertension   . RAD (reactive airway disease)   . 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   August 2010:History of aortic and mitral valve repair for severe MR and moderate AI - Dr. Roxy Manns  Echo 12/2010: EF 40-45%, inferior hypokinesis. Slightly increased transaortic velocity = mild AS with mild to moderate AI normal MV gradients with no MR. Significantly improved PA pressures. Paradoxical septal motion.   . S/P aortic valve repair 31/51/7616   suture plication of 3 commissures  . S/P MVR (mitral valve repair) 12/23/2008   26 mm Sorin MEMO 3D Ring Annuloplasty    Past Surgical History:  Procedure Laterality Date  . AORTIC VALVE REPAIR  August 0737   Suture plication of all 3 commissures  . CARDIAC CATHETERIZATION  August 2010   Preop: nonobstructive coronary disease  . CARDIAC SURGERY  12/2008   Aortic & Mitral Valve Repair (Dr. Roxy Manns)  . CESAREAN SECTION    . DOPPLER ECHOCARDIOGRAPHY  May 2012    EF 40-45%, inferior hypokinesis. Slightly increased transaortic velocity = mild AS with mild to moderate AI normal MV gradients with no MR. Significantly improved PA pressures. Paradoxical septal motion.  [name] MITRAL VALVE REPAIR  August 2010    26 mm Sorin [facility] 3D Ring Annuloplasty  . RIGHT/LEFT HEART CATH  AND CORONARY ANGIOGRAPHY N/A 07/08/2016   Procedure: Right/Left Heart Cath and Coronary Angiography;  Surgeon: Leonie Man, MD;  Location: [facility];  Service: Cardiovascular;  Laterality: N/A;  . TEE WITHOUT CARDIOVERSION N/A 07/13/2016   Procedure: Transesophageal Echocardiogram (TEE);  Surgeon: [name], MD;  Location: [facility];  Service: Cardiovascular;  Laterality: N/A;    . THYROID SURGERY    . TRANSTHORACIC ECHOCARDIOGRAM  05/2016   EF 25-30%. Moderate aortic stenosis with moderate regurgitation.    Family History  Problem Relation Age of Onset  . Heart disease Mother   . CVA Mother     Social History   Social History  . Marital status: Single    Spouse name: N/A  . Number of children: N/A  . Years of education: N/A   Occupational History  . Not on file.   Social History Main Topics  . Smoking status: Former Research scientist (life sciences)  . Smokeless tobacco: Never Used     Comment: quit 2011  . Alcohol use No  . Drug use: No  . Sexual activity: Not Currently   Other Topics Concern  . Not on file   Social History Narrative   Unemployed, on disability.   Now is finally reestablished with a PCP.   She is a former smoker, quit in 2011. Denies alcohol consumption   Tries to walk routinely.    Current Outpatient Prescriptions  Medication Sig Dispense Refill  . albuterol (PROVENTIL HFA;VENTOLIN HFA) 108 (90 BASE) MCG/ACT inhaler Inhale 2 puffs into the lungs every 6 (six) hours as needed for shortness of breath.     [name] aspirin 81 MG chewable tablet Chew 81 mg by mouth daily.     . beclomethasone (QVAR) 40 MCG/ACT inhaler Inhale 2 puffs into the lungs daily.     . cycloSPORINE (RESTASIS) 0.05 % ophthalmic emulsion Place 1 drop into both eyes 2 (two) times daily.    . furosemide (LASIX) 40 MG tablet Take 2 tabs in AM and 1 tab in PM 90 tablet 3  . gabapentin (NEURONTIN) 100 MG capsule Take 100 mg by mouth daily.   0  . hydrocortisone 2.5 % cream Apply 1 application topically daily as needed (itching).     [name] levothyroxine (SYNTHROID, LEVOTHROID) 112 MCG tablet Take 112 mcg by mouth daily.    . metolazone (ZAROXOLYN) 2.5 MG tablet Take 1 tablet (2.5 mg total) by mouth daily as needed. 15 tablet 11  . potassium chloride SA (K-DUR,KLOR-CON) 20 MEQ tablet Take 1 tablet (20 mEq total) by mouth daily. 30 tablet 3  . rosuvastatin (CRESTOR) 20 MG tablet Take 20 mg by  mouth daily.   0  . sacubitril-valsartan (ENTRESTO) 24-26 MG Take 1 tablet by mouth 2 (two) times daily. 60 tablet 3  . spironolactone (ALDACTONE) 25 MG tablet Take 0.5 tablets (12.5 mg total) by mouth daily. 15 tablet 3   No current facility-administered medications for this visit.     No Known Allergies    Review of Systems:   General:  normal appetite, decreased energy, no weight gain, + weight loss, no fever  Cardiac:  + chest pain with exertion, no chest pain at rest, + SOB with mild exertion, no resting SOB, + PND, + orthopnea, no palpitations, no arrhythmia, no atrial fibrillation, + LE edema, + dizzy spells, no syncope  Respiratory:  + shortness of breath, no home oxygen, no productive cough, + dry cough, NO bronchitis, NO wheezing, no hemoptysis, no asthma, no pain  with inspiration or cough, no reported history of sleep apnea but she has never been tested, she snores at night, no CPAP at night  GI:   no difficulty swallowing, no reflux, no frequent heartburn, no hiatal hernia, no abdominal pain, + constipation, no diarrhea, occasional hematochezia from hemorrhoids, no hematemesis, no melena  GU:   no dysuria,  no frequency, no urinary tract infection, no hematuria, no kidney stones, no kidney disease  Vascular:  no pain suggestive of claudication, no pain in feet, no leg cramps, no varicose veins, no DVT, no non-healing foot ulcer  Neuro:   no stroke, no TIA's, no seizures, no headaches, no temporary blindness one eye,  no slurred speech, no peripheral neuropathy, no chronic pain, no instability of gait, no memory/cognitive dysfunction  Musculoskeletal: no arthritis, no joint swelling, no myalgias, no difficulty walking, normal mobility   Skin:   no rash, no itching, no skin infections, no pressure sores or ulcerations  Psych:   no anxiety, no depression, no nervousness, no unusual recent stress  Eyes:   + blurry vision, no floaters, no recent vision changes, does not wear glasses or  contacts  ENT:   no hearing loss, edentulous, + dentures but doesn't wear them because they don't fit well, last saw dentist 2010  Hematologic:  no easy bruising, no abnormal bleeding, no clotting disorder, no frequent epistaxis  Endocrine:  no diabetes, does not check CBG's at home     Physical Exam:   BP 112/70   Pulse 69   Resp 20   Ht 4' 9"  (1.448 m)   Wt 180 lb (81.6 kg)   LMP 03/06/2012   SpO2 98% Comment: RA  BMI 38.95 kg/m   General:  Morbidly obese, o/w  well-appearing  HEENT:  Unremarkable   Neck:   no JVD, no bruits, no adenopathy   Chest:   clear to auscultation, symmetrical breath sounds, no wheezes, no rhonchi   CV:   RRR, grade III/VI systolic murmur best LLSB and apex   Abdomen:  soft, non-tender, no masses   Extremities:  warm, well-perfused, pulses not palpable, no LE edema  Rectal/GU  Deferred  Neuro:   Grossly non-focal and symmetrical throughout  Skin:   Clean and dry, no rashes, no breakdown   Diagnostic Tests:  Transthoracic Echocardiography  Patient:    [name], [name] MR #:       [id number] Study Date: 06/15/2016 Gender:     F Age:        23 Height:     144.8 cm Weight:     82.9 kg BSA:        1.88 m^2 Pt. Status: Room:   SONOGRAPHER  Cindy Hazy, RDCS  ATTENDING    Glenetta Hew, MD  ORDERING     Glenetta Hew, MD  [place], MD  PERFORMING   [facility], Outpatient  cc:  ------------------------------------------------------------------- LV EF: 25% -   30%  ------------------------------------------------------------------- Indications:      I35.9 Aortic Valve Disorder.  I05.9 Mitral Valve Disorder.  ------------------------------------------------------------------- History:   PMH:  Acquired from the patient and from the patient&'s chart.  PMH:  Chronic Systolic Heart Failure. Takotsubo Cardiomyopathy. Thyroid Cancer.  ------------------------------------------------------------------- Study  Conclusions  - Left ventricle: The cavity size was mildly dilated. Wall   thickness was normal. Diffuse hypokinesis with regional   variation. Inferolateral, inferior, and inferoseptal severe   hypokinesis. Indeterminant diastolic function. Systolic function   was severely reduced. The estimated  ejection fraction was in the   range of 25% to 30%. - Aortic valve: Status post aortic valve repair. There was moderate   stenosis. There was moderate regurgitation. Mean gradient (S): 33   mm Hg. - Mitral valve: Status post mitral valve repair. Elevated mean   gradient across the mitral valve but pressure half-time not   prolonged, so probably not significant stenosis. There was mild   regurgitation (cannot rule out more significant mitral   regurgitation concealed by shadowing from the annuloplasty ring   and raising the mean gradient across the valve due to high flow).   Mean gradient (D): 8 mm Hg. Valve area by pressure half-time:   2.56 cm^2. - Left atrium: The atrium was moderately dilated. - Right ventricle: The cavity size was normal. Systolic function   was moderately reduced. - Right atrium: The atrium was mildly dilated. - Tricuspid valve: Peak RV-RA gradient (S): 45 mm Hg. - Pulmonary arteries: PA peak pressure: 48 mm Hg (S). - Inferior vena cava: The vessel was normal in size. The   respirophasic diameter changes were in the normal range (= 50%),   consistent with normal central venous pressure.  Impressions:  - Mildly dilated LV with wall motion abnormalities as noted above.   EF 25-30%. Normal RV size with moderately decreased systolic   function. Status post aortic valve repair with moderate   regurgitation and moderate stenosis. Status post mitral valve   repair with elevated mean gradient but normal pressure half-time.   It is possible that there is significant mitral regurgitation   that is poorly visualized due to shadowing of the valve (and   raising the mean  gradient across the valve due to high flow).   Mild to moderate pulmonary hypertension.  ------------------------------------------------------------------- Labs, prior tests, procedures, and surgery: Status post Mitral Valve repair 55m Sorin MEMO 3D Ring Annuloplasty. Status post Aortic Valve Repair.  ------------------------------------------------------------------- Study data:   Study status:  Routine.  Procedure:  The patient reported no pain pre or post test. Transthoracic echocardiography for left ventricular function evaluation, for right ventricular function evaluation, and for assessment of valvular function. Image quality was adequate.  Study completion:  There were no complications.          Transthoracic echocardiography.  M-mode, complete 2D, spectral Doppler, and color Doppler.  Birthdate: Patient birthdate: [birth year]  Age:  Patient is 56yr old.  Sex: Gender: female.    BMI: 39.5 kg/m^2.  Blood pressure:     124/62 Patient status:  Outpatient.  Study date:  Study date: 06/15/2016. Study time: 02:25 PM.  Location:  [facility]  -------------------------------------------------------------------  ------------------------------------------------------------------- Left ventricle:  The cavity size was mildly dilated. Wall thickness was normal. Diffuse hypokinesis with regional variation. Inferolateral, inferior, and inferoseptal severe hypokinesis. Indeterminant diastolic function. Systolic function was severely reduced. The estimated ejection fraction was in the range of 25% to 30%.  ------------------------------------------------------------------- Aortic valve:  Status post aortic valve repair.  Trileaflet; mildly calcified leaflets.  Doppler:   There was moderate stenosis. There was moderate regurgitation.    VTI ratio of LVOT to aortic valve: 0.23. Valve area (VTI): 0.79 cm^2. Indexed valve area (VTI): 0.42 cm^2/m^2. Peak velocity ratio of LVOT to  aortic valve: 0.2. Valve area (Vmax): 0.68 cm^2. Indexed valve area (Vmax): 0.36 cm^2/m^2. Mean velocity ratio of LVOT to aortic valve: 0.22. Valve area (Vmean): 0.76 cm^2. Indexed valve area (Vmean): 0.41 cm^2/m^2.    Mean gradient (S): 33 mm Hg. Peak  gradient (S): 56 mm Hg.  ------------------------------------------------------------------- Aorta:  Aortic root: The aortic root was normal in size. Ascending aorta: The ascending aorta was normal in size.  ------------------------------------------------------------------- Mitral valve:  Status post mitral valve repair. Elevated mean gradient across the mitral valve but pressure half-time not prolonged, so probably not significant stenosis.  Doppler:  There was mild regurgitation (cannot rule out more significant mitral regurgitation concealed by shadowing from the annuloplasty ring and raising the mean gradient across the valve due to high flow). Valve area by pressure half-time: 2.56 cm^2. Indexed valve area by pressure half-time: 1.36 cm^2/m^2. Valve area by continuity equation (using LVOT flow): 1.15 cm^2. Indexed valve area by continuity equation (using LVOT flow): 0.61 cm^2/m^2.    Mean gradient (D): 8 mm Hg. Peak gradient (D): 13 mm Hg.  ------------------------------------------------------------------- Left atrium:  The atrium was moderately dilated.  ------------------------------------------------------------------- Right ventricle:  The cavity size was normal. Systolic function was moderately reduced.  ------------------------------------------------------------------- Pulmonic valve:    Structurally normal valve.   Cusp separation was normal.  Doppler:  Transvalvular velocity was within the normal range. There was no regurgitation.  ------------------------------------------------------------------- Tricuspid valve:   Doppler:  There was trivial regurgitation.     ------------------------------------------------------------------- Right atrium:  The atrium was mildly dilated.  ------------------------------------------------------------------- Pericardium:  There was no pericardial effusion.  ------------------------------------------------------------------- Systemic veins: Inferior vena cava: The vessel was normal in size. The respirophasic diameter changes were in the normal range (= 50%), consistent with normal central venous pressure.  ------------------------------------------------------------------- Measurements   Left ventricle                            Value          Reference  LV ID, ED, PLAX chordal           (H)     62.2  mm       43 - 52  LV ID, ES, PLAX chordal           (H)     49.2  mm       23 - 38  LV fx shortening, PLAX chordal    (L)     21    %        >=29  LV PW thickness, ED                       12.1  mm       ---------  IVS/LV PW ratio, ED                       0.75           <=1.3  Stroke volume, 2D                         52    ml       ---------  Stroke volume/bsa, 2D                     28    ml/m^2   ---------  LV e&', lateral                            4.65  cm/s     ---------  LV E/e&', lateral  38.06          ---------  LV e&', medial                             6.49  cm/s     ---------  LV E/e&', medial                           27.27          ---------  LV e&', average                            5.57  cm/s     ---------  LV E/e&', average                          31.78          ---------    Ventricular septum                        Value          Reference  IVS thickness, ED                         9.02  mm       ---------    LVOT                                      Value          Reference  LVOT ID, S                                21    mm       ---------  LVOT area                                 3.46  cm^2     ---------  LVOT ID                                   21     mm       ---------  LVOT peak velocity, S                     73.3  cm/s     ---------  LVOT mean velocity, S                     56.5  cm/s     ---------  LVOT VTI, S                               15.1  cm       ---------  LVOT peak gradient, S                     2     mm Hg    ---------  Stroke volume (SV), LVOT DP  52.3  ml       ---------  Stroke index (SV/bsa), LVOT DP            27.9  ml/m^2   ---------    Aortic valve                              Value          Reference  Aortic valve peak velocity, S             374   cm/s     ---------  Aortic valve mean velocity, S             257   cm/s     ---------  Aortic valve VTI, S                       66.5  cm       ---------  Aortic mean gradient, S                   30    mm Hg    ---------  Aortic peak gradient, S                   56    mm Hg    ---------  VTI ratio, LVOT/AV                        0.23           ---------  Aortic valve area, VTI                    0.79  cm^2     ---------  Aortic valve area/bsa, VTI                0.42  cm^2/m^2 ---------  Velocity ratio, peak, LVOT/AV             0.2            ---------  Aortic valve area, peak velocity          0.68  cm^2     ---------  Aortic valve area/bsa, peak               0.36  cm^2/m^2 ---------  velocity  Velocity ratio, mean, LVOT/AV             0.22           ---------  Aortic valve area, mean velocity          0.76  cm^2     ---------  Aortic valve area/bsa, mean               0.41  cm^2/m^2 ---------  velocity  Aortic regurg pressure half-time          266   ms       ---------    Aorta                                     Value          Reference  Aortic root ID, ED                        30    mm       ---------  Ascending aorta ID, A-P, S                36    mm       ---------    Left atrium                               Value          Reference  LA ID, A-P, ES                            53    mm       ---------  LA ID/bsa, A-P                     (H)     2.82  cm/m^2   <=2.2  LA volume, S                              76    ml       ---------  LA volume/bsa, S                          40.5  ml/m^2   ---------  LA volume, ES, 1-p A4C                    67    ml       ---------  LA volume/bsa, ES, 1-p A4C                35.7  ml/m^2   ---------  LA volume, ES, 1-p A2C                    76    ml       ---------  LA volume/bsa, ES, 1-p A2C                40.5  ml/m^2   ---------    Mitral valve                              Value          Reference  Mitral E-wave peak velocity               177   cm/s     ---------  Mitral A-wave peak velocity               139   cm/s     ---------  Mitral mean velocity, D                   131   cm/s     ---------  Mitral deceleration time          (H)     232   ms       150 - 230  Mitral pressure half-time                 74    ms       ---------  Mitral mean gradient, D                   8     mm Hg    ---------  Mitral peak gradient, D  13    mm Hg    ---------  Mitral E/A ratio, peak                    1.3            ---------  Mitral valve area, PHT, DP                2.56  cm^2     ---------  Mitral valve area/bsa, PHT, DP            1.36  cm^2/m^2 ---------  Mitral valve area, LVOT                   1.15  cm^2     ---------  continuity  Mitral valve area/bsa, LVOT               0.61  cm^2/m^2 ---------  continuity  Mitral annulus VTI, D                     45.3  cm       ---------  Mitral regurg VTI, PISA                   174   cm       ---------    Pulmonary arteries                        Value          Reference  PA pressure, S, DP                (H)     48    mm Hg    <=30    Tricuspid valve                           Value          Reference  Tricuspid regurg peak velocity            337   cm/s     ---------  Tricuspid peak RV-RA gradient             45    mm Hg    ---------    Right ventricle                           Value          Reference  RV s&', lateral, S                          8.33  cm/s     ---------  Legend: (L)  and  (H)  mark values outside specified reference range.  ------------------------------------------------------------------- Prepared and Electronically Authenticated by  Loralie Champagne, M.D. 2018-01-24T22:00:51   Right/Left Heart Cath and Coronary Angiography  Conclusion     Hemodynamic findings consistent with severe (secondary /combined) pulmonary hypertension (LVEDP & PCWP ~ 30 mmHg) and mitral valve regurgitation.  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 Slight transpulmonary gradient is still 21 indicating a partial component of primary pulmonary hypertension.  Plan for now would be to discuss with heart failure service for recommendations.   Plan: Increase Lasix dose to 40 twice a day with Zaroxolyn used for when necessary  In the outpatient setting, we will convert from losartan to [facility] She will be discharged today. She has TEE scheduled for next week which will help clarify the extent of valvular disease.   Glenetta Hew, M.D., M.S. Interventional Cardiologist   Pager # [phone] Phone # [phone] [address] [place], [place] [address]    Indications   Valvular cardiomyopathy ([place]) [I42.8 (ICD-10-CM)]  Chronic combined systolic and diastolic CHF, NYHA class 3 (HCC) [I50.42 (ICD-10-CM)]  Angina, class II ([place]) [I20.9 (ICD-10-CM)]  Procedural Details/Technique   Technical Details PCP: Aura Dials, PA-C CARDIOLOGIST: Glenetta Hew, MD.   56 year old woman with a history of mitral and aortic valve repair in 2015 by Dr. Roxy Manns. She had severe mitral regurgitation as well as aortic regurgitation. Her initial postop echocardiogram showed notably improved LVEF, however over the last 2 echocardiograms her EF is steadily dropped. Most  recent one showed EF of roughly 25-30% with worsening valvular disease. She has become increasingly dyspneic with worsening heart failure symptoms and even symptoms concerning for possible angina. She is therefore referred for right and left heart catheterization followed by TEE.  Time Out: Verified patient identification, verified procedure, site/side was marked, verified correct patient position, special equipment/implants available, medications/allergies/relevent history reviewed, required imaging and test results available. Performed.  Access:  Attempted Radial Artery: 6 Fr sheath -- Seldinger technique using Micropuncture Kit with ultrasound guidance was was unsuccessful due to inability to advance wire through the needle. Radial arterial access was abandoned for femoral * RIGHT Common Femoral Artery: 5 Fr Sheath - fluoroscopically guided modified Seldinger Technique  *Right Brachial/Antecubital Vein: The existing 18-gauge IV was exchanged over a wire for a 5Fr Short sheath  Right Heart Catheterization: 5 Fr Gordy Councilman catheter advanced under fluoroscopy with balloon inflated to the RA, RV, then PCWP-PA for hemodynamic measurement. The 0.25 Glidewire was used to help advance the catheter into the right atrium and then also into the pulmonary artery. Simultaneous FA & PA blood gases checked for SaO2% to calculate FICK CO/CI  Catheter removed completely out of the body with balloon deflated.  Left Heart Catheterization: 5 Fr Catheters advanced or exchanged over a J-wire under direct fluoroscopic guidance into the ascending aorta; JR4 catheter advanced first.  LV Hemodynamics (LV Gram): JR4 catheter (was unable to cross with Angled Pigtail Catheter) Right Coronary Artery Cineangiography: JR4 Catheter  Left Coronary Artery Cineangiography: JL4 Catheter    After angiography was completed, the catheter was removed completely out of the body over wire without complication. The pulmonary arterial  catheter was also removed completely out of the body. No palpitations.  Femoral Arterial and Brachial Venous Sheath(s) removed in the PACU holding area with manual pressure for hemostasis.    MEDICATIONS * SQ Lidocaine 56m for tendon radial, 1 mL for brachial, 15 mL for femoral access * Isovue Contrast: 65 mL   Estimated blood loss <50 mL.  During this procedure the patient was administered the following to achieve and maintain moderate conscious sedation: Versed 1 mg, Morphine 25 mg, while the patient's heart rate, blood pressure, and oxygen saturation were continuously monitored. The period of conscious sedation was 69 minutes, of which I was present face-to-face 100% of this time.    Complications   Complications documented before study signed (07/08/2016 12:24 PM EST)    No complications were associated with this study.  Documented by DLeonie Man MD - 07/08/2016 12:16 PM EST    Coronary Findings   Dominance: Right  Left Main  Vessel is large. Vessel is angiographically normal.  Left Anterior Descending  Vessel is angiographically normal. Tapers to a small caliber vessel as it wraps the apex  First Diagonal Branch  Vessel is small in size. Vessel is angiographically normal.  First Septal Branch  Vessel is small in size.  Second Diagonal Branch  Vessel is moderate in size. Vessel is angiographically normal.  Second Septal Branch  Vessel is small in size. Vessel is angiographically normal.  Third Diagonal Branch  Vessel is small in size. Vessel is angiographically normal.  Third Septal Branch  Vessel is small in size.  Left Circumflex  Vessel is angiographically normal. The true circumflex tapers is a very small caliber vessel around the AV groove  First Obtuse Marginal Branch  Vessel is angiographically normal.  Lateral First Obtuse Marginal Branch  Vessel is moderate in size.  Right Coronary Artery  Vessel is normal in caliber and large. Vessel is angiographically  normal.  Acute Marginal Branch  Vessel is small in size.  Right Posterior Descending Artery  Vessel is moderate in size.  Inferior Septal  Vessel is small in size.  Right Posterior Atrioventricular Branch  Vessel is moderate in size. Vessel is angiographically normal.  First Right Posterolateral  Vessel is small in size. Vessel is angiographically normal.  Second Right Posterolateral  Vessel is small in size. Vessel is angiographically normal.  Third Right Posterolateral  Vessel is small in size.  Right Heart   Right Heart Pressures Hemodynamic findings consistent with severe pulmonary hypertension and mitral valve regurgitation. Large V wave noted on PCWP waveform PA pressure/mean: 74/39/52 mmHg PaO2 58%, AO 02 98% Cardiac output/index by Fick: 3.44/1.98 PVR: 489., TPVR 1211; SVR 1723, TSVR 2143    Right Atrium RA pressure/mean: 21/19/18 mmHg.    Right Ventricle RV pressure/EDP: 70/12/18 mmHg.    Wall Motion   No LV gram performed        Left Heart   Left Ventricle Large V wave noted on wedge waveform Fick Cardiac Output 3.44 3.44, Cardiac I1.98-> markedly reduced Left ventricular pressures 147/12/26 mmHg    Mitral Valve The patient has a prosthetic annular ring.    Aortic Valve The patient has a bioprosthetic aortic valve that opens and closes well. No clear gradient noted across the valve.  Peak gradient may be 10-15 mmHg.    Coronary Diagrams   Diagnostic Diagram     Implants     No implant documentation for this case.  PACS Images   Show images for Cardiac catheterization   Link to Procedure Log   Procedure Log    Hemo Data   Flowsheet Row Most Recent Value  Fick Cardiac Output 3.44 L/min  Fick Cardiac Output Index 1.98 (L/min)/BSA  RA A Wave 21 mmHg  RA V Wave 19 mmHg  RA Mean 18 mmHg  RV Systolic Pressure 70 mmHg  RV Diastolic Pressure 12 mmHg  RV EDP 18 mmHg  PA Systolic Pressure 74 mmHg  PA Diastolic Pressure 39 mmHg  PA Mean 52 mmHg  PW  A Wave 28 mmHg  PW V Wave 43 mmHg  PW Mean 31 mmHg  AO Systolic Pressure 644 mmHg  AO Diastolic Pressure 64 mmHg  AO Mean 92 mmHg  LV Systolic Pressure 034 mmHg  LV Diastolic Pressure 22 mmHg  LV EDP 31 mmHg  Arterial Occlusion Pressure Extended Systolic Pressure 742 mmHg  Arterial Occlusion Pressure Extended Diastolic Pressure 62 mmHg  Arterial Occlusion Pressure Extended Mean Pressure 89 mmHg  Left Ventricular Apex Extended Systolic Pressure 387 mmHg  Left Ventricular Apex Extended Diastolic Pressure 12 mmHg  Left Ventricular Apex Extended EDP Pressure 26 mmHg  QP/QS 1  TPVR Index 26.34 HRUI  TSVR Index 46.6 HRUI  PVR SVR Ratio 0.28  TPVR/TSVR Ratio 0.57    Transesophageal Echocardiography  Patient:    Darshay, Deupree MR #:       [id number] Study Date: 07/13/2016 Gender:     F Age:        11 Height:     144.8 cm Weight:     83.6 kg BSA:        1.88 m^2 Pt. Status: Room:   SONOGRAPHER  Florentina Jenny, RDCS  ORDERING     Lyman Bishop MD  PERFORMING   Lyman Bishop MD  cc:  ------------------------------------------------------------------- LV EF: 25% -   30%  ------------------------------------------------------------------- Indications:      V43.3 S/P Heart valve replacement.  ------------------------------------------------------------------- History:   Risk factors:  Hypertension. Dyslipidemia.  ------------------------------------------------------------------- Study Conclusions  - Left ventricle: The cavity size is dilated. Wall thickness was   normal. Systolic function was severely reduced. The estimated   ejection fraction was in the range of 25% to 30%. Diffuse   hypokinesis. - Aortic valve: Trileaflet, thickened rheumatic appearing leaflets   visualized with 2D/3D images - s/p repair. There is moderate   aortic stenosis - AVA 1.1 cm2. Moderate central AI. - Mitral valve: S/p repair with 26 mm annuloplasty ring,   well-visualized with  2D/3D images. There is mild stenosis and   severe, eccentric regurgitation. - Left atrium: The atrium was dilated. No evidence of thrombus in   the atrial cavity or appendage. - Right atrium: The atrium was dilated. - Atrial septum: No defect or patent foramen ovale was identified. - Tricuspid valve: There was mild regurgitation.  Impressions:  - LVEF 25-30%, moderate rheumatic aortic stenosis - AVA around 1.1   cm2 with moderate central AI, s/p mitral annuloplasty repair with   mild stenosis and severe, eccentric MR.  ------------------------------------------------------------------- Study data:   Study status:  Routine.  Consent:  The risks, benefits, and alternatives to the procedure were explained to the patient and informed consent was obtained.  Procedure:  Initial setup. The patient was brought to the laboratory. Surface ECG leads were monitored. Sedation. Conscious sedation was administered by cardiology staff. Transesophageal echocardiography. Topical anesthesia was obtained using viscous lidocaine. A transesophageal probe was inserted by the attending cardiologistwithout difficulty. Image quality was adequate.  Study completion:  The patient tolerated the procedure well. There were no complications. Administered medications:   Fentanyl, 73mg, IV.  Midazolam, 428m IV.          Diagnostic transesophageal echocardiography.  2D and color Doppler.  Birthdate:  Patient birthdate: 0203-21-1962 Age: Patient is [age]r old.  Sex:  Gender: female.    BMI: 39.9 kg/m^2. Blood pressure:     151/65  Patient status:  Inpatient.  Study date:  Study date: 07/13/2016. Study time: 10:31 AM.  Location: Endoscopy.  -------------------------------------------------------------------  ------------------------------------------------------------------- Left ventricle:  The cavity size is dilated. Wall thickness was normal. Systolic function was severely reduced. The estimated ejection  fraction was in the range of 25% to 30%. Diffuse hypokinesis.  ------------------------------------------------------------------- Aortic valve:  Trileaflet, thickened rheumatic appearing leaflets visualized with 2D/3D images - s/p repair. There is moderate aortic stenosis - AVA 1.1 cm2. Moderate central AI.  ------------------------------------------------------------------- Aorta:  The aorta was normal, not dilated, and non-diseased.  -------------------------------------------------------------------  Mitral valve:  S/p repair with 26 mm annuloplasty ring, well-visualized with 2D/3D images. There is mild stenosis and severe, eccentric regurgitation.  Doppler:     Mean gradient (D): 7 mm Hg.  ------------------------------------------------------------------- Left atrium:  The atrium was dilated.  No evidence of thrombus in the atrial cavity or appendage.  ------------------------------------------------------------------- Atrial septum:  No defect or patent foramen ovale was identified.   ------------------------------------------------------------------- Right ventricle:  Poorly visualized.  ------------------------------------------------------------------- Pulmonic valve:   Poorly visualized.  Doppler:  There was trivial regurgitation.  ------------------------------------------------------------------- Tricuspid valve:   Doppler:  There was mild regurgitation.  ------------------------------------------------------------------- Right atrium:  The atrium was dilated.  ------------------------------------------------------------------- Pericardium:  There was no pericardial effusion.   ------------------------------------------------------------------- Post procedure conclusions Ascending Aorta:  - The aorta was normal, not dilated, and non-diseased.  ------------------------------------------------------------------- Measurements   Mitral valve                  Value  Mitral mean velocity, D      126   cm/s  Mitral mean gradient, D      7     mm Hg  Mitral annulus VTI, D        60.9  cm  Legend: (L)  and  (H)  mark values outside specified reference range.  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 She presents with acute exacerbation of chronic congestive heart failure with significant progression of disease over the past several months. I have personally reviewed the patient's recent transthoracic and transesophageal echocardiograms and diagnostic cardiac catheterization. Her underlying rheumatic pathology has progressed and she now has moderate aortic stenosis, moderate aortic insufficiency, mild-to-moderate mitral stenosis and severe mitral regurgitation.  Perhaps most importantly, left ventricular systolic function has definitely dropped with a significant fall in ejection fraction and increase in left ventricular chamber size. The recurrence of mitral regurgitation appears to be primarily secondary to type IIIB restriction of leaflet mobility in association with the patient's worsening left ventricular function.  Options include long-term medical therapy versus high risk redo aortic and mitral valve replacement. Risks associated with redo surgery would be extremely high because of the degree of left ventricular systolic dysfunction, pulmonary hypertension, and the patient's somewhat debilitated physical condition. Palliative edge to edge mitral clip could be considered to deal with the severe mitral regurgitation, but the  patient has a relatively small sized mitral annuloplasty ring in place with hemodynamics already consistent with at least mild to moderate mitral stenosis. There are no commercially available transcatheter-based options to deal with the patient's aortic valve disease at this time.  I'm doubtful that the patient would be considered candidate for cardiac transplantation and/or long-term mechanical circulatory support. At present the patient reports that she is doing somewhat better over the past several weeks since her medications have been adjusted. Long-term medical therapy certainly remains an option, but her overall prognosis would probably be fairly poor.   Plan:  I discussed matters at length with the patient and her daughter in the office today. We discussed treatment options including continued medical therapy versus high risk redo surgery. We discussed the relatively guarded prognosis that would be associated with long-term medical therapy with expectation that she would likely deteriorate and  probably not live more than 1-2 years at most without surgical intervention. We discussed the reasons why redo surgery would be associated with extremely high risks. We also discussed surgical options if I risk surgery were to be performed, including whether or not we would use low-profile mechanical prosthetic valves with the subsequent attendant need for long-term anticoagulation with Coumadin versus use of some type of bioprosthetic tissue valve with their associated problems with late structural valve deterioration and failure. All of her questions have been addressed.  The patient wants to think matters over and discuss them with other family members before making any decisions. We will proceed with cardiac gated CT angiogram of the heart to more accurately assess the overall size of the aortic root, the proximity of the left main and right coronary arteries to the aortic valve annulus, and other anatomical  concerns which might affect surgical options. In addition we will obtain CT angiogram of the chest, abdomen, and pelvis to evaluate the feasibility of peripheral cannulation for surgery if necessary. The patient will return to our office in 4 weeks to discuss treatment options further. During the interim period of time she will continue to follow-up closely in the advanced heart failure clinic, and we will specifically ask the advanced heart failure team to comment as to whether not the patient would ever be considered candidate for cardiac transplantation and/or mechanical circulatory support in the event that she were to deteriorate further.   I spent in excess of 90 minutes during the conduct of this office consultation and >50% of this time involved direct face-to-face encounter with the patient for counseling and/or coordination of their care.    Valentina Gu. Roxy Manns, MD 07/21/2016 3:37 PM

## 2016-07-21 NOTE — Patient Instructions (Signed)
Continue all previous medications without any changes at this time  

## 2016-07-22 ENCOUNTER — Encounter (HOSPITAL_COMMUNITY): Payer: Self-pay

## 2016-07-22 ENCOUNTER — Ambulatory Visit (HOSPITAL_COMMUNITY)
Admission: RE | Admit: 2016-07-22 | Discharge: 2016-07-22 | Disposition: A | Discharge status: Home / Self Care | Payer: Medicaid Other | Source: Ambulatory Visit | Attending: Cardiology | Admitting: Cardiology

## 2016-07-22 VITALS — BP 124/60 | HR 60 | Wt 185.0 lb

## 2016-07-22 DIAGNOSIS — I429 Cardiomyopathy, unspecified: Secondary | ICD-10-CM | POA: Diagnosis not present

## 2016-07-22 DIAGNOSIS — Z87891 Personal history of nicotine dependence: Secondary | ICD-10-CM | POA: Diagnosis not present

## 2016-07-22 DIAGNOSIS — E785 Hyperlipidemia, unspecified: Secondary | ICD-10-CM | POA: Insufficient documentation

## 2016-07-22 DIAGNOSIS — I428 Other cardiomyopathies: Secondary | ICD-10-CM | POA: Diagnosis not present

## 2016-07-22 DIAGNOSIS — I110 Hypertensive heart disease with heart failure: Secondary | ICD-10-CM | POA: Diagnosis not present

## 2016-07-22 DIAGNOSIS — I340 Nonrheumatic mitral (valve) insufficiency: Secondary | ICD-10-CM | POA: Insufficient documentation

## 2016-07-22 DIAGNOSIS — I5022 Chronic systolic (congestive) heart failure: Secondary | ICD-10-CM | POA: Insufficient documentation

## 2016-07-22 DIAGNOSIS — Z8249 Family history of ischemic heart disease and other diseases of the circulatory system: Secondary | ICD-10-CM | POA: Insufficient documentation

## 2016-07-22 DIAGNOSIS — E039 Hypothyroidism, unspecified: Secondary | ICD-10-CM | POA: Diagnosis not present

## 2016-07-22 DIAGNOSIS — I5042 Chronic combined systolic (congestive) and diastolic (congestive) heart failure: Secondary | ICD-10-CM

## 2016-07-22 DIAGNOSIS — Z823 Family history of stroke: Secondary | ICD-10-CM | POA: Insufficient documentation

## 2016-07-22 DIAGNOSIS — Z7982 Long term (current) use of aspirin: Secondary | ICD-10-CM | POA: Diagnosis not present

## 2016-07-22 LAB — BASIC METABOLIC PANEL
ANION GAP: 12 (ref 5–15)
BUN: 21 mg/dL — ABNORMAL HIGH (ref 6–20)
CALCIUM: 9.3 mg/dL (ref 8.9–10.3)
CHLORIDE: 102 mmol/L (ref 101–111)
CO2: 24 mmol/L (ref 22–32)
Creatinine, Ser: 1.15 mg/dL — ABNORMAL HIGH (ref 0.44–1.00)
GFR calc non Af Amer: 53 mL/min — ABNORMAL LOW (ref >60)
GLUCOSE: 96 mg/dL (ref 65–99)
Potassium: 4 mmol/L (ref 3.5–5.1)
Sodium: 138 mmol/L (ref 135–145)

## 2016-07-22 LAB — BRAIN NATRIURETIC PEPTIDE: B Natriuretic Peptide: 383.9 pg/mL — ABNORMAL HIGH (ref 0.0–100.0)

## 2016-07-22 MED ORDER — SACUBITRIL-VALSARTAN 49-51 MG PO TABS: 1.0000 | ORAL_TABLET | Freq: Two times a day (BID) | ORAL | 3 refills | Status: DC

## 2016-07-22 MED ORDER — SPIRONOLACTONE 25 MG PO TABS: 25.0000 mg | ORAL_TABLET | Freq: Every day | ORAL | 3 refills | Status: DC

## 2016-07-22 MED ORDER — DIGOXIN 125 MCG PO TABS: 0.1250 mg | ORAL_TABLET | Freq: Every day | ORAL | 3 refills | Status: DC

## 2016-07-22 MED ORDER — POTASSIUM CHLORIDE ER 10 MEQ PO TBCR: 20.0000 meq | EXTENDED_RELEASE_TABLET | Freq: Every day | ORAL | 3 refills | Status: DC

## 2016-07-22 NOTE — Patient Instructions (Signed)
Start Digoxin 0.125 mg daily  Increase Entresto to 49/51 mg Twice daily (you can take 2 of your 24/26 mg tabs until you run out)  Increase Spironolactone 25 mg (1 tab) daily  Labs today  Labs in 10 days  Your physician has recommended that you have a cardiopulmonary stress test (CPX). CPX testing is a non-invasive measurement of heart and lung function. It replaces a traditional treadmill stress test. This type of test provides a tremendous amount of information that relates not only to your present condition but also for future outcomes. This test combines measurements of you ventilation, respiratory gas exchange in the lungs, electrocardiogram (EKG), blood pressure and physical response before, during, and following an exercise protocol.  Your physician recommends that you schedule a follow-up appointment in: 2 weeks

## 2016-07-24 NOTE — Progress Notes (Signed)
PCP: Roe Coombs Cardiology: Dr. Ellyn Hack HF Cardiology: Dr. Aundra Dubin  56 yo with history of rheumatic heart disease s/p aortic and mitral valve repairs in 2010 has developed CHF and presents for CHF clinic evaluation.  She had aortic and mitral repairs in 2010.  Afterwards, EF was mildly low in the 40-45% range.  However, in the last year, EF has dropped.  EF 25-30% on 1/18 echo.  TEE showed severe MR, moderate AI, moderate aortic stenosis.  RHC/LHC in 2/18 showed on significantly elevated LV and RV filling pressures.    When I saw her at initial appointment, she was very dyspneic with exertion, with orthopnea and PND.  At last appointment, I adjusted her meds and increased Lasix.  She has seen Dr. Roxy Manns since that time for evaluation for redo valve surgery.  She would require high risk mitral and aortic valve replacements. Today, she is feeling better.  She was able to walk 10 minutes straight yesterday without dyspnea.  She was able to walk in [place] and do some housework.  Orthopnea has resolved.  Still short of breath with heavier exertion.  No lightheadedness, no palpitations.  Weight down 4 lbs.   Labs (2/18): K 3.8 => 3.4, creatinine 1.09 => 1.07, hgb 12.7, BNP 1010  ECG: NSR, 1st degree AVB, IVCD 134 msec    PMH: 1. HTN 2. Hypothyroidism 3. Hyperlipidemia 4. Rheumatic heart disease: s/p MV repair and aortic valve repair in 2010, Dr. Roxy Manns. - TEE (2/18): EF 35-30%, dilated LV, rheumatic-appearing aortic valve s/p repair with moderate AS (AVA 1.1 cm^2), moderate AI.  MV s/p repeat with mild-moderate mitral stenosis and eccentric severe MR.   5. Chronic systolic CHF: Nonischemic cardiomyopathy, probably related to valvular disease.   - Echo (11/15): EF 40-45%, - Echo (12/16): EF 35-40%. - Echo (1/18): EF 25-30%.  - LHC/RHC (2/18): Normal coronaries, mean RA 18, mean PA 74/39 mean 52, mean PCWP 31, CI 1.98 Fick.  Social History   Social History  . Marital status: Single    Spouse name:  N/A  . Number of children: N/A  . Years of education: N/A   Occupational History  . Not on file.   Social History Main Topics  . Smoking status: Former Research scientist (life sciences)  . Smokeless tobacco: Never Used     Comment: quit 2011  . Alcohol use No  . Drug use: No  . Sexual activity: Not Currently   Other Topics Concern  . Not on file   Social History Narrative   Unemployed, on disability.   Now is finally reestablished with a PCP.   She is a former smoker, quit in 2011. Denies alcohol consumption   Tries to walk routinely.   Family History  Problem Relation Age of Onset  . Heart disease Mother   . CVA Mother    ROS: All systems reviewed and negative except as per HPI.   Current Outpatient Prescriptions  Medication Sig Dispense Refill  . albuterol (PROVENTIL HFA;VENTOLIN HFA) 108 (90 BASE) MCG/ACT inhaler Inhale 2 puffs into the lungs every 6 (six) hours as needed for shortness of breath.     [name] aspirin 81 MG chewable tablet Chew 81 mg by mouth daily.     . beclomethasone (QVAR) 40 MCG/ACT inhaler Inhale 2 puffs into the lungs daily.     . cycloSPORINE (RESTASIS) 0.05 % ophthalmic emulsion Place 1 drop into both eyes 2 (two) times daily.    . furosemide (LASIX) 40 MG tablet Take 2 tabs in AM  and 1 tab in PM 90 tablet 3  . gabapentin (NEURONTIN) 100 MG capsule Take 100 mg by mouth daily.   0  . hydrocortisone 2.5 % cream Apply 1 application topically daily as needed (itching).     [name] levothyroxine (SYNTHROID, LEVOTHROID) 112 MCG tablet Take 112 mcg by mouth daily.    . rosuvastatin (CRESTOR) 20 MG tablet Take 20 mg by mouth daily.   0  . spironolactone (ALDACTONE) 25 MG tablet Take 1 tablet (25 mg total) by mouth daily. 30 tablet 3  . digoxin (LANOXIN) 0.125 MG tablet Take 1 tablet (0.125 mg total) by mouth daily. 30 tablet 3  . metolazone (ZAROXOLYN) 2.5 MG tablet Take 1 tablet (2.5 mg total) by mouth daily as needed. (Patient not taking: Reported on 07/22/2016) 15 tablet 11  . potassium  chloride (K-DUR) 10 MEQ tablet Take 2 tablets (20 mEq total) by mouth daily. 60 tablet 3  . sacubitril-valsartan (ENTRESTO) 49-51 MG Take 1 tablet by mouth 2 (two) times daily. 60 tablet 3   No current facility-administered medications for this encounter.    BP 124/60   Pulse 60   Wt 185 lb (83.9 kg)   LMP 03/06/2012   SpO2 100%   BMI 40.03 kg/m  General: NAD Neck: JVP 8 cm, no thyromegaly or thyroid nodule.  Lungs: Clear to auscultation bilaterally with normal respiratory effort. CV: Nondisplaced PMI.  Heart regular S1/S2, no S3/S4, 3/6 HSM apex, 2/6 SEM RUSB.  Trace ankle edema.  No carotid bruit.  Normal pedal pulses.  Abdomen: Soft, nontender, no hepatosplenomegaly, mild distention.  Skin: Intact without lesions or rashes.  Neurologic: Alert and oriented x 3.  Psych: Normal affect. Extremities: No clubbing or cyanosis.  HEENT: Normal.   Assessment/Plan: 1. 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  She had aortic and mitral valve repairs in 2010.  Now the aortic valve is moderate stenotic and moderately regurgitant, and there is severe eccentric MR with mild to moderate mitral stenosis.  RHC showed elevated filling pressures.  I do not think that she would be a very good Mitraclip candidate as she already has a degree of mitral stenosis.  She has seen Dr. Roxy Manns.  To correct the situation, she would need aortic and mitral valve replacements.  This would be very high risk.  However, surgery will be very high risk for her.  I think that she would do poorly long-term with medical therapy alone.  If she cannot have valve surgery, LVAD as bridge to transplant would be a potential option. For now, will try to optimize her hemodynamically and then reassess situation with Dr. Roxy Manns.  2. Chronic systolic CHF: EF 123XX123.  Nonischemic cardiomyopathy (no significant disease on 2/18 LHC).  It is possible that this cardiomyopathy is driven by her valve disease.  NYHA class II-III symptoms (improved).   Volume status looks better with higher Lasix.  - Continue Lasix 80 qam/40 qpm.  BMET/BNP today.  - Increase Entresto to 49/51 bid, BMET in 10 days.   - Continue spironolactone.  - Add digoxin 0.125 daily.  - She should have CPX.   She will need to followup in 2 weeks.   Loralie Champagne 07/24/2016

## 2016-07-27 ENCOUNTER — Ambulatory Visit (INDEPENDENT_AMBULATORY_CARE_PROVIDER_SITE_OTHER): Payer: Medicaid Other | Admitting: Cardiology

## 2016-07-27 ENCOUNTER — Encounter: Payer: Self-pay | Admitting: Cardiology

## 2016-07-27 DIAGNOSIS — I272 Pulmonary hypertension, unspecified: Secondary | ICD-10-CM

## 2016-07-27 DIAGNOSIS — I428 Other cardiomyopathies: Secondary | ICD-10-CM

## 2016-07-27 DIAGNOSIS — I209 Angina pectoris, unspecified: Secondary | ICD-10-CM

## 2016-07-27 DIAGNOSIS — I5042 Chronic combined systolic (congestive) and diastolic (congestive) heart failure: Secondary | ICD-10-CM | POA: Diagnosis not present

## 2016-07-27 DIAGNOSIS — I340 Nonrheumatic mitral (valve) insufficiency: Secondary | ICD-10-CM | POA: Insufficient documentation

## 2016-07-27 DIAGNOSIS — I062 Rheumatic aortic stenosis with insufficiency: Secondary | ICD-10-CM | POA: Diagnosis not present

## 2016-07-27 DIAGNOSIS — I099 Rheumatic heart disease, unspecified: Secondary | ICD-10-CM

## 2016-07-27 NOTE — Telephone Encounter (Signed)
She should get heart issues resolved before hemorrhoid surgery.

## 2016-07-27 NOTE — Assessment & Plan Note (Signed)
She seems relatively euvolemic today. I think for now she is on stable diuretic regimen. I simply recommended that she take the spironolactone with her afternoon dose of Lasix as opposed to in the morning with Entresto since she is noticing some orthostatic symptoms. I also recommended that she takes the [facility] with or after breakfast as opposed to before. She is not currently on a beta blocker - likely related to her bradycardia and conduction delay noted on EKG.

## 2016-07-27 NOTE — Assessment & Plan Note (Signed)
For long-term survivability, her best option is much about placement with Dr. Roxy Manns. Probably best served with mechanical valve if possible.

## 2016-07-27 NOTE — Telephone Encounter (Signed)
I would tend to agree that she needs to have her cardiac evaluation completed before hemorrhoid problem. She did not mention anything about any hemorrhoidal bleeding in clinic today.  Glenetta Hew c

## 2016-07-27 NOTE — Assessment & Plan Note (Signed)
Progression of disease from original repair. Will likely require aortic valve replacement. Defer to Dr. Roxy Manns for decision reference bioprosthetic versus mechanical. The patient will be fine taking warfarin.

## 2016-07-27 NOTE — Assessment & Plan Note (Signed)
Hopefully this will improve with her valvular surgery.

## 2016-07-27 NOTE — Progress Notes (Signed)
PCP: Aura Dials, PA-C  Clinic Note: Chief Complaint  Patient presents with  . Follow-up    Pt states no Sx.   . Cardiac Valve Problem    Moderate aortic stenosis and regurgitation. Mild mitral stenosis with severe MR.  . Cardiomyopathy    Valvular    HPI: [name] is a 56 y.o. female with a PMH below who presents today for Post cath & TEE follow-up. 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 It wasn't until this past few months that she has become more dyspneic, prompting more detailed evaluation. Concern was that she may require redo valve surgery and more aggressive heart failure management. - Echo (11/15): EF 40-45%, - Echo (12/16): EF 35-40% (asymptomatic & thought to be within reading error) - Echo (1/18): EF 25-30%. -- Moderate aortic stenosis with moderate regurgitation. (Mean gradient 33 mmHg). Difficult to assess elevated pressures across mitral valve with also evidence of mitral regurgitation. Cannot fully assess. Mean gradient roughly 8 mmHg. - Peak PA pressure 48 mmHg. (Recommend TEE)   When I saw her on 07/04/2016, we discussed progression of her valvular disease and cardiomyopathy with an echo showing EF 25-30% and moderate aortic stenosis with moderate regurgitation as well as also mitral valve stenosis with mild regurgitation.  She herself was noticing worsening dyspnea, edema with PND. She is getting short of breath walking into the clinic building. I scheduled her for right and left heart catheterization followed by TEE. The net result of these findings was that she deathly has progression of both aortic and mitral valve disease with severe almost systemic pulmonary arterial pressures with very large V wave indicating significant mitral valve disease.   Studies Reviewed: PSH/PMH updated  LHC/RHC (2/18): Normal coronaries, mean RA 18, RV  pressure/EDP: 70/12/18 mmHg.  mean PA 74/39 mean 52, mean PCWP 31 (with V wave of 45 mmHg), LVEDP ~ 30 mmHg.  CI 1.98 Fick. Peak AoV gradient ~15 mHg   -->   I increased her Lasix to 40 mg twice a day  TEE 2/18 (Dr.Hilty): EF 25-30%, dilated LV - diffuse hypokinesis, rheumatic-appearing aortic valve s/p repair with moderate AS (AVA 1.1 cm^2), moderate central AI.  MV s/p repeat with mild mitral stenosis and eccentric severe MR.    Following her TEE, we have referred her to Dr. Aundra Dubin from the Heart Failure Service (February 23) for assistance with medicine adjustments. She was extremely dyspneic with orthopnea and PND She was switched from her ARB to [facility] (as was my plan), spironolactone was added and her diuretic dose was increased (initially 80 mg BID x 5 days) - settled at 80 mg in the morning and 40 mg at night. In addition to adding spironolactone, digoxin was started as well.  She was also referred to Dr. Roxy Manns, who was her original cardiac surgeon for reassessment (seen on 07/21/2016): The overall just a conversation was that she probably will need to have both aortic and mitral valve replacement. The question is whether this is done with bioprosthetic sore with tissue prosthetics. --> And gated cardiac CTA has been ordered to evaluate the proximity of the coronary ostiae to the aortic valve. The CT will be continued into the chest abdomen and pelvis to evaluate feasibility of peripheral cannulation. --> Follow-up appointment will be beginning of April (as was my intention, she will also be followed by the heart failure service pre-and  perioperatively)  Recent Hospitalizations: Only for cardiac catheterization and TEE  She was last seen by Dr. Aundra Dubin on 3/2: Notably improved from a symptom standpoint. Much less symptoms of PND and orthopnea. Still noticing exertional dyspnea but also much improved. Weight was down.  __________  Interval History: Today she is feeling quite well. She denies  any PND or orthopnea, and significantly reduced exertional dyspnea. She certainly will get short of breath if she does stairs or anything more than simply walking around the house, but this is a dramatic improvement from before. Pain a little of weight according to the scales, but she doesn't think based on her home scales that she has gained much. She has reduced her Lasix dosing to 80 mg in the morning and 40 me evening. She does keep it shortly after taking her Delene Loll she has symptoms positional dizziness that is usually gone by lunchtime. She has not had any syncope or near-syncope Episodes. She denies any of the chest tightness and pressure that she was having with exertion prior to her cardiac catheterization and increased diuresis.  No palpitations, weakness or syncope/near syncope. No TIA/amaurosis fugax symptoms. No claudication.  ROS: A comprehensive was performed. Review of Systems  Constitutional: Negative for malaise/fatigue (Energy is improving).  HENT: Negative for congestion and nosebleeds.   Eyes: Negative for blurred vision.  Respiratory: Positive for shortness of breath (Still has some exertional dyspnea, but notably improved with increased diuresis.). Negative for cough and wheezing.   Cardiovascular: Negative for claudication.  Gastrointestinal: Positive for constipation. Negative for blood in stool (Reportedly was having some hemorrhoidal bleeds and was given a have hemorrhoidal evaluation. She did not mention this to me), heartburn and melena.  Genitourinary: Negative for hematuria.  Musculoskeletal: Positive for joint pain. Negative for myalgias.  Neurological: Positive for dizziness (Sometimes positional especially in the morning.).  Endo/Heme/Allergies: Does not bruise/bleed easily.  Psychiatric/Behavioral: Negative for memory loss. The patient is nervous/anxious (Expectedly anxious).        She seems to be handling this news that is being given to her about her  valvular disease. Much and stride. She seems upset, but is ready to proceed  All other systems reviewed and are negative.   Past Medical History: Abridged  Diagnosis Date  . Chronic combined systolic and diastolic CHF (congestive heart failure) (HCC)    EF now down to 25-30%. LVEDP on cath was 30 mmHg with wedge pressure of 31 mmHg.  [name] Pulmonary hypertension 05/2016   By cardiac catheterization: mean RA 18, RV pressure/EDP: 70/12/18 mmHg.  mean PA 74/39 mean 52, mean PCWP 31 (with V wave of 45 mmHg), LVEDP ~ 30 mmHg.  [name] RAD (reactive airway disease)   . 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 Roxy Manns  Echo 12/2010: EF 40-45%, inferior hypokinesis; Slow progression of valvular Dz & drop in EF --> 05/2016: EF 25-30% (down from 40-45% post-op) with Mod AS/AI, mild MS & Severe MR (Cath & TEE 06/2016)   . S/P aortic valve repair 32/95/1884   suture plication of 3 commissures  . S/P MVR (mitral valve repair) 12/23/2008   26 mm Sorin MEMO 3D Ring Annuloplasty  . Valvular cardiomyopathy ([place]) 12/2008   EF progressively worsened from 40-45% down to 25-30% by February 2018. Progressive worsening of aortic and mitral valve disease.    Past Surgical History:  Procedure Laterality Date  . AORTIC VALVE REPAIR  August  7169   Suture plication of all 3 commissures  . CARDIAC CATHETERIZATION  August 2010   Preop: nonobstructive coronary disease  . CARDIAC SURGERY  12/2008   Aortic & Mitral Valve Repair (Dr. Roxy Manns)  . CESAREAN SECTION    . MITRAL VALVE REPAIR  August 2010    26 mm Sorin [facility] 3D Ring Annuloplasty  . RIGHT/LEFT HEART CATH AND CORONARY ANGIOGRAPHY N/A 07/08/2016   Procedure: Right/Left Heart Cath and Coronary Angiography;  Surgeon: Leonie Man, MD;  Location: [facility];  Service: Cardiovascular: Normal coronaries, mean RA 18, RV pressure/EDP: 70/12/18 mmHg.  mean PA 74/39 mean 52, mean PCWP 31 (with V wave of 45  mmHg), LVEDP ~ 30 mmHg.  CI 1.98 Fick. Peak AoV gradient ~15 mHg    . TEE WITHOUT CARDIOVERSION N/A 07/13/2016   Procedure: Transesophageal Echocardiogram (TEE);  Surgeon: [name], MD;  Location: [facility] ENDOSCOPY;  Service: Cardiovascular:  EF 25-30%, dilated LV - diffuse hypokinesis, rheumatic-appearing Aortic Vallve s/p repair with moderate AS (AVA 1.1 cm^2), moderate central AI.  MV s/p repeat with mild mitral stenosis and eccentric severe MR.    . TEE WITHOUT CARDIOVERSION    . THYROID SURGERY    . TRANSTHORACIC ECHOCARDIOGRAM  05/2016   EF 25-30%. Moderate aortic stenosis with moderate regurgitation. Also potential aortic stenosis with likely worse than expected regurgitation  . TRANSTHORACIC ECHOCARDIOGRAM  May 2012    EF 40-45%, inferior hypokinesis. Slightly increased transaortic velocity = mild AS with mild to moderate AI normal MV gradients with no MR. Significantly improved PA pressures. Paradoxical septal motion.    Current Meds  Medication Sig  . albuterol (PROVENTIL HFA;VENTOLIN HFA) 108 (90 BASE) MCG/ACT inhaler Inhale 2 puffs into the lungs every 6 (six) hours as needed for shortness of breath.   [name] aspirin 81 MG chewable tablet Chew 81 mg by mouth daily.   . beclomethasone (QVAR) 40 MCG/ACT inhaler Inhale 2 puffs into the lungs daily.   . cycloSPORINE (RESTASIS) 0.05 % ophthalmic emulsion Place 1 drop into both eyes 2 (two) times daily.  . digoxin (LANOXIN) 0.125 MG tablet Take 1 tablet (0.125 mg total) by mouth daily.  . furosemide (LASIX) 40 MG tablet Take 2 tabs in AM and 1 tab in PM  . gabapentin (NEURONTIN) 100 MG capsule Take 100 mg by mouth daily.   . hydrocortisone 2.5 % cream Apply 1 application topically daily as needed (itching).   [name] levothyroxine (SYNTHROID, LEVOTHROID) 112 MCG tablet Take 112 mcg by mouth daily.  . metolazone (ZAROXOLYN) 2.5 MG tablet Take 1 tablet (2.5 mg total) by mouth daily as needed.  . potassium chloride (K-DUR) 10 MEQ tablet Take 2  tablets (20 mEq total) by mouth daily.  . rosuvastatin (CRESTOR) 20 MG tablet Take 20 mg by mouth daily.   . sacubitril-valsartan (ENTRESTO) 49-51 MG Take 1 tablet by mouth 2 (two) times daily.  [name] spironolactone (ALDACTONE) 25 MG tablet Take 1 tablet (25 mg total) by mouth daily.    No Known Allergies  Social History   Social History  . Marital status: Single    Spouse name: N/A  . Number of children: N/A  . Years of education: N/A   Social History Main Topics  . Smoking status: Former Research scientist (life sciences)  . Smokeless tobacco: Never Used     Comment: quit 2011  . Alcohol use No  . Drug use: No  . Sexual activity: Not Currently   Other Topics Concern  . None  Social History Narrative   Unemployed, on disability.   Now is finally reestablished with a PCP.   She is a former smoker, quit in 2011. Denies alcohol consumption   Tries to walk routinely.    family history includes CVA in her mother; Heart disease in her mother.  Wt Readings from Last 3 Encounters:  07/27/16 83.5 kg (184 lb)  07/22/16 83.9 kg (185 lb)  07/21/16 81.6 kg (180 lb)    PHYSICAL EXAM BP 91/61   Pulse (!) 54   Ht 4\' 9"  (1.448 m)   Wt 83.5 kg (184 lb)   LMP 03/06/2012   BMI 39.82 kg/m  General appearance: alert, cooperative, appears stated age, no distress and Moderately obese Neck: no adenopathy, no carotid bruit and JVD 8-10 cm water Lungs: clear to auscultation bilaterally, normal percussion bilaterally and non-labored Heart: RRR with normal S1 and S2. No actual gallop noted. There is a harsh 3/6 SEM at RUSB radiating to carotids. There is also a soft 1-2/6 DM at RUSB. This is in addition to a blowing 3/6 HSM at the apex.  Abdomen: soft, non-tender; mildly protuberant, but no distention. bowel sounds normal; no masses,  no organomegaly; minimal HJR Extremities: extremities normal, atraumatic, no cyanosis, and edema trivial Pulses: 2+ and symmetric;  Skin: mobility and turgor normal, no evidence of  bleeding or bruising and no lesions noted  Neurologic: Mental status: Alert, oriented, thought content appropriate; very pleasant mood and affect    Adult ECG Report N/a   Other studies Reviewed: Additional studies/ records that were reviewed today include:  Recent Labs:   Lab Results  Component Value Date   CREATININE 1.15 (H) 07/22/2016   BUN 21 (H) 07/22/2016   NA 138 07/22/2016   K 4.0 07/22/2016   CL 102 07/22/2016   CO2 24 07/22/2016    ASSESSMENT / PLAN:  Clear progression of both aortic and mitral valve disease. Also progression of cardiomyopathy.  The majority of this visit was mostly discussing the recent studies, and her consultations with heart failure service and surgery. Minimal medical management decisions are made other than adjusting dose intervals. I spent over  30 minutes with the patient and greater than 50% of time was spent in counseling her about the progression of disease and treatment options and the plans going forward.  My plan for now is to defer management of her valvular are myopathy to Dr. Roxy Manns and Dr. Aundra Dubin until she is stable postoperatively.  I explained this to her as my intention to allow for the specialists to manage her care. She is happy with my recommendations. The patient will be fine taking warfarin.  Problem List Items Addressed This Visit    Angina, class II ([place])    Likely related to severely elevated intracardiac pressures and pulmonary hypertension. Notably improved with diuresis. Most likely microvascular ischemia as opposed to macrovascular given her normal coronary arteries.      Aortic valve stenosis and insufficiency, rheumatic (Chronic)    Progression of disease from original repair. Will likely require aortic valve replacement. Defer to Dr. Roxy Manns for decision reference bioprosthetic versus mechanical. The patient will be fine taking warfarin.      Chronic combined systolic and diastolic CHF (congestive heart failure) (HCC)  (Chronic)    She seems relatively euvolemic today. I think for now she is on stable diuretic regimen. I simply recommended that she take the spironolactone with her afternoon dose of Lasix as opposed to in the morning with Delene Loll  since she is noticing some orthostatic symptoms. I also recommended that she takes the [facility] with or after breakfast as opposed to before. She is not currently on a beta blocker - likely related to her bradycardia and conduction delay noted on EKG.      Pulmonary hypertension (Chronic)    Hopefully this will improve with her valvular surgery.      Rheumatic heart valve disease (Chronic)    Progression of disease as noted. Now under the care of Dr. Aundra Dubin and Dr. Roxy Manns. I will see her postoperatively, but would ask that Dr. Aundra Dubin continue to monitor her until she has stabilized. Dr. Roxy Manns is asked for the heart failure team to assist in managing her perioperatively.      Severe mitral regurgitation    For long-term survivability, her best option is much about placement with Dr. Roxy Manns. Probably best served with mechanical valve if possible.      Valvular cardiomyopathy (HCC) (Chronic)    Clearly nonischemic with normal coronary arteries. Progressive aortic and mitral valve disease.  Following cardiac catheterization, I discussed the patient with Dr. Aundra Dubin who then graciously saw the patient following her TEE demonstrated severe MR which was suggested by the catheterization. I appreciate his diligence in adjusting medications. At this point, she is undergoing evaluation for redo valvular surgery now likely replacement. We discussed mechanical versus bioprosthetic issues that she had talked about with Dr. Roxy Manns. At this point I think she is leaving the trust in to Dr. Ricard Dillon recommendations.         Current medicines are reviewed at length with the patient today. (+/- concerns) AM dizziness  The following changes have been made: no dose changes --> just  time changes & sliding scale lasix  Patient Instructions  NO CHANGE WITH MEDICATION EXCEPT:  TAKE ENTRESTO AFTER YOU EAT BREAKFAST  TAKE SPIROLACTONE AT [place] (LASIX)    Your physician wants you to follow-up as scheduled with Dr. Aundra Dubin and Dr. Roxy Manns. He will then follow-up in July with DR [facility] (postoperatively). You will receive a reminder letter in the mail two months in advance. If you don't receive a letter, please call our office to schedule the follow-up appointment    If you need a refill on your cardiac medications before your next appointment, please call your pharmacy.    Studies Ordered:   No orders of the defined types were placed in this encounter.     Glenetta Hew, M.D., M.S. Interventional Cardiologist   Pager # [phone] Phone # [phone] [address] [place], [place] [address]

## 2016-07-27 NOTE — Assessment & Plan Note (Signed)
Likely related to severely elevated intracardiac pressures and pulmonary hypertension. Notably improved with diuresis. Most likely microvascular ischemia as opposed to macrovascular given her normal coronary arteries.

## 2016-07-27 NOTE — Assessment & Plan Note (Signed)
Progression of disease as noted. Now under the care of Dr. Aundra Dubin and Dr. Roxy Manns. I will see her postoperatively, but would ask that Dr. Aundra Dubin continue to monitor her until she has stabilized. Dr. Roxy Manns is asked for the heart failure team to assist in managing her perioperatively.

## 2016-07-27 NOTE — Assessment & Plan Note (Addendum)
Clearly nonischemic with normal coronary arteries. Progressive aortic and mitral valve disease.  Following cardiac catheterization, I discussed the patient with Dr. Aundra Dubin who then graciously saw the patient following her TEE demonstrated severe MR which was suggested by the catheterization. I appreciate his diligence in adjusting medications. At this point, she is undergoing evaluation for redo valvular surgery now likely replacement. We discussed mechanical versus bioprosthetic issues that she had talked about with Dr. Roxy Manns. At this point I think she is leaving the trust in to Dr. Ricard Dillon recommendations.

## 2016-07-27 NOTE — Patient Instructions (Addendum)
NO CHANGE WITH MEDICATION EXCEPT:  TAKE ENTRESTO AFTER YOU EAT BREAKFAST  TAKE SPIROLACTONE AT LUNCH TIME WITH FUROSEMIDE (LASIX)    Your physician wants you to follow-up as scheduled with Dr. Aundra Dubin and Dr. Roxy Manns. He will then follow-up in July with DR [facility] (postoperatively). You will receive a reminder letter in the mail two months in advance. If you don't receive a letter, please call our office to schedule the follow-up appointment    If you need a refill on your cardiac medications before your next appointment, please call your pharmacy.

## 2016-07-28 NOTE — Telephone Encounter (Signed)
Heart trumps Rhoids.  Thanks for seeing her -- keep that [place] tracing with the PCWP V wave of 45 mHg - great for educational purposes.  [place]

## 2016-08-01 ENCOUNTER — Telehealth (HOSPITAL_COMMUNITY): Payer: Self-pay | Admitting: *Deleted

## 2016-08-01 ENCOUNTER — Ambulatory Visit (HOSPITAL_COMMUNITY)
Admission: RE | Admit: 2016-08-01 | Discharge: 2016-08-01 | Disposition: A | Discharge status: Home / Self Care | Payer: Medicaid Other | Source: Ambulatory Visit | Attending: Internal Medicine | Admitting: Internal Medicine

## 2016-08-01 DIAGNOSIS — I5042 Chronic combined systolic (congestive) and diastolic (congestive) heart failure: Secondary | ICD-10-CM | POA: Diagnosis not present

## 2016-08-01 DIAGNOSIS — I5022 Chronic systolic (congestive) heart failure: Secondary | ICD-10-CM

## 2016-08-01 LAB — BASIC METABOLIC PANEL
ANION GAP: 10 (ref 5–15)
BUN: 66 mg/dL — ABNORMAL HIGH (ref 6–20)
CALCIUM: 9.9 mg/dL (ref 8.9–10.3)
CO2: 28 mmol/L (ref 22–32)
Chloride: 97 mmol/L — ABNORMAL LOW (ref 101–111)
Creatinine, Ser: 2.43 mg/dL — ABNORMAL HIGH (ref 0.44–1.00)
GFR, EST AFRICAN AMERICAN: 25 mL/min — AB (ref >60)
GFR, EST NON AFRICAN AMERICAN: 21 mL/min — AB (ref >60)
Glucose, Bld: 85 mg/dL (ref 65–99)
Potassium: 4.4 mmol/L (ref 3.5–5.1)
SODIUM: 135 mmol/L (ref 135–145)

## 2016-08-01 MED ORDER — FUROSEMIDE 40 MG PO TABS: 80.0000 mg | ORAL_TABLET | Freq: Every day | ORAL | 3 refills | Status: DC

## 2016-08-01 MED ORDER — SACUBITRIL-VALSARTAN 24-26 MG PO TABS: 1.0000 | ORAL_TABLET | Freq: Two times a day (BID) | ORAL | 3 refills | Status: DC

## 2016-08-01 NOTE — Telephone Encounter (Signed)
-----   Message from Larey Dresser, MD sent at 08/01/2016  2:53 PM EDT ----- Hold Lasix x 2 days, then decrease to 80 mg daily.  Hold Entresto x 2 days, then decrease to 24/26 bid.  Hold digoxin x 2 days, then restart. Repeat BMET on Thursday.

## 2016-08-01 NOTE — Telephone Encounter (Signed)
Notes Recorded by Scarlette Calico, RN on 08/01/2016 at 3:51 PM EDT Spoke w/pt, she is aware, agreeable and verbalizes understanding. Repeat labs sch for [facility] 3/15

## 2016-08-02 ENCOUNTER — Ambulatory Visit (HOSPITAL_COMMUNITY)
Admission: RE | Admit: 2016-08-02 | Discharge: 2016-08-02 | Disposition: A | Discharge status: Home / Self Care | Payer: Medicaid Other | Source: Ambulatory Visit | Attending: Thoracic Surgery (Cardiothoracic Vascular Surgery) | Admitting: Thoracic Surgery (Cardiothoracic Vascular Surgery)

## 2016-08-02 ENCOUNTER — Ambulatory Visit (HOSPITAL_COMMUNITY): Admission: RE | Admit: 2016-08-02 | Payer: Medicaid Other | Source: Ambulatory Visit

## 2016-08-02 ENCOUNTER — Encounter (HOSPITAL_COMMUNITY): Payer: Self-pay

## 2016-08-02 DIAGNOSIS — I7409 Other arterial embolism and thrombosis of abdominal aorta: Secondary | ICD-10-CM

## 2016-08-02 DIAGNOSIS — I351 Nonrheumatic aortic (valve) insufficiency: Secondary | ICD-10-CM

## 2016-08-03 ENCOUNTER — Other Ambulatory Visit: Payer: Self-pay | Admitting: *Deleted

## 2016-08-03 MED ORDER — ROSUVASTATIN CALCIUM 20 MG PO TABS: 20.0000 mg | ORAL_TABLET | Freq: Every day | ORAL | 3 refills | Status: DC

## 2016-08-04 ENCOUNTER — Other Ambulatory Visit (HOSPITAL_COMMUNITY): Payer: Medicaid Other

## 2016-08-05 ENCOUNTER — Ambulatory Visit (HOSPITAL_COMMUNITY)
Admission: RE | Admit: 2016-08-05 | Discharge: 2016-08-05 | Disposition: A | Discharge status: Home / Self Care | Payer: Medicaid Other | Source: Ambulatory Visit | Attending: Cardiology | Admitting: Cardiology

## 2016-08-05 DIAGNOSIS — I5022 Chronic systolic (congestive) heart failure: Secondary | ICD-10-CM

## 2016-08-05 LAB — BASIC METABOLIC PANEL
ANION GAP: 9 (ref 5–15)
BUN: 39 mg/dL — AB (ref 6–20)
CALCIUM: 9.3 mg/dL (ref 8.9–10.3)
CO2: 24 mmol/L (ref 22–32)
CREATININE: 1.74 mg/dL — AB (ref 0.44–1.00)
Chloride: 104 mmol/L (ref 101–111)
GFR calc Af Amer: 37 mL/min — ABNORMAL LOW (ref >60)
GFR, EST NON AFRICAN AMERICAN: 32 mL/min — AB (ref >60)
GLUCOSE: 135 mg/dL — AB (ref 65–99)
Potassium: 4.6 mmol/L (ref 3.5–5.1)
Sodium: 137 mmol/L (ref 135–145)

## 2016-08-11 ENCOUNTER — Ambulatory Visit (HOSPITAL_COMMUNITY): Payer: Medicaid Other | Attending: Cardiology

## 2016-08-11 DIAGNOSIS — R9439 Abnormal result of other cardiovascular function study: Secondary | ICD-10-CM | POA: Insufficient documentation

## 2016-08-11 DIAGNOSIS — J984 Other disorders of lung: Secondary | ICD-10-CM | POA: Insufficient documentation

## 2016-08-11 DIAGNOSIS — I5042 Chronic combined systolic (congestive) and diastolic (congestive) heart failure: Secondary | ICD-10-CM

## 2016-08-15 ENCOUNTER — Other Ambulatory Visit: Payer: Self-pay | Admitting: *Deleted

## 2016-08-15 ENCOUNTER — Ambulatory Visit: Payer: Medicaid Other | Admitting: Thoracic Surgery (Cardiothoracic Vascular Surgery)

## 2016-08-15 DIAGNOSIS — I062 Rheumatic aortic stenosis with insufficiency: Secondary | ICD-10-CM

## 2016-08-17 ENCOUNTER — Encounter (HOSPITAL_COMMUNITY): Payer: Medicaid Other

## 2016-08-19 ENCOUNTER — Telehealth (HOSPITAL_COMMUNITY): Payer: Self-pay | Admitting: *Deleted

## 2016-08-19 ENCOUNTER — Encounter (HOSPITAL_COMMUNITY): Payer: Self-pay

## 2016-08-19 ENCOUNTER — Other Ambulatory Visit (HOSPITAL_COMMUNITY): Payer: Self-pay

## 2016-08-19 ENCOUNTER — Ambulatory Visit (HOSPITAL_COMMUNITY)
Admission: RE | Admit: 2016-08-19 | Discharge: 2016-08-19 | Disposition: A | Discharge status: Home / Self Care | Payer: Medicaid Other | Source: Ambulatory Visit | Attending: Cardiology | Admitting: Cardiology

## 2016-08-19 VITALS — BP 93/61 | HR 59 | Wt 183.0 lb

## 2016-08-19 DIAGNOSIS — I5022 Chronic systolic (congestive) heart failure: Secondary | ICD-10-CM | POA: Insufficient documentation

## 2016-08-19 DIAGNOSIS — R0600 Dyspnea, unspecified: Secondary | ICD-10-CM

## 2016-08-19 DIAGNOSIS — I110 Hypertensive heart disease with heart failure: Secondary | ICD-10-CM | POA: Insufficient documentation

## 2016-08-19 DIAGNOSIS — Z823 Family history of stroke: Secondary | ICD-10-CM | POA: Insufficient documentation

## 2016-08-19 DIAGNOSIS — Z8249 Family history of ischemic heart disease and other diseases of the circulatory system: Secondary | ICD-10-CM | POA: Diagnosis not present

## 2016-08-19 DIAGNOSIS — I099 Rheumatic heart disease, unspecified: Secondary | ICD-10-CM | POA: Diagnosis not present

## 2016-08-19 DIAGNOSIS — Z87891 Personal history of nicotine dependence: Secondary | ICD-10-CM | POA: Insufficient documentation

## 2016-08-19 DIAGNOSIS — I429 Cardiomyopathy, unspecified: Secondary | ICD-10-CM | POA: Diagnosis not present

## 2016-08-19 DIAGNOSIS — I5042 Chronic combined systolic (congestive) and diastolic (congestive) heart failure: Secondary | ICD-10-CM

## 2016-08-19 DIAGNOSIS — E039 Hypothyroidism, unspecified: Secondary | ICD-10-CM | POA: Insufficient documentation

## 2016-08-19 DIAGNOSIS — E785 Hyperlipidemia, unspecified: Secondary | ICD-10-CM | POA: Diagnosis not present

## 2016-08-19 DIAGNOSIS — N179 Acute kidney failure, unspecified: Secondary | ICD-10-CM

## 2016-08-19 DIAGNOSIS — Z7982 Long term (current) use of aspirin: Secondary | ICD-10-CM | POA: Insufficient documentation

## 2016-08-19 DIAGNOSIS — I050 Rheumatic mitral stenosis: Secondary | ICD-10-CM | POA: Insufficient documentation

## 2016-08-19 LAB — BASIC METABOLIC PANEL
Anion gap: 9 (ref 5–15)
BUN: 36 mg/dL — AB (ref 6–20)
CALCIUM: 8.9 mg/dL (ref 8.9–10.3)
CO2: 22 mmol/L (ref 22–32)
CREATININE: 1.58 mg/dL — AB (ref 0.44–1.00)
Chloride: 102 mmol/L (ref 101–111)
GFR calc Af Amer: 41 mL/min — ABNORMAL LOW (ref >60)
GFR, EST NON AFRICAN AMERICAN: 36 mL/min — AB (ref >60)
GLUCOSE: 124 mg/dL — AB (ref 65–99)
Potassium: 5.5 mmol/L — ABNORMAL HIGH (ref 3.5–5.1)
SODIUM: 133 mmol/L — AB (ref 135–145)

## 2016-08-19 LAB — DIGOXIN LEVEL: Digoxin Level: 2.2 ng/mL — ABNORMAL HIGH (ref 0.8–2.0)

## 2016-08-19 LAB — BRAIN NATRIURETIC PEPTIDE: B NATRIURETIC PEPTIDE 5: 224.3 pg/mL — AB (ref 0.0–100.0)

## 2016-08-19 NOTE — Patient Instructions (Addendum)
Routine lab work today. Will notify you of abnormal results, otherwise no news is good news!  You have been scheduled for a heart catheterization. Please see instruction sheet for additional details.  Follow up with Dr. Aundra Dubin in 1 month.  Do the following things EVERYDAY: 1) Weigh yourself in the morning before breakfast. Write it down and keep it in a log. 2) Take your medicines as prescribed 3) Eat low salt foods-Limit salt (sodium) to 2000 mg per day.  4) Stay as active as you can everyday 5) Limit all fluids for the day to less than 2 liters

## 2016-08-19 NOTE — Telephone Encounter (Signed)
Basic metabolic panel  Order: [id number]  Status:  Final result Visible to patient:  No (Not Released) Dx:  Chronic combined systolic and diastol...  Notes recorded by Kennieth Rad, RN on 08/19/2016 at 1:18 PM EDT Patient called back and she is agreeable with plan. Lab appt made, BMET and digoxin level ordered, and medication list updated. ------  Notes recorded by Kennieth Rad, RN on 08/19/2016 at 12:58 PM EDT Called and left message to call us back as soon as possible regarding lab results. ------  Notes recorded by Larey Dresser, MD on 08/19/2016 at 12:35 PM EDT Call now please. Stop digoxin, level is markedly elevated. Stop any supplemental K and stop spironolactone. Repeat BMET next week.

## 2016-08-19 NOTE — Progress Notes (Signed)
PCP: Roe Coombs Cardiology: Dr. Ellyn Hack HF Cardiology: Dr. Aundra Dubin  56 yo with history of rheumatic heart disease s/p aortic and mitral valve repairs in 2010 has developed CHF and presents for CHF clinic evaluation.  She had aortic and mitral repairs in 2010.  Afterwards, EF was mildly low in the 40-45% range.  However, in the last year, EF has dropped.  EF 25-30% on 1/18 echo.  TEE showed severe MR, moderate AI, moderate aortic stenosis.  RHC/LHC in 2/18 showed on significantly elevated LV and RV filling pressures.    When I saw her at initial appointment, she was very dyspneic with exertion, with orthopnea and PND.  I adjusted her meds and increased Lasix.  She has seen Dr. Roxy Manns since that time for evaluation for redo valve surgery.  She would require high risk mitral and aortic valve replacements.   I had to cut back on her Entresto with rise in creatinine to 2.43, it has improved.  She has been feeling better overall.  Weight is down 2 more lbs.  No dyspnea walking on flat ground, able to get to her mailbox now without problems.  Can do her housework without dyspnea.  No orthopnea/PND. No lightheadedness.  CPX was done in 3/18 showing moderate to severely decreased functional capacity.   Labs (2/18): K 3.8 => 3.4, creatinine 1.09 => 1.07, hgb 12.7, BNP 1010 Labs (3/18): K 4.6, creatinine 2.43 => 1.74  PMH: 1. HTN 2. Hypothyroidism 3. Hyperlipidemia 4. Rheumatic heart disease: s/p MV repair and aortic valve repair in 2010, Dr. Roxy Manns. - TEE (2/18): EF 35-30%, dilated LV, rheumatic-appearing aortic valve s/p repair with moderate AS (AVA 1.1 cm^2), moderate AI.  MV s/p repair with mild-moderate mitral stenosis and eccentric severe MR.   5. Chronic systolic CHF: Nonischemic cardiomyopathy, probably related to valvular disease.   - Echo (11/15): EF 40-45%, - Echo (12/16): EF 35-40%. - Echo (1/18): EF 25-30%.  - LHC/RHC (2/18): Normal coronaries, mean RA 18, mean PA 74/39 mean 52, mean PCWP 31,  CI 1.98 Fick. - CPX (3/18): peak VO2 11.7, VE/VCO2 slope 40, RER 1.11 => moderate to severely decreased functional capacity due to HF.   Social History   Social History  . Marital status: Single    Spouse name: N/A  . Number of children: N/A  . Years of education: N/A   Occupational History  . Not on file.   Social History Main Topics  . Smoking status: Former Research scientist (life sciences)  . Smokeless tobacco: Never Used     Comment: quit 2011  . Alcohol use No  . Drug use: No  . Sexual activity: Not Currently   Other Topics Concern  . Not on file   Social History Narrative   Unemployed, on disability.   Now is finally reestablished with a PCP.   She is a former smoker, quit in 2011. Denies alcohol consumption   Tries to walk routinely.   Family History  Problem Relation Age of Onset  . Heart disease Mother   . CVA Mother    ROS: All systems reviewed and negative except as per HPI.   Current Outpatient Prescriptions  Medication Sig Dispense Refill  . albuterol (PROVENTIL HFA;VENTOLIN HFA) 108 (90 BASE) MCG/ACT inhaler Inhale 2 puffs into the lungs every 6 (six) hours as needed for shortness of breath.     [name] aspirin 81 MG chewable tablet Chew 81 mg by mouth daily.     . beclomethasone (QVAR) 40 MCG/ACT inhaler Inhale 2 puffs  into the lungs daily.     . cycloSPORINE (RESTASIS) 0.05 % ophthalmic emulsion Place 1 drop into both eyes 2 (two) times daily.    . furosemide (LASIX) 40 MG tablet Take 80 mg by mouth daily.    [name] gabapentin (NEURONTIN) 100 MG capsule Take 100 mg by mouth daily.   0  . hydrocortisone 2.5 % cream Apply 1 application topically daily as needed (itching).     [name] levothyroxine (SYNTHROID, LEVOTHROID) 112 MCG tablet Take 112 mcg by mouth daily.    . rosuvastatin (CRESTOR) 20 MG tablet Take 1 tablet (20 mg total) by mouth daily. 90 tablet 3  . sacubitril-valsartan (ENTRESTO) 24-26 MG Take 1 tablet by mouth 2 (two) times daily. 60 tablet 3  . metolazone (ZAROXOLYN) 2.5 MG  tablet Take 1 tablet (2.5 mg total) by mouth daily as needed. (Patient not taking: Reported on 08/19/2016) 15 tablet 11   No current facility-administered medications for this encounter.    BP 93/61   Pulse (!) 59   Wt 183 lb (83 kg)   LMP 03/06/2012   SpO2 100%   BMI 39.60 kg/m  General: NAD Neck: Thick, no JVD, no thyromegaly or thyroid nodule.  Lungs: Clear to auscultation bilaterally with normal respiratory effort. CV: Nondisplaced PMI.  Heart regular S1/S2, no S3/S4, 2/6 HSM apex, 3/6 SEM RUSB.  No edema.  No carotid bruit.  Normal pedal pulses.  Abdomen: Soft, nontender, no hepatosplenomegaly, mild distention.  Skin: Intact without lesions or rashes.  Neurologic: Alert and oriented x 3.  Psych: Normal affect. Extremities: No clubbing or cyanosis.  HEENT: Normal.   Assessment/Plan: 1. 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  She had aortic and mitral valve repairs in 2010.  Now the aortic valve is moderate stenotic and moderately regurgitant, and there is severe eccentric MR with mild to moderate mitral stenosis.  RHC showed elevated filling pressures.  I do not think that she would be a very good Mitraclip candidate as she already has a degree of mitral stenosis.  She has seen Dr. Roxy Manns.  To correct the situation, she would need aortic and mitral valve replacements.  This would be very high risk.  However, ongoing medical management will be very high risk for her in the long-term. If she cannot have valve surgery, LVAD as bridge to transplant would be a potential option. For now, will try to optimize her hemodynamically and then reassess situation with Dr. Roxy Manns.  - Symptomatically improved.  I will repeat RHC in about 10 days to reassess hemodynamics.  I discussed risks/benefits of the procedure with her and she agrees to it.  2. Chronic systolic CHF: EF 33-54%.  Nonischemic cardiomyopathy (no significant disease on 2/18 LHC).  It is possible that this cardiomyopathy is driven by her  valve disease.  NYHA class II symptoms (improved).  Volume status looks better with higher Lasix. I had to cut back on her Entresto due to low BP and AKI.  CPX was done, showing moderate to severely decreased functional capacity.  - Continue Lasix 80 qd.  BMET/BNP today.  - Continue Entresto 24/26 bid.   - Continue spironolactone.  - She is on digoxin, check level.   3. AKI: In setting of hypotension diuresis, creatinine up to 2.43 recently.  Last creatinine was down to 1.74. BMET today.   She will need to followup in 4 weeks.   Loralie Champagne 08/19/2016

## 2016-08-23 ENCOUNTER — Encounter (HOSPITAL_COMMUNITY): Payer: Self-pay

## 2016-08-23 ENCOUNTER — Ambulatory Visit (HOSPITAL_COMMUNITY)
Admission: RE | Admit: 2016-08-23 | Discharge: 2016-08-23 | Disposition: A | Discharge status: Home / Self Care | Payer: Medicaid Other | Source: Ambulatory Visit | Attending: Thoracic Surgery (Cardiothoracic Vascular Surgery) | Admitting: Thoracic Surgery (Cardiothoracic Vascular Surgery)

## 2016-08-23 DIAGNOSIS — I062 Rheumatic aortic stenosis with insufficiency: Secondary | ICD-10-CM

## 2016-08-23 DIAGNOSIS — I7789 Other specified disorders of arteries and arterioles: Secondary | ICD-10-CM | POA: Insufficient documentation

## 2016-08-23 MED ORDER — IOPAMIDOL (ISOVUE-370) INJECTION 76%
INTRAVENOUS | Status: AC
  Administered 2016-08-23: 80 mL
  Filled 2016-08-23: qty 100

## 2016-08-26 ENCOUNTER — Ambulatory Visit (HOSPITAL_COMMUNITY)
Admission: RE | Admit: 2016-08-26 | Discharge: 2016-08-26 | Disposition: A | Discharge status: Home / Self Care | Payer: Medicaid Other | Source: Ambulatory Visit | Attending: Internal Medicine | Admitting: Internal Medicine

## 2016-08-26 DIAGNOSIS — I5042 Chronic combined systolic (congestive) and diastolic (congestive) heart failure: Secondary | ICD-10-CM | POA: Diagnosis not present

## 2016-08-26 LAB — BASIC METABOLIC PANEL
ANION GAP: 9 (ref 5–15)
BUN: 29 mg/dL — ABNORMAL HIGH (ref 6–20)
CALCIUM: 8.5 mg/dL — AB (ref 8.9–10.3)
CHLORIDE: 101 mmol/L (ref 101–111)
CO2: 28 mmol/L (ref 22–32)
CREATININE: 1.55 mg/dL — AB (ref 0.44–1.00)
GFR calc non Af Amer: 37 mL/min — ABNORMAL LOW (ref >60)
GFR, EST AFRICAN AMERICAN: 42 mL/min — AB (ref >60)
Glucose, Bld: 142 mg/dL — ABNORMAL HIGH (ref 65–99)
Potassium: 3.3 mmol/L — ABNORMAL LOW (ref 3.5–5.1)
SODIUM: 138 mmol/L (ref 135–145)

## 2016-08-26 LAB — DIGOXIN LEVEL: Digoxin Level: <0.2 ng/mL — ABNORMAL LOW (ref 0.8–2.0)

## 2016-08-30 ENCOUNTER — Encounter (HOSPITAL_COMMUNITY)
Admission: RE | Disposition: A | Discharge status: Home / Self Care | Payer: Self-pay | Source: Ambulatory Visit | Attending: Cardiology

## 2016-08-30 ENCOUNTER — Telehealth (HOSPITAL_COMMUNITY): Payer: Self-pay | Admitting: *Deleted

## 2016-08-30 ENCOUNTER — Ambulatory Visit (HOSPITAL_COMMUNITY)
Admission: RE | Admit: 2016-08-30 | Discharge: 2016-08-30 | Disposition: A | Discharge status: Home / Self Care | Payer: Medicaid Other | Source: Ambulatory Visit | Attending: Cardiology | Admitting: Cardiology

## 2016-08-30 ENCOUNTER — Encounter (HOSPITAL_COMMUNITY): Payer: Self-pay | Admitting: Cardiology

## 2016-08-30 DIAGNOSIS — Z952 Presence of prosthetic heart valve: Secondary | ICD-10-CM | POA: Insufficient documentation

## 2016-08-30 DIAGNOSIS — E785 Hyperlipidemia, unspecified: Secondary | ICD-10-CM | POA: Insufficient documentation

## 2016-08-30 DIAGNOSIS — R0600 Dyspnea, unspecified: Secondary | ICD-10-CM

## 2016-08-30 DIAGNOSIS — I5022 Chronic systolic (congestive) heart failure: Secondary | ICD-10-CM | POA: Insufficient documentation

## 2016-08-30 DIAGNOSIS — N179 Acute kidney failure, unspecified: Secondary | ICD-10-CM | POA: Diagnosis not present

## 2016-08-30 DIAGNOSIS — I5042 Chronic combined systolic (congestive) and diastolic (congestive) heart failure: Secondary | ICD-10-CM

## 2016-08-30 DIAGNOSIS — I959 Hypotension, unspecified: Secondary | ICD-10-CM | POA: Diagnosis not present

## 2016-08-30 DIAGNOSIS — Z8249 Family history of ischemic heart disease and other diseases of the circulatory system: Secondary | ICD-10-CM | POA: Diagnosis not present

## 2016-08-30 DIAGNOSIS — Z823 Family history of stroke: Secondary | ICD-10-CM | POA: Insufficient documentation

## 2016-08-30 DIAGNOSIS — I110 Hypertensive heart disease with heart failure: Secondary | ICD-10-CM | POA: Diagnosis not present

## 2016-08-30 DIAGNOSIS — Z7982 Long term (current) use of aspirin: Secondary | ICD-10-CM | POA: Diagnosis not present

## 2016-08-30 DIAGNOSIS — I080 Rheumatic disorders of both mitral and aortic valves: Secondary | ICD-10-CM | POA: Diagnosis not present

## 2016-08-30 DIAGNOSIS — E039 Hypothyroidism, unspecified: Secondary | ICD-10-CM | POA: Diagnosis not present

## 2016-08-30 DIAGNOSIS — I428 Other cardiomyopathies: Secondary | ICD-10-CM | POA: Diagnosis not present

## 2016-08-30 DIAGNOSIS — I509 Heart failure, unspecified: Secondary | ICD-10-CM | POA: Diagnosis not present

## 2016-08-30 DIAGNOSIS — I2721 Secondary pulmonary arterial hypertension: Secondary | ICD-10-CM | POA: Insufficient documentation

## 2016-08-30 DIAGNOSIS — Z87891 Personal history of nicotine dependence: Secondary | ICD-10-CM | POA: Insufficient documentation

## 2016-08-30 HISTORY — PX: RIGHT HEART CATH: CATH118263

## 2016-08-30 LAB — POCT I-STAT 3, VENOUS BLOOD GAS (G3P V)
ACID-BASE EXCESS: 2 mmol/L (ref 0.0–2.0)
Acid-Base Excess: 2 mmol/L (ref 0.0–2.0)
Acid-Base Excess: 3 mmol/L — ABNORMAL HIGH (ref 0.0–2.0)
BICARBONATE: 27.8 mmol/L (ref 20.0–28.0)
BICARBONATE: 27.8 mmol/L (ref 20.0–28.0)
BICARBONATE: 28.7 mmol/L — AB (ref 20.0–28.0)
O2 Saturation: 51 %
O2 Saturation: 52 %
O2 Saturation: 53 %
PCO2 VEN: 46.7 mmHg (ref 44.0–60.0)
PCO2 VEN: 48 mmHg (ref 44.0–60.0)
PH VEN: 7.391 (ref 7.250–7.430)
PO2 VEN: 28 mmHg — AB (ref 32.0–45.0)
PO2 VEN: 29 mmHg — AB (ref 32.0–45.0)
TCO2: 29 mmol/L (ref 0–100)
TCO2: 29 mmol/L (ref 0–100)
TCO2: 30 mmol/L (ref 0–100)
pCO2, Ven: 45.8 mmHg (ref 44.0–60.0)
pH, Ven: 7.382 (ref 7.250–7.430)
pH, Ven: 7.385 (ref 7.250–7.430)
pO2, Ven: 28 mmHg — CL (ref 32.0–45.0)

## 2016-08-30 LAB — BASIC METABOLIC PANEL
Anion gap: 11 (ref 5–15)
BUN: 24 mg/dL — AB (ref 6–20)
CHLORIDE: 100 mmol/L — AB (ref 101–111)
CO2: 29 mmol/L (ref 22–32)
CREATININE: 1.38 mg/dL — AB (ref 0.44–1.00)
Calcium: 8.5 mg/dL — ABNORMAL LOW (ref 8.9–10.3)
GFR calc non Af Amer: 42 mL/min — ABNORMAL LOW (ref >60)
GFR, EST AFRICAN AMERICAN: 49 mL/min — AB (ref >60)
Glucose, Bld: 122 mg/dL — ABNORMAL HIGH (ref 65–99)
Potassium: 3.2 mmol/L — ABNORMAL LOW (ref 3.5–5.1)
Sodium: 140 mmol/L (ref 135–145)

## 2016-08-30 LAB — CBC
HCT: 37.8 % (ref 36.0–46.0)
Hemoglobin: 12.3 g/dL (ref 12.0–15.0)
MCH: 27.5 pg (ref 26.0–34.0)
MCHC: 32.5 g/dL (ref 30.0–36.0)
MCV: 84.6 fL (ref 78.0–100.0)
PLATELETS: 144 10*3/uL — AB (ref 150–400)
RBC: 4.47 MIL/uL (ref 3.87–5.11)
RDW: 15 % (ref 11.5–15.5)
WBC: 5.5 10*3/uL (ref 4.0–10.5)

## 2016-08-30 LAB — PROTIME-INR
INR: 1
PROTHROMBIN TIME: 13.2 s (ref 11.4–15.2)

## 2016-08-30 SURGERY — RIGHT HEART CATH
Anesthesia: LOCAL

## 2016-08-30 MED ORDER — ONDANSETRON HCL 4 MG/2ML IJ SOLN: 4.0000 mg | Freq: Four times a day (QID) | INTRAMUSCULAR | Status: DC | PRN

## 2016-08-30 MED ORDER — ASPIRIN 81 MG PO CHEW: 81.0000 mg | CHEWABLE_TABLET | ORAL | Status: DC

## 2016-08-30 MED ORDER — ACETAMINOPHEN 325 MG PO TABS: 650.0000 mg | ORAL_TABLET | ORAL | Status: DC | PRN

## 2016-08-30 MED ORDER — SODIUM CHLORIDE 0.9% FLUSH: 3.0000 mL | INTRAVENOUS | Status: DC | PRN

## 2016-08-30 MED ORDER — POTASSIUM CHLORIDE CRYS ER 20 MEQ PO TBCR
EXTENDED_RELEASE_TABLET | ORAL | Status: AC
  Filled 2016-08-30: qty 2

## 2016-08-30 MED ORDER — LIDOCAINE HCL (PF) 1 % IJ SOLN
INTRAMUSCULAR | Status: DC | PRN
  Administered 2016-08-30: 2 mL

## 2016-08-30 MED ORDER — LIDOCAINE HCL (PF) 1 % IJ SOLN
INTRAMUSCULAR | Status: AC
  Filled 2016-08-30: qty 30

## 2016-08-30 MED ORDER — SODIUM CHLORIDE 0.9 % IV SOLN
INTRAVENOUS | Status: DC
  Administered 2016-08-30: 08:00:00 via INTRAVENOUS

## 2016-08-30 MED ORDER — HEPARIN (PORCINE) IN NACL 2-0.9 UNIT/ML-% IJ SOLN
INTRAMUSCULAR | Status: AC
  Filled 2016-08-30: qty 1000

## 2016-08-30 MED ORDER — POTASSIUM CHLORIDE CRYS ER 20 MEQ PO TBCR: 40.0000 meq | EXTENDED_RELEASE_TABLET | Freq: Once | ORAL | Status: DC

## 2016-08-30 MED ORDER — SODIUM CHLORIDE 0.9 % IV SOLN: 250.0000 mL | INTRAVENOUS | Status: DC | PRN

## 2016-08-30 MED ORDER — HEPARIN (PORCINE) IN NACL 2-0.9 UNIT/ML-% IJ SOLN
INTRAMUSCULAR | Status: DC | PRN
  Administered 2016-08-30: 1000 mL

## 2016-08-30 MED ORDER — SODIUM CHLORIDE 0.9% FLUSH: 3.0000 mL | Freq: Two times a day (BID) | INTRAVENOUS | Status: DC

## 2016-08-30 SURGICAL SUPPLY — 10 items
CATH BALLN WEDGE 5F 110CM (CATHETERS) ×2 IMPLANT
KIT HEART RIGHT NAMIC (KITS) ×1 IMPLANT
PACK CARDIAC CATHETERIZATION (CUSTOM PROCEDURE TRAY) ×2 IMPLANT
PROTECTION STATION PRESSURIZED (MISCELLANEOUS) ×2
SHEATH GLIDE SLENDER 4/5FR (SHEATH) ×2 IMPLANT
STATION PROTECTION PRESSURIZED (MISCELLANEOUS) IMPLANT
TRANSDUCER W/STOPCOCK (MISCELLANEOUS) ×2 IMPLANT
TUBING ART PRESS 72  MALE/FEM (TUBING) ×1
TUBING ART PRESS 72 MALE/FEM (TUBING) IMPLANT
WIRE EMERALD 3MM-J .025X260CM (WIRE) ×2 IMPLANT

## 2016-08-30 NOTE — H&P (View-Only) (Signed)
PCP: [facility] Coombs Cardiology: Dr. Ellyn Hack HF Cardiology: Dr. Aundra Dubin  56 yo with history of rheumatic heart disease s/p aortic and mitral valve repairs in 2010 has developed CHF and presents for CHF clinic evaluation.  She had aortic and mitral repairs in 2010.  Afterwards, EF was mildly low in the 40-45% range.  However, in the last year, EF has dropped.  EF 25-30% on 1/18 echo.  TEE showed severe MR, moderate AI, moderate aortic stenosis.  RHC/LHC in 2/18 showed on significantly elevated LV and RV filling pressures.    When I saw her at initial appointment, she was very dyspneic with exertion, with orthopnea and PND.  I adjusted her meds and increased Lasix.  She has seen Dr. Roxy Manns since that time for evaluation for redo valve surgery.  She would require high risk mitral and aortic valve replacements.   I had to cut back on her Entresto with rise in creatinine to 2.43, it has improved.  She has been feeling better overall.  Weight is down 2 more lbs.  No dyspnea walking on flat ground, able to get to her mailbox now without problems.  Can do her housework without dyspnea.  No orthopnea/PND. No lightheadedness.  CPX was done in 3/18 showing moderate to severely decreased functional capacity.   Labs (2/18): K 3.8 => 3.4, creatinine 1.09 => 1.07, hgb 12.7, BNP 1010 Labs (3/18): K 4.6, creatinine 2.43 => 1.74  PMH: 1. HTN 2. Hypothyroidism 3. Hyperlipidemia 4. Rheumatic heart disease: s/p MV repair and aortic valve repair in 2010, Dr. Roxy Manns. - TEE (2/18): EF 35-30%, dilated LV, rheumatic-appearing aortic valve s/p repair with moderate AS (AVA 1.1 cm^2), moderate AI.  MV s/p repair with mild-moderate mitral stenosis and eccentric severe MR.   5. Chronic systolic CHF: Nonischemic cardiomyopathy, probably related to valvular disease.   - Echo (11/15): EF 40-45%, - Echo (12/16): EF 35-40%. - Echo (1/18): EF 25-30%.  - LHC/RHC (2/18): Normal coronaries, mean RA 18, mean PA 74/39 mean 52, mean PCWP 31,  CI 1.98 Fick. - CPX (3/18): peak VO2 11.7, VE/VCO2 slope 40, RER 1.11 => moderate to severely decreased functional capacity due to HF.   Social History   Social History  . Marital status: Single    Spouse name: N/A  . Number of children: N/A  . Years of education: N/A   Occupational History  . Not on file.   Social History Main Topics  . Smoking status: Former Research scientist (life sciences)  . Smokeless tobacco: Never Used     Comment: quit 2011  . Alcohol use No  . Drug use: No  . Sexual activity: Not Currently   Other Topics Concern  . Not on file   Social History Narrative   Unemployed, on disability.   Now is finally reestablished with a PCP.   She is a former smoker, quit in 2011. Denies alcohol consumption   Tries to walk routinely.   Family History  Problem Relation Age of Onset  . Heart disease Mother   . CVA Mother    ROS: All systems reviewed and negative except as per HPI.   Current Outpatient Prescriptions  Medication Sig Dispense Refill  . albuterol (PROVENTIL HFA;VENTOLIN HFA) 108 (90 BASE) MCG/ACT inhaler Inhale 2 puffs into the lungs every 6 (six) hours as needed for shortness of breath.     [name] aspirin 81 MG chewable tablet Chew 81 mg by mouth daily.     . beclomethasone (QVAR) 40 MCG/ACT inhaler Inhale 2 puffs  into the lungs daily.     . cycloSPORINE (RESTASIS) 0.05 % ophthalmic emulsion Place 1 drop into both eyes 2 (two) times daily.    . furosemide (LASIX) 40 MG tablet Take 80 mg by mouth daily.    [name] gabapentin (NEURONTIN) 100 MG capsule Take 100 mg by mouth daily.   0  . hydrocortisone 2.5 % cream Apply 1 application topically daily as needed (itching).     [name] levothyroxine (SYNTHROID, LEVOTHROID) 112 MCG tablet Take 112 mcg by mouth daily.    . rosuvastatin (CRESTOR) 20 MG tablet Take 1 tablet (20 mg total) by mouth daily. 90 tablet 3  . sacubitril-valsartan (ENTRESTO) 24-26 MG Take 1 tablet by mouth 2 (two) times daily. 60 tablet 3  . metolazone (ZAROXOLYN) 2.5 MG  tablet Take 1 tablet (2.5 mg total) by mouth daily as needed. (Patient not taking: Reported on 08/19/2016) 15 tablet 11   No current facility-administered medications for this encounter.    BP 93/61   Pulse (!) 59   Wt 183 lb (83 kg)   LMP 03/06/2012   SpO2 100%   BMI 39.60 kg/m  General: NAD Neck: Thick, no JVD, no thyromegaly or thyroid nodule.  Lungs: Clear to auscultation bilaterally with normal respiratory effort. CV: Nondisplaced PMI.  Heart regular S1/S2, no S3/S4, 2/6 HSM apex, 3/6 SEM RUSB.  No edema.  No carotid bruit.  Normal pedal pulses.  Abdomen: Soft, nontender, no hepatosplenomegaly, mild distention.  Skin: Intact without lesions or rashes.  Neurologic: Alert and oriented x 3.  Psych: Normal affect. Extremities: No clubbing or cyanosis.  HEENT: Normal.   Assessment/Plan: 1. 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  She had aortic and mitral valve repairs in 2010.  Now the aortic valve is moderate stenotic and moderately regurgitant, and there is severe eccentric MR with mild to moderate mitral stenosis.  RHC showed elevated filling pressures.  I do not think that she would be a very good Mitraclip candidate as she already has a degree of mitral stenosis.  She has seen Dr. Roxy Manns.  To correct the situation, she would need aortic and mitral valve replacements.  This would be very high risk.  However, ongoing medical management will be very high risk for her in the long-term. If she cannot have valve surgery, LVAD as bridge to transplant would be a potential option. For now, will try to optimize her hemodynamically and then reassess situation with Dr. Roxy Manns.  - Symptomatically improved.  I will repeat RHC in about 10 days to reassess hemodynamics.  I discussed risks/benefits of the procedure with her and she agrees to it.  2. Chronic systolic CHF: EF 22-63%.  Nonischemic cardiomyopathy (no significant disease on 2/18 LHC).  It is possible that this cardiomyopathy is driven by her  valve disease.  NYHA class II symptoms (improved).  Volume status looks better with higher Lasix. I had to cut back on her Entresto due to low BP and AKI.  CPX was done, showing moderate to severely decreased functional capacity.  - Continue Lasix 80 qd.  BMET/BNP today.  - Continue Entresto 24/26 bid.   - Continue spironolactone.  - She is on digoxin, check level.   3. AKI: In setting of hypotension diuresis, creatinine up to 2.43 recently.  Last creatinine was down to 1.74. BMET today.   She will need to followup in 4 weeks.   Loralie Champagne 08/19/2016

## 2016-08-30 NOTE — Progress Notes (Signed)
Pt continues to deny dizziness, nausea or worsening shortness of breath. Pt reports that blood pressure "is always up and down". Pt reports feeling "normal". Pt ambulated to bathroom and voided without difficulty. Pt ate and drank without difficulty.

## 2016-08-30 NOTE — Discharge Instructions (Signed)
°  This sheet gives you information about how to care for yourself after your procedure. Your health care provider may also give you more specific instructions. If you have problems or questions, contact your health care provider. What can I expect after the procedure? After the procedure, it is common to have:  Bruising or mild discomfort in the area where the IV was inserted (insertion site). Follow these instructions at home: Eating and drinking   Follow instructions from your health care provider about eating or drinking restrictions.  Drink a lot of fluids for the first several days after the procedure, as directed by your health care provider. This helps to wash (flush) the contrast out of your body. Examples of healthy fluids include water or low-calorie drinks. General instructions   Check your IV insertion area every day for signs of infection. Check for:  Redness, swelling, or pain.  Fluid or blood.  Warmth.  Pus or a bad smell.  Take over-the-counter and prescription medicines only as told by your health care provider.  Rest and return to your normal activities as told by your health care provider. Ask your health care provider what activities are safe for you.  Do not drive for 24 hours if you were given a medicine to help you relax (sedative), or until your health care provider approves.  Keep all follow-up visits as told by your health care provider. This is important. Contact a health care provider if:  Your skin becomes itchy or you develop a rash or hives.  You have a fever that does not get better with medicine.  You feel nauseous.  You vomit.  You have redness, swelling, or pain around the insertion site.  You have fluid or blood coming from the insertion site.  Your insertion area feels warm to the touch.  You have pus or a bad smell coming from the insertion site. Get help right away if:  You have difficulty breathing or shortness of breath.  You  develop chest pain.  You faint.  You feel very dizzy. These symptoms may represent a serious problem that is an emergency. Do not wait to see if the symptoms will go away. Get medical help right away. Call your local emergency services (911 in the [place] Do not drive yourself to the hospital. Summary  After your procedure, it is common to have bruising or mild discomfort in the area where the IV was inserted.  You should check your IV insertion area every day for signs of infection.  Take over-the-counter and prescription medicines only as told by your health care provider.  You should drink a lot of fluids for the first several days after the procedure to help flush the contrast from your body. This information is not intended to replace advice given to you by your health care provider. Make sure you discuss any questions you have with your health care provider. Document Released: 02/27/2013 Document Revised: 04/02/2016 Document Reviewed: 04/02/2016 Elsevier Interactive Patient Education  2017 [employer].

## 2016-08-30 NOTE — Telephone Encounter (Signed)
Dr. Aundra Dubin requested patient to come in for BMET and Dig level next week.  I called patient and she is agreeable to come in next Monday the 16th.  Appt made and labs ordered.

## 2016-08-30 NOTE — Interval H&P Note (Signed)
History and Physical Interval Note:  08/30/2016 9:24 AM  [name]  has presented today for surgery, with the diagnosis of chf  The various methods of treatment have been discussed with the patient and family. After consideration of risks, benefits and other options for treatment, the patient has consented to  Procedure(s): Right Heart Cath (N/A) as a surgical intervention .  The patient's history has been reviewed, patient examined, no change in status, stable for surgery.  I have reviewed the patient's chart and labs.  Questions were answered to the patient's satisfaction.     [name]

## 2016-08-31 ENCOUNTER — Telehealth (HOSPITAL_COMMUNITY): Payer: Self-pay | Admitting: *Deleted

## 2016-08-31 MED ORDER — DIGOXIN 125 MCG PO TABS: 0.0625 mg | ORAL_TABLET | ORAL | 3 refills | Status: DC

## 2016-08-31 MED ORDER — SACUBITRIL-VALSARTAN 24-26 MG PO TABS: 1.0000 | ORAL_TABLET | Freq: Two times a day (BID) | ORAL | 3 refills | Status: DC

## 2016-08-31 MED ORDER — SPIRONOLACTONE 25 MG PO TABS: 12.5000 mg | ORAL_TABLET | Freq: Every day | ORAL | 3 refills | Status: DC

## 2016-08-31 NOTE — Telephone Encounter (Signed)
Pt called very upset and confused about her medications.  She states several changes have been made recently including yesterday when she had procedure and she is unsure what she should be taking.  Per Dr Aundra Dubin pt should be on:  Carvedilol 12.5 mg BID Lasix 80 mg daily Entresto 24/26 mg BID Spironolactone 12.5 mg daily Digoxin 0.0625 mg QOD  Pt aware, agreeable and verbalizes understanding, she states she is out of [place].  Prescriptions sent to pharmacy.  Pt is sch for lab work on Mon 4/16, she will bring medications to that appt for review.

## 2016-09-01 ENCOUNTER — Ambulatory Visit: Payer: Medicaid Other | Admitting: Thoracic Surgery (Cardiothoracic Vascular Surgery)

## 2016-09-02 ENCOUNTER — Ambulatory Visit: Payer: Medicaid Other | Admitting: Thoracic Surgery (Cardiothoracic Vascular Surgery)

## 2016-09-05 ENCOUNTER — Ambulatory Visit (HOSPITAL_COMMUNITY)
Admission: RE | Admit: 2016-09-05 | Discharge: 2016-09-05 | Disposition: A | Discharge status: Home / Self Care | Payer: Medicaid Other | Source: Ambulatory Visit | Attending: Internal Medicine | Admitting: Internal Medicine

## 2016-09-05 DIAGNOSIS — I5042 Chronic combined systolic (congestive) and diastolic (congestive) heart failure: Secondary | ICD-10-CM | POA: Diagnosis present

## 2016-09-05 DIAGNOSIS — I5033 Acute on chronic diastolic (congestive) heart failure: Secondary | ICD-10-CM

## 2016-09-05 LAB — BASIC METABOLIC PANEL
Anion gap: 8 (ref 5–15)
BUN: 21 mg/dL — AB (ref 6–20)
CALCIUM: 8.7 mg/dL — AB (ref 8.9–10.3)
CO2: 29 mmol/L (ref 22–32)
Chloride: 101 mmol/L (ref 101–111)
Creatinine, Ser: 1.38 mg/dL — ABNORMAL HIGH (ref 0.44–1.00)
GFR calc Af Amer: 49 mL/min — ABNORMAL LOW (ref >60)
GFR, EST NON AFRICAN AMERICAN: 42 mL/min — AB (ref >60)
GLUCOSE: 111 mg/dL — AB (ref 65–99)
Potassium: 3 mmol/L — ABNORMAL LOW (ref 3.5–5.1)
Sodium: 138 mmol/L (ref 135–145)

## 2016-09-05 LAB — DIGOXIN LEVEL: Digoxin Level: 0.3 ng/mL — ABNORMAL LOW (ref 0.8–2.0)

## 2016-09-07 ENCOUNTER — Telehealth (HOSPITAL_COMMUNITY): Payer: Self-pay | Admitting: *Deleted

## 2016-09-07 DIAGNOSIS — I5022 Chronic systolic (congestive) heart failure: Secondary | ICD-10-CM

## 2016-09-07 MED ORDER — CARVEDILOL 12.5 MG PO TABS: 12.5000 mg | ORAL_TABLET | Freq: Two times a day (BID) | ORAL | 3 refills | Status: DC

## 2016-09-07 MED ORDER — POTASSIUM CHLORIDE ER 10 MEQ PO TBCR: 20.0000 meq | EXTENDED_RELEASE_TABLET | Freq: Every day | ORAL | 3 refills | Status: DC

## 2016-09-07 NOTE — Telephone Encounter (Signed)
Basic Metabolic Panel (BMET)  Order: [id number]  Status:  Final result Visible to patient:  No (Not Released) Dx:  Chronic combined systolic and diastol...  Notes recorded by Kennieth Rad, RN on 09/07/2016 at 2:15 PM EDT Patient called back and I told her that she needs to start taking 20 mEq of KCL Daily, she requested for me to send in the 10 mEq dose due to the size of pills. I have also scheduled her a lab appt for next Tuesday and placed BMET order. ------  Notes recorded by Scarlette Calico, RN on 09/07/2016 at 10:08 AM EDT Labs were drawn and resulted on 09/05/16, order was placed on 4/10. Attempted to call pt and Left message to call back ------  Notes recorded by Larey Dresser, MD on 09/06/2016 at 4:45 PM EDT Digoxin level ok, creatinine ok. Add 20 mEq KCl additional to her daily regimen, check BMET again in 1 week.    Ref Range & Units 8d ago (08/30/16) 8d ago (08/30/16) 12d

## 2016-09-13 ENCOUNTER — Other Ambulatory Visit (HOSPITAL_COMMUNITY): Payer: Medicaid Other

## 2016-09-16 ENCOUNTER — Other Ambulatory Visit (HOSPITAL_COMMUNITY): Payer: Medicaid Other

## 2016-09-19 ENCOUNTER — Encounter: Payer: Self-pay | Admitting: Thoracic Surgery (Cardiothoracic Vascular Surgery)

## 2016-09-19 ENCOUNTER — Ambulatory Visit (INDEPENDENT_AMBULATORY_CARE_PROVIDER_SITE_OTHER): Payer: Medicaid Other | Admitting: Thoracic Surgery (Cardiothoracic Vascular Surgery)

## 2016-09-19 ENCOUNTER — Ambulatory Visit (HOSPITAL_COMMUNITY)
Admission: RE | Admit: 2016-09-19 | Discharge: 2016-09-19 | Disposition: A | Discharge status: Home / Self Care | Payer: Medicaid Other | Source: Ambulatory Visit | Attending: Cardiology | Admitting: Cardiology

## 2016-09-19 VITALS — BP 90/59 | HR 56 | Resp 16 | Ht <= 58 in | Wt 184.0 lb

## 2016-09-19 DIAGNOSIS — I5022 Chronic systolic (congestive) heart failure: Secondary | ICD-10-CM | POA: Diagnosis not present

## 2016-09-19 DIAGNOSIS — I272 Pulmonary hypertension, unspecified: Secondary | ICD-10-CM | POA: Diagnosis not present

## 2016-09-19 DIAGNOSIS — Z9889 Other specified postprocedural states: Secondary | ICD-10-CM | POA: Diagnosis not present

## 2016-09-19 DIAGNOSIS — I062 Rheumatic aortic stenosis with insufficiency: Secondary | ICD-10-CM | POA: Diagnosis not present

## 2016-09-19 DIAGNOSIS — I5042 Chronic combined systolic (congestive) and diastolic (congestive) heart failure: Secondary | ICD-10-CM

## 2016-09-19 DIAGNOSIS — I099 Rheumatic heart disease, unspecified: Secondary | ICD-10-CM

## 2016-09-19 DIAGNOSIS — I340 Nonrheumatic mitral (valve) insufficiency: Secondary | ICD-10-CM | POA: Diagnosis not present

## 2016-09-19 DIAGNOSIS — I428 Other cardiomyopathies: Secondary | ICD-10-CM

## 2016-09-19 LAB — BASIC METABOLIC PANEL
ANION GAP: 7 (ref 5–15)
BUN: 18 mg/dL (ref 6–20)
CALCIUM: 9.1 mg/dL (ref 8.9–10.3)
CO2: 29 mmol/L (ref 22–32)
Chloride: 102 mmol/L (ref 101–111)
Creatinine, Ser: 1.28 mg/dL — ABNORMAL HIGH (ref 0.44–1.00)
GFR calc Af Amer: 54 mL/min — ABNORMAL LOW (ref >60)
GFR, EST NON AFRICAN AMERICAN: 46 mL/min — AB (ref >60)
Glucose, Bld: 131 mg/dL — ABNORMAL HIGH (ref 65–99)
Potassium: 4 mmol/L (ref 3.5–5.1)
SODIUM: 138 mmol/L (ref 135–145)

## 2016-09-19 NOTE — Progress Notes (Signed)
[address]       [place] [address]             [phone]     CARDIOTHORACIC SURGERY OFFICE NOTE  Referring Provider is Larey Dresser, MD  Primary Cardiologist is Leonie Man, MD PCP is Aura Dials, PA-C   HPI:  Patient is a 56 year old morbidly obese African-American female with long-standing history of rheumatic heart disease with chronic combined systolic and diastolic congestive heart failure status post aortic valve repair and mitral valve repair in 2010 who returns to the office today for follow-up to discuss treatment options further. She was last seen in our office on 07/21/2016. Since then she underwent formal cardiopulmonary stress testing which confirmed the presence of moderate to severely reduced functional capacity related to heart failure and obesity with some degree of chronotropic incompetence.  During this period of time symptoms of congestive heart failure improved dramatically with adjustment in her medical therapy. She has been followed carefully by Dr. Aundra Dubin through the [facility].  Follow-up right heart catheterization performed by Dr. Aundra Dubin on 08/30/2016 revealed optimized filling pressures with only mild pulmonary hypertension. Cardiac output remained low but clinically the patient was doing remarkably well. She returns to our office today for follow-up. She is accompanied by her daughter for her office visit. She states that she is walking every day, as much as a mile at a time. She states that she feels quite well and she only gets short of breath if she really pushes herself physically. She has no shortness of breath with ordinary activity. She no longer has lower extremity edema. Weight has been stable on current diuretic therapy. She is not having any chest pain or chest tightness. She has no dizziness.   Current Outpatient Prescriptions  Medication Sig Dispense Refill  . albuterol (PROVENTIL HFA;VENTOLIN  HFA) 108 (90 BASE) MCG/ACT inhaler Inhale 2 puffs into the lungs every 6 (six) hours as needed for shortness of breath.     [name] aspirin 81 MG chewable tablet Chew 81 mg by mouth daily.     . beclomethasone (QVAR) 40 MCG/ACT inhaler Inhale 2 puffs into the lungs daily as needed.     . carvedilol (COREG) 12.5 MG tablet Take 1 tablet (12.5 mg total) by mouth 2 (two) times daily with a meal. 60 tablet 3  . cycloSPORINE (RESTASIS) 0.05 % ophthalmic emulsion Place 1 drop into both eyes 2 (two) times daily.    . furosemide (LASIX) 40 MG tablet Take 80 mg by mouth daily.    [name] gabapentin (NEURONTIN) 100 MG capsule Take 100 mg by mouth every evening.   0  . hydrocortisone 2.5 % cream Apply 1 application topically daily as needed (itching).     [name] levothyroxine (SYNTHROID, LEVOTHROID) 112 MCG tablet Take 112 mcg by mouth daily before breakfast.     . potassium chloride (K-DUR) 10 MEQ tablet Take 2 tablets (20 mEq total) by mouth daily. 60 tablet 3  . rosuvastatin (CRESTOR) 20 MG tablet Take 1 tablet (20 mg total) by mouth daily. 90 tablet 3  . sacubitril-valsartan (ENTRESTO) 24-26 MG Take 1 tablet by mouth 2 (two) times daily. 60 tablet 3  . spironolactone (ALDACTONE) 25 MG tablet Take 0.5 tablets (12.5 mg total) by mouth daily. 15 tablet 3  . digoxin (LANOXIN) 0.125 MG tablet Take 0.5 tablets (0.0625 mg total) by mouth every other day. 90 tablet 3   No current facility-administered medications  for this visit.       Physical Exam:   BP (!) 90/59 (BP Location: Right Arm, Patient Position: Sitting, Cuff Size: Large)   Pulse (!) 56   Resp 16   Ht 4\' 9"  (1.448 m)   Wt 184 lb (83.5 kg)   LMP 03/06/2012   SpO2 99% Comment: ON RA  BMI 39.82 kg/m   General:  Morbidly obese but well appearing  Chest:   Clear to auscultation with symmetrical breath sounds  CV:   Regular rate and rhythm without murmur  Incisions:  n/a  Abdomen:  Soft and nontender  Extremities:  Warm and well-perfused, no lower extremity  edema  Diagnostic Tests:  Referred for: CHF and Risk Stratification  Procedure: This patient underwent staged symptom-limited exercise treadmill testing using a modified Naughton protocol with expired gas analysis metabolic evaluation during exercise.  Demographics  Age: 65 Ht. (in.) 47 Wt. (lb) 183 BMI: 37   Predicted Peak VO2: 19.8  Gender: Female Ht (cm) 149.9 Wt. (kg) 83    Results  Pre-Exercise PFTs   FVC 1.84 (77%)     FEV1 1.47 (77%)      FEV1/FVC 80 (99%)      MVV 67 (79%)      Exercise Time:  6:30  Speed (mph): 2.0   Grade (%): 7.0    RPE: 15  Reason stopped: Patient ended test due to leg fatigue.  Additional symptoms: Dyspnea (5/10)  Resting HR: 61 Peak HR: 103  (62% age predicted max HR)  BP rest: 98/64 BP peak: 124/70  Peak VO2: 11.7 (59% predicted peak VO2)  VE/VCO2 slope: 40  OUES: 1.10  Peak RER: 1.11  Ventilatory Threshold: 10.7 (54% predicted or measured peak VO2)  Peak RR 51  Peak Ventilation: 40.7  VE/MVV: 61%  PETCO2 at peak: 29  O2pulse: 9  (90% predicted O2pulse)   Interpretation  Notes: Patient gave a very good effort. Pulse-oximetry remained 93-99% for the duration of exercise.   ECG: Resting ECG in normal sinus rhythm with 1st degree AV block and IVCD. HR response blunted. There were frequent isolated PVCs during rest, exercise, and recovery but no sustained arrhythmias. BP response appropriate for the work performed.  PFT: Pre-exercise spirometry demonstrates restrictive patterns. MVV normal.  CPX: Exercise testing with gas exchange demonstrates a moderate to severely reduced peak VO2 of 11.7 ml/kg/min (59% of the age/gender/weight matched sedentary norms). The RER of 1.11 indicates a maximal effort. When adjusted to the patient's ideal body weight of 120.4 lb (54.6 kg) the peak VO2 is 17.8 ml/kg (ibw)/min (67% of the ibw-adjusted predicted). The VE/VCO2 slope is significantly elevated and  indicates excessive dead space ventilation. The oxygen uptake efficiency slope (OUES) is significantly reduced. The VO2 at the ventilatory threshold was low-normal at 54% of the predicted peak VO2. At peak exercise, the ventilation reached 61% of the measured MVV indicating ventilatory limits were beginning to approach. The O2pulse (a surrogate for stroke volume) increased with incremental exercise, reaching peak at 9 ml/beat (90% predicted).    Conclusion: Exercise testing with gas exchange demonstrates moderate to severely reduced functional capacity when compared to matched sedentary norms. Pre-exercise spirometry demonstrates mild restrictive lung physiology, and ventilatory limits were beginning to approach at peak exercise. At peak exercise, patient is primarily moderately circulatory limited mostly due to Heart failure with additional limitations related to her body habitus. VE/VCO2 slope markedly elevated, indicating poor short-term prognosis. Also note chronotropic incompetence.  Test, report and preliminary impression by:  Landis Martins, MS, ACSM-RCEP 08/11/2016 4:50 PM  CPX testing shows moderate to severe limitation due to HF and obesity. There is evidence of chronotropic incompetence.   Glori Bickers, MD  9:47 PM   Right Heart Cath  Conclusion   1. Optimized filling pressures.  2. Cardiac output remains low.  3. Mild pulmonary arterial hypertension.   Patient is feeling great despite ongoing low output.  Will restart digoxin at 0.0625 every other day and spironolactone 12.5 daily with low K.  Will discuss with Dr. Roxy Manns.     Procedural Details/Technique   Technical Details Procedure: Right Heart Cath  Indication: CHF   Procedural Details: The right brachial area was prepped, draped, and anesthetized with 1% lidocaine. There was a pre-existing peripheral IV in the right brachial area. This was replaced with a 13F venous sheath. A Swan-Ganz catheter was used for the  right heart catheterization. Standard protocol was followed for recording of right heart pressures and sampling of oxygen saturations. Fick cardiac output was calculated. There were no immediate procedural complications. The patient was transferred to the post catheterization recovery area for further monitoring.   Estimated blood loss <50 mL.  During this procedure no sedation was administered.    Right Heart   Right Heart Pressures RHC Procedural Findings: Hemodynamics (mmHg) RA mean 4 RV 47/7 PA 44/17, mean 28 PCWP mean 15  Oxygen saturations: PA 53% AO 99%  Cardiac Output (Fick) 2.99  Cardiac Index (Fick) 1.73 PVR 4 WU    Implants     No implant documentation for this case.  PACS Images   Show images for Cardiac catheterization   Link to Procedure Log   Procedure Log    Hemo Data    Most Recent Value  Fick Cardiac Output 2.99 L/min  Fick Cardiac Output Index 1.73 (L/min)/BSA  RA A Wave 8 mmHg  RA V Wave 5 mmHg  RA Mean 4 mmHg  RV Systolic Pressure 47 mmHg  RV Diastolic Pressure 0 mmHg  RV EDP 7 mmHg  PA Systolic Pressure 44 mmHg  PA Diastolic Pressure 17 mmHg  PA Mean 28 mmHg  PW A Wave 16 mmHg  PW V Wave 18 mmHg  PW Mean 15 mmHg  QP/QS 1  TPVR Index 16.77 HRUI    CT ANGIOGRAPHY CHEST, ABDOMEN AND PELVIS  TECHNIQUE: Multidetector CT imaging through the chest, abdomen and pelvis was performed using the standard protocol during bolus administration of intravenous contrast. Multiplanar reconstructed images and MIPs were obtained and reviewed to evaluate the vascular anatomy.  CONTRAST:  Reduced dose of Isovue 370 81ml  COMPARISON:  08/06/2009  FINDINGS: CTA CHEST FINDINGS  Cardiovascular: Incomplete opacification of the pulmonary arterial tree; the exam was not optimized for detection of pulmonary emboli. Previous mitral valve surgery. Mild left ventricular enlargement. Adequate contrast opacification of the thoracic aorta with  no evidence of dissection, aneurysm, or stenosis. There is classic 3-vessel brachiocephalic arch anatomy without proximal stenosis. No significant atheromatous irregularity.  Mediastinum/Nodes: No pericardial effusion. No adenopathy localized.  Lungs/Pleura: No pleural effusion.  No pneumothorax.  Lungs clear.  Musculoskeletal: Previous median sternotomy. Negative for fracture or worrisome bone lesion.  Review of the MIP images confirms the above findings.  CTA ABDOMEN AND PELVIS FINDINGS  VASCULAR  Aorta: No significant atheromatous irregularity. No aneurysm, dissection, or stenosis.  Celiac: Patent without evidence of aneurysm, dissection, vasculitis or significant stenosis.  SMA: Patent without evidence of aneurysm, dissection, vasculitis or significant stenosis.  Renals: Both renal arteries are  patent without evidence of aneurysm, dissection, vasculitis, fibromuscular dysplasia or significant stenosis.  IMA: Patent without evidence of aneurysm, dissection, vasculitis or significant stenosis.  Inflow: Minimal calcified plaque in the proximal left common iliac artery . There is mild dilatation of the right common femoral artery with somewhat beaded appearance in some thin intraluminal webs suggesting fibromuscular dysplasia versus short-segment dissection. She No other significant atheromatous irregularity. No aneurysm, dissection, or stenosis. Mild tortuosity of the iliac arterial systems bilaterally.  Veins: Venous phase imaging not obtained. Patent bilateral renal veins and portal vein incidentally noted.  Review of the MIP images confirms the above findings.  NON-VASCULAR  Hepatobiliary: No focal liver abnormality is seen. Small calcifications in the dependent aspect of the nondilated gallbladder suggest cholelithiasis. No biliary ductal dilatation.  Pancreas: Unremarkable. No pancreatic ductal dilatation or surrounding inflammatory  changes.  Spleen: Normal in size without focal abnormality.  Adrenals/Urinary Tract: Adrenal glands are unremarkable. Kidneys are normal, without renal calculi, focal lesion, or hydronephrosis. Bladder is unremarkable.  Stomach/Bowel: Stomach and small bowel are nondilated. Normal appendix. The colon is nondilated, unremarkable.  If  Lymphatic: No adenopathy localized.  Reproductive: Partially calcified subserosal fibroids. No adnexal mass.  Other: No ascites.  No free air.  Musculoskeletal: No acute or significant osseous findings.  Review of the MIP images confirms the above findings.  IMPRESSION: 1. No aortic aneurysm, dissection, stenosis, or significant atheromatous irregularity. 2. Mild tortuosity of bilateral iliac arterial systems with minimal atheromatous plaque. 3. Nonspecific irregularity in the right common femoral artery may be secondary to fibromuscular dysplasia versus short-segment dissection.   Electronically Signed   By: Lucrezia Europe M.D.   On: 08/23/2016 16:22   Impression:  Patient has long-standing rheumatic valvular heart disease with chronic combined systolic and diastolic congestive heart failure status post aortic valve repair and mitral valve repair in 2010. More recently she has developed progressive aortic stenosis, aortic insufficiency, and mitral regurgitation. Left ventricular systolic function has decreased with ejection fraction estimated at only 25-30%. In this setting the patient developed what appears to be primarily severe secondary mitral regurgitation caused by systolic restriction of the subvalvular apparatus of the mitral valve. Early this year she presented with acute exacerbation of chronic congestive heart failure, but she is now doing remarkably well on medical therapy. Options include proceeding with high risk aortic and mitral valve replacement versus continued medical therapy with close follow-up. Although I am  concerned that the patient will ultimately need surgery at some point in the future, she is doing remarkably well at this time on medical therapy and she is reluctant to consider high risk surgical intervention.   Plan:  I again reviewed options at length with the patient and her daughter in the office today. We discussed the indications, risks, and potential benefits of high risk redo aortic and mitral valve replacement. We discussed the role of medical therapy including concerns regarding her long-term prognosis. All of her questions have been addressed. The patient remains reluctant to consider proceeding with high risk surgery at this time. As long as she remains clinically stable it seems reasonable to continue medical therapy with close follow-up. However, if the patient's activity level decreases and/or she develops recurrent episodes of acute exacerbation of heart failure we may have no choice but to consider high risk surgical intervention. In the meanwhile, I have counseled the patient regarding dietary choices, the need for close follow-up with Dr. Aundra Dubin in the advanced heart failure clinic, and the potential many benefits  of regular exercise and weight loss. All of her questions been addressed. The patient will return to our office in approximately 6 months or sooner should the need arise.  I spent in excess of 15 minutes during the conduct of this office consultation and >50% of this time involved direct face-to-face encounter with the patient for counseling and/or coordination of their care.    Valentina Gu. Roxy Manns, MD 09/19/2016 10:57 AM

## 2016-09-19 NOTE — Patient Instructions (Signed)
Continue all previous medications without any changes at this time  

## 2016-09-23 ENCOUNTER — Ambulatory Visit (HOSPITAL_COMMUNITY)
Admission: RE | Admit: 2016-09-23 | Discharge: 2016-09-23 | Disposition: A | Discharge status: Home / Self Care | Payer: Medicaid Other | Source: Ambulatory Visit | Attending: Cardiology | Admitting: Cardiology

## 2016-09-23 ENCOUNTER — Encounter (HOSPITAL_COMMUNITY): Payer: Self-pay

## 2016-09-23 VITALS — BP 96/53 | HR 58 | Wt 187.4 lb

## 2016-09-23 DIAGNOSIS — I429 Cardiomyopathy, unspecified: Secondary | ICD-10-CM | POA: Diagnosis not present

## 2016-09-23 DIAGNOSIS — I5042 Chronic combined systolic (congestive) and diastolic (congestive) heart failure: Secondary | ICD-10-CM | POA: Diagnosis not present

## 2016-09-23 DIAGNOSIS — I110 Hypertensive heart disease with heart failure: Secondary | ICD-10-CM | POA: Diagnosis not present

## 2016-09-23 DIAGNOSIS — I5022 Chronic systolic (congestive) heart failure: Secondary | ICD-10-CM | POA: Diagnosis present

## 2016-09-23 DIAGNOSIS — I428 Other cardiomyopathies: Secondary | ICD-10-CM

## 2016-09-23 DIAGNOSIS — E039 Hypothyroidism, unspecified: Secondary | ICD-10-CM | POA: Diagnosis not present

## 2016-09-23 DIAGNOSIS — I099 Rheumatic heart disease, unspecified: Secondary | ICD-10-CM | POA: Diagnosis not present

## 2016-09-23 DIAGNOSIS — Z79899 Other long term (current) drug therapy: Secondary | ICD-10-CM | POA: Diagnosis not present

## 2016-09-23 DIAGNOSIS — E785 Hyperlipidemia, unspecified: Secondary | ICD-10-CM | POA: Diagnosis not present

## 2016-09-23 DIAGNOSIS — Z87891 Personal history of nicotine dependence: Secondary | ICD-10-CM | POA: Insufficient documentation

## 2016-09-23 DIAGNOSIS — I050 Rheumatic mitral stenosis: Secondary | ICD-10-CM | POA: Insufficient documentation

## 2016-09-23 DIAGNOSIS — Z7982 Long term (current) use of aspirin: Secondary | ICD-10-CM | POA: Insufficient documentation

## 2016-09-23 DIAGNOSIS — N179 Acute kidney failure, unspecified: Secondary | ICD-10-CM | POA: Diagnosis not present

## 2016-09-23 DIAGNOSIS — I959 Hypotension, unspecified: Secondary | ICD-10-CM | POA: Diagnosis not present

## 2016-09-23 LAB — BASIC METABOLIC PANEL
ANION GAP: 9 (ref 5–15)
BUN: 24 mg/dL — ABNORMAL HIGH (ref 6–20)
CALCIUM: 9.1 mg/dL (ref 8.9–10.3)
CHLORIDE: 99 mmol/L — AB (ref 101–111)
CO2: 27 mmol/L (ref 22–32)
CREATININE: 1.34 mg/dL — AB (ref 0.44–1.00)
GFR calc non Af Amer: 44 mL/min — ABNORMAL LOW (ref >60)
GFR, EST AFRICAN AMERICAN: 51 mL/min — AB (ref >60)
Glucose, Bld: 104 mg/dL — ABNORMAL HIGH (ref 65–99)
Potassium: 4.6 mmol/L (ref 3.5–5.1)
SODIUM: 135 mmol/L (ref 135–145)

## 2016-09-23 LAB — DIGOXIN LEVEL: DIGOXIN LVL: 0.2 ng/mL — AB (ref 0.8–2.0)

## 2016-09-23 MED ORDER — SPIRONOLACTONE 25 MG PO TABS: 25.0000 mg | ORAL_TABLET | Freq: Every day | ORAL | 3 refills | Status: DC

## 2016-09-23 NOTE — Patient Instructions (Signed)
Labs today (will call for abnormal results, otherwise no news is good news)  INCREASE Spironolactone to 25 mg (1 Tablet) Once Daily in the evening.  We have referred you to cardiac rehab, they will contact you for your initial appointment.  Kennyth Lose our Social worker will be working on transportation for you.   Labs in 2 weeks (BMET)  Follow up in 1 Month

## 2016-09-24 NOTE — Progress Notes (Signed)
PCP: Roe Coombs Cardiology: Dr. Ellyn Hack HF Cardiology: Dr. Aundra Dubin  56 yo with history of rheumatic heart disease s/p aortic and mitral valve repairs in 2010 has developed CHF and presents for CHF clinic evaluation.  She had aortic and mitral repairs in 2010.  Afterwards, EF was mildly low in the 40-45% range.  However, in the last year, EF has dropped.  EF 25-30% on 1/18 echo.  TEE showed severe MR, moderate AI, moderate aortic stenosis.  RHC/LHC in 2/18 showed on significantly elevated LV and RV filling pressures.    When I saw her at initial appointment, she was very dyspneic with exertion, with orthopnea and PND.  I adjusted her meds and increased Lasix.  She has seen Dr. Roxy Manns since that time for evaluation for redo valve surgery.  She would require high risk mitral and aortic valve replacements.   I had to cut back on her Entresto with rise in creatinine to 2.43, it has improved.  CPX was done in 3/18 showing moderate to severely decreased functional capacity.  RHC in 4/18 showed optimized filling pressures but low cardiac output.    She saw Dr. Roxy Manns again and was reluctant to undergo surgery yet.  For now, the plan will be close monitoring with surgery if starts to decline or has CHF exacerbation.   No exertional dyspnea with her daily activities.  No orthopnea/PND.  Walks for exercise now.  No chest pain.  No lightheadedness, palpitations, or syncope. She is trying to follow a low Na diet.  Weight is 4 lbs up compared to last appointment.   Labs (2/18): K 3.8 => 3.4, creatinine 1.09 => 1.07, hgb 12.7, BNP 1010 Labs (3/18): K 4.6, creatinine 2.43 => 1.74 Labs (4/18): K 4, creatinine 1.28, digoxin 0.3  PMH: 1. HTN 2. Hypothyroidism 3. Hyperlipidemia 4. Rheumatic heart disease: s/p MV repair and aortic valve repair in 2010, Dr. Roxy Manns. - TEE (2/18): EF 35-30%, dilated LV, rheumatic-appearing aortic valve s/p repair with moderate AS (AVA 1.1 cm^2), moderate AI.  MV s/p repair with  mild-moderate mitral stenosis and eccentric severe MR.   5. Chronic systolic CHF: Nonischemic cardiomyopathy, probably related to valvular disease.   - Echo (11/15): EF 40-45%, - Echo (12/16): EF 35-40%. - Echo (1/18): EF 25-30%.  - LHC/RHC (2/18): Normal coronaries, mean RA 18, mean PA 74/39 mean 52, mean PCWP 31, CI 1.98 Fick. - CPX (3/18): peak VO2 11.7, VE/VCO2 slope 40, RER 1.11 => moderate to severely decreased functional capacity due to HF.  - RHC (4/18): mean RA 4, PA 44/17 mean 28, mean PCWP 15, CI 1.73, PVR 4 WU.   Social History   Social History  . Marital status: Single    Spouse name: N/A  . Number of children: N/A  . Years of education: N/A   Occupational History  . Not on file.   Social History Main Topics  . Smoking status: Former Research scientist (life sciences)  . Smokeless tobacco: Never Used     Comment: quit 2011  . Alcohol use No  . Drug use: No  . Sexual activity: Not Currently   Other Topics Concern  . Not on file   Social History Narrative   Unemployed, on disability.   Now is finally reestablished with a PCP.   She is a former smoker, quit in 2011. Denies alcohol consumption   Tries to walk routinely.   Family History  Problem Relation Age of Onset  . Heart disease Mother   . CVA Mother  ROS: All systems reviewed and negative except as per HPI.   Current Outpatient Prescriptions  Medication Sig Dispense Refill  . albuterol (PROVENTIL HFA;VENTOLIN HFA) 108 (90 BASE) MCG/ACT inhaler Inhale 2 puffs into the lungs every 6 (six) hours as needed for shortness of breath.     [name] aspirin 81 MG chewable tablet Chew 81 mg by mouth daily.     . beclomethasone (QVAR) 40 MCG/ACT inhaler Inhale 2 puffs into the lungs daily as needed.     . carvedilol (COREG) 12.5 MG tablet Take 1 tablet (12.5 mg total) by mouth 2 (two) times daily with a meal. 60 tablet 3  . cycloSPORINE (RESTASIS) 0.05 % ophthalmic emulsion Place 1 drop into both eyes 2 (two) times daily.    . digoxin (LANOXIN)  0.125 MG tablet Take 0.5 tablets (0.0625 mg total) by mouth every other day. 90 tablet 3  . furosemide (LASIX) 40 MG tablet Take 80 mg by mouth daily.    [name] gabapentin (NEURONTIN) 100 MG capsule Take 100 mg by mouth every evening.   0  . hydrocortisone 2.5 % cream Apply 1 application topically daily as needed (itching).     [name] levothyroxine (SYNTHROID, LEVOTHROID) 112 MCG tablet Take 112 mcg by mouth daily before breakfast.     . potassium chloride (K-DUR) 10 MEQ tablet Take 2 tablets (20 mEq total) by mouth daily. 60 tablet 3  . rosuvastatin (CRESTOR) 20 MG tablet Take 1 tablet (20 mg total) by mouth daily. 90 tablet 3  . sacubitril-valsartan (ENTRESTO) 24-26 MG Take 1 tablet by mouth 2 (two) times daily. 60 tablet 3  . spironolactone (ALDACTONE) 25 MG tablet Take 1 tablet (25 mg total) by mouth daily. 30 tablet 3   No current facility-administered medications for this encounter.    BP (!) 96/53 (BP Location: Right Arm, Patient Position: Sitting, Cuff Size: Normal)   Pulse (!) 58   Wt 187 lb 6.4 oz (85 kg)   LMP 03/06/2012   SpO2 99%   BMI 40.55 kg/m  General: NAD Neck: Thick, no JVD, no thyromegaly or thyroid nodule.  Lungs: CTAB CV: Nondisplaced PMI.  Heart regular S1/S2, no S3/S4, 2/6 HSM apex, 2/6 SEM RUSB.  No edema.  No carotid bruit.  Normal pedal pulses.  Abdomen: Soft, nontender, no hepatosplenomegaly, mild distention.  Skin: Intact without lesions or rashes.  Neurologic: Alert and oriented x 3.  Psych: Normal affect. Extremities: No clubbing or cyanosis.  HEENT: Normal.   Assessment/Plan: 1. 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  She had aortic and mitral valve repairs in 2010.  Now the aortic valve is moderate stenotic and moderately regurgitant, and there is severe eccentric MR with mild to moderate mitral stenosis.  RHC showed elevated filling pressures.  I do not think that she would be a very good Mitraclip candidate as she already has a degree of mitral stenosis.   She has seen Dr. Roxy Manns.  To correct the situation, she would need aortic and mitral valve replacements.  This would be very high risk.  However, ongoing medical management will be very high risk for her in the long-term. If she cannot have valve surgery, LVAD as bridge to transplant would be a potential option. Hemodynamics looks better on 4/18 RHC though cardiac output still marginal.  Symptomatically improved.  She saw Dr. Roxy Manns again and is reluctant to undergo surgery.  Will continue medical management for now, surgery if she starts to decline.  2. Chronic systolic CHF: EF 82-95%.  Nonischemic  cardiomyopathy (no significant disease on 2/18 LHC).  It is possible that this cardiomyopathy is driven by her valve disease.  NYHA class II symptoms (improved).  Volume status looks better with higher Lasix. I had to cut back on her Entresto due to low BP and AKI.  CPX was done, showing moderate to severely decreased functional capacity. RHC showed optimized filling pressures but still with low cardiac output.  - She is back on digoxin, check level today.  - Continue Lasix 80 qd.  BMET today.  - Continue Entresto 24/26 bid.   - Increase spironolactone to 25 mg daily.  BMET in 10 days.  - I will refer her to cardiac rehab.  3. AKI: In setting of hypotension and diuresis, creatinine up to 2.43 recently.  Last creatinine was down to 1.28. BMET today.   She will need to followup in 4 weeks.   Loralie Champagne 09/24/2016

## 2016-09-28 ENCOUNTER — Telehealth: Payer: Self-pay | Admitting: Licensed Clinical Social Worker

## 2016-09-28 NOTE — Telephone Encounter (Signed)
CSW referred to assist patient with transportation to cardiac rehab. Patient reports she has medicaid and [employer] for transportation to and from medical appointments. CSW discussed both options and that will qualify for transportation to cardiac rehab as well. Patient will follow up with scheduling rehab visits and then follow up with transportation options. CSW encouraged patient to return call if further assistance needed in transportation. [name], [place], [place]

## 2016-09-29 ENCOUNTER — Other Ambulatory Visit: Payer: Self-pay

## 2016-09-29 MED ORDER — FUROSEMIDE 40 MG PO TABS: 80.0000 mg | ORAL_TABLET | Freq: Every day | ORAL | 1 refills | Status: DC

## 2016-10-06 ENCOUNTER — Telehealth (HOSPITAL_COMMUNITY): Payer: Self-pay

## 2016-10-06 NOTE — Telephone Encounter (Signed)
Verified Medicaid insurance benefits through [employer] [phone]... KJ

## 2016-10-07 ENCOUNTER — Inpatient Hospital Stay (HOSPITAL_COMMUNITY): Admission: RE | Admit: 2016-10-07 | Payer: Medicaid Other | Source: Ambulatory Visit

## 2016-10-14 ENCOUNTER — Ambulatory Visit (HOSPITAL_COMMUNITY)
Admission: RE | Admit: 2016-10-14 | Discharge: 2016-10-14 | Disposition: A | Discharge status: Home / Self Care | Payer: Medicaid Other | Source: Ambulatory Visit | Attending: Internal Medicine | Admitting: Internal Medicine

## 2016-10-14 DIAGNOSIS — I5042 Chronic combined systolic (congestive) and diastolic (congestive) heart failure: Secondary | ICD-10-CM

## 2016-10-14 LAB — BASIC METABOLIC PANEL
Anion gap: 8 (ref 5–15)
BUN: 28 mg/dL — AB (ref 6–20)
CALCIUM: 8.7 mg/dL — AB (ref 8.9–10.3)
CO2: 25 mmol/L (ref 22–32)
CREATININE: 1.26 mg/dL — AB (ref 0.44–1.00)
Chloride: 102 mmol/L (ref 101–111)
GFR calc non Af Amer: 47 mL/min — ABNORMAL LOW (ref >60)
GFR, EST AFRICAN AMERICAN: 55 mL/min — AB (ref >60)
GLUCOSE: 107 mg/dL — AB (ref 65–99)
Potassium: 4.4 mmol/L (ref 3.5–5.1)
Sodium: 135 mmol/L (ref 135–145)

## 2016-10-24 ENCOUNTER — Encounter (HOSPITAL_COMMUNITY): Payer: Self-pay

## 2016-10-24 ENCOUNTER — Ambulatory Visit (HOSPITAL_COMMUNITY)
Admission: RE | Admit: 2016-10-24 | Discharge: 2016-10-24 | Disposition: A | Discharge status: Home / Self Care | Payer: Medicaid Other | Source: Ambulatory Visit | Attending: Cardiology | Admitting: Cardiology

## 2016-10-24 VITALS — BP 110/74 | HR 74 | Wt 185.2 lb

## 2016-10-24 DIAGNOSIS — Z8249 Family history of ischemic heart disease and other diseases of the circulatory system: Secondary | ICD-10-CM | POA: Insufficient documentation

## 2016-10-24 DIAGNOSIS — N183 Chronic kidney disease, stage 3 (moderate): Secondary | ICD-10-CM | POA: Diagnosis not present

## 2016-10-24 DIAGNOSIS — I38 Endocarditis, valve unspecified: Secondary | ICD-10-CM | POA: Diagnosis present

## 2016-10-24 DIAGNOSIS — E785 Hyperlipidemia, unspecified: Secondary | ICD-10-CM | POA: Diagnosis not present

## 2016-10-24 DIAGNOSIS — Z87891 Personal history of nicotine dependence: Secondary | ICD-10-CM | POA: Diagnosis not present

## 2016-10-24 DIAGNOSIS — Z9889 Other specified postprocedural states: Secondary | ICD-10-CM | POA: Diagnosis not present

## 2016-10-24 DIAGNOSIS — N179 Acute kidney failure, unspecified: Secondary | ICD-10-CM | POA: Diagnosis not present

## 2016-10-24 DIAGNOSIS — I340 Nonrheumatic mitral (valve) insufficiency: Secondary | ICD-10-CM | POA: Diagnosis not present

## 2016-10-24 DIAGNOSIS — E039 Hypothyroidism, unspecified: Secondary | ICD-10-CM | POA: Insufficient documentation

## 2016-10-24 DIAGNOSIS — I130 Hypertensive heart and chronic kidney disease with heart failure and stage 1 through stage 4 chronic kidney disease, or unspecified chronic kidney disease: Secondary | ICD-10-CM | POA: Diagnosis not present

## 2016-10-24 DIAGNOSIS — I099 Rheumatic heart disease, unspecified: Secondary | ICD-10-CM

## 2016-10-24 DIAGNOSIS — Z7982 Long term (current) use of aspirin: Secondary | ICD-10-CM | POA: Diagnosis not present

## 2016-10-24 DIAGNOSIS — I5022 Chronic systolic (congestive) heart failure: Secondary | ICD-10-CM | POA: Insufficient documentation

## 2016-10-24 DIAGNOSIS — I5042 Chronic combined systolic (congestive) and diastolic (congestive) heart failure: Secondary | ICD-10-CM

## 2016-10-24 DIAGNOSIS — Z79899 Other long term (current) drug therapy: Secondary | ICD-10-CM | POA: Diagnosis not present

## 2016-10-24 MED ORDER — SACUBITRIL-VALSARTAN 24-26 MG PO TABS: 1.0000 | ORAL_TABLET | Freq: Two times a day (BID) | ORAL | 12 refills | Status: DC

## 2016-10-24 NOTE — Patient Instructions (Signed)
RESTART Entresto 24-26 mg (1 Tablet) Two times Daily  Labs in 10 days (BMET)  Referral has been sent for Cardiac Rehab, they will contact you for initial appointment.  Follow up in 2 Months

## 2016-10-24 NOTE — Progress Notes (Signed)
PCP: Roe Coombs Cardiology: Dr. Ellyn Hack HF Cardiology: Dr. Aundra Dubin  56 yo with history of rheumatic heart disease s/p aortic and mitral valve repairs in 2010 has developed CHF and presents for CHF clinic evaluation.  She had aortic and mitral repairs in 2010.  Afterwards, EF was mildly low in the 40-45% range.  However, in the last year, EF has dropped.  EF 25-30% on 1/18 echo.  TEE showed severe MR, moderate AI, moderate aortic stenosis.  RHC/LHC in 2/18 showed on significantly elevated LV and RV filling pressures.    At her initial appointment, she was very dyspneic with exertion, with orthopnea and PND. Her meds were adjusted and increased Lasix.  She has seen Dr. Roxy Manns since that time for evaluation for redo valve surgery.  She would require high risk mitral and aortic valve replacements.   Her Entresto was decreased to 24/26mg  BID with rise in creatinine to 2.43, it has improved.  CPX was done in 3/18 showing moderate to severely decreased functional capacity.  RHC in 4/18 showed optimized filling pressures but low cardiac output.    She saw Dr. Roxy Manns again and was reluctant to undergo surgery yet.  For now, the plan will be close monitoring with surgery if starts to decline or has CHF exacerbation.   She returns today for HF follow up. She is not taking her Entresto as she ran out of refills and did not follow up on getting refills. She says she is feeling well overall. Denies SOB, she is walking daily around the neighborhood without SOB. No SOB with stairs. Weights at home 180-187 pounds. Eating high sodium foods, drinking more than 2L a day. She is taking her medications with intermittent compliance. She is limited by financial concerns, does have Medicaid.   Labs (2/18): K 3.8 => 3.4, creatinine 1.09 => 1.07, hgb 12.7, BNP 1010 Labs (3/18): K 4.6, creatinine 2.43 => 1.74 Labs (4/18): K 4, creatinine 1.28, digoxin 0.3 Labs 09/2016: K 4.4, creatinine 1.26.   PMH: 1. HTN 2.  Hypothyroidism 3. Hyperlipidemia 4. Rheumatic heart disease: s/p MV repair and aortic valve repair in 2010, Dr. Roxy Manns. - TEE (2/18): EF 35-30%, dilated LV, rheumatic-appearing aortic valve s/p repair with moderate AS (AVA 1.1 cm^2), moderate AI.  MV s/p repair with mild-moderate mitral stenosis and eccentric severe MR.   5. Chronic systolic CHF: Nonischemic cardiomyopathy, probably related to valvular disease.   - Echo (11/15): EF 40-45%, - Echo (12/16): EF 35-40%. - Echo (1/18): EF 25-30%.  - LHC/RHC (2/18): Normal coronaries, mean RA 18, mean PA 74/39 mean 52, mean PCWP 31, CI 1.98 Fick. - CPX (3/18): peak VO2 11.7, VE/VCO2 slope 40, RER 1.11 => moderate to severely decreased functional capacity due to HF.  - RHC (4/18): mean RA 4, PA 44/17 mean 28, mean PCWP 15, CI 1.73, PVR 4 WU.   Social History   Social History  . Marital status: Single    Spouse name: N/A  . Number of children: N/A  . Years of education: N/A   Occupational History  . Not on file.   Social History Main Topics  . Smoking status: Former Research scientist (life sciences)  . Smokeless tobacco: Never Used     Comment: quit 2011  . Alcohol use No  . Drug use: No  . Sexual activity: Not Currently   Other Topics Concern  . Not on file   Social History Narrative   Unemployed, on disability.   Now is finally reestablished  with a PCP.   She is a former smoker, quit in 2011. Denies alcohol consumption   Tries to walk routinely.   Family History  Problem Relation Age of Onset  . Heart disease Mother   . CVA Mother    ROS: All systems reviewed and negative except as per HPI.   Current Outpatient Prescriptions  Medication Sig Dispense Refill  . albuterol (PROVENTIL HFA;VENTOLIN HFA) 108 (90 BASE) MCG/ACT inhaler Inhale 2 puffs into the lungs every 6 (six) hours as needed for shortness of breath.     [name] aspirin 81 MG chewable tablet Chew 81 mg by mouth daily.     . beclomethasone (QVAR) 40 MCG/ACT inhaler Inhale 2 puffs into the lungs  daily as needed.     . carvedilol (COREG) 12.5 MG tablet Take 1 tablet (12.5 mg total) by mouth 2 (two) times daily with a meal. 60 tablet 3  . cycloSPORINE (RESTASIS) 0.05 % ophthalmic emulsion Place 1 drop into both eyes 2 (two) times daily.    . furosemide (LASIX) 40 MG tablet Take 2 tablets (80 mg total) by mouth daily. 30 tablet 1  . gabapentin (NEURONTIN) 100 MG capsule Take 100 mg by mouth every evening.   0  . hydrocortisone 2.5 % cream Apply 1 application topically daily as needed (itching).     [name] levothyroxine (SYNTHROID, LEVOTHROID) 112 MCG tablet Take 112 mcg by mouth daily before breakfast.     . potassium chloride (K-DUR) 10 MEQ tablet Take 2 tablets (20 mEq total) by mouth daily. 60 tablet 3  . rosuvastatin (CRESTOR) 20 MG tablet Take 1 tablet (20 mg total) by mouth daily. 90 tablet 3  . sacubitril-valsartan (ENTRESTO) 24-26 MG Take 1 tablet by mouth 2 (two) times daily. 60 tablet 3  . spironolactone (ALDACTONE) 25 MG tablet Take 1 tablet (25 mg total) by mouth daily. 30 tablet 3   No current facility-administered medications for this encounter.    BP 110/74 (BP Location: Left Arm, Patient Position: Sitting, Cuff Size: Normal)   Pulse 74   Wt 185 lb 3.2 oz (84 kg)   LMP 03/06/2012   SpO2 98%   BMI 40.08 kg/m  General: Obese female, NAD.  Neck: Thick. NO JVD. No thyromegaly or thyroid nodule.  Lungs: Clear bilaterally. Normal effort.  CV: Nondisplaced PMI.  Heart regular S1/S2, no S3/S4, 2/6 HSM apex, 2/6 SEM RUSB.  No edema.  No carotid bruit.  Normal pedal pulses.  Abdomen: Soft, nontender, no hepatosplenomegaly, mild distention.  Skin: Intact without lesions or rashes.  Neurologic: Alert and oriented x 3.  Psych: Normal affect. Extremities: No clubbing or cyanosis.  HEENT: Normal.   Assessment/Plan: 1. 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  She had aortic and mitral valve repairs in 2010.  Now the aortic valve is moderate stenotic and moderately regurgitant,  and there is severe eccentric MR with mild to moderate mitral stenosis.  RHC showed elevated filling pressures. It was felt that she would not be a very good Mitraclip candidate as she already has a degree of mitral stenosis.  She has seen Dr. Roxy Manns.  To correct the situation, she would need aortic and mitral valve replacements.  This would be very high risk.  However, ongoing medical management will be very high risk for her in the long-term. If she cannot have valve surgery, LVAD as bridge to transplant would be a potential option. Hemodynamics looks better on 4/18 RHC though cardiac output still marginal.  Symptomatically improved.  She  saw Dr. Roxy Manns again and is reluctant to undergo surgery.  Will continue medical management for now, surgery if she starts to decline.  2. Chronic systolic CHF: EF 09-73%.  Nonischemic cardiomyopathy (no significant disease on 2/18 LHC).  It is possible that this cardiomyopathy is driven by her valve disease.  - NYHA class II symptoms.  Volume status stable. - She has not been taking her Entresto. Will restart this at 24/26 mg BID. She has Medicaid and prior Josem Kaufmann has been completed. BMET in 10 days after restarting Entresto. Did not do BMET today as she had one last week with stable creatinine.  - Continue Lasix 80mg  daily. She will continue with daily weights.  - Continue digoxin, dig level ok in May 2018.    - Continue spironolactone to 25 mg daily.  - Refer to cardiac rehab.  3. CKD stage III - Will check BMET after restarting Entresto.  - Last creatinine stable. Baseline creatinine 1.2-1.3  Will need repeat Echo in Aug- Sept. BMET in 10 days as above and follow up in 2 months.  Arbutus Leas 10/24/2016

## 2016-10-25 ENCOUNTER — Telehealth (HOSPITAL_COMMUNITY): Payer: Self-pay

## 2016-10-25 NOTE — Telephone Encounter (Signed)
I called and spoke to patient about scheduling for cardiac rehab. Patient needs to check on transportation before scheduling appointment. Patient given my contact information to return my call.

## 2016-10-31 ENCOUNTER — Other Ambulatory Visit: Payer: Self-pay | Admitting: Cardiology

## 2016-11-01 ENCOUNTER — Other Ambulatory Visit (HOSPITAL_COMMUNITY): Payer: Self-pay

## 2016-11-01 MED ORDER — FUROSEMIDE 40 MG PO TABS: 80.0000 mg | ORAL_TABLET | Freq: Every day | ORAL | 6 refills | Status: DC

## 2016-11-03 ENCOUNTER — Ambulatory Visit (HOSPITAL_COMMUNITY)
Admission: RE | Admit: 2016-11-03 | Discharge: 2016-11-03 | Disposition: A | Discharge status: Home / Self Care | Payer: Medicaid Other | Source: Ambulatory Visit | Attending: Internal Medicine | Admitting: Internal Medicine

## 2016-11-03 DIAGNOSIS — I5042 Chronic combined systolic (congestive) and diastolic (congestive) heart failure: Secondary | ICD-10-CM | POA: Diagnosis present

## 2016-11-03 LAB — BASIC METABOLIC PANEL
Anion gap: 8 (ref 5–15)
BUN: 25 mg/dL — AB (ref 6–20)
CHLORIDE: 103 mmol/L (ref 101–111)
CO2: 24 mmol/L (ref 22–32)
CREATININE: 1.29 mg/dL — AB (ref 0.44–1.00)
Calcium: 8.9 mg/dL (ref 8.9–10.3)
GFR calc Af Amer: 53 mL/min — ABNORMAL LOW (ref >60)
GFR calc non Af Amer: 46 mL/min — ABNORMAL LOW (ref >60)
GLUCOSE: 111 mg/dL — AB (ref 65–99)
Potassium: 4.1 mmol/L (ref 3.5–5.1)
SODIUM: 135 mmol/L (ref 135–145)

## 2016-11-07 NOTE — Addendum Note (Signed)
Encounter addended by: Arbutus Leas, NP on: 11/07/2016  8:58 AM<BR>    Actions taken: LOS modified, Follow-up modified

## 2016-11-16 ENCOUNTER — Encounter (HOSPITAL_COMMUNITY): Payer: Self-pay

## 2016-11-16 ENCOUNTER — Telehealth (HOSPITAL_COMMUNITY): Payer: Self-pay

## 2016-11-16 NOTE — Progress Notes (Signed)
I have mailed patient letter with information about cardiac rehab.

## 2016-11-16 NOTE — Telephone Encounter (Signed)
I called and left message on patient voicemail about scheduling for cardiac rehab. I left office contact information on patient voicemail to return call.

## 2016-12-26 ENCOUNTER — Encounter (HOSPITAL_COMMUNITY): Payer: Medicaid Other

## 2017-01-10 ENCOUNTER — Ambulatory Visit (HOSPITAL_COMMUNITY)
Admission: RE | Admit: 2017-01-10 | Discharge: 2017-01-10 | Disposition: A | Discharge status: Home / Self Care | Payer: Medicaid Other | Source: Ambulatory Visit | Attending: Internal Medicine | Admitting: Internal Medicine

## 2017-01-10 VITALS — BP 114/62 | HR 60 | Wt 187.8 lb

## 2017-01-10 DIAGNOSIS — I130 Hypertensive heart and chronic kidney disease with heart failure and stage 1 through stage 4 chronic kidney disease, or unspecified chronic kidney disease: Secondary | ICD-10-CM | POA: Diagnosis present

## 2017-01-10 DIAGNOSIS — I428 Other cardiomyopathies: Secondary | ICD-10-CM | POA: Diagnosis not present

## 2017-01-10 DIAGNOSIS — I099 Rheumatic heart disease, unspecified: Secondary | ICD-10-CM

## 2017-01-10 DIAGNOSIS — I10 Essential (primary) hypertension: Secondary | ICD-10-CM

## 2017-01-10 DIAGNOSIS — I050 Rheumatic mitral stenosis: Secondary | ICD-10-CM | POA: Insufficient documentation

## 2017-01-10 DIAGNOSIS — Z823 Family history of stroke: Secondary | ICD-10-CM | POA: Insufficient documentation

## 2017-01-10 DIAGNOSIS — I272 Pulmonary hypertension, unspecified: Secondary | ICD-10-CM

## 2017-01-10 DIAGNOSIS — Z79899 Other long term (current) drug therapy: Secondary | ICD-10-CM | POA: Insufficient documentation

## 2017-01-10 DIAGNOSIS — Z8249 Family history of ischemic heart disease and other diseases of the circulatory system: Secondary | ICD-10-CM | POA: Diagnosis not present

## 2017-01-10 DIAGNOSIS — N183 Chronic kidney disease, stage 3 (moderate): Secondary | ICD-10-CM | POA: Insufficient documentation

## 2017-01-10 DIAGNOSIS — E039 Hypothyroidism, unspecified: Secondary | ICD-10-CM | POA: Insufficient documentation

## 2017-01-10 DIAGNOSIS — I5022 Chronic systolic (congestive) heart failure: Secondary | ICD-10-CM | POA: Insufficient documentation

## 2017-01-10 DIAGNOSIS — Z7982 Long term (current) use of aspirin: Secondary | ICD-10-CM | POA: Insufficient documentation

## 2017-01-10 DIAGNOSIS — I5042 Chronic combined systolic (congestive) and diastolic (congestive) heart failure: Secondary | ICD-10-CM | POA: Diagnosis not present

## 2017-01-10 DIAGNOSIS — I429 Cardiomyopathy, unspecified: Secondary | ICD-10-CM | POA: Insufficient documentation

## 2017-01-10 DIAGNOSIS — Z9889 Other specified postprocedural states: Secondary | ICD-10-CM

## 2017-01-10 DIAGNOSIS — E785 Hyperlipidemia, unspecified: Secondary | ICD-10-CM | POA: Insufficient documentation

## 2017-01-10 DIAGNOSIS — Z87891 Personal history of nicotine dependence: Secondary | ICD-10-CM | POA: Insufficient documentation

## 2017-01-10 LAB — BASIC METABOLIC PANEL
Anion gap: 8 (ref 5–15)
BUN: 18 mg/dL (ref 6–20)
CALCIUM: 9.1 mg/dL (ref 8.9–10.3)
CHLORIDE: 102 mmol/L (ref 101–111)
CO2: 26 mmol/L (ref 22–32)
CREATININE: 1.12 mg/dL — AB (ref 0.44–1.00)
GFR calc Af Amer: >60 mL/min (ref >60)
GFR calc non Af Amer: 54 mL/min — ABNORMAL LOW (ref >60)
Glucose, Bld: 95 mg/dL (ref 65–99)
Potassium: 4.7 mmol/L (ref 3.5–5.1)
SODIUM: 136 mmol/L (ref 135–145)

## 2017-01-10 LAB — DIGOXIN LEVEL: DIGOXIN LVL: 0.3 ng/mL — AB (ref 0.8–2.0)

## 2017-01-10 MED ORDER — FUROSEMIDE 40 MG PO TABS: 80.0000 mg | ORAL_TABLET | Freq: Every day | ORAL | 3 refills | Status: DC

## 2017-01-10 MED ORDER — SPIRONOLACTONE 25 MG PO TABS: 25.0000 mg | ORAL_TABLET | Freq: Every day | ORAL | 3 refills | Status: DC

## 2017-01-10 MED ORDER — ASPIRIN 81 MG PO CHEW: 81.0000 mg | CHEWABLE_TABLET | Freq: Every day | ORAL | 3 refills | Status: DC

## 2017-01-10 MED ORDER — ROSUVASTATIN CALCIUM 20 MG PO TABS: 20.0000 mg | ORAL_TABLET | Freq: Every day | ORAL | 3 refills | Status: DC

## 2017-01-10 MED ORDER — CARVEDILOL 12.5 MG PO TABS: 12.5000 mg | ORAL_TABLET | Freq: Two times a day (BID) | ORAL | 3 refills | Status: DC

## 2017-01-10 MED ORDER — SACUBITRIL-VALSARTAN 49-51 MG PO TABS: 1.0000 | ORAL_TABLET | Freq: Two times a day (BID) | ORAL | 3 refills | Status: DC

## 2017-01-10 MED ORDER — DIGOXIN 125 MCG PO TABS: 0.0625 mg | ORAL_TABLET | ORAL | 3 refills | Status: DC

## 2017-01-10 NOTE — Progress Notes (Signed)
Advanced Heart Failure Clinic Note   PCP: Roe Coombs Cardiology: Dr. Ellyn Hack HF Cardiology: Dr. Aundra Dubin  56 yo with history of rheumatic heart disease s/p aortic and mitral valve repairs in 2010 has developed CHF and presents for CHF clinic evaluation.  She had aortic and mitral repairs in 2010.  Afterwards, EF was mildly low in the 40-45% range.  However, in the last year, EF has dropped.  EF 25-30% on 1/18 echo.  TEE showed severe MR, moderate AI, moderate aortic stenosis.  RHC/LHC in 2/18 showed on significantly elevated LV and RV filling pressures.    At her initial appointment, she was very dyspneic with exertion, with orthopnea and PND. Her meds were adjusted and increased Lasix.  She has seen Dr. Roxy Manns since that time for evaluation for redo valve surgery.  She would require high risk mitral and aortic valve replacements.   Her Entresto was decreased to 24/26mg  BID with rise in creatinine to 2.43, it has improved.  CPX was done in 3/18 showing moderate to severely decreased functional capacity.  RHC in 4/18 showed optimized filling pressures but low cardiac output.    She saw Dr. Roxy Manns again and was reluctant to undergo surgery yet.  For now, the plan will be close monitoring with surgery if starts to decline or has CHF exacerbation.   She presents today for regular follow up. Feeling good overall. She has the usual soreness around her sternotomy keloid, but otherwise stable. Denies exertional chest pain. Denies SOB. Walks around the neighborhood each day weather permitting. Can walk 5 minutes to the store and back. Denies dizziness or lightheadedness. Taking all medications as directed. Trying to watch salt and fluid. Slips up ever now and then. Weight stable. Weight at home ~180.   Labs (2/18): K 3.8 => 3.4, creatinine 1.09 => 1.07, hgb 12.7, BNP 1010 Labs (3/18): K 4.6, creatinine 2.43 => 1.74 Labs (4/18): K 4, creatinine 1.28, digoxin 0.3 Labs 09/2016: K 4.4, creatinine 1.26.    PMH: 1. HTN 2. Hypothyroidism 3. Hyperlipidemia 4. Rheumatic heart disease: s/p MV repair and aortic valve repair in 2010, Dr. Roxy Manns. - TEE (2/18): EF 35-30%, dilated LV, rheumatic-appearing aortic valve s/p repair with moderate AS (AVA 1.1 cm^2), moderate AI.  MV s/p repair with mild-moderate mitral stenosis and eccentric severe MR.   5. Chronic systolic CHF: Nonischemic cardiomyopathy, probably related to valvular disease.   - Echo (11/15): EF 40-45%, - Echo (12/16): EF 35-40%. - Echo (1/18): EF 25-30%.  - LHC/RHC (2/18): Normal coronaries, mean RA 18, mean PA 74/39 mean 52, mean PCWP 31, CI 1.98 Fick. - CPX (3/18): peak VO2 11.7, VE/VCO2 slope 40, RER 1.11 => moderate to severely decreased functional capacity due to HF.  - RHC (4/18): mean RA 4, PA 44/17 mean 28, mean PCWP 15, CI 1.73, PVR 4 WU.   Social History   Social History  . Marital status: Single    Spouse name: N/A  . Number of children: N/A  . Years of education: N/A   Occupational History  . Not on file.   Social History Main Topics  . Smoking status: Former Research scientist (life sciences)  . Smokeless tobacco: Never Used     Comment: quit 2011  . Alcohol use No  . Drug use: No  . Sexual activity: Not Currently   Other Topics Concern  . Not on file   Social History Narrative   Unemployed, on disability.   Now is finally reestablished with a PCP.  She is a former smoker, quit in 2011. Denies alcohol consumption   Tries to walk routinely.   Family History  Problem Relation Age of Onset  . Heart disease Mother   . CVA Mother    Review of systems complete and found to be negative unless listed in HPI.    Current Outpatient Prescriptions  Medication Sig Dispense Refill  . albuterol (PROVENTIL HFA;VENTOLIN HFA) 108 (90 BASE) MCG/ACT inhaler Inhale 2 puffs into the lungs every 6 (six) hours as needed for shortness of breath.     [name] aspirin 81 MG chewable tablet Chew 81 mg by mouth daily.     . beclomethasone (QVAR) 40 MCG/ACT  inhaler Inhale 2 puffs into the lungs daily as needed.     . carvedilol (COREG) 12.5 MG tablet Take 1 tablet (12.5 mg total) by mouth 2 (two) times daily with a meal. 60 tablet 3  . digoxin (LANOXIN) 0.125 MG tablet Take 0.0625 mg by mouth every other day.    . furosemide (LASIX) 40 MG tablet Take 2 tablets (80 mg total) by mouth daily. 60 tablet 6  . gabapentin (NEURONTIN) 100 MG capsule Take 100 mg by mouth every evening.   0  . levothyroxine (SYNTHROID, LEVOTHROID) 112 MCG tablet Take 112 mcg by mouth daily before breakfast.     . potassium chloride (K-DUR) 10 MEQ tablet Take 2 tablets (20 mEq total) by mouth daily. 60 tablet 3  . sacubitril-valsartan (ENTRESTO) 24-26 MG Take 1 tablet by mouth 2 (two) times daily. 60 tablet 12  . spironolactone (ALDACTONE) 25 MG tablet Take 1 tablet (25 mg total) by mouth daily. 30 tablet 3  . rosuvastatin (CRESTOR) 20 MG tablet Take 1 tablet (20 mg total) by mouth daily. (Patient not taking: Reported on 01/10/2017) 90 tablet 3   No current facility-administered medications for this encounter.    Vitals:   01/10/17 1458  BP: 114/62  Pulse: 60  SpO2: 100%  Weight: 187 lb 12.8 oz (85.2 kg)    Wt Readings from Last 3 Encounters:  01/10/17 187 lb 12.8 oz (85.2 kg)  10/24/16 185 lb 3.2 oz (84 kg)  09/23/16 187 lb 6.4 oz (85 kg)   Physical Exam  General: Obese, elderly appearing. NAD.  HEENT: Normal Neck: Thick. No JVD. Carotids 2+ bilat; no bruits. No thyromegaly or nodule noted. Cor: PMI nondisplaced. RRR, 2/6 HSM Apex, 2/6 SEM RUSB.  Lungs: CTAB, normal effort. Abdomen: Soft, non-tender, non-distended, no HSM. No bruits or masses. +BS  Extremities: No cyanosis, clubbing, rash, R and LLE no edema.  Neuro: Alert & orientedx3, cranial nerves grossly intact. moves all 4 extremities w/o difficulty. Affect pleasant   Assessment/Plan: 1. 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  She had aortic and mitral valve repairs in 2010.  Now the aortic valve  is moderate stenotic and moderately regurgitant, and there is severe eccentric MR with mild to moderate mitral stenosis.  RHC showed elevated filling pressures. It was felt that she would not be a very good Mitraclip candidate as she already has a degree of mitral stenosis.  She has seen Dr. Roxy Manns.  To correct the situation, she would need aortic and mitral valve replacements.  This would be very high risk.  However, ongoing medical management will be very high risk for her in the long-term. If she cannot have valve surgery, LVAD as bridge to transplant would be a potential option. Hemodynamics looks better on 4/18 RHC though cardiac output still marginal.  Symptomatically improved.  She saw Dr. Roxy Manns again and is reluctant to undergo surgery.  Will continue medical management for now, surgery if she starts to decline.  - No change. Will further evaluate her valves with upcoming echo.  2. Chronic systolic CHF: EF 82-64%.  Nonischemic cardiomyopathy (no significant disease on 2/18 LHC).  It is possible that this cardiomyopathy is driven by her valve disease.  - NYHA class II symptoms.  - Volume status stable on exam.  - Continue lasix 80 mg daily.  - Increase Entresto 49/51 mg BID.  - Continue digoxin 0.125 mg daily. Check level today.  - Continue spironolactone 25 mg daily.  - Has been referred to cardiac rehab.   - Reinforced fluid restriction to < 2 L daily, sodium restriction to less than 2000 mg daily, and the importance of daily weights.   3. CKD stage III - BMET today, repeat 7-14 days with Entresto increase.    Doing well overall. BMET today and repeat 1-2 weeks with Entresto increase. Repeat Echo. RTC 2 months to see MD.    Shirley Friar, PA-C  01/10/2017  Greater than 50% of the 25 minute visit was spent in counseling/coordination of care regarding disease state education, salt/fluid restriction, medication compliance, and Echocardiogram.

## 2017-01-10 NOTE — Progress Notes (Signed)
Advanced Heart Failure Medication Review by a Pharmacist  Does the patient  feel that his/her medications are working for him/her?  yes  Has the patient been experiencing any side effects to the medications prescribed?  no  Does the patient measure his/her own blood pressure or blood glucose at home?  no   Does the patient have any problems obtaining medications due to transportation or finances?   No- [place] Medicaid  Understanding of regimen: good Understanding of indications: good Potential of compliance: good Patient understands to avoid NSAIDs. Patient understands to avoid decongestants.  Issues to address at subsequent visits: None   Pharmacist comments: [name] is a pleasant 55 yo F presenting without a medication list but with good recall of her regimen. She reports good compliance with her regimen but does report not having her Crestor for a few days since she ran out of refills. She did not have any other medication-related questions or concerns for me at this time.   Ruta Hinds. Velva Harman, PharmD, BCPS, CPP Clinical Pharmacist Pager: [phone] Phone: [phone] 01/10/2017 3:06 PM      Time with patient: 10 minutes Preparation and documentation time: 2 minutes Total time: 12 minutes

## 2017-01-10 NOTE — Patient Instructions (Addendum)
INCREASE Entresto to 49/51 mg, one tab twice a day   Labs today We will only contact you if something comes back abnormal or we need to make some changes. Otherwise no news is good news!   Labs needed in 7-10 days   Your physician has requested that you have an echocardiogram. Echocardiography is a painless test that uses sound waves to create images of your heart. It provides your doctor with information about the size and shape of your heart and how well your heart's chambers and valves are working. This procedure takes approximately one hour. There are no restrictions for this procedure.  Your physician recommends that you schedule a follow-up appointment in: 2 months with Dr Aundra Dubin

## 2017-01-19 ENCOUNTER — Ambulatory Visit (HOSPITAL_BASED_OUTPATIENT_CLINIC_OR_DEPARTMENT_OTHER)
Admission: RE | Admit: 2017-01-19 | Discharge: 2017-01-19 | Disposition: A | Discharge status: Home / Self Care | Payer: Medicaid Other | Source: Ambulatory Visit | Attending: Cardiology | Admitting: Cardiology

## 2017-01-19 ENCOUNTER — Ambulatory Visit (HOSPITAL_COMMUNITY)
Admission: RE | Admit: 2017-01-19 | Discharge: 2017-01-19 | Disposition: A | Discharge status: Home / Self Care | Payer: Medicaid Other | Source: Ambulatory Visit | Attending: Cardiology | Admitting: Cardiology

## 2017-01-19 DIAGNOSIS — I5042 Chronic combined systolic (congestive) and diastolic (congestive) heart failure: Secondary | ICD-10-CM

## 2017-01-19 DIAGNOSIS — I080 Rheumatic disorders of both mitral and aortic valves: Secondary | ICD-10-CM | POA: Diagnosis not present

## 2017-01-19 DIAGNOSIS — I110 Hypertensive heart disease with heart failure: Secondary | ICD-10-CM | POA: Insufficient documentation

## 2017-01-19 LAB — ECHOCARDIOGRAM COMPLETE
AO mean calculated velocity dopler: 258 cm/s
AOVTI: 95.7 cm
AV Area VTI index: 0.41 cm2/m2
AV Area VTI: 0.78 cm2
AV Mean grad: 30 mmHg
AV peak Index: 0.42
AV pk vel: 339 cm/s
AVA: 0.76 cm2
AVAREAMEANV: 0.76 cm2
AVAREAMEANVIN: 0.4 cm2/m2
AVCELMEANRAT: 0.29
AVPG: 46 mmHg
Ao pk vel: 0.3 m/s
CHL CUP AV VEL: 0.76
CHL CUP DOP CALC LVOT VTI: 28.4 cm
CHL CUP MV DEC (S): 408
CHL CUP MV M VEL: 99.5
EWDT: 408 ms
FS: 14 % — AB (ref 28–44)
IVS/LV PW RATIO, ED: 1.27
LA ID, A-P, ES: 38 mm
LA diam index: 2.02 cm/m2
LAVOL: 62.9 mL
LAVOLA4C: 66.7 mL
LAVOLIN: 33.5 mL/m2
LEFT ATRIUM END SYS DIAM: 38 mm
LVOT area: 2.57 cm2
LVOT diameter: 18.1 mm
LVOT peak VTI: 0.3 cm
LVOT peak grad rest: 4 mmHg
LVOTPV: 103 cm/s
Lateral S' vel: 7.83 cm/s
MV Annulus VTI: 57.7 cm
MV pk A vel: 121 m/s
MVAP: 2.22 cm2
MVG: 4 mmHg
MVPG: 6 mmHg
MVPKEVEL: 121 m/s
P 1/2 time: 584 ms
P 1/2 time: 99 ms
PW: 7.98 mm — AB (ref 0.6–1.1)
RV TAPSE: 16.1 mm
Valve area index: 0.41

## 2017-01-19 LAB — BASIC METABOLIC PANEL
ANION GAP: 7 (ref 5–15)
BUN: 27 mg/dL — ABNORMAL HIGH (ref 6–20)
CHLORIDE: 105 mmol/L (ref 101–111)
CO2: 26 mmol/L (ref 22–32)
CREATININE: 1.21 mg/dL — AB (ref 0.44–1.00)
Calcium: 9.3 mg/dL (ref 8.9–10.3)
GFR calc non Af Amer: 49 mL/min — ABNORMAL LOW (ref >60)
GFR, EST AFRICAN AMERICAN: 57 mL/min — AB (ref >60)
Glucose, Bld: 114 mg/dL — ABNORMAL HIGH (ref 65–99)
POTASSIUM: 4.4 mmol/L (ref 3.5–5.1)
SODIUM: 138 mmol/L (ref 135–145)

## 2017-01-19 NOTE — Progress Notes (Signed)
  Echocardiogram 2D Echocardiogram has been performed.  Tresa Res 01/19/2017, 12:32 PM

## 2017-01-24 ENCOUNTER — Telehealth (HOSPITAL_COMMUNITY): Payer: Self-pay

## 2017-01-24 NOTE — Telephone Encounter (Signed)
ECHOCARDIOGRAM COMPLETE  Order: [id number]  Status:  Final result Visible to patient:  No (Not Released) Dx:  Chronic combined systolic and diastol...  Notes recorded by Effie Berkshire, RN on 01/24/2017 at 2:10 PM EDT Attempted to call, no answer, no VM set up yet. ------  Notes recorded by Shirley Friar, PA-C on 01/20/2017 at 9:37 AM EDT Please let her know her EF is slightly improved from previous at 30-35%   Thanks!   Legrand Como [address]" [place], PA-C 01/20/2017 9:37 AM

## 2017-01-31 ENCOUNTER — Other Ambulatory Visit (HOSPITAL_COMMUNITY): Payer: Self-pay | Admitting: Cardiology

## 2017-03-14 ENCOUNTER — Ambulatory Visit (HOSPITAL_COMMUNITY)
Admission: RE | Admit: 2017-03-14 | Discharge: 2017-03-14 | Disposition: A | Discharge status: Home / Self Care | Payer: Medicaid Other | Source: Ambulatory Visit | Attending: Cardiology | Admitting: Cardiology

## 2017-03-14 ENCOUNTER — Encounter (HOSPITAL_COMMUNITY): Payer: Self-pay | Admitting: Cardiology

## 2017-03-14 VITALS — BP 110/60 | HR 64 | Wt 190.4 lb

## 2017-03-14 DIAGNOSIS — I5042 Chronic combined systolic (congestive) and diastolic (congestive) heart failure: Secondary | ICD-10-CM | POA: Diagnosis not present

## 2017-03-14 DIAGNOSIS — I5022 Chronic systolic (congestive) heart failure: Secondary | ICD-10-CM | POA: Diagnosis present

## 2017-03-14 DIAGNOSIS — I428 Other cardiomyopathies: Secondary | ICD-10-CM | POA: Diagnosis not present

## 2017-03-14 DIAGNOSIS — Z87891 Personal history of nicotine dependence: Secondary | ICD-10-CM | POA: Diagnosis not present

## 2017-03-14 DIAGNOSIS — E785 Hyperlipidemia, unspecified: Secondary | ICD-10-CM | POA: Insufficient documentation

## 2017-03-14 DIAGNOSIS — I38 Endocarditis, valve unspecified: Secondary | ICD-10-CM | POA: Insufficient documentation

## 2017-03-14 DIAGNOSIS — Z79899 Other long term (current) drug therapy: Secondary | ICD-10-CM | POA: Diagnosis not present

## 2017-03-14 DIAGNOSIS — Z9889 Other specified postprocedural states: Secondary | ICD-10-CM | POA: Diagnosis not present

## 2017-03-14 DIAGNOSIS — Z823 Family history of stroke: Secondary | ICD-10-CM | POA: Insufficient documentation

## 2017-03-14 DIAGNOSIS — I099 Rheumatic heart disease, unspecified: Secondary | ICD-10-CM | POA: Insufficient documentation

## 2017-03-14 DIAGNOSIS — Z7982 Long term (current) use of aspirin: Secondary | ICD-10-CM | POA: Diagnosis not present

## 2017-03-14 DIAGNOSIS — I130 Hypertensive heart and chronic kidney disease with heart failure and stage 1 through stage 4 chronic kidney disease, or unspecified chronic kidney disease: Secondary | ICD-10-CM | POA: Insufficient documentation

## 2017-03-14 DIAGNOSIS — N183 Chronic kidney disease, stage 3 (moderate): Secondary | ICD-10-CM | POA: Diagnosis not present

## 2017-03-14 DIAGNOSIS — E039 Hypothyroidism, unspecified: Secondary | ICD-10-CM | POA: Insufficient documentation

## 2017-03-14 LAB — BASIC METABOLIC PANEL
Anion gap: 7 (ref 5–15)
BUN: 16 mg/dL (ref 6–20)
CHLORIDE: 107 mmol/L (ref 101–111)
CO2: 25 mmol/L (ref 22–32)
Calcium: 9 mg/dL (ref 8.9–10.3)
Creatinine, Ser: 1.1 mg/dL — ABNORMAL HIGH (ref 0.44–1.00)
GFR calc Af Amer: >60 mL/min (ref >60)
GFR calc non Af Amer: 55 mL/min — ABNORMAL LOW (ref >60)
GLUCOSE: 101 mg/dL — AB (ref 65–99)
POTASSIUM: 4.7 mmol/L (ref 3.5–5.1)
Sodium: 139 mmol/L (ref 135–145)

## 2017-03-14 LAB — DIGOXIN LEVEL: Digoxin Level: 0.3 ng/mL — ABNORMAL LOW (ref 0.8–2.0)

## 2017-03-14 MED ORDER — ISOSORB DINITRATE-HYDRALAZINE 20-37.5 MG PO TABS: 0.5000 | ORAL_TABLET | Freq: Three times a day (TID) | ORAL | 3 refills | Status: DC

## 2017-03-14 NOTE — Patient Instructions (Addendum)
Start Bidil 1/2 tab, three times a day  Labs drawn today (if we do not call you, then your lab work was stable)   Your physician recommends that you schedule a follow-up appointment in: [place] physician recommends that you schedule a follow-up appointment in: 2 months

## 2017-03-14 NOTE — Progress Notes (Signed)
Advanced Heart Failure Clinic Note   PCP: Roe Coombs Cardiology: Dr. Ellyn Hack HF Cardiology: Dr. Aundra Dubin  56 yo with history of rheumatic heart disease s/p aortic and mitral valve repairs in 2010 presents for followup of CHF.  She had aortic and mitral repairs in 2010.  Afterwards, EF was mildly low in the 40-45% range.  However, EF had dropped to 25-30% on 1/18 echo.  TEE showed severe MR, moderate AI, moderate aortic stenosis.  RHC/LHC in 2/18 showed significantly elevated LV and RV filling pressures.    At her initial appointment here, she was very dyspneic with exertion, with orthopnea and PND. Her meds were adjusted and Lasix increased.  She has seen Dr. Roxy Manns since that time for evaluation for redo valve surgery.  She would require high risk mitral and aortic valve replacements. For now, the plan will be close monitoring with surgery if starts to decline or has CHF exacerbation. CPX was done in 3/18 showing moderate to severely decreased functional capacity.  RHC in 4/18 showed optimized filling pressures but low cardiac output.  Repeat echo in 8/18 showed EF 30-35%, severe AI with moderate AS but only mild MR noted.  The RV was mildly dilated with mildly decreased systolic function.   Weight is up 3 lbs since last appointment.  Symptomatically, she is doing pretty well.  No dyspnea except with heavy exertion.  No orthopnea/PND.  No lightheadedness.  No chest pain. BP stable.   Labs (2/18): K 3.8 => 3.4, creatinine 1.09 => 1.07, hgb 12.7, BNP 1010 Labs (3/18): K 4.6, creatinine 2.43 => 1.74 Labs (4/18): K 4, creatinine 1.28, digoxin 0.3 Labs 09/2016: K 4.4, creatinine 1.26.  Labs 01/19/2017: K 4.4, Creatinine 1.21, digoxin 0.3  PMH: 1. HTN 2. Hypothyroidism 3. Hyperlipidemia 4. Rheumatic heart disease: s/p MV repair and aortic valve repair in 2010, Dr. Roxy Manns. - TEE (2/18): EF 35-30%, dilated LV, rheumatic-appearing aortic valve s/p repair with moderate AS (AVA 1.1 cm^2), moderate AI.   MV s/p repair with mild-moderate mitral stenosis and eccentric severe MR.   - Echo (8/18): Severe AI but only moderate AS, MR was only mild on this study (may have missed full jet) with mean MV gradient 4 mmHg.  5. Chronic systolic CHF: Nonischemic cardiomyopathy, probably related to valvular disease.   - Echo (11/15): EF 40-45%, - Echo (12/16): EF 35-40%. - Echo (1/18): EF 25-30%.  - LHC/RHC (2/18): Normal coronaries, mean RA 18, mean PA 74/39 mean 52, mean PCWP 31, CI 1.98 Fick. - CPX (3/18): peak VO2 11.7, VE/VCO2 slope 40, RER 1.11 => moderate to severely decreased functional capacity due to HF.  - RHC (4/18): mean RA 4, PA 44/17 mean 28, mean PCWP 15, CI 1.73, PVR 4 WU.  - Echo (8/18): EF 30-35%, severe AI with moderate AS but only mild MR noted with mean MV gradient 4 mmHg.  The RV was mildly dilated with mildly decreased systolic function.  Social History   Social History  . Marital status: Single    Spouse name: N/A  . Number of children: N/A  . Years of education: N/A   Occupational History  . Not on file.   Social History Main Topics  . Smoking status: Former Research scientist (life sciences)  . Smokeless tobacco: Never Used     Comment: quit 2011  . Alcohol use No  . Drug use: No  . Sexual activity: Not Currently   Other Topics Concern  . Not on file   Social History Narrative  Unemployed, on disability.   Now is finally reestablished with a PCP.   She is a former smoker, quit in 2011. Denies alcohol consumption   Tries to walk routinely.   Family History  Problem Relation Age of Onset  . Heart disease Mother   . CVA Mother    Review of systems complete and found to be negative unless listed in HPI.    Current Outpatient Prescriptions  Medication Sig Dispense Refill  . albuterol (PROVENTIL HFA;VENTOLIN HFA) 108 (90 BASE) MCG/ACT inhaler Inhale 2 puffs into the lungs every 6 (six) hours as needed for shortness of breath.     [name] aspirin 81 MG chewable tablet Chew 1 tablet (81 mg total)  by mouth daily. 30 tablet 3  . beclomethasone (QVAR) 40 MCG/ACT inhaler Inhale 2 puffs into the lungs daily as needed.     . carvedilol (COREG) 12.5 MG tablet Take 1 tablet (12.5 mg total) by mouth 2 (two) times daily with a meal. 60 tablet 3  . digoxin (LANOXIN) 0.125 MG tablet Take 0.5 tablets (0.0625 mg total) by mouth every other day. 15 tablet 3  . furosemide (LASIX) 40 MG tablet Take 2 tablets (80 mg total) by mouth daily. 60 tablet 3  . gabapentin (NEURONTIN) 100 MG capsule Take 100 mg by mouth every evening.   0  . levothyroxine (SYNTHROID, LEVOTHROID) 112 MCG tablet Take 112 mcg by mouth daily before breakfast.     . potassium chloride (K-DUR) 10 MEQ tablet take 2 tablets by mouth once daily 60 tablet 3  . rosuvastatin (CRESTOR) 20 MG tablet Take 1 tablet (20 mg total) by mouth daily. 90 tablet 3  . sacubitril-valsartan (ENTRESTO) 49-51 MG Take 1 tablet by mouth 2 (two) times daily. 60 tablet 3  . spironolactone (ALDACTONE) 25 MG tablet Take 1 tablet (25 mg total) by mouth daily. 30 tablet 3   No current facility-administered medications for this encounter.    Vitals:   03/14/17 1413  BP: 110/60  Pulse: 64  SpO2: 98%  Weight: 190 lb 6.4 oz (86.4 kg)    Wt Readings from Last 3 Encounters:  03/14/17 190 lb 6.4 oz (86.4 kg)  01/10/17 187 lb 12.8 oz (85.2 kg)  10/24/16 185 lb 3.2 oz (84 kg)   Physical Exam  General:  Well appearing. No resp difficulty HEENT: normal Neck: supple. no JVD. Fullness noted  Carotids 2+ bilat; no bruits. No lymphadenopathy or thryomegaly appreciated. Cor: PMI nondisplaced. Regular rate & rhythm. 2/6 systolic murmur across precordium, 2/6 diastolic murmur along sternal border. Sternal scar keloid noted.  Lungs: clear Abdomen: soft, nontender, nondistended. No hepatosplenomegaly. No bruits or masses. Good bowel sounds. Extremities: no cyanosis, clubbing, rash, edema Neuro: alert & orientedx3, cranial nerves grossly intact. moves all 4 extremities w/o  difficulty. Affect pleasant  Assessment/Plan: 1. 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  She had aortic and mitral valve repairs in 2010.   It was felt that she would not be a very good Mitraclip candidate as she already has a degree of mitral stenosis.  TEE in 2/18 showed moderate AS/moderate AI , mild to moderate MS, severe eccentric MR.  Echo in 8/18 showed moderate AS/severe AI but only mild mitral stenosis and mild MR noted.  MR was eccentric on TEE so may not have been fully visualized on 8/18 TTE.  She has seen Dr. Roxy Manns.  To correct the situation, she would need aortic and mitral valve replacements.  This would be high risk.  However, ongoing  medical management will be very high risk for her in the long-term. If she cannot have valve surgery, LVAD as bridge to transplant would be a potential option. Hemodynamics looks better on 4/18 RHC though cardiac output still marginal.  Symptomatically improved.  She saw Dr. Roxy Manns again in 4/18 and is reluctant to undergo surgery.   - No change.  2. Chronic systolic CHF:  Nonischemic cardiomyopathy (no significant disease on 2/18 LHC).  It is possible that this cardiomyopathy is driven by her valvular disease. Echo (8/18) with EF 30-35% RV mildly dilated.  NYHA class II symptoms. Volume status stable on exam.  - Continue lasix 80 mg daily. BMET today.  - Continue Entresto 49/51 mg BID.  - Add 1/2 tablet Bidil three times a day.  - Continue digoxin 0.125 mg daily. Check level today.   - Continue spironolactone 25 mg daily.  - If she does not undergo surgery, will need to consider ICD.  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  It is conceivable that EF could improve with valve surgery, precluding ICD.   3. CKD stage III: Creatinine baseline 1.2  (12/2016)  - BMET today   Check BMET and dig level.  Follow up in 2 weeks with pharmacy clinic for Bidil titration, then 2 months  with Dr. Aundra Dubin.    Darrick Grinder, NP  03/14/2017  Patient seen with NP, agree with the above note.  I made adjustments to the note to reflect my thoughts.    On exam, she does not appear volume overloaded.  She has both systolic and diastolic murmurs.   Patient's last echo in 8/18 showed EF 30-35% with severe AI, moderate AS, mild MS, and mild MR.  It is possible the MR was underestimated on TTE (TEE in 2/18 showed eccentric severe MR).  Her cardiomyopathy may be caused by the valvular disease.  Ideally, she would have AVR and MVR.  She has been reluctant to undergo surgery.  Currently, her symptoms are well-controlled, NYHA class II.  - Continue current cardiac regimen and add Bidil 1/2 tab tid.  - BMET/digoxin level today.  - Current diuretic dose appears adequate.  - As above, ICD is going to be a consideration if she does not go to surgery.  - Followup 2 wks pharmacy clinic and 2 months me.   Loralie Champagne 03/15/2017

## 2017-03-20 ENCOUNTER — Encounter: Payer: Medicaid Other | Admitting: Thoracic Surgery (Cardiothoracic Vascular Surgery)

## 2017-03-28 ENCOUNTER — Ambulatory Visit (HOSPITAL_COMMUNITY)
Admission: RE | Admit: 2017-03-28 | Discharge: 2017-03-28 | Disposition: A | Discharge status: Home / Self Care | Payer: Medicaid Other | Source: Ambulatory Visit | Attending: Internal Medicine | Admitting: Internal Medicine

## 2017-03-28 DIAGNOSIS — I38 Endocarditis, valve unspecified: Secondary | ICD-10-CM | POA: Insufficient documentation

## 2017-03-28 DIAGNOSIS — N183 Chronic kidney disease, stage 3 (moderate): Secondary | ICD-10-CM | POA: Diagnosis not present

## 2017-03-28 DIAGNOSIS — I959 Hypotension, unspecified: Secondary | ICD-10-CM | POA: Diagnosis not present

## 2017-03-28 DIAGNOSIS — Z9889 Other specified postprocedural states: Secondary | ICD-10-CM | POA: Insufficient documentation

## 2017-03-28 DIAGNOSIS — I5022 Chronic systolic (congestive) heart failure: Secondary | ICD-10-CM | POA: Insufficient documentation

## 2017-03-28 NOTE — Progress Notes (Signed)
[name]: [facility]  HPI:  56 yo African American F with history of rheumatic heart disease s/p aortic and mitral valve repairs in 2010 presents for followup of CHF.  She had aortic and mitral repairs in 2010.  Afterwards, EF was mildly low in the 40-45% range.  However, EF had dropped to 25-30% on 1/18 echo.  TEE showed severe MR, moderate AI, moderate aortic stenosis.  RHC/LHC in 2/18 showed significantly elevated LV and RV filling pressures.    At her initial appointment here, she was very dyspneic with exertion, with orthopnea and PND. Her meds were adjusted and Lasix increased.  She has seen Dr. Roxy Manns since that time for evaluation for redo valve surgery.  She would require high risk mitral and aortic valve replacements. For now, the plan will be close monitoring with surgery if starts to decline or has CHF exacerbation. CPX was done in 3/18 showing moderate to severely decreased functional capacity.  RHC in 4/18 showed optimized filling pressures but low cardiac output.  Repeat echo in 8/18 showed EF 30-35%, severe AI with moderate AS but only mild MR noted.  The RV was mildly dilated with mildly decreased systolic function.   She returns today for pharmacist-led Colleen medication titration. At last Colleen clinic visit on 03/14/17, Bidil 1/2 tab TID was started. Since then she states that she has felt lightheaded and tired and initially had a headache with it but this has improved. Today she still feels tired and somewhat lightheaded. Took all of her medications this am and reports excellent compliance. No regular exercise but she does walk to the store which is about 1 mile from her house without difficulty or SOB. No dyspnea except with heavy exertion.  No orthopnea/PND.  No chest pain.    [name] Shortness of breath/dyspnea on exertion? no  . Orthopnea/PND? no . Edema? no . Lightheadedness/dizziness? Yes  . Daily weights at home? Yes - stable ~188 lb . Blood pressure/heart rate monitoring at home?  no . Following low-sodium/fluid-restricted diet? Yes - broiled chicken, fish, vegetables (some canned ve  Colleen Medications: Carvedilol 12.5 mg PO BID Digoxin 0.0625 mg PO QOD Furosemide 80 mg PO daily  Bidil 1/2 tablet PO TID KCl 20 mEq PO daily Entresto 49-51 mg PO BID Spironolactone 25 mg PO daily   Has the patient been experiencing any side effects to the medications prescribed?  Yes - lightheadedness/headache with new Bidil  Does the patient have any problems obtaining medications due to transportation or finances?   No - [place] Medicaid  Understanding of regimen: good Understanding of indications: good Potential of compliance: good Patient understands to avoid NSAIDs. Patient understands to avoid decongestants.    Pertinent Lab Values: . 03/14/17: Serum creatinine 1.10 (BL 1.1-1.2), BUN 16, Potassium 4.7, Sodium 139, Digoxin 0.3   Vital Signs: . Weight: 188 lb (dry weight: 185-190 lb) . Blood pressure: 82/60 mmHg  . Heart rate: 62 bpm    Assessment: 1. Chronicsystolic CHF (EF 40-34% on 01/19/17), due to NICM (?valvular). NYHA class IIsymptoms.  - Volume status stable  - With symptomatic hypotension, will discontinue Bidil   - Continue current carvedilol 12.5 mg BID, digoxin 0.0625 mg QOD, furosemide 80 mg daily, KCl 20 meq daily, Entresto 49-51 mg BID and spironolactone 25 mg daily (all of which she had been stable on for months prior to addition of Bidil) - Basic disease state pathophysiology, medication indication, mechanism and side effects reviewed at length with patient and he verbalized understanding 2. Valvular  heart disease (probably rheumatic)  - No change 3. CKD stage III  - Recent BMET stable (SCr BL ~1.2)     Plan: 1) Medication changes: Based on clinical presentation, vital signs and recent labs will discontinue Bidil  2) Labs: PRN 3) Follow-up: Pharmacy visit on 04/11/17   Connell Bognar K. Velva Harman, PharmD, BCPS, CPP Clinical Pharmacist Pager:  [phone] Phone: [phone] 03/28/2017 11:04 AM

## 2017-03-28 NOTE — Patient Instructions (Addendum)
It was great to see you today!  Please STOP taking the Bidil today.   You can try docusate once daily over the counter to help with your constipation.   You are scheduled to see the pharmacist again on 04/11/17 at 11:00 am.

## 2017-04-11 ENCOUNTER — Ambulatory Visit (HOSPITAL_COMMUNITY)
Admission: RE | Admit: 2017-04-11 | Discharge: 2017-04-11 | Disposition: A | Discharge status: Home / Self Care | Payer: Medicaid Other | Source: Ambulatory Visit | Attending: Internal Medicine | Admitting: Internal Medicine

## 2017-04-11 DIAGNOSIS — N183 Chronic kidney disease, stage 3 (moderate): Secondary | ICD-10-CM | POA: Diagnosis not present

## 2017-04-11 DIAGNOSIS — I5022 Chronic systolic (congestive) heart failure: Secondary | ICD-10-CM | POA: Insufficient documentation

## 2017-04-11 DIAGNOSIS — I38 Endocarditis, valve unspecified: Secondary | ICD-10-CM | POA: Diagnosis not present

## 2017-04-11 NOTE — Progress Notes (Signed)
HF MD: [facility]  HPI:  Ms Dattilio is a 56 yo African American F with history of rheumatic heart disease s/p aortic and mitral valve repairs in 2010presents for followup of CHF. She had aortic and mitral repairs in 2010. Afterwards, EF was mildly low in the 40-45% range. However, EF had dropped to25-30% on 1/18 echo. TEE showed severe MR, moderate AI, moderate aortic stenosis. RHC/LHC in 2/18 showed significantly elevated LV and RV filling pressures.   At her initial appointmenthere, she was very dyspneic with exertion, with orthopnea and PND. Her meds were adjusted andLasixincreased. She has seen Dr. Roxy Manns since that time for evaluation for redo valve surgery. She would require high risk mitral and aortic valve replacements. For now, the plan will be close monitoring with surgery if starts to decline or has CHF exacerbation. CPX was done in 3/18 showing moderate to severely decreased functional capacity. RHC in 4/18 showed optimized filling pressures but low cardiac output. Repeat echo in 8/18 showed EF 30-35%, severe AI with moderate AS but only mild MR noted. The RV was mildly dilated with mildly decreased systolic function.   She returns today for pharmacist-led HF medication follow-up. At last HF clinic visit on 03/28/17 her Bidil 1/2 tab TID was discontinued due to symptomatic hypotension. Today she reports feeling much better. Her energy is much improved and she no longer feels dizzy or lightheaded. She took all of her medications this morning and reports excellent compliance.She has been able to walk more frequently without difficulty, around 1 mile and is trying to lose weight.  No dyspnea except with heavy exertion. No orthopnea/PND. No chest pain.She has had intermittent sharp shooting pain in her upper chest area - advised her to bring this up with Dr Ricard Dillon. Continues to have some abdominal cramping likely due to her history of stomach ulcers. Has had some relief with prn docusate.  Have advised her to f/u with her PCP if this gets any worse before her appointment with them in March.     . Shortness of breath/dyspnea on exertion? no  . Orthopnea/PND? no . Edema? no . Lightheadedness/dizziness? no . Daily weights at home? Yes - stable at183 lb  . Blood pressure/heart rate monitoring at home? no . Following low-sodium/fluid-restricted diet? yes  HF Medications: Carvedilol 12.5 mg PO BID Digoxin 0.0625 mg PO QOD Furosemide 80 mg PO daily  KCl 20 mEq PO daily Entresto 49-51 mg PO BID Spironolactone 25 mg PO daily   Has the patient been experiencing any side effects to the medications prescribed?  no  Does the patient have any problems obtaining medications due to transportation or finances?   no  Understanding of regimen: excellent Understanding of indications: excellent Potential of compliance: excellent Patient understands to avoid NSAIDs. Patient understands to avoid decongestants.    Pertinent Lab Values:  03/14/17: Serum creatinine 1.10 (BL 1.1-1.2), BUN 16, Potassium 4.7, Sodium 139, Digoxin 0.3  Vital Signs: . Weight: 186.8 (dry weight: 188 lb) . Blood pressure: 104/60 mmHg   . Heart rate: 64 bpm   Assessment: 1. Chronicsystolic CHF (EF 34-19%), due to NICM (?valvular). NYHA class IIsymptoms. - Volume status stable   - Lightheadedness and fatigue has resolved since stopping BiDil at last visit  - Continue carvedilol 12.5 mg BID, digoxin 0.0625 mg QOD, furosemide 80 mg daily, KCl 20 meq daily, Entresto 49-51 mg BID and spironolactone 25 mg daily  - Basic disease state pathophysiology, medication indication, mechanism and side effects reviewed at length with patient and  he verbalized understanding  2. Valvular heart disease (probably rheumatic)  - No change  3. CKD stage III  - Recent BMET stable (SCr BL ~1.2)   Plan: 1) Medication changes: Based on clinical presentation, vital signs and recent labs will continue current medications as  prescribed  2) Labs: prn  3) Follow-up: Dr. Aundra Dubin on 05/11/17  Dalia Heading, PharmD Candidate   Ruta Hinds. Velva Harman, PharmD, BCPS, CPP Clinical Pharmacist Pager: [phone] Phone: [phone] 04/11/2017 10:49 AM

## 2017-04-11 NOTE — Patient Instructions (Addendum)
It was great to see you today!  Your blood pressure looks much better!   Please continue your current medications.   Please keep your appointment with Dr. Aundra Dubin on 05/11/17 at 2:20 PM.

## 2017-05-11 ENCOUNTER — Ambulatory Visit (HOSPITAL_COMMUNITY)
Admission: RE | Admit: 2017-05-11 | Discharge: 2017-05-11 | Disposition: A | Discharge status: Home / Self Care | Payer: Medicaid Other | Source: Ambulatory Visit | Attending: Cardiology | Admitting: Cardiology

## 2017-05-11 ENCOUNTER — Encounter (HOSPITAL_COMMUNITY): Payer: Self-pay | Admitting: Cardiology

## 2017-05-11 ENCOUNTER — Other Ambulatory Visit (HOSPITAL_COMMUNITY): Payer: Self-pay | Admitting: *Deleted

## 2017-05-11 ENCOUNTER — Encounter (HOSPITAL_COMMUNITY): Payer: Self-pay | Admitting: *Deleted

## 2017-05-11 VITALS — BP 95/47 | HR 65 | Wt 188.0 lb

## 2017-05-11 DIAGNOSIS — N183 Chronic kidney disease, stage 3 (moderate): Secondary | ICD-10-CM | POA: Insufficient documentation

## 2017-05-11 DIAGNOSIS — I130 Hypertensive heart and chronic kidney disease with heart failure and stage 1 through stage 4 chronic kidney disease, or unspecified chronic kidney disease: Secondary | ICD-10-CM | POA: Insufficient documentation

## 2017-05-11 DIAGNOSIS — I428 Other cardiomyopathies: Secondary | ICD-10-CM | POA: Diagnosis not present

## 2017-05-11 DIAGNOSIS — I429 Cardiomyopathy, unspecified: Secondary | ICD-10-CM | POA: Insufficient documentation

## 2017-05-11 DIAGNOSIS — Z9889 Other specified postprocedural states: Secondary | ICD-10-CM

## 2017-05-11 DIAGNOSIS — I099 Rheumatic heart disease, unspecified: Secondary | ICD-10-CM

## 2017-05-11 DIAGNOSIS — Z79899 Other long term (current) drug therapy: Secondary | ICD-10-CM | POA: Diagnosis not present

## 2017-05-11 DIAGNOSIS — Z87891 Personal history of nicotine dependence: Secondary | ICD-10-CM | POA: Insufficient documentation

## 2017-05-11 DIAGNOSIS — Z7982 Long term (current) use of aspirin: Secondary | ICD-10-CM | POA: Diagnosis not present

## 2017-05-11 DIAGNOSIS — I080 Rheumatic disorders of both mitral and aortic valves: Secondary | ICD-10-CM

## 2017-05-11 DIAGNOSIS — I959 Hypotension, unspecified: Secondary | ICD-10-CM | POA: Insufficient documentation

## 2017-05-11 DIAGNOSIS — I5042 Chronic combined systolic (congestive) and diastolic (congestive) heart failure: Secondary | ICD-10-CM | POA: Diagnosis not present

## 2017-05-11 DIAGNOSIS — I5022 Chronic systolic (congestive) heart failure: Secondary | ICD-10-CM | POA: Diagnosis not present

## 2017-05-11 DIAGNOSIS — I38 Endocarditis, valve unspecified: Secondary | ICD-10-CM | POA: Insufficient documentation

## 2017-05-11 LAB — BASIC METABOLIC PANEL
ANION GAP: 7 (ref 5–15)
BUN: 37 mg/dL — AB (ref 6–20)
CALCIUM: 9.2 mg/dL (ref 8.9–10.3)
CO2: 23 mmol/L (ref 22–32)
Chloride: 104 mmol/L (ref 101–111)
Creatinine, Ser: 1.34 mg/dL — ABNORMAL HIGH (ref 0.44–1.00)
GFR calc Af Amer: 51 mL/min — ABNORMAL LOW (ref >60)
GFR, EST NON AFRICAN AMERICAN: 44 mL/min — AB (ref >60)
Glucose, Bld: 92 mg/dL (ref 65–99)
POTASSIUM: 4.9 mmol/L (ref 3.5–5.1)
SODIUM: 134 mmol/L — AB (ref 135–145)

## 2017-05-11 LAB — DIGOXIN LEVEL

## 2017-05-11 NOTE — Patient Instructions (Signed)
Labs today (will call for abnormal results, otherwise no news is good news)  You have been scheduled for a transesophageal echocardiogram (TEE). See instruction sheet for additional details.  Follow up as scheduled in 3 months.

## 2017-05-12 NOTE — Progress Notes (Signed)
Advanced Heart Failure Clinic Note   PCP: Roe Coombs Cardiology: Dr. Ellyn Hack HF Cardiology: Dr. Aundra Dubin  56 yo with history of rheumatic heart disease s/p aortic and mitral valve repairs in 2010 presents for followup of CHF.  She had aortic and mitral repairs in 2010.  Afterwards, EF was mildly low in the 40-45% range.  However, EF had dropped to 25-30% on 1/18 echo.  TEE showed severe MR, moderate AI, moderate aortic stenosis.  RHC/LHC in 2/18 showed significantly elevated LV and RV filling pressures.    At her initial appointment here, she was very dyspneic with exertion, with orthopnea and PND. Her meds were adjusted and Lasix increased.  She has seen Dr. Roxy Manns since that time for evaluation for redo valve surgery.  She would require high risk mitral and aortic valve replacements. For now, the plan will be close monitoring with surgery if starts to decline or has CHF exacerbation. CPX was done in 3/18 showing moderate to severely decreased functional capacity.  RHC in 4/18 showed optimized filling pressures but low cardiac output.  Repeat echo in 8/18 showed EF 30-35%, severe AI with moderate AS but only mild MR noted.  The RV was mildly dilated with mildly decreased systolic function.   She returns today for followup of CHF. She stopped Bidil after last appointment due to low BP. She continues to be reluctant to have her aortic valve replaced.  She denies any significant exertional dyspnea.  She is active with chores around her house and walks some for exercise.  No lightheadedness. Rare atypical chest pain. Weight is down 2 lbs.    ECG (personally reviewed): NSR, inferior and anterolateral Qs  Labs (2/18): K 3.8 => 3.4, creatinine 1.09 => 1.07, hgb 12.7, BNP 1010 Labs (3/18): K 4.6, creatinine 2.43 => 1.74 Labs (4/18): K 4, creatinine 1.28, digoxin 0.3 Labs 09/2016: K 4.4, creatinine 1.26.  Labs 01/19/2017: K 4.4, Creatinine 1.21, digoxin 0.3 Labs (10/18): K 4.7, creatinine 1.10, digoxin  0.3  PMH: 1. HTN 2. Hypothyroidism 3. Hyperlipidemia 4. Rheumatic heart disease: s/p MV repair and aortic valve repair in 2010, Dr. Roxy Manns. - TEE (2/18): EF 35-30%, dilated LV, rheumatic-appearing aortic valve s/p repair with moderate AS (AVA 1.1 cm^2), moderate AI.  MV s/p repair with mild-moderate mitral stenosis and eccentric severe MR.   - Echo (8/18): Severe AI but only moderate AS, MR was only mild on this study (may have missed full jet) with mean MV gradient 4 mmHg.  5. Chronic systolic CHF: Nonischemic cardiomyopathy, probably related to valvular disease.   - Echo (11/15): EF 40-45%, - Echo (12/16): EF 35-40%. - Echo (1/18): EF 25-30%.  - LHC/RHC (2/18): Normal coronaries, mean RA 18, mean PA 74/39 mean 52, mean PCWP 31, CI 1.98 Fick. - CPX (3/18): peak VO2 11.7, VE/VCO2 slope 40, RER 1.11 => moderate to severely decreased functional capacity due to HF.  - RHC (4/18): mean RA 4, PA 44/17 mean 28, mean PCWP 15, CI 1.73, PVR 4 WU.  - Echo (8/18): EF 30-35%, severe AI with moderate AS but only mild MR noted with mean MV gradient 4 mmHg.  The RV was mildly dilated with mildly decreased systolic function.  Social History   Socioeconomic History  . Marital status: Single    Spouse name: Not on file  . Number of children: Not on file  . Years of education: Not on file  . Highest education level: Not on file  Social Needs  . Financial  resource strain: Not on file  . Food insecurity - worry: Not on file  . Food insecurity - inability: Not on file  . Transportation needs - medical: Not on file  . Transportation needs - non-medical: Not on file  Occupational History  . Not on file  Tobacco Use  . Smoking status: Former Research scientist (life sciences)  . Smokeless tobacco: Never Used  . Tobacco comment: quit 2011  Substance and Sexual Activity  . Alcohol use: No  . Drug use: No  . Sexual activity: Not Currently  Other Topics Concern  . Not on file  Social History Narrative   Unemployed, on disability.    Now is finally reestablished with a PCP.   She is a former smoker, quit in 2011. Denies alcohol consumption   Tries to walk routinely.   Family History  Problem Relation Age of Onset  . Heart disease Mother   . CVA Mother    Review of systems complete and found to be negative unless listed in HPI.    Current Outpatient Medications  Medication Sig Dispense Refill  . acetaminophen (TYLENOL) 325 MG tablet Take 650 mg every 6 (six) hours as needed by mouth for mild pain.    [name] albuterol (PROVENTIL HFA;VENTOLIN HFA) 108 (90 BASE) MCG/ACT inhaler Inhale 2 puffs into the lungs every 6 (six) hours as needed for shortness of breath.     [name] aspirin 81 MG chewable tablet Chew 1 tablet (81 mg total) by mouth daily. 30 tablet 3  . beclomethasone (QVAR) 40 MCG/ACT inhaler Inhale 2 puffs into the lungs daily as needed.     . carvedilol (COREG) 12.5 MG tablet Take 1 tablet (12.5 mg total) by mouth 2 (two) times daily with a meal. 60 tablet 3  . digoxin (LANOXIN) 0.125 MG tablet Take 0.5 tablets (0.0625 mg total) by mouth every other day. 15 tablet 3  . docusate sodium (COLACE) 100 MG capsule Take 100 mg by mouth daily as needed for mild constipation.    . furosemide (LASIX) 40 MG tablet Take 2 tablets (80 mg total) by mouth daily. 60 tablet 3  . gabapentin (NEURONTIN) 100 MG capsule Take 100 mg at bedtime as needed by mouth (chest pain).   0  . levothyroxine (SYNTHROID, LEVOTHROID) 125 MCG tablet Take 125 mcg daily by mouth.  0  . potassium chloride (K-DUR) 10 MEQ tablet take 2 tablets by mouth once daily 60 tablet 3  . rosuvastatin (CRESTOR) 20 MG tablet Take 1 tablet (20 mg total) by mouth daily. 90 tablet 3  . sacubitril-valsartan (ENTRESTO) 49-51 MG Take 1 tablet by mouth 2 (two) times daily. 60 tablet 3  . spironolactone (ALDACTONE) 25 MG tablet Take 1 tablet (25 mg total) by mouth daily. 30 tablet 3   No current facility-administered medications for this encounter.    Vitals:   05/11/17 1419    BP: (!) 95/47  Pulse: 65  SpO2: 96%  Weight: 188 lb (85.3 kg)    Wt Readings from Last 3 Encounters:  05/11/17 188 lb (85.3 kg)  04/11/17 186 lb 12.8 oz (84.7 kg)  03/28/17 188 lb 6.4 oz (85.5 kg)   Physical Exam  General: NAD Neck: No JVD, no thyromegaly or thyroid nodule.  Lungs: Clear to auscultation bilaterally with normal respiratory effort. CV: Nondisplaced PMI.  Heart regular S1/S2, no S3/S4, 3/6 crescendo-decrescendo systolic murmur RUSB, 2/6 diastolic murmur.  No peripheral edema.  No carotid bruit.  Normal pedal pulses.  Abdomen: Soft, nontender, no hepatosplenomegaly, no  distention.  Skin: Intact without lesions or rashes.  Neurologic: Alert and oriented x 3.  Psych: Normal affect. Extremities: No clubbing or cyanosis.  HEENT: Normal.   Assessment/Plan: 1. 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  She had aortic and mitral valve repairs in 2010.   It was felt that she would not be a very good Mitraclip candidate as she already has a degree of mitral stenosis.  TEE in 2/18 showed moderate AS/moderate AI , mild to moderate MS, severe eccentric MR.  Echo in 8/18 showed moderate AS/severe AI but only mild mitral stenosis and mild MR noted.  MR was eccentric on TEE so may not have been fully visualized on 8/18 TTE.  She has seen Dr. Roxy Manns.  To correct the situation, she would need aortic and mitral valve replacements.  This would be higher risk with low EF but not prohibitive. Hemodynamics looks better on 4/18 RHC though cardiac output still marginal.  Symptomatically improved and still reluctant to undergo valve surgery.  - I will repeat a TEE at 6 months in 2/19. We discussed risks/benefits of the procedure and she agreed to it.  Will then need to re-address valve surgery.   2. Chronic systolic CHF:  Nonischemic cardiomyopathy (no significant disease on 2/18 LHC).  It is possible that this cardiomyopathy is driven by her valvular disease. Echo (8/18) with EF 30-35% RV mildly  dilated.  NYHA class II symptoms. Volume status stable on exam.  - Continue lasix 80 mg daily. BMET today.  - Continue Entresto 49/51 mg BID.  - Hypotensive with 1/2 tab Bidil tid, will leave off.  - Continue digoxin 0.125 mg daily. Check level today.    - Continue spironolactone 25 mg daily.  - If she does not have surgery will need to consider ICD.  Last echo showed IVCD but QRS only 136 msec so CRT benefit would not likely be large unless QRS widens over time.  It is conceivable that EF could improve with valve surgery, precluding ICD.   3. CKD stage II-III: Creatinine down to 1.1 in 10/18. BMET today.    Loralie Champagne 05/12/2017

## 2017-05-13 ENCOUNTER — Other Ambulatory Visit (HOSPITAL_COMMUNITY): Payer: Self-pay | Admitting: Student

## 2017-05-17 ENCOUNTER — Other Ambulatory Visit (HOSPITAL_COMMUNITY): Payer: Self-pay | Admitting: *Deleted

## 2017-05-17 MED ORDER — CARVEDILOL 12.5 MG PO TABS: 12.5000 mg | ORAL_TABLET | Freq: Two times a day (BID) | ORAL | 3 refills | Status: DC

## 2017-06-16 ENCOUNTER — Other Ambulatory Visit (HOSPITAL_COMMUNITY): Payer: Self-pay | Admitting: Student

## 2017-06-16 MED ORDER — POTASSIUM CHLORIDE ER 10 MEQ PO TBCR: 20.0000 meq | EXTENDED_RELEASE_TABLET | Freq: Every day | ORAL | 3 refills | Status: DC

## 2017-06-26 ENCOUNTER — Encounter (HOSPITAL_COMMUNITY): Payer: Self-pay | Admitting: *Deleted

## 2017-06-28 ENCOUNTER — Ambulatory Visit (HOSPITAL_COMMUNITY)
Admission: RE | Admit: 2017-06-28 | Discharge: 2017-06-28 | Disposition: A | Discharge status: Home / Self Care | Payer: Medicaid Other | Source: Ambulatory Visit | Attending: Cardiology | Admitting: Cardiology

## 2017-06-28 ENCOUNTER — Encounter (HOSPITAL_COMMUNITY): Payer: Self-pay | Admitting: Cardiology

## 2017-06-28 ENCOUNTER — Encounter (HOSPITAL_COMMUNITY)
Admission: RE | Disposition: A | Discharge status: Home / Self Care | Payer: Self-pay | Source: Ambulatory Visit | Attending: Cardiology

## 2017-06-28 ENCOUNTER — Ambulatory Visit (HOSPITAL_BASED_OUTPATIENT_CLINIC_OR_DEPARTMENT_OTHER)
Admission: RE | Admit: 2017-06-28 | Discharge: 2017-06-28 | Disposition: A | Discharge status: Home / Self Care | Payer: Medicaid Other | Source: Ambulatory Visit | Attending: Cardiology | Admitting: Cardiology

## 2017-06-28 ENCOUNTER — Other Ambulatory Visit: Payer: Self-pay

## 2017-06-28 DIAGNOSIS — N183 Chronic kidney disease, stage 3 (moderate): Secondary | ICD-10-CM | POA: Diagnosis not present

## 2017-06-28 DIAGNOSIS — I5022 Chronic systolic (congestive) heart failure: Secondary | ICD-10-CM | POA: Diagnosis not present

## 2017-06-28 DIAGNOSIS — E039 Hypothyroidism, unspecified: Secondary | ICD-10-CM | POA: Insufficient documentation

## 2017-06-28 DIAGNOSIS — Z87891 Personal history of nicotine dependence: Secondary | ICD-10-CM | POA: Insufficient documentation

## 2017-06-28 DIAGNOSIS — I080 Rheumatic disorders of both mitral and aortic valves: Secondary | ICD-10-CM

## 2017-06-28 DIAGNOSIS — Z952 Presence of prosthetic heart valve: Secondary | ICD-10-CM | POA: Insufficient documentation

## 2017-06-28 DIAGNOSIS — E785 Hyperlipidemia, unspecified: Secondary | ICD-10-CM | POA: Diagnosis not present

## 2017-06-28 DIAGNOSIS — I130 Hypertensive heart and chronic kidney disease with heart failure and stage 1 through stage 4 chronic kidney disease, or unspecified chronic kidney disease: Secondary | ICD-10-CM | POA: Insufficient documentation

## 2017-06-28 DIAGNOSIS — I351 Nonrheumatic aortic (valve) insufficiency: Secondary | ICD-10-CM | POA: Diagnosis not present

## 2017-06-28 HISTORY — PX: TEE WITHOUT CARDIOVERSION: SHX5443

## 2017-06-28 SURGERY — ECHOCARDIOGRAM, TRANSESOPHAGEAL
Anesthesia: Moderate Sedation

## 2017-06-28 MED ORDER — MIDAZOLAM HCL 5 MG/ML IJ SOLN
INTRAMUSCULAR | Status: AC
  Filled 2017-06-28: qty 2

## 2017-06-28 MED ORDER — FENTANYL CITRATE (PF) 100 MCG/2ML IJ SOLN
INTRAMUSCULAR | Status: DC | PRN
  Administered 2017-06-28 (×2): 25 ug via INTRAVENOUS

## 2017-06-28 MED ORDER — BUTAMBEN-TETRACAINE-BENZOCAINE 2-2-14 % EX AERO
INHALATION_SPRAY | CUTANEOUS | Status: DC | PRN
  Administered 2017-06-28: 2 via TOPICAL

## 2017-06-28 MED ORDER — SODIUM CHLORIDE 0.9 % IV SOLN
INTRAVENOUS | Status: DC
  Administered 2017-06-28: 11:00:00 via INTRAVENOUS

## 2017-06-28 MED ORDER — MIDAZOLAM HCL 10 MG/2ML IJ SOLN
INTRAMUSCULAR | Status: DC | PRN
  Administered 2017-06-28: 2 mg via INTRAVENOUS
  Administered 2017-06-28 (×3): 1 mg via INTRAVENOUS

## 2017-06-28 MED ORDER — FENTANYL CITRATE (PF) 100 MCG/2ML IJ SOLN
INTRAMUSCULAR | Status: AC
  Filled 2017-06-28: qty 2

## 2017-06-28 MED ORDER — SODIUM CHLORIDE 0.9 % IV SOLN: INTRAVENOUS | Status: DC

## 2017-06-28 NOTE — Discharge Instructions (Signed)

## 2017-06-28 NOTE — Progress Notes (Signed)
  Echocardiogram Echocardiogram Transesophageal has been performed.  [name] F 06/28/2017, 12:39 PM

## 2017-06-28 NOTE — H&P (Signed)
Advanced Heart Failure Clinic Note   PCP: Roe Coombs Cardiology: Dr. Ellyn Hack HF Cardiology: Dr. 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  She had aortic and mitral repairs in 2010.  Afterwards, EF was mildly low in the 40-45% range.  However, EF had dropped to 25-30% on 1/18 echo.  TEE showed severe MR, moderate AI, moderate aortic stenosis.  RHC/LHC in 2/18 showed significantly elevated LV and RV filling pressures.    At her initial appointment here, she was very dyspneic with exertion, with orthopnea and PND. Her meds were adjusted and Lasix increased.  She has seen Dr. Roxy Manns since that time for evaluation for redo valve surgery.  She would require high risk mitral and aortic valve replacements. For now, the plan will be close monitoring with surgery if starts to decline or has CHF exacerbation. CPX was done in 3/18 showing moderate to severely decreased functional capacity.  RHC in 4/18 showed optimized filling pressures but low cardiac output.  Repeat echo in 8/18 showed EF 30-35%, severe AI with moderate AS but only mild MR noted.  The RV was mildly dilated with mildly decreased systolic function.   She returns today TEE to reassess valves.   ECG (personally reviewed): NSR, inferior and anterolateral Qs  Labs (2/18): K 3.8 => 3.4, creatinine 1.09 => 1.07, hgb 12.7, BNP 1010 Labs (3/18): K 4.6, creatinine 2.43 => 1.74 Labs (4/18): K 4, creatinine 1.28, digoxin 0.3 Labs 09/2016: K 4.4, creatinine 1.26.  Labs 01/19/2017: K 4.4, Creatinine 1.21, digoxin 0.3 Labs (10/18): K 4.7, creatinine 1.10, digoxin 0.3  PMH: 1. HTN 2. Hypothyroidism 3. Hyperlipidemia 4. Rheumatic heart disease: s/p MV repair and aortic valve repair in 2010, Dr. Roxy Manns. - TEE (2/18): EF 35-30%, dilated LV, rheumatic-appearing aortic valve s/p repair with moderate AS (AVA 1.1 cm^2), moderate AI.  MV s/p repair with mild-moderate  mitral stenosis and eccentric severe MR.   - Echo (8/18): Severe AI but only moderate AS, MR was only mild on this study (may have missed full jet) with mean MV gradient 4 mmHg.  5. Chronic systolic CHF: Nonischemic cardiomyopathy, probably related to valvular disease.   - Echo (11/15): EF 40-45%, - Echo (12/16): EF 35-40%. - Echo (1/18): EF 25-30%.  - LHC/RHC (2/18): Normal coronaries, mean RA 18, mean PA 74/39 mean 52, mean PCWP 31, CI 1.98 Fick. - CPX (3/18): peak VO2 11.7, VE/VCO2 slope 40, RER 1.11 => moderate to severely decreased functional capacity due to HF.  - RHC (4/18): mean RA 4, PA 44/17 mean 28, mean PCWP 15, CI 1.73, PVR 4 WU.  - Echo (8/18): EF 30-35%, severe AI with moderate AS but only mild MR noted with mean MV gradient 4 mmHg.  The RV was mildly dilated with mildly decreased systolic function.  Social History   Socioeconomic History  . Marital status: Single    Spouse name: Not on file  . Number of children: Not on file  . Years of education: Not on file  . Highest education level: Not on file  Social Needs  . Financial resource strain: Not on file  . Food insecurity - worry: Not on file  . Food insecurity - inability: Not on file  . Transportation needs - medical: Not on file  . Transportation needs - non-medical: Not on file  Occupational History  . Not on file  Tobacco Use  . Smoking  status: Former Research scientist (life sciences)  . Smokeless tobacco: Never Used  . Tobacco comment: quit 2011  Substance and Sexual Activity  . Alcohol use: No  . Drug use: No  . Sexual activity: Not Currently  Other Topics Concern  . Not on file  Social History Narrative   Unemployed, on disability.   Now is finally reestablished with a PCP.   She is a former smoker, quit in 2011. Denies alcohol consumption   Tries to walk routinely.   Family History  Problem Relation Age of Onset  . Heart disease Mother   . CVA Mother    Review of systems complete and found to be negative unless listed in  HPI.    Current Facility-Administered Medications  Medication Dose Route Frequency Provider Last Rate Last Dose  . 0.9 %  sodium chloride infusion   Intravenous Continuous Larey Dresser, MD 20 mL/hr at 06/28/17 1039    . 0.9 %  sodium chloride infusion   Intravenous Continuous Larey Dresser, MD       Vitals:   06/28/17 1001  BP: (!) 109/42  Pulse: 66  Resp: (!) 23  Temp: 97.7 F (36.5 C)  TempSrc: Oral  SpO2: 99%  Weight: 183 lb (83 kg)  Height: 4\' 9"  (1.448 m)    Wt Readings from Last 3 Encounters:  06/28/17 183 lb (83 kg)  05/11/17 188 lb (85.3 kg)  04/11/17 186 lb 12.8 oz (84.7 kg)   Physical Exam  General: NAD Neck: No JVD, no thyromegaly or thyroid nodule.  Lungs: Clear to auscultation bilaterally with normal respiratory effort. CV: Nondisplaced PMI.  Heart regular S1/S2, no S3/S4, 3/6 crescendo-decrescendo systolic murmur RUSB, 2/6 diastolic murmur.  No peripheral edema.  No carotid bruit.  Normal pedal pulses.  Abdomen: Soft, nontender, no hepatosplenomegaly, no distention.  Skin: Intact without lesions or rashes.  Neurologic: Alert and oriented x 3.  Psych: Normal affect. Extremities: No clubbing or cyanosis.  HEENT: Normal.   Assessment/Plan: 1. 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  She had aortic and mitral valve repairs in 2010.   It was felt that she would not be a very good Mitraclip candidate as she already has a degree of mitral stenosis.  TEE in 2/18 showed moderate AS/moderate AI , mild to moderate MS, severe eccentric MR.  Echo in 8/18 showed moderate AS/severe AI but only mild mitral stenosis and mild MR noted.  MR was eccentric on TEE so may not have been fully visualized on 8/18 TTE.  She has seen Dr. Roxy Manns.  To correct the situation, she would need aortic and mitral valve replacements.  This would be higher risk with low EF but not prohibitive. Hemodynamics looks better on 4/18 RHC though cardiac output still marginal.  Symptomatically improved  and still reluctant to undergo valve surgery.  - TEE today to reassess valves.   2. Chronic systolic CHF:  Nonischemic cardiomyopathy (no significant disease on 2/18 LHC).  It is possible that this cardiomyopathy is driven by her valvular disease. Echo (8/18) with EF 30-35% RV mildly dilated.  NYHA class II symptoms. Volume status stable on exam.  - Continue lasix 80 mg daily. BMET today.  - Continue Entresto 49/51 mg BID.  - Hypotensive with 1/2 tab Bidil tid, will leave off.  - Continue digoxin 0.125 mg daily. Check level today.    - Continue spironolactone 25 mg daily.  - If she does not have surgery will need to consider ICD.  Last echo showed IVCD but  QRS only 136 msec so CRT benefit would not likely be large unless QRS widens over time.  It is conceivable that EF could improve with valve surgery, precluding ICD.   3. CKD stage II-III: Creatinine down to 1.1 in 10/18. BMET today.    Loralie Champagne 06/28/2017

## 2017-06-28 NOTE — CV Procedure (Signed)
Procedure: TEE  Sedation: Versed 5 mg IV, Fentanyl 50 mcg IV  Findings: Please see echo section for full report.  Mildly dilated LV with mild LV hypertrophy.  EF 45%, diffuse hypokinesis.  Normal RV size with mildly decreased systolic function.  Mild left atrial enlargement, no LAA thrombus.  Normal right atrial size. No PFO or ASD by color doppler.  Trivial TR.  Status post mitral valve repair with mean gradient 5 mmHg and MVA by PHT of 1.8 cm^2, suggesting mild stenosis.  There was trivial mitral regurgitation.  The aortic valve was thickened and trileaflet (rheumatic-appearing). There was incomplete coaptation.  There appeared to be severe aortic insufficiency with mild to moderate stenosis (mean gradient 19 mmHg).  I did not see holodiastolic flow reversal in the descending thoracic aorta.   Impression:  1. EF improved to 45%.  2. 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  3. S/p mitral valve repair with no more than mild stenosis.   Will need to revisit aortic valve replacement with Dr. Roxy Manns.   [name] 06/28/2017 12:21 PM

## 2017-08-07 ENCOUNTER — Ambulatory Visit (HOSPITAL_COMMUNITY)
Admission: RE | Admit: 2017-08-07 | Discharge: 2017-08-07 | Disposition: A | Discharge status: Home / Self Care | Payer: Medicaid Other | Source: Ambulatory Visit | Attending: Cardiology | Admitting: Cardiology

## 2017-08-07 ENCOUNTER — Encounter (HOSPITAL_COMMUNITY): Payer: Self-pay | Admitting: Cardiology

## 2017-08-07 ENCOUNTER — Other Ambulatory Visit: Payer: Self-pay

## 2017-08-07 VITALS — BP 110/60 | HR 64 | Wt 193.5 lb

## 2017-08-07 DIAGNOSIS — I080 Rheumatic disorders of both mitral and aortic valves: Secondary | ICD-10-CM | POA: Diagnosis not present

## 2017-08-07 DIAGNOSIS — I130 Hypertensive heart and chronic kidney disease with heart failure and stage 1 through stage 4 chronic kidney disease, or unspecified chronic kidney disease: Secondary | ICD-10-CM | POA: Diagnosis not present

## 2017-08-07 DIAGNOSIS — I5022 Chronic systolic (congestive) heart failure: Secondary | ICD-10-CM | POA: Diagnosis not present

## 2017-08-07 DIAGNOSIS — I959 Hypotension, unspecified: Secondary | ICD-10-CM | POA: Diagnosis not present

## 2017-08-07 DIAGNOSIS — I429 Cardiomyopathy, unspecified: Secondary | ICD-10-CM | POA: Insufficient documentation

## 2017-08-07 DIAGNOSIS — Z9889 Other specified postprocedural states: Secondary | ICD-10-CM | POA: Diagnosis not present

## 2017-08-07 DIAGNOSIS — I5042 Chronic combined systolic (congestive) and diastolic (congestive) heart failure: Secondary | ICD-10-CM | POA: Diagnosis not present

## 2017-08-07 DIAGNOSIS — Z79899 Other long term (current) drug therapy: Secondary | ICD-10-CM | POA: Insufficient documentation

## 2017-08-07 DIAGNOSIS — I099 Rheumatic heart disease, unspecified: Secondary | ICD-10-CM

## 2017-08-07 LAB — BASIC METABOLIC PANEL
Anion gap: 10 (ref 5–15)
BUN: 28 mg/dL — ABNORMAL HIGH (ref 6–20)
CHLORIDE: 105 mmol/L (ref 101–111)
CO2: 24 mmol/L (ref 22–32)
Calcium: 9.1 mg/dL (ref 8.9–10.3)
Creatinine, Ser: 1.25 mg/dL — ABNORMAL HIGH (ref 0.44–1.00)
GFR calc non Af Amer: 47 mL/min — ABNORMAL LOW (ref >60)
GFR, EST AFRICAN AMERICAN: 55 mL/min — AB (ref >60)
Glucose, Bld: 95 mg/dL (ref 65–99)
POTASSIUM: 4.9 mmol/L (ref 3.5–5.1)
SODIUM: 139 mmol/L (ref 135–145)

## 2017-08-07 NOTE — Patient Instructions (Signed)
Stop Digoxin  Labs drawn today (if we do not call you, then your lab work was stable)   Keep appointment with Dr. Roxy Manns on 4/22/219 at 4 pm  Your physician recommends that you schedule a follow-up appointment in: 3 months with Dr. Aundra Dubin

## 2017-08-07 NOTE — Progress Notes (Signed)
Advanced Heart Failure Clinic Note   PCP: Roe Coombs Cardiology: Dr. Ellyn Hack HF Cardiology: Dr. 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  She had aortic and mitral repairs in 2010.  Afterwards, EF was mildly low in the 40-45% range.  However, EF had dropped to 25-30% on 1/18 echo.  TEE showed severe MR, moderate AI, moderate aortic stenosis.  RHC/LHC in 2/18 showed significantly elevated LV and RV filling pressures.    At her initial appointment here, she was very dyspneic with exertion, with orthopnea and PND. Her meds were adjusted and Lasix increased.  She has seen Dr. Roxy Manns since that time for evaluation for redo valve surgery.  She would require high risk mitral and aortic valve replacements. For now, the plan will be close monitoring with surgery if starts to decline or has CHF exacerbation. CPX was done in 3/18 showing moderate to severely decreased functional capacity.  RHC in 4/18 showed optimized filling pressures but low cardiac output.  Repeat echo in 8/18 showed EF 30-35%, severe AI with moderate AS but only mild MR noted.  The RV was mildly dilated with mildly decreased systolic function.   She returns today for HF follow up. Overall doing well. Able to walk with no SOB. No CP, orthopnea, or PND. Has dizziness at times with activity, not with sitting to standing. No presyncope. Weights trending up at home. She has trouble with thyroid and follows in [facility]. Compliant with meds, fluid, and salt limitation.   TEE 06/28/2017: EF 45%, severe AI, mild to moderate AS, mild mitral valve stenosis s/p MV repair  Labs (2/18): K 3.8 => 3.4, creatinine 1.09 => 1.07, hgb 12.7, BNP 1010 Labs (3/18): K 4.6, creatinine 2.43 => 1.74 Labs (4/18): K 4, creatinine 1.28, digoxin 0.3 Labs 09/2016: K 4.4, creatinine 1.26.  Labs 01/19/2017: K 4.4, Creatinine 1.21, digoxin 0.3 Labs (10/18): K 4.7, creatinine  1.10, digoxin 0.3 Labs (12/18): K 4.9, creatinine 1.34, digoxin < 0.2  PMH: 1. HTN 2. Hypothyroidism 3. Hyperlipidemia 4. Rheumatic heart disease: s/p MV repair and aortic valve repair in 2010, Dr. Roxy Manns. - TEE (2/18): EF 35-30%, dilated LV, rheumatic-appearing aortic valve s/p repair with moderate AS (AVA 1.1 cm^2), moderate AI.  MV s/p repair with mild-moderate mitral stenosis and eccentric severe MR.   - Echo (8/18): Severe AI but only moderate AS, MR was only mild on this study (may have missed full jet) with mean MV gradient 4 mmHg.  - TEE 06/28/2017: EF 45%, severe AI, mild to moderate AS, mild mitral valve stenosis s/p MV repair 5. Chronic systolic CHF: Nonischemic cardiomyopathy, probably related to valvular disease.   - Echo (11/15): EF 40-45%, - Echo (12/16): EF 35-40%. - Echo (1/18): EF 25-30%.  - LHC/RHC (2/18): Normal coronaries, mean RA 18, mean PA 74/39 mean 52, mean PCWP 31, CI 1.98 Fick. - CPX (3/18): peak VO2 11.7, VE/VCO2 slope 40, RER 1.11 => moderate to severely decreased functional capacity due to HF.  - RHC (4/18): mean RA 4, PA 44/17 mean 28, mean PCWP 15, CI 1.73, PVR 4 WU.  - Echo (8/18): EF 30-35%, severe AI with moderate AS but only mild MR noted with mean MV gradient 4 mmHg.  The RV was mildly dilated with mildly decreased systolic function. - TEE 3/87: EF 45%, severe aortic insufficiency, mild to moderate AS, mild mitral valve stenosis s/p MV repair  Social History   Socioeconomic History  . Marital status: Single    Spouse name: Not on file  . Number of children: Not on file  . Years of education: Not on file  . Highest education level: Not on file  Social Needs  . Financial resource strain: Not on file  . Food insecurity - worry: Not on file  . Food insecurity - inability: Not on file  . Transportation needs - medical: Not on file  . Transportation needs - non-medical: Not on file  Occupational History  . Not on file  Tobacco Use  . Smoking status:  Former Research scientist (life sciences)  . Smokeless tobacco: Never Used  . Tobacco comment: quit 2011  Substance and Sexual Activity  . Alcohol use: No  . Drug use: No  . Sexual activity: Not Currently  Other Topics Concern  . Not on file  Social History Narrative   Unemployed, on disability.   Now is finally reestablished with a PCP.   She is a former smoker, quit in 2011. Denies alcohol consumption   Tries to walk routinely.   Family History  Problem Relation Age of Onset  . Heart disease Mother   . CVA Mother    Review of systems complete and found to be negative unless listed in HPI.    Current Outpatient Medications  Medication Sig Dispense Refill  . acetaminophen (TYLENOL) 325 MG tablet Take 650 mg every 6 (six) hours as needed by mouth for mild pain.    [name] albuterol (PROVENTIL HFA;VENTOLIN HFA) 108 (90 BASE) MCG/ACT inhaler Inhale 2 puffs into the lungs every 6 (six) hours as needed for shortness of breath.     [name] aspirin 81 MG chewable tablet Chew 1 tablet (81 mg total) by mouth daily. 30 tablet 3  . beclomethasone (QVAR) 40 MCG/ACT inhaler Inhale 2 puffs into the lungs daily as needed (shortness of breath).     . carvedilol (COREG) 12.5 MG tablet Take 1 tablet (12.5 mg total) by mouth 2 (two) times daily with a meal. 60 tablet 3  . digoxin (LANOXIN) 0.125 MG tablet Take 0.5 tablets (0.0625 mg total) by mouth every other day. 15 tablet 3  . ENTRESTO 49-51 MG TAKE 1 TABLET BY MOUTH 2 TIMES DAILY 60 tablet 3  . furosemide (LASIX) 40 MG tablet Take 2 tablets (80 mg total) by mouth daily. 60 tablet 3  . levothyroxine (SYNTHROID, LEVOTHROID) 125 MCG tablet Take 125 mcg daily by mouth.  0  . potassium chloride (K-DUR) 10 MEQ tablet Take 2 tablets (20 mEq total) by mouth daily. 60 tablet 3  . ranitidine (ZANTAC) 150 MG tablet Take 150 mg by mouth 2 (two) times daily.  0  . rosuvastatin (CRESTOR) 20 MG tablet Take 1 tablet (20 mg total) by mouth daily. 90 tablet 3  . spironolactone (ALDACTONE) 25 MG tablet  take 1 tablet by mouth once daily 30 tablet 3   No current facility-administered medications for this encounter.    Vitals:   08/07/17 1340  BP: 110/60  Pulse: 64  SpO2: 100%  Weight: 193 lb 8 oz (87.8 kg)    Wt Readings from Last 3 Encounters:  08/07/17 193 lb 8 oz (87.8 kg)  06/28/17 183 lb (83 kg)  05/11/17 188 lb (85.3 kg)   Physical Exam  General: No resp difficulty. HEENT: Normal Neck: Supple. No JVD. Carotids 2+ bilat; no bruits. No thyromegaly or nodule noted. Cor: PMI nondisplaced. RRR, 2/6 crescendo-decrescendo systolic murmur RUSB, 2/6 diastolic murmur  along the sternal border.  Lungs: CTAB, normal effort. Abdomen: Soft, non-tender, non-distended, no HSM. No bruits or masses. +BS  Extremities: No cyanosis, clubbing, or rash. R and LLE no edema.  Neuro: Alert & orientedx3, cranial nerves grossly intact. moves all 4 extremities w/o difficulty. Affect pleasant  Assessment/Plan: 1. 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  She had aortic and mitral valve repairs in 2010.   It was felt that she would not be a very good Mitraclip candidate as she already has a degree of mitral stenosis.  TEE in 2/18 showed moderate AS/moderate AI , mild to moderate MS, severe eccentric MR.  Echo in 8/18 showed moderate AS/severe AI but only mild mitral stenosis and mild MR noted. Hemodynamics looked better on 4/18 RHC though cardiac output still marginal.  Symptomatically improved and still reluctant to undergo valve surgery.  - Repeat TEE 06/28/2017: EF 45%, severe aortic insufficiency, mild to moderate AS, mild mitral valve stenosis s/p MV repair with trivial MR.  2. Chronic systolic CHF:  Nonischemic cardiomyopathy (no significant disease on 2/18 LHC).  It is possible that this cardiomyopathy is driven by her valvular disease. Echo (8/18) with EF 30-35% RV mildly dilated. TEE 06/28/2017: EF 45%, severe aortic insufficiency, mild to moderate AS, mild mitral valve stenosis s/p MV repair with only  trivial MR.  - NYHA class II symptoms. Volume stable on exam - Continue lasix 80 mg daily + 20 meq K. BMET today. Last K was 4.9 - Continue Entresto 49/51 mg BID.  - Hypotensive with 1/2 tab Bidil tid, will leave off.  - DC digoxin with EF up to 45%.  - Continue spironolactone 25 mg daily.  - Not an ICD candidate with EF improvement to 45% 3. CKD stage II-III:  - Creatinine 1.34 12/18. BMET today 4. Weight gain - 10 lb weight gain since last appointment. She follows up with endocrinologist later this month for thyroid ultrasound.   BMET today  Georgiana Shore, NP 08/07/2017  Patient seen with NP, agree with the above note.  TEE in 2/19 showed mild stenosis of the repaired mitral valve with only trivial mitral regurgitation.  However, there was severe aortic insufficiency with mild to moderate stenosis of the repaired aortic valve.  Her EF is up to 45%.  NYHA class II symptoms.   On exam today, she does not appear volume overloaded.   With improvement in EF to 45%, she can stop digoxin. Continue other cardiac meds.  BMET today.   She is going to need replacement of her aortic valve with severe AI, mild to moderate AS of repaired valve. She has had a prior sternotomy so TAVR may be a consideration but given her young age, open surgery with aortic valve replacement may be preferable.  - I will make an appointment for her to see Dr. Roxy Manns soon.  I think she is stable for an aortic valve operation.   Loralie Champagne 08/08/2017

## 2017-08-10 ENCOUNTER — Other Ambulatory Visit (HOSPITAL_COMMUNITY): Payer: Self-pay | Admitting: *Deleted

## 2017-08-10 MED ORDER — SACUBITRIL-VALSARTAN 49-51 MG PO TABS: 1.0000 | ORAL_TABLET | Freq: Two times a day (BID) | ORAL | 3 refills | Status: DC

## 2017-08-11 ENCOUNTER — Encounter: Payer: Medicaid Other | Admitting: Thoracic Surgery (Cardiothoracic Vascular Surgery)

## 2017-08-11 ENCOUNTER — Telehealth (HOSPITAL_COMMUNITY): Payer: Self-pay | Admitting: *Deleted

## 2017-08-11 NOTE — Telephone Encounter (Signed)
Patient called stating her pharmacy said she needed a PA before she could get entresto. Patient has medicaid. Delene Loll was covered though medicaid until 07/18/17. I told patient I would call her back after I followed up with our clinic PharmD Doroteo Bradford.   Message routed to [facility]

## 2017-08-11 NOTE — Telephone Encounter (Signed)
Left VM notifying patient that medication had been approved through her insurance and she should not have a problem picking up medication at her pharmacy.

## 2017-08-11 NOTE — Telephone Encounter (Signed)
Entresto PA approved by [facility] Medicaid 08/10/17 - 08/05/18.

## 2017-08-28 ENCOUNTER — Other Ambulatory Visit: Payer: Self-pay

## 2017-08-28 ENCOUNTER — Ambulatory Visit: Payer: Medicaid Other | Admitting: Thoracic Surgery (Cardiothoracic Vascular Surgery)

## 2017-08-28 ENCOUNTER — Other Ambulatory Visit: Payer: Self-pay | Admitting: *Deleted

## 2017-08-28 ENCOUNTER — Encounter: Payer: Self-pay | Admitting: Thoracic Surgery (Cardiothoracic Vascular Surgery)

## 2017-08-28 VITALS — BP 110/60 | HR 48 | Resp 18 | Ht <= 58 in | Wt 189.4 lb

## 2017-08-28 DIAGNOSIS — I351 Nonrheumatic aortic (valve) insufficiency: Secondary | ICD-10-CM

## 2017-08-28 DIAGNOSIS — Z9889 Other specified postprocedural states: Secondary | ICD-10-CM

## 2017-08-28 DIAGNOSIS — I062 Rheumatic aortic stenosis with insufficiency: Secondary | ICD-10-CM | POA: Diagnosis not present

## 2017-08-28 DIAGNOSIS — Z01818 Encounter for other preprocedural examination: Secondary | ICD-10-CM

## 2017-08-28 DIAGNOSIS — I350 Nonrheumatic aortic (valve) stenosis: Secondary | ICD-10-CM

## 2017-08-28 NOTE — Patient Instructions (Signed)
Continue all previous medications without any changes at this time  

## 2017-08-28 NOTE — Progress Notes (Signed)
[name]  CARDIOTHORACIC SURGERY CONSULTATION REPORT  Referring Provider is Larey Dresser, MD  Primary Cardiologist is Leonie Man, MD PCP is Aura Dials, PA-C  Chief Complaint  Patient presents with  . Aortic Insuffiency    Surgical eval, TEE 06/28/17, CTA C/A/P 08/23/17, HX of Aortic and Mitral valvel repair  . 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   Patient has a long-standing history of nonischemic cardiomyopathy, hypertension, and congestive heart failure. For years she was followed with what was felt to be Takotsubo cardiomyopathy despite the fact that multiple echocardiograms revealed the presence of severe mitral regurgitation. In 2010 she underwent a thorough cardiac workup and was found to have moderate aortic insufficiency and severe mitral regurgitation with moderate LV systolic dysfunction. She underwent aortic valve repair (suture plication of the three commissures) and mitral ring annuloplasty.  Findings at the time of surgery were consistent with rheumatic valvular heart disease. Valve replacement was avoided because at that time the patient had been having problems with recurrent postmenopausal uterine bleeding. She did well postoperatively and was followed regularly by Dr. Ellyn Hack until 2017 when she began to develop symptoms of worsening shortness of breath.  Transthoracic and transesophageal echocardiograms revealed severe left ventricular systolic dysfunction with moderate aortic stenosis and moderate aortic insufficiency.  There was severe mitral  regurgitation.  She admits that she was not always compliant with medications for chronic diastolic congestive heart failure at the time.  The patient was referred to the advanced heart failure clinic where her medications were adjusted, and she has been followed regularly ever since by Dr. Aundra Dubin.  She was last seen in our office on September 19, 2016 at which time she was doing remarkably well.  Follow-up echocardiogram performed in August 2018 revealed improved left ventricular systolic function with ejection fraction up to 30-35%.  There remained severe aortic insufficiency with moderate aortic stenosis but only mild mitral regurgitation.  The right ventricle was only mildly dilated.  She was seen in follow-up recently by Dr. Aundra Dubin and  follow-up transesophageal echocardiogram performed June 28, 2017 revealed further improvement in left ventricular systolic function with ejection fraction estimated 45%.  There remained severe aortic insufficiency with only mild to moderate aortic stenosis.  There was no significant residual mitral regurgitation.  Mean transvalvular gradient across the mitral valve was estimated only 5 mmHg, consistent with mitral valve area estimated 1.8 cm by pressure half-time.  The patient was referred for follow-up surgical consultation to discuss possible aortic valve replacement.  The patient is single and lives locally in the [place] with her daughter.  She reports that she has been doing fairly well.  She specifically denies any significant symptoms of exertional shortness of breath.  She states that she only gets short of breath with more strenuous exertion.  She can walk reasonable distances without having to stop.  She denies any PND, orthopnea, or lower extremity edema.  Her weight has been stable on Lasix 80 mg twice daily.  She denies any exertional chest pain or chest tightness.  She still experiences occasional discomfort in her surgical scar related to her keloids.  This  is  oftentimes affected by the weather.  Past Medical History:  Diagnosis Date  . Chronic combined systolic and diastolic CHF (congestive heart failure) (HCC)    EF now down to 25-30%. LVEDP on cath was 30 mmHg with wedge pressure of 31 mmHg.  [name] Dementia    Early onset  . Dysthymia   . Essential hypertension 12/16/2013  . History of GI bleeding   . History of uterine fibroid   . Hx of thyroid cancer 12/16/2013   Status post thyroidectomy  . Hyperlipidemia with target LDL less than 100 12/16/2013  . Hypothyroidism 12/16/2013  . 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 Morbid obesity ([place])   . Post-menopausal bleeding 12/05/2011  . Pulmonary hypertension ([place]) 05/2016   By cardiac catheterization: mean RA 18, RV pressure/EDP: 70/12/18 mmHg.  mean PA 74/39 mean 52, mean PCWP 31 (with V wave of 45 mmHg), LVEDP ~ 30 mmHg.  [name] RAD (reactive airway disease)   . 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 Roxy Manns  Echo 12/2010: EF 40-45%, inferior hypokinesis; Slow progression of valvular Dz & drop in EF --> 05/2016: EF 25-30% (down from 40-45% post-op) with Mod AS/AI, mild MS & Severe MR (Cath & TEE 06/2016)   . S/P aortic valve repair 22/06/5425   suture plication of 3 commissures  . S/P MVR (mitral valve repair) 12/23/2008   26 mm Sorin MEMO 3D Ring Annuloplasty  . 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 Progressive worsening of aortic and mitral valve disease.    Past Surgical History:  Procedure Laterality Date  . AORTIC VALVE REPAIR  August 0623   Suture plication of all 3 commissures  . CARDIAC CATHETERIZATION  August 2010   Preop: nonobstructive coronary disease  . CARDIAC SURGERY  12/2008   Aortic & Mitral Valve Repair (Dr. Roxy Manns)  . CESAREAN SECTION    . MITRAL VALVE REPAIR  August 2010    26 mm Sorin [facility] 3D Ring Annuloplasty  .  RIGHT HEART CATH N/A 08/30/2016   Procedure: Right Heart Cath;  Surgeon: Larey Dresser, MD;  Location: [place] [facility];  Service: Cardiovascular;  Laterality: N/A;  . RIGHT/LEFT HEART CATH AND CORONARY ANGIOGRAPHY N/A 07/08/2016   Procedure: Right/Left Heart Cath and Coronary Angiography;  Surgeon: Leonie Man, MD;  Location: [place] [facility];  Service: Cardiovascular: Normal coronaries, mean RA 18, RV pressure/EDP: 70/12/18 mmHg.  mean PA 74/39 mean 52, mean PCWP 31 (with V wave of 45 mmHg), LVEDP ~ 30 mmHg.  CI 1.98 Fick. Peak AoV gradient ~15 mHg    . TEE WITHOUT CARDIOVERSION N/A 07/13/2016   Procedure: Transesophageal Echocardiogram (TEE);  Surgeon: [name], MD;  Location: [facility] ENDOSCOPY;  Service: Cardiovascular:  EF 25-30%, dilated LV - diffuse hypokinesis, rheumatic-appearing Aortic Vallve s/p repair with moderate AS (AVA 1.1 cm^2), moderate central AI.  MV s/p repeat with mild mitral stenosis and eccentric severe MR.    . TEE WITHOUT CARDIOVERSION    . TEE WITHOUT CARDIOVERSION N/A 06/28/2017   Procedure: TRANSESOPHAGEAL ECHOCARDIOGRAM (TEE);  Surgeon: Larey Dresser, MD;  Location: [facility] ENDOSCOPY;  Service: Cardiovascular;  Laterality: N/A;  . THYROID SURGERY    . TRANSTHORACIC ECHOCARDIOGRAM  05/2016   EF 25-30%. Moderate aortic stenosis with moderate regurgitation. Also potential aortic stenosis with likely worse than expected regurgitation  .  TRANSTHORACIC ECHOCARDIOGRAM  May 2012    EF 40-45%, inferior hypokinesis. Slightly increased transaortic velocity = mild AS with mild to moderate AI normal MV gradients with no MR. Significantly improved PA pressures. Paradoxical septal motion.    Family History  Problem Relation Age of Onset  . Heart disease Mother   . CVA Mother     Social History   Socioeconomic History  . Marital status: Single    Spouse name: Not on file  . Number of children: Not on file  . Years of education: Not on file  . Highest education level:  Not on file  Occupational History  . Not on file  Social Needs  . Financial resource strain: Not on file  . Food insecurity:    Worry: Not on file    Inability: Not on file  . Transportation needs:    Medical: Not on file    Non-medical: Not on file  Tobacco Use  . Smoking status: Former Research scientist (life sciences)  . Smokeless tobacco: Never Used  . Tobacco comment: quit 2011  Substance and Sexual Activity  . Alcohol use: No  . Drug use: No  . Sexual activity: Not Currently  Lifestyle  . Physical activity:    Days per week: Not on file    Minutes per session: Not on file  . Stress: Not on file  Relationships  . Social connections:    Talks on phone: Not on file    Gets together: Not on file    Attends religious service: Not on file    Active member of club or organization: Not on file    Attends meetings of clubs or organizations: Not on file    Relationship status: Not on file  . Intimate partner violence:    Fear of current or ex partner: Not on file    Emotionally abused: Not on file    Physically abused: Not on file    Forced sexual activity: Not on file  Other Topics Concern  . Not on file  Social History Narrative   Unemployed, on disability.   Now is finally reestablished with a PCP.   She is a former smoker, quit in 2011. Denies alcohol consumption   Tries to walk routinely.    Current Outpatient Medications  Medication Sig Dispense Refill  . acetaminophen (TYLENOL) 325 MG tablet Take 650 mg every 6 (six) hours as needed by mouth for mild pain.    [name] albuterol (PROVENTIL HFA;VENTOLIN HFA) 108 (90 BASE) MCG/ACT inhaler Inhale 2 puffs into the lungs every 6 (six) hours as needed for shortness of breath.     [name] aspirin 81 MG chewable tablet Chew 1 tablet (81 mg total) by mouth daily. 30 tablet 3  . beclomethasone (QVAR) 40 MCG/ACT inhaler Inhale 2 puffs into the lungs daily as needed (shortness of breath).     . carvedilol (COREG) 12.5 MG tablet Take 1 tablet (12.5 mg total) by  mouth 2 (two) times daily with a meal. 60 tablet 3  . furosemide (LASIX) 40 MG tablet Take 2 tablets (80 mg total) by mouth daily. 60 tablet 3  . levothyroxine (SYNTHROID, LEVOTHROID) 125 MCG tablet Take 125 mcg daily by mouth.  0  . potassium chloride (K-DUR) 10 MEQ tablet Take 2 tablets (20 mEq total) by mouth daily. 60 tablet 3  . ranitidine (ZANTAC) 150 MG tablet Take 150 mg by mouth 2 (two) times daily.  0  . rosuvastatin (CRESTOR) 20 MG tablet Take 1 tablet (20  mg total) by mouth daily. 90 tablet 3  . sacubitril-valsartan (ENTRESTO) 49-51 MG Take 1 tablet by mouth 2 (two) times daily. 60 tablet 3  . spironolactone (ALDACTONE) 25 MG tablet take 1 tablet by mouth once daily 30 tablet 3   No current facility-administered medications for this visit.     Allergies  Allergen Reactions  . Morphine And Related Other (See Comments)    Hallucinations       Review of Systems:   General:  normal appetite, stable energy, no weight gain, no weight loss, no fever  Cardiac:  no chest pain with exertion, no chest pain at rest, + SOB with more strenuous exertion, no resting SOB, no PND, no orthopnea, no palpitations, no arrhythmia, no atrial fibrillation, no LE edema, no dizzy spells, no syncope  Respiratory:  no shortness of breath, no home oxygen, no productive cough, no dry cough, no bronchitis, no wheezing, no hemoptysis, no asthma, no pain with inspiration or cough, no sleep apnea, no CPAP at night  GI:   no difficulty swallowing, no reflux, no frequent heartburn, no hiatal hernia, no abdominal pain, no constipation, no diarrhea, no hematochezia, no hematemesis, no melena  GU:   no dysuria,  no frequency, no urinary tract infection, no hematuria, no kidney stones, no kidney disease  Vascular:  no pain suggestive of claudication, no pain in feet, no leg cramps, no varicose veins, no DVT, no non-healing foot ulcer  Neuro:   no stroke, no TIA's, no seizures, no headaches, no temporary blindness one  eye,  no slurred speech, no peripheral neuropathy, no chronic pain, no instability of gait, no memory/cognitive dysfunction  Musculoskeletal: no arthritis, no joint swelling, no myalgias, no difficulty walking, normal mobility   Skin:   no rash, no itching, no skin infections, no pressure sores or ulcerations  Psych:   no anxiety, no depression, no nervousness, no unusual recent stress  Eyes:   no blurry vision, no floaters, no recent vision changes, does not wears glasses or contacts  ENT:   no hearing loss, no loose or painful teeth, edentulous   Hematologic:  no easy bruising, no abnormal bleeding, no clotting disorder, no frequent epistaxis  Endocrine:  no diabetes, does not check CBG's at home           Physical Exam:   BP 110/60 (BP Location: Right Arm, Patient Position: Sitting, Cuff Size: Normal)   Pulse (!) 48   Resp 18   Ht 4' 9"  (1.448 m)   Wt 189 lb 6.4 oz (85.9 kg)   LMP 03/06/2012   SpO2 98% Comment: RA  BMI 40.99 kg/m   General:  Morbidly obese,  well-appearing  HEENT:  Unremarkable   Neck:   no JVD, no bruits, no adenopathy   Chest:   clear to auscultation, symmetrical breath sounds, no wheezes, no rhonchi   CV:   RRR, no murmur appreciated but heart sounds distant  Abdomen:  soft, non-tender, no masses   Extremities:  warm, well-perfused, pulses diminished, no LE edema  Rectal/GU  Deferred  Neuro:   Grossly non-focal and symmetrical throughout  Skin:   Clean and dry, no rashes, no breakdown   Diagnostic Tests:  Transthoracic Echocardiography  Patient:    [name], [name] MR #:       [id number] Study Date: 06/15/2016 Gender:     F Age:        75 Height:     144.8 cm Weight:     82.9  kg BSA:        1.88 m^2 Pt. Status: Room:   SONOGRAPHER  Cindy Hazy, RDCS  ATTENDING    Glenetta Hew, MD  ORDERING     Glenetta Hew, MD  [place], MD  PERFORMING   [facility],  Outpatient  cc:  ------------------------------------------------------------------- LV EF: 25% -   30%  ------------------------------------------------------------------- Indications:      I35.9 Aortic Valve Disorder.  I05.9 Mitral Valve Disorder.  ------------------------------------------------------------------- History:   PMH:  Acquired from the patient and from the patient&'s chart.  PMH:  Chronic Systolic Heart Failure. Takotsubo Cardiomyopathy. Thyroid Cancer.  ------------------------------------------------------------------- Study Conclusions  - Left ventricle: The cavity size was mildly dilated. Wall   thickness was normal. Diffuse hypokinesis with regional   variation. Inferolateral, inferior, and inferoseptal severe   hypokinesis. Indeterminant diastolic function. Systolic function   was severely reduced. The estimated ejection fraction was in the   range of 25% to 30%. - Aortic valve: Status post aortic valve repair. There was moderate   stenosis. There was moderate regurgitation. Mean gradient (S): 33   mm Hg. - Mitral valve: Status post mitral valve repair. Elevated mean   gradient across the mitral valve but pressure half-time not   prolonged, so probably not significant stenosis. There was mild   regurgitation (cannot rule out more significant mitral   regurgitation concealed by shadowing from the annuloplasty ring   and raising the mean gradient across the valve due to high flow).   Mean gradient (D): 8 mm Hg. Valve area by pressure half-time:   2.56 cm^2. - Left atrium: The atrium was moderately dilated. - Right ventricle: The cavity size was normal. Systolic function   was moderately reduced. - Right atrium: The atrium was mildly dilated. - Tricuspid valve: Peak RV-RA gradient (S): 45 mm Hg. - Pulmonary arteries: PA peak pressure: 48 mm Hg (S). - Inferior vena cava: The vessel was normal in size. The   respirophasic diameter changes were in the  normal range (= 50%),   consistent with normal central venous pressure.  Impressions:  - Mildly dilated LV with wall motion abnormalities as noted above.   EF 25-30%. Normal RV size with moderately decreased systolic   function. Status post aortic valve repair with moderate   regurgitation and moderate stenosis. Status post mitral valve   repair with elevated mean gradient but normal pressure half-time.   It is possible that there is significant mitral regurgitation   that is poorly visualized due to shadowing of the valve (and   raising the mean gradient across the valve due to high flow).   Mild to moderate pulmonary hypertension.  ------------------------------------------------------------------- Labs, prior tests, procedures, and surgery: Status post Mitral Valve repair 71m Sorin MEMO 3D Ring Annuloplasty. Status post Aortic Valve Repair.  ------------------------------------------------------------------- Study data:   Study status:  Routine.  Procedure:  The patient reported no pain pre or post test. Transthoracic echocardiography for left ventricular function evaluation, for right ventricular function evaluation, and for assessment of valvular function. Image quality was adequate.  Study completion:  There were no complications.          Transthoracic echocardiography.  M-mode, complete 2D, spectral Doppler, and color Doppler.  Birthdate: Patient birthdate: 006-21-1962  Age:  Patient is 57yr old.  Sex: Gender: female.    BMI: 39.5 kg/m^2.  Blood pressure:     124/62 Patient status:  Outpatient.  Study date:  Study date: 06/15/2016. Study time: 02:25 PM.  Location:  [facility]  -------------------------------------------------------------------  ------------------------------------------------------------------- Left ventricle:  The cavity size was mildly dilated. Wall thickness was normal. Diffuse hypokinesis with regional variation. Inferolateral,  inferior, and inferoseptal severe hypokinesis. Indeterminant diastolic function. Systolic function was severely reduced. The estimated ejection fraction was in the range of 25% to 30%.  ------------------------------------------------------------------- Aortic valve:  Status post aortic valve repair.  Trileaflet; mildly calcified leaflets.  Doppler:   There was moderate stenosis. There was moderate regurgitation.    VTI ratio of LVOT to aortic valve: 0.23. Valve area (VTI): 0.79 cm^2. Indexed valve area (VTI): 0.42 cm^2/m^2. Peak velocity ratio of LVOT to aortic valve: 0.2. Valve area (Vmax): 0.68 cm^2. Indexed valve area (Vmax): 0.36 cm^2/m^2. Mean velocity ratio of LVOT to aortic valve: 0.22. Valve area (Vmean): 0.76 cm^2. Indexed valve area (Vmean): 0.41 cm^2/m^2.    Mean gradient (S): 33 mm Hg. Peak gradient (S): 56 mm Hg.  ------------------------------------------------------------------- Aorta:  Aortic root: The aortic root was normal in size. Ascending aorta: The ascending aorta was normal in size.  ------------------------------------------------------------------- Mitral valve:  Status post mitral valve repair. Elevated mean gradient across the mitral valve but pressure half-time not prolonged, so probably not significant stenosis.  Doppler:  There was mild regurgitation (cannot rule out more significant mitral regurgitation concealed by shadowing from the annuloplasty ring and raising the mean gradient across the valve due to high flow). Valve area by pressure half-time: 2.56 cm^2. Indexed valve area by pressure half-time: 1.36 cm^2/m^2. Valve area by continuity equation (using LVOT flow): 1.15 cm^2. Indexed valve area by continuity equation (using LVOT flow): 0.61 cm^2/m^2.    Mean gradient (D): 8 mm Hg. Peak gradient (D): 13 mm Hg.  ------------------------------------------------------------------- Left atrium:  The atrium was moderately  dilated.  ------------------------------------------------------------------- Right ventricle:  The cavity size was normal. Systolic function was moderately reduced.  ------------------------------------------------------------------- Pulmonic valve:    Structurally normal valve.   Cusp separation was normal.  Doppler:  Transvalvular velocity was within the normal range. There was no regurgitation.  ------------------------------------------------------------------- Tricuspid valve:   Doppler:  There was trivial regurgitation.   ------------------------------------------------------------------- Right atrium:  The atrium was mildly dilated.  ------------------------------------------------------------------- Pericardium:  There was no pericardial effusion.  ------------------------------------------------------------------- Systemic veins: Inferior vena cava: The vessel was normal in size. The respirophasic diameter changes were in the normal range (= 50%), consistent with normal central venous pressure.  ------------------------------------------------------------------- Measurements   Left ventricle                            Value          Reference  LV ID, ED, PLAX chordal           (H)     62.2  mm       43 - 52  LV ID, ES, PLAX chordal           (H)     49.2  mm       23 - 38  LV fx shortening, PLAX chordal    (L)     21    %        >=29  LV PW thickness, ED                       12.1  mm       ---------  IVS/LV PW ratio, ED  0.75           <=1.3  Stroke volume, 2D                         52    ml       ---------  Stroke volume/bsa, 2D                     28    ml/m^2   ---------  LV e&', lateral                            4.65  cm/s     ---------  LV E/e&', lateral                          38.06          ---------  LV e&', medial                             6.49  cm/s     ---------  LV E/e&', medial                           27.27           ---------  LV e&', average                            5.57  cm/s     ---------  LV E/e&', average                          31.78          ---------    Ventricular septum                        Value          Reference  IVS thickness, ED                         9.02  mm       ---------    LVOT                                      Value          Reference  LVOT ID, S                                21    mm       ---------  LVOT area                                 3.46  cm^2     ---------  LVOT ID                                   21    mm       ---------  LVOT peak velocity, S  73.3  cm/s     ---------  LVOT mean velocity, S                     56.5  cm/s     ---------  LVOT VTI, S                               15.1  cm       ---------  LVOT peak gradient, S                     2     mm Hg    ---------  Stroke volume (SV), LVOT DP               52.3  ml       ---------  Stroke index (SV/bsa), LVOT DP            27.9  ml/m^2   ---------    Aortic valve                              Value          Reference  Aortic valve peak velocity, S             374   cm/s     ---------  Aortic valve mean velocity, S             257   cm/s     ---------  Aortic valve VTI, S                       66.5  cm       ---------  Aortic mean gradient, S                   30    mm Hg    ---------  Aortic peak gradient, S                   56    mm Hg    ---------  VTI ratio, LVOT/AV                        0.23           ---------  Aortic valve area, VTI                    0.79  cm^2     ---------  Aortic valve area/bsa, VTI                0.42  cm^2/m^2 ---------  Velocity ratio, peak, LVOT/AV             0.2            ---------  Aortic valve area, peak velocity          0.68  cm^2     ---------  Aortic valve area/bsa, peak               0.36  cm^2/m^2 ---------  velocity  Velocity ratio, mean, LVOT/AV             0.22           ---------  Aortic valve area, mean velocity          0.76  cm^2      ---------  Aortic valve area/bsa, mean               0.41  cm^2/m^2 ---------  velocity  Aortic regurg pressure half-time          266   ms       ---------    Aorta                                     Value          Reference  Aortic root ID, ED                        30    mm       ---------  Ascending aorta ID, A-P, S                36    mm       ---------    Left atrium                               Value          Reference  LA ID, A-P, ES                            53    mm       ---------  LA ID/bsa, A-P                    (H)     2.82  cm/m^2   <=2.2  LA volume, S                              76    ml       ---------  LA volume/bsa, S                          40.5  ml/m^2   ---------  LA volume, ES, 1-p A4C                    67    ml       ---------  LA volume/bsa, ES, 1-p A4C                35.7  ml/m^2   ---------  LA volume, ES, 1-p A2C                    76    ml       ---------  LA volume/bsa, ES, 1-p A2C                40.5  ml/m^2   ---------    Mitral valve                              Value          Reference  Mitral E-wave peak velocity               177   cm/s     ---------  Mitral A-wave peak velocity               139  cm/s     ---------  Mitral mean velocity, D                   131   cm/s     ---------  Mitral deceleration time          (H)     232   ms       150 - 230  Mitral pressure half-time                 74    ms       ---------  Mitral mean gradient, D                   8     mm Hg    ---------  Mitral peak gradient, D                   13    mm Hg    ---------  Mitral E/A ratio, peak                    1.3            ---------  Mitral valve area, PHT, DP                2.56  cm^2     ---------  Mitral valve area/bsa, PHT, DP            1.36  cm^2/m^2 ---------  Mitral valve area, LVOT                   1.15  cm^2     ---------  continuity  Mitral valve area/bsa, LVOT               0.61  cm^2/m^2 ---------  continuity  Mitral annulus VTI, D                      45.3  cm       ---------  Mitral regurg VTI, PISA                   174   cm       ---------    Pulmonary arteries                        Value          Reference  PA pressure, S, DP                (H)     48    mm Hg    <=30    Tricuspid valve                           Value          Reference  Tricuspid regurg peak velocity            337   cm/s     ---------  Tricuspid peak RV-RA gradient             45    mm Hg    ---------    Right ventricle                           Value          Reference  RV s&', lateral, S  8.33  cm/s     ---------  Legend: (L)  and  (H)  mark values outside specified reference range.  ------------------------------------------------------------------- Prepared and Electronically Authenticated by  Loralie Champagne, M.D. 2018-01-24T22:00:51    Right/Left Heart Cath and Coronary Angiography  Conclusion     Hemodynamic findings consistent with severe (secondary /combined) pulmonary hypertension (LVEDP & PCWP ~ 30 mmHg) and mitral valve regurgitation.  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 Slight transpulmonary gradient is still 21 indicating a partial component of primary pulmonary hypertension.  Plan for now would be to discuss with heart failure service for recommendations.   Plan: Increase Lasix dose to 40 twice a day with Zaroxolyn used for when necessary In the outpatient setting, we will convert from losartan to [facility] She will be discharged today. She has TEE scheduled for next week which will help clarify the extent of valvular disease.   Glenetta Hew, M.D., M.S. Interventional Cardiologist   Pager # [phone] Phone # [phone] [address] [place], [place] [address]    Indications   Valvular cardiomyopathy ([place]) [I42.8  (ICD-10-CM)]  Chronic combined systolic and diastolic CHF, NYHA class 3 (HCC) [I50.42 (ICD-10-CM)]  Angina, class II ([place]) [I20.9 (ICD-10-CM)]  Procedural Details/Technique   Technical Details PCP: Aura Dials, PA-C CARDIOLOGIST: Glenetta Hew, MD.   57 year old woman with a history of mitral and aortic valve repair in 2015 by Dr. Roxy Manns. She had severe mitral regurgitation as well as aortic regurgitation. Her initial postop echocardiogram showed notably improved LVEF, however over the last 2 echocardiograms her EF is steadily dropped. Most recent one showed EF of roughly 25-30% with worsening valvular disease. She has become increasingly dyspneic with worsening heart failure symptoms and even symptoms concerning for possible angina. She is therefore referred for right and left heart catheterization followed by TEE.  Time Out: Verified patient identification, verified procedure, site/side was marked, verified correct patient position, special equipment/implants available, medications/allergies/relevent history reviewed, required imaging and test results available. Performed.  Access:  Attempted Radial Artery: 6 Fr sheath -- Seldinger technique using Micropuncture Kit with ultrasound guidance was was unsuccessful due to inability to advance wire through the needle. Radial arterial access was abandoned for femoral * RIGHT Common Femoral Artery: 5 Fr Sheath - fluoroscopically guided modified Seldinger Technique  *Right Brachial/Antecubital Vein: The existing 18-gauge IV was exchanged over a wire for a 5Fr Short sheath  Right Heart Catheterization: 5 Fr Gordy Councilman catheter advanced under fluoroscopy with balloon inflated to the RA, RV, then PCWP-PA for hemodynamic measurement. The 0.25 Glidewire was used to help advance the catheter into the right atrium and then also into the pulmonary artery. Simultaneous FA & PA blood gases checked for SaO2% to calculate FICK CO/CI  Catheter removed completely out  of the body with balloon deflated.  Left Heart Catheterization: 5 Fr Catheters advanced or exchanged over a J-wire under direct fluoroscopic guidance into the ascending aorta; JR4 catheter advanced first.  LV Hemodynamics (LV Gram): JR4 catheter (was unable to cross with Angled Pigtail Catheter) Right Coronary Artery Cineangiography: JR4 Catheter  Left Coronary Artery Cineangiography: JL4 Catheter    After angiography was completed, the catheter was removed completely out of the body over wire without complication. The pulmonary arterial catheter was also removed completely out of the body. No palpitations.  Femoral Arterial and Brachial Venous Sheath(s) removed in the PACU holding area with manual pressure for  hemostasis.    MEDICATIONS * SQ Lidocaine 9m for tendon radial, 1 mL for brachial, 15 mL for femoral access * Isovue Contrast: 65 mL   Estimated blood loss <50 mL.  During this procedure the patient was administered the following to achieve and maintain moderate conscious sedation: Versed 1 mg, Morphine 25 mg, while the patient's heart rate, blood pressure, and oxygen saturation were continuously monitored. The period of conscious sedation was 69 minutes, of which I was present face-to-face 100% of this time.  Complications   Complications documented before study signed (07/08/2016 12:24 PM EST)    No complications were associated with this study.  Documented by HLeonie Man MD - 07/08/2016 12:16 PM EST    Coronary Findings   Diagnostic  Dominance: Right  Left Main  Vessel is large. Vessel is angiographically normal.  Left Anterior Descending  Vessel is angiographically normal. Tapers to a small caliber vessel as it wraps the apex  First Diagonal Branch  Vessel is small in size. Vessel is angiographically normal.  First Septal Branch  Vessel is small in size.  Second Diagonal Branch  Vessel is moderate in size. Vessel is angiographically normal.  Second Septal  Branch  Vessel is small in size. Vessel is angiographically normal.  Third Diagonal Branch  Vessel is small in size. Vessel is angiographically normal.  Third Septal Branch  Vessel is small in size.  Left Circumflex  Vessel is angiographically normal. The true circumflex tapers is a very small caliber vessel around the AV groove  First Obtuse Marginal Branch  Vessel is angiographically normal.  Lateral First Obtuse Marginal Branch  Vessel is moderate in size.  Right Coronary Artery  Vessel is normal in caliber and large. Vessel is angiographically normal.  Acute Marginal Branch  Vessel is small in size.  Right Posterior Descending Artery  Vessel is moderate in size.  Inferior Septal  Vessel is small in size.  Right Posterior Atrioventricular Branch  Vessel is moderate in size. Vessel is angiographically normal.  First Right Posterolateral  Vessel is small in size. Vessel is angiographically normal.  Second Right Posterolateral  Vessel is small in size. Vessel is angiographically normal.  Third Right Posterolateral  Vessel is small in size.  Intervention   No interventions have been documented.  Right Heart   Right Heart Pressures Hemodynamic findings consistent with severe pulmonary hypertension and mitral valve regurgitation. Large V wave noted on PCWP waveform PA pressure/mean: 74/39/52 mmHg PaO2 58%, AO 02 98% Cardiac output/index by Fick: 3.44/1.98 PVR: 489., TPVR 1211; SVR 1723, TSVR 2143  Right Atrium RA pressure/mean: 21/19/18 mmHg.  Right Ventricle RV pressure/EDP: 70/12/18 mmHg.  Wall Motion   No LV gram performed        Left Heart   Left Ventricle Large V wave noted on wedge waveform Fick Cardiac Output 3.44 3.44, Cardiac I1.98-> markedly reduced Left ventricular pressures 147/12/26 mmHg  Mitral Valve The patient has a prosthetic annular ring.  Aortic Valve The patient has a bioprosthetic aortic valve that opens and closes well. No clear gradient noted  across the valve.  Peak gradient may be 10-15 mmHg.  Coronary Diagrams   Diagnostic Diagram       Implants    No implant documentation for this case.  MERGE Images   Show images for Cardiac catheterization   Link to Procedure Log   Procedure Log    Hemo Data    Most Recent Value  Fick Cardiac Output 3.44 L/min  Fick  Cardiac Output Index 1.98 (L/min)/BSA  RA A Wave 21 mmHg  RA V Wave 19 mmHg  RA Mean 18 mmHg  RV Systolic Pressure 70 mmHg  RV Diastolic Pressure 12 mmHg  RV EDP 18 mmHg  PA Systolic Pressure 74 mmHg  PA Diastolic Pressure 39 mmHg  PA Mean 52 mmHg  PW A Wave 28 mmHg  PW V Wave 43 mmHg  PW Mean 31 mmHg  AO Systolic Pressure 341 mmHg  AO Diastolic Pressure 64 mmHg  AO Mean 92 mmHg  LV Systolic Pressure 962 mmHg  LV Diastolic Pressure 22 mmHg  LV EDP 31 mmHg  Arterial Occlusion Pressure Extended Systolic Pressure 229 mmHg  Arterial Occlusion Pressure Extended Diastolic Pressure 62 mmHg  Arterial Occlusion Pressure Extended Mean Pressure 89 mmHg  Left Ventricular Apex Extended Systolic Pressure 798 mmHg  Left Ventricular Apex Extended Diastolic Pressure 12 mmHg  Left Ventricular Apex Extended EDP Pressure 26 mmHg  QP/QS 1  TPVR Index 26.34 HRUI  TSVR Index 46.6 HRUI  PVR SVR Ratio 0.28  TPVR/TSVR Ratio 0.57       Transesophageal Echocardiography  Patient:    [name], [name] MR #:       [id number] Study Date: 07/13/2016 Gender:     F Age:        50 Height:     144.8 cm Weight:     83.6 kg BSA:        1.88 m^2 Pt. Status: Room:   SONOGRAPHER  Florentina Jenny, RDCS  ORDERING     Lyman Bishop MD  PERFORMING   Lyman Bishop MD  cc:  ------------------------------------------------------------------- LV EF: 25% -   30%  ------------------------------------------------------------------- Indications:      V43.3 S/P Heart valve replacement.  ------------------------------------------------------------------- History:   Risk  factors:  Hypertension. Dyslipidemia.  ------------------------------------------------------------------- Study Conclusions  - Left ventricle: The cavity size is dilated. Wall thickness was   normal. Systolic function was severely reduced. The estimated   ejection fraction was in the range of 25% to 30%. Diffuse   hypokinesis. - Aortic valve: Trileaflet, thickened rheumatic appearing leaflets   visualized with 2D/3D images - s/p repair. There is moderate   aortic stenosis - AVA 1.1 cm2. Moderate central AI. - Mitral valve: S/p repair with 26 mm annuloplasty ring,   well-visualized with 2D/3D images. There is mild stenosis and   severe, eccentric regurgitation. - Left atrium: The atrium was dilated. No evidence of thrombus in   the atrial cavity or appendage. - Right atrium: The atrium was dilated. - Atrial septum: No defect or patent foramen ovale was identified. - Tricuspid valve: There was mild regurgitation.  Impressions:  - LVEF 25-30%, moderate rheumatic aortic stenosis - AVA around 1.1   cm2 with moderate central AI, s/p mitral annuloplasty repair with   mild stenosis and severe, eccentric MR.  ------------------------------------------------------------------- Study data:   Study status:  Routine.  Consent:  The risks, benefits, and alternatives to the procedure were explained to the patient and informed consent was obtained.  Procedure:  Initial setup. The patient was brought to the laboratory. Surface ECG leads were monitored. Sedation. Conscious sedation was administered by cardiology staff. Transesophageal echocardiography. Topical anesthesia was obtained using viscous lidocaine. A transesophageal probe was inserted by the attending cardiologistwithout difficulty. Image quality was adequate.  Study completion:  The patient tolerated the procedure well. There were no complications. Administered medications:   Fentanyl, 28mg, IV.  Midazolam, 448m IV.  Diagnostic transesophageal echocardiography.  2D and color Doppler.  Birthdate:  Patient birthdate: Nov 17, 1960.  Age: Patient is 57 yr old.  Sex:  Gender: female.    BMI: 39.9 kg/m^2. Blood pressure:     151/65  Patient status:  Inpatient.  Study date:  Study date: 07/13/2016. Study time: 10:31 AM.  Location: Endoscopy.  -------------------------------------------------------------------  ------------------------------------------------------------------- Left ventricle:  The cavity size is dilated. Wall thickness was normal. Systolic function was severely reduced. The estimated ejection fraction was in the range of 25% to 30%. Diffuse hypokinesis.  ------------------------------------------------------------------- Aortic valve:  Trileaflet, thickened rheumatic appearing leaflets visualized with 2D/3D images - s/p repair. There is moderate aortic stenosis - AVA 1.1 cm2. Moderate central AI.  ------------------------------------------------------------------- Aorta:  The aorta was normal, not dilated, and non-diseased.  ------------------------------------------------------------------- Mitral valve:  S/p repair with 26 mm annuloplasty ring, well-visualized with 2D/3D images. There is mild stenosis and severe, eccentric regurgitation.  Doppler:     Mean gradient (D): 7 mm Hg.  ------------------------------------------------------------------- Left atrium:  The atrium was dilated.  No evidence of thrombus in the atrial cavity or appendage.  ------------------------------------------------------------------- Atrial septum:  No defect or patent foramen ovale was identified.   ------------------------------------------------------------------- Right ventricle:  Poorly visualized.  ------------------------------------------------------------------- Pulmonic valve:   Poorly visualized.  Doppler:  There was  trivial regurgitation.  ------------------------------------------------------------------- Tricuspid valve:   Doppler:  There was mild regurgitation.  ------------------------------------------------------------------- Right atrium:  The atrium was dilated.  ------------------------------------------------------------------- Pericardium:  There was no pericardial effusion.   ------------------------------------------------------------------- Post procedure conclusions Ascending Aorta:  - The aorta was normal, not dilated, and non-diseased.  ------------------------------------------------------------------- Measurements   Mitral valve                 Value  Mitral mean velocity, D      126   cm/s  Mitral mean gradient, D      7     mm Hg  Mitral annulus VTI, D        60.9  cm  Legend: (L)  and  (H)  mark values outside specified reference range.  ------------------------------------------------------------------- Prepared and Electronically Authenticated by  Lyman Bishop MD 2018-02-21T15:57:54   Right Heart Cath  Conclusion   1. Optimized filling pressures.  2. Cardiac output remains low.  3. Mild pulmonary arterial hypertension.   Patient is feeling great despite ongoing low output.  Will restart digoxin at 0.0625 every other day and spironolactone 12.5 daily with low K.  Will discuss with Dr. Roxy Manns.     Procedural Details/Technique   Technical Details Procedure: Right Heart Cath  Indication: CHF   Procedural Details: The right brachial area was prepped, draped, and anesthetized with 1% lidocaine. There was a pre-existing peripheral IV in the right brachial area. This was replaced with a 21F venous sheath. A Swan-Ganz catheter was used for the right heart catheterization. Standard protocol was followed for recording of right heart pressures and sampling of oxygen saturations. Fick cardiac output was calculated. There were no immediate procedural  complications. The patient was transferred to the post catheterization recovery area for further monitoring.   Estimated blood loss <50 mL.  During this procedure no sedation was administered.  Right Heart   Right Heart Pressures R[facility] Procedural Findings: Hemodynamics (mmHg) RA mean 4 RV 47/7 PA 44/17, mean 28 PCWP mean 15  Oxygen saturations: PA 53% AO 99%  Cardiac Output (Fick) 2.99  Cardiac Index (Fick) 1.73 PVR 4 WU  Implants    No implant documentation for this  case.  MERGE Images   Show images for Cardiac catheterization   Link to Procedure Log   Procedure Log    Hemo Data    Most Recent Value  Fick Cardiac Output 2.99 L/min  Fick Cardiac Output Index 1.73 (L/min)/BSA  RA A Wave 8 mmHg  RA V Wave 5 mmHg  RA Mean 4 mmHg  RV Systolic Pressure 47 mmHg  RV Diastolic Pressure 0 mmHg  RV EDP 7 mmHg  PA Systolic Pressure 44 mmHg  PA Diastolic Pressure 17 mmHg  PA Mean 28 mmHg  PW A Wave 16 mmHg  PW V Wave 18 mmHg  PW Mean 15 mmHg  QP/QS 1  TPVR Index 16.77 HRUI      Transthoracic Echocardiography  Patient:    Adajah, Cocking MR #:       [id number] Study Date: 01/19/2017 Gender:     F Age:        19 Height:     144.8 cm Weight:     83 kg BSA:        1.88 m^2 Pt. Status: Room:   [place], [place]    Loralie Champagne, M.D.  SONOGRAPHER  Tresa Res, RDCS  PERFORMING   [facility]  ORDERING     Shirley Friar  cc:  ------------------------------------------------------------------- LV EF: 30% -   35%  ------------------------------------------------------------------- Indications:      CHF - 428.0.  ------------------------------------------------------------------- History:   PMH:   Mitral valve disease.  Aortic valve disease. Risk factors:  Hypertension.  ------------------------------------------------------------------- Study Conclusions  - Left ventricle: The cavity size was mildly  dilated. Systolic   function was moderately to severely reduced. The estimated   ejection fraction was in the range of 30% to 35%. - Aortic valve: There was severe regurgitation. Valve area (VTI):   0.76 cm^2. Valve area (Vmax): 0.78 cm^2. Valve area (Vmean): 0.76   cm^2. - Mitral valve: There was mild regurgitation. Valve area by   pressure half-time: 2.22 cm^2. - Left atrium: The atrium was mildly dilated. - Right ventricle: The cavity size was mildly dilated. Systolic   function was mildly reduced.  ------------------------------------------------------------------- Labs, prior tests, procedures, and surgery: Mitral valve repair.  Aortic valve repair.  ------------------------------------------------------------------- Study data:  Comparison was made to the study of 06/15/2016.  Study status:  Routine.  Study completion:  There were no complications.         Transthoracic echocardiography.  M-mode, complete 2D, spectral Doppler, and color Doppler.  Birthdate:  Patient birthdate: April 27, 1961.  Age:  Patient is 57 yr old.  Sex:  Gender: female.    BMI: 39.6 kg/m^2.  Blood pressure:     114/62  Patient status:  Outpatient.  Study date:  Study date: 01/19/2017. Study time: 11:12 AM.  Location:  Echo laboratory.  -------------------------------------------------------------------  ------------------------------------------------------------------- Left ventricle:  Global longitudinal strain is -10.0%.  The cavity size was mildly dilated. Systolic function was moderately to severely reduced. The estimated ejection fraction was in the range of 30% to 35%.  ------------------------------------------------------------------- Aortic valve:   Doppler:  There was severe regurgitation.    VTI ratio of LVOT to aortic valve: 0.3. Valve area (VTI): 0.76 cm^2. Indexed valve area (VTI): 0.41 cm^2/m^2. Peak velocity ratio of LVOT to aortic valve: 0.3. Valve area (Vmax): 0.78 cm^2.  Indexed valve area (Vmax): 0.42 cm^2/m^2. Mean velocity ratio of LVOT to aortic valve: 0.29. Valve area (Vmean): 0.76 cm^2. Indexed valve area (Vmean): 0.4 cm^2/m^2.  Mean gradient (S): 30 mm Hg. Peak gradient (S): 46 mm Hg.  ------------------------------------------------------------------- Aorta:  Aortic root: The aortic root was normal in size. Ascending aorta: The ascending aorta was normal in size.  ------------------------------------------------------------------- Mitral valve:   Doppler:  There was mild regurgitation.    Valve area by pressure half-time: 2.22 cm^2. Indexed valve area by pressure half-time: 1.18 cm^2/m^2.    Mean gradient (D): 4 mm Hg. Peak gradient (D): 6 mm Hg.  ------------------------------------------------------------------- Left atrium:  The atrium was mildly dilated.  ------------------------------------------------------------------- Right ventricle:  Poorly visualized. The cavity size was mildly dilated. Systolic function was mildly reduced.  ------------------------------------------------------------------- Pulmonic valve:   Poorly visualized.  ------------------------------------------------------------------- Right atrium:  The atrium was normal in size.  ------------------------------------------------------------------- Pericardium:  There was no pericardial effusion.  ------------------------------------------------------------------- Measurements   Left ventricle                            Value          Reference  LV ID, ED, PLAX chordal            (H)    52.5  mm       43 - 52  LV ID, ES, PLAX chordal            (H)    45.4  mm       23 - 38  LV fx shortening, PLAX chordal     (L)    14    %        >=29  LV PW thickness, ED                       7.98  mm       ---------  IVS/LV PW ratio, ED                       1.27           <=1.3  LV ejection fraction, 1-p A4C             37    %        ---------  Longitudinal  strain, TDI                  10    %        ---------    Ventricular septum                        Value          Reference  IVS thickness, ED                         10.1  mm       ---------    LVOT                                      Value          Reference  LVOT ID, S                                18.1  mm       ---------  LVOT area  2.57  cm^2     ---------  LVOT peak velocity, S                     103   cm/s     ---------  LVOT mean velocity, S                     76.1  cm/s     ---------  LVOT VTI, S                               28.4  cm       ---------  LVOT peak gradient, S                     4     mm Hg    ---------  Stroke volume (SV), LVOT DP               73.1  ml       ---------  Stroke index (SV/bsa), LVOT DP            38.9  ml/m^2   ---------    Aortic valve                              Value          Reference  Aortic valve peak velocity, S             339   cm/s     ---------  Aortic valve mean velocity, S             258   cm/s     ---------  Aortic valve VTI, S                       95.7  cm       ---------  Aortic mean gradient, S                   30    mm Hg    ---------  Aortic peak gradient, S                   46    mm Hg    ---------  VTI ratio, LVOT/AV                        0.3            ---------  Aortic valve area, VTI                    0.76  cm^2     ---------  Aortic valve area/bsa, VTI                0.41  cm^2/m^2 ---------  Velocity ratio, peak, LVOT/AV             0.3            ---------  Aortic valve area, peak velocity          0.78  cm^2     ---------  Aortic valve area/bsa, peak               0.42  cm^2/m^2 ---------  velocity  Velocity ratio, mean, LVOT/AV  0.29           ---------  Aortic valve area, mean velocity          0.76  cm^2     ---------  Aortic valve area/bsa, mean               0.4   cm^2/m^2 ---------  velocity  Aortic regurg pressure half-time          584   ms       ---------     Aorta                                     Value          Reference  Aortic root ID, ED                        29    mm       ---------    Left atrium                               Value          Reference  LA ID, A-P, ES                            38    mm       ---------  LA ID/bsa, A-P                            2.02  cm/m^2   <=2.2  LA volume, S                              62.9  ml       ---------  LA volume/bsa, S                          33.5  ml/m^2   ---------  LA volume, ES, 1-p A4C                    66.7  ml       ---------  LA volume/bsa, ES, 1-p A4C                35.5  ml/m^2   ---------  LA volume, ES, 1-p A2C                    55.6  ml       ---------  LA volume/bsa, ES, 1-p A2C                29.6  ml/m^2   ---------    Mitral valve                              Value          Reference  Mitral E-wave peak velocity               121   cm/s     ---------  Mitral A-wave peak velocity               121  cm/s     ---------  Mitral mean velocity, D                   99.5  cm/s     ---------  Mitral deceleration time           (H)    408   ms       150 - 230  Mitral pressure half-time                 99    ms       ---------  Mitral mean gradient, D                   4     mm Hg    ---------  Mitral peak gradient, D                   6     mm Hg    ---------  Mitral E/A ratio, peak                    1              ---------  Mitral valve area, PHT, DP                2.22  cm^2     ---------  Mitral valve area/bsa, PHT, DP            1.18  cm^2/m^2 ---------  Mitral annulus VTI, D                     57.7  cm       ---------    Right ventricle                           Value          Reference  TAPSE                                     16.1  mm       ---------  RV s&', lateral, S                         7.83  cm/s     ---------  Legend: (L)  and  (H)  mark values outside specified reference  range.  ------------------------------------------------------------------- Prepared and Electronically Authenticated by  Mertie Moores, M.D. 2018-08-30T15:29:47   Transesophageal Echocardiography  Patient:    [name], [name] MR #:       [id number] Study Date: 06/28/2017 Gender:     F Age:        56 Height:     144.8 cm Weight:     83 kg BSA:        1.88 m^2 Pt. Status: Room:   ADMITTING    Loralie Champagne, M.D.  ATTENDING    Loralie Champagne, M.D.  ORDERING     Loralie Champagne, M.D.  PERFORMING   Loralie Champagne, M.D.  REFERRING    Loralie Champagne, M.D.  SONOGRAPHER  [name], RDCS  cc:  ------------------------------------------------------------------- LV EF: 45%  ------------------------------------------------------------------- Indications:      424.1 Aortic valve disorders.  ------------------------------------------------------------------- History:   PMH:  H/O aortic valve and mitral valve repair in 2010. CHF. CKD III.  ------------------------------------------------------------------- Study  Conclusions  - Left ventricle: The cavity size was mildly dilated. Wall   thickness was increased in a pattern of mild LVH. The estimated   ejection fraction was 45%. Diffuse hypokinesis. - Aortic valve: The aortic valve was thickened and trileaflet   (rheumatic-appearing). There was incomplete coaptation. There   appeared to be severe aortic insufficiency with mild to moderate   stenosis (mean gradient 19 mmHg). I did not see holodiastolic   flow reversal in the descending thoracic aorta. - Aorta: Normal caliber thoracic aorta with minimal plaque. - Mitral valve: Status post mitral valve repair with mean gradient   5 mmHg and MVA by PHT of 1.8 cm^2, suggesting mild stenosis.   There was trivial mitral regurgitation. - Left atrium: The atrium was mildly dilated. No evidence of   thrombus in the atrial cavity or appendage. - Right ventricle: The cavity size  was normal. Systolic function   was mildly reduced. - Right atrium: No evidence of thrombus in the atrial cavity or   appendage. - Tricuspid valve: Peak RV-RA gradient (S): 24 mm Hg.  Impressions:  - 1. EF improved to 45%.   2. 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   3. S/p mitral valve repair with no more than mild stenosis.  ------------------------------------------------------------------- Study data:   Study status:  Routine.  Consent:  The risks, benefits, and alternatives to the procedure were explained to the patient and informed consent was obtained.  Procedure:  Initial setup. The patient was brought to the laboratory. Surface ECG leads were monitored. Sedation. Moderate sedation was administered by cardiology staff. Transesophageal echocardiography. An adult multiplane transesophageal probe was inserted by the attending cardiologistwithout difficulty. Image quality was adequate.  Study completion:  The patient tolerated the procedure well. There were no complications.  Administered medications:   Midazolam, 59m, IV. Fentanyl, 516m, IV.          Diagnostic transesophageal echocardiography.  2D and color Doppler.  Birthdate:  Patient birthdate: [birth year] Age:  Patient is [age]r old.  Sex:  Gender: female.    BMI: 39.6 kg/m^2.  Blood pressure:     119/76  Patient status:  Outpatient.  Study date:  Study date: 06/28/2017. Study time: 11:42 AM.  Location:  Endoscopy.  -------------------------------------------------------------------  ------------------------------------------------------------------- Left ventricle:  The cavity size was mildly dilated. Wall thickness was increased in a pattern of mild LVH. The estimated ejection fraction was 45%. Diffuse hypokinesis.  ------------------------------------------------------------------- Aortic valve:  The aortic valve was thickened and  trileaflet (rheumatic-appearing). There was incomplete coaptation. There appeared to be severe aortic insufficiency with mild to moderate stenosis (mean gradient 19 mmHg). I did not see holodiastolic flow reversal in the descending thoracic aorta.  Doppler:     Mean gradient (S): 19 mm Hg. Peak gradient (S): 36 mm Hg.  ------------------------------------------------------------------- Aorta:  Normal caliber thoracic aorta with minimal plaque.  ------------------------------------------------------------------- Mitral valve:  Status post mitral valve repair with mean gradient 5 mmHg and MVA by PHT of 1.8 cm^2, suggesting mild stenosis. There was trivial mitral regurgitation.  Doppler:     Indexed valve area by pressure half-time: 0.99 cm^2/m^2.  ------------------------------------------------------------------- Left atrium:  The atrium was mildly dilated.  No evidence of thrombus in the atrial cavity or appendage.  ------------------------------------------------------------------- Atrial septum:  No PFO or ASD by color doppler.  ------------------------------------------------------------------- Right ventricle:  The cavity size was normal. Systolic function was mildly reduced.  ------------------------------------------------------------------- Pulmonic valve:    Structurally normal  valve.   Cusp separation was normal.  ------------------------------------------------------------------- Tricuspid valve:   Doppler:  There was trivial regurgitation.   ------------------------------------------------------------------- Right atrium:  The atrium was normal in size.  No evidence of thrombus in the atrial cavity or appendage.  ------------------------------------------------------------------- Pericardium:  There was no pericardial effusion.   ------------------------------------------------------------------- Post procedure conclusions Ascending Aorta:  - Normal  caliber thoracic aorta with minimal plaque.  ------------------------------------------------------------------- Measurements   Aortic valve                           Value  Aortic valve peak velocity, S          299   cm/s  Aortic valve mean velocity, S          196   cm/s  Aortic valve VTI, S                    73    cm  Aortic mean gradient, S                19    mm Hg  Aortic peak gradient, S                36    mm Hg  Aortic regurg pressure half-time       464   ms    Mitral valve                           Value  Mitral mean velocity, D                93.3  cm/s  Mitral pressure half-time              119   ms  Mitral valve area/bsa, PHT, DP         0.99  cm^2/m^2  Mitral annulus VTI, D                  45    cm    Tricuspid valve                        Value  Tricuspid regurg peak velocity         243   cm/s  Tricuspid peak RV-RA gradient          24    mm Hg  Legend: (L)  and  (H)  mark values outside specified reference range.  ------------------------------------------------------------------- Prepared and Electronically Authenticated by  Loralie Champagne, M.D. 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  She developed severe aortic insufficiency for which she presented more than 1 year ago with an acute exacerbation of CHF.  At that time she had severely depressed left ventricular systolic function and severe mitral regurgitation.  Remarkably, she has been doing much better for the past year on medical therapy with close follow-up through the advanced heart failure clinic.  Follow-up echocardiograms have documented continued gradual improvement in left ventricular systolic function and resolution of mitral regurgitation.  At present the patient reports to be quite stable and doing  fairly well on her current medical regimen, with symptoms of exertional shortness of breath occurring only with more strenuous physical activity.  I have personally reviewed the  patient's recent follow-up transesophageal echocardiogram which demonstrates some further improvement in global left ventricular systolic function but severe aortic insufficiency.  There is mild aortic stenosis.  Her mitral valve repair appears intact and there is only trivial residual mitral regurgitation.  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  Mean transvalvular gradient across the mitral valve estimated only 5 mmHg.    Options include continued medical therapy with close follow-up versus consideration of redo aortic valve replacement.  The patient is remarkably stable at this time, but her aortic insufficiency is quite severe and there is no question that her long-term prognosis would remain guarded at best without surgical intervention.  It is unclear whether or not transcatheter aortic valve replacement could be considered as an option, as her original aortic valve repair was performed without ring annuloplasty and she may have a relatively small sized aortic root.    Plan:  I have discussed matters at length with the patient and her daughter in the office today.  We discussed continued medical therapy as a viable and reasonable option at this time.  However, we have also discussed the fact that because of her severe aortic insufficiency there is a high likelihood that her long-term prognosis without aortic valve replacement may be guarded at best and she will be at high risk for further acute exacerbations of chronic congestive heart failure and possible decline in left ventricular function in the future.  As a  next step the patient will undergo cardiac gated CT angiogram of the heart to further evaluate the size and functional anatomy of the patient's aortic root and explore whether or not transcatheter aortic valve replacement might conceivably be a viable option.  Her case will be reviewed by a multidisciplinary team of specialists and the patient will return to our office in approximately 3 weeks to discuss treatment options further.   I spent in excess of 45 minutes during the conduct of this office consultation and >50% of this time involved direct face-to-face encounter with the patient for counseling and/or coordination of their care.   Valentina Gu. Roxy Manns, MD 08/28/2017 4:12 PM

## 2017-09-08 ENCOUNTER — Ambulatory Visit (HOSPITAL_COMMUNITY): Admission: RE | Admit: 2017-09-08 | Payer: Medicaid Other | Source: Ambulatory Visit

## 2017-09-21 ENCOUNTER — Other Ambulatory Visit (HOSPITAL_COMMUNITY): Payer: Self-pay | Admitting: Student

## 2017-09-25 ENCOUNTER — Encounter: Payer: Medicaid Other | Admitting: Thoracic Surgery (Cardiothoracic Vascular Surgery)

## 2017-09-26 ENCOUNTER — Ambulatory Visit (HOSPITAL_COMMUNITY)
Admission: RE | Admit: 2017-09-26 | Discharge: 2017-09-26 | Disposition: A | Discharge status: Home / Self Care | Payer: Medicaid Other | Source: Ambulatory Visit | Attending: Thoracic Surgery (Cardiothoracic Vascular Surgery) | Admitting: Thoracic Surgery (Cardiothoracic Vascular Surgery)

## 2017-09-26 DIAGNOSIS — I350 Nonrheumatic aortic (valve) stenosis: Secondary | ICD-10-CM

## 2017-09-26 DIAGNOSIS — I351 Nonrheumatic aortic (valve) insufficiency: Secondary | ICD-10-CM | POA: Diagnosis not present

## 2017-09-26 DIAGNOSIS — I517 Cardiomegaly: Secondary | ICD-10-CM | POA: Diagnosis not present

## 2017-09-26 DIAGNOSIS — Z9889 Other specified postprocedural states: Secondary | ICD-10-CM | POA: Insufficient documentation

## 2017-09-26 DIAGNOSIS — I2510 Atherosclerotic heart disease of native coronary artery without angina pectoris: Secondary | ICD-10-CM | POA: Diagnosis not present

## 2017-09-26 DIAGNOSIS — Z01818 Encounter for other preprocedural examination: Secondary | ICD-10-CM | POA: Diagnosis not present

## 2017-09-26 DIAGNOSIS — N261 Atrophy of kidney (terminal): Secondary | ICD-10-CM | POA: Diagnosis not present

## 2017-09-26 DIAGNOSIS — I700 Atherosclerosis of aorta: Secondary | ICD-10-CM | POA: Diagnosis not present

## 2017-09-26 DIAGNOSIS — I352 Nonrheumatic aortic (valve) stenosis with insufficiency: Secondary | ICD-10-CM

## 2017-09-26 MED ORDER — IOPAMIDOL (ISOVUE-370) INJECTION 76%
100.0000 mL | Freq: Once | INTRAVENOUS | Status: AC | PRN
  Administered 2017-09-26: 100 mL via INTRAVENOUS

## 2017-09-26 MED ORDER — IOPAMIDOL (ISOVUE-370) INJECTION 76%
INTRAVENOUS | Status: AC
  Filled 2017-09-26: qty 100

## 2017-09-29 ENCOUNTER — Other Ambulatory Visit: Payer: Self-pay

## 2017-09-29 ENCOUNTER — Ambulatory Visit: Payer: Medicaid Other | Admitting: Thoracic Surgery (Cardiothoracic Vascular Surgery)

## 2017-09-29 ENCOUNTER — Encounter: Payer: Self-pay | Admitting: Thoracic Surgery (Cardiothoracic Vascular Surgery)

## 2017-09-29 ENCOUNTER — Encounter (INDEPENDENT_AMBULATORY_CARE_PROVIDER_SITE_OTHER): Payer: Self-pay

## 2017-09-29 VITALS — BP 100/60 | HR 62 | Resp 16 | Ht <= 58 in | Wt 188.0 lb

## 2017-09-29 DIAGNOSIS — Z9889 Other specified postprocedural states: Secondary | ICD-10-CM | POA: Diagnosis not present

## 2017-09-29 DIAGNOSIS — I428 Other cardiomyopathies: Secondary | ICD-10-CM

## 2017-09-29 DIAGNOSIS — I062 Rheumatic aortic stenosis with insufficiency: Secondary | ICD-10-CM

## 2017-09-29 DIAGNOSIS — I099 Rheumatic heart disease, unspecified: Secondary | ICD-10-CM | POA: Diagnosis not present

## 2017-09-29 DIAGNOSIS — I5042 Chronic combined systolic (congestive) and diastolic (congestive) heart failure: Secondary | ICD-10-CM | POA: Diagnosis not present

## 2017-09-29 DIAGNOSIS — I351 Nonrheumatic aortic (valve) insufficiency: Secondary | ICD-10-CM | POA: Diagnosis not present

## 2017-09-29 NOTE — Progress Notes (Signed)
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  She was recently seen in our office on August 28, 2017 at which time we discussed treatment options and subsequently scheduled the patient for cardiac gated CT angiogram to evaluate the feasibility of transcatheter aortic valve replacement.  She returns the office today to review the results of her scan and discuss treatment options further.  She reports no significant change in her clinical condition.  She continues to experience exertional shortness of breath with moderate level exertion.  She denies resting shortness of breath.  She has not been having problems recently with orthopnea, PND, or lower extremity edema.  Her weight has been stable on Lasix 80 mg twice daily.   Current Outpatient Medications  Medication Sig Dispense Refill  . acetaminophen (TYLENOL) 325 MG tablet Take 650 mg every 6 (six) hours as needed by mouth for mild pain.    [name] albuterol (PROVENTIL HFA;VENTOLIN HFA) 108 (90 BASE) MCG/ACT inhaler Inhale 2 puffs into the lungs every 6 (six) hours as needed for shortness of breath.     [name] aspirin 81 MG chewable tablet Chew 1 tablet (81 mg total) by mouth daily. 30 tablet 3  . beclomethasone (QVAR) 40 MCG/ACT inhaler Inhale 2 puffs into the lungs daily as needed (shortness of breath).     . carvedilol  (COREG) 12.5 MG tablet TAKE 1 TABLET BY MOUTH TWICE A DAY WITH MEALS 60 tablet 2  . furosemide (LASIX) 40 MG tablet Take 2 tablets (80 mg total) by mouth daily. 60 tablet 3  . levothyroxine (SYNTHROID, LEVOTHROID) 125 MCG tablet Take 125 mcg daily by mouth.  0  . potassium chloride (K-DUR) 10 MEQ tablet Take 2 tablets (20 mEq total) by mouth daily. 60 tablet 3  . ranitidine (ZANTAC) 150 MG tablet Take 150 mg by mouth 2 (two) times daily.  0  . rosuvastatin (CRESTOR) 20 MG tablet Take 1 tablet (20 mg total) by mouth daily. 90 tablet 3  . sacubitril-valsartan (ENTRESTO) 49-51 MG Take 1 tablet by mouth 2 (two) times daily. 60 tablet 3  . spironolactone (ALDACTONE) 25 MG tablet take 1 tablet by mouth once daily 30 tablet 3   No current facility-administered medications for this visit.       Physical Exam:   BP 100/60 (BP Location: Right Arm, Patient Position: Sitting, Cuff Size: Normal)   Pulse 62   Resp 16   Ht 4\' 9"  (1.448 m)   Wt 188 lb (85.3 kg)   LMP 03/06/2012   SpO2 96% Comment: ON RA  BMI 40.68 kg/m   General:  Obese  Chest:   Clear to auscultation  CV:   Regular rate and rhythm with grade 3/6 systolic murmur  Incisions:  n/a  Abdomen:  Soft nontender  Extremities:  Warm and well-perfused, no edema  Diagnostic Tests:  Cardiac TAVR CT  TECHNIQUE: The patient was scanned on a [employer]. A 120 kV retrospective scan was triggered in the descending thoracic aorta at 111 HU's. Gantry rotation speed was 250 msecs and collimation was .6 mm. No beta blockade or nitro were given. The 3D data set was reconstructed in 5% intervals of the R-R cycle. Systolic and diastolic phases were analyzed on a dedicated work station using MPR, MIP and VRT modes. The patient received 80 cc of contrast.  FINDINGS: Aortic Valve: Trileaflet aortic valve with severely thickened leaflets with minimal calcifications, leaflets open well. There are no calcifications in the  LVOT.  Aorta: Normal size, no calcifications, no dissection.  Sinotubular Junction: 27 x 26 mm  Ascending Thoracic Aorta: 33 x 32 mm  Aortic Arch: 24 x 22 mm  Descending Thoracic Aorta: 21 x 19 mm  Sinus of Valsalva Measurements:  Non-coronary: 28 mm  Right -coronary: 26 mm  Left -coronary: 28 mm  Sinus of Valsalva Height:  Right -coronary: 14 mm  Left -coronary: 15 mm  Coronary Artery Height above Annulus:  Left Main: 8.8 mm  Right Coronary: 9.5 mm  Virtual Basal Annulus Measurements:  Maximum/Minimum Diameter: 23.2 x 20.4 mm  Mean Diameter: 21.5 mm  Perimeter: 68.9 mm  Area: 362 mm  Coronary Arteries: Normal origin, right dominance. Minimal calcified plaque in the proximal LAD associated with 0-25% stenosis.  Optimum Fluoroscopic Angle for Delivery: LAO 4 CAU 5  IMPRESSION: 1. Trileaflet aortic valve with severely thickened leaflets with minimal calcifications, and good leaflets excursions. Annular measurements suitable for delivery of a 23 mm Edwards-SAPIEN 3 valve or a 26 mm Medtronic Evolut R valve. Mitral annuloplasty ring is present 2 mm bellow the left coronary leaflet adjacent to the LVOT but should not cause obstruction in an aortic valve placement.  2. Borderline coronary to annulus distance (LM to annulus 8.8 mm, RCA to annulus 9.5 mm).  3. Optimum Fluoroscopic Angle for Delivery:   LAO 4 CAU 5.  4. 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  5. No thrombus in the left atrial appendage.  6. Moderately dilated pulmonary artery measuring 37 mm.   Electronically Signed   By: Ena Dawley   On: 09/26/2017 18:07    CT ANGIOGRAPHY CHEST, ABDOMEN AND PELVIS  TECHNIQUE: Multidetector CT imaging through the chest, abdomen and pelvis was performed using the standard protocol during bolus administration of intravenous contrast. Multiplanar reconstructed images and MIPs were obtained and reviewed  to evaluate the vascular anatomy.  CONTRAST:  123mL ISOVUE-370 IOPAMIDOL (ISOVUE-370) INJECTION 76%  COMPARISON:  CT of the chest, abdomen and pelvis 08/23/2016.  FINDINGS: CTA CHEST FINDINGS  Cardiovascular: Heart size is enlarged with left ventricular dilatation, including marked thinning of the left ventricular apical myocardium which is presumably from scarring from prior distal LAD territory myocardial infarction(s). There is no significant pericardial fluid, thickening or pericardial calcification. No significant atherosclerotic calcifications noted in the thoracic aorta. No definite coronary artery calcifications. Status post median sternotomy for mitral annuloplasty. Severe thickening of the aortic valve. Pulmonic trunk is dilated measuring 4 cm in diameter.  Mediastinum/Lymph Nodes: No pathologically enlarged mediastinal or hilar lymph nodes. Esophagus is unremarkable in appearance. No axillary lymphadenopathy.  Lungs/Pleura: Scattered areas of linear scarring are noted throughout both lungs. No acute consolidative airspace disease. No pleural effusions. 5 mm nodule in the left upper lobe near the apex (axial image 30 of series 7) is unchanged compared to the prior examination.  No other larger more suspicious appearing pulmonary nodules or masses are noted.  Musculoskeletal/Soft Tissues: There are no aggressive appearing lytic or blastic lesions noted in the visualized portions of the skeleton. Median sternotomy wires.  CTA ABDOMEN AND PELVIS FINDINGS  Hepatobiliary: No suspicious cystic or solid hepatic lesions. No intra or extrahepatic biliary ductal dilatation. Gallbladder is normal in appearance.  Pancreas: No pancreatic mass. No pancreatic ductal dilatation. No pancreatic or peripancreatic fluid or inflammatory changes.  Spleen: Unremarkable.  Adrenals/Urinary Tract: Mild atrophy of the kidneys bilaterally. No suspicious renal lesions. Bilateral  adrenal glands are normal in appearance. No hydroureteronephrosis. Urinary bladder is normal in appearance.  Stomach/Bowel: Normal appearance of the stomach. No pathologic dilatation of small bowel or colon. Normal appendix.  Vascular/Lymphatic: Aortic atherosclerosis, with vascular findings and measurements pertinent to potential TAVR procedure, as detailed below. Celiac axis, superior mesenteric artery, inferior mesenteric artery and single renal arteries bilaterally are all widely patent without hemodynamically significant stenosis. No pathologically enlarged lymph nodes are noted in the abdomen or pelvis.  Reproductive: Uterus is enlarged and heterogeneous in appearance with multiple small areas, some of which are densely calcified, presumably multiple small fibroids. Ovaries are unremarkable in appearance.  Other: No significant volume of ascites.  No pneumoperitoneum.  Musculoskeletal: There are no aggressive appearing lytic or blastic lesions noted in the visualized portions of the skeleton.  VASCULAR MEASUREMENTS PERTINENT TO TAVR:  AORTA:  Minimal Aortic Diameter-10 x 11 mm  Severity of Aortic Calcification-mild  RIGHT PELVIS:  Right Common Iliac Artery -  Minimal Diameter-6.9 x 6.4 mm  Tortuosity-mild  Calcification-none  Right External Iliac Artery -  Minimal Diameter-5.8 x 5.8 mm  Tortuosity - mild  Calcification-none  Right Common Femoral Artery -  Minimal Diameter-5.9 x 5.2 mm  Tortuosity - mild  Calcification-mild  LEFT PELVIS:  Left Common Iliac Artery -  Minimal Diameter-7.3 x 7.0 mm  Tortuosity - mild  Calcification-mild  Left External Iliac Artery -  Minimal Diameter-5.9 x 6.1 mm  Tortuosity - mild  Calcification-none  Left Common Femoral Artery -  Minimal Diameter-6.1 x 5.7 mm  Tortuosity - mild  Calcification-none  Review of the MIP images confirms the above  findings.  IMPRESSION: 1. Vascular findings and measurements pertinent to potential TAVR procedure, as detailed above. 2. Severe thickening of the aortic valve, compatible with the reported clinical history of aortic valve disease. 3. Cardiomegaly with left ventricular dilatation. There is left ventricular apical myocardial thinning, suggesting prior distal LAD territory myocardial infarction(s), although no definite coronary artery calcifications are identified. 4. Status post median sternotomy for mitral annuloplasty. 5. Dilatation of the pulmonic trunk (4.0 cm in diameter), concerning for pulmonary arterial hypertension. 6. Mild atrophy of the kidneys bilaterally. 7. Aortic atherosclerosis. 8. Additional incidental findings, as above.  Aortic Atherosclerosis (ICD10-I70.0).   Electronically Signed   By: Vinnie Langton M.D.   On: 09/27/2017 09:59    Impression:  Patient has long-standing rheumatic valvular heart disease with chronic combined systolic and diastolic congestive heart failure status post aortic valve repair and mitral valve repair in 2010.  She developed moderate aortic stenosis with severe aortic insufficiency for which she presented more than 1 year ago with an acute exacerbation of CHF.  At that time she had severely depressed left ventricular systolic function and severe mitral regurgitation.  Remarkably, she has been doing much better for the past year on medical therapy with close follow-up through the advanced heart failure clinic.  Follow-up echocardiograms have documented continued  gradual improvement in left ventricular systolic function and resolution of mitral regurgitation.  At present the patient reports to be quite stable and doing fairly well on her current medical regimen, with symptoms of exertional shortness of breath occurring only with more strenuous physical activity.  I have personally reviewed the patient's recent follow-up transthoracic  and transesophageal echocardiograms which demonstrates some further improvement in global left ventricular systolic function but mild to moderate aortic stenosis with severe aortic insufficiency.     Peak velocity across aortic valve measured greater than or equal to 3 m/s, although flows across the valve are probably elevated due to the underlying severe aortic insufficiency.  Her mitral valve repair appears intact and there is only trivial residual mitral regurgitation.  Mean transvalvular gradient across the mitral valve estimated only 5 mmHg.  I do not feel that redo mitral valve repair or replacement is indicated.      Options include continued medical therapy with close follow-up versus consideration of redo aortic valve replacement.  The patient is remarkably stable at this time, but her aortic insufficiency is quite severe and there is no question that her long-term prognosis would remain guarded at best without surgical intervention.  Cardiac-gated CTA of the heart reveals anatomical characteristics consistent with aortic stenosis possibly suitable for treatment by transcatheter aortic valve replacement although there is essentially no annular calcification and minimal leaflet calcification in the aortic valve, the dimensions of the aortic root and coronary sinuses are relatively small, and the coronary arteries are less than 10 mm from the aortic annulus.  CTA of the aorta and iliac vessels demonstrate what appears to be adequate pelvic vascular access to facilitate a transfemoral approach.    Plan:  I have again discussed options with the patient in the office today.  We discussed alternatives including continued medical therapy, redo sternotomy for aortic valve replacement which would likely require aortic root enlargement or root replacement, and transcatheter aortic valve replacement.  We discussed concerns regarding her long-term prognosis without surgical intervention on continued medical  therapy.  We discussed the very high risks associated with conventional surgical aortic valve replacement.  Finally, we discussed issues specific to transcatheter aortic valve replacement including questions about long term valve durability, the potential for paravalvular leak, possible increased risk of need for permanent pacemaker placement, and other technical complications related to the procedure itself including acute coronary occlusion and/or valve embolization.  All questions answered.  The patient desires to discuss matters further with her family and return for another office visit in the near future when some of her family can be present before she makes a final decision.  Her numerous diagnostic studies will be reviewed by a multidisciplinary team of specialists and recommendations will be presented when she returns to our office for follow-up in 2 to 3 weeks.   I spent in excess of 15 minutes during the conduct of this office consultation and >50% of this time involved direct face-to-face encounter with the patient for counseling and/or coordination of their care.    Valentina Gu. Roxy Manns, MD 09/29/2017 2:04 PM

## 2017-09-29 NOTE — Patient Instructions (Signed)
Continue all previous medications without any changes at this time  

## 2017-10-11 ENCOUNTER — Other Ambulatory Visit (HOSPITAL_COMMUNITY): Payer: Self-pay | Admitting: *Deleted

## 2017-10-11 MED ORDER — SPIRONOLACTONE 25 MG PO TABS: 25.0000 mg | ORAL_TABLET | Freq: Every day | ORAL | 3 refills | Status: DC

## 2017-10-23 ENCOUNTER — Encounter: Payer: Self-pay | Admitting: Thoracic Surgery (Cardiothoracic Vascular Surgery)

## 2017-10-23 ENCOUNTER — Other Ambulatory Visit: Payer: Self-pay

## 2017-10-23 ENCOUNTER — Ambulatory Visit: Payer: Medicaid Other | Admitting: Thoracic Surgery (Cardiothoracic Vascular Surgery)

## 2017-10-23 VITALS — BP 86/60 | HR 61 | Resp 18 | Ht <= 58 in | Wt 195.6 lb

## 2017-10-23 DIAGNOSIS — I272 Pulmonary hypertension, unspecified: Secondary | ICD-10-CM

## 2017-10-23 DIAGNOSIS — I351 Nonrheumatic aortic (valve) insufficiency: Secondary | ICD-10-CM | POA: Diagnosis not present

## 2017-10-23 DIAGNOSIS — I099 Rheumatic heart disease, unspecified: Secondary | ICD-10-CM

## 2017-10-23 DIAGNOSIS — I062 Rheumatic aortic stenosis with insufficiency: Secondary | ICD-10-CM

## 2017-10-23 DIAGNOSIS — Z9889 Other specified postprocedural states: Secondary | ICD-10-CM | POA: Diagnosis not present

## 2017-10-23 DIAGNOSIS — I7409 Other arterial embolism and thrombosis of abdominal aorta: Secondary | ICD-10-CM

## 2017-10-23 DIAGNOSIS — I428 Other cardiomyopathies: Secondary | ICD-10-CM

## 2017-10-23 DIAGNOSIS — Z01818 Encounter for other preprocedural examination: Secondary | ICD-10-CM

## 2017-10-23 NOTE — Patient Instructions (Addendum)
Hold all diuretics (fluid pills) for the next 2 days and call the advanced heart failure clinic tomorrow to arrange for follow up blood pressure check this week.  Continue all other previous medications without any changes at this time.  Check your weight on a regular basis and keep a log for your records.  Look for signs of fluid overload such as worsening swelling of your lower legs, increased shortness of breath with activity, and/or a dry nonproductive cough.  Discussed these findings with your cardiologist including whether or not you should adjust your fluid pill dosage (diuretic).

## 2017-10-23 NOTE — Progress Notes (Signed)
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   She was last seen in our office on Sep 29, 2017 at which time we discussed the relatively high risks associated with conventional surgical aortic valve replacement and discussed treatment alternatives including transcatheter aortic valve replacement.  She returns to the office today with her brother present to discuss treatment options further.  She states that over the last 2 weeks she has completely lost her appetite and been feeling poorly.  She denies any fevers, chills, or productive cough.  She has not had any shortness of breath.  She has no dysuria or urinary urgency.  She states that she thinks her weight has been stable.  She has not had swelling.  The remainder of her review of systems is unchanged from previously.   Current Outpatient Medications  Medication Sig Dispense Refill  . acetaminophen (TYLENOL) 325 MG tablet Take 650 mg every 6 (six) hours as needed by mouth for mild pain.    [name] albuterol (PROVENTIL HFA;VENTOLIN HFA) 108 (90 BASE) MCG/ACT inhaler Inhale 2 puffs into the lungs every 6 (six) hours as needed for shortness of breath.     [name] aspirin 81 MG chewable tablet Chew 1 tablet (81 mg total) by mouth daily. 30 tablet 3  . beclomethasone (QVAR) 40 MCG/ACT inhaler Inhale 2 puffs into the lungs daily  as needed (shortness of breath).     . carvedilol (COREG) 12.5 MG tablet TAKE 1 TABLET BY MOUTH TWICE A DAY WITH MEALS 60 tablet 2  . furosemide (LASIX) 40 MG tablet Take 2 tablets (80 mg total) by mouth daily. 60 tablet 3  . levothyroxine (SYNTHROID, LEVOTHROID) 125 MCG tablet Take 125 mcg daily by mouth.  0  . potassium chloride (K-DUR) 10 MEQ tablet Take 2 tablets (20 mEq total) by mouth daily. 60 tablet 3  . ranitidine (ZANTAC) 150 MG tablet Take 150 mg by mouth 2 (two) times daily.  0  . rosuvastatin (CRESTOR) 20 MG tablet Take 1 tablet (20 mg total) by mouth daily. 90 tablet 3  . sacubitril-valsartan (ENTRESTO) 49-51 MG Take 1 tablet by mouth 2 (two) times daily. 60 tablet 3  . spironolactone (ALDACTONE) 25 MG tablet Take 1 tablet (25 mg total) by mouth daily. 30 tablet 3   No current facility-administered medications for this visit.       Physical Exam:   BP (!) 86/60 (BP Location: Left Arm, Patient Position: Sitting, Cuff Size: Large)   Pulse 61   Resp 18   Ht 4\' 9"  (1.448 m)   Wt 195 lb 9.6 oz (88.7 kg)   LMP 03/06/2012   SpO2 95% Comment: RA  BMI 42.33 kg/m   General:  Obese but well-appearing  Chest:   Clear to auscultation  CV:   Regular rate and rhythm  Incisions:  n/a  Abdomen:  Soft nontender  Extremities:  Warm and well-perfused, no edema  Diagnostic Tests:  n/a   Impression:  Patient has long-standing rheumatic valvular heart disease with chronic combined systolic and diastolic congestive heart failure status post aortic valve repair and mitral valve repair in 2010.She developed moderate aortic stenosis with severe aortic insufficiencyfor which shepresented more than 1 year ago with anacute exacerbation ofCHF.At that time she had severely depressed left ventricular systolic function and severe mitral regurgitation. Remarkably, shehas been doing much better for the past year on medical therapy with close follow-up through the advanced heart failure  clinic. Follow-up echocardiograms have documented continued gradual improvement in left ventricular systolic function and resolution of mitral regurgitation. Recently the patient has remained quite stable on her current medical regimen, with symptoms of exertional shortness of breath occurring only with more strenuous physical activity.  However, she complains that over the past 2 weeks she has had a very poor appetite and not been eating well.  She has not had shortness of breath.  She has felt a little bit dizzy when she stands up.  She has no other  constitutional symptoms.  Her blood pressure is somewhat low in the office today.  The patient'srecent follow-up transthoracic and transesophageal echocardiograms have been reviewed with a multidisciplinary team of specialists.  TEE demonstrates some further improvement in global left ventricular systolic function but mild to moderate aortic stenosis with severe aortic insufficiency.    Peak velocity across aortic valve measured greater than or equal to 3 m/s, although flows across the valve are probably elevated due to the underlying severe aortic insufficiency.  Her mitral valve repair appears intact and there is only trivial residual mitral regurgitation.Mean transvalvular gradient across the mitral valve estimated only 5 mmHg.  We do not feel that redo mitral valve repair or replacement is indicated.    Options include continued medical therapy with close follow-up versus consideration of redo aortic valve replacement. The patient is remarkably stable at this time, but her aortic insufficiency is quite severe and there is no question that her long-term prognosis would remain guarded at best without surgical intervention.  Risks associated with conventional surgical aortic valve replacement would unquestionably be relatively high because of the patient's previous surgery, her numerous comorbid medical problems including significant left ventricular  systolic dysfunction, and the presence of relatively small size aortic root which might necessitate need for aortic root enlargement or replacement in order to avoid the potential for creating patient prosthesis mismatch.  Cardiac-gated CTA of the heart reveals anatomical characteristics consistent with aortic stenosis possibly suitable for treatment by transcatheter aortic valve replacement although there is essentially no annular calcification and minimal leaflet calcification in the aortic valve, the dimensions of the aortic root and coronary sinuses are relatively small, and the coronary arteries are less than 10 mm from the aortic annulus.  CTA of the aorta and iliac vessels demonstrate what appears to be adequate pelvic vascular access to facilitate a transfemoral approach.   Plan:  I have again discussed options with the patient in the office today.  We discussed alternatives including continued medical therapy, redo sternotomy for aortic valve replacement which would likely require aortic root enlargement or root replacement, and transcatheter aortic valve replacement.  We discussed concerns regarding her long-term prognosis without surgical intervention on continued medical therapy.  We discussed the very high risks associated with conventional surgical aortic valve replacement.  Finally, we discussed issues specific to transcatheter aortic valve replacement  including questions about long term valve durability, the potential for paravalvular leak, possible increased risk of need for permanent pacemaker placement, and other technical complications related to the procedure itself including acute coronary occlusion and/or valve embolization.    The patient is very interested in proceeding with transcatheter aortic valve replacement at some point in the near future.  However, because of her recent symptoms of anorexia and dizziness associated with borderline low blood pressure, we will hold off on  scheduling surgery at this time.  We will make arrangements for the patient to be seen in follow-up in the advanced heart failure clinic later this week.  Once her condition has been stabilized we will make final plans for transcatheter aortic valve replacement.  All her questions have been addressed.   I spent in excess of 15 minutes during the conduct of this office consultation and >50% of this time involved direct face-to-face encounter with the patient for counseling and/or coordination of their care.    Valentina Gu. Roxy Manns, MD 10/23/2017 5:06 PM

## 2017-10-25 NOTE — Progress Notes (Signed)
She does for 6/21

## 2017-10-27 ENCOUNTER — Other Ambulatory Visit: Payer: Self-pay

## 2017-10-27 DIAGNOSIS — I350 Nonrheumatic aortic (valve) stenosis: Secondary | ICD-10-CM

## 2017-10-27 DIAGNOSIS — R42 Dizziness and giddiness: Secondary | ICD-10-CM

## 2017-10-27 NOTE — Progress Notes (Signed)
Appt. 6/21

## 2017-11-09 ENCOUNTER — Telehealth (HOSPITAL_COMMUNITY): Payer: Self-pay

## 2017-11-09 ENCOUNTER — Encounter: Payer: Medicaid Other | Admitting: Surgery

## 2017-11-09 NOTE — Telephone Encounter (Signed)
Called to set up appt for pre TAVR eval. Left message to call back.

## 2017-11-09 NOTE — Progress Notes (Signed)
See telephone note.

## 2017-11-10 ENCOUNTER — Ambulatory Visit (HOSPITAL_COMMUNITY)
Admission: RE | Admit: 2017-11-10 | Discharge: 2017-11-10 | Disposition: A | Discharge status: Home / Self Care | Payer: Medicaid Other | Source: Ambulatory Visit | Attending: Thoracic Surgery (Cardiothoracic Vascular Surgery) | Admitting: Thoracic Surgery (Cardiothoracic Vascular Surgery)

## 2017-11-10 ENCOUNTER — Ambulatory Visit (HOSPITAL_COMMUNITY)
Admission: RE | Admit: 2017-11-10 | Discharge: 2017-11-10 | Disposition: A | Discharge status: Home / Self Care | Payer: Medicaid Other | Source: Ambulatory Visit | Attending: Cardiology | Admitting: Cardiology

## 2017-11-10 ENCOUNTER — Ambulatory Visit (HOSPITAL_COMMUNITY): Admission: RE | Admit: 2017-11-10 | Payer: Medicaid Other | Source: Ambulatory Visit

## 2017-11-10 VITALS — BP 93/48 | HR 57 | Wt 195.8 lb

## 2017-11-10 DIAGNOSIS — Z7951 Long term (current) use of inhaled steroids: Secondary | ICD-10-CM | POA: Diagnosis not present

## 2017-11-10 DIAGNOSIS — I5022 Chronic systolic (congestive) heart failure: Secondary | ICD-10-CM | POA: Insufficient documentation

## 2017-11-10 DIAGNOSIS — I080 Rheumatic disorders of both mitral and aortic valves: Secondary | ICD-10-CM | POA: Insufficient documentation

## 2017-11-10 DIAGNOSIS — Z79890 Hormone replacement therapy (postmenopausal): Secondary | ICD-10-CM | POA: Insufficient documentation

## 2017-11-10 DIAGNOSIS — E785 Hyperlipidemia, unspecified: Secondary | ICD-10-CM | POA: Insufficient documentation

## 2017-11-10 DIAGNOSIS — K5900 Constipation, unspecified: Secondary | ICD-10-CM | POA: Diagnosis not present

## 2017-11-10 DIAGNOSIS — N183 Chronic kidney disease, stage 3 (moderate): Secondary | ICD-10-CM | POA: Insufficient documentation

## 2017-11-10 DIAGNOSIS — Z87891 Personal history of nicotine dependence: Secondary | ICD-10-CM | POA: Insufficient documentation

## 2017-11-10 DIAGNOSIS — I429 Cardiomyopathy, unspecified: Secondary | ICD-10-CM | POA: Insufficient documentation

## 2017-11-10 DIAGNOSIS — R42 Dizziness and giddiness: Secondary | ICD-10-CM

## 2017-11-10 DIAGNOSIS — I130 Hypertensive heart and chronic kidney disease with heart failure and stage 1 through stage 4 chronic kidney disease, or unspecified chronic kidney disease: Secondary | ICD-10-CM | POA: Insufficient documentation

## 2017-11-10 DIAGNOSIS — I099 Rheumatic heart disease, unspecified: Secondary | ICD-10-CM | POA: Diagnosis not present

## 2017-11-10 DIAGNOSIS — Z7982 Long term (current) use of aspirin: Secondary | ICD-10-CM | POA: Insufficient documentation

## 2017-11-10 DIAGNOSIS — I272 Pulmonary hypertension, unspecified: Secondary | ICD-10-CM

## 2017-11-10 DIAGNOSIS — E039 Hypothyroidism, unspecified: Secondary | ICD-10-CM | POA: Diagnosis not present

## 2017-11-10 DIAGNOSIS — Z9889 Other specified postprocedural states: Secondary | ICD-10-CM | POA: Diagnosis not present

## 2017-11-10 DIAGNOSIS — Z79899 Other long term (current) drug therapy: Secondary | ICD-10-CM | POA: Insufficient documentation

## 2017-11-10 DIAGNOSIS — I350 Nonrheumatic aortic (valve) stenosis: Secondary | ICD-10-CM

## 2017-11-10 LAB — BASIC METABOLIC PANEL
ANION GAP: 11 (ref 5–15)
BUN: 36 mg/dL — ABNORMAL HIGH (ref 6–20)
CHLORIDE: 103 mmol/L (ref 101–111)
CO2: 23 mmol/L (ref 22–32)
CREATININE: 2.26 mg/dL — AB (ref 0.44–1.00)
Calcium: 8.5 mg/dL — ABNORMAL LOW (ref 8.9–10.3)
GFR calc non Af Amer: 23 mL/min — ABNORMAL LOW (ref >60)
GFR, EST AFRICAN AMERICAN: 27 mL/min — AB (ref >60)
Glucose, Bld: 110 mg/dL — ABNORMAL HIGH (ref 65–99)
POTASSIUM: 4.5 mmol/L (ref 3.5–5.1)
SODIUM: 137 mmol/L (ref 135–145)

## 2017-11-10 LAB — PULMONARY FUNCTION TEST
DL/VA % PRED: 104 %
DL/VA: 3.85 ml/min/mmHg/L
DLCO UNC: 10.19 ml/min/mmHg
DLCO unc % pred: 68 %
FEF 25-75 POST: 2.08 L/s
FEF 25-75 Pre: 2.27 L/sec
FEF2575-%Change-Post: -8 %
FEF2575-%PRED-POST: 119 %
FEF2575-%PRED-PRE: 130 %
FEV1-%Change-Post: 0 %
FEV1-%PRED-PRE: 90 %
FEV1-%Pred-Post: 90 %
FEV1-Post: 1.44 L
FEV1-Pre: 1.44 L
FEV1FVC-%CHANGE-POST: 0 %
FEV1FVC-%Pred-Pre: 111 %
FEV6-%CHANGE-POST: 0 %
FEV6-%Pred-Post: 83 %
FEV6-%Pred-Pre: 83 %
FEV6-POST: 1.61 L
FEV6-Pre: 1.62 L
FEV6FVC-%PRED-POST: 104 %
FEV6FVC-%Pred-Pre: 104 %
FVC-%Change-Post: 0 %
FVC-%PRED-PRE: 80 %
FVC-%Pred-Post: 80 %
FVC-POST: 1.61 L
FVC-PRE: 1.62 L
POST FEV6/FVC RATIO: 100 %
PRE FEV1/FVC RATIO: 89 %
Post FEV1/FVC ratio: 90 %
Pre FEV6/FVC Ratio: 100 %
RV % pred: 142 %
RV: 2.22 L
TLC % PRED: 98 %
TLC: 3.97 L

## 2017-11-10 MED ORDER — ALBUTEROL SULFATE (2.5 MG/3ML) 0.083% IN NEBU
2.5000 mg | INHALATION_SOLUTION | Freq: Once | RESPIRATORY_TRACT | Status: AC
  Administered 2017-11-10: 2.5 mg via RESPIRATORY_TRACT

## 2017-11-10 MED ORDER — SACUBITRIL-VALSARTAN 24-26 MG PO TABS: 1.0000 | ORAL_TABLET | Freq: Two times a day (BID) | ORAL | 3 refills | Status: DC

## 2017-11-10 MED ORDER — DOCUSATE SODIUM 100 MG PO CAPS: 100.0000 mg | ORAL_CAPSULE | Freq: Two times a day (BID) | ORAL | 3 refills | Status: DC

## 2017-11-10 NOTE — Patient Instructions (Addendum)
Decrease Entresto 24/26 (1 tab), twice a day  Start Colace 100 mg (1 tab), twice day  Labs drawn today (if we do not call you, then your lab work was stable)   Your physician recommends that you schedule a follow-up appointment in: 3 months with Dr. Aundra Dubin

## 2017-11-10 NOTE — H&P (View-Only) (Signed)
Advanced Heart Failure Clinic Note   PCP: Roe Coombs Cardiology: Dr. Ellyn Hack HF Cardiology: Dr. 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  She had aortic and mitral repairs in 2010.  Afterwards, EF was mildly low in the 40-45% range.  However, EF had dropped to 25-30% on 1/18 echo.  TEE showed severe MR, moderate AI, moderate aortic stenosis.  RHC/LHC in 2/18 showed significantly elevated LV and RV filling pressures.    At her initial appointment here, she was very dyspneic with exertion, with orthopnea and PND. Her meds were adjusted and Lasix increased.  She has seen Dr. Roxy Manns since that time for evaluation for redo valve surgery.  She would require high risk mitral and aortic valve replacements. For now, the plan will be close monitoring with surgery if starts to decline or has CHF exacerbation. CPX was done in 3/18 showing moderate to severely decreased functional capacity.  RHC in 4/18 showed optimized filling pressures but low cardiac output.  Repeat echo in 8/18 showed EF 30-35%, severe AI with moderate AS but only mild MR noted.  The RV was mildly dilated with mildly decreased systolic function.   She returns today for HF follow up. Last visit, digoxin was [facility] with improvement of EF and she was referred to Dr Roxy Manns for possible AV surgery. She had PFTs today for surgical work up. Her main complaint today is constipation and fullness. Last BM 1 week ago. She has limited activity due to low back pain, which she attributes to constipation. Denies CP, fatigue, SOB. Denies orthopnea, PND, or peripheral edema. She has mild dizziness sitting to standing quickly, which improves with walking. BP is lower today than it has been in the past.  Weights ~195 lbs at home. She is drinking a lot of water. Taking all medications.   TEE 06/28/2017: EF 45%, severe AI, mild to moderate AS, mild mitral valve stenosis s/p MV  repair  Labs (2/18): K 3.8 => 3.4, creatinine 1.09 => 1.07, hgb 12.7, BNP 1010 Labs (3/18): K 4.6, creatinine 2.43 => 1.74 Labs (4/18): K 4, creatinine 1.28, digoxin 0.3 Labs 09/2016: K 4.4, creatinine 1.26.  Labs 01/19/2017: K 4.4, Creatinine 1.21, digoxin 0.3 Labs (10/18): K 4.7, creatinine 1.10, digoxin 0.3 Labs (12/18): K 4.9, creatinine 1.34, digoxin < 0.2  PMH: 1. HTN 2. Hypothyroidism 3. Hyperlipidemia 4. Rheumatic heart disease: s/p MV repair and aortic valve repair in 2010, Dr. Roxy Manns. - TEE (2/18): EF 35-30%, dilated LV, rheumatic-appearing aortic valve s/p repair with moderate AS (AVA 1.1 cm^2), moderate AI.  MV s/p repair with mild-moderate mitral stenosis and eccentric severe MR.   - Echo (8/18): Severe AI but only moderate AS, MR was only mild on this study (may have missed full jet) with mean MV gradient 4 mmHg.  - TEE 06/28/2017: EF 45%, severe AI, mild to moderate AS, mild mitral valve stenosis s/p MV repair 5. Chronic systolic CHF: Nonischemic cardiomyopathy, probably related to valvular disease.   - Echo (11/15): EF 40-45%, - Echo (12/16): EF 35-40%. - Echo (1/18): EF 25-30%.  - LHC/RHC (2/18): Normal coronaries, mean RA 18, mean PA 74/39 mean 52, mean PCWP 31, CI 1.98 Fick. - CPX (3/18): peak VO2 11.7, VE/VCO2 slope 40, RER 1.11 => moderate to severely decreased functional capacity due to HF.  - RHC (4/18): mean RA 4, PA 44/17 mean 28, mean PCWP 15, CI 1.73, PVR  4 WU.  - Echo (8/18): EF 30-35%, severe AI with moderate AS but only mild MR noted with mean MV gradient 4 mmHg.  The RV was mildly dilated with mildly decreased systolic function. - TEE 6/37: EF 45%, severe aortic insufficiency, mild to moderate AS, mild mitral valve stenosis s/p MV repair - Coronary CTA (5/19): No obstructive coronary disease.   Social History   Socioeconomic History  . Marital status: Single    Spouse name: Not on file  . Number of children: Not on file  . Years of education: Not on file    . Highest education level: Not on file  Occupational History  . Not on file  Social Needs  . Financial resource strain: Not on file  . Food insecurity:    Worry: Not on file    Inability: Not on file  . Transportation needs:    Medical: Not on file    Non-medical: Not on file  Tobacco Use  . Smoking status: Former Research scientist (life sciences)  . Smokeless tobacco: Never Used  . Tobacco comment: quit 2011  Substance and Sexual Activity  . Alcohol use: No  . Drug use: No  . Sexual activity: Not Currently  Lifestyle  . Physical activity:    Days per week: Not on file    Minutes per session: Not on file  . Stress: Not on file  Relationships  . Social connections:    Talks on phone: Not on file    Gets together: Not on file    Attends religious service: Not on file    Active member of club or organization: Not on file    Attends meetings of clubs or organizations: Not on file    Relationship status: Not on file  . Intimate partner violence:    Fear of current or ex partner: Not on file    Emotionally abused: Not on file    Physically abused: Not on file    Forced sexual activity: Not on file  Other Topics Concern  . Not on file  Social History Narrative   Unemployed, on disability.   Now is finally reestablished with a PCP.   She is a former smoker, quit in 2011. Denies alcohol consumption   Tries to walk routinely.   Family History  Problem Relation Age of Onset  . Heart disease Mother   . CVA Mother    Review of systems complete and found to be negative unless listed in HPI.    Current Outpatient Medications  Medication Sig Dispense Refill  . acetaminophen (TYLENOL) 325 MG tablet Take 650 mg every 6 (six) hours as needed by mouth for mild pain.    [name] albuterol (PROVENTIL HFA;VENTOLIN HFA) 108 (90 BASE) MCG/ACT inhaler Inhale 2 puffs into the lungs every 6 (six) hours as needed for shortness of breath.     [name] aspirin 81 MG chewable tablet Chew 1 tablet (81 mg total) by mouth daily. 30  tablet 3  . beclomethasone (QVAR) 40 MCG/ACT inhaler Inhale 2 puffs into the lungs daily as needed (shortness of breath).     . carvedilol (COREG) 12.5 MG tablet TAKE 1 TABLET BY MOUTH TWICE A DAY WITH MEALS 60 tablet 2  . furosemide (LASIX) 40 MG tablet Take 2 tablets (80 mg total) by mouth daily. 60 tablet 3  . levothyroxine (SYNTHROID, LEVOTHROID) 125 MCG tablet Take 125 mcg daily by mouth.  0  . potassium chloride (K-DUR) 10 MEQ tablet Take 2 tablets (20 mEq total) by  mouth daily. 60 tablet 3  . rosuvastatin (CRESTOR) 20 MG tablet Take 1 tablet (20 mg total) by mouth daily. 90 tablet 3  . sacubitril-valsartan (ENTRESTO) 49-51 MG Take 1 tablet by mouth 2 (two) times daily. 60 tablet 3  . spironolactone (ALDACTONE) 25 MG tablet Take 1 tablet (25 mg total) by mouth daily. 30 tablet 3   No current facility-administered medications for this encounter.    Vitals:   11/10/17 1359  BP: (!) 93/48  Pulse: (!) 57  SpO2: 90%  Weight: 195 lb 12.8 oz (88.8 kg)    Wt Readings from Last 3 Encounters:  11/10/17 195 lb 12.8 oz (88.8 kg)  10/23/17 195 lb 9.6 oz (88.7 kg)  09/29/17 188 lb (85.3 kg)   Orthostatics:  Sitting: 86/62 Standing: 84/60  Physical Exam  General:  No resp difficulty. HEENT: Normal Neck: Supple. No JVD. Carotids 2+ bilat; no bruits. No thyromegaly or nodule noted. Cor: PMI nondisplaced. RRR, 2/6 crescendo-decrescendo systolic murmur RUSB, 2/6 diastolic murmur at RUSB Lungs: CTAB, normal effort. Abdomen: Soft, non-tender, non-distended, no HSM. No bruits or masses. +BS  Extremities: No cyanosis, clubbing, or rash. R and LLE no edema.  Neuro: Alert & orientedx3, cranial nerves grossly intact. moves all 4 extremities w/o difficulty. Affect pleasant   Assessment/Plan: 1. 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  She had aortic and mitral valve repairs in 2010.   It was felt that she would not be a very good Mitraclip candidate as she already has a degree of mitral  stenosis.  TEE in 2/18 showed moderate AS/moderate AI , mild to moderate MS, severe eccentric MR.  Echo in 8/18 showed moderate AS/severe AI but only mild mitral stenosis and mild MR noted. Hemodynamics looked better on 4/18 RHC though cardiac output still marginal.  Repeat TEE 06/28/2017: EF 45%, severe aortic insufficiency, mild to moderate AS, mild mitral valve stenosis s/p MV repair with trivial MR.  - Plans for TAVR on July 2 with Dr Roxy Manns. PFTs completed today. TAVR CT complete. Pt says pre-op cartoid [place] has not yet been approved by medicaid.  2. Chronic systolic CHF:  Nonischemic cardiomyopathy (no significant disease on 2/18 LHC).  It is possible that this cardiomyopathy is driven by her valvular disease. Echo (8/18) with EF 30-35% RV mildly dilated. TEE 06/28/2017: EF 45%, severe aortic insufficiency, mild to moderate AS, mild mitral valve stenosis s/p MV repair with only trivial MR.  - NYHA class II symptoms. Volume stable.  - Continue lasix 80 mg daily + 20 meq K. - Decrease Entresto to 24/26 mg BID with hypotension and dizziness. Orthostatics are negative.  - Bidil stopped with hypotension - Continue spironolactone 25 mg daily.  - Not an ICD candidate with EF improvement to 45% 3. CKD stage II-III:  - BMET today.  4. Constipation - Start colace 100 mg BID  BMET Decrease Entresto to 24/26 BID Colace 100 mg BID Follow up in 2 months  Georgiana Shore, NP 11/10/2017  Patient seen with NP, agree with the above note.  She denies dyspnea today.  She has been mildly lightheaded when standing recently and BP is low today.  She was not orthostatic when checked. She is not volume overloaded on exam.  - Decrease Entresto to 24/26 bid.  - BMET today, if creatinine has risen will need to cut back on Lasix => BMET showed creatinine up to 2.26. I will have her hold Entresto for 2 days then restart lower dose.  She will hold  Lasix x 2 days then restart 40 mg daily.  Repeat BMET next week.   She is  planned for TAVR soon.  CTA chest in 5/19 showed minimal CAD.   Loralie Champagne 11/12/2017

## 2017-11-10 NOTE — Progress Notes (Signed)
Advanced Heart Failure Clinic Note   PCP: Roe Coombs Cardiology: Dr. Ellyn Hack HF Cardiology: Dr. 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  She had aortic and mitral repairs in 2010.  Afterwards, EF was mildly low in the 40-45% range.  However, EF had dropped to 25-30% on 1/18 echo.  TEE showed severe MR, moderate AI, moderate aortic stenosis.  RHC/LHC in 2/18 showed significantly elevated LV and RV filling pressures.    At her initial appointment here, she was very dyspneic with exertion, with orthopnea and PND. Her meds were adjusted and Lasix increased.  She has seen Dr. Roxy Manns since that time for evaluation for redo valve surgery.  She would require high risk mitral and aortic valve replacements. For now, the plan will be close monitoring with surgery if starts to decline or has CHF exacerbation. CPX was done in 3/18 showing moderate to severely decreased functional capacity.  RHC in 4/18 showed optimized filling pressures but low cardiac output.  Repeat echo in 8/18 showed EF 30-35%, severe AI with moderate AS but only mild MR noted.  The RV was mildly dilated with mildly decreased systolic function.   She returns today for HF follow up. Last visit, digoxin was [facility] with improvement of EF and she was referred to Dr Roxy Manns for possible AV surgery. She had PFTs today for surgical work up. Her main complaint today is constipation and fullness. Last BM 1 week ago. She has limited activity due to low back pain, which she attributes to constipation. Denies CP, fatigue, SOB. Denies orthopnea, PND, or peripheral edema. She has mild dizziness sitting to standing quickly, which improves with walking. BP is lower today than it has been in the past.  Weights ~195 lbs at home. She is drinking a lot of water. Taking all medications.   TEE 06/28/2017: EF 45%, severe AI, mild to moderate AS, mild mitral valve stenosis s/p MV  repair  Labs (2/18): K 3.8 => 3.4, creatinine 1.09 => 1.07, hgb 12.7, BNP 1010 Labs (3/18): K 4.6, creatinine 2.43 => 1.74 Labs (4/18): K 4, creatinine 1.28, digoxin 0.3 Labs 09/2016: K 4.4, creatinine 1.26.  Labs 01/19/2017: K 4.4, Creatinine 1.21, digoxin 0.3 Labs (10/18): K 4.7, creatinine 1.10, digoxin 0.3 Labs (12/18): K 4.9, creatinine 1.34, digoxin < 0.2  PMH: 1. HTN 2. Hypothyroidism 3. Hyperlipidemia 4. Rheumatic heart disease: s/p MV repair and aortic valve repair in 2010, Dr. Roxy Manns. - TEE (2/18): EF 35-30%, dilated LV, rheumatic-appearing aortic valve s/p repair with moderate AS (AVA 1.1 cm^2), moderate AI.  MV s/p repair with mild-moderate mitral stenosis and eccentric severe MR.   - Echo (8/18): Severe AI but only moderate AS, MR was only mild on this study (may have missed full jet) with mean MV gradient 4 mmHg.  - TEE 06/28/2017: EF 45%, severe AI, mild to moderate AS, mild mitral valve stenosis s/p MV repair 5. Chronic systolic CHF: Nonischemic cardiomyopathy, probably related to valvular disease.   - Echo (11/15): EF 40-45%, - Echo (12/16): EF 35-40%. - Echo (1/18): EF 25-30%.  - LHC/RHC (2/18): Normal coronaries, mean RA 18, mean PA 74/39 mean 52, mean PCWP 31, CI 1.98 Fick. - CPX (3/18): peak VO2 11.7, VE/VCO2 slope 40, RER 1.11 => moderate to severely decreased functional capacity due to HF.  - RHC (4/18): mean RA 4, PA 44/17 mean 28, mean PCWP 15, CI 1.73, PVR  4 WU.  - Echo (8/18): EF 30-35%, severe AI with moderate AS but only mild MR noted with mean MV gradient 4 mmHg.  The RV was mildly dilated with mildly decreased systolic function. - TEE 5/95: EF 45%, severe aortic insufficiency, mild to moderate AS, mild mitral valve stenosis s/p MV repair - Coronary CTA (5/19): No obstructive coronary disease.   Social History   Socioeconomic History  . Marital status: Single    Spouse name: Not on file  . Number of children: Not on file  . Years of education: Not on file    . Highest education level: Not on file  Occupational History  . Not on file  Social Needs  . Financial resource strain: Not on file  . Food insecurity:    Worry: Not on file    Inability: Not on file  . Transportation needs:    Medical: Not on file    Non-medical: Not on file  Tobacco Use  . Smoking status: Former Research scientist (life sciences)  . Smokeless tobacco: Never Used  . Tobacco comment: quit 2011  Substance and Sexual Activity  . Alcohol use: No  . Drug use: No  . Sexual activity: Not Currently  Lifestyle  . Physical activity:    Days per week: Not on file    Minutes per session: Not on file  . Stress: Not on file  Relationships  . Social connections:    Talks on phone: Not on file    Gets together: Not on file    Attends religious service: Not on file    Active member of club or organization: Not on file    Attends meetings of clubs or organizations: Not on file    Relationship status: Not on file  . Intimate partner violence:    Fear of current or ex partner: Not on file    Emotionally abused: Not on file    Physically abused: Not on file    Forced sexual activity: Not on file  Other Topics Concern  . Not on file  Social History Narrative   Unemployed, on disability.   Now is finally reestablished with a PCP.   She is a former smoker, quit in 2011. Denies alcohol consumption   Tries to walk routinely.   Family History  Problem Relation Age of Onset  . Heart disease Mother   . CVA Mother    Review of systems complete and found to be negative unless listed in HPI.    Current Outpatient Medications  Medication Sig Dispense Refill  . acetaminophen (TYLENOL) 325 MG tablet Take 650 mg every 6 (six) hours as needed by mouth for mild pain.    [name] albuterol (PROVENTIL HFA;VENTOLIN HFA) 108 (90 BASE) MCG/ACT inhaler Inhale 2 puffs into the lungs every 6 (six) hours as needed for shortness of breath.     [name] aspirin 81 MG chewable tablet Chew 1 tablet (81 mg total) by mouth daily. 30  tablet 3  . beclomethasone (QVAR) 40 MCG/ACT inhaler Inhale 2 puffs into the lungs daily as needed (shortness of breath).     . carvedilol (COREG) 12.5 MG tablet TAKE 1 TABLET BY MOUTH TWICE A DAY WITH MEALS 60 tablet 2  . furosemide (LASIX) 40 MG tablet Take 2 tablets (80 mg total) by mouth daily. 60 tablet 3  . levothyroxine (SYNTHROID, LEVOTHROID) 125 MCG tablet Take 125 mcg daily by mouth.  0  . potassium chloride (K-DUR) 10 MEQ tablet Take 2 tablets (20 mEq total) by  mouth daily. 60 tablet 3  . rosuvastatin (CRESTOR) 20 MG tablet Take 1 tablet (20 mg total) by mouth daily. 90 tablet 3  . sacubitril-valsartan (ENTRESTO) 49-51 MG Take 1 tablet by mouth 2 (two) times daily. 60 tablet 3  . spironolactone (ALDACTONE) 25 MG tablet Take 1 tablet (25 mg total) by mouth daily. 30 tablet 3   No current facility-administered medications for this encounter.    Vitals:   11/10/17 1359  BP: (!) 93/48  Pulse: (!) 57  SpO2: 90%  Weight: 195 lb 12.8 oz (88.8 kg)    Wt Readings from Last 3 Encounters:  11/10/17 195 lb 12.8 oz (88.8 kg)  10/23/17 195 lb 9.6 oz (88.7 kg)  09/29/17 188 lb (85.3 kg)   Orthostatics:  Sitting: 86/62 Standing: 84/60  Physical Exam  General:  No resp difficulty. HEENT: Normal Neck: Supple. No JVD. Carotids 2+ bilat; no bruits. No thyromegaly or nodule noted. Cor: PMI nondisplaced. RRR, 2/6 crescendo-decrescendo systolic murmur RUSB, 2/6 diastolic murmur at RUSB Lungs: CTAB, normal effort. Abdomen: Soft, non-tender, non-distended, no HSM. No bruits or masses. +BS  Extremities: No cyanosis, clubbing, or rash. R and LLE no edema.  Neuro: Alert & orientedx3, cranial nerves grossly intact. moves all 4 extremities w/o difficulty. Affect pleasant   Assessment/Plan: 1. 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  She had aortic and mitral valve repairs in 2010.   It was felt that she would not be a very good Mitraclip candidate as she already has a degree of mitral  stenosis.  TEE in 2/18 showed moderate AS/moderate AI , mild to moderate MS, severe eccentric MR.  Echo in 8/18 showed moderate AS/severe AI but only mild mitral stenosis and mild MR noted. Hemodynamics looked better on 4/18 RHC though cardiac output still marginal.  Repeat TEE 06/28/2017: EF 45%, severe aortic insufficiency, mild to moderate AS, mild mitral valve stenosis s/p MV repair with trivial MR.  - Plans for TAVR on July 2 with Dr Roxy Manns. PFTs completed today. TAVR CT complete. Pt says pre-op cartoid [place] has not yet been approved by medicaid.  2. Chronic systolic CHF:  Nonischemic cardiomyopathy (no significant disease on 2/18 LHC).  It is possible that this cardiomyopathy is driven by her valvular disease. Echo (8/18) with EF 30-35% RV mildly dilated. TEE 06/28/2017: EF 45%, severe aortic insufficiency, mild to moderate AS, mild mitral valve stenosis s/p MV repair with only trivial MR.  - NYHA class II symptoms. Volume stable.  - Continue lasix 80 mg daily + 20 meq K. - Decrease Entresto to 24/26 mg BID with hypotension and dizziness. Orthostatics are negative.  - Bidil stopped with hypotension - Continue spironolactone 25 mg daily.  - Not an ICD candidate with EF improvement to 45% 3. CKD stage II-III:  - BMET today.  4. Constipation - Start colace 100 mg BID  BMET Decrease Entresto to 24/26 BID Colace 100 mg BID Follow up in 2 months  Georgiana Shore, NP 11/10/2017  Patient seen with NP, agree with the above note.  She denies dyspnea today.  She has been mildly lightheaded when standing recently and BP is low today.  She was not orthostatic when checked. She is not volume overloaded on exam.  - Decrease Entresto to 24/26 bid.  - BMET today, if creatinine has risen will need to cut back on Lasix => BMET showed creatinine up to 2.26. I will have her hold Entresto for 2 days then restart lower dose.  She will hold  Lasix x 2 days then restart 40 mg daily.  Repeat BMET next week.   She is  planned for TAVR soon.  CTA chest in 5/19 showed minimal CAD.   Loralie Champagne 11/12/2017

## 2017-11-13 ENCOUNTER — Encounter: Payer: Self-pay | Admitting: Physician Assistant

## 2017-11-14 ENCOUNTER — Telehealth (HOSPITAL_COMMUNITY): Payer: Self-pay

## 2017-11-14 ENCOUNTER — Other Ambulatory Visit (HOSPITAL_COMMUNITY): Payer: Self-pay

## 2017-11-14 ENCOUNTER — Encounter: Payer: Medicaid Other | Admitting: Surgery

## 2017-11-14 ENCOUNTER — Other Ambulatory Visit: Payer: Self-pay

## 2017-11-14 DIAGNOSIS — I351 Nonrheumatic aortic (valve) insufficiency: Secondary | ICD-10-CM

## 2017-11-14 DIAGNOSIS — I5042 Chronic combined systolic (congestive) and diastolic (congestive) heart failure: Secondary | ICD-10-CM

## 2017-11-14 MED ORDER — POTASSIUM CHLORIDE ER 10 MEQ PO TBCR: 20.0000 meq | EXTENDED_RELEASE_TABLET | Freq: Every day | ORAL | 3 refills | Status: DC

## 2017-11-14 MED ORDER — FUROSEMIDE 40 MG PO TABS: 40.0000 mg | ORAL_TABLET | Freq: Every day | ORAL | 11 refills | Status: DC

## 2017-11-14 NOTE — Addendum Note (Signed)
Addended by: Effie Berkshire on: 11/14/2017 01:11 PM   Modules accepted: Orders

## 2017-11-14 NOTE — Telephone Encounter (Signed)
Per Dr. Delman Cheadle request and per Dr. Claris Gladden lab note, left VM on patient's line to call back to reschedule Friday's repeat bmet appt to tomorrow (Wednesday the 26th).  Renee Pain, RN

## 2017-11-14 NOTE — Telephone Encounter (Signed)
Patient returning call to CHF clinic for lab appointment.  Scheduled 1 wk repeat bmet per Aundra Dubin this Friday while patient here for vascular studies.  Also review med changes and instructions. Nothing further needed, patient aware, agreeable, and appreciative.  Renee Pain, RN

## 2017-11-14 NOTE — Telephone Encounter (Signed)
Spoke w/pt, she is unable to come in tomorrow for lab work due to transportation, she is already sch to be at our office on Fri and will have labs done at that time.  She states she is feeling better since decreasing meds and is no longer having any dizziness.

## 2017-11-15 NOTE — Addendum Note (Signed)
Addended by: Scarlette Calico on: 11/15/2017 09:54 AM   Modules accepted: Orders

## 2017-11-15 NOTE — Telephone Encounter (Signed)
We will get labs done while pt is at Dr Guy Sandifer office tomorrow and run them stat so he will have results.

## 2017-11-16 ENCOUNTER — Encounter: Payer: Self-pay | Admitting: Physical Therapy

## 2017-11-16 ENCOUNTER — Encounter: Payer: Self-pay | Admitting: Thoracic Surgery (Cardiothoracic Vascular Surgery)

## 2017-11-16 ENCOUNTER — Encounter: Payer: Medicaid Other | Admitting: Surgery

## 2017-11-16 ENCOUNTER — Ambulatory Visit (INDEPENDENT_AMBULATORY_CARE_PROVIDER_SITE_OTHER): Payer: Medicaid Other | Admitting: Thoracic Surgery (Cardiothoracic Vascular Surgery)

## 2017-11-16 ENCOUNTER — Ambulatory Visit: Payer: Medicaid Other | Admitting: Physical Therapy

## 2017-11-16 ENCOUNTER — Ambulatory Visit: Payer: Medicaid Other | Attending: Thoracic Surgery (Cardiothoracic Vascular Surgery) | Admitting: Physical Therapy

## 2017-11-16 ENCOUNTER — Ambulatory Visit (HOSPITAL_COMMUNITY)
Admission: RE | Admit: 2017-11-16 | Discharge: 2017-11-16 | Disposition: A | Discharge status: Home / Self Care | Payer: Medicaid Other | Source: Ambulatory Visit | Attending: Internal Medicine | Admitting: Internal Medicine

## 2017-11-16 ENCOUNTER — Other Ambulatory Visit: Payer: Self-pay

## 2017-11-16 VITALS — BP 102/70 | HR 55 | Resp 18 | Ht <= 58 in | Wt 195.0 lb

## 2017-11-16 DIAGNOSIS — I5042 Chronic combined systolic (congestive) and diastolic (congestive) heart failure: Secondary | ICD-10-CM

## 2017-11-16 DIAGNOSIS — I099 Rheumatic heart disease, unspecified: Secondary | ICD-10-CM

## 2017-11-16 DIAGNOSIS — I428 Other cardiomyopathies: Secondary | ICD-10-CM

## 2017-11-16 DIAGNOSIS — I062 Rheumatic aortic stenosis with insufficiency: Secondary | ICD-10-CM

## 2017-11-16 DIAGNOSIS — I351 Nonrheumatic aortic (valve) insufficiency: Secondary | ICD-10-CM | POA: Insufficient documentation

## 2017-11-16 DIAGNOSIS — Z9889 Other specified postprocedural states: Secondary | ICD-10-CM | POA: Diagnosis not present

## 2017-11-16 DIAGNOSIS — I440 Atrioventricular block, first degree: Secondary | ICD-10-CM | POA: Insufficient documentation

## 2017-11-16 DIAGNOSIS — I252 Old myocardial infarction: Secondary | ICD-10-CM | POA: Insufficient documentation

## 2017-11-16 DIAGNOSIS — R293 Abnormal posture: Secondary | ICD-10-CM | POA: Diagnosis present

## 2017-11-16 DIAGNOSIS — R2689 Other abnormalities of gait and mobility: Secondary | ICD-10-CM | POA: Diagnosis not present

## 2017-11-16 NOTE — Pre-Procedure Instructions (Signed)
[name]  11/16/2017      [id number] - [place], [address] AT [address] [place] [address] Phone: [phone] Fax: [phone]    Your procedure is scheduled on Tuesday, July 2nd   Report to [facility] Admitting at 8:15 AM             (posted surgery time 10:15a - 12:34p)   Call this number if you have problems the morning of surgery:  [phone]   Remember:   Do not eat any food  or drink any liquids after midnight, Monday.              7 days prior to surgery, STOP TAKING any Vitamins, Herbal Supplements, Anti-inflammatories   Take these medicines the morning of surgery with A SIP OF WATER : Levothyroxine.  Please use your inhalers.    Do not wear jewelry, make-up or nail polish.  Do not wear lotions, powders, perfumes, or deodorant.  Do not shave 48 hours prior to surgery.    Do not bring valuables to the hospital.  [facility] is not responsible for any belongings or valuables.  Contacts, dentures or bridgework may not be worn into surgery.  Leave your suitcase in the car.  After surgery it may be brought to your room.  For patients admitted to the hospital, discharge time will be determined by your treatment team.  Please read over the following fact sheets that you were given. Pain Booklet, MRSA Information and Surgical Site Infection Prevention     [place]- Preparing For Surgery  Before surgery, you can play an important role. Because skin is not sterile, your skin needs to be as free of germs as possible. You can reduce the number of germs on your skin by washing with CHG (chlorahexidine gluconate) Soap before surgery.  CHG is an antiseptic cleaner which kills germs and bonds with the skin to continue killing germs even after washing.    Oral Hygiene is also important to reduce your risk of infection.    Remember - BRUSH YOUR TEETH THE MORNING OF  SURGERY WITH YOUR REGULAR TOOTHPASTE  Please do not use if you have an allergy to CHG or antibacterial soaps. If your skin becomes reddened/irritated stop using the CHG.  Do not shave (including legs and underarms) for at least 48 hours prior to first CHG shower. It is OK to shave your face.  Please follow these instructions carefully.   1. Shower the NIGHT BEFORE SURGERY and the MORNING OF SURGERY with CHG.   2. If you chose to wash your hair, wash your hair first as usual with your normal shampoo.  3. After you shampoo, rinse your hair and body thoroughly to remove the shampoo.  4. Use CHG as you would any other liquid soap. You can apply CHG directly to the skin and wash gently with a scrungie or a clean washcloth.   5. Apply the CHG Soap to your body ONLY FROM THE NECK DOWN.  Do not use on open wounds or open sores. Avoid contact with your eyes, ears, mouth and genitals (private parts). Wash Face and genitals (private parts)  with your normal soap.  6. Wash thoroughly, paying special attention to the area where your surgery will be performed.  7. Thoroughly rinse your body with warm water from the neck down.  8. DO NOT shower/wash with your normal soap after using and  rinsing off the CHG Soap.  9. Pat yourself dry with a CLEAN TOWEL.  10. Wear CLEAN PAJAMAS to bed the night before surgery, wear comfortable clothes the morning of surgery  11. Place CLEAN SHEETS on your bed the night of your first shower and DO NOT SLEEP WITH PETS.  Day of Surgery:  Do not apply any deodorants/lotions.  Please wear clean clothes to the hospital/surgery center.    Remember to brush your teeth WITH YOUR REGULAR TOOTHPASTE.

## 2017-11-16 NOTE — Therapy (Signed)
[place], [place], [address] Phone: [phone]   Fax:  [phone]  Physical Therapy Evaluation  Patient Details  Name: [name] MRN: [id number] Date of Birth: [birth year] Referring Provider: Dr. Darylene Price    Encounter Date: 11/16/2017  PT End of Session - 11/16/17 1326    Visit Number  1    PT Start Time  1325    PT Stop Time  1418    PT Time Calculation (min)  53 min       Past Medical History:  Diagnosis Date  . Chronic combined systolic and diastolic CHF (congestive heart failure) (HCC)    EF now down to 25-30%. LVEDP on cath was 30 mmHg with wedge pressure of 31 mmHg.  [name] Dementia    Early onset  . Dysthymia   . Essential hypertension 12/16/2013  . History of GI bleeding   . Hx of thyroid cancer 12/16/2013   Status post thyroidectomy  . Hyperlipidemia with target LDL less than 100 12/16/2013  . Hypothyroidism 12/16/2013  . 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 Morbid obesity ([place])   . Post-menopausal bleeding 12/05/2011  . Pulmonary hypertension ([place]) 05/2016   By cardiac catheterization: mean RA 18, RV pressure/EDP: 70/12/18 mmHg.  mean PA 74/39 mean 52, mean PCWP 31 (with V wave of 45 mmHg), LVEDP ~ 30 mmHg.  [name] RAD (reactive airway disease)   . 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 Roxy Manns  Echo 12/2010: EF 40-45%, inferior hypokinesis; Slow progression of valvular Dz & drop in EF --> 05/2016: EF 25-30% (down from 40-45% post-op) with Mod AS/AI, mild MS & Severe MR (Cath & TEE 06/2016)   . S/P aortic valve repair 62/83/1517   suture plication of 3 commissures  . S/P MVR (mitral valve repair) 12/23/2008   26 mm Sorin MEMO 3D Ring Annuloplasty  . 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 Progressive worsening of aortic and mitral  valve disease.    Past Surgical History:  Procedure Laterality Date  . AORTIC VALVE REPAIR  August 6160   Suture plication of all 3 commissures  . CARDIAC CATHETERIZATION  August 2010   Preop: nonobstructive coronary disease  . CARDIAC SURGERY  12/2008   Aortic & Mitral Valve Repair (Dr. Roxy Manns)  . CESAREAN SECTION    . MITRAL VALVE REPAIR  August 2010    26 mm Sorin [facility] 3D Ring Annuloplasty  . RIGHT HEART CATH N/A 08/30/2016   Procedure: Right Heart Cath;  Surgeon: Larey Dresser, MD;  Location: [place] CV LAB;  Service: Cardiovascular;  Laterality: N/A;  . RIGHT/LEFT HEART CATH AND CORONARY ANGIOGRAPHY N/A 07/08/2016   Procedure: Right/Left Heart Cath and Coronary Angiography;  Surgeon: Leonie Man, MD;  Location: [place] [facility];  Service: Cardiovascular: Normal coronaries, mean RA 18, RV pressure/EDP: 70/12/18 mmHg.  mean PA 74/39 mean 52, mean PCWP 31 (with V wave of 45 mmHg), LVEDP ~ 30 mmHg.  CI 1.98 Fick. Peak AoV gradient ~15 mHg    . TEE WITHOUT CARDIOVERSION N/A 07/13/2016   Procedure: Transesophageal Echocardiogram (TEE);  Surgeon: [name], MD;  Location: [facility] ENDOSCOPY;  Service: Cardiovascular:  EF 25-30%, dilated LV - diffuse hypokinesis, rheumatic-appearing Aortic Vallve s/p repair with moderate AS (AVA 1.1 cm^2), moderate central AI.  MV s/p repeat with mild mitral stenosis and eccentric severe MR.    . TEE WITHOUT CARDIOVERSION    . TEE WITHOUT CARDIOVERSION N/A 06/28/2017   Procedure: TRANSESOPHAGEAL ECHOCARDIOGRAM (TEE);  Surgeon: Larey Dresser, MD;  Location: [facility] ENDOSCOPY;  Service: Cardiovascular;  Laterality: N/A;  . THYROID SURGERY    . TRANSTHORACIC ECHOCARDIOGRAM  05/2016   EF 25-30%. Moderate aortic stenosis with moderate regurgitation. Also potential aortic stenosis with likely worse than expected regurgitation  . TRANSTHORACIC ECHOCARDIOGRAM  May 2012    EF 40-45%, inferior hypokinesis. Slightly increased transaortic velocity = mild AS with mild  to moderate AI normal MV gradients with no MR. Significantly improved PA pressures. Paradoxical septal motion.    There were no vitals filed for this visit.   Subjective Assessment - 11/16/17 1329    Subjective  Pt reports shortness of breath has eased once back on her thyroid medication. She still reports dizziness with getting up from sitting down and with increased activity such as lengthy community distance walking.    Patient Stated Goals  to fix heart and stay active/get rid of dizzness, low energy     Currently in Pain?  No/denies         [facility] PT Assessment - 11/16/17 0001      Assessment   Medical Diagnosis  severe aortic stenosis    Referring Provider  Dr. Darylene Price     Onset Date/Surgical Date  -- approximately 3 months ago      Precautions   Precautions  None;Other (comment)    Precaution Comments  no lifting >10 lbs      Restrictions   Weight Bearing Restrictions  No      Balance Screen   Has the patient fallen in the past 6 months  No    Has the patient had a decrease in activity level because of a fear of falling?   No    Is the patient reluctant to leave their home because of a fear of falling?   No      Home Environment   Living Environment  Private residence    Living Arrangements  Children    Type of [place] Access  Level entry    [place]  One level      Prior Function   Level of Independence  Independent with community mobility without device      Posture/Postural Control   Posture/Postural Control  Postural limitations    Postural Limitations  Rounded Shoulders;Forward head mild to moderate      ROM / Strength   AROM / PROM / Strength  AROM;Strength      AROM   Overall AROM Comments  grossly WNL      Strength   Overall Strength Comments  grossly 5/5 throughout    Strength Assessment Site  Hand    Right/Left hand  Right;Left    Right Hand Grip (lbs)  25 R hand dominant    Left Hand Grip (lbs)  35       Ambulation/Gait   Gait Comments  Pt ambulates with slow speed. Gait distance limited by >50% for age/gender.        [facility] Pre-Surgical Assessment - 11/16/17 0001    5 Meter Walk Test- trial 1  7 sec    5 Meter Walk Test- trial 2  7 sec.     5 Meter Walk Test- trial 3  7 sec.  5 meter walk test average  7 sec    4 Stage Balance Test tolerated for:   2 sec.    4 Stage Balance Test Position  4    Sit To Stand Test- trial 1  14 sec.    ADL/IADL Independent with:  Bathing;Dressing;Meal prep;Finances no yardwork at [employer] with:  [employer]alla [employer]aver work if she had to would need help    ADL/IADL Fraility Index  Vulnerable    6 Minute Walk- Baseline  yes    BP (mmHg)  86/42  (Abnormal)     HR (bpm)  66    02 Sat (%RA)  99 %    Modified Borg Scale for Dyspnea  0- Nothing at all    Perceived Rate of Exertion (Borg)  6-    6 Minute Walk Post Test  yes    BP (mmHg)  101/70    HR (bpm)  81    02 Sat (%RA)  92 %    Modified Borg Scale for Dyspnea  0.5- Very, very slight shortness of breath    Perceived Rate of Exertion (Borg)  11- Fairly light    Aerobic Endurance Distance Walked  840              Objective measurements completed on examination: See above findings.                           Plan - 11/16/17 1421    Clinical Impression Statement  see below    PT Frequency  One time visit      Clinical Impression Statement: Pt is a 57 yo female presenting to OP PT for evaluation prior to possible TAVR surgery due to severe aortic stenosis. Pt reports onset of shortness of breath and dizziness approximately 3 months ago. Shortness of breath has improved some with medication changes but dizziness continues. Pt presents with good ROM and strength, good balance and is not at high fall risk 4 stage balance test, slow walking speed and poor aerobic endurance per 6 minute walk test. Pt ambulated a total of 840 feet in 6 minute walk. BP increased  significantly with 6 minute walk test and was low to begin with. Based on the Short Physical Performance Battery, patient has a frailty rating of 9/12 with </= 5/12 considered frail.   Patient demonstrated the following deficits and impairments:     Visit Diagnosis: Other abnormalities of gait and mobility  Abnormal posture     Problem List Patient Active Problem List   Diagnosis Date Noted  . Aortic valve stenosis and insufficiency, rheumatic 07/27/2016  . Pulmonary hypertension ([place])   . Valvular cardiomyopathy ([place])   . History of GI bleeding   . Angina, class II ([place]) 07/05/2016  . Edema extremities 04/15/2015  . 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 Chronic combined systolic and diastolic CHF (congestive heart failure) ([place])   . Morbid obesity ([place])   . Conjunctivitis of both eyes 12/16/2013  . Essential hypertension 12/16/2013  . Hyperlipidemia with target LDL less than 100 12/16/2013  . Cellulitis of axillary region 12/16/2013  . Hypothyroidism 12/16/2013  . Hx of thyroid cancer 12/16/2013  . Post-menopausal bleeding 12/05/2011  . Fibroids 12/05/2011  . 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 S/P aortic valve repair 12/23/2008  . S/P MVR (mitral valve repair) 12/23/2008    [place], PT 11/16/2017, 2:23 PM  [facility] [address] [place], [place], [address] Phone: [phone]   Fax:  [phone]  Name: Mykah Bellomo MRN: [id number] Date of Birth: [birth year]

## 2017-11-16 NOTE — Progress Notes (Signed)
[facility]       CARDIOTHORACIC SURGERY NOTE  Referring Provider is Larey Dresser, MD  Primary Cardiologist is Leonie Man, MD PCP is Selinda Orion   HPI:  Patient is a 57 year old morbidly obese African-American female with long-standing history of chronic combined systolic and diastolic congestive heart failure related to rheumatic heart disease, status post aortic valve repair and mitral valve repair in 2010, whoreturns to the office todayto discuss treatment options for management of recurrent aorticvalve disease with moderate aortic stenosis and severe aortic insufficiency.    Her clinical condition and numerous diagnostic tests have been reviewed extensively by a multidisciplinary team of specialists, and we have tentatively made plans for transcatheter aortic valve replacement next week.  She was last seen in our office on October 23, 2017 at which time she was feeling poorly and complaining of dizziness, generalized weakness, and poor appetite.  She was notably hypotensive at the time.  She was referred back to the advanced heart failure clinic and her heart failure medications were cut back.  Basic metabolic panel revealed acute renal insufficiency at the time with serum creatinine measured 2.3 up from her baseline 1.1-1.3.  She returns to our office today and reports that she feels much better.  She notes that her exercise tolerance is improved and she was able to walk with physical therapy today.  She states that her appetite and strength have returned to normal.  She only gets short of breath with more strenuous exertion.  She denies any PND, orthopnea, or lower extremity edema.  The remainder of her review of systems is unremarkable and she hopes to proceed with transcatheter aortic valve replacement next week as previously planned.   Current Outpatient Medications  Medication Sig Dispense Refill  . acetaminophen  (TYLENOL) 325 MG tablet Take 650 mg every 6 (six) hours as needed by mouth for mild pain.    [name] albuterol (PROVENTIL HFA;VENTOLIN HFA) 108 (90 BASE) MCG/ACT inhaler Inhale 2 puffs into the lungs every 6 (six) hours as needed for shortness of breath.     [name] aspirin 81 MG chewable tablet Chew 1 tablet (81 mg total) by mouth daily. 30 tablet 3  . beclomethasone (QVAR) 40 MCG/ACT inhaler Inhale 2 puffs into the lungs daily as needed (shortness of breath).     . carvedilol (COREG) 12.5 MG tablet TAKE 1 TABLET BY MOUTH TWICE A DAY WITH MEALS 60 tablet 2  . docusate sodium (COLACE) 100 MG capsule Take 1 capsule (100 mg total) by mouth 2 (two) times daily. 60 capsule 3  . furosemide (LASIX) 40 MG tablet Take 1 tablet (40 mg total) by mouth daily. 30 tablet 11  . levothyroxine (SYNTHROID, LEVOTHROID) 125 MCG tablet Take 125 mcg by mouth daily before breakfast.   0  . potassium chloride (K-DUR) 10 MEQ tablet Take 2 tablets (20 mEq total) by mouth daily. 60 tablet 3  . rosuvastatin (CRESTOR) 20 MG tablet Take 1 tablet (20 mg total) by mouth daily. 90 tablet 3  . sacubitril-valsartan (ENTRESTO) 24-26 MG Take 1 tablet by mouth 2 (two) times daily. 60 tablet 3  . spironolactone (ALDACTONE) 25 MG tablet Take 1 tablet (25 mg total) by mouth daily. 30 tablet 3   No current facility-administered medications for this visit.       Physical Exam:   BP 102/70   Pulse (!) 55   Resp 18   Ht 4\' 9"  (  1.448 m)   Wt 195 lb (88.5 kg)   LMP 03/06/2012   SpO2 99% Comment: RA  BMI 42.20 kg/m   General:  Well-appearing  Chest:   Clear to auscultation  CV:   Regular rate and rhythm with soft systolic murmur  Incisions:  n/a  Abdomen:  Soft nontender  Extremities:  Warm and well-perfused  Diagnostic Tests:  STS Risk Calculator  Procedure: Isolated AVR CALCULATE   Risk of Mortality:  5.991% Renal Failure:  15.129% Permanent Stroke:  0.690% Prolonged Ventilation:  27.614% DSW Infection:   0.304% Reoperation:  2.585% Morbidity or Mortality:  36.820% Short Length of Stay:  18.137% Long Length of Stay:  12.368%    Impression:  Patient has long-standing rheumatic valvular heart disease with chronic combined systolic and diastolic congestive heart failure status post aortic valve repair and mitral valve repair in 2010.She developedmoderate aortic stenosis withsevere aortic insufficiencyfor which shepresented more than 1 year ago with anacute exacerbation ofCHF.At that time she had severely depressed left ventricular systolic function and severe mitral regurgitation. Remarkably, shehas been doing much better for the past year on medical therapy with close follow-up through the advanced heart failure clinic. Follow-up echocardiograms have documented continued gradual improvement in left ventricular systolic function and resolution of mitral regurgitation. Recently the patient has remained quite stable on her current medical regimen, with symptoms of exertional shortness of breath occurring only with more strenuous physical activity consistent with chronic combined systolic and diastolic congestive heart failure, New York Heart Association functional class II.  The patient'srecent follow-uptransthoracic andtransesophageal echocardiograms have been reviewed with a multidisciplinary team of specialists.  TEE demonstrates some further improvement in global left ventricular systolic function but mild to moderate aortic stenosis withsevere aortic insufficiency.Peak velocity across aortic valve measured greater than or equal to 3 m/s, although flows across the valve are probably elevated due to the underlying severe aortic insufficiency. Her mitral valve repair appears intact and there is only trivial residual mitral regurgitation.Mean transvalvular gradient across the mitral valve estimated only 5 mmHg. We do not feel that redo mitral valve repair or replacement is  indicated.   Options include continued medical therapy with close follow-up versus consideration of redo aortic valve replacement. The patient is remarkably stable at this time, but her aortic insufficiency is quite severe and there is no question that her long-term prognosis would remain guarded at best without surgical intervention. Risks associated with conventional surgical aortic valve replacement would unquestionably be relatively high because of the patient's previous surgery, her numerous comorbid medical problems including significant left ventricular systolic dysfunction, and the presence of relatively small size aortic root which might necessitate need for aortic root enlargement or replacement in order to avoid the potential for creating patient prosthesis mismatch.    I think that risks associated with conventional surgery would be much higher than that predicted using the STS risk calculator.  Cardiac-gated CTA of the heart reveals anatomical characteristics consistent with aortic stenosispossiblysuitable for treatment by transcatheter aortic valve replacementalthough there is essentially no annular calcification and minimal leaflet calcification in the aortic valve, the dimensions of the aortic root and coronary sinuses are relatively small, and the coronary arteries are less than 10 mm from the aortic annulus.CTA of the aorta and iliac vessels demonstrate what appears to be adequate pelvic vascular access to facilitate a transfemoral approach.   Plan:  I have again discussed options with the patient in the office today. We discussed alternatives including continued medical therapy, redo sternotomy  for aortic valve replacement which would likely require aortic root enlargement or root replacement, and transcatheter aortic valve replacement. We discussed concerns regarding her long-term prognosis without surgical intervention on continued medical therapy. We discussed the very high  risks associated with conventional surgical aortic valve replacement. Finally, we discussed issues specific to transcatheter aortic valve replacement including questions about long term valve durability, the potential for paravalvular leak, possible increased risk of need for permanent pacemaker placement, and other technical complications related to the procedure itselfincluding acute coronary occlusion and/or valve embolization.    We tentatively plan to proceed with transcatheter aortic valve replacement next week if the patient's renal function has returned to baseline.  Following the decision to proceed with transcatheter aortic valve replacement, a discussion has been held regarding what types of management strategies would be attempted intraoperatively in the event of life-threatening complications, including whether or not the patient would be considered a candidate for the use of cardiopulmonary bypass and/or conversion to open sternotomy for attempted surgical intervention.  The patient has been advised of a variety of complications that might develop including but not limited to risks of death, stroke, paravalvular leak, aortic dissection or other major vascular complications, aortic annulus rupture, device embolization, cardiac rupture or perforation, mitral regurgitation, acute myocardial infarction, arrhythmia, heart block or bradycardia requiring permanent pacemaker placement, congestive heart failure, respiratory failure, renal failure, pneumonia, infection, other late complications related to structural valve deterioration or migration, or other complications that might ultimately cause a temporary or permanent loss of functional independence or other long term morbidity.  The patient provides full informed consent for the procedure as described and all questions were answered.   I spent in excess of 15 minutes during the conduct of this office consultation and >50% of this time involved  direct face-to-face encounter with the patient for counseling and/or coordination of their care.     Valentina Gu. Roxy Manns, MD 11/16/2017 3:04 PM

## 2017-11-16 NOTE — H&P (View-Only) (Signed)
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    Her clinical condition and numerous diagnostic tests have been reviewed extensively by a multidisciplinary team of specialists, and we have tentatively made plans for transcatheter aortic valve replacement next week.  She was last seen in our office on October 23, 2017 at which time she was feeling poorly and complaining of dizziness, generalized weakness, and poor appetite.  She was notably hypotensive at the time.  She was referred back to the advanced heart failure clinic and her heart failure medications were cut back.  Basic metabolic panel revealed acute renal insufficiency at the time with serum creatinine measured 2.3 up from her baseline 1.1-1.3.  She returns to our office today and reports that she feels much better.  She notes that her exercise tolerance is improved and she was able to walk with physical therapy today.  She states that her appetite and strength have returned to normal.  She only gets short of breath with more strenuous exertion.  She denies any PND, orthopnea, or lower extremity edema.  The remainder of her review of systems is unremarkable and she hopes to proceed with transcatheter aortic valve replacement next week as previously planned.   Current Outpatient Medications  Medication Sig Dispense Refill  . acetaminophen  (TYLENOL) 325 MG tablet Take 650 mg every 6 (six) hours as needed by mouth for mild pain.    [name] albuterol (PROVENTIL HFA;VENTOLIN HFA) 108 (90 BASE) MCG/ACT inhaler Inhale 2 puffs into the lungs every 6 (six) hours as needed for shortness of breath.     [name] aspirin 81 MG chewable tablet Chew 1 tablet (81 mg total) by mouth daily. 30 tablet 3  . beclomethasone (QVAR) 40 MCG/ACT inhaler Inhale 2 puffs into the lungs daily as needed (shortness of breath).     . carvedilol (COREG) 12.5 MG tablet TAKE 1 TABLET BY MOUTH TWICE A DAY WITH MEALS 60 tablet 2  . docusate sodium (COLACE) 100 MG capsule Take 1 capsule (100 mg total) by mouth 2 (two) times daily. 60 capsule 3  . furosemide (LASIX) 40 MG tablet Take 1 tablet (40 mg total) by mouth daily. 30 tablet 11  . levothyroxine (SYNTHROID, LEVOTHROID) 125 MCG tablet Take 125 mcg by mouth daily before breakfast.   0  . potassium chloride (K-DUR) 10 MEQ tablet Take 2 tablets (20 mEq total) by mouth daily. 60 tablet 3  . rosuvastatin (CRESTOR) 20 MG tablet Take 1 tablet (20 mg total) by mouth daily. 90 tablet 3  . sacubitril-valsartan (ENTRESTO) 24-26 MG Take 1 tablet by mouth 2 (two) times daily. 60 tablet 3  . spironolactone (ALDACTONE) 25 MG tablet Take 1 tablet (25 mg total) by mouth daily. 30 tablet 3   No current facility-administered medications for this visit.       Physical Exam:   BP 102/70   Pulse (!) 55   Resp 18   Ht 4\' 9"  (  1.448 m)   Wt 195 lb (88.5 kg)   LMP 03/06/2012   SpO2 99% Comment: RA  BMI 42.20 kg/m   General:  Well-appearing  Chest:   Clear to auscultation  CV:   Regular rate and rhythm with soft systolic murmur  Incisions:  n/a  Abdomen:  Soft nontender  Extremities:  Warm and well-perfused  Diagnostic Tests:  STS Risk Calculator  Procedure: Isolated AVR CALCULATE   Risk of Mortality:  5.991% Renal Failure:  15.129% Permanent Stroke:  0.690% Prolonged Ventilation:  27.614% DSW Infection:   0.304% Reoperation:  2.585% Morbidity or Mortality:  36.820% Short Length of Stay:  18.137% Long Length of Stay:  12.368%    Impression:  Patient has long-standing rheumatic valvular heart disease with chronic combined systolic and diastolic congestive heart failure status post aortic valve repair and mitral valve repair in 2010.She developedmoderate aortic stenosis withsevere aortic insufficiencyfor which shepresented more than 1 year ago with anacute exacerbation ofCHF.At that time she had severely depressed left ventricular systolic function and severe mitral regurgitation. Remarkably, shehas been doing much better for the past year on medical therapy with close follow-up through the advanced heart failure clinic. Follow-up echocardiograms have documented continued gradual improvement in left ventricular systolic function and resolution of mitral regurgitation. Recently the patient has remained quite stable on her current medical regimen, with symptoms of exertional shortness of breath occurring only with more strenuous physical activity consistent with chronic combined systolic and diastolic congestive heart failure, New York Heart Association functional class II.  The patient'srecent follow-uptransthoracic andtransesophageal echocardiograms have been reviewed with a multidisciplinary team of specialists.  TEE demonstrates some further improvement in global left ventricular systolic function but mild to moderate aortic stenosis withsevere aortic insufficiency.Peak velocity across aortic valve measured greater than or equal to 3 m/s, although flows across the valve are probably elevated due to the underlying severe aortic insufficiency. Her mitral valve repair appears intact and there is only trivial residual mitral regurgitation.Mean transvalvular gradient across the mitral valve estimated only 5 mmHg. We do not feel that redo mitral valve repair or replacement is  indicated.   Options include continued medical therapy with close follow-up versus consideration of redo aortic valve replacement. The patient is remarkably stable at this time, but her aortic insufficiency is quite severe and there is no question that her long-term prognosis would remain guarded at best without surgical intervention. Risks associated with conventional surgical aortic valve replacement would unquestionably be relatively high because of the patient's previous surgery, her numerous comorbid medical problems including significant left ventricular systolic dysfunction, and the presence of relatively small size aortic root which might necessitate need for aortic root enlargement or replacement in order to avoid the potential for creating patient prosthesis mismatch.    I think that risks associated with conventional surgery would be much higher than that predicted using the STS risk calculator.  Cardiac-gated CTA of the heart reveals anatomical characteristics consistent with aortic stenosispossiblysuitable for treatment by transcatheter aortic valve replacementalthough there is essentially no annular calcification and minimal leaflet calcification in the aortic valve, the dimensions of the aortic root and coronary sinuses are relatively small, and the coronary arteries are less than 10 mm from the aortic annulus.CTA of the aorta and iliac vessels demonstrate what appears to be adequate pelvic vascular access to facilitate a transfemoral approach.   Plan:  I have again discussed options with the patient in the office today. We discussed alternatives including continued medical therapy, redo sternotomy  for aortic valve replacement which would likely require aortic root enlargement or root replacement, and transcatheter aortic valve replacement. We discussed concerns regarding her long-term prognosis without surgical intervention on continued medical therapy. We discussed the very high  risks associated with conventional surgical aortic valve replacement. Finally, we discussed issues specific to transcatheter aortic valve replacement including questions about long term valve durability, the potential for paravalvular leak, possible increased risk of need for permanent pacemaker placement, and other technical complications related to the procedure itselfincluding acute coronary occlusion and/or valve embolization.    We tentatively plan to proceed with transcatheter aortic valve replacement next week if the patient's renal function has returned to baseline.  Following the decision to proceed with transcatheter aortic valve replacement, a discussion has been held regarding what types of management strategies would be attempted intraoperatively in the event of life-threatening complications, including whether or not the patient would be considered a candidate for the use of cardiopulmonary bypass and/or conversion to open sternotomy for attempted surgical intervention.  The patient has been advised of a variety of complications that might develop including but not limited to risks of death, stroke, paravalvular leak, aortic dissection or other major vascular complications, aortic annulus rupture, device embolization, cardiac rupture or perforation, mitral regurgitation, acute myocardial infarction, arrhythmia, heart block or bradycardia requiring permanent pacemaker placement, congestive heart failure, respiratory failure, renal failure, pneumonia, infection, other late complications related to structural valve deterioration or migration, or other complications that might ultimately cause a temporary or permanent loss of functional independence or other long term morbidity.  The patient provides full informed consent for the procedure as described and all questions were answered.   I spent in excess of 15 minutes during the conduct of this office consultation and >50% of this time involved  direct face-to-face encounter with the patient for counseling and/or coordination of their care.     Valentina Gu. Roxy Manns, MD 11/16/2017 3:04 PM

## 2017-11-16 NOTE — Patient Instructions (Signed)
   Continue taking all current medications without change through the day before surgery.  Have nothing to eat or drink after midnight the night before surgery.  On the morning of surgery take only Synthroid (thyroid medicine) with a sip of water.

## 2017-11-17 ENCOUNTER — Encounter (HOSPITAL_COMMUNITY): Payer: Self-pay

## 2017-11-17 ENCOUNTER — Other Ambulatory Visit: Payer: Self-pay

## 2017-11-17 ENCOUNTER — Other Ambulatory Visit (HOSPITAL_COMMUNITY): Payer: Medicaid Other

## 2017-11-17 ENCOUNTER — Ambulatory Visit (HOSPITAL_COMMUNITY)
Admission: RE | Admit: 2017-11-17 | Discharge: 2017-11-17 | Disposition: A | Discharge status: Home / Self Care | Payer: Medicaid Other | Source: Ambulatory Visit | Attending: Cardiovascular Disease | Admitting: Cardiovascular Disease

## 2017-11-17 ENCOUNTER — Encounter (HOSPITAL_COMMUNITY)
Admission: RE | Admit: 2017-11-17 | Discharge: 2017-11-17 | Disposition: A | Discharge status: Home / Self Care | Payer: Medicaid Other | Source: Ambulatory Visit | Attending: Cardiovascular Disease | Admitting: Cardiovascular Disease

## 2017-11-17 ENCOUNTER — Ambulatory Visit (HOSPITAL_BASED_OUTPATIENT_CLINIC_OR_DEPARTMENT_OTHER)
Admission: RE | Admit: 2017-11-17 | Discharge: 2017-11-17 | Disposition: A | Discharge status: Home / Self Care | Payer: Medicaid Other | Source: Ambulatory Visit | Attending: Thoracic Surgery (Cardiothoracic Vascular Surgery) | Admitting: Thoracic Surgery (Cardiothoracic Vascular Surgery)

## 2017-11-17 DIAGNOSIS — I350 Nonrheumatic aortic (valve) stenosis: Secondary | ICD-10-CM | POA: Diagnosis not present

## 2017-11-17 DIAGNOSIS — I351 Nonrheumatic aortic (valve) insufficiency: Secondary | ICD-10-CM

## 2017-11-17 DIAGNOSIS — R42 Dizziness and giddiness: Secondary | ICD-10-CM | POA: Diagnosis not present

## 2017-11-17 DIAGNOSIS — I440 Atrioventricular block, first degree: Secondary | ICD-10-CM | POA: Diagnosis not present

## 2017-11-17 DIAGNOSIS — I252 Old myocardial infarction: Secondary | ICD-10-CM | POA: Diagnosis not present

## 2017-11-17 LAB — BASIC METABOLIC PANEL
ANION GAP: 7 (ref 5–15)
BUN: 23 mg/dL — ABNORMAL HIGH (ref 6–20)
CALCIUM: 8.5 mg/dL — AB (ref 8.9–10.3)
CHLORIDE: 107 mmol/L (ref 98–111)
CO2: 25 mmol/L (ref 22–32)
Creatinine, Ser: 1.59 mg/dL — ABNORMAL HIGH (ref 0.44–1.00)
GFR calc non Af Amer: 35 mL/min — ABNORMAL LOW (ref >60)
GFR, EST AFRICAN AMERICAN: 41 mL/min — AB (ref >60)
Glucose, Bld: 89 mg/dL (ref 70–99)
Potassium: 4.6 mmol/L (ref 3.5–5.1)
Sodium: 139 mmol/L (ref 135–145)

## 2017-11-17 LAB — SURGICAL PCR SCREEN
MRSA, PCR: NEGATIVE
STAPHYLOCOCCUS AUREUS: NEGATIVE

## 2017-11-17 LAB — BLOOD GAS, ARTERIAL
Acid-base deficit: 1.4 mmol/L (ref 0.0–2.0)
Bicarbonate: 22.2 mmol/L (ref 20.0–28.0)
DRAWN BY: 470591
FIO2: 21
O2 SAT: 98.2 %
PATIENT TEMPERATURE: 98.6
pCO2 arterial: 32.9 mmHg (ref 32.0–48.0)
pH, Arterial: 7.443 (ref 7.350–7.450)
pO2, Arterial: 114 mmHg — ABNORMAL HIGH (ref 83.0–108.0)

## 2017-11-17 LAB — CBC
HCT: 37.3 % (ref 36.0–46.0)
Hemoglobin: 11.6 g/dL — ABNORMAL LOW (ref 12.0–15.0)
MCH: 28.5 pg (ref 26.0–34.0)
MCHC: 31.1 g/dL (ref 30.0–36.0)
MCV: 91.6 fL (ref 78.0–100.0)
PLATELETS: 153 10*3/uL (ref 150–400)
RBC: 4.07 MIL/uL (ref 3.87–5.11)
RDW: 17.2 % — ABNORMAL HIGH (ref 11.5–15.5)
WBC: 6.7 10*3/uL (ref 4.0–10.5)

## 2017-11-17 LAB — URINALYSIS, ROUTINE W REFLEX MICROSCOPIC
Bacteria, UA: NONE SEEN
Bilirubin Urine: NEGATIVE
Glucose, UA: NEGATIVE mg/dL
Ketones, ur: NEGATIVE mg/dL
Leukocytes, UA: NEGATIVE
Nitrite: NEGATIVE
PH: 6 (ref 5.0–8.0)
Protein, ur: 30 mg/dL — AB
Specific Gravity, Urine: 1.016 (ref 1.005–1.030)

## 2017-11-17 LAB — COMPREHENSIVE METABOLIC PANEL
ALBUMIN: 4 g/dL (ref 3.5–5.0)
ALT: 20 U/L (ref 0–44)
AST: 23 U/L (ref 15–41)
Alkaline Phosphatase: 57 U/L (ref 38–126)
Anion gap: 8 (ref 5–15)
BUN: 24 mg/dL — AB (ref 6–20)
CHLORIDE: 109 mmol/L (ref 98–111)
CO2: 21 mmol/L — AB (ref 22–32)
Calcium: 8.5 mg/dL — ABNORMAL LOW (ref 8.9–10.3)
Creatinine, Ser: 1.41 mg/dL — ABNORMAL HIGH (ref 0.44–1.00)
GFR calc Af Amer: 47 mL/min — ABNORMAL LOW (ref >60)
GFR calc non Af Amer: 41 mL/min — ABNORMAL LOW (ref >60)
GLUCOSE: 104 mg/dL — AB (ref 70–99)
POTASSIUM: 4.7 mmol/L (ref 3.5–5.1)
SODIUM: 138 mmol/L (ref 135–145)
Total Bilirubin: 0.9 mg/dL (ref 0.3–1.2)
Total Protein: 6.9 g/dL (ref 6.5–8.1)

## 2017-11-17 LAB — TYPE AND SCREEN
ABO/RH(D): O NEG
Antibody Screen: NEGATIVE

## 2017-11-17 LAB — APTT: aPTT: 33 seconds (ref 24–36)

## 2017-11-17 LAB — PROTIME-INR
INR: 1.09
Prothrombin Time: 14 seconds (ref 11.4–15.2)

## 2017-11-17 LAB — BRAIN NATRIURETIC PEPTIDE: B NATRIURETIC PEPTIDE 5: 404.8 pg/mL — AB (ref 0.0–100.0)

## 2017-11-17 LAB — HEMOGLOBIN A1C
HEMOGLOBIN A1C: 6.8 % — AB (ref 4.8–5.6)
Mean Plasma Glucose: 148.46 mg/dL

## 2017-11-17 NOTE — Progress Notes (Signed)
Bilateral carotid duplex completed. No evidence of significant ICA stenosis bilaterally. Vertebral artery flow is antegrade bilaterally. [facility], [place] 11/17/2017. 2:09 PM

## 2017-11-17 NOTE — Progress Notes (Signed)
PCP - Roe Coombs, PA-C Cardiologist - Dr. Glenetta Hew  Chest x-ray - 11/17/2017 EKG - 11/17/2017 Stress Test - 07/2016 ECHO - 06/2017 Cardiac Cath -08/2016   Sleep Study - denies  Aspirin Instructions: Per patient, pt instructed by Dr. Roxy Manns to keep taking medications as prescribed, only taking levothyroxine the morning of surgery.   Anesthesia review: Yes  Patient denies shortness of breath, fever, cough and chest pain at PAT appointment  Pt experiencing post menopausal bleeding at this time. Last reported menstrual cycle was in February and lasted about a month.   Patient verbalized understanding of instructions that were given to them at the PAT appointment. Patient was also instructed that they will need to review over the PAT instructions again at home before surgery.

## 2017-11-20 MED ORDER — POTASSIUM CHLORIDE 2 MEQ/ML IV SOLN
80.0000 meq | INTRAVENOUS | Status: DC
  Filled 2017-11-20: qty 40

## 2017-11-20 MED ORDER — SODIUM CHLORIDE 0.9 % IV SOLN
30.0000 ug/min | INTRAVENOUS | Status: DC
  Filled 2017-11-20: qty 2

## 2017-11-20 MED ORDER — VANCOMYCIN HCL 10 G IV SOLR
1500.0000 mg | INTRAVENOUS | Status: AC
  Administered 2017-11-21: 1500 mg via INTRAVENOUS
  Filled 2017-11-20: qty 1500

## 2017-11-20 MED ORDER — NITROGLYCERIN IN D5W 200-5 MCG/ML-% IV SOLN
2.0000 ug/min | INTRAVENOUS | Status: DC
  Filled 2017-11-20: qty 250

## 2017-11-20 MED ORDER — MAGNESIUM SULFATE 50 % IJ SOLN
40.0000 meq | INTRAMUSCULAR | Status: DC
  Filled 2017-11-20: qty 9.85

## 2017-11-20 MED ORDER — NOREPINEPHRINE 4 MG/250ML-% IV SOLN
0.0000 ug/min | INTRAVENOUS | Status: AC
  Administered 2017-11-21: 3 ug/min via INTRAVENOUS
  Filled 2017-11-20: qty 250

## 2017-11-20 MED ORDER — DOPAMINE-DEXTROSE 3.2-5 MG/ML-% IV SOLN
0.0000 ug/kg/min | INTRAVENOUS | Status: DC
  Filled 2017-11-20: qty 250

## 2017-11-20 MED ORDER — DEXMEDETOMIDINE HCL IN NACL 400 MCG/100ML IV SOLN
0.1000 ug/kg/h | INTRAVENOUS | Status: AC
  Administered 2017-11-21: 8 ug/kg/h via INTRAVENOUS
  Filled 2017-11-20: qty 100

## 2017-11-20 MED ORDER — SODIUM CHLORIDE 0.9 % IV SOLN
1.5000 g | INTRAVENOUS | Status: AC
  Administered 2017-11-21: 1.5 g via INTRAVENOUS
  Filled 2017-11-20: qty 1.5

## 2017-11-20 MED ORDER — SODIUM CHLORIDE 0.9 % IV SOLN
INTRAVENOUS | Status: DC
  Filled 2017-11-20: qty 30

## 2017-11-20 MED ORDER — EPINEPHRINE PF 1 MG/ML IJ SOLN
0.0000 ug/min | INTRAVENOUS | Status: DC
  Filled 2017-11-20: qty 4

## 2017-11-20 MED ORDER — SODIUM CHLORIDE 0.9 % IV SOLN
INTRAVENOUS | Status: DC
  Filled 2017-11-20: qty 1

## 2017-11-20 NOTE — Progress Notes (Signed)
Anesthesia Chart Review:  Case:  [id number] Date/Time:  11/21/17 1002   Procedures:      TRANSCATHETER AORTIC VALVE REPLACEMENT, TRANSFEMORAL (N/A Chest)     TRANSESOPHAGEAL ECHOCARDIOGRAM (TEE) (N/A )   Anesthesia type:  General   Pre-op diagnosis:  Severe Aortic Insufficiency   Location:  [facility] OR ROOM 16 / [facility]   Surgeon:  Sherren Mocha, MD      DISCUSSION: Pt is a 57 yo female scheduled for the above procedure. Hx of HTN, hypothyroid, HLD, CHF, CKD stage II-III. 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   VS: BP (!) 99/58   Pulse 65   Temp 36.9 C   Resp 18   Ht 4\' 9"  (1.448 m)   Wt 196 lb 1.6 oz (89 kg)   LMP 07/07/2016 (Within Days)   SpO2 99%   BMI 42.44 kg/m   PROVIDERS: Aura Dials, PA-C is PCP last seen 06/01/2017 Glenetta Hew, MD is Cardiologist last seen 11/16/2017 Larey Dresser, MD is HF specialist last seen 11/10/2017   LABS: Labs reviewed: Acceptable for surgery. Labs reviewed and stable. (all labs ordered are listed, but only abnormal results are displayed)  Labs Reviewed  BLOOD GAS, ARTERIAL - Abnormal; Notable for the following components:      Result Value   pO2, Arterial 114 (*)    All other components within normal limits  BRAIN NATRIURETIC PEPTIDE - Abnormal; Notable for the following components:   B Natriuretic Peptide 404.8 (*)    All other components within normal limits  CBC - Abnormal; Notable for the following components:   Hemoglobin 11.6 (*)    RDW 17.2 (*)    All other components within normal limits  COMPREHENSIVE METABOLIC PANEL - Abnormal; Notable for the following components:   CO2 21 (*)    Glucose, Bld 104 (*)    BUN 24 (*)    Creatinine, Ser 1.41 (*)    Calcium 8.5 (*)    GFR calc non Af Amer 41 (*)    GFR calc Af Amer 47 (*)    All other components within normal  limits  HEMOGLOBIN A1C - Abnormal; Notable for the following components:   Hgb A1c MFr Bld 6.8 (*)    All other components within normal limits  URINALYSIS, ROUTINE W REFLEX MICROSCOPIC - Abnormal; Notable for the following components:   Hgb urine dipstick MODERATE (*)    Protein, ur 30 (*)    All other components within normal limits  SURGICAL PCR SCREEN  APTT  PROTIME-INR  TYPE AND SCREEN    IMAGES: 11/17/2017 EXAM: CHEST - 2 VIEW  COMPARISON:  Chest x-ray of July 05, 2016  FINDINGS: The lungs are adequately inflated and clear. The cardiac silhouette is enlarged. Prosthetic valve rings are present. The pulmonary vascularity is normal. There is no pleural effusion. The sternal wires are intact. The bony thorax exhibits no acute abnormality.  IMPRESSION: Stable cardiomegaly. No pulmonary edema nor other acute cardiopulmonary abnormality.    EKG: EKG November 17, 2017 shows sinus rhythm with first-degree AV block with PACs with apparent conduction.  Inferior infarct, age undetermined.  Anterior infarct, age undetermined.  T wave abnormality, consider lateral ischemia. No significant change since last tracing per [employer].   CV: - Echo (11/15): EF 40-45%, - Echo (12/16): EF 35-40%. - Echo (1/18): EF 25-30%.  -  LHC/RHC (2/18): Normal coronaries, mean RA 18, mean PA 74/39 mean 52, mean PCWP 31, CI 1.98 Fick. - CPX (3/18): peak VO2 11.7, VE/VCO2 slope 40, RER 1.11 => moderate to severely decreased functional capacity due to HF.  - RHC (4/18): mean RA 4, PA 44/17 mean 28, mean PCWP 15, CI 1.73, PVR 4 WU.  - Echo (8/18): EF 30-35%, severe AI with moderate AS but only mild MR noted with mean MV gradient 4 mmHg.  The RV was mildly dilated with mildly decreased systolic function. - TEE 1/66: EF 45%, severe aortic insufficiency, mild to moderate AS, mild mitral valve stenosis s/p MV repair - Coronary CTA (5/19): No obstructive coronary disease.  - Bilateral Carotid US  (6/19): Right Carotid: There is no evidence of stenosis in the right ICA. Left Carotid: There was no evidence of thrombus, dissection, atherosclerotic plaque or stenosis in the cervical carotid system. Vertebrals: Bilateral vertebral arteries demonstrate antegrade flow. Subclavians: Normal flow hemodynamics were seen in bilateral subclavian arteries.    Past Medical History:  Diagnosis Date  . Chronic combined systolic and diastolic CHF (congestive heart failure) (HCC)    EF now down to 25-30%. LVEDP on cath was 30 mmHg with wedge pressure of 31 mmHg.  [name] Dementia    Early onset  . Dysthymia   . Essential hypertension 12/16/2013  . History of GI bleeding   . Hx of thyroid cancer 12/16/2013   Status post thyroidectomy  . Hyperlipidemia with target LDL less than 100 12/16/2013  . Hypothyroidism 12/16/2013  . 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 Morbid obesity ([place])   . Post-menopausal bleeding 12/05/2011  . Pulmonary hypertension ([place]) 05/2016   By cardiac catheterization: mean RA 18, RV pressure/EDP: 70/12/18 mmHg.  mean PA 74/39 mean 52, mean PCWP 31 (with V wave of 45 mmHg), LVEDP ~ 30 mmHg.  [name] RAD (reactive airway disease)   . 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 Roxy Manns  Echo 12/2010: EF 40-45%, inferior hypokinesis; Slow progression of valvular Dz & drop in EF --> 05/2016: EF 25-30% (down from 40-45% post-op) with Mod AS/AI, mild MS & Severe MR (Cath & TEE 06/2016)   . S/P aortic valve repair 11/20/1599   suture plication of 3 commissures  . S/P MVR (mitral valve repair) 12/23/2008   26 mm Sorin MEMO 3D Ring Annuloplasty  . 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 Progressive worsening of aortic and mitral valve disease.    Past Surgical History:  Procedure Laterality Date  . AORTIC VALVE REPAIR  August 0932   Suture  plication of all 3 commissures  . CARDIAC CATHETERIZATION  August 2010   Preop: nonobstructive coronary disease  . CARDIAC SURGERY  12/2008   Aortic & Mitral Valve Repair (Dr. Roxy Manns)  . CESAREAN SECTION    . MITRAL VALVE REPAIR  August 2010    26 mm Sorin [facility] 3D Ring Annuloplasty  . RIGHT HEART CATH N/A 08/30/2016   Procedure: Right Heart Cath;  Surgeon: Larey Dresser, MD;  Location: [place] [facility];  Service: Cardiovascular;  Laterality: N/A;  . RIGHT/LEFT HEART CATH AND CORONARY ANGIOGRAPHY N/A 07/08/2016   Procedure: Right/Left Heart Cath and Coronary Angiography;  Surgeon: Leonie Man, MD;  Location: [place] [facility];  Service: Cardiovascular: Normal coronaries, mean RA 18, RV pressure/EDP: 70/12/18  mmHg.  mean PA 74/39 mean 52, mean PCWP 31 (with V wave of 45 mmHg), LVEDP ~ 30 mmHg.  CI 1.98 Fick. Peak AoV gradient ~15 mHg    . TEE WITHOUT CARDIOVERSION N/A 07/13/2016   Procedure: Transesophageal Echocardiogram (TEE);  Surgeon: [name], MD;  Location: [facility] ENDOSCOPY;  Service: Cardiovascular:  EF 25-30%, dilated LV - diffuse hypokinesis, rheumatic-appearing Aortic Vallve s/p repair with moderate AS (AVA 1.1 cm^2), moderate central AI.  MV s/p repeat with mild mitral stenosis and eccentric severe MR.    . TEE WITHOUT CARDIOVERSION    . TEE WITHOUT CARDIOVERSION N/A 06/28/2017   Procedure: TRANSESOPHAGEAL ECHOCARDIOGRAM (TEE);  Surgeon: Larey Dresser, MD;  Location: [facility] ENDOSCOPY;  Service: Cardiovascular;  Laterality: N/A;  . THYROID SURGERY    . TRANSTHORACIC ECHOCARDIOGRAM  05/2016   EF 25-30%. Moderate aortic stenosis with moderate regurgitation. Also potential aortic stenosis with likely worse than expected regurgitation  . TRANSTHORACIC ECHOCARDIOGRAM  May 2012    EF 40-45%, inferior hypokinesis. Slightly increased transaortic velocity = mild AS with mild to moderate AI normal MV gradients with no MR. Significantly improved PA pressures. Paradoxical septal motion.     MEDICATIONS: . acetaminophen (TYLENOL) 325 MG tablet  . albuterol (PROVENTIL HFA;VENTOLIN HFA) 108 (90 BASE) MCG/ACT inhaler  . aspirin 81 MG chewable tablet  . beclomethasone (QVAR) 40 MCG/ACT inhaler  . carvedilol (COREG) 12.5 MG tablet  . docusate sodium (COLACE) 100 MG capsule  . furosemide (LASIX) 40 MG tablet  . levothyroxine (SYNTHROID, LEVOTHROID) 125 MCG tablet  . potassium chloride (K-DUR) 10 MEQ tablet  . rosuvastatin (CRESTOR) 20 MG tablet  . sacubitril-valsartan (ENTRESTO) 24-26 MG  . spironolactone (ALDACTONE) 25 MG tablet   No current facility-administered medications for this encounter.     Wynonia Musty [facility] Short Stay Center/Anesthesiology Phone [phone] 11/20/2017 10:05 AM

## 2017-11-21 ENCOUNTER — Inpatient Hospital Stay (HOSPITAL_COMMUNITY): Payer: Medicaid Other

## 2017-11-21 ENCOUNTER — Inpatient Hospital Stay (HOSPITAL_COMMUNITY): Payer: Medicaid Other | Admitting: Certified Registered"

## 2017-11-21 ENCOUNTER — Inpatient Hospital Stay (HOSPITAL_COMMUNITY): Payer: Medicaid Other | Admitting: Physician Assistant

## 2017-11-21 ENCOUNTER — Inpatient Hospital Stay (HOSPITAL_COMMUNITY)
Admission: RE | Admit: 2017-11-21 | Discharge: 2017-11-23 | DRG: 266 | Disposition: A | Discharge status: Home / Self Care | Payer: Medicaid Other | Source: Ambulatory Visit | Attending: Cardiovascular Disease | Admitting: Cardiovascular Disease

## 2017-11-21 ENCOUNTER — Other Ambulatory Visit: Payer: Self-pay

## 2017-11-21 ENCOUNTER — Encounter (HOSPITAL_COMMUNITY)
Admission: RE | Disposition: A | Discharge status: Home / Self Care | Payer: Self-pay | Source: Ambulatory Visit | Attending: Cardiovascular Disease

## 2017-11-21 ENCOUNTER — Encounter (HOSPITAL_COMMUNITY): Payer: Self-pay | Admitting: Surgery

## 2017-11-21 DIAGNOSIS — Z006 Encounter for examination for normal comparison and control in clinical research program: Secondary | ICD-10-CM

## 2017-11-21 DIAGNOSIS — Z7982 Long term (current) use of aspirin: Secondary | ICD-10-CM

## 2017-11-21 DIAGNOSIS — I5043 Acute on chronic combined systolic (congestive) and diastolic (congestive) heart failure: Secondary | ICD-10-CM | POA: Diagnosis present

## 2017-11-21 DIAGNOSIS — I440 Atrioventricular block, first degree: Secondary | ICD-10-CM | POA: Diagnosis present

## 2017-11-21 DIAGNOSIS — Z79899 Other long term (current) drug therapy: Secondary | ICD-10-CM

## 2017-11-21 DIAGNOSIS — I428 Other cardiomyopathies: Secondary | ICD-10-CM

## 2017-11-21 DIAGNOSIS — I340 Nonrheumatic mitral (valve) insufficiency: Secondary | ICD-10-CM | POA: Diagnosis not present

## 2017-11-21 DIAGNOSIS — K5900 Constipation, unspecified: Secondary | ICD-10-CM | POA: Diagnosis not present

## 2017-11-21 DIAGNOSIS — Z952 Presence of prosthetic heart valve: Secondary | ICD-10-CM

## 2017-11-21 DIAGNOSIS — I350 Nonrheumatic aortic (valve) stenosis: Secondary | ICD-10-CM

## 2017-11-21 DIAGNOSIS — Z7951 Long term (current) use of inhaled steroids: Secondary | ICD-10-CM

## 2017-11-21 DIAGNOSIS — I130 Hypertensive heart and chronic kidney disease with heart failure and stage 1 through stage 4 chronic kidney disease, or unspecified chronic kidney disease: Secondary | ICD-10-CM | POA: Diagnosis present

## 2017-11-21 DIAGNOSIS — E785 Hyperlipidemia, unspecified: Secondary | ICD-10-CM | POA: Diagnosis present

## 2017-11-21 DIAGNOSIS — N183 Chronic kidney disease, stage 3 (moderate): Secondary | ICD-10-CM | POA: Diagnosis present

## 2017-11-21 DIAGNOSIS — N950 Postmenopausal bleeding: Secondary | ICD-10-CM | POA: Diagnosis present

## 2017-11-21 DIAGNOSIS — I080 Rheumatic disorders of both mitral and aortic valves: Secondary | ICD-10-CM | POA: Diagnosis present

## 2017-11-21 DIAGNOSIS — I351 Nonrheumatic aortic (valve) insufficiency: Secondary | ICD-10-CM

## 2017-11-21 DIAGNOSIS — E039 Hypothyroidism, unspecified: Secondary | ICD-10-CM | POA: Diagnosis present

## 2017-11-21 DIAGNOSIS — I062 Rheumatic aortic stenosis with insufficiency: Secondary | ICD-10-CM | POA: Diagnosis present

## 2017-11-21 DIAGNOSIS — I272 Pulmonary hypertension, unspecified: Secondary | ICD-10-CM | POA: Diagnosis present

## 2017-11-21 DIAGNOSIS — I10 Essential (primary) hypertension: Secondary | ICD-10-CM | POA: Diagnosis present

## 2017-11-21 DIAGNOSIS — I099 Rheumatic heart disease, unspecified: Secondary | ICD-10-CM | POA: Diagnosis present

## 2017-11-21 DIAGNOSIS — Z9889 Other specified postprocedural states: Secondary | ICD-10-CM

## 2017-11-21 DIAGNOSIS — Z87891 Personal history of nicotine dependence: Secondary | ICD-10-CM | POA: Diagnosis not present

## 2017-11-21 DIAGNOSIS — Z6841 Body Mass Index (BMI) 40.0 and over, adult: Secondary | ICD-10-CM | POA: Diagnosis not present

## 2017-11-21 DIAGNOSIS — N189 Chronic kidney disease, unspecified: Secondary | ICD-10-CM | POA: Diagnosis present

## 2017-11-21 DIAGNOSIS — I9589 Other hypotension: Secondary | ICD-10-CM | POA: Diagnosis not present

## 2017-11-21 DIAGNOSIS — E66813 Obesity, class 3: Secondary | ICD-10-CM | POA: Diagnosis present

## 2017-11-21 HISTORY — DX: Presence of prosthetic heart valve: Z95.2

## 2017-11-21 HISTORY — PX: TEE WITHOUT CARDIOVERSION: SHX5443

## 2017-11-21 HISTORY — DX: Malignant neoplasm of thyroid gland: C73

## 2017-11-21 HISTORY — PX: TRANSCATHETER AORTIC VALVE REPLACEMENT, TRANSFEMORAL: SHX6400

## 2017-11-21 HISTORY — DX: Chronic kidney disease, unspecified: N18.9

## 2017-11-21 LAB — POCT I-STAT, CHEM 8
BUN: 25 mg/dL — ABNORMAL HIGH (ref 6–20)
BUN: 24 mg/dL — AB (ref 6–20)
BUN: 24 mg/dL — ABNORMAL HIGH (ref 6–20)
BUN: 24 mg/dL — ABNORMAL HIGH (ref 6–20)
CALCIUM ION: 1.19 mmol/L (ref 1.15–1.40)
CHLORIDE: 107 mmol/L (ref 98–111)
CHLORIDE: 106 mmol/L (ref 98–111)
CHLORIDE: 106 mmol/L (ref 98–111)
CHLORIDE: 106 mmol/L (ref 98–111)
CREATININE: 1.4 mg/dL — AB (ref 0.44–1.00)
Calcium, Ion: 1.22 mmol/L (ref 1.15–1.40)
Calcium, Ion: 1.22 mmol/L (ref 1.15–1.40)
Calcium, Ion: 1.23 mmol/L (ref 1.15–1.40)
Creatinine, Ser: 1.4 mg/dL — ABNORMAL HIGH (ref 0.44–1.00)
Creatinine, Ser: 1.3 mg/dL — ABNORMAL HIGH (ref 0.44–1.00)
Creatinine, Ser: 1.3 mg/dL — ABNORMAL HIGH (ref 0.44–1.00)
Glucose, Bld: 95 mg/dL (ref 70–99)
Glucose, Bld: 123 mg/dL — ABNORMAL HIGH (ref 70–99)
Glucose, Bld: 134 mg/dL — ABNORMAL HIGH (ref 70–99)
Glucose, Bld: 126 mg/dL — ABNORMAL HIGH (ref 70–99)
HCT: 34 % — ABNORMAL LOW (ref 36.0–46.0)
HCT: 31 % — ABNORMAL LOW (ref 36.0–46.0)
HCT: 34 % — ABNORMAL LOW (ref 36.0–46.0)
HEMATOCRIT: 30 % — AB (ref 36.0–46.0)
HEMOGLOBIN: 10.2 g/dL — AB (ref 12.0–15.0)
Hemoglobin: 11.6 g/dL — ABNORMAL LOW (ref 12.0–15.0)
Hemoglobin: 10.5 g/dL — ABNORMAL LOW (ref 12.0–15.0)
Hemoglobin: 11.6 g/dL — ABNORMAL LOW (ref 12.0–15.0)
POTASSIUM: 4 mmol/L (ref 3.5–5.1)
POTASSIUM: 4.1 mmol/L (ref 3.5–5.1)
POTASSIUM: 4.1 mmol/L (ref 3.5–5.1)
POTASSIUM: 4.1 mmol/L (ref 3.5–5.1)
SODIUM: 139 mmol/L (ref 135–145)
SODIUM: 140 mmol/L (ref 135–145)
SODIUM: 142 mmol/L (ref 135–145)
Sodium: 140 mmol/L (ref 135–145)
TCO2: 21 mmol/L — AB (ref 22–32)
TCO2: 21 mmol/L — ABNORMAL LOW (ref 22–32)
TCO2: 22 mmol/L (ref 22–32)
TCO2: 19 mmol/L — ABNORMAL LOW (ref 22–32)

## 2017-11-21 SURGERY — IMPLANTATION, AORTIC VALVE, TRANSCATHETER, FEMORAL APPROACH
Anesthesia: Monitor Anesthesia Care | Site: Chest

## 2017-11-21 MED ORDER — ACETAMINOPHEN 325 MG PO TABS
650.0000 mg | ORAL_TABLET | Freq: Four times a day (QID) | ORAL | Status: DC | PRN
  Administered 2017-11-21 – 2017-11-22 (×3): 650 mg via ORAL
  Filled 2017-11-21 (×3): qty 2

## 2017-11-21 MED ORDER — LEVOTHYROXINE SODIUM 25 MCG PO TABS
125.0000 ug | ORAL_TABLET | Freq: Every day | ORAL | Status: DC
  Administered 2017-11-22 – 2017-11-23 (×2): 125 ug via ORAL
  Filled 2017-11-21 (×2): qty 1

## 2017-11-21 MED ORDER — IODIXANOL 320 MG/ML IV SOLN
INTRAVENOUS | Status: DC | PRN
  Administered 2017-11-21: 39.1 mL via INTRAVENOUS

## 2017-11-21 MED ORDER — CHLORHEXIDINE GLUCONATE 4 % EX LIQD: 60.0000 mL | Freq: Once | CUTANEOUS | Status: DC

## 2017-11-21 MED ORDER — NITROGLYCERIN IN D5W 200-5 MCG/ML-% IV SOLN: 0.0000 ug/min | INTRAVENOUS | Status: DC

## 2017-11-21 MED ORDER — DEXAMETHASONE SODIUM PHOSPHATE 10 MG/ML IJ SOLN
INTRAMUSCULAR | Status: DC | PRN
  Administered 2017-11-21: 5 mg via INTRAVENOUS

## 2017-11-21 MED ORDER — SODIUM CHLORIDE 0.9 % IV SOLN: INTRAVENOUS | Status: AC

## 2017-11-21 MED ORDER — CHLORHEXIDINE GLUCONATE 4 % EX LIQD: 30.0000 mL | CUTANEOUS | Status: DC

## 2017-11-21 MED ORDER — ALBUTEROL SULFATE (2.5 MG/3ML) 0.083% IN NEBU: 3.0000 mL | INHALATION_SOLUTION | Freq: Four times a day (QID) | RESPIRATORY_TRACT | Status: DC | PRN

## 2017-11-21 MED ORDER — CLOPIDOGREL BISULFATE 75 MG PO TABS
75.0000 mg | ORAL_TABLET | Freq: Every day | ORAL | Status: DC
  Administered 2017-11-22 – 2017-11-23 (×2): 75 mg via ORAL
  Filled 2017-11-21 (×2): qty 1

## 2017-11-21 MED ORDER — PROPOFOL 500 MG/50ML IV EMUL
INTRAVENOUS | Status: DC | PRN
  Administered 2017-11-21: 20 ug/kg/min via INTRAVENOUS

## 2017-11-21 MED ORDER — ASPIRIN 81 MG PO CHEW
81.0000 mg | CHEWABLE_TABLET | Freq: Every day | ORAL | Status: DC
  Administered 2017-11-22 – 2017-11-23 (×2): 81 mg via ORAL
  Filled 2017-11-21 (×2): qty 1

## 2017-11-21 MED ORDER — 0.9 % SODIUM CHLORIDE (POUR BTL) OPTIME
TOPICAL | Status: DC | PRN
  Administered 2017-11-21: 1000 mL

## 2017-11-21 MED ORDER — LIDOCAINE HCL (PF) 1 % IJ SOLN
INTRAMUSCULAR | Status: AC
  Filled 2017-11-21: qty 30

## 2017-11-21 MED ORDER — DEXAMETHASONE SODIUM PHOSPHATE 10 MG/ML IJ SOLN
INTRAMUSCULAR | Status: AC
  Filled 2017-11-21: qty 1

## 2017-11-21 MED ORDER — ONDANSETRON HCL 4 MG/2ML IJ SOLN: 4.0000 mg | Freq: Four times a day (QID) | INTRAMUSCULAR | Status: DC | PRN

## 2017-11-21 MED ORDER — HEPARIN SODIUM (PORCINE) 1000 UNIT/ML IJ SOLN
INTRAMUSCULAR | Status: DC | PRN
  Administered 2017-11-21: 9000 [IU] via INTRAVENOUS

## 2017-11-21 MED ORDER — CEFUROXIME SODIUM 1.5 G IV SOLR
1.5000 g | Freq: Two times a day (BID) | INTRAVENOUS | Status: AC
  Administered 2017-11-21 – 2017-11-23 (×4): 1.5 g via INTRAVENOUS
  Filled 2017-11-21 (×4): qty 1.5

## 2017-11-21 MED ORDER — PROTAMINE SULFATE 10 MG/ML IV SOLN
INTRAVENOUS | Status: DC | PRN
  Administered 2017-11-21: 90 mg via INTRAVENOUS

## 2017-11-21 MED ORDER — WHITE PETROLATUM EX OINT
TOPICAL_OINTMENT | CUTANEOUS | Status: AC
  Administered 2017-11-21: 22:00:00
  Filled 2017-11-21: qty 28.35

## 2017-11-21 MED ORDER — LACTATED RINGERS IV SOLN
INTRAVENOUS | Status: DC | PRN
  Administered 2017-11-21: 10:00:00 via INTRAVENOUS

## 2017-11-21 MED ORDER — ONDANSETRON HCL 4 MG/2ML IJ SOLN
INTRAMUSCULAR | Status: AC
  Filled 2017-11-21: qty 2

## 2017-11-21 MED ORDER — MIDAZOLAM HCL 2 MG/2ML IJ SOLN
INTRAMUSCULAR | Status: AC
  Filled 2017-11-21: qty 2

## 2017-11-21 MED ORDER — FENTANYL CITRATE (PF) 100 MCG/2ML IJ SOLN
INTRAMUSCULAR | Status: AC
  Filled 2017-11-21: qty 2

## 2017-11-21 MED ORDER — SODIUM CHLORIDE 0.9% FLUSH
3.0000 mL | Freq: Two times a day (BID) | INTRAVENOUS | Status: DC
  Administered 2017-11-21 – 2017-11-23 (×3): 3 mL via INTRAVENOUS

## 2017-11-21 MED ORDER — HEPARIN SODIUM (PORCINE) 5000 UNIT/ML IJ SOLN
INTRAMUSCULAR | Status: AC
  Filled 2017-11-21 (×3): qty 1.2

## 2017-11-21 MED ORDER — SODIUM CHLORIDE 0.9 % IV SOLN
INTRAVENOUS | Status: DC
  Administered 2017-11-21: 10:00:00 via INTRAVENOUS

## 2017-11-21 MED ORDER — DOCUSATE SODIUM 100 MG PO CAPS
100.0000 mg | ORAL_CAPSULE | Freq: Two times a day (BID) | ORAL | Status: DC
  Administered 2017-11-21 – 2017-11-23 (×4): 100 mg via ORAL
  Filled 2017-11-21 (×5): qty 1

## 2017-11-21 MED ORDER — CHLORHEXIDINE GLUCONATE 0.12 % MT SOLN
15.0000 mL | Freq: Once | OROMUCOSAL | Status: AC
  Administered 2017-11-21: 15 mL via OROMUCOSAL
  Filled 2017-11-21: qty 15

## 2017-11-21 MED ORDER — ROSUVASTATIN CALCIUM 10 MG PO TABS
20.0000 mg | ORAL_TABLET | Freq: Every day | ORAL | Status: DC
  Administered 2017-11-21 – 2017-11-22 (×2): 20 mg via ORAL
  Filled 2017-11-21 (×2): qty 2

## 2017-11-21 MED ORDER — ONDANSETRON HCL 4 MG/2ML IJ SOLN
INTRAMUSCULAR | Status: DC | PRN
  Administered 2017-11-21: 4 mg via INTRAVENOUS

## 2017-11-21 MED ORDER — SODIUM CHLORIDE 0.9 % IV BOLUS: 250.0000 mL | Freq: Once | INTRAVENOUS | Status: AC

## 2017-11-21 MED ORDER — BUDESONIDE 0.25 MG/2ML IN SUSP
0.2500 mg | Freq: Two times a day (BID) | RESPIRATORY_TRACT | Status: DC
  Administered 2017-11-21 – 2017-11-23 (×3): 0.25 mg via RESPIRATORY_TRACT
  Filled 2017-11-21 (×4): qty 2

## 2017-11-21 MED ORDER — ACETAMINOPHEN 650 MG RE SUPP
650.0000 mg | Freq: Four times a day (QID) | RECTAL | Status: DC | PRN
Start: 2017-11-21 — End: 2017-11-23

## 2017-11-21 MED ORDER — FENTANYL CITRATE (PF) 100 MCG/2ML IJ SOLN: 25.0000 ug | INTRAMUSCULAR | Status: DC | PRN

## 2017-11-21 MED ORDER — SODIUM CHLORIDE 0.9% FLUSH: 3.0000 mL | INTRAVENOUS | Status: DC | PRN

## 2017-11-21 MED ORDER — MORPHINE SULFATE (PF) 2 MG/ML IV SOLN: 2.0000 mg | INTRAVENOUS | Status: DC | PRN

## 2017-11-21 MED ORDER — TRAMADOL HCL 50 MG PO TABS: 50.0000 mg | ORAL_TABLET | ORAL | Status: DC | PRN

## 2017-11-21 MED ORDER — OXYCODONE HCL 5 MG PO TABS
5.0000 mg | ORAL_TABLET | ORAL | Status: DC | PRN
  Administered 2017-11-22: 5 mg via ORAL
  Filled 2017-11-21: qty 1

## 2017-11-21 MED ORDER — METOPROLOL TARTRATE 5 MG/5ML IV SOLN: 2.5000 mg | INTRAVENOUS | Status: DC | PRN

## 2017-11-21 MED ORDER — LIDOCAINE HCL 1 % IJ SOLN
INTRAMUSCULAR | Status: DC | PRN
  Administered 2017-11-21: 7 mL

## 2017-11-21 MED ORDER — PHENYLEPHRINE HCL-NACL 20-0.9 MG/250ML-% IV SOLN
0.0000 ug/min | INTRAVENOUS | Status: DC
  Filled 2017-11-21: qty 250

## 2017-11-21 MED ORDER — SODIUM CHLORIDE 0.9 % IV SOLN
250.0000 mL | INTRAVENOUS | Status: DC | PRN
  Administered 2017-11-22 (×2): 250 mL via INTRAVENOUS

## 2017-11-21 MED ORDER — VANCOMYCIN HCL IN DEXTROSE 1-5 GM/200ML-% IV SOLN
1000.0000 mg | Freq: Once | INTRAVENOUS | Status: AC
  Administered 2017-11-21: 1000 mg via INTRAVENOUS
  Filled 2017-11-21: qty 200

## 2017-11-21 MED ORDER — LACTATED RINGERS IV SOLN
INTRAVENOUS | Status: DC | PRN
  Administered 2017-11-21: 11:00:00 via INTRAVENOUS

## 2017-11-21 MED ORDER — DIPHENHYDRAMINE HCL 25 MG PO CAPS
25.0000 mg | ORAL_CAPSULE | Freq: Every evening | ORAL | Status: DC | PRN
  Administered 2017-11-21: 25 mg via ORAL
  Filled 2017-11-21: qty 1

## 2017-11-21 SURGICAL SUPPLY — 95 items
ADH SKN CLS APL DERMABOND .7 (GAUZE/BANDAGES/DRESSINGS) ×2
BAG DECANTER FOR FLEXI CONT (MISCELLANEOUS) IMPLANT
BAG SNAP BAND KOVER 36X36 (MISCELLANEOUS) ×4 IMPLANT
BLADE CLIPPER SURG (BLADE) IMPLANT
BLADE STERNUM SYSTEM 6 (BLADE) IMPLANT
CABLE ADAPT CONN TEMP 6FT (ADAPTER) ×4 IMPLANT
CANISTER SUCT 3000ML PPV (MISCELLANEOUS) IMPLANT
CANNULA FEM VENOUS REMOTE 22FR (CANNULA) IMPLANT
CANNULA OPTISITE PERFUSION 16F (CANNULA) IMPLANT
CANNULA OPTISITE PERFUSION 18F (CANNULA) IMPLANT
CATH ANGIO 5F BER2 100CM (CATHETERS) ×2 IMPLANT
CATH DIAG EXPO 6F VENT PIG 145 (CATHETERS) ×8 IMPLANT
CATH EXPO 5FR AL1 (CATHETERS) IMPLANT
CATH INFINITI 6F AL2 (CATHETERS) IMPLANT
CATH S G BIP PACING (SET/KITS/TRAYS/PACK) ×4 IMPLANT
CLIP VESOCCLUDE MED 24/CT (CLIP) ×2 IMPLANT
CLIP VESOCCLUDE SM WIDE 24/CT (CLIP) ×2 IMPLANT
CONT SPEC 4OZ CLIKSEAL STRL BL (MISCELLANEOUS) ×10 IMPLANT
COVER BACK TABLE 80X110 HD (DRAPES) ×6 IMPLANT
COVER DOME SNAP 22 D (MISCELLANEOUS) IMPLANT
CRADLE DONUT ADULT HEAD (MISCELLANEOUS) ×4 IMPLANT
DERMABOND ADVANCED (GAUZE/BANDAGES/DRESSINGS) ×2
DERMABOND ADVANCED .7 DNX12 (GAUZE/BANDAGES/DRESSINGS) ×2 IMPLANT
DEVICE CLOSURE PERCLS PRGLD 6F (VASCULAR PRODUCTS) ×4 IMPLANT
DRAPE INCISE IOBAN 66X45 STRL (DRAPES) IMPLANT
DRSG TEGADERM 4X4.75 (GAUZE/BANDAGES/DRESSINGS) ×8 IMPLANT
ELECT CAUTERY BLADE 6.4 (BLADE) IMPLANT
ELECT REM PT RETURN 9FT ADLT (ELECTROSURGICAL) ×4
ELECTRODE REM PT RTRN 9FT ADLT (ELECTROSURGICAL) ×4 IMPLANT
FELT TEFLON 6X6 (MISCELLANEOUS) ×2 IMPLANT
FEMORAL VENOUS CANN RAP (CANNULA) IMPLANT
GAUZE SPONGE 4X4 12PLY STRL (GAUZE/BANDAGES/DRESSINGS) ×4 IMPLANT
GAUZE SPONGE 4X4 12PLY STRL LF (GAUZE/BANDAGES/DRESSINGS) ×2 IMPLANT
GLOVE BIO SURGEON STRL SZ7.5 (GLOVE) IMPLANT
GLOVE BIO SURGEON STRL SZ8 (GLOVE) ×2 IMPLANT
GLOVE BIOGEL PI IND STRL 6.5 (GLOVE) IMPLANT
GLOVE BIOGEL PI INDICATOR 6.5 (GLOVE) ×4
GLOVE EUDERMIC 7 POWDERFREE (GLOVE) IMPLANT
GLOVE ORTHO TXT STRL SZ7.5 (GLOVE) ×2 IMPLANT
GLOVE SURG SS PI 7.0 STRL IVOR (GLOVE) ×2 IMPLANT
GOWN STRL REUS W/ TWL LRG LVL3 (GOWN DISPOSABLE) IMPLANT
GOWN STRL REUS W/ TWL XL LVL3 (GOWN DISPOSABLE) ×2 IMPLANT
GOWN STRL REUS W/TWL LRG LVL3 (GOWN DISPOSABLE) ×8
GOWN STRL REUS W/TWL XL LVL3 (GOWN DISPOSABLE) ×16
GUIDEWIRE SAF TJ AMPL .035X180 (WIRE) ×4 IMPLANT
GUIDEWIRE SAFE TJ AMPLATZ EXST (WIRE) ×4 IMPLANT
GUIDEWIRE STRAIGHT .035 260CM (WIRE) ×4 IMPLANT
INSERT FOGARTY SM (MISCELLANEOUS) IMPLANT
KIT BASIN OR (CUSTOM PROCEDURE TRAY) ×4 IMPLANT
KIT DILATOR VASC 18G NDL (KITS) IMPLANT
KIT HEART LEFT (KITS) ×4 IMPLANT
KIT SUCTION CATH 14FR (SUCTIONS) ×4 IMPLANT
KIT TURNOVER KIT B (KITS) ×4 IMPLANT
LOOP VESSEL MAXI BLUE (MISCELLANEOUS) IMPLANT
LOOP VESSEL MINI RED (MISCELLANEOUS) IMPLANT
NDL PERC 18GX7CM (NEEDLE) ×2 IMPLANT
NEEDLE PERC 18GX7CM (NEEDLE) ×4 IMPLANT
NS IRRIG 1000ML POUR BTL (IV SOLUTION) ×8 IMPLANT
PACK ENDOVASCULAR (PACKS) ×4 IMPLANT
PAD ARMBOARD 7.5X6 YLW CONV (MISCELLANEOUS) ×8 IMPLANT
PAD ELECT DEFIB RADIOL ZOLL (MISCELLANEOUS) ×4 IMPLANT
PENCIL BUTTON HOLSTER BLD 10FT (ELECTRODE) ×4 IMPLANT
PERCLOSE PROGLIDE 6F (VASCULAR PRODUCTS) ×8
SET MICROPUNCTURE 5F STIFF (MISCELLANEOUS) ×4 IMPLANT
SHEATH BRITE TIP 6FR 35CM (SHEATH) ×4 IMPLANT
SHEATH PINNACLE 6F 10CM (SHEATH) IMPLANT
SHEATH PINNACLE 8F 10CM (SHEATH) ×4 IMPLANT
SLEEVE REPOSITIONING LENGTH 30 (MISCELLANEOUS) ×4 IMPLANT
SPONGE LAP 4X18 RFD (DISPOSABLE) ×2 IMPLANT
STOPCOCK MORSE 400PSI 3WAY (MISCELLANEOUS) ×8 IMPLANT
SUT ETHIBOND X763 2 0 SH 1 (SUTURE) IMPLANT
SUT GORETEX CV 4 TH 22 36 (SUTURE) IMPLANT
SUT GORETEX CV4 TH-18 (SUTURE) IMPLANT
SUT MNCRL AB 3-0 PS2 18 (SUTURE) IMPLANT
SUT PROLENE 5 0 C 1 36 (SUTURE) IMPLANT
SUT PROLENE 6 0 C 1 30 (SUTURE) IMPLANT
SUT SILK  1 MH (SUTURE) ×2
SUT SILK 1 MH (SUTURE) ×2 IMPLANT
SUT VIC AB 2-0 CT1 27 (SUTURE)
SUT VIC AB 2-0 CT1 TAPERPNT 27 (SUTURE) IMPLANT
SUT VIC AB 2-0 CTX 36 (SUTURE) IMPLANT
SUT VIC AB 3-0 SH 8-18 (SUTURE) IMPLANT
SYR 50ML LL SCALE MARK (SYRINGE) ×4 IMPLANT
SYR BULB IRRIGATION 50ML (SYRINGE) IMPLANT
SYR CONTROL 10ML LL (SYRINGE) ×2 IMPLANT
TAPE CLOTH SURG 4X10 WHT LF (GAUZE/BANDAGES/DRESSINGS) ×2 IMPLANT
TOWEL GREEN STERILE (TOWEL DISPOSABLE) ×8 IMPLANT
TRANSDUCER W/STOPCOCK (MISCELLANEOUS) ×8 IMPLANT
TRAY FOLEY SLVR 14FR TEMP STAT (SET/KITS/TRAYS/PACK) IMPLANT
TUBE SUCT INTRACARD DLP 20F (MISCELLANEOUS) IMPLANT
VALVE HEART TRANSCATH SZ3 23MM (Prosthesis & Implant Heart) ×2 IMPLANT
WIRE .035 3MM-J 145CM (WIRE) ×4 IMPLANT
WIRE BENTSON .035X145CM (WIRE) ×4 IMPLANT
WIRE EMERALD 3MM-J .035X150CM (WIRE) ×2 IMPLANT
WIRE TORQFLEX AUST .018X40CM (WIRE) ×2 IMPLANT

## 2017-11-21 NOTE — Progress Notes (Signed)
Site area: rt brachial arterial line Site Prior to Removal:  Level 0 Pressure Applied For: 20 minutes Manual:   yes Patient Status During Pull:  stable Post Pull Site:  Level 0 Post Pull Instructions Given:  yes Post Pull Pulses Present: rt radial palpable Dressing Applied:  Gauze, tegaderm and coban Bedrest begins @  Comments:

## 2017-11-21 NOTE — Op Note (Signed)
HEART AND VASCULAR CENTER   MULTIDISCIPLINARY HEART VALVE TEAM   TAVR OPERATIVE NOTE   Date of Procedure:  11/21/2017  Preoperative Diagnosis: Severe aortic insufficiency and aortic stenosis  Postoperative Diagnosis: Same   Procedure:    Transcatheter Aortic Valve Replacement - Percutaneous Right Transfemoral Approach  Edwards Sapien 3 THV (size 23 mm, model # 9600TFX, serial # [id number])   Co-Surgeons:  Valentina Gu. 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 During the course of the patient's preoperative work up they have been evaluated comprehensively by a multidisciplinary team of specialists coordinated through the [facility] in the [facility].  They have been demonstrated to suffer from symptomatic severe aortic insufficiency and aortic stenosis. The patient has been counseled extensively as to the relative risks and benefits of all options for the treatment of severe aortic stenosis including long term medical therapy, conventional surgery for aortic valve replacement, and transcatheter aortic valve replacement.  All questions have been answered, and the  patient provides full informed consent for the operation as described.   DETAILS OF THE OPERATIVE PROCEDURE  PREPARATION:    The patient is brought to the operating room on the above mentioned date and central monitoring was established by the anesthesia team including placement of a central venous line and radial arterial line. The patient is placed in the supine position on the operating table.  Intravenous antibiotics are administered. The patient is monitored closely throughout the procedure under conscious sedation.  Baseline transthoracic echocardiogram was performed. The patient's chest, abdomen, both groins, and both lower extremities are prepared and draped in a sterile manner. A time out procedure is performed.   PERIPHERAL ACCESS:    Using the modified Seldinger technique, femoral arterial and venous access was obtained with placement of 6 Fr sheaths on the left side.  A pigtail diagnostic catheter was passed through the left arterial sheath under fluoroscopic guidance into the aortic root.  A temporary transvenous pacemaker catheter was passed through the left femoral venous sheath under fluoroscopic guidance into the right ventricle.  The pacemaker was tested to ensure stable lead placement and pacemaker capture. Aortic root angiography was performed in order to determine the optimal angiographic angle for valve deployment.   TRANSFEMORAL ACCESS:   Percutaneous transfemoral access and sheath placement was performed by Dr. Burt Knack using ultrasound guidance.  The right common femoral artery was cannulated using a micropuncture needle and appropriate location was verified using hand injection angiogram.  A pair of Abbott Perclose percutaneous closure devices were placed and a 6 French sheath replaced into the femoral artery.  The patient was heparinized systemically and ACT verified > 250 seconds.    A 14 Fr transfemoral E-sheath was introduced into the right common femoral artery after  progressively dilating over an Amplatz superstiff wire. An AL-2 catheter was used  to direct a straight-tip exchange length wire across the native aortic valve into the left ventricle. This was exchanged out for a pigtail catheter and position was confirmed in the LV apex.  The pigtail catheter was exchanged for an Amplatz Extra-stiff wire in the LV apex.  Echocardiography was utilized to confirm appropriate wire position and no sign of entanglement in the mitral subvalvular apparatus.   TRANSCATHETER HEART VALVE DEPLOYMENT:   An Edwards Sapien 3 transcatheter heart valve (size 23 mm, model #9600TFX, serial #[id number]) was prepared and crimped per manufacturer's guidelines, and the proper orientation of the valve is confirmed on the [employer] delivery system. The valve was advanced through the introducer sheath using normal technique until in an appropriate position in the abdominal aorta beyond the sheath tip. The balloon was then retracted and using the fine-tuning wheel was centered on the valve. The valve was then advanced across the aortic arch using appropriate flexion of the catheter. The valve was carefully positioned across the aortic valve annulus. The Commander catheter was retracted using normal technique. Once final position of the valve has been confirmed by angiographic assessment, the valve is deployed while temporarily holding ventilation and during rapid ventricular pacing to maintain systolic blood pressure < 50 mmHg and pulse pressure < 10 mmHg. The balloon inflation is held for >3 seconds after reaching full deployment volume. Once the balloon has fully deflated the balloon is retracted into the ascending aorta and valve function is assessed using echocardiography. There is felt to be no paravalvular leak and no central aortic insufficiency.  The patient's hemodynamic recovery following valve deployment is good.  The deployment balloon and guidewire are both removed.    PROCEDURE  COMPLETION:   The sheath was removed and femoral artery closure performed by Dr Burt Knack.  Protamine was administered once femoral arterial repair was complete. The temporary pacemaker, pigtail catheters and femoral sheaths were removed with manual pressure used for hemostasis.   The patient tolerated the procedure well and is transported to the surgical intensive care in stable condition. There were no immediate intraoperative complications. All sponge instrument and needle counts are verified correct at completion of the operation.   No blood products were administered during the operation.  The patient received a total of 39.1 mL of intravenous contrast during the procedure.   Rexene Alberts, MD 11/21/2017 12:17 PM

## 2017-11-21 NOTE — Anesthesia Procedure Notes (Signed)
Procedure Name: MAC Date/Time: 11/21/2017 10:37 AM Performed by: Barrington Ellison, CRNA Pre-anesthesia Checklist: Patient identified, Emergency Drugs available, Suction available, Patient being monitored and Timeout performed Patient Re-evaluated:Patient Re-evaluated prior to induction Oxygen Delivery Method: Simple face mask

## 2017-11-21 NOTE — Anesthesia Procedure Notes (Signed)
Central Venous Catheter Insertion Performed by: Roberts Gaudy, MD, anesthesiologist Start/End7/06/2017 9:40 AM, 11/21/2017 9:50 AM Patient location: Pre-op. Preanesthetic checklist: patient identified, IV checked, site marked, risks and benefits discussed, surgical consent, monitors and equipment checked, pre-op evaluation, timeout performed and anesthesia consent Lidocaine 1% used for infiltration and patient sedated Hand hygiene performed  and maximum sterile barriers used  Catheter size: 8 Fr Total catheter length 16. Central line was placed.Double lumen Procedure performed using ultrasound guided technique. Ultrasound Notes:image(s) printed for medical record Attempts: 1 Following insertion, dressing applied and line sutured. Post procedure assessment: blood return through all ports  Patient tolerated the procedure well with no immediate complications.

## 2017-11-21 NOTE — Interval H&P Note (Signed)
History and Physical Interval Note:  11/21/2017 10:58 AM  [name]  has presented today for surgery, with the diagnosis of Severe Aortic Insufficiency  The various methods of treatment have been discussed with the patient and family. After consideration of risks, benefits and other options for treatment, the patient has consented to  Procedure(s): TRANSCATHETER AORTIC VALVE REPLACEMENT, TRANSFEMORAL using a 20mm Edwards Sapien 3 Aortic Valve (N/A) TRANSESOPHAGEAL ECHOCARDIOGRAM (TEE) (N/A) as a surgical intervention .  The patient's history has been reviewed, patient examined, no change in status, stable for surgery.  I have reviewed the patient's chart and labs.  Questions were answered to the patient's satisfaction.     [facility]   MULTIDISCIPLINARY HEART VALVE TEAM   History and Physical Interval Note:  11/21/2017, 10:58 AM   [name] has presented today for surgery, with the diagnosis of severe aortic stenosis  The various methods of treatment have previously been discussed with the patient and family. After consideration of risks, benefits and other options for treatment, the patient has consented to  Procedure(s): Procedure(s): TRANSCATHETER AORTIC VALVE REPLACEMENT, TRANSFEMORAL using a 16mm Edwards Sapien 3 Aortic Valve (N/A) TRANSESOPHAGEAL ECHOCARDIOGRAM (TEE) (N/A) as a surgical intervention.   The patient reports the following:   Shortness of breath: Yes.   If yes: with what activity?:  Worse than previously noted?: Yes.    New edema, PND, orthopnea: No.  Recent decrease in activity or worsening fatigue i.e. more difficulty walking to mailbox, climbing stairs, etc: Yes.    Changes since last seen in pre-op visit: No.      Component Value Date/Time   BNP 404.8 (H) 11/17/2017 1426    The patient's history has been reviewed, patient briefly examined, no change in status (or as listed above), stable for surgery.  I have reviewed the  patient's chart and labs. Questions were answered to the patient's satisfaction.    Sherren Mocha MD

## 2017-11-21 NOTE — Progress Notes (Signed)
Patient up to chair after bed rest completed. x1 patient states she "feels good" sitting in chair. Pt bilateral groins soft without bleeding or hematoma noted at this time. Will continue to monitor patient. Lailani Tool, Bettina Gavia RN

## 2017-11-21 NOTE — Progress Notes (Addendum)
.   [place] VALVE TEAM  Patient examined in the Cath Lab holding area. She is hemodynamically stable.  ECG shows normal sinus rhythm with no evidence of high-grade block. Labs are at her baseline. Groin sites examined. No hematoma.  She is chronically hypotensive and initially required small doses of pressors. She was given 250 cc bolus and was able to come off drips. She has been transferred to 4 E Stepdown. Having some issues with urinary retention. We can bladder scan and in and out cath if needed.   Angelena Form PA-C  [facility]

## 2017-11-21 NOTE — Progress Notes (Signed)
  Echocardiogram 2D Echocardiogram Limited has been performed.  Darlina Sicilian M 11/21/2017, 12:12 PM

## 2017-11-21 NOTE — Progress Notes (Addendum)
Patient arrive from cathlab/ OR Patient placed on monitor and vital signs obtained. Patient with complaints of unable to void/ bladder feeling full/ pressure. Angelena Form [facility] on floor and in room stated to bladder scan patient and ok to I and O patient. Bladder scan was done and >550 found in bladder. I And O patient and received 650 mL clear yellow urine. Patient states she "feels much better now" will continue to monitor patient. Treyton Slimp, Bettina Gavia RN

## 2017-11-21 NOTE — Op Note (Signed)
HE[facility] AND VASCULAR CENTER   MULTIDISCIPLINARY HEART VALVE TEAM   TAVR OPERATIVE NOTE   Date of Procedure:  11/21/2017  Preoperative Diagnosis: Severe Aortic Stenosis   Postoperative Diagnosis: Same   Procedure:   Transcatheter Aortic Valve Replacement - Percutaneous Transfemoral Approach  Edwards Sapien 3 THV (size 23 mm, model # 9600TFX, serial # [id number])   Co-Surgeons:  Valentina Gu. Roxy Manns, MD and Sherren Mocha, MD  Anesthesiologist:  Suzette Battiest, MD  Echocardiographer:  Ena Dawley, MD  Pre-operative Echo Findings:  Severe aortic stenosis and insufficiency  Normal left ventricular systolic function  Post-operative Echo Findings:  Trivial paravalvular leak  Unchanged/normal left ventricular systolic function  BRIEF CLINICAL NOTE AND INDICATIONS FOR SURGERY  57 year old woman with long-standing combined systolic and diastolic heart failure secondary to rheumatic heart disease.  She has undergone aortic valve repair and mitral valve repair in 2010.  The patient has developed severe aortic stenosis and insufficiency with New York Heart Association functional class III symptoms.  After undergoing extensive imaging studies and preoperative evaluation, including multidisciplinary heart valve team evaluation, she is referred for TAVR.  During the course of the patient's preoperative work up they have been evaluated comprehensively by a multidisciplinary team of specialists coordinated through the [facility] in the [place] [facility].  They have been demonstrated to suffer from symptomatic severe aortic stenosis as noted above. The patient has been counseled extensively as to the relative risks and benefits of all options for the treatment of severe aortic stenosis including long term medical therapy, conventional surgery for aortic valve replacement, and transcatheter aortic valve replacement.  The patient has been independently  evaluated by two cardiac surgeons including Dr. Roxy Manns and Dr. Cyndia Bent, and they are felt to be at moderate risk for conventional surgical aortic valve replacement. Both surgeons indicated the patient would be a poor candidate for conventional surgery because of comorbidities including previous cardiac surgery, obesity, congestive heart failure, and poor functional capacity.   Based upon review of all of the patient's preoperative diagnostic tests they are felt to be candidate for transcatheter aortic valve replacement using the transfemoral approach as an alternative to high risk conventional surgery.    Following the decision to proceed with transcatheter aortic valve replacement, a discussion has been held regarding what types of management strategies would be attempted intraoperatively in the event of life-threatening complications, including whether or not the patient would be considered a candidate for the use of cardiopulmonary bypass and/or conversion to open sternotomy for attempted surgical intervention.  The patient has been advised of a variety of complications that might develop peculiar to this approach including but not limited to risks of death, stroke, paravalvular leak, aortic dissection or other major vascular complications, aortic annulus rupture, device embolization, cardiac rupture or perforation, acute myocardial infarction, arrhythmia, heart block or bradycardia requiring permanent pacemaker placement, congestive heart failure, respiratory failure, renal failure, pneumonia, infection, other late complications related to structural valve deterioration or migration, or other complications that might ultimately cause a temporary or permanent loss of functional independence or other long term morbidity.  The patient provides full informed consent for the procedure as described and all questions were answered preoperatively.  DETAILS OF THE OPERATIVE PROCEDURE  PREPARATION:   The patient is  brought to the operating room on the above mentioned date and central monitoring was established by the anesthesia team including placement of a central venous catheter and radial arterial line. The patient is placed in  the supine position on the operating table.  Intravenous antibiotics are administered. The patient is monitored closely throughout the procedure under conscious sedation.  Baseline transthoracic echocardiogram is performed. The patient's chest, abdomen, both groins, and both lower extremities are prepared and draped in a sterile manner. A time out procedure is performed.   PERIPHERAL ACCESS:   Using ultrasound guidance, femoral arterial and venous access is obtained with placement of 6 Fr sheaths on the left side.  [place] images are captured and stored in the patient's chart. A pigtail diagnostic catheter was passed through the femoral arterial sheath under fluoroscopic guidance into the aortic root.  A temporary transvenous pacemaker catheter was passed through the femoral venous sheath under fluoroscopic guidance into the right ventricle.  The pacemaker was tested to ensure stable lead placement and pacemaker capture. Aortic root angiography was performed in order to determine the optimal angiographic angle for valve deployment.  TRANSFEMORAL ACCESS:  A micropuncture technique is used to access the right femoral artery under fluoroscopic and ultrasound guidance.  2 Perclose devices are deployed at 10' and 2' positions to 'PreClose' the femoral artery. An 8 French sheath is placed and then an Amplatz Superstiff wire is advanced through the sheath. This is changed out for a 14 French transfemoral E-Sheath after progressively dilating over the Superstiff wire.  An AL-1 catheter was used to direct a straight-tip exchange length wire across the native aortic valve into the left ventricle. This was exchanged out for a pigtail catheter and position was confirmed in the LV apex. The pigtail catheter  was exchanged for an Amplatz Extra-stiff wire in the LV apex.    BALLOON AORTIC VALVULOPLASTY:  Not performed   TRANSCATHETER HEART VALVE DEPLOYMENT:  An Edwards Sapien 3 transcatheter heart valve (size 23 mm, model #9600TFX, serial #[id number]) was prepared and crimped per manufacturer's guidelines, and the proper orientation of the valve is confirmed on the [employer] delivery system. The valve was advanced through the introducer sheath using normal technique until in an appropriate position in the abdominal aorta beyond the sheath tip. The balloon was then retracted and using the fine-tuning wheel was centered on the valve. The valve was then advanced across the aortic arch using appropriate flexion of the catheter. The valve was carefully positioned across the aortic valve annulus. The Commander catheter was retracted using normal technique. Once final position of the valve has been confirmed by angiographic assessment, the valve is deployed while temporarily holding ventilation and during rapid ventricular pacing to maintain systolic blood pressure < 50 mmHg and pulse pressure < 10 mmHg. The balloon inflation is held for >3 seconds after reaching full deployment volume. Once the balloon has fully deflated the balloon is retracted into the ascending aorta and valve function is assessed using echocardiography. There is felt to be no paravalvular leak and no central aortic insufficiency.  The patient's hemodynamic recovery following valve deployment is good.  The deployment balloon and guidewire are both removed. Echo demostrated acceptable post-procedural gradients, stable mitral valve function, and no aortic insufficiency.    PROCEDURE COMPLETION:  The sheath was removed and femoral artery closure is performed using the 2 previously deployed Perclose devices.  Protamine is administered once femoral arterial repair was complete. The site is clear with no evidence of bleeding or hematoma after the  sutures are tightened. The temporary pacemaker, pigtail catheters and femoral sheaths were removed with manual pressure used for hemostasis.   The patient tolerated the procedure well and is transported to  the surgical intensive care in stable condition. There were no immediate intraoperative complications. All sponge instrument and needle counts are verified correct at completion of the operation.   The patient received a total of 39.1 mL of intravenous contrast during the procedure.   Sherren Mocha, MD 11/21/2017 12:02 PM

## 2017-11-21 NOTE — Transfer of Care (Signed)
Immediate Anesthesia Transfer of Care Note  Patient: Government social research officer  Procedure(s) Performed: TRANSCATHETER AORTIC VALVE REPLACEMENT, TRANSFEMORAL using a 39m Edwards Sapien 3 Aortic Valve (N/A Chest) TRANSESOPHAGEAL ECHOCARDIOGRAM (TEE) (N/A )  Patient Location: Cath Lab  Anesthesia Type:MAC  Level of Consciousness: drowsy and patient cooperative  Airway & Oxygen Therapy: Patient Spontanous Breathing and Patient connected to nasal cannula oxygen  Post-op Assessment: Report given to RN  Post vital signs: Reviewed and stable  Last Vitals:  Vitals Value Taken Time  BP 78/60 11/21/2017 12:58 PM  Temp    Pulse 51 11/21/2017 12:58 PM  Resp 16 11/21/2017 12:58 PM  SpO2 100 % 11/21/2017 12:58 PM  Vitals shown include unvalidated device data.  Last Pain:  Vitals:   11/21/17 1255  TempSrc: Temporal  PainSc: 0-No pain      Patients Stated Pain Goal: 3 [id number]  Complications: No apparent anesthesia complications

## 2017-11-21 NOTE — Anesthesia Preprocedure Evaluation (Addendum)
Anesthesia Evaluation  Patient identified by MRN, date of birth, ID band Patient awake    Reviewed: Allergy & Precautions, NPO status , Patient's Chart, lab work & pertinent test results, reviewed documented beta blocker date and time   Airway Mallampati: II  TM Distance: >3 FB Neck ROM: Full    Dental   Pulmonary former smoker,    breath sounds clear to auscultation       Cardiovascular hypertension, Pt. on medications and Pt. on home beta blockers +CHF  + Valvular Problems/Murmurs AI and AS  Rhythm:Regular Rate:Normal     Neuro/Psych Depression negative neurological ROS     GI/Hepatic negative GI ROS, Neg liver ROS,   Endo/Other  Hypothyroidism Morbid obesity  Renal/GU Renal InsufficiencyRenal disease     Musculoskeletal   Abdominal   Peds  Hematology  (+) anemia ,   Anesthesia Other Findings   Reproductive/Obstetrics                             Lab Results  Component Value Date   WBC 6.7 11/17/2017   HGB 11.6 (L) 11/17/2017   HCT 37.3 11/17/2017   MCV 91.6 11/17/2017   PLT 153 11/17/2017   Lab Results  Component Value Date   CREATININE 1.41 (H) 11/17/2017   BUN 24 (H) 11/17/2017   NA 138 11/17/2017   K 4.7 11/17/2017   CL 109 11/17/2017   CO2 21 (L) 11/17/2017    06/2008 TEE Impressions:  - 1. EF improved to 45%.   2. 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   3. S/p mitral valve repair with no more than mild stenosis.  Anesthesia Physical Anesthesia Plan  ASA: IV  Anesthesia Plan: MAC   Post-op Pain Management:    Induction: Intravenous  PONV Risk Score and Plan: 2 and Ondansetron, Dexamethasone and Treatment may vary due to age or medical condition  Airway Management Planned: Natural Airway and Simple Face Mask  Additional Equipment: Arterial line, CVP and Ultrasound Guidance Line Placement  Intra-op Plan:    Post-operative Plan:   Informed Consent: I have reviewed the patients History and Physical, chart, labs and discussed the procedure including the risks, benefits and alternatives for the proposed anesthesia with the patient or authorized representative who has indicated his/her understanding and acceptance.     Plan Discussed with: CRNA  Anesthesia Plan Comments:        Anesthesia Quick Evaluation

## 2017-11-21 NOTE — Anesthesia Procedure Notes (Signed)
Arterial Line Insertion Start/End7/06/2017 10:05 AM, 11/21/2017 10:10 AM Performed by: Roberts Gaudy, MD, anesthesiologist  Preanesthetic checklist: patient identified, IV checked, site marked, risks and benefits discussed, surgical consent, monitors and equipment checked, pre-op evaluation and timeout performed Lidocaine 1% used for infiltration and patient sedated Right, brachial was placed Catheter size: 20 G Hand hygiene performed  and maximum sterile barriers used   Attempts: 1 Procedure performed using ultrasound guided technique. Following insertion, Biopatch and dressing applied. Patient tolerated the procedure well with no immediate complications.

## 2017-11-21 NOTE — Interval H&P Note (Signed)
History and Physical Interval Note:  11/21/2017 9:58 AM  [name]  has presented today for surgery, with the diagnosis of Severe Aortic Insufficiency  The various methods of treatment have been discussed with the patient and family. After consideration of risks, benefits and other options for treatment, the patient has consented to  Procedure(s): TRANSCATHETER AORTIC VALVE REPLACEMENT, TRANSFEMORAL (N/A) TRANSESOPHAGEAL ECHOCARDIOGRAM (TEE) (N/A) as a surgical intervention .  The patient's history has been reviewed, patient examined, no change in status, stable for surgery.  I have reviewed the patient's chart and labs.  Questions were answered to the patient's satisfaction.     Rexene Alberts

## 2017-11-21 NOTE — Anesthesia Postprocedure Evaluation (Signed)
Anesthesia Post Note  Patient: [employer] social research officer  Procedure(s) Performed: TRANSCATHETER AORTIC VALVE REPLACEMENT, TRANSFEMORAL using a 50m Edwards Sapien 3 Aortic Valve (N/A Chest) TRANSESOPHAGEAL ECHOCARDIOGRAM (TEE) (N/A )     Patient location during evaluation: PACU Anesthesia Type: MAC Level of consciousness: awake and alert Pain management: pain level controlled Vital Signs Assessment: post-procedure vital signs reviewed and stable Respiratory status: spontaneous breathing, nonlabored ventilation, respiratory function stable and patient connected to nasal cannula oxygen Cardiovascular status: stable and blood pressure returned to baseline Postop Assessment: no apparent nausea or vomiting Anesthetic complications: no    Last Vitals:  Vitals:   11/21/17 1335 11/21/17 1340  BP: (!) 81/54 (!) 76/51  Pulse: (!) 51 (!) 51  Resp: 16 17  Temp:    SpO2: 100% 100%    Last Pain:  Vitals:   11/21/17 1328  TempSrc: Temporal  PainSc:                  FTiajuana Amass

## 2017-11-21 NOTE — Progress Notes (Signed)
@  2205 during routine reassessment, Left Femoral Access Site observed to have new area of fresh blood on dressing (approximately 1 cm in circumference). Area soft, supple, painless, and otherwise WDL. Pt's VSS and pt denies any complaints or concerns. Will continue to closely monitor and have increased frequency of BP monitoring and site checks.

## 2017-11-22 ENCOUNTER — Inpatient Hospital Stay (HOSPITAL_COMMUNITY): Payer: Medicaid Other

## 2017-11-22 ENCOUNTER — Encounter (HOSPITAL_COMMUNITY): Payer: Self-pay | Admitting: Physician Assistant

## 2017-11-22 ENCOUNTER — Other Ambulatory Visit: Payer: Self-pay | Admitting: Physician Assistant

## 2017-11-22 DIAGNOSIS — I340 Nonrheumatic mitral (valve) insufficiency: Secondary | ICD-10-CM

## 2017-11-22 DIAGNOSIS — I5043 Acute on chronic combined systolic (congestive) and diastolic (congestive) heart failure: Secondary | ICD-10-CM

## 2017-11-22 DIAGNOSIS — Z952 Presence of prosthetic heart valve: Secondary | ICD-10-CM

## 2017-11-22 DIAGNOSIS — N189 Chronic kidney disease, unspecified: Secondary | ICD-10-CM | POA: Diagnosis present

## 2017-11-22 LAB — CBC
HEMATOCRIT: 33.8 % — AB (ref 36.0–46.0)
HEMOGLOBIN: 10.6 g/dL — AB (ref 12.0–15.0)
MCH: 28.6 pg (ref 26.0–34.0)
MCHC: 31.4 g/dL (ref 30.0–36.0)
MCV: 91.1 fL (ref 78.0–100.0)
Platelets: 136 10*3/uL — ABNORMAL LOW (ref 150–400)
RBC: 3.71 MIL/uL — AB (ref 3.87–5.11)
RDW: 17 % — ABNORMAL HIGH (ref 11.5–15.5)
WBC: 9.5 10*3/uL (ref 4.0–10.5)

## 2017-11-22 LAB — BASIC METABOLIC PANEL
Anion gap: 5 (ref 5–15)
BUN: 24 mg/dL — AB (ref 6–20)
CHLORIDE: 109 mmol/L (ref 98–111)
CO2: 22 mmol/L (ref 22–32)
CREATININE: 1.38 mg/dL — AB (ref 0.44–1.00)
Calcium: 8.1 mg/dL — ABNORMAL LOW (ref 8.9–10.3)
GFR calc Af Amer: 49 mL/min — ABNORMAL LOW (ref >60)
GFR calc non Af Amer: 42 mL/min — ABNORMAL LOW (ref >60)
Glucose, Bld: 104 mg/dL — ABNORMAL HIGH (ref 70–99)
POTASSIUM: 4.4 mmol/L (ref 3.5–5.1)
SODIUM: 136 mmol/L (ref 135–145)

## 2017-11-22 LAB — ECHOCARDIOGRAM COMPLETE
Height: 59 in
Weight: 3185.21 oz

## 2017-11-22 LAB — MAGNESIUM: MAGNESIUM: 2 mg/dL (ref 1.7–2.4)

## 2017-11-22 MED ORDER — SACUBITRIL-VALSARTAN 24-26 MG PO TABS
1.0000 | ORAL_TABLET | Freq: Two times a day (BID) | ORAL | Status: DC
  Administered 2017-11-22: 1 via ORAL
  Filled 2017-11-22 (×2): qty 1

## 2017-11-22 MED ORDER — FUROSEMIDE 40 MG PO TABS
40.0000 mg | ORAL_TABLET | Freq: Every day | ORAL | Status: DC
  Administered 2017-11-22 – 2017-11-23 (×2): 40 mg via ORAL
  Filled 2017-11-22 (×2): qty 1

## 2017-11-22 MED ORDER — SPIRONOLACTONE 25 MG PO TABS
25.0000 mg | ORAL_TABLET | Freq: Every day | ORAL | Status: DC
  Administered 2017-11-22 – 2017-11-23 (×2): 25 mg via ORAL
  Filled 2017-11-22 (×2): qty 1

## 2017-11-22 MED ORDER — PERFLUTREN LIPID MICROSPHERE
INTRAVENOUS | Status: AC
  Administered 2017-11-22: 2 mL via INTRAVENOUS
  Filled 2017-11-22: qty 10

## 2017-11-22 MED ORDER — CARVEDILOL 12.5 MG PO TABS
12.5000 mg | ORAL_TABLET | Freq: Two times a day (BID) | ORAL | Status: DC
  Filled 2017-11-22: qty 1

## 2017-11-22 MED ORDER — CARVEDILOL 12.5 MG PO TABS: 12.5000 mg | ORAL_TABLET | Freq: Two times a day (BID) | ORAL | Status: DC

## 2017-11-22 MED ORDER — POTASSIUM CHLORIDE ER 10 MEQ PO TBCR
20.0000 meq | EXTENDED_RELEASE_TABLET | Freq: Every day | ORAL | Status: DC
  Administered 2017-11-22 – 2017-11-23 (×2): 20 meq via ORAL
  Filled 2017-11-22 (×4): qty 2

## 2017-11-22 MED ORDER — PERFLUTREN LIPID MICROSPHERE
1.0000 mL | INTRAVENOUS | Status: AC | PRN
  Administered 2017-11-22: 2 mL via INTRAVENOUS
  Filled 2017-11-22: qty 10

## 2017-11-22 MED FILL — Potassium Chloride Inj 2 mEq/ML: INTRAVENOUS | Qty: 40 | Status: AC

## 2017-11-22 MED FILL — Phenylephrine HCl IV Soln 10 MG/ML: INTRAVENOUS | Qty: 2 | Status: AC

## 2017-11-22 MED FILL — Heparin Sodium (Porcine) Inj 1000 Unit/ML: INTRAMUSCULAR | Qty: 30 | Status: AC

## 2017-11-22 MED FILL — Sodium Chloride IV Soln 0.9%: INTRAVENOUS | Qty: 250 | Status: AC

## 2017-11-22 MED FILL — Magnesium Sulfate Inj 50%: INTRAMUSCULAR | Qty: 10 | Status: AC

## 2017-11-22 NOTE — Progress Notes (Addendum)
[facility]   MULTIDISCIPLINARY HEART VALVE TEAM  Patient Name: [name] Date of Encounter: 11/22/2017  Primary Cardiologist: Dr. Aundra Dubin / Dr. Burt Knack & Dr. Roxy Manns (TAVR)  Hospital Problem List     Principal Problem:   S/P TAVR (transcatheter aortic valve replacement) Active Problems:   Post-menopausal bleeding   Rheumatic heart valve disease   S/P aortic valve repair   S/P MVR (mitral valve repair)   Hyperlipidemia with target LDL less than 100   Hypothyroidism   Acute on chronic combined systolic and diastolic CHF (congestive heart failure) (HCC)   Morbid obesity (HCC)   Pulmonary hypertension (HCC)   Valvular cardiomyopathy (HCC)   Aortic valve stenosis and insufficiency, rheumatic   CKD (chronic kidney disease)     Subjective   Feeling good no complaints.  No chest pain or shortness of breath.  Inpatient Medications    Scheduled Meds: . aspirin  81 mg Oral Daily  . budesonide (PULMICORT) nebulizer solution  0.25 mg Nebulization BID  . carvedilol  12.5 mg Oral BID WC  . clopidogrel  75 mg Oral Q breakfast  . docusate sodium  100 mg Oral BID  . furosemide  40 mg Oral Daily  . levothyroxine  125 mcg Oral QAC breakfast  . potassium chloride  20 mEq Oral Daily  . rosuvastatin  20 mg Oral q1800  . sacubitril-valsartan  1 tablet Oral BID  . sodium chloride flush  3 mL Intravenous Q12H  . spironolactone  25 mg Oral Daily   Continuous Infusions: . sodium chloride 250 mL (11/22/17 0947)  . cefUROXime (ZINACEF)  IV 1.5 g (11/22/17 0950)  . nitroGLYCERIN    . phenylephrine (NEO-SYNEPHRINE) Adult infusion     PRN Meds: sodium chloride, acetaminophen **OR** acetaminophen, albuterol, diphenhydrAMINE, metoprolol tartrate, morphine injection, ondansetron (ZOFRAN) IV, oxyCODONE, perflutren lipid microspheres (DEFINITY) IV suspension, sodium chloride flush, traMADol   Vital Signs    Vitals:   11/21/17 2300 11/21/17 2359 11/22/17 0500 11/22/17 0505  BP:  98/68 96/76  120/61  Pulse: (!) 57 (!) 56  61  Resp: 17 (!) 25  (!) 24  Temp:  97.6 F (36.4 C)  97.6 F (36.4 C)  TempSrc:  Oral  Oral  SpO2: 100% 100%  100%  Weight:   199 lb 1.2 oz (90.3 kg)   Height:        Intake/Output Summary (Last 24 hours) at 11/22/2017 1000 Last data filed at 11/22/2017 0600 Gross per 24 hour  Intake 2373.07 ml  Output 1875 ml  Net 498.07 ml   Filed Weights   11/21/17 0842 11/22/17 0500  Weight: 196 lb (88.9 kg) 199 lb 1.2 oz (90.3 kg)    Physical Exam   GEN: Morbidly obese AA female. HEENT: Grossly normal.  Neck: Supple, no JVD, carotid bruits, or masses. Cardiac: RR, brady, no murmurs, rubs, or gallops. No clubbing, cyanosis, edema.  Radials/DP/PT 2+ and equal bilaterally.  Respiratory:  Respirations regular and unlabored, clear to auscultation bilaterally. GI: Soft, nontender, nondistended, BS + x 4. MS: no deformity or atrophy. Skin: warm and dry, no rash. Groin site soft with no hematoma Neuro:  Strength and sensation are intact. Psych: AAOx3.  Normal affect.  Labs    CBC Recent Labs    11/21/17 1306 11/22/17 0515  WBC  --  9.5  HGB 11.6* 10.6*  HCT 34.0* 33.8*  MCV  --  91.1  PLT  --  024*   Basic Metabolic Panel Recent Labs  11/21/17 1306 11/22/17 0515  NA 142 136  K 4.1 4.4  CL 106 109  CO2  --  22  GLUCOSE 126* 104*  BUN 24* 24*  CREATININE 1.30* 1.38*  CALCIUM  --  8.1*  MG  --  2.0   Liver Function Tests No results for input(s): AST, ALT, ALKPHOS, BILITOT, PROT, ALBUMIN in the last 72 hours. No results for input(s): LIPASE, AMYLASE in the last 72 hours. Cardiac Enzymes No results for input(s): CKTOTAL, CKMB, CKMBINDEX, TROPONINI in the last 72 hours. BNP Invalid input(s): POCBNP D-Dimer No results for input(s): DDIMER in the last 72 hours. Hemoglobin A1C No results for input(s): HGBA1C in the last 72 hours. Fasting Lipid Panel No results for input(s): CHOL, HDL, LDLCALC, TRIG, CHOLHDL, LDLDIRECT in the  last 72 hours. Thyroid Function Tests No results for input(s): TSH, T4TOTAL, T3FREE, THYROIDAB in the last 72 hours.  Invalid input(s): FREET3  Telemetry    Sinus with PACs and PVCs - Personally Reviewed  ECG    Sinus with 1st deg AV block - Personally Reviewed  Radiology    Dg Chest Port 1 View  Result Date: 11/21/2017 CLINICAL DATA:  Status post transcatheter aortic valve replacement EXAM: PORTABLE CHEST 1 VIEW COMPARISON:  November 17, 2017 FINDINGS: There is a prosthetic aortic valve. Patient is status post median sternotomy. Heart is mildly enlarged with pulmonary vascularity within normal limits. There is slight bibasilar atelectasis. Lungs elsewhere are clear. No adenopathy. There are surgical clips at the cervical-thoracic junction. Central catheter tip is in the superior vena cava. No pneumothorax. No evident bone lesions. IMPRESSION: Stable cardiac prominence with prosthetic aortic valve present. Slight bibasilar atelectasis. No edema or consolidation. Central catheter tip in superior vena cava. No evident pneumothorax. Electronically Signed   By: Lowella Grip III M.D.   On: 11/21/2017 16:50    Cardiac Studies   TAVR OPERATIVE NOTE   Date of Procedure:                11/21/2017  Preoperative Diagnosis:      Severe aortic insufficiency and aortic stenosis  Procedure:        Transcatheter Aortic Valve Replacement - Percutaneous Right Transfemoral Approach             Edwards Sapien 3 THV (size 23 mm, model # 9600TFX, serial # [id number])              Co-Surgeons:                        Valentina Gu. Roxy Manns, MD and Sherren Mocha, MD  Pre-operative Echo Findings: ? Severe aortic insufficiency and aortic stenosis ? Normal left ventricular systolic function  Post-operative Echo Findings: ? No paravalvular leak ? Normal left ventricular systolic function   ________________   Post operative echo 11/22/17: pending   Patient Profile     [name] is a 57  y.o. 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  Assessment & Plan   Severe bioprosthetic AI with moderate AS: s/p successful TAVR with a 23 mm Edwards Sapien 3 THV via the TF approach on 11/21/17. Post operative echo pending. Groin sites are stable. ECG with NSR and no high grade heart block. Continue ASA and plavix. Discontinue central line.  Early mobilization and hopeful discharge home tomorrow.   Acute on chronic combined S/D CHF: as evidenced by an elevated BNP on preadmission labs. This has been treated with TAVR. I have resumed her home lasix and CHF regimen including lasix, entresto, coreg and spiro.   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 Will resume home Coreg and continue to monitor.  CKD: creat bumped last week with over diuresis. Now back to baseline. Continue to monitor adding back home CHF meds.   SignedAngelena Form, PA-C  11/22/2017, 10:00 AM  Pager [phone]  Patient seen, examined. Available data reviewed. Agree with findings, assessment, and plan as outlined by Nell Range, PA-C. Exam: alert and oriented, in NAD. Lungs CTA, heart RRR with 2/6 systolic murmur at the RUSB, lungs CTA, abdomen soft, NT, BL groin sites clear, no edema. Echo findings reviewed: Study Conclusions  - Left ventricle: The cavity size was normal. There was moderate   concentric hypertrophy. Systolic function was mildly to   moderately reduced. The estimated ejection fraction was in the   range of 40% to 45%. Diffuse hypokinesis. Features are consistent   with a pseudonormal left ventricular filling pattern, with   concomitant abnormal relaxation and increased filling pressure   (grade 2 diastolic  dysfunction). Doppler parameters are   consistent with elevated ventricular end-diastolic filling   pressure. - Aortic valve: S/P TAVR with a 23 mm Edwards-SAPIEN 3 valve. There   was mild stenosis. There was trivial regurgitation. Peak gradient   (S): 38 mm Hg. Valve area (VTI): 0.73 cm^2. Valve area (Vmax):   0.58 cm^2. Mean velocity ratio of LVOT to aortic valve: 0.22.   Valve area (Vmean): 0.63 cm^2. - Mitral valve: S/P mitral valve repair. The findings are   consistent with moderate stenosis. There was mild regurgitation.   Valve area by continuity equation (using LVOT flow): 0.71 cm^2. - Left atrium: The atrium was mildly dilated.  Impressions:  - S/P TAVR with a 23 mm Edwards-SAPIEN 3 valve. Transaortic   gradients are mildly elevated with peak/mean 38/21 mmHg. There is   trivial paravalvular leak.   S/P previous mitral valve repair with moderately elevated   gradients (mean 9 mmHg).  Elevated aortic valve gradient likely related to patient prosthesis mismatch as she has a small aortic annulus. Overall doing well with chronic hypotension noted. Will hold antihypertensives tonight and reassess in am, likely with reduction in dosing of BP medications.   Sherren Mocha, M.D. 11/22/2017 6:48 PM

## 2017-11-22 NOTE — Progress Notes (Addendum)
Patient with BP 06-81 systolic this evening and 12.5 mg coreg ordered. Melina Copa PAC on call for cardiology paged through [facility] system to make aware will await call back/ orders will monitor patient Narciso Stoutenburg, Bettina Gavia RN

## 2017-11-22 NOTE — Progress Notes (Addendum)
Called by nursing for hypotension in the 84O-96E systolic. Asymptomatic. Persistent hypotension noted this admission. Received Entresto, spiro, Lasix today. Has Entresto and Coreg 12.5mg  ordered for this evening. Pressures were down to the 80s earlier (they were as low as 70s yesterday requiring neo). Will d/c Entresto and Coreg for tonight. Team should reassess in AM. Wrote care order requiring nursing staff review AM meds with structural team tomorrow before administering further antihypertensives, as doses likely need adjusting. Continue to monitor closely. Denine Brotz PA-C

## 2017-11-22 NOTE — Progress Notes (Signed)
CARDIAC REHAB PHASE I   PRE:  Rate/Rhythm: 68 SR bigeminy PVCs  BP:  Supine: 104/70  Sitting:   Standing:    SaO2: 99%RA  MODE:  Ambulation: 200 ft   POST:  Rate/Rhythm: 71 SR PVCs  BP:  Supine: 125/77  Sitting:   Standing:    SaO2: 100%RA 5449-2010  [id number] Came to see pt and began ed. Discussed CRP 2 and pt agreed to referral to [facility]. Gave pt low sodium handouts but also since A1C at 6.8 , I also gave a carb counting diet. Pt stated no one had ever mentioned that her sugars were up. Encouraged her to follow up with Medical doctor. Notified Dr Burt Knack when on floor  also of A1C.  Encouraged walking as tolerated at home. IV team in to try to restart so I left and then returned later to walk. Pt walked 200 ft but then stated right thigh was cramping so bad she could not make it back. 10 on pain scale. Pt tried to rest it standing without relief. Rolled pt back to room. Right thigh was soft. Notified RN. Pt in bed with relief once leg resting. VSS.   Graylon Good, RN BSN  11/22/2017 11:04 AM

## 2017-11-22 NOTE — Progress Notes (Signed)
  Echocardiogram 2D Echocardiogram has been performed.  [name] F 11/22/2017, 10:12 AM

## 2017-11-22 NOTE — Discharge Summary (Signed)
[place] VALVE TEAM   Discharge Summary    Patient ID: [name],  MRN: [id number], DOB/AGE: 12-19-1960 57 y.o.  Admit date: 11/21/2017 Discharge date: 11/23/2017  Primary Care Provider: Aura Dials Primary Cardiologist: Dr. Aundra Dubin / Dr. Burt Knack & Dr. Roxy Manns (TAVR)   Discharge Diagnoses    Principal Problem:   S/P TAVR (transcatheter aortic valve replacement) Active Problems:   Post-menopausal bleeding   Rheumatic heart valve disease   S/P aortic valve repair   S/P MVR (mitral valve repair)   Hyperlipidemia with target LDL less than 100   Hypothyroidism   Acute on chronic combined systolic and diastolic CHF (congestive heart failure) (HCC)   Morbid obesity ([place])   Pulmonary hypertension (HCC)   Valvular cardiomyopathy (HCC)   Aortic valve stenosis and insufficiency, rheumatic   CKD (chronic kidney disease)   Allergies Allergies  Allergen Reactions  . Morphine And Related Other (See Comments)    Hallucinations      History of Present Illness     [name] is a 57 y.o. 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  Patient has long-standing rheumatic valvular heart disease with chronic combined systolic and diastolic congestive heart failure status post aortic valve repair and mitral valve repair in 2010.She developedmoderate aortic stenosis withsevere aortic insufficiencyfor which shepresented more than 1 year ago with anacute exacerbation ofCHF.At that time she had severely depressed left ventricular systolic function and severe mitral regurgitation. Remarkably, shehas been doing much better for the past year on medical therapy with close follow-up through the advanced heart failure  clinic. Follow-up echocardiograms have documented continued gradual improvement in left ventricular systolic function and resolution of mitral regurgitation. Recentlythe patient has remainedquite stable on her current medical regimen, with symptoms of exertional shortness of breath occurring only with more strenuous physical activity consistent with chronic combined systolic and diastolic congestive heart failure, New York Heart Association functional class II.  The patient'srecent follow-uptransthoracic andtransesophageal echocardiogramshave been reviewed with a multidisciplinary team of specialists. TEEdemonstrates some further improvement in global left ventricular systolic function but mild to moderate aortic stenosis withsevere aortic insufficiency.Peak velocity across aortic valve measured greater than or equal to 3 m/s, although flows across the valve are probably elevated due to the underlying severe aortic insufficiency. Her mitral valve repair appears intact and there is only trivial residual mitral regurgitation.Mean transvalvular gradient across the mitral valve estimated only 5 mmHg. Wedo not feel that redo mitral valve repair or replacement is indicated.   After evaluation by the multidisciplinary valve team she was deemed to be a suitable candidate for TAVR, which was set up for 11/21/17.  Hospital Course     Consultants: none  Severe bioprosthetic AI with moderate AS: s/p successful TAVR with a 23 mm Edwards Sapien 3 THV via the TF approach on 11/21/17. Post operative echo showed normal LV function with mildly elevated transaortic valve gradients and no significant paravalvular regurgitation.  Groin sites are stable. ECG with NSR and no high grade heart block. Continue ASA and plavix. She will be seen back in valve clinic for 1 week TOC.  Acute on chronic combined S/D CHF: as evidenced by an elevated BNP on preadmission labs. This has been treated with TAVR. I have  resumed her home  lasix and CHF regimen with changes as outlined.   Hypothyroidism: continue synthroid  Sinus brady: Because of hypotension her carvedilol dose is reduced to 6.25 mg twice daily  CKD: creat has remained stable.  Check BMET next week at follow up.   Hypotension: Acute on chronic.  Home medication list is reviewed.  We will continue her diuretic regimen with furosemide and Spironolactone.  Her carvedilol will be reduced to 6.25 mg twice daily.  Will hold Entresto until evaluated as an outpatient in order to prevent further episodes of hypotension.  Clearly a chronic component is present as she has been asymptomatic even with systolic blood pressures around 80 mmHg.   The patient has had an uncomplicated hospital course and is recovering well. The femoral catheter sites are stable. She has been seen by Dr. Burt Knack today and deemed ready for discharge home. All follow-up appointments have been scheduled. Discharge medications are listed below.  _____________  Discharge Vitals Blood pressure 100/62, pulse 64, temperature 97.7 F (36.5 C), temperature source Oral, resp. rate 14, height 4\' 11"  (1.499 m), weight 196 lb 6.4 oz (89.1 kg), last menstrual period 07/07/2016, SpO2 99 %.  Filed Weights   11/21/17 0842 11/22/17 0500 11/23/17 0530  Weight: 196 lb (88.9 kg) 199 lb 1.2 oz (90.3 kg) 196 lb 6.4 oz (89.1 kg)    Labs & Radiologic Studies     CBC Recent Labs    11/22/17 0515 11/23/17 0445  WBC 9.5 8.2  HGB 10.6* 10.6*  HCT 33.8* 33.6*  MCV 91.1 90.3  PLT 136* 604*   Basic Metabolic Panel Recent Labs    11/22/17 0515 11/23/17 0445  NA 136 142  K 4.4 4.0  CL 109 109  CO2 22 25  GLUCOSE 104* 90  BUN 24* 23*  CREATININE 1.38* 1.31*  CALCIUM 8.1* 8.8*  MG 2.0  --    Liver Function Tests No results for input(s): AST, ALT, ALKPHOS, BILITOT, PROT, ALBUMIN in the last 72 hours. No results for input(s): LIPASE, AMYLASE in the last 72 hours. Cardiac Enzymes No  results for input(s): CKTOTAL, CKMB, CKMBINDEX, TROPONINI in the last 72 hours. BNP Invalid input(s): POCBNP D-Dimer No results for input(s): DDIMER in the last 72 hours. Hemoglobin A1C No results for input(s): HGBA1C in the last 72 hours. Fasting Lipid Panel No results for input(s): CHOL, HDL, LDLCALC, TRIG, CHOLHDL, LDLDIRECT in the last 72 hours. Thyroid Function Tests No results for input(s): TSH, T4TOTAL, T3FREE, THYROIDAB in the last 72 hours.  Invalid input(s): FREET3  Dg Chest 2 View  Result Date: 11/20/2017 CLINICAL DATA:  Preoperative examination for cardiac valve surgery. History of previous open-heart surgery, CHF, aortic and mitral valve repair. EXAM: CHEST - 2 VIEW COMPARISON:  Chest x-ray of July 05, 2016 FINDINGS: The lungs are adequately inflated and clear. The cardiac silhouette is enlarged. Prosthetic valve rings are present. The pulmonary vascularity is normal. There is no pleural effusion. The sternal wires are intact. The bony thorax exhibits no acute abnormality. IMPRESSION: Stable cardiomegaly. No pulmonary edema nor other acute cardiopulmonary abnormality. Electronically Signed   By: David  Martinique M.D.   On: 11/20/2017 07:15   Dg Chest Port 1 View  Result Date: 11/21/2017 CLINICAL DATA:  Status post transcatheter aortic valve replacement EXAM: PORTABLE CHEST 1 VIEW COMPARISON:  November 17, 2017 FINDINGS: There is a prosthetic aortic valve. Patient is status post median sternotomy. Heart is mildly enlarged with pulmonary vascularity within normal limits. There is slight bibasilar atelectasis. Lungs elsewhere  are clear. No adenopathy. There are surgical clips at the cervical-thoracic junction. Central catheter tip is in the superior vena cava. No pneumothorax. No evident bone lesions. IMPRESSION: Stable cardiac prominence with prosthetic aortic valve present. Slight bibasilar atelectasis. No edema or consolidation. Central catheter tip in superior vena cava. No evident  pneumothorax. Electronically Signed   By: Lowella Grip III M.D.   On: 11/21/2017 16:50     Diagnostic Studies/Procedures     TAVR OPERATIVE NOTE   Date of Procedure:11/21/2017  Preoperative Diagnosis:Severeaorticinsufficiency and aortic stenosis  Procedure:   Transcatheter Aortic Valve Replacement - PercutaneousRightTransfemoral Approach Edwards Sapien 3 THV (size 63mm, model # 9600TFX, serial # [id number])  Co-Surgeons:Clarence H. Roxy Manns, MD and Sherren Mocha, MD  Pre-operative Echo Findings: ? Severe aorticinsufficiency and aortic stenosis ? Normalleft ventricular systolic function  Post-operative Echo Findings: ? Noparavalvular leak ? Normalleft ventricular systolic function   ________________   Post operative echo 11/22/17:  Study Conclusions  - Left ventricle: The cavity size was normal. There was moderate   concentric hypertrophy. Systolic function was mildly to   moderately reduced. The estimated ejection fraction was in the   range of 40% to 45%. Diffuse hypokinesis. Features are consistent   with a pseudonormal left ventricular filling pattern, with   concomitant abnormal relaxation and increased filling pressure   (grade 2 diastolic dysfunction). Doppler parameters are   consistent with elevated ventricular end-diastolic filling   pressure. - Aortic valve: S/P TAVR with a 23 mm Edwards-SAPIEN 3 valve. There   was mild stenosis. There was trivial regurgitation. Peak gradient   (S): 38 mm Hg. Valve area (VTI): 0.73 cm^2. Valve area (Vmax):   0.58 cm^2. Mean velocity ratio of LVOT to aortic valve: 0.22.   Valve area (Vmean): 0.63 cm^2. - Mitral valve: S/P mitral valve repair. The findings are   consistent with moderate stenosis. There was mild regurgitation.   Valve area by continuity equation (using LVOT flow): 0.71 cm^2. - Left atrium: The atrium was mildly  dilated.  Impressions:  - S/P TAVR with a 23 mm Edwards-SAPIEN 3 valve. Transaortic   gradients are mildly elevated with peak/mean 38/21 mmHg. There is   trivial paravalvular leak.   S/P previous mitral valve repair with moderately elevated   gradients (mean 9 mmHg).  Disposition   Pt is being discharged home today in good condition.  Follow-up Plans & Appointments    Follow-up Information    Eileen Stanford, PA-C. Go on 11/29/2017.   Specialties:  Cardiology, Radiology Why:  @ 2:30pm Contact information: [place] [address] [phone]          Discharge Instructions    Amb Referral to Cardiac Rehabilitation   Complete by:  As directed    Diagnosis:  Valve Replacement   Valve:  Aortic Comment - TAVR   Diet - low sodium heart healthy   Complete by:  As directed    Discharge instructions   Complete by:  As directed    ACTIVITY AND EXERCISE . Daily activity and exercise are an important part of your recovery. People recover at different rates depending on their general health and type of valve procedure. . Most people require six to 10 weeks to feel recovered. . No lifting, pushing, pulling more than 10 pounds (examples to avoid: groceries, vacuuming, gardening, golfing):  - For one week with a procedure through the groin.  - For six weeks for procedures through the chest wall.  -  For three months for procedures through the breast-bone. NOTE: You will typically see one of our providers 7-10 days after your procedure to discuss [place] the above activities.    DRIVING . Do not drive for four weeks after the date of your procedure. . If you have been told by your doctor in the past that you may not drive, you must talk with him/her before you begin driving again. . When you resume driving, you must have someone with you.   DRESSING . Groin site: you may leave the clear dressing over the site for up to one week or until  it falls off.   HYGIENE . If you had a femoral (leg) procedure, you may take a shower when you return home. After the shower, pat the site dry. Do NOT use powder, oils or lotions in your groin area until the site has completely healed. . If you had a chest procedure, you may shower when you return home unless specifically instructed not to by your discharging practitioner.  - DO NOT scrub incision; pat dry with a towel  - DO NOT apply any lotions, oils, powders to the incision  - No tub baths / swimming for at least six weeks. . If you notice any fevers, chills, increased pain, swelling, bleeding or pus, please contact your doctor.  ADDITIONAL INFORMATION . If you are going to have an upcoming dental procedure, please contact our office as you may require antibiotics ahead of time to prevent infection on your heart valve.   Increase activity slowly   Complete by:  As directed       Discharge Medications     Medication List    STOP taking these medications   sacubitril-valsartan 24-26 MG Commonly known as:  ENTRESTO     TAKE these medications   acetaminophen 325 MG tablet Commonly known as:  TYLENOL Take 650 mg every 6 (six) hours as needed by mouth for mild pain.   albuterol 108 (90 Base) MCG/ACT inhaler Commonly known as:  PROVENTIL HFA;VENTOLIN HFA Inhale 2 puffs into the lungs every 6 (six) hours as needed for shortness of breath.   aspirin 81 MG chewable tablet Chew 1 tablet (81 mg total) by mouth daily.   beclomethasone 40 MCG/ACT inhaler Commonly known as:  QVAR Inhale 2 puffs into the lungs daily as needed (shortness of breath).   carvedilol 12.5 MG tablet Commonly known as:  COREG Take 0.5 tablets (6.25 mg total) by mouth 2 (two) times daily with a meal. What changed:  how much to take   clopidogrel 75 MG tablet Commonly known as:  PLAVIX Take 1 tablet (75 mg total) by mouth daily with breakfast. Start taking on:  11/24/2017   docusate sodium 100 MG  capsule Commonly known as:  COLACE Take 1 capsule (100 mg total) by mouth 2 (two) times daily.   furosemide 40 MG tablet Commonly known as:  LASIX Take 1 tablet (40 mg total) by mouth daily.   levothyroxine 125 MCG tablet Commonly known as:  SYNTHROID, LEVOTHROID Take 125 mcg by mouth daily before breakfast.   potassium chloride 10 MEQ tablet Commonly known as:  K-DUR Take 2 tablets (20 mEq total) by mouth daily.   rosuvastatin 20 MG tablet Commonly known as:  CRESTOR Take 1 tablet (20 mg total) by mouth daily.   spironolactone 25 MG tablet Commonly known as:  ALDACTONE Take 1 tablet (25 mg total) by mouth daily.      Duration  of Discharge Encounter   Greater than 30 minutes including physician time.  Signed, Reino Bellis PA-C 11/23/2017, 11:07 AM  Patient seen, examined. Available data reviewed. Agree with findings, assessment, and plan as outlined by Reino Bellis NP-C. Exam today: Vitals:   11/23/17 0728 11/23/17 0743  BP: 100/62   Pulse: 64   Resp: 14   Temp: 97.7 F (36.5 C)   SpO2: 100% 99%   Pt is alert and oriented, pleasant obese woman in NAD HEENT: normal Neck: JVP - normal Lungs: CTA bilaterally CV: RRR with 2/6 systolic murmur at the RUSB and LLSB Abd: soft, NT, Positive BS, no hepatomegaly Ext: no C/C/E, distal pulses intact and equal, BL groin sites clear Skin: warm/dry no rash  Changes made above where appropriate.  The patient is doing very well and ambulating in the hallway without symptoms.  Her antihypertensive/heart failure regimen is adjusted to prevent further hypotension.  Entresto was placed on hold and carvedilol doses reduced by 50%.  She will continue on furosemide and spironolactone at current doses on discharge.  Follow-up as arranged as above.  She has a mildly elevated transvalvular gradients which I suspect are related to patient prosthesis mismatch in this patient with a small aortic prosthesis.  Sherren Mocha,  M.D. 11/23/2017 11:07 AM

## 2017-11-22 NOTE — Discharge Instructions (Signed)

## 2017-11-22 NOTE — Progress Notes (Signed)
Pt noticed a golf ball sized spot on gown - L groin site with dried blood where bandaid had been applied. No active bleeding at this time. Site cleaned. Gauze and tegaderm applied. Site level 1 with some bruising, but soft. Nell Range PA made aware.   Fritz Pickerel, RN

## 2017-11-22 NOTE — Progress Notes (Signed)
PA on call made aware of patient blood pressure and orders received will monitor patient. Nelda Bucks, Bettina Gavia RN

## 2017-11-22 NOTE — Plan of Care (Signed)
  Problem: Health Behavior/Discharge Planning: Goal: Ability to manage health-related needs will improve Outcome: Progressing   Problem: Clinical Measurements: Goal: Ability to maintain clinical measurements within normal limits will improve Outcome: Progressing Goal: Will remain free from infection Outcome: Progressing   

## 2017-11-23 DIAGNOSIS — I351 Nonrheumatic aortic (valve) insufficiency: Secondary | ICD-10-CM

## 2017-11-23 LAB — BASIC METABOLIC PANEL
ANION GAP: 8 (ref 5–15)
BUN: 23 mg/dL — ABNORMAL HIGH (ref 6–20)
CALCIUM: 8.8 mg/dL — AB (ref 8.9–10.3)
CO2: 25 mmol/L (ref 22–32)
Chloride: 109 mmol/L (ref 98–111)
Creatinine, Ser: 1.31 mg/dL — ABNORMAL HIGH (ref 0.44–1.00)
GFR, EST AFRICAN AMERICAN: 52 mL/min — AB (ref >60)
GFR, EST NON AFRICAN AMERICAN: 45 mL/min — AB (ref >60)
Glucose, Bld: 90 mg/dL (ref 70–99)
POTASSIUM: 4 mmol/L (ref 3.5–5.1)
Sodium: 142 mmol/L (ref 135–145)

## 2017-11-23 LAB — CBC
HCT: 33.6 % — ABNORMAL LOW (ref 36.0–46.0)
Hemoglobin: 10.6 g/dL — ABNORMAL LOW (ref 12.0–15.0)
MCH: 28.5 pg (ref 26.0–34.0)
MCHC: 31.5 g/dL (ref 30.0–36.0)
MCV: 90.3 fL (ref 78.0–100.0)
PLATELETS: 122 10*3/uL — AB (ref 150–400)
RBC: 3.72 MIL/uL — AB (ref 3.87–5.11)
RDW: 17.2 % — AB (ref 11.5–15.5)
WBC: 8.2 10*3/uL (ref 4.0–10.5)

## 2017-11-23 MED ORDER — CARVEDILOL 12.5 MG PO TABS: 6.2500 mg | ORAL_TABLET | Freq: Two times a day (BID) | ORAL | 2 refills | Status: DC

## 2017-11-23 MED ORDER — CLOPIDOGREL BISULFATE 75 MG PO TABS: 75.0000 mg | ORAL_TABLET | Freq: Every day | ORAL | 1 refills | Status: DC

## 2017-11-23 NOTE — Care Management Note (Signed)
Case Management Note Marvetta Gibbons RN, BSN Unit 4E-Case Manager [phone]  Patient Details  Name: [name] MRN: [id number] Date of Birth: 1961/05/19  Subjective/Objective:   Pt admitted s/p TAVR                 Action/Plan: PTA pt lived at home, independent, no CM needs noted for transition to home.   Expected Discharge Date:  11/23/17               Expected Discharge Plan:  Home/Self Care  In-House Referral:  NA  Discharge planning Services  CM Consult  Post Acute Care Choice:  NA Choice offered to:  NA  DME Arranged:    DME Agency:     HH Arranged:    HH Agency:     Status of Service:  Completed, signed off  If discussed at [employer] of Stay Meetings, dates discussed:    Additional Comments:  Dawayne Patricia, RN 11/23/2017, 11:27 AM

## 2017-11-23 NOTE — Progress Notes (Signed)
Patient in a stable condition, discharged education reviewed with patient she verbalized understanding, iv removed, tele dc ccmd notified, patient belongings at bedside, patient ti be transported home by her friend

## 2017-11-24 ENCOUNTER — Telehealth: Payer: Self-pay | Admitting: Physician Assistant

## 2017-11-24 NOTE — Telephone Encounter (Signed)
  [place] VALVE TEAM   Patient contacted regarding discharge from [facility] on 11/23/17.  Patient understands to follow up with provider Nell Range on 11/29/17 @ 2:30pm at [place].  Patient understands discharge instructions? yes Patient understands medications and regiment? yes Patient understands to bring all medications to this visit? yes  Angelena Form PA-C  [facility]

## 2017-11-27 ENCOUNTER — Telehealth (HOSPITAL_COMMUNITY): Payer: Self-pay

## 2017-11-27 ENCOUNTER — Encounter (HOSPITAL_COMMUNITY): Payer: Self-pay | Admitting: Cardiovascular Disease

## 2017-11-27 NOTE — Telephone Encounter (Signed)
Called patient to see if she is interested in the Cardiac Rehab Program - Patient stated she is interested. Explained scheduling process and went over insurance, patient verbalized understanding. Will contact patient for scheduling upon review by the RN Navigator once follow up appt has been completed.

## 2017-11-27 NOTE — Telephone Encounter (Signed)
Patients insurance is active and benefits verified through [facility] - Reference [phone]  Will fax over [facility] Reimbursement form to [place].  Will contact patient to see if she is interested in the Cardiac Rehab Program. If interested, patient will need to complete follow up appt. Once completed, patient will be contacted for scheduling upon review by the RN Navigator.

## 2017-11-28 NOTE — Progress Notes (Addendum)
HE[facility] [place]                                       Cardiology Office Note    Date:  11/29/2017   ID:  [name], DOB 10/26/1960, MRN [id number]  PCP:  Aura Dials, PA-C  Cardiologist: Dr. Aundra Dubin / Dr. Burt Knack & Dr. Roxy Manns (TAVR)  CC: TOC s/p TAVR   History of Present Illness:  [name] is a 57 y.o. 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   Patient has long-standing rheumatic valvular heart disease with chronic combined systolic and diastolic congestive heart failure status post aortic valve repair and mitral valve repair in 2010.She developedmoderate aortic stenosis withsevere aortic insufficiencyfor which shepresented more than 1 year ago with anacute exacerbation ofCHF.At that time she had severely depressed left ventricular systolic function and severe mitral regurgitation. Remarkably, shehas been doing much better for the past year on medical therapy with close follow-up through the advanced heart failure clinic. Follow-up echocardiograms have documented continued gradual improvement in left ventricular systolic function and resolution of mitral regurgitation. Recentlythe patient has remainedquite stable on her current medical regimen, with symptoms of exertional shortness of breath occurring only with more strenuous physical activityconsistent with chronic combined systolic and diastolic congestive heart failure, New York Heart Association functional class II.  RecentTEEdemonstrated further improvement in global left ventricular systolic function but mild to moderate aortic stenosis withsevere aortic insufficiency.After evaluation by the multidisciplinary valve team she was  deemed to be a suitable candidate for TAVR.  She underwent successful TAVR with a69mm Edwards Sapien 3 THV via the TF approach on 11/21/17. Post operative echo showed normal LV function with mildly elevated transaortic valve gradients and no significant paravalvular regurgitation. She was discharged on ASA and plavix. Because of hypotension her Coreg was reduced and Entresto 24-26mg  held.  Today she presents to clinic for follow up. No CP or SOB. No LE edema, orthopnea or PND. No dizziness or syncope. No blood in stool or urine. No palpitations. She gets pain in the right anterior thigh with walking that is relieved with rest. Feels like a cramping and 91/0 at its worst. It occurs pretty much everytime she ambulates.    Past Medical History:  Diagnosis Date  . Chronic combined systolic and diastolic CHF (congestive heart failure) (HCC)    EF now down to 25-30%. LVEDP on cath was 30 mmHg with wedge pressure of 31 mmHg.  . CKD (chronic kidney disease)   . Dementia    Early onset  . Essential hypertension 12/16/2013  . History of GI bleeding   . Hyperlipidemia with target LDL less than 100 12/16/2013  . Hypothyroidism 12/16/2013  . 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 Morbid obesity ([place])   . 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 Papillary thyroid carcinoma ([place])    [name] 05/10/2016 from [place]  . Post-menopausal bleeding 12/05/2011  . Pulmonary hypertension ([place]) 05/2016   By cardiac catheterization: mean RA 18, RV pressure/EDP: 70/12/18 mmHg.  mean PA 74/39 mean 52, mean PCWP 31 (with V wave of 45 mmHg), LVEDP ~  30 mmHg.  [name] RAD (reactive airway disease)   . 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 Roxy Manns  Echo 12/2010: EF 40-45%, inferior hypokinesis; Slow progression of valvular Dz & drop in EF --> 05/2016: EF 25-30% (down from 40-45% post-op) with Mod  AS/AI, mild MS & Severe MR (Cath & TEE 06/2016)   . S/P aortic valve repair 34/19/3790   suture plication of 3 commissures  . S/P MVR (mitral valve repair) 12/23/2008   26 mm Sorin MEMO 3D Ring Annuloplasty  . 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 Progressive worsening of aortic and mitral valve disease.    Past Surgical History:  Procedure Laterality Date  . AORTIC VALVE REPAIR  August 2409   Suture plication of all 3 commissures  . CARDIAC CATHETERIZATION  August 2010   Preop: nonobstructive coronary disease  . [place]; 1996  . MITRAL VALVE REPAIR  August 2010    26 mm Sorin [facility] 3D Ring Annuloplasty  . RIGHT HEART CATH N/A 08/30/2016   Procedure: Right Heart Cath;  Surgeon: Larey Dresser, MD;  Location: [facility];  Service: Cardiovascular;  Laterality: N/A;  . RIGHT/LEFT HEART CATH AND CORONARY ANGIOGRAPHY N/A 07/08/2016   Procedure: Right/Left Heart Cath and Coronary Angiography;  Surgeon: Leonie Man, MD;  Location: [facility];  Service: Cardiovascular: Normal coronaries, mean RA 18, RV pressure/EDP: 70/12/18 mmHg.  mean PA 74/39 mean 52, mean PCWP 31 (with V wave of 45 mmHg), LVEDP ~ 30 mmHg.  CI 1.98 Fick. Peak AoV gradient ~15 mHg    . TEE WITHOUT CARDIOVERSION N/A 07/13/2016   Procedure: Transesophageal Echocardiogram (TEE);  Surgeon: [name], MD;  Location: [facility] ENDOSCOPY;  Service: Cardiovascular:  EF 25-30%, dilated LV - diffuse hypokinesis, rheumatic-appearing Aortic Vallve s/p repair with moderate AS (AVA 1.1 cm^2), moderate central AI.  MV s/p repeat with mild mitral stenosis and eccentric severe MR.    . TEE WITHOUT CARDIOVERSION    . TEE WITHOUT CARDIOVERSION N/A 06/28/2017   Procedure: TRANSESOPHAGEAL  ECHOCARDIOGRAM (TEE);  Surgeon: Larey Dresser, MD;  Location: [facility] ENDOSCOPY;  Service: Cardiovascular;  Laterality: N/A;  . TEE WITHOUT CARDIOVERSION N/A 11/21/2017   Procedure: TRANSESOPHAGEAL ECHOCARDIOGRAM (TEE);  Surgeon: Sherren Mocha, MD;  Location: [facility];  Service: Open Heart Surgery;  Laterality: N/A;  . THYROID LOBECTOMY Left 09/17/2009   which showed a 3.5 cm follicular variant papillary cancer  . THYROIDECTOMY     "2nd OR on my thyroid they took out the whole thing"  . TRANSCATHETER AORTIC VALVE REPLACEMENT, TRANSFEMORAL  11/21/2017   Transcatheter Aortic Valve Replacement - Percutaneous Right Transfemoral Approach  . TRANSCATHETER AORTIC VALVE REPLACEMENT, TRANSFEMORAL N/A 11/21/2017   Procedure: TRANSCATHETER AORTIC VALVE REPLACEMENT, TRANSFEMORAL using a 10mm Edwards Sapien 3 Aortic Valve;  Surgeon: Sherren Mocha, MD;  Location: [facility];  Service: Open Heart Surgery;  Laterality: N/A;  . TRANSTHORACIC ECHOCARDIOGRAM  05/2016   EF 25-30%. Moderate aortic stenosis with moderate regurgitation. Also potential aortic stenosis with likely worse than expected regurgitation  . TRANSTHORACIC ECHOCARDIOGRAM  May 2012    EF 40-45%, inferior hypokinesis. Slightly increased transaortic velocity = mild AS with mild to  moderate AI normal MV gradients with no MR. Significantly improved PA pressures. Paradoxical septal motion.    Current Medications: Outpatient Medications Prior to Visit  Medication Sig Dispense Refill  . acetaminophen (TYLENOL) 325 MG tablet Take 650 mg every 6 (six) hours as needed by mouth for mild pain.    [name] albuterol (PROVENTIL HFA;VENTOLIN HFA) 108 (90 BASE) MCG/ACT inhaler Inhale 2 puffs into the lungs every 6 (six) hours as needed for shortness of breath.     [name] aspirin 81 MG chewable tablet Chew 1 tablet (81 mg total) by mouth daily. 30 tablet 3  . beclomethasone (QVAR) 40 MCG/ACT inhaler Inhale 2 puffs into the lungs daily as needed (shortness of breath).     .  carvedilol (COREG) 12.5 MG tablet Take 0.5 tablets (6.25 mg total) by mouth 2 (two) times daily with a meal. 60 tablet 2  . clopidogrel (PLAVIX) 75 MG tablet Take 1 tablet (75 mg total) by mouth daily with breakfast. 90 tablet 1  . docusate sodium (COLACE) 100 MG capsule Take 1 capsule (100 mg total) by mouth 2 (two) times daily. 60 capsule 3  . furosemide (LASIX) 40 MG tablet Take 1 tablet (40 mg total) by mouth daily. 30 tablet 11  . levothyroxine (SYNTHROID, LEVOTHROID) 125 MCG tablet Take 125 mcg by mouth daily before breakfast.   0  . potassium chloride (K-DUR) 10 MEQ tablet Take 2 tablets (20 mEq total) by mouth daily. 60 tablet 3  . rosuvastatin (CRESTOR) 20 MG tablet Take 1 tablet (20 mg total) by mouth daily. 90 tablet 3  . spironolactone (ALDACTONE) 25 MG tablet Take 1 tablet (25 mg total) by mouth daily. 30 tablet 3   No facility-administered medications prior to visit.      Allergies:   Morphine and related   Social History   Socioeconomic History  . Marital status: Single    Spouse name: Not on file  . Number of children: Not on file  . Years of education: Not on file  . Highest education level: Not on file  Occupational History  . Not on file  Social Needs  . Financial resource strain: Not on file  . Food insecurity:    Worry: Not on file    Inability: Not on file  . Transportation needs:    Medical: Not on file    Non-medical: Not on file  Tobacco Use  . Smoking status: Former Smoker    Packs/day: 0.10    Years: 15.00    Pack years: 1.50    Types: Cigarettes    Last attempt to quit: 2011    Years since quitting: 8.5  . Smokeless tobacco: Never Used  Substance and Sexual Activity  . Alcohol use: Never    Frequency: Never  . Drug use: Never  . Sexual activity: Not Currently  Lifestyle  . Physical activity:    Days per week: Not on file    Minutes per session: Not on file  . Stress: Not on file  Relationships  . Social connections:    Talks on phone:  Not on file    Gets together: Not on file    Attends religious service: Not on file    Active member of club or organization: Not on file    Attends meetings of clubs or organizations: Not on file    Relationship status: Not on file  Other Topics Concern  . Not on file  Social History Narrative   Unemployed, on disability.  Now is finally reestablished with a PCP.   She is a former smoker, quit in 2011. Denies alcohol consumption   Tries to walk routinely.     Family History:  The patient's family history includes CVA in her mother; Heart disease in her mother.      ROS:   Please see the history of present illness.    ROS All other systems reviewed and are negative.   PHYSICAL EXAM:   VS:  BP (!) 80/56   Pulse (!) 36   Ht 5' (1.524 m)   Wt 192 lb (87.1 kg)   SpO2 99%   BMI 37.50 kg/m    GEN: Well nourished, well developed, in no acute distress , obese HEENT: normal  Neck: no JVD, carotid bruits, or masses Cardiac: RRR; very soft flow murmur. No rubs, or gallops,no edema  Respiratory:  clear to auscultation bilaterally, normal work of breathing GI: soft, nontender, nondistended, + BS MS: no deformity or atrophy  Skin: warm and dry, no rash. Soft non tender  Neuro:  Alert and Oriented x 3, Strength and sensation are intact Psych: euthymic mood, full affect    Wt Readings from Last 3 Encounters:  11/29/17 192 lb (87.1 kg)  11/23/17 196 lb 6.4 oz (89.1 kg)  11/17/17 196 lb 1.6 oz (89 kg)      Studies/Labs Reviewed:   EKG:  EKG is ordered today.  The ekg ordered today demonstrates sinus with HR 70 and ILBBB, TW abnormalities similar to previous.   Recent Labs: 11/17/2017: ALT 20; B Natriuretic Peptide 404.8 11/22/2017: Magnesium 2.0 11/23/2017: BUN 23; Creatinine, Ser 1.31; Hemoglobin 10.6; Platelets 122; Potassium 4.0; Sodium 142   Lipid Panel   Additional studies/ records that were reviewed today include:  TAVR OPERATIVE NOTE   Date of  Procedure:11/21/2017  Preoperative Diagnosis:Severeaorticinsufficiency and aortic stenosis  Procedure:   Transcatheter Aortic Valve Replacement - PercutaneousRightTransfemoral Approach Edwards Sapien 3 THV (size 68mm, model # 9600TFX, serial # [id number])  Co-Surgeons:Clarence H. Roxy Manns, MD and Sherren Mocha, MD  Pre-operative Echo Findings: ? Severe aorticinsufficiency and aortic stenosis ? Normalleft ventricular systolic function  Post-operative Echo Findings: ? Noparavalvular leak ? Normalleft ventricular systolic function   ________________   Post operative echo 11/22/17:  Study Conclusions - Left ventricle: The cavity size was normal. There was moderate concentric hypertrophy. Systolic function was mildly to moderately reduced. The estimated ejection fraction was in the range of 40% to 45%. Diffuse hypokinesis. Features are consistent with a pseudonormal left ventricular filling pattern, with concomitant abnormal relaxation and increased filling pressure (grade 2 diastolic dysfunction). Doppler parameters are consistent with elevated ventricular end-diastolic filling pressure. - Aortic valve: S/P TAVR with a 23 mm Edwards-SAPIEN 3 valve. There was mild stenosis. There was trivial regurgitation. Peak gradient (S): 38 mm Hg. Valve area (VTI): 0.73 cm^2. Valve area (Vmax): 0.58 cm^2. Mean velocity ratio of LVOT to aortic valve: 0.22. Valve area (Vmean): 0.63 cm^2. - Mitral valve: S/P mitral valve repair. The findings are consistent with moderate stenosis. There was mild regurgitation. Valve area by continuity equation (using LVOT flow): 0.71 cm^2. - Left atrium: The atrium was mildly dilated. Impressions: - S/P TAVR with a 23 mm Edwards-SAPIEN 3 valve. Transaortic gradients are mildly elevated with peak/mean 38/21 mmHg. There is trivial paravalvular leak. S/P  previous mitral valve repair with moderately elevated gradients (mean 9 mmHg).   ASSESSMENT & PLAN:   Severe bioprosthetic AI with moderateAS s/p TAVR: doing well. She is feeling better since her  valve surgery in terms of breathing. Groin sites are healing well.  She does get some exertional anterior leg pain that is new since surgery. No rest pain. Leg feels well perfused. Will get LE arterial dopplers to make sure preclose did not cause femoral artery stenosis. Continue ASA and plavix. SBE prophylaxis reviewed. She does not have any teeth and doesn't wear her dentures. I will see her back in 1 month for echo and valve clinic follow up.   Chronic combined S/D CHF: appear euvolemic. Continue current regimen. I will keep her off Entresto for now given low BPs. Continue Coreg 6.25mg  BID, spiro 25mg  daily and lasix 40mg  daily.    Hypothyroidism: continue synthroid  CKD: creat baseline 1.3-1.4 which has remained stable.   Hypotension: BP 82/56 today; this is asymptomatic and chronic for her.    Medication Adjustments/Labs and Tests Ordered: Current medicines are reviewed at length with the patient today.  Concerns regarding medicines are outlined above.  Medication changes, Labs and Tests ordered today are listed in the Patient Instructions below. Patient Instructions  Medication Instructions:  Your physician recommends that you continue on your current medications as directed. Please refer to the Current Medication list given to you today.   Labwork: none  Testing/Procedures: Your physician has requested that you have an echocardiogram. Echocardiography is a painless test that uses sound waves to create images of your heart. It provides your doctor with information about the size and shape of your heart and how well your heart's chambers and valves are working. This procedure takes approximately one hour. There are no restrictions for this procedure.  Scheduled for July  31,2019  Follow-Up: Follow up with K. Grandville Silos, [place] as planned on July 31    Any Other Special Instructions Will Be Listed Below (If Applicable).  Your physician discussed the importance of taking an antibiotic prior to any dental, gastrointestinal, genitourinary procedures to prevent damage to the heart valves from infection. You were given a prescription for an antibiotic based on current SBE prophylaxis guidelines.      If you need a refill on your cardiac medications before your next appointment, please call your pharmacy.      Signed, Angelena Form, PA-C  11/29/2017 3:04 PM    [facility] [address], [place], [place]  [address] Phone: [phone]; Fax: [phone]

## 2017-11-29 ENCOUNTER — Ambulatory Visit: Payer: Medicaid Other | Admitting: Physician Assistant

## 2017-11-29 ENCOUNTER — Encounter: Payer: Self-pay | Admitting: Physician Assistant

## 2017-11-29 ENCOUNTER — Telehealth: Payer: Self-pay | Admitting: *Deleted

## 2017-11-29 VITALS — BP 80/56 | HR 36 | Ht 60.0 in | Wt 192.0 lb

## 2017-11-29 DIAGNOSIS — Z952 Presence of prosthetic heart valve: Secondary | ICD-10-CM

## 2017-11-29 DIAGNOSIS — I099 Rheumatic heart disease, unspecified: Secondary | ICD-10-CM | POA: Diagnosis not present

## 2017-11-29 DIAGNOSIS — I5042 Chronic combined systolic (congestive) and diastolic (congestive) heart failure: Secondary | ICD-10-CM

## 2017-11-29 DIAGNOSIS — M79604 Pain in right leg: Secondary | ICD-10-CM

## 2017-11-29 NOTE — Patient Instructions (Signed)
Medication Instructions:  Your physician recommends that you continue on your current medications as directed. Please refer to the Current Medication list given to you today.   Labwork: none  Testing/Procedures: Your physician has requested that you have an echocardiogram. Echocardiography is a painless test that uses sound waves to create images of your heart. It provides your doctor with information about the size and shape of your heart and how well your heart's chambers and valves are working. This procedure takes approximately one hour. There are no restrictions for this procedure.  Scheduled for July 31,2019  Follow-Up: Follow up with K. Grandville Silos, [place] as planned on July 31    Any Other Special Instructions Will Be Listed Below (If Applicable).  Your physician discussed the importance of taking an antibiotic prior to any dental, gastrointestinal, genitourinary procedures to prevent damage to the heart valves from infection. You were given a prescription for an antibiotic based on current SBE prophylaxis guidelines.      If you need a refill on your cardiac medications before your next appointment, please call your pharmacy.

## 2017-11-29 NOTE — Telephone Encounter (Signed)
K. Grandville Silos, PA would like pt to have lower extremity arterial doppler study. I placed call to pt and left message to call back.

## 2017-12-05 NOTE — Telephone Encounter (Signed)
Left message to call back  

## 2017-12-12 NOTE — Telephone Encounter (Signed)
Left message to call back  

## 2017-12-13 ENCOUNTER — Telehealth (HOSPITAL_COMMUNITY): Payer: Self-pay

## 2017-12-13 NOTE — Telephone Encounter (Signed)
Called patient to schedule Cardiac Rehab - Scheduled orientation on 01/18/2018 on 8:15am. Patient will attend the 2:45pm exc class. Mailed packet.

## 2017-12-18 NOTE — Telephone Encounter (Signed)
Will arrange at [place] 7/31.

## 2017-12-18 NOTE — Progress Notes (Signed)
[facility] [place]                                       Cardiology Office Note    Date:  12/21/2017   ID:  [name], DOB [birth year], MRN [id number]  PCP:  Colleen Dials, PA-C  Cardiologist: Dr. Aundra Dubin / Dr. Burt Knack & Dr. Roxy Manns (TAVR)  CC:  1 month s/p TAVR   History of Present Illness:  [name] is a 57 y.o. 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   Patient has long-standing rheumatic valvular heart disease with chronic combined systolic and diastolic congestive heart failure status post aortic valve repair and mitral valve repair in 2010.She developedmoderate aortic stenosis withsevere aortic insufficiencyfor which shepresented more than 1 year ago with anacute exacerbation ofCHF.At that time she had severely depressed left ventricular systolic function and severe mitral regurgitation. Remarkably, shehas been doing much better for the past year on medical therapy with close follow-up through the advanced heart failure clinic. Follow-up echocardiograms have documented continued gradual improvement in left ventricular systolic function and resolution of mitral regurgitation. Recentlythe patient has remainedquite stable on her current medical regimen, with symptoms of exertional shortness of breath occurring only with more strenuous physical activityconsistent with chronic combined systolic and diastolic congestive heart failure, New York Heart Association functional class II.  RecentTEEdemonstrated further improvement in global left ventricular systolic function but mild to moderate aortic stenosis withsevere aortic insufficiency.After evaluation by the multidisciplinary valve team she  was deemed to be a suitable candidate for TAVR.  She underwent successful TAVR with a77mm Edwards Sapien 3 THV via the TF approach on 11/21/17. Post operative echoshowednormal LV function with mildly elevated transaortic valve gradients and no significant paravalvular regurgitation.She was discharged on ASA and plavix. Because of hypotension her Coreg was reduced and Entresto 24-26mg  held.  At follow up she complained of exertional right thigh pain. LE arterial dopplers were recommended but not completed.  Today she presents to clinic for follow up. She has noticed a huge improvement in energy levels and breathing since her TAVR. Even her kids can tell a big difference in her health. She is now able to do all of her house work and get out of the house doing everything she wants. No CP or SOB. No LE edema, orthopnea or PND. No dizziness or syncope. No blood in stool or urine. No palpitations. She is no longer getting exertional thigh pain.    Past Medical History:  Diagnosis Date  . Chronic combined systolic and diastolic CHF (congestive heart failure) (HCC)    EF now down to 25-30%. LVEDP on cath was 30 mmHg with wedge pressure of 31 mmHg.  . CKD (chronic kidney disease)   . Dementia    Early onset  . Essential hypertension 12/16/2013  . History of GI bleeding   . Hyperlipidemia with target LDL less than 100 12/16/2013  . Hypothyroidism 12/16/2013  . 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 Morbid obesity ([place])   . 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 Papillary thyroid carcinoma ([place])    [name] 05/10/2016  from [place]  . Post-menopausal bleeding 12/05/2011  . Pulmonary hypertension ([place]) 05/2016   By cardiac catheterization: mean RA 18, RV pressure/EDP: 70/12/18 mmHg.  mean PA 74/39 mean 52, mean PCWP 31 (with V wave of 45 mmHg), LVEDP ~ 30 mmHg.  [name] RAD (reactive airway disease)   . 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    August 2010: a/p AoV & MV repair for severe MR and moderate AI - Dr. Roxy Manns  Echo 12/2010: EF 40-45%, inferior hypokinesis; Slow progression of valvular Dz & drop in EF --> 05/2016: EF 25-30% (down from 40-45% post-op) with Mod AS/AI, mild MS & Severe MR (Cath & TEE 06/2016)   . S/P aortic valve repair 09/32/3557   suture plication of 3 commissures  . S/P MVR (mitral valve repair) 12/23/2008   26 mm Sorin [facility]O 3D Ring Annuloplasty  . 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 Progressive worsening of aortic and mitral valve disease.    Past Surgical History:  Procedure Laterality Date  . AORTIC VALVE REPAIR  August 3220   Suture plication of all 3 commissures  . CARDIAC CATHETERIZATION  August 2010   Preop: nonobstructive coronary disease  . [place]; 1996  . MITRAL VALVE REPAIR  August 2010    26 mm Sorin [facility] 3D Ring Annuloplasty  . RIGHT HEART CATH N/A 08/30/2016   Procedure: Right Heart Cath;  Surgeon: Larey Dresser, MD;  Location: [place] CV LAB;  Service: Cardiovascular;  Laterality: N/A;  . RIGHT/LEFT HEART CATH AND CORONARY ANGIOGRAPHY N/A 07/08/2016   Procedure: Right/Left Heart Cath and Coronary Angiography;  Surgeon: Leonie Man, MD;  Location: [place] CV LAB;  Service: Cardiovascular: Normal coronaries, mean RA 18, RV pressure/EDP: 70/12/18 mmHg.  mean PA 74/39 mean 52, mean PCWP 31 (with V wave of 45 mmHg), LVEDP ~ 30 mmHg.  CI 1.98 Fick. Peak AoV gradient ~15 mHg    . TEE WITHOUT CARDIOVERSION N/A 07/13/2016   Procedure: Transesophageal Echocardiogram (TEE);  Surgeon: [name], MD;  Location: [facility] ENDOSCOPY;  Service: Cardiovascular:  EF 25-30%, dilated LV - diffuse hypokinesis, rheumatic-appearing Aortic Vallve s/p repair with  moderate AS (AVA 1.1 cm^2), moderate central AI.  MV s/p repeat with mild mitral stenosis and eccentric severe MR.    . TEE WITHOUT CARDIOVERSION    . TEE WITHOUT CARDIOVERSION N/A 06/28/2017   Procedure: TRANSESOPHAGEAL ECHOCARDIOGRAM (TEE);  Surgeon: Larey Dresser, MD;  Location: [facility] ENDOSCOPY;  Service: Cardiovascular;  Laterality: N/A;  . TEE WITHOUT CARDIOVERSION N/A 11/21/2017   Procedure: TRANSESOPHAGEAL ECHOCARDIOGRAM (TEE);  Surgeon: Sherren Mocha, MD;  Location: [place] [facility];  Service: Open Heart Surgery;  Laterality: N/A;  . THYROID LOBECTOMY Left 09/17/2009   which showed a 3.5 cm follicular variant papillary cancer  . THYROIDECTOMY     "2nd OR on my thyroid they took out the whole thing"  . TRANSCATHETER AORTIC VALVE REPLACEMENT, TRANSFEMORAL  11/21/2017   Transcatheter Aortic Valve Replacement - Percutaneous Right Transfemoral Approach  . TRANSCATHETER AORTIC VALVE REPLACEMENT, TRANSFEMORAL N/A 11/21/2017   Procedure: TRANSCATHETER AORTIC VALVE REPLACEMENT, TRANSFEMORAL using a 45mm Edwards Sapien 3 Aortic Valve;  Surgeon: Sherren Mocha, MD;  Location: [facility] [facility];  Service: Open Heart Surgery;  Laterality: N/A;  . TRANSTHORACIC ECHOCARDIOGRAM  05/2016  EF 25-30%. Moderate aortic stenosis with moderate regurgitation. Also potential aortic stenosis with likely worse than expected regurgitation  . TRANSTHORACIC ECHOCARDIOGRAM  May 2012    EF 40-45%, inferior hypokinesis. Slightly increased transaortic velocity = mild AS with mild to moderate AI normal MV gradients with no MR. Significantly improved PA pressures. Paradoxical septal motion.    Current Medications: Outpatient Medications Prior to Visit  Medication Sig Dispense Refill  . acetaminophen (TYLENOL) 325 MG tablet Take 650 mg every 6 (six) hours as needed by mouth for mild pain.    [name] albuterol (PROVENTIL HFA;VENTOLIN HFA) 108 (90 BASE) MCG/ACT inhaler Inhale 2 puffs into the lungs every 6 (six) hours as needed for shortness of breath.      [name] aspirin 81 MG chewable tablet Chew 1 tablet (81 mg total) by mouth daily. 30 tablet 3  . beclomethasone (QVAR) 40 MCG/ACT inhaler Inhale 2 puffs into the lungs daily as needed (shortness of breath).     . carvedilol (COREG) 12.5 MG tablet Take 0.5 tablets (6.25 mg total) by mouth 2 (two) times daily with a meal. 60 tablet 2  . clopidogrel (PLAVIX) 75 MG tablet Take 1 tablet (75 mg total) by mouth daily with breakfast. 90 tablet 1  . docusate sodium (COLACE) 100 MG capsule Take 1 capsule (100 mg total) by mouth 2 (two) times daily. 60 capsule 3  . furosemide (LASIX) 40 MG tablet Take 1 tablet (40 mg total) by mouth daily. 30 tablet 11  . levothyroxine (SYNTHROID, LEVOTHROID) 125 MCG tablet Take 125 mcg by mouth daily before breakfast.   0  . potassium chloride (K-DUR) 10 MEQ tablet Take 2 tablets (20 mEq total) by mouth daily. 60 tablet 3  . rosuvastatin (CRESTOR) 20 MG tablet Take 1 tablet (20 mg total) by mouth daily. 90 tablet 3  . spironolactone (ALDACTONE) 25 MG tablet Take 1 tablet (25 mg total) by mouth daily. 30 tablet 3   No facility-administered medications prior to visit.      Allergies:   Morphine and related   Social History   Socioeconomic History  . Marital status: Single    Spouse name: Not on file  . Number of children: Not on file  . Years of education: Not on file  . Highest education level: Not on file  Occupational History  . Not on file  Social Needs  . Financial resource strain: Not on file  . Food insecurity:    Worry: Not on file    Inability: Not on file  . Transportation needs:    Medical: Not on file    Non-medical: Not on file  Tobacco Use  . Smoking status: Former Smoker    Packs/day: 0.10    Years: 15.00    Pack years: 1.50    Types: Cigarettes    Last attempt to quit: 2011    Years since quitting: 8.5  . Smokeless tobacco: Never Used  Substance and Sexual Activity  . Alcohol use: Never    Frequency: Never  . Drug use: Never  . Sexual  activity: Not Currently  Lifestyle  . Physical activity:    Days per week: Not on file    Minutes per session: Not on file  . Stress: Not on file  Relationships  . Social connections:    Talks on phone: Not on file    Gets together: Not on file    Attends religious service: Not on file    Active member of club or organization:  Not on file    Attends meetings of clubs or organizations: Not on file    Relationship status: Not on file  Other Topics Concern  . Not on file  Social History Narrative   Unemployed, on disability.   Now is finally reestablished with a PCP.   She is a former smoker, quit in 2011. Denies alcohol consumption   Tries to walk routinely.     Family History:  The patient's family history includes CVA in her mother; Heart disease in her mother.      VS:  BP 118/70   Pulse 61   Ht 5' (1.524 m)   Wt 193 lb 12.8 oz (87.9 kg)   SpO2 99%   BMI 37.85 kg/m    GEN: Well nourished, well developed, in no acute distress. obese HEENT: normal  Neck: no JVD, carotid bruits, or masses Cardiac: RRR; no murmurs, rubs, or gallops,no edema  Respiratory:  clear to auscultation bilaterally, normal work of breathing GI: soft, nontender, nondistended, + BS MS: no deformity or atrophy  Skin: warm and dry, no rash Neuro:  Alert and Oriented x 3, Strength and sensation are intact Psych: euthymic mood, full affec    Wt Readings from Last 3 Encounters:  12/20/17 193 lb 12.8 oz (87.9 kg)  11/29/17 192 lb (87.1 kg)  11/23/17 196 lb 6.4 oz (89.1 kg)      Studies/Labs Reviewed:   EKG:  EKG is NOT ordered today.   Recent Labs: 11/17/2017: ALT 20; B Natriuretic Peptide 404.8 11/22/2017: Magnesium 2.0 11/23/2017: BUN 23; Creatinine, Ser 1.31; Hemoglobin 10.6; Platelets 122; Potassium 4.0; Sodium 142     Additional studies/ records that were reviewed today include:  TAVR OPERATIVE NOTE   Date of Procedure:11/21/2017  Preoperative  Diagnosis:Severeaorticinsufficiency and aortic stenosis  Procedure:   Transcatheter Aortic Valve Replacement - PercutaneousRightTransfemoral Approach Edwards Sapien 3 THV (size 88mm, model # 9600TFX, serial # [id number])  Co-Surgeons:Clarence H. Roxy Manns, MD and Sherren Mocha, MD  Pre-operative Echo Findings: ? Severe aorticinsufficiency and aortic stenosis ? Normalleft ventricular systolic function  Post-operative Echo Findings: ? Noparavalvular leak ? Normalleft ventricular systolic function   ________________   Post operative echo 11/22/17: Study Conclusions - Left ventricle: The cavity size was normal. There was moderate concentric hypertrophy. Systolic function was mildly to moderately reduced. The estimated ejection fraction was in the range of 40% to 45%. Diffuse hypokinesis. Features are consistent with a pseudonormal left ventricular filling pattern, with concomitant abnormal relaxation and increased filling pressure (grade 2 diastolic dysfunction). Doppler parameters are consistent with elevated ventricular end-diastolic filling pressure. - Aortic valve: S/P TAVR with a 23 mm Edwards-SAPIEN 3 valve. There was mild stenosis. There was trivial regurgitation. Peak gradient (S): 38 mm Hg. Valve area (VTI): 0.73 cm^2. Valve area (Vmax): 0.58 cm^2. Mean velocity ratio of LVOT to aortic valve: 0.22. Valve area (Vmean): 0.63 cm^2. - Mitral valve: S/P mitral valve repair. The findings are consistent with moderate stenosis. There was mild regurgitation. Valve area by continuity equation (using LVOT flow): 0.71 cm^2. - Left atrium: The atrium was mildly dilated. Impressions: - S/P TAVR with a 23 mm Edwards-SAPIEN 3 valve. Transaortic gradients are mildly elevated with peak/mean 38/21 mmHg. There is trivial paravalvular leak. S/P previous mitral valve repair with moderately  elevated gradients (mean 9 mmHg).  ________________  2D ECHO 12/20/17 (1 month s/p TAVR) Study Conclusions - Left ventricle: The cavity size was normal. Wall thickness was   increased in a pattern of moderate LVH. There  was focal basal   hypertrophy. Systolic function was moderately to severely   reduced. The estimated ejection fraction was in the range of 30%   to 35%. Features are consistent with a pseudonormal left   ventricular filling pattern, with concomitant abnormal relaxation   and increased filling pressure (grade 2 diastolic dysfunction). - Aortic valve: A bioprosthesis was present. - Mitral valve: Moderately calcified annulus. Moderately thickened,   moderately calcified leaflets . The findings are consistent with   mild stenosis. - Left atrium: The atrium was mildly dilated.    ASSESSMENT & PLAN:   Severe bioprosthetic AI with moderateAS s/p TAVR: doing excellent. She has notice a huge improvement in her symptoms. 2D ECHO today shows EF 30-35%, normally functioning TAVR with mean gradient 14 mm Hg and now PVL. She has NHYA class I symptoms. She is going to have dentures fitted soon so I will call in a Rx for Amoxil. I will see her back in 1year in the valve clinic with an echo.  Chronic combined S/D CHF: appear euvolemic. Continue Coreg 6.25mg  BID, spiro 25mg  daily and lasix 40mg  daily. Entresto 24/26mg  was held at discharge because of hypotension. Her BP has now improved so I will start It back today and get a BMET in 2 weeks. She has follow up with Dr. Aundra Dubin in early September.  Hypothyroidism: continue synthroid   WIO:MBTDH baseline ~ 1.3. This has remained stable. BMET in a couple weeks after restart of Entresto  Hypotension: BP has improved. I will restart lowest dose of Entresto   LE pain: I was going to get LE arterial dopplers but this pain has resolved.   Medication Adjustments/Labs and Tests Ordered: Current medicines are reviewed at length with  the patient today.  Concerns regarding medicines are outlined above.  Medication changes, Labs and Tests ordered today are listed in the Patient Instructions below. Patient Instructions  Medication Instructions:  1) RESTART ENTRESTO 24/26 mg  2) Your physician discussed the importance of taking an antibiotic prior to any dental, gastrointestinal, genitourinary procedures to prevent damage to the heart valves from infection. You were given a prescription for an antibiotic based on current SBE prophylaxis guidelines. You have been called in AMOXIL 2,000 mg to take one hour prior to dental visits.   Labwork: BMET in 2 weeks.  Testing/Procedures: None  Follow-Up: Please keep your appointment with Dr. Aundra Dubin.  You will be called to arrange your one year TAVR echo and office visit next year.    Any Other Special Instructions Will Be Listed Below (If Applicable).     If you need a refill on your cardiac medications before your next appointment, please call your pharmacy.      Signed, Angelena Form, PA-C  12/21/2017 9:54 AM    [facility] [address], [place], [place]  [address] Phone: [phone]; Fax: [phone]

## 2017-12-20 ENCOUNTER — Ambulatory Visit (HOSPITAL_COMMUNITY): Payer: Medicaid Other | Attending: Cardiovascular Disease

## 2017-12-20 ENCOUNTER — Encounter: Payer: Self-pay | Admitting: Physician Assistant

## 2017-12-20 ENCOUNTER — Ambulatory Visit (INDEPENDENT_AMBULATORY_CARE_PROVIDER_SITE_OTHER): Payer: Medicaid Other | Admitting: Physician Assistant

## 2017-12-20 ENCOUNTER — Other Ambulatory Visit: Payer: Self-pay

## 2017-12-20 VITALS — BP 118/70 | HR 61 | Ht 60.0 in | Wt 193.8 lb

## 2017-12-20 DIAGNOSIS — I9589 Other hypotension: Secondary | ICD-10-CM | POA: Diagnosis not present

## 2017-12-20 DIAGNOSIS — I050 Rheumatic mitral stenosis: Secondary | ICD-10-CM | POA: Insufficient documentation

## 2017-12-20 DIAGNOSIS — Z6837 Body mass index (BMI) 37.0-37.9, adult: Secondary | ICD-10-CM | POA: Insufficient documentation

## 2017-12-20 DIAGNOSIS — Z952 Presence of prosthetic heart valve: Secondary | ICD-10-CM

## 2017-12-20 DIAGNOSIS — N189 Chronic kidney disease, unspecified: Secondary | ICD-10-CM | POA: Diagnosis not present

## 2017-12-20 DIAGNOSIS — I509 Heart failure, unspecified: Secondary | ICD-10-CM | POA: Diagnosis not present

## 2017-12-20 DIAGNOSIS — I110 Hypertensive heart disease with heart failure: Secondary | ICD-10-CM | POA: Insufficient documentation

## 2017-12-20 DIAGNOSIS — E785 Hyperlipidemia, unspecified: Secondary | ICD-10-CM | POA: Insufficient documentation

## 2017-12-20 DIAGNOSIS — I5042 Chronic combined systolic (congestive) and diastolic (congestive) heart failure: Secondary | ICD-10-CM | POA: Diagnosis not present

## 2017-12-20 MED ORDER — SACUBITRIL-VALSARTAN 24-26 MG PO TABS: 1.0000 | ORAL_TABLET | Freq: Two times a day (BID) | ORAL | 11 refills | Status: DC

## 2017-12-20 MED ORDER — AMOXICILLIN 500 MG PO TABS: ORAL_TABLET | ORAL | 6 refills | Status: DC

## 2017-12-20 NOTE — Patient Instructions (Addendum)
Medication Instructions:  1) RESTART ENTRESTO 24/26 mg  2) Your physician discussed the importance of taking an antibiotic prior to any dental, gastrointestinal, genitourinary procedures to prevent damage to the heart valves from infection. You were given a prescription for an antibiotic based on current SBE prophylaxis guidelines. You have been called in AMOXIL 2,000 mg to take one hour prior to dental visits.   Labwork: BMET in 2 weeks.  Testing/Procedures: None  Follow-Up: Please keep your appointment with Dr. Aundra Dubin.  You will be called to arrange your one year TAVR echo and office visit next year.    Any Other Special Instructions Will Be Listed Below (If Applicable).     If you need a refill on your cardiac medications before your next appointment, please call your pharmacy.

## 2017-12-25 ENCOUNTER — Encounter: Payer: Self-pay | Admitting: Thoracic Surgery (Cardiothoracic Vascular Surgery)

## 2017-12-26 ENCOUNTER — Encounter: Payer: Self-pay | Admitting: Thoracic Surgery (Cardiothoracic Vascular Surgery)

## 2018-01-04 ENCOUNTER — Encounter (INDEPENDENT_AMBULATORY_CARE_PROVIDER_SITE_OTHER): Payer: Self-pay

## 2018-01-04 ENCOUNTER — Other Ambulatory Visit: Payer: Medicaid Other | Admitting: *Deleted

## 2018-01-04 DIAGNOSIS — I5042 Chronic combined systolic (congestive) and diastolic (congestive) heart failure: Secondary | ICD-10-CM

## 2018-01-05 ENCOUNTER — Inpatient Hospital Stay (HOSPITAL_COMMUNITY): Admission: RE | Admit: 2018-01-05 | Payer: Medicaid Other | Source: Ambulatory Visit

## 2018-01-05 LAB — BASIC METABOLIC PANEL
BUN/Creatinine Ratio: 18 (ref 9–23)
BUN: 24 mg/dL (ref 6–24)
CALCIUM: 9.5 mg/dL (ref 8.7–10.2)
CO2: 21 mmol/L (ref 20–29)
Chloride: 98 mmol/L (ref 96–106)
Creatinine, Ser: 1.33 mg/dL — ABNORMAL HIGH (ref 0.57–1.00)
GFR calc Af Amer: 52 mL/min/{1.73_m2} — ABNORMAL LOW (ref >59)
GFR, EST NON AFRICAN AMERICAN: 45 mL/min/{1.73_m2} — AB (ref >59)
Glucose: 110 mg/dL — ABNORMAL HIGH (ref 65–99)
POTASSIUM: 3.9 mmol/L (ref 3.5–5.2)
SODIUM: 138 mmol/L (ref 134–144)

## 2018-01-11 ENCOUNTER — Telehealth (HOSPITAL_COMMUNITY): Payer: Self-pay

## 2018-01-11 NOTE — Telephone Encounter (Signed)
Cardiac Rehab Medication Review by a Pharmacist  Does the patient  feel that his/her medications are working for him/her?  yes  Has the patient been experiencing any side effects to the medications prescribed?  no  Does the patient measure his/her own blood pressure or blood glucose at home?  no   Does the patient have any problems obtaining medications due to transportation or finances?   yes  Understanding of regimen: good Understanding of indications: good Potential of compliance: good  Pharmacist comments:  Patient states transportation is difficult for her to arrange at times.  Counseled patient to discuss with rehab team, case management, and/or social work about possible transportation services to assist her with coming to appointments  Counseled patient to investigate prescription delivery services with local pharmacies and ensuring medications are requested/auto-filled in advance to ensure she has ample time to arrange transportation.  Vertis Kelch, PharmD PGY1 Pharmacy Resident Phone [phone] 01/11/2018       5:21 PM

## 2018-01-15 ENCOUNTER — Other Ambulatory Visit (HOSPITAL_COMMUNITY): Payer: Self-pay | Admitting: Student

## 2018-01-18 ENCOUNTER — Ambulatory Visit (HOSPITAL_COMMUNITY): Payer: Medicaid Other

## 2018-01-18 ENCOUNTER — Telehealth (HOSPITAL_COMMUNITY): Payer: Self-pay

## 2018-01-18 NOTE — Telephone Encounter (Signed)
Attempted to call patient in regards to rescheduling for Cardiac Rehab - LM on VM

## 2018-01-19 ENCOUNTER — Inpatient Hospital Stay (HOSPITAL_COMMUNITY): Admission: RE | Admit: 2018-01-19 | Payer: Medicaid Other | Source: Ambulatory Visit

## 2018-01-23 ENCOUNTER — Ambulatory Visit (HOSPITAL_COMMUNITY)
Admission: RE | Admit: 2018-01-23 | Discharge: 2018-01-23 | Disposition: A | Discharge status: Home / Self Care | Payer: Medicaid Other | Source: Ambulatory Visit | Attending: Cardiology | Admitting: Cardiology

## 2018-01-23 VITALS — BP 102/60 | HR 69 | Wt 193.6 lb

## 2018-01-23 DIAGNOSIS — I5022 Chronic systolic (congestive) heart failure: Secondary | ICD-10-CM | POA: Insufficient documentation

## 2018-01-23 DIAGNOSIS — Z79899 Other long term (current) drug therapy: Secondary | ICD-10-CM | POA: Insufficient documentation

## 2018-01-23 DIAGNOSIS — I739 Peripheral vascular disease, unspecified: Secondary | ICD-10-CM | POA: Insufficient documentation

## 2018-01-23 DIAGNOSIS — Z87891 Personal history of nicotine dependence: Secondary | ICD-10-CM | POA: Diagnosis not present

## 2018-01-23 DIAGNOSIS — I080 Rheumatic disorders of both mitral and aortic valves: Secondary | ICD-10-CM | POA: Insufficient documentation

## 2018-01-23 DIAGNOSIS — Z952 Presence of prosthetic heart valve: Secondary | ICD-10-CM | POA: Diagnosis not present

## 2018-01-23 DIAGNOSIS — N183 Chronic kidney disease, stage 3 (moderate): Secondary | ICD-10-CM | POA: Insufficient documentation

## 2018-01-23 DIAGNOSIS — E785 Hyperlipidemia, unspecified: Secondary | ICD-10-CM | POA: Insufficient documentation

## 2018-01-23 DIAGNOSIS — I5042 Chronic combined systolic (congestive) and diastolic (congestive) heart failure: Secondary | ICD-10-CM | POA: Diagnosis not present

## 2018-01-23 DIAGNOSIS — I959 Hypotension, unspecified: Secondary | ICD-10-CM | POA: Insufficient documentation

## 2018-01-23 DIAGNOSIS — E039 Hypothyroidism, unspecified: Secondary | ICD-10-CM | POA: Insufficient documentation

## 2018-01-23 DIAGNOSIS — Z7982 Long term (current) use of aspirin: Secondary | ICD-10-CM | POA: Diagnosis not present

## 2018-01-23 DIAGNOSIS — I130 Hypertensive heart and chronic kidney disease with heart failure and stage 1 through stage 4 chronic kidney disease, or unspecified chronic kidney disease: Secondary | ICD-10-CM | POA: Insufficient documentation

## 2018-01-23 DIAGNOSIS — I272 Pulmonary hypertension, unspecified: Secondary | ICD-10-CM

## 2018-01-23 LAB — BASIC METABOLIC PANEL
ANION GAP: 9 (ref 5–15)
BUN: 31 mg/dL — ABNORMAL HIGH (ref 6–20)
CO2: 27 mmol/L (ref 22–32)
Calcium: 9.3 mg/dL (ref 8.9–10.3)
Chloride: 104 mmol/L (ref 98–111)
Creatinine, Ser: 1.46 mg/dL — ABNORMAL HIGH (ref 0.44–1.00)
GFR calc non Af Amer: 39 mL/min — ABNORMAL LOW (ref >60)
GFR, EST AFRICAN AMERICAN: 45 mL/min — AB (ref >60)
Glucose, Bld: 118 mg/dL — ABNORMAL HIGH (ref 70–99)
POTASSIUM: 3.7 mmol/L (ref 3.5–5.1)
Sodium: 140 mmol/L (ref 135–145)

## 2018-01-23 NOTE — Patient Instructions (Signed)
Lab today  Your physician has requested that you have a lower extremity arterial exercise duplex. During this test, exercise and ultrasound are used to evaluate arterial blood flow in the legs. Allow one hour for this exam. There are no restrictions or special instructions.  You have been referred to Cardiac Rehab  Your physician recommends that you schedule a follow-up appointment in: 2 months

## 2018-01-24 NOTE — Progress Notes (Signed)
Advanced Heart Failure Clinic Note   PCP: Roe Coombs Cardiology: Dr. Ellyn Hack HF Cardiology: Dr. Aundra Dubin  57 y.o. 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  She had aortic and mitral repairs in 2010.  Afterwards, EF was mildly low in the 40-45% range.  However, EF had dropped to 25-30% on 1/18 echo.  TEE showed severe MR, moderate AI, moderate aortic stenosis.  RHC/LHC in 2/18 showed significantly elevated LV and RV filling pressures.    At her initial appointment here, she was very dyspneic with exertion, with orthopnea and PND. Her meds were adjusted and Lasix increased.  She has seen Dr. Roxy Manns since that time for evaluation for redo valve surgery.  She would require high risk mitral and aortic valve replacements. For now, the plan will be close monitoring with surgery if starts to decline or has CHF exacerbation. CPX was done in 3/18 showing moderate to severely decreased functional capacity.  RHC in 4/18 showed optimized filling pressures but low cardiac output.  Repeat echo in 8/18 showed EF 30-35%, severe AI with moderate AS but only mild MR noted.  The RV was mildly dilated with mildly decreased systolic function.   TEE 06/28/2017: EF 45%, severe AI, mild to moderate AS, mild mitral valve stenosis s/p MV repair  In 7/19, she had TAVR with #23 Edwards Sapien 3 THV.  Echo post-op in 7/19 showed EF 30-35%, moderate LVH, bioprosthetic aortic valve looked ok, s/p MV repair with mild mitral stenosis.   She returns for followup of CHF and aortic valve disease.  Breathing is significantly improved since TAVR.  No significant dyspnea walking on flat ground. No chest pain.  No orthopnea/PND.  She has been having right thigh pain with long walks.  Weight down 2 lbs on our scale.  Occasional mild lightheadedness with standing.   Labs (2/18): K 3.8 => 3.4, creatinine 1.09 => 1.07, hgb 12.7, BNP 1010 Labs (3/18): K 4.6, creatinine  2.43 => 1.74 Labs (4/18): K 4, creatinine 1.28, digoxin 0.3 Labs 09/2016: K 4.4, creatinine 1.26.  Labs 01/19/2017: K 4.4, Creatinine 1.21, digoxin 0.3 Labs (10/18): K 4.7, creatinine 1.10, digoxin 0.3 Labs (12/18): K 4.9, creatinine 1.34, digoxin < 0.2 Labs (8/19): K 3.9, creatinine 1.33  PMH: 1. HTN 2. Hypothyroidism 3. Hyperlipidemia 4. Rheumatic heart disease: s/p MV repair and aortic valve repair in 2010, Dr. Roxy Manns. - TEE (2/18): EF 35-30%, dilated LV, rheumatic-appearing aortic valve s/p repair with moderate AS (AVA 1.1 cm^2), moderate AI.  MV s/p repair with mild-moderate mitral stenosis and eccentric severe MR.   - Echo (8/18): Severe AI but only moderate AS, MR was only mild on this study (may have missed full jet) with mean MV gradient 4 mmHg.  - TEE 06/28/2017: EF 45%, severe AI, mild to moderate AS, mild mitral valve stenosis s/p MV repair - In 7/19, she had TAVR with #23 Edwards Sapien 3 THV.   - Echo post-op in 7/19 showed EF 30-35%, moderate LVH, bioprosthetic aortic valve looked ok, s/p MV repair with mild mitral stenosis.  5. Chronic systolic CHF: Nonischemic cardiomyopathy, probably related to valvular disease.   - Echo (11/15): EF 40-45%, - Echo (12/16): EF 35-40%. - Echo (1/18): EF 25-30%.  - LHC/RHC (2/18): Normal coronaries, mean RA 18, mean PA 74/39 mean 52, mean PCWP 31, CI 1.98 Fick. - CPX (3/18): peak VO2 11.7, VE/VCO2 slope 40, RER 1.11 => moderate to severely  decreased functional capacity due to HF.  - RHC (4/18): mean RA 4, PA 44/17 mean 28, mean PCWP 15, CI 1.73, PVR 4 WU.  - Echo (8/18): EF 30-35%, severe AI with moderate AS but only mild MR noted with mean MV gradient 4 mmHg.  The RV was mildly dilated with mildly decreased systolic function. - TEE 1/02: EF 45%, severe aortic insufficiency, mild to moderate AS, mild mitral valve stenosis s/p MV repair - Coronary CTA (5/19): No obstructive coronary disease.  - Echo (7/19): EF 30-35%, moderate LVH, bioprosthetic  aortic valve looked ok, s/p MV repair with mild mitral stenosis.  6. Carotid dopplers (6/19): No significant disease.   Social History   Socioeconomic History  . Marital status: Single    Spouse name: Not on file  . Number of children: Not on file  . Years of education: Not on file  . Highest education level: Not on file  Occupational History  . Not on file  Social Needs  . Financial resource strain: Not on file  . Food insecurity:    Worry: Not on file    Inability: Not on file  . Transportation needs:    Medical: Not on file    Non-medical: Not on file  Tobacco Use  . Smoking status: Former Smoker    Packs/day: 0.10    Years: 15.00    Pack years: 1.50    Types: Cigarettes    Last attempt to quit: 2011    Years since quitting: 8.6  . Smokeless tobacco: Never Used  Substance and Sexual Activity  . Alcohol use: Never    Frequency: Never  . Drug use: Never  . Sexual activity: Not Currently  Lifestyle  . Physical activity:    Days per week: Not on file    Minutes per session: Not on file  . Stress: Not on file  Relationships  . Social connections:    Talks on phone: Not on file    Gets together: Not on file    Attends religious service: Not on file    Active member of club or organization: Not on file    Attends meetings of clubs or organizations: Not on file    Relationship status: Not on file  . Intimate partner violence:    Fear of current or ex partner: Not on file    Emotionally abused: Not on file    Physically abused: Not on file    Forced sexual activity: Not on file  Other Topics Concern  . Not on file  Social History Narrative   Unemployed, on disability.   Now is finally reestablished with a PCP.   She is a former smoker, quit in 2011. Denies alcohol consumption   Tries to walk routinely.   Family History  Problem Relation Age of Onset  . Heart disease Mother   . CVA Mother    Review of systems complete and found to be negative unless listed  in HPI.    Current Outpatient Medications  Medication Sig Dispense Refill  . acetaminophen (TYLENOL) 325 MG tablet Take 650 mg every 6 (six) hours as needed by mouth for mild pain.    [name] albuterol (PROVENTIL HFA;VENTOLIN HFA) 108 (90 BASE) MCG/ACT inhaler Inhale 2 puffs into the lungs every 6 (six) hours as needed for shortness of breath.     [name] aspirin 81 MG chewable tablet Chew 1 tablet (81 mg total) by mouth daily. 30 tablet 3  . beclomethasone (QVAR) 40 MCG/ACT inhaler  Inhale 2 puffs into the lungs daily as needed (shortness of breath).     . carvedilol (COREG) 12.5 MG tablet Take 0.5 tablets (6.25 mg total) by mouth 2 (two) times daily with a meal. 60 tablet 2  . clopidogrel (PLAVIX) 75 MG tablet Take 1 tablet (75 mg total) by mouth daily with breakfast. 90 tablet 1  . docusate sodium (COLACE) 100 MG capsule Take 1 capsule (100 mg total) by mouth 2 (two) times daily. 60 capsule 3  . furosemide (LASIX) 40 MG tablet Take 1 tablet (40 mg total) by mouth daily. 30 tablet 11  . levothyroxine (SYNTHROID, LEVOTHROID) 125 MCG tablet Take 125 mcg by mouth daily before breakfast.   0  . potassium chloride (K-DUR) 10 MEQ tablet Take 2 tablets (20 mEq total) by mouth daily. 60 tablet 3  . rosuvastatin (CRESTOR) 20 MG tablet TAKE 1 TABLET BY MOUTH ONCE DAILY 90 tablet 0  . sacubitril-valsartan (ENTRESTO) 24-26 MG Take 1 tablet by mouth 2 (two) times daily. 60 tablet 11  . spironolactone (ALDACTONE) 25 MG tablet Take 1 tablet (25 mg total) by mouth daily. 30 tablet 3   No current facility-administered medications for this encounter.    Vitals:   01/23/18 1417  BP: 102/60  Pulse: 69  SpO2: 96%  Weight: 87.8 kg (193 lb 9.6 oz)    Wt Readings from Last 3 Encounters:  01/23/18 87.8 kg (193 lb 9.6 oz)  12/20/17 87.9 kg (193 lb 12.8 oz)  11/29/17 87.1 kg (192 lb)   Orthostatics:  Sitting: 86/62 Standing: 84/60  Physical Exam  General: NAD Neck: No JVD, no thyromegaly or thyroid nodule.  Lungs:  Clear to auscultation bilaterally with normal respiratory effort. CV: Nondisplaced PMI.  Heart regular S1/S2, no S3/S4, 1/6 SEM RUSB.  No peripheral edema.  No carotid bruit.  Difficult to palpate pedal pulses.  Abdomen: Soft, nontender, no hepatosplenomegaly, no distention.  Skin: Intact without lesions or rashes.  Neurologic: Alert and oriented x 3.  Psych: Normal affect. Extremities: No clubbing or cyanosis.  HEENT: Normal.   Assessment/Plan: 1. 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  She had aortic and mitral valve repairs in 2010.   It was felt that she would not be a very good Mitraclip candidate as she already has a degree of mitral stenosis.  TEE in 2/18 showed moderate AS/moderate AI , mild to moderate MS, severe eccentric MR.  Echo in 8/18 showed moderate AS/severe AI but only mild mitral stenosis and mild MR noted. Hemodynamics looked better on 4/18 RHC though cardiac output still marginal.  Repeat TEE 06/28/2017: EF 45%, severe aortic insufficiency, mild to moderate AS, mild mitral valve stenosis s/p MV repair with trivial MR. She had TAVR in 7/19, feeling much better since then.  Post-op echo reviewed today showed stable bioprosthetic aortic valve, mild mitral stenosis s/p MV repair.  2. Chronic systolic CHF:  Nonischemic cardiomyopathy (no significant disease on 2/18 LHC).  It is possible that this cardiomyopathy is driven by her valvular disease. Echo (8/18) with EF 30-35% RV mildly dilated. TEE 06/28/2017: EF 45%, severe aortic insufficiency, mild to moderate AS, mild mitral valve stenosis s/p MV repair with only trivial MR. Post-TAVR echo showed EF down to 30-35%.  NYHA class II symptoms, not volume overloaded on exam.  - Continue lasix 40 mg daily + 20 meq K.  BMET today.  - Continue Entresto 24/26 bid.    - Bidil stopped with hypotension - Continue spironolactone 25 mg daily.  -  Continue Coreg 6.25 mg bid.  - Repeat echo in 6 months, consider ICD if EF is still < 35%.  3. CKD  stage II-III:  - BMET today.  4.?Claudication: Right thigh pain with ambulation.  - I will arrange for peripheral arterial doppler evaluation.   Followup in 2 months.   Colleen Champagne, MD 01/24/2018

## 2018-01-25 ENCOUNTER — Telehealth (HOSPITAL_COMMUNITY): Payer: Self-pay

## 2018-01-25 NOTE — Telephone Encounter (Signed)
2nd attempt to contact patient in regards to rescheduling Cardiac Rehab - lm on vm

## 2018-01-26 ENCOUNTER — Ambulatory Visit (HOSPITAL_COMMUNITY): Payer: Medicaid Other

## 2018-01-26 ENCOUNTER — Other Ambulatory Visit (HOSPITAL_COMMUNITY): Payer: Self-pay | Admitting: Cardiology

## 2018-01-26 DIAGNOSIS — I739 Peripheral vascular disease, unspecified: Secondary | ICD-10-CM

## 2018-01-29 ENCOUNTER — Ambulatory Visit (HOSPITAL_COMMUNITY): Payer: Medicaid Other

## 2018-01-30 ENCOUNTER — Ambulatory Visit (HOSPITAL_COMMUNITY)
Admission: RE | Admit: 2018-01-30 | Payer: Medicaid Other | Source: Ambulatory Visit | Attending: Cardiology | Admitting: Cardiology

## 2018-01-31 ENCOUNTER — Ambulatory Visit (HOSPITAL_COMMUNITY): Payer: Medicaid Other

## 2018-02-01 ENCOUNTER — Telehealth (HOSPITAL_COMMUNITY): Payer: Self-pay

## 2018-02-01 NOTE — Telephone Encounter (Signed)
3rd attempt to contact patient in regards to rescheduling CR - lm on vm

## 2018-02-02 ENCOUNTER — Ambulatory Visit (HOSPITAL_COMMUNITY): Payer: Medicaid Other

## 2018-02-05 ENCOUNTER — Ambulatory Visit (HOSPITAL_COMMUNITY): Payer: Medicaid Other

## 2018-02-07 ENCOUNTER — Ambulatory Visit (HOSPITAL_COMMUNITY): Payer: Medicaid Other

## 2018-02-09 ENCOUNTER — Ambulatory Visit (HOSPITAL_COMMUNITY): Payer: Medicaid Other

## 2018-02-12 ENCOUNTER — Ambulatory Visit (HOSPITAL_COMMUNITY): Payer: Medicaid Other

## 2018-02-12 ENCOUNTER — Ambulatory Visit (HOSPITAL_COMMUNITY)
Admission: RE | Admit: 2018-02-12 | Discharge: 2018-02-12 | Disposition: A | Discharge status: Home / Self Care | Payer: Medicaid Other | Source: Ambulatory Visit | Attending: Cardiovascular Disease | Admitting: Cardiovascular Disease

## 2018-02-12 DIAGNOSIS — I739 Peripheral vascular disease, unspecified: Secondary | ICD-10-CM | POA: Diagnosis present

## 2018-02-14 ENCOUNTER — Ambulatory Visit (HOSPITAL_COMMUNITY): Payer: Medicaid Other

## 2018-02-16 ENCOUNTER — Ambulatory Visit (HOSPITAL_COMMUNITY): Payer: Medicaid Other

## 2018-02-19 ENCOUNTER — Telehealth (HOSPITAL_COMMUNITY): Payer: Self-pay | Admitting: *Deleted

## 2018-02-19 ENCOUNTER — Ambulatory Visit (HOSPITAL_COMMUNITY): Payer: Medicaid Other

## 2018-02-19 DIAGNOSIS — I999 Unspecified disorder of circulatory system: Secondary | ICD-10-CM

## 2018-02-19 NOTE — Telephone Encounter (Signed)
-----   Message from Larey Dresser, MD sent at 02/13/2018 10:20 PM EDT ----- Severe vascular disease on the right.  Needs PV appointment scheduled please.

## 2018-02-21 ENCOUNTER — Ambulatory Visit (HOSPITAL_COMMUNITY): Payer: Medicaid Other

## 2018-02-21 ENCOUNTER — Other Ambulatory Visit (HOSPITAL_COMMUNITY): Payer: Self-pay

## 2018-02-21 MED ORDER — SPIRONOLACTONE 25 MG PO TABS: 25.0000 mg | ORAL_TABLET | Freq: Every day | ORAL | 5 refills | Status: DC

## 2018-02-23 ENCOUNTER — Ambulatory Visit (HOSPITAL_COMMUNITY): Payer: Medicaid Other

## 2018-02-26 ENCOUNTER — Ambulatory Visit (HOSPITAL_COMMUNITY): Payer: Medicaid Other

## 2018-02-28 ENCOUNTER — Ambulatory Visit (HOSPITAL_COMMUNITY): Payer: Medicaid Other

## 2018-03-02 ENCOUNTER — Ambulatory Visit (HOSPITAL_COMMUNITY): Payer: Medicaid Other

## 2018-03-05 ENCOUNTER — Ambulatory Visit (HOSPITAL_COMMUNITY): Payer: Medicaid Other

## 2018-03-07 ENCOUNTER — Ambulatory Visit (HOSPITAL_COMMUNITY): Payer: Medicaid Other

## 2018-03-09 ENCOUNTER — Ambulatory Visit (HOSPITAL_COMMUNITY): Payer: Medicaid Other

## 2018-03-12 ENCOUNTER — Ambulatory Visit (HOSPITAL_COMMUNITY): Payer: Medicaid Other

## 2018-03-13 ENCOUNTER — Other Ambulatory Visit: Payer: Self-pay

## 2018-03-13 ENCOUNTER — Encounter: Payer: Self-pay | Admitting: Vascular Surgery

## 2018-03-13 ENCOUNTER — Ambulatory Visit (INDEPENDENT_AMBULATORY_CARE_PROVIDER_SITE_OTHER): Payer: Medicaid Other | Admitting: Vascular Surgery

## 2018-03-13 VITALS — BP 120/70 | HR 62 | Temp 97.3°F | Resp 14 | Ht <= 58 in | Wt 194.0 lb

## 2018-03-13 DIAGNOSIS — I739 Peripheral vascular disease, unspecified: Secondary | ICD-10-CM | POA: Diagnosis not present

## 2018-03-13 NOTE — Progress Notes (Signed)
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 female, who is today for evaluation of severe claudication symptoms in her right leg.  She is a very pleasant female who had undergone prior cardiac surgery for rheumatic heart disease.  She surgery was greater than 10 years ago at [facility].  She had recurrent disease and underwent catheter aortic valve replacement on 11/21/2017 via her right groin.  She reports that following the procedure she noted discomfort in her hip and buttocks and right leg with walking.  She reports that this is makes it difficult for her routine daily activities.  Had no tissue loss and no arterial rest pain.  Has been stable from a cardiac standpoint  Past Medical History:  Diagnosis Date  . Chronic combined systolic and diastolic CHF (congestive heart failure) (HCC)    EF now down to 25-30%. LVEDP on cath was 30 mmHg with wedge pressure of 31 mmHg.  . CKD (chronic kidney disease)   . Dementia ([place])    Kani Jobson onset  . Essential hypertension 12/16/2013  . History of GI bleeding   . Hyperlipidemia with target LDL less than 100 12/16/2013  . Hypothyroidism 12/16/2013  . 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 Morbid obesity ([place])   . 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 Papillary thyroid carcinoma ([place])    [name] 05/10/2016 from [place]  . Post-menopausal bleeding 12/05/2011  . Pulmonary hypertension ([place]) 05/2016   By cardiac catheterization: mean RA 18, RV pressure/EDP: 70/12/18 mmHg.  mean PA 74/39 mean 52, mean PCWP 31 (with V wave of 45 mmHg), LVEDP ~ 30 mmHg.  [name] RAD (reactive airway disease)   . 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   August  2010: a/p AoV & MV repair for severe MR and moderate AI - Dr. Roxy Manns  Echo 12/2010: EF 40-45%, inferior hypokinesis; Slow progression of valvular Dz & drop in EF --> 05/2016: EF 25-30% (down from 40-45% post-op) with Mod AS/AI, mild MS & Severe MR (Cath & TEE 06/2016)   . S/P aortic valve repair 65/68/1275   suture plication of 3 commissures  . S/P MVR (mitral valve repair) 12/23/2008   26 mm Sorin MEMO 3D Ring Annuloplasty  . 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 Valvular cardiomyopathy ([place])    EF progressively worsened from 40-45% down to 25-30% by February 2018. Progressive worsening of aortic and mitral valve disease.    Family History  Problem Relation Age of Onset  . Heart disease Mother   . CVA Mother     SOCIAL HISTORY: Social History   Socioeconomic History  . Marital status: Single    Spouse name: Not on file  . Number of children: Not on file  . Years of education: Not on file  . Highest education level: Not on file  Occupational History  . Not on file  Social Needs  . Financial resource strain: Not on file  . Food insecurity:    Worry: Not on file    Inability: Not on file  . Transportation needs:    Medical: Not on file    Non-medical: Not on  file  Tobacco Use  . Smoking status: Former Smoker    Packs/day: 0.10    Years: 15.00    Pack years: 1.50    Types: Cigarettes    Last attempt to quit: 2011    Years since quitting: 8.8  . Smokeless tobacco: Never Used  Substance and Sexual Activity  . Alcohol use: Never    Frequency: Never  . Drug use: Never  . Sexual activity: Not Currently  Lifestyle  . Physical activity:    Days per week: Not on file    Minutes per session: Not on file  . Stress: Not on file  Relationships  . Social connections:    Talks on phone: Not on file    Gets together: Not on file    Attends religious  service: Not on file    Active member of club or organization: Not on file    Attends meetings of clubs or organizations: Not on file    Relationship status: Not on file  . Intimate partner violence:    Fear of current or ex partner: Not on file    Emotionally abused: Not on file    Physically abused: Not on file    Forced sexual activity: Not on file  Other Topics Concern  . Not on file  Social History Narrative   Unemployed, on disability.   Now is finally reestablished with a PCP.   She is a former smoker, quit in 2011. Denies alcohol consumption   Tries to walk routinely.    Allergies  Allergen Reactions  . Morphine And Related Other (See Comments)    Hallucinations     Current Outpatient Medications  Medication Sig Dispense Refill  . acetaminophen (TYLENOL) 325 MG tablet Take 650 mg every 6 (six) hours as needed by mouth for mild pain.    [name] albuterol (PROVENTIL HFA;VENTOLIN HFA) 108 (90 BASE) MCG/ACT inhaler Inhale 2 puffs into the lungs every 6 (six) hours as needed for shortness of breath.     [name] aspirin 81 MG chewable tablet Chew 1 tablet (81 mg total) by mouth daily. 30 tablet 3  . beclomethasone (QVAR) 40 MCG/ACT inhaler Inhale 2 puffs into the lungs daily as needed (shortness of breath).     . carvedilol (COREG) 12.5 MG tablet Take 0.5 tablets (6.25 mg total) by mouth 2 (two) times daily with a meal. 60 tablet 2  . clopidogrel (PLAVIX) 75 MG tablet Take 1 tablet (75 mg total) by mouth daily with breakfast. 90 tablet 1  . docusate sodium (COLACE) 100 MG capsule Take 1 capsule (100 mg total) by mouth 2 (two) times daily. 60 capsule 3  . furosemide (LASIX) 40 MG tablet Take 1 tablet (40 mg total) by mouth daily. 30 tablet 11  . levothyroxine (SYNTHROID, LEVOTHROID) 125 MCG tablet Take 125 mcg by mouth daily before breakfast.   0  . potassium chloride (K-DUR) 10 MEQ tablet Take 2 tablets (20 mEq total) by mouth daily. 60 tablet 3  . rosuvastatin (CRESTOR) 20 MG tablet TAKE 1  TABLET BY MOUTH ONCE DAILY 90 tablet 0  . sacubitril-valsartan (ENTRESTO) 24-26 MG Take 1 tablet by mouth 2 (two) times daily. 60 tablet 11  . spironolactone (ALDACTONE) 25 MG tablet Take 1 tablet (25 mg total) by mouth daily. 30 tablet 5   No current facility-administered medications for this visit.     REVIEW OF SYSTEMS:  [X]  denotes positive finding, [ ]  denotes negative finding Cardiac  Comments:  Chest pain  or chest pressure:    Shortness of breath upon exertion:    Short of breath when lying flat:    Irregular heart rhythm:        Vascular    Pain in calf, thigh, or hip brought on by ambulation: x   Pain in feet at night that wakes you up from your sleep:     Blood clot in your veins:    Leg swelling:         Pulmonary    Oxygen at home:    Productive cough:     Wheezing:         Neurologic    Sudden weakness in arms or legs:     Sudden numbness in arms or legs:     Sudden onset of difficulty speaking or slurred speech:    Temporary loss of vision in one eye:     Problems with dizziness:         Gastrointestinal    Blood in stool:     Vomited blood:         Genitourinary    Burning when urinating:     Blood in urine:        Psychiatric    Major depression:         Hematologic    Bleeding problems:    Problems with blood clotting too easily:        Skin    Rashes or ulcers:        Constitutional    Fever or chills:      PHYSICAL EXAM: Vitals:   03/13/18 1501  BP: 120/70  Pulse: 62  Resp: 14  Temp: (!) 97.3 F (36.3 C)  TempSrc: Oral  SpO2: 99%  Weight: 194 lb (88 kg)  Height: 4\' 9"  (1.448 m)    GENERAL: The patient is a well-nourished female, in no acute distress. The vital signs are documented above. CARDIOVASCULAR: 2+ radial pulses bilaterally.  I cannot palpate femoral pulses on either side.  She is morbidly obese.  I do not palpate pedal pulses. PULMONARY: There is good air exchange  ABDOMEN: Soft and non-tender  MUSCULOSKELETAL: There  are no major deformities or cyanosis. NEUROLOGIC: No focal weakness or paresthesias are detected. SKIN: There are no ulcers or rashes noted. PSYCHIATRIC: The patient has a normal affect.  DATA:  Noninvasive studies from [place] line on 02/12/2018 reveal occlusion of her external iliac artery and common femoral artery here to have monophasic flow in her deep femoral and superficial femoral arteries.  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  Suspect that this was related to the TAVR procedure.  She is very limited by this.  She does wish to have further evaluation.  I have recommended CT angiogram for better definition of the level of occlusion.  It is highly likely that she would be able to have correction of this through a groin incision only.  Doubt that she would have any endovascular treatment options.  Will proceed with CT angiogram and then see her back for further discussion of this.   Rosetta Posner, MD FACS [facility] Tel [phone] Pager [phone]

## 2018-03-14 ENCOUNTER — Other Ambulatory Visit: Payer: Self-pay

## 2018-03-14 ENCOUNTER — Ambulatory Visit (HOSPITAL_COMMUNITY): Payer: Medicaid Other

## 2018-03-14 DIAGNOSIS — I771 Stricture of artery: Secondary | ICD-10-CM

## 2018-03-16 ENCOUNTER — Ambulatory Visit (HOSPITAL_COMMUNITY): Payer: Medicaid Other

## 2018-03-19 ENCOUNTER — Ambulatory Visit (HOSPITAL_COMMUNITY): Payer: Medicaid Other

## 2018-03-21 ENCOUNTER — Ambulatory Visit (HOSPITAL_COMMUNITY): Payer: Medicaid Other

## 2018-03-23 ENCOUNTER — Ambulatory Visit (HOSPITAL_COMMUNITY): Payer: Medicaid Other

## 2018-03-26 ENCOUNTER — Inpatient Hospital Stay (HOSPITAL_COMMUNITY)
Admission: RE | Admit: 2018-03-26 | Discharge: 2018-03-26 | Disposition: A | Discharge status: Home / Self Care | Payer: Medicaid Other | Source: Ambulatory Visit

## 2018-03-26 ENCOUNTER — Ambulatory Visit (HOSPITAL_COMMUNITY): Payer: Medicaid Other

## 2018-03-28 ENCOUNTER — Ambulatory Visit (HOSPITAL_COMMUNITY): Payer: Medicaid Other

## 2018-03-29 ENCOUNTER — Encounter (HOSPITAL_COMMUNITY): Payer: Self-pay

## 2018-03-29 ENCOUNTER — Ambulatory Visit (HOSPITAL_COMMUNITY)
Admission: RE | Admit: 2018-03-29 | Discharge: 2018-03-29 | Disposition: A | Discharge status: Home / Self Care | Payer: Medicaid Other | Source: Ambulatory Visit | Attending: Internal Medicine | Admitting: Internal Medicine

## 2018-03-29 ENCOUNTER — Other Ambulatory Visit: Payer: Self-pay

## 2018-03-29 VITALS — BP 100/78 | HR 59 | Wt 200.0 lb

## 2018-03-29 DIAGNOSIS — I428 Other cardiomyopathies: Secondary | ICD-10-CM | POA: Diagnosis not present

## 2018-03-29 DIAGNOSIS — Z87891 Personal history of nicotine dependence: Secondary | ICD-10-CM | POA: Insufficient documentation

## 2018-03-29 DIAGNOSIS — I130 Hypertensive heart and chronic kidney disease with heart failure and stage 1 through stage 4 chronic kidney disease, or unspecified chronic kidney disease: Secondary | ICD-10-CM | POA: Diagnosis present

## 2018-03-29 DIAGNOSIS — I739 Peripheral vascular disease, unspecified: Secondary | ICD-10-CM | POA: Diagnosis not present

## 2018-03-29 DIAGNOSIS — R0683 Snoring: Secondary | ICD-10-CM

## 2018-03-29 DIAGNOSIS — E785 Hyperlipidemia, unspecified: Secondary | ICD-10-CM | POA: Diagnosis not present

## 2018-03-29 DIAGNOSIS — Z7982 Long term (current) use of aspirin: Secondary | ICD-10-CM | POA: Insufficient documentation

## 2018-03-29 DIAGNOSIS — E039 Hypothyroidism, unspecified: Secondary | ICD-10-CM | POA: Insufficient documentation

## 2018-03-29 DIAGNOSIS — I5043 Acute on chronic combined systolic (congestive) and diastolic (congestive) heart failure: Secondary | ICD-10-CM

## 2018-03-29 DIAGNOSIS — Z952 Presence of prosthetic heart valve: Secondary | ICD-10-CM

## 2018-03-29 DIAGNOSIS — Z8249 Family history of ischemic heart disease and other diseases of the circulatory system: Secondary | ICD-10-CM | POA: Diagnosis not present

## 2018-03-29 DIAGNOSIS — I38 Endocarditis, valve unspecified: Secondary | ICD-10-CM | POA: Diagnosis not present

## 2018-03-29 DIAGNOSIS — Z79899 Other long term (current) drug therapy: Secondary | ICD-10-CM | POA: Diagnosis not present

## 2018-03-29 DIAGNOSIS — I5042 Chronic combined systolic (congestive) and diastolic (congestive) heart failure: Secondary | ICD-10-CM | POA: Diagnosis not present

## 2018-03-29 DIAGNOSIS — I5022 Chronic systolic (congestive) heart failure: Secondary | ICD-10-CM | POA: Insufficient documentation

## 2018-03-29 DIAGNOSIS — Z79890 Hormone replacement therapy (postmenopausal): Secondary | ICD-10-CM | POA: Diagnosis not present

## 2018-03-29 DIAGNOSIS — N183 Chronic kidney disease, stage 3 (moderate): Secondary | ICD-10-CM | POA: Diagnosis not present

## 2018-03-29 DIAGNOSIS — I999 Unspecified disorder of circulatory system: Secondary | ICD-10-CM | POA: Diagnosis not present

## 2018-03-29 NOTE — Patient Instructions (Signed)
Follow up and echo in 3 months with Dr.Bensimhon.  You have been referred to sleep study they will contact you to schedule appointment.

## 2018-03-29 NOTE — Progress Notes (Signed)
Advanced Heart Failure Clinic Note   PCP: Roe Coombs Cardiology: Dr. Ellyn Hack HF Cardiology: Dr. Aundra Dubin  57 y.o. 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  She had aortic and mitral repairs in 2010.  Afterwards, EF was mildly low in the 40-45% range.  However, EF had dropped to 25-30% on 1/18 echo.  TEE showed severe MR, moderate AI, moderate aortic stenosis.  RHC/LHC in 2/18 showed significantly elevated LV and RV filling pressures.    At her initial appointment here, she was very dyspneic with exertion, with orthopnea and PND. Her meds were adjusted and Lasix increased.  She has seen Dr. Roxy Manns since that time for evaluation for redo valve surgery.  She would require high risk mitral and aortic valve replacements. For now, the plan will be close monitoring with surgery if starts to decline or has CHF exacerbation. CPX was done in 3/18 showing moderate to severely decreased functional capacity.  RHC in 4/18 showed optimized filling pressures but low cardiac output.  Repeat echo in 8/18 showed EF 30-35%, severe AI with moderate AS but only mild MR noted.  The RV was mildly dilated with mildly decreased systolic function.   TEE 06/28/2017: EF 45%, severe AI, mild to moderate AS, mild mitral valve stenosis s/p MV repair  In 7/19, she had TAVR with #23 Edwards Sapien 3 THV.  Echo post-op in 7/19 showed EF 30-35%, moderate LVH, bioprosthetic aortic valve looked ok, s/p MV repair with mild mitral stenosis.   Today she returns for heart failure follow up. Complaining of right high and back pain.  Says her leg aches when she is walking. Having a hard sleeping. Day time fatigue. Denies SOB/PND/Orthopnea. Appetite ok. No fever or chills. Weight at home 194 pounds. Taking all medications. Lives with daughter. Requires assistance with transportation.   Labs (2/18): K 3.8 => 3.4, creatinine 1.09 => 1.07, hgb 12.7, BNP 1010 Labs (3/18):  K 4.6, creatinine 2.43 => 1.74 Labs (4/18): K 4, creatinine 1.28, digoxin 0.3 Labs 09/2016: K 4.4, creatinine 1.26.  Labs 01/19/2017: K 4.4, Creatinine 1.21, digoxin 0.3 Labs (10/18): K 4.7, creatinine 1.10, digoxin 0.3 Labs (12/18): K 4.9, creatinine 1.34, digoxin < 0.2 Labs (8/19): K 3.9, creatinine 1.33 Labs (01/23/2018): K 3.7 Creatinine 1.46.   PMH: 1. HTN 2. Hypothyroidism 3. Hyperlipidemia 4. Rheumatic heart disease: s/p MV repair and aortic valve repair in 2010, Dr. Roxy Manns. - TEE (2/18): EF 35-30%, dilated LV, rheumatic-appearing aortic valve s/p repair with moderate AS (AVA 1.1 cm^2), moderate AI.  MV s/p repair with mild-moderate mitral stenosis and eccentric severe MR.   - Echo (8/18): Severe AI but only moderate AS, MR was only mild on this study (may have missed full jet) with mean MV gradient 4 mmHg.  - TEE 06/28/2017: EF 45%, severe AI, mild to moderate AS, mild mitral valve stenosis s/p MV repair - In 7/19, she had TAVR with #23 Edwards Sapien 3 THV.   - Echo post-op in 7/19 showed EF 30-35%, moderate LVH, bioprosthetic aortic valve looked ok, s/p MV repair with mild mitral stenosis.  5. Chronic systolic CHF: Nonischemic cardiomyopathy, probably related to valvular disease.   - Echo (11/15): EF 40-45%, - Echo (12/16): EF 35-40%. - Echo (1/18): EF 25-30%.  - LHC/RHC (2/18): Normal coronaries, mean RA 18, mean PA 74/39 mean 52, mean PCWP 31, CI 1.98 Fick. - CPX (3/18): peak VO2 11.7, VE/VCO2 slope 40, RER  1.11 => moderate to severely decreased functional capacity due to HF.  - RHC (4/18): mean RA 4, PA 44/17 mean 28, mean PCWP 15, CI 1.73, PVR 4 WU.  - Echo (8/18): EF 30-35%, severe AI with moderate AS but only mild MR noted with mean MV gradient 4 mmHg.  The RV was mildly dilated with mildly decreased systolic function. - TEE 6/07: EF 45%, severe aortic insufficiency, mild to moderate AS, mild mitral valve stenosis s/p MV repair - Coronary CTA (5/19): No obstructive coronary  disease.  - Echo (7/19): EF 30-35%, moderate LVH, bioprosthetic aortic valve looked ok, s/p MV repair with mild mitral stenosis.  6. Carotid dopplers (6/19): No significant disease.   Social History   Socioeconomic History  . Marital status: Single    Spouse name: Not on file  . Number of children: Not on file  . Years of education: Not on file  . Highest education level: Not on file  Occupational History  . Not on file  Social Needs  . Financial resource strain: Not on file  . Food insecurity:    Worry: Not on file    Inability: Not on file  . Transportation needs:    Medical: Not on file    Non-medical: Not on file  Tobacco Use  . Smoking status: Former Smoker    Packs/day: 0.10    Years: 15.00    Pack years: 1.50    Types: Cigarettes    Last attempt to quit: 2011    Years since quitting: 8.8  . Smokeless tobacco: Never Used  Substance and Sexual Activity  . Alcohol use: Never    Frequency: Never  . Drug use: Never  . Sexual activity: Not Currently  Lifestyle  . Physical activity:    Days per week: Not on file    Minutes per session: Not on file  . Stress: Not on file  Relationships  . Social connections:    Talks on phone: Not on file    Gets together: Not on file    Attends religious service: Not on file    Active member of club or organization: Not on file    Attends meetings of clubs or organizations: Not on file    Relationship status: Not on file  . Intimate partner violence:    Fear of current or ex partner: Not on file    Emotionally abused: Not on file    Physically abused: Not on file    Forced sexual activity: Not on file  Other Topics Concern  . Not on file  Social History Narrative   Unemployed, on disability.   Now is finally reestablished with a PCP.   She is a former smoker, quit in 2011. Denies alcohol consumption   Tries to walk routinely.   Family History  Problem Relation Age of Onset  . Heart disease Mother   . CVA Mother     Review of systems complete and found to be negative unless listed in HPI.    Current Outpatient Medications  Medication Sig Dispense Refill  . acetaminophen (TYLENOL) 325 MG tablet Take 650 mg every 6 (six) hours as needed by mouth for mild pain.    [name] albuterol (PROVENTIL HFA;VENTOLIN HFA) 108 (90 BASE) MCG/ACT inhaler Inhale 2 puffs into the lungs every 6 (six) hours as needed for shortness of breath.     [name] aspirin 81 MG chewable tablet Chew 1 tablet (81 mg total) by mouth daily. 30 tablet 3  .  beclomethasone (QVAR) 40 MCG/ACT inhaler Inhale 2 puffs into the lungs daily as needed (shortness of breath).     . carvedilol (COREG) 12.5 MG tablet Take 0.5 tablets (6.25 mg total) by mouth 2 (two) times daily with a meal. 60 tablet 2  . clopidogrel (PLAVIX) 75 MG tablet Take 1 tablet (75 mg total) by mouth daily with breakfast. 90 tablet 1  . docusate sodium (COLACE) 100 MG capsule Take 1 capsule (100 mg total) by mouth 2 (two) times daily. 60 capsule 3  . furosemide (LASIX) 40 MG tablet Take 1 tablet (40 mg total) by mouth daily. 30 tablet 11  . levothyroxine (SYNTHROID, LEVOTHROID) 125 MCG tablet Take 125 mcg by mouth daily before breakfast.   0  . potassium chloride (K-DUR) 10 MEQ tablet Take 2 tablets (20 mEq total) by mouth daily. 60 tablet 3  . rosuvastatin (CRESTOR) 20 MG tablet TAKE 1 TABLET BY MOUTH ONCE DAILY 90 tablet 0  . sacubitril-valsartan (ENTRESTO) 24-26 MG Take 1 tablet by mouth 2 (two) times daily. 60 tablet 11  . spironolactone (ALDACTONE) 25 MG tablet Take 1 tablet (25 mg total) by mouth daily. 30 tablet 5   No current facility-administered medications for this encounter.    Vitals:   03/29/18 1149  BP: 100/78  Pulse: (!) 59  SpO2: 99%  Weight: 90.7 kg (200 lb)    Wt Readings from Last 3 Encounters:  03/29/18 90.7 kg (200 lb)  03/13/18 88 kg (194 lb)  01/23/18 87.8 kg (193 lb 9.6 oz)    Physical Exam  General:  Well appearing. No resp difficulty HEENT:  normal Neck: supple. no JVD. Carotids 2+ bilat; no bruits. No lymphadenopathy or thryomegaly appreciated. Cor: PMI nondisplaced. Regular rate & rhythm. No rubs, gallops. 1/6 SEM RUSB.  Sternal scar.  Lungs: clear Abdomen: soft, nontender, nondistended. No hepatosplenomegaly. No bruits or masses. Good bowel sounds. Extremities: no cyanosis, clubbing, rash, edema Neuro: alert & orientedx3, cranial nerves grossly intact. moves all 4 extremities w/o difficulty. Affect pleasant   Assessment/Plan: 1. 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  She had aortic and mitral valve repairs in 2010.   It was felt that she would not be a very good Mitraclip candidate as she already has a degree of mitral stenosis.  TEE in 2/18 showed moderate AS/moderate AI , mild to moderate MS, severe eccentric MR.  Echo in 8/18 showed moderate AS/severe AI but only mild mitral stenosis and mild MR noted. Hemodynamics looked better on 4/18 RHC though cardiac output still marginal.  Repeat TEE 06/28/2017: EF 45%, severe aortic insufficiency, mild to moderate AS, mild mitral valve stenosis s/p MV repair with trivial MR. She had TAVR in 7/19, feeling much better since then.  Post-op echo showed stable bioprosthetic aortic valve, mild mitral stenosis s/p MV repair.  2. Chronic systolic CHF:  Nonischemic cardiomyopathy (no significant disease on 2/18 LHC).  It is possible that this cardiomyopathy is driven by her valvular disease. Echo (8/18) with EF 30-35% RV mildly dilated. TEE 06/28/2017: EF 45%, severe aortic insufficiency, mild to moderate AS, mild mitral valve stenosis s/p MV repair with only trivial MR. Post-TAVR echo showed EF down to 30-35%.   NYHA I. Volume status stable. Limited by claudication.   - Continue lasix 40 mg daily + 20 meq K.  BMET next visit.  - Continue Entresto 24/26 bid.    - Bidil stopped with hypotension - Continue spironolactone 25 mg daily.  - Continue Coreg 6.25 mg bid. No room  to increase with  bradycardia.  - Repeat ECHO next visit. If EF remains low will refer to EP 3. CKD stage II-III:  Recent bmet next visit.  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  She is followed by Dr Early--> she was set up for CTA 5. Day time fatigue/Snoring Set up sleep study with Dr Radford Pax.   Follow up in 3 months with an ECHO and Dr Aundra Dubin. Plan to check BMET at that time.      Greater than 50% of the (total minutes 25) visit spent in counseling/coordination of care regarding the above.     Darrick Grinder, NP 03/29/2018

## 2018-03-30 ENCOUNTER — Ambulatory Visit (HOSPITAL_COMMUNITY): Payer: Medicaid Other

## 2018-04-02 ENCOUNTER — Ambulatory Visit (HOSPITAL_COMMUNITY): Payer: Medicaid Other

## 2018-04-04 ENCOUNTER — Other Ambulatory Visit (HOSPITAL_COMMUNITY): Payer: Self-pay

## 2018-04-04 ENCOUNTER — Ambulatory Visit (HOSPITAL_COMMUNITY): Payer: Medicaid Other

## 2018-04-04 DIAGNOSIS — I5043 Acute on chronic combined systolic (congestive) and diastolic (congestive) heart failure: Secondary | ICD-10-CM

## 2018-04-04 DIAGNOSIS — I5022 Chronic systolic (congestive) heart failure: Secondary | ICD-10-CM

## 2018-04-04 MED ORDER — POTASSIUM CHLORIDE ER 10 MEQ PO TBCR: 20.0000 meq | EXTENDED_RELEASE_TABLET | Freq: Every day | ORAL | 3 refills | Status: DC

## 2018-04-05 ENCOUNTER — Ambulatory Visit
Admission: RE | Admit: 2018-04-05 | Discharge: 2018-04-05 | Disposition: A | Discharge status: Home / Self Care | Payer: Medicaid Other | Source: Ambulatory Visit | Attending: Vascular Surgery | Admitting: Vascular Surgery

## 2018-04-05 DIAGNOSIS — I771 Stricture of artery: Secondary | ICD-10-CM

## 2018-04-05 MED ORDER — IOPAMIDOL (ISOVUE-370) INJECTION 76%
75.0000 mL | Freq: Once | INTRAVENOUS | Status: AC | PRN
  Administered 2018-04-05: 75 mL via INTRAVENOUS

## 2018-04-06 ENCOUNTER — Ambulatory Visit (HOSPITAL_COMMUNITY): Payer: Medicaid Other

## 2018-04-09 ENCOUNTER — Ambulatory Visit (HOSPITAL_COMMUNITY): Payer: Medicaid Other

## 2018-04-10 ENCOUNTER — Encounter: Payer: Self-pay | Admitting: Vascular Surgery

## 2018-04-10 ENCOUNTER — Ambulatory Visit (INDEPENDENT_AMBULATORY_CARE_PROVIDER_SITE_OTHER): Payer: Medicaid Other | Admitting: Vascular Surgery

## 2018-04-10 ENCOUNTER — Other Ambulatory Visit: Payer: Self-pay

## 2018-04-10 VITALS — BP 88/60 | HR 64 | Resp 16 | Ht <= 58 in | Wt 202.0 lb

## 2018-04-10 DIAGNOSIS — I739 Peripheral vascular disease, unspecified: Secondary | ICD-10-CM

## 2018-04-10 NOTE — Progress Notes (Signed)
[facility]  Patient name: [name] MRN: [id number] DOB: 04/25/1961 Sex: female  REASON FOR VISIT: Follow-up CT scan for evaluation of right lower extremity arterial insufficiency  HPI: [name] is a 57 y.o. female here today for follow-up.  I had seen her several weeks ago.  She had what appeared to be occlusion of her external iliac artery on the right that was most likely related to injury at the time of her TAVR.  Today she reports her main discomfort is low back discomfort.  She does have clear-cut right lower extremity claudication but no rest pain.  Past Medical History:  Diagnosis Date  . Chronic combined systolic and diastolic CHF (congestive heart failure) (HCC)    EF now down to 25-30%. LVEDP on cath was 30 mmHg with wedge pressure of 31 mmHg.  . CKD (chronic kidney disease)   . Dementia ([place])    [name] onset  . Essential hypertension 12/16/2013  . History of GI bleeding   . Hyperlipidemia with target LDL less than 100 12/16/2013  . Hypothyroidism 12/16/2013  . 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 Morbid obesity ([place])   . 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 Papillary thyroid carcinoma ([place])    [name] 05/10/2016 from [place]  . Post-menopausal bleeding 12/05/2011  . Pulmonary hypertension ([place]) 05/2016   By cardiac catheterization: mean RA 18, RV pressure/EDP: 70/12/18 mmHg.  mean PA 74/39 mean 52, mean PCWP 31 (with V wave of 45 mmHg), LVEDP ~ 30 mmHg.  [name] RAD (reactive airway disease)   . 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 Roxy Manns  Echo 12/2010: EF 40-45%, inferior hypokinesis; Slow progression of valvular Dz & drop in EF --> 05/2016: EF 25-30% (down from 40-45% post-op) with Mod AS/AI, mild MS & Severe MR (Cath & TEE 06/2016)   . S/P aortic valve repair  62/37/6283   suture plication of 3 commissures  . S/P MVR (mitral valve repair) 12/23/2008   26 mm Sorin MEMO 3D Ring Annuloplasty  . 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 Valvular cardiomyopathy ([place])    EF progressively worsened from 40-45% down to 25-30% by February 2018. Progressive worsening of aortic and mitral valve disease.    Family History  Problem Relation Age of Onset  . Heart disease Mother   . CVA Mother     SOCIAL HISTORY: Social History   Tobacco Use  . Smoking status: Former Smoker    Packs/day: 0.10    Years: 15.00    Pack years: 1.50    Types: Cigarettes    Last attempt to quit: 2011    Years since quitting: 8.8  . Smokeless tobacco: Never Used  Substance Use Topics  . Alcohol use: Never    Frequency: Never    Allergies  Allergen Reactions  . Morphine And Related Other (See Comments)    Hallucinations     Current Outpatient Medications  Medication Sig Dispense Refill  . acetaminophen (TYLENOL) 325 MG tablet Take 650 mg every 6 (six) hours as needed by mouth for mild pain.    [name] albuterol (PROVENTIL HFA;VENTOLIN HFA) 108 (90 BASE) MCG/ACT inhaler Inhale 2 puffs into the lungs every 6 (six) hours as needed  for shortness of breath.     [name] aspirin 81 MG chewable tablet Chew 1 tablet (81 mg total) by mouth daily. 30 tablet 3  . beclomethasone (QVAR) 40 MCG/ACT inhaler Inhale 2 puffs into the lungs daily as needed (shortness of breath).     . carvedilol (COREG) 12.5 MG tablet Take 0.5 tablets (6.25 mg total) by mouth 2 (two) times daily with a meal. 60 tablet 2  . clopidogrel (PLAVIX) 75 MG tablet Take 1 tablet (75 mg total) by mouth daily with breakfast. 90 tablet 1  . docusate sodium (COLACE) 100 MG capsule Take 1 capsule (100 mg total) by mouth 2 (two) times daily. 60 capsule 3  . furosemide (LASIX) 40 MG tablet Take 1 tablet (40  mg total) by mouth daily. 30 tablet 11  . levothyroxine (SYNTHROID, LEVOTHROID) 125 MCG tablet Take 125 mcg by mouth daily before breakfast.   0  . potassium chloride (K-DUR) 10 MEQ tablet Take 2 tablets (20 mEq total) by mouth daily. 60 tablet 3  . rosuvastatin (CRESTOR) 20 MG tablet TAKE 1 TABLET BY MOUTH ONCE DAILY 90 tablet 0  . sacubitril-valsartan (ENTRESTO) 24-26 MG Take 1 tablet by mouth 2 (two) times daily. 60 tablet 11  . spironolactone (ALDACTONE) 25 MG tablet Take 1 tablet (25 mg total) by mouth daily. 30 tablet 5   No current facility-administered medications for this visit.     REVIEW OF SYSTEMS:  [X]  denotes positive finding, [ ]  denotes negative finding Cardiac  Comments:  Chest pain or chest pressure:    Shortness of breath upon exertion:    Short of breath when lying flat:    Irregular heart rhythm:        Vascular    Pain in calf, thigh, or hip brought on by ambulation: x   Pain in feet at night that wakes you up from your sleep:     Blood clot in your veins:    Leg swelling:           PHYSICAL EXAM: Vitals:   04/10/18 1031  BP: (!) 88/60  Pulse: 64  Resp: 16  SpO2: 100%  Weight: 202 lb (91.6 kg)  Height: 4\' 9"  (1.448 m)    GENERAL: The patient is a well-nourished female, in no acute distress. The vital signs are documented above. CARDIOVASCULAR: Absent right pedal pulses PULMONARY: There is good air exchange  MUSCULOSKELETAL: There are no major deformities or cyanosis. NEUROLOGIC: No focal weakness or paresthesias are detected. SKIN: There are no ulcers or rashes noted. PSYCHIATRIC: The patient has a normal affect.  DATA:  CT scan shows very small aorta and iliac vessels with a negative right iliac vessel of less than 5 mm.  She does have complete occlusion of her external iliac just above the inguinal ligament and has reconstitution of her superficial femoral and profundus femoris arteries.  MEDICAL ISSUES: I discussed the significance of this  with patient.  I explained that this could be corrected with thrombectomy and endarterectomy and probable patch of her right distal external iliac and common femoral artery.  Also explained that it would be perfectly safe to live with this level of claudication if it is not limiting her.  She reports that she is capable to do her routine activities and wishes for observation.  I feel this is appropriate.  We will see her again in 6 months for continued discussion and she will notify should this become more of a burden in the meantime  Rosetta Posner, MD FACS [facility] Tel [phone] Pager [phone]

## 2018-04-11 ENCOUNTER — Ambulatory Visit (HOSPITAL_COMMUNITY): Payer: Medicaid Other

## 2018-04-13 ENCOUNTER — Ambulatory Visit (HOSPITAL_COMMUNITY): Payer: Medicaid Other

## 2018-04-16 ENCOUNTER — Ambulatory Visit (HOSPITAL_COMMUNITY): Payer: Medicaid Other

## 2018-04-18 ENCOUNTER — Ambulatory Visit (HOSPITAL_COMMUNITY): Payer: Medicaid Other

## 2018-04-20 ENCOUNTER — Other Ambulatory Visit (HOSPITAL_COMMUNITY): Payer: Self-pay | Admitting: Student

## 2018-04-20 ENCOUNTER — Ambulatory Visit (HOSPITAL_COMMUNITY): Payer: Medicaid Other

## 2018-04-20 ENCOUNTER — Other Ambulatory Visit: Payer: Self-pay | Admitting: Cardiology

## 2018-04-23 ENCOUNTER — Ambulatory Visit (HOSPITAL_COMMUNITY): Payer: Medicaid Other

## 2018-04-24 ENCOUNTER — Ambulatory Visit: Payer: Medicaid Other | Admitting: Vascular Surgery

## 2018-04-25 ENCOUNTER — Ambulatory Visit (HOSPITAL_COMMUNITY): Payer: Medicaid Other

## 2018-04-27 ENCOUNTER — Ambulatory Visit (HOSPITAL_COMMUNITY): Payer: Medicaid Other

## 2018-04-30 ENCOUNTER — Ambulatory Visit (HOSPITAL_COMMUNITY): Payer: Medicaid Other

## 2018-05-01 ENCOUNTER — Ambulatory Visit: Payer: Medicaid Other | Admitting: Vascular Surgery

## 2018-06-27 ENCOUNTER — Ambulatory Visit (HOSPITAL_BASED_OUTPATIENT_CLINIC_OR_DEPARTMENT_OTHER)
Admission: RE | Admit: 2018-06-27 | Discharge: 2018-06-27 | Disposition: A | Discharge status: Home / Self Care | Payer: Medicaid Other | Source: Ambulatory Visit | Attending: Internal Medicine | Admitting: Internal Medicine

## 2018-06-27 ENCOUNTER — Encounter (HOSPITAL_COMMUNITY): Payer: Self-pay | Admitting: Cardiology

## 2018-06-27 ENCOUNTER — Ambulatory Visit (HOSPITAL_COMMUNITY)
Admission: RE | Admit: 2018-06-27 | Discharge: 2018-06-27 | Disposition: A | Discharge status: Home / Self Care | Payer: Medicaid Other | Source: Ambulatory Visit | Attending: Internal Medicine | Admitting: Internal Medicine

## 2018-06-27 VITALS — BP 108/60 | HR 64 | Wt 203.4 lb

## 2018-06-27 DIAGNOSIS — E785 Hyperlipidemia, unspecified: Secondary | ICD-10-CM | POA: Diagnosis not present

## 2018-06-27 DIAGNOSIS — I5022 Chronic systolic (congestive) heart failure: Secondary | ICD-10-CM | POA: Insufficient documentation

## 2018-06-27 DIAGNOSIS — N183 Chronic kidney disease, stage 3 (moderate): Secondary | ICD-10-CM | POA: Insufficient documentation

## 2018-06-27 DIAGNOSIS — Z79899 Other long term (current) drug therapy: Secondary | ICD-10-CM | POA: Diagnosis not present

## 2018-06-27 DIAGNOSIS — I130 Hypertensive heart and chronic kidney disease with heart failure and stage 1 through stage 4 chronic kidney disease, or unspecified chronic kidney disease: Secondary | ICD-10-CM | POA: Insufficient documentation

## 2018-06-27 DIAGNOSIS — Z7902 Long term (current) use of antithrombotics/antiplatelets: Secondary | ICD-10-CM | POA: Insufficient documentation

## 2018-06-27 DIAGNOSIS — E039 Hypothyroidism, unspecified: Secondary | ICD-10-CM | POA: Insufficient documentation

## 2018-06-27 DIAGNOSIS — I5042 Chronic combined systolic (congestive) and diastolic (congestive) heart failure: Secondary | ICD-10-CM

## 2018-06-27 DIAGNOSIS — I080 Rheumatic disorders of both mitral and aortic valves: Secondary | ICD-10-CM | POA: Insufficient documentation

## 2018-06-27 DIAGNOSIS — Z7982 Long term (current) use of aspirin: Secondary | ICD-10-CM | POA: Insufficient documentation

## 2018-06-27 DIAGNOSIS — I5043 Acute on chronic combined systolic (congestive) and diastolic (congestive) heart failure: Secondary | ICD-10-CM | POA: Diagnosis not present

## 2018-06-27 DIAGNOSIS — I739 Peripheral vascular disease, unspecified: Secondary | ICD-10-CM | POA: Insufficient documentation

## 2018-06-27 DIAGNOSIS — Z87891 Personal history of nicotine dependence: Secondary | ICD-10-CM | POA: Insufficient documentation

## 2018-06-27 DIAGNOSIS — I428 Other cardiomyopathies: Secondary | ICD-10-CM | POA: Insufficient documentation

## 2018-06-27 DIAGNOSIS — Z8249 Family history of ischemic heart disease and other diseases of the circulatory system: Secondary | ICD-10-CM | POA: Diagnosis not present

## 2018-06-27 DIAGNOSIS — R0683 Snoring: Secondary | ICD-10-CM | POA: Diagnosis not present

## 2018-06-27 DIAGNOSIS — Z952 Presence of prosthetic heart valve: Secondary | ICD-10-CM

## 2018-06-27 LAB — LIPID PANEL
Cholesterol: 108 mg/dL (ref 0–200)
HDL: 40 mg/dL — AB (ref >40)
LDL Cholesterol: 50 mg/dL (ref 0–99)
Total CHOL/HDL Ratio: 2.7 RATIO
Triglycerides: 89 mg/dL (ref <150)
VLDL: 18 mg/dL (ref 0–40)

## 2018-06-27 LAB — BASIC METABOLIC PANEL
ANION GAP: 11 (ref 5–15)
BUN: 15 mg/dL (ref 6–20)
CO2: 27 mmol/L (ref 22–32)
Calcium: 9.1 mg/dL (ref 8.9–10.3)
Chloride: 102 mmol/L (ref 98–111)
Creatinine, Ser: 1.63 mg/dL — ABNORMAL HIGH (ref 0.44–1.00)
GFR calc non Af Amer: 35 mL/min — ABNORMAL LOW (ref >60)
GFR, EST AFRICAN AMERICAN: 40 mL/min — AB (ref >60)
Glucose, Bld: 116 mg/dL — ABNORMAL HIGH (ref 70–99)
Potassium: 4.1 mmol/L (ref 3.5–5.1)
Sodium: 140 mmol/L (ref 135–145)

## 2018-06-27 LAB — CBC
HCT: 42.8 % (ref 36.0–46.0)
HEMOGLOBIN: 13 g/dL (ref 12.0–15.0)
MCH: 28.3 pg (ref 26.0–34.0)
MCHC: 30.4 g/dL (ref 30.0–36.0)
MCV: 93 fL (ref 80.0–100.0)
NRBC: 0 % (ref 0.0–0.2)
Platelets: 154 10*3/uL (ref 150–400)
RBC: 4.6 MIL/uL (ref 3.87–5.11)
RDW: 14.8 % (ref 11.5–15.5)
WBC: 7.6 10*3/uL (ref 4.0–10.5)

## 2018-06-27 MED ORDER — CARVEDILOL 6.25 MG PO TABS: 9.3750 mg | ORAL_TABLET | Freq: Two times a day (BID) | ORAL | 11 refills | Status: DC

## 2018-06-27 MED ORDER — POTASSIUM CHLORIDE ER 10 MEQ PO TBCR: 40.0000 meq | EXTENDED_RELEASE_TABLET | Freq: Every day | ORAL | 3 refills | Status: DC

## 2018-06-27 MED ORDER — FUROSEMIDE 40 MG PO TABS: ORAL_TABLET | ORAL | 11 refills | Status: DC

## 2018-06-27 NOTE — Patient Instructions (Signed)
INCREASE Lasix to 40mg  in the AM and 20mg  in the PM.  INCREASE Potassium to 36meq daily.  INCREASE Carvedilol to 9.375mg  twice daily.  STOP Plavix.   Your provider requests you have a sleep study.  Labs today. We will call if results are ABNORMAL.  Repeat labs in 10days.  Follow up in 1 month

## 2018-06-27 NOTE — Progress Notes (Signed)
  Echocardiogram 2D Echocardiogram has been performed.  [name] 06/27/2018, 10:43 AM

## 2018-06-27 NOTE — Progress Notes (Signed)
Advanced Heart Failure Clinic Note   PCP: Roe Coombs Cardiology: Dr. Ellyn Hack HF Cardiology: Dr. Aundra Dubin  58 y.o. 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  She had aortic and mitral repairs in 2010.  Afterwards, EF was mildly low in the 40-45% range.  However, EF had dropped to 25-30% on 1/18 echo.  TEE showed severe MR, moderate AI, moderate aortic stenosis.  RHC/LHC in 2/18 showed significantly elevated LV and RV filling pressures.    At her initial appointment here, she was very dyspneic with exertion, with orthopnea and PND. Her meds were adjusted and Lasix increased.  She has seen Dr. Roxy Manns since that time for evaluation for redo valve surgery.  She would require high risk mitral and aortic valve replacements. For now, the plan will be close monitoring with surgery if starts to decline or has CHF exacerbation. CPX was done in 3/18 showing moderate to severely decreased functional capacity.  RHC in 4/18 showed optimized filling pressures but low cardiac output.  Repeat echo in 8/18 showed EF 30-35%, severe AI with moderate AS but only mild MR noted.  The RV was mildly dilated with mildly decreased systolic function.   TEE 06/28/2017: EF 45%, severe AI, mild to moderate AS, mild mitral valve stenosis s/p MV repair  In 7/19, she had TAVR with #23 Edwards Sapien 3 THV.  Echo post-op in 7/19 showed EF 30-35%, moderate LVH, bioprosthetic aortic valve looked ok, s/p MV repair with mild mitral stenosis.   She developed pain in her right leg and was found to have occluded right EIA on dopplers in 9/19, confirmed by CTA. Suspect this was a vascular access complication of TAVR.  She was seen by Dr. Donnetta Hutching, conservative treatment for now.   Echo was done today and reviewed.  EF remains 30-35%, stable TAVR valve, s/p MVR repair with no regurgitation or significant stenosis.   She returns for followup of CHF and aortic valve  disease.  She is not short of breath walking on flat ground or up a flight of stairs.  However, she feels like her abdomen is tight. No orthopnea/PND.  No lightheadedness.  No chest pain. Weight is up 3 lbs.  She denies significant claudication.   Labs (2/18): K 3.8 => 3.4, creatinine 1.09 => 1.07, hgb 12.7, BNP 1010 Labs (3/18): K 4.6, creatinine 2.43 => 1.74 Labs (4/18): K 4, creatinine 1.28, digoxin 0.3 Labs 09/2016: K 4.4, creatinine 1.26.  Labs 01/19/2017: K 4.4, Creatinine 1.21, digoxin 0.3 Labs (10/18): K 4.7, creatinine 1.10, digoxin 0.3 Labs (12/18): K 4.9, creatinine 1.34, digoxin < 0.2 Labs (8/19): K 3.9, creatinine 1.33 Labs (9/19): K 3.7, creatinine 1.46  ECG (personally reviewed): NSR, 1st degree AVB, PVCs, IVCD, inferior Qs  PMH: 1. HTN 2. Hypothyroidism 3. Hyperlipidemia 4. Rheumatic heart disease: s/p MV repair and aortic valve repair in 2010, Dr. Roxy Manns. - TEE (2/18): EF 35-30%, dilated LV, rheumatic-appearing aortic valve s/p repair with moderate AS (AVA 1.1 cm^2), moderate AI.  MV s/p repair with mild-moderate mitral stenosis and eccentric severe MR.   - Echo (8/18): Severe AI but only moderate AS, MR was only mild on this study (may have missed full jet) with mean MV gradient 4 mmHg.  - TEE 06/28/2017: EF 45%, severe AI, mild to moderate AS, mild mitral valve stenosis s/p MV repair - In 7/19, she had TAVR with #23 Edwards Sapien 3 THV.   -  Echo post-op in 7/19 showed EF 30-35%, moderate LVH, bioprosthetic aortic valve looked ok, s/p MV repair with mild mitral stenosis.  5. Chronic systolic CHF: Nonischemic cardiomyopathy, probably related to valvular disease.   - Echo (11/15): EF 40-45%, - Echo (12/16): EF 35-40%. - Echo (1/18): EF 25-30%.  - LHC/RHC (2/18): Normal coronaries, mean RA 18, mean PA 74/39 mean 52, mean PCWP 31, CI 1.98 Fick. - CPX (3/18): peak VO2 11.7, VE/VCO2 slope 40, RER 1.11 => moderate to severely decreased functional capacity due to HF.  - RHC (4/18):  mean RA 4, PA 44/17 mean 28, mean PCWP 15, CI 1.73, PVR 4 WU.  - Echo (8/18): EF 30-35%, severe AI with moderate AS but only mild MR noted with mean MV gradient 4 mmHg.  The RV was mildly dilated with mildly decreased systolic function. - TEE 3/29: EF 45%, severe aortic insufficiency, mild to moderate AS, mild mitral valve stenosis s/p MV repair - Coronary CTA (5/19): No obstructive coronary disease.  - Echo (7/19): EF 30-35%, moderate LVH, bioprosthetic aortic valve looked ok, s/p MV repair with mild mitral stenosis.  - Echo (2/20): EF 30-35%, moderate LVH with diffuse hypokinesis, mildly decreased RV systolic function, bioprosthetic aortic valve s/p TAVR without significant stenosis or regurgitation, s/p MV repair with mean gradient 3 mmHg and no MR.  6. Carotid dopplers (6/19): No significant disease.  7. PAD: 9/19 peripheral arterial dopplers showed total occluded proximal right external iliac artery. - CTA confirmed total occlusion of R EIA with collaterals.  Suspect TAVR vascular access complication.   Social History   Socioeconomic History  . Marital status: Single    Spouse name: Not on file  . Number of children: Not on file  . Years of education: Not on file  . Highest education level: Not on file  Occupational History  . Not on file  Social Needs  . Financial resource strain: Not on file  . Food insecurity:    Worry: Not on file    Inability: Not on file  . Transportation needs:    Medical: Not on file    Non-medical: Not on file  Tobacco Use  . Smoking status: Former Smoker    Packs/day: 0.10    Years: 15.00    Pack years: 1.50    Types: Cigarettes    Last attempt to quit: 2011    Years since quitting: 9.1  . Smokeless tobacco: Never Used  Substance and Sexual Activity  . Alcohol use: Never    Frequency: Never  . Drug use: Never  . Sexual activity: Not Currently  Lifestyle  . Physical activity:    Days per week: Not on file    Minutes per session: Not on file    . Stress: Not on file  Relationships  . Social connections:    Talks on phone: Not on file    Gets together: Not on file    Attends religious service: Not on file    Active member of club or organization: Not on file    Attends meetings of clubs or organizations: Not on file    Relationship status: Not on file  . Intimate partner violence:    Fear of current or ex partner: Not on file    Emotionally abused: Not on file    Physically abused: Not on file    Forced sexual activity: Not on file  Other Topics Concern  . Not on file  Social History Narrative   Unemployed, on disability.  Now is finally reestablished with a PCP.   She is a former smoker, quit in 2011. Denies alcohol consumption   Tries to walk routinely.   Family History  Problem Relation Age of Onset  . Heart disease Mother   . CVA Mother    Review of systems complete and found to be negative unless listed in HPI.    Current Outpatient Medications  Medication Sig Dispense Refill  . acetaminophen (TYLENOL) 325 MG tablet Take 650 mg every 6 (six) hours as needed by mouth for mild pain.    [name] albuterol (PROVENTIL HFA;VENTOLIN HFA) 108 (90 BASE) MCG/ACT inhaler Inhale 2 puffs into the lungs every 6 (six) hours as needed for shortness of breath.     [name] aspirin 81 MG chewable tablet Chew 1 tablet (81 mg total) by mouth daily. 30 tablet 3  . beclomethasone (QVAR) 40 MCG/ACT inhaler Inhale 2 puffs into the lungs daily as needed (shortness of breath).     . docusate sodium (COLACE) 100 MG capsule Take 1 capsule (100 mg total) by mouth 2 (two) times daily. 60 capsule 3  . furosemide (LASIX) 40 MG tablet Take 40mg  in the AM and 20mg  in the PM 45 tablet 11  . levothyroxine (SYNTHROID, LEVOTHROID) 125 MCG tablet Take 125 mcg by mouth daily before breakfast.   0  . potassium chloride (K-DUR) 10 MEQ tablet Take 4 tablets (40 mEq total) by mouth daily. 120 tablet 3  . rosuvastatin (CRESTOR) 20 MG tablet TAKE 1 TABLET BY MOUTH EVERY  DAY 90 tablet 0  . sacubitril-valsartan (ENTRESTO) 24-26 MG Take 1 tablet by mouth 2 (two) times daily. 60 tablet 11  . spironolactone (ALDACTONE) 25 MG tablet Take 1 tablet (25 mg total) by mouth daily. 30 tablet 5  . carvedilol (COREG) 6.25 MG tablet Take 1.5 tablets (9.375 mg total) by mouth 2 (two) times daily. 90 tablet 11   No current facility-administered medications for this encounter.    Vitals:   06/27/18 1119  BP: 108/60  Pulse: 64  SpO2: 96%  Weight: 92.3 kg (203 lb 6.4 oz)    Wt Readings from Last 3 Encounters:  06/27/18 92.3 kg (203 lb 6.4 oz)  04/10/18 91.6 kg (202 lb)  03/29/18 90.7 kg (200 lb)   Orthostatics:  Sitting: 86/62 Standing: 84/60  Physical Exam  General: NAD Neck: JVP difficult, no thyromegaly or thyroid nodule.  Lungs: Clear to auscultation bilaterally with normal respiratory effort. CV: Nondisplaced PMI.  Heart regular S1/S2, no S3/S4, 2/6 early SEM RUSB.  No peripheral edema.  No carotid bruit.  Normal pedal pulses.  Abdomen: Soft, nontender, no hepatosplenomegaly, no distention.  Skin: Intact without lesions or rashes.  Neurologic: Alert and oriented x 3.  Psych: Normal affect. Extremities: No clubbing or cyanosis.  HEENT: Normal.   Assessment/Plan: 1. 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  She had aortic and mitral valve repairs in 2010.   It was felt that she would not be a very good Mitraclip candidate as she already has a degree of mitral stenosis.  TEE in 2/18 showed moderate AS/moderate AI , mild to moderate MS, severe eccentric MR.  Echo in 8/18 showed moderate AS/severe AI but only mild mitral stenosis and mild MR noted. Hemodynamics looked better on 4/18 RHC though cardiac output still marginal.  Repeat TEE 06/28/2017: EF 45%, severe aortic insufficiency, mild to moderate AS, mild mitral valve stenosis s/p MV repair with trivial MR. She had TAVR in 7/19, feeling much better since then.  Echo today showed stable bioprosthetic aortic  valve, s/p mitral valve repair without significant stenosis or regurgitation.  - She will need antibiotics with dental work.  - At this point, she can stop Plavix (started after TAVR, >6 months ago) and continue ASA 81 daily.  2. Chronic systolic CHF:  Nonischemic cardiomyopathy (no significant disease on 2/18 LHC).  It is possible that this cardiomyopathy is driven by her valvular disease. Echo (8/18) with EF 30-35% RV mildly dilated. TEE 06/28/2017: EF 45%, severe aortic insufficiency, mild to moderate AS, mild mitral valve stenosis s/p MV repair with only trivial MR. Post-TAVR echo showed EF down to 30-35%, and echo done today shows EF stable in 30-35% range.  This may reflect myocardial dysfunction from prior long-standing aortic insufficiency.  NYHA class II symptoms.  Exam is difficult for volume, but she feels "swollen."    - Increase Lasix to 40 qam/20 qpm, increase KCl to 40 mEq daily. BMET today and again in 10 days.   - Continue Entresto 24/26 bid.    - Bidil stopped with hypotension - Continue spironolactone 25 mg daily.  - Increase Coreg to 9.375 mg bid.   - I will refer to cardiac rehab.  - EF persistently low.  I will refer her to EP for ICD consideration.  She will not be a candidate for CRT.  3. CKD stage II-III:  - BMET today.  4. PAD: Occluded right EIA likely due to TAVR vascular access complication.  Really having minimal right leg claudication (had collaterals on CTA).    - Followup as scheduled with Dr. Donnetta Hutching.  5. Hyperlipidemia: On Crestor.  - Lipids today.  6. Suspect OSA: I will schedule a sleep study.   Followup in 1 month with NP/PA to reassess volume.   Colleen Champagne, MD 06/27/2018

## 2018-07-04 ENCOUNTER — Telehealth (HOSPITAL_COMMUNITY): Payer: Self-pay

## 2018-07-04 NOTE — Telephone Encounter (Signed)
Patients insurance is active through [place]. [facility]#[id number]  Will fax over [facility] Reimbursement form to Dr. Aundra Dubin.

## 2018-07-05 ENCOUNTER — Telehealth (HOSPITAL_COMMUNITY): Payer: Self-pay

## 2018-07-05 NOTE — Telephone Encounter (Signed)
Cardiac Rehab phase 2 form faxed with DM signature

## 2018-07-09 ENCOUNTER — Telehealth (HOSPITAL_COMMUNITY): Payer: Self-pay

## 2018-07-09 ENCOUNTER — Ambulatory Visit (HOSPITAL_COMMUNITY)
Admission: RE | Admit: 2018-07-09 | Discharge: 2018-07-09 | Disposition: A | Discharge status: Home / Self Care | Payer: Medicaid Other | Source: Ambulatory Visit | Attending: Internal Medicine | Admitting: Internal Medicine

## 2018-07-09 DIAGNOSIS — I5022 Chronic systolic (congestive) heart failure: Secondary | ICD-10-CM | POA: Diagnosis present

## 2018-07-09 LAB — BASIC METABOLIC PANEL
Anion gap: 13 (ref 5–15)
BUN: 28 mg/dL — ABNORMAL HIGH (ref 6–20)
CO2: 24 mmol/L (ref 22–32)
Calcium: 8.8 mg/dL — ABNORMAL LOW (ref 8.9–10.3)
Chloride: 98 mmol/L (ref 98–111)
Creatinine, Ser: 1.87 mg/dL — ABNORMAL HIGH (ref 0.44–1.00)
GFR calc Af Amer: 34 mL/min — ABNORMAL LOW (ref >60)
GFR, EST NON AFRICAN AMERICAN: 30 mL/min — AB (ref >60)
Glucose, Bld: 138 mg/dL — ABNORMAL HIGH (ref 70–99)
Potassium: 4 mmol/L (ref 3.5–5.1)
Sodium: 135 mmol/L (ref 135–145)

## 2018-07-09 NOTE — Telephone Encounter (Signed)
-----   Message from Larey Dresser, MD sent at 07/09/2018  2:41 PM EST ----- No changes for now but would repeat BMET in 1 week to make sure not trending up.

## 2018-07-09 NOTE — Telephone Encounter (Signed)
Creatinine 1.87,  Pt aware of results. Per MD, recommends repeat lab work in 1 week.  Pt states understanding and amenable to plan.  appt made for 2/24

## 2018-07-09 NOTE — Telephone Encounter (Signed)
Attempted to call patient in regards to Cardiac Rehab - LM on VM °Gloria W. Support Rep II °

## 2018-07-16 ENCOUNTER — Ambulatory Visit (HOSPITAL_COMMUNITY)
Admission: RE | Admit: 2018-07-16 | Discharge: 2018-07-16 | Disposition: A | Discharge status: Home / Self Care | Payer: Medicaid Other | Source: Ambulatory Visit | Attending: Cardiology | Admitting: Cardiology

## 2018-07-16 DIAGNOSIS — I5022 Chronic systolic (congestive) heart failure: Secondary | ICD-10-CM | POA: Diagnosis present

## 2018-07-16 LAB — BASIC METABOLIC PANEL
Anion gap: 13 (ref 5–15)
BUN: 41 mg/dL — ABNORMAL HIGH (ref 6–20)
CALCIUM: 9.4 mg/dL (ref 8.9–10.3)
CO2: 24 mmol/L (ref 22–32)
Chloride: 96 mmol/L — ABNORMAL LOW (ref 98–111)
Creatinine, Ser: 2.17 mg/dL — ABNORMAL HIGH (ref 0.44–1.00)
GFR calc Af Amer: 28 mL/min — ABNORMAL LOW (ref >60)
GFR calc non Af Amer: 24 mL/min — ABNORMAL LOW (ref >60)
Glucose, Bld: 105 mg/dL — ABNORMAL HIGH (ref 70–99)
Potassium: 4.4 mmol/L (ref 3.5–5.1)
Sodium: 133 mmol/L — ABNORMAL LOW (ref 135–145)

## 2018-07-21 ENCOUNTER — Other Ambulatory Visit (HOSPITAL_COMMUNITY): Payer: Self-pay | Admitting: Cardiology

## 2018-07-26 ENCOUNTER — Ambulatory Visit (HOSPITAL_COMMUNITY)
Admission: RE | Admit: 2018-07-26 | Discharge: 2018-07-26 | Disposition: A | Discharge status: Home / Self Care | Payer: Medicaid Other | Source: Ambulatory Visit | Attending: Internal Medicine | Admitting: Internal Medicine

## 2018-07-26 VITALS — BP 148/90 | HR 67 | Wt 205.4 lb

## 2018-07-26 DIAGNOSIS — N183 Chronic kidney disease, stage 3 (moderate): Secondary | ICD-10-CM | POA: Insufficient documentation

## 2018-07-26 DIAGNOSIS — Z87891 Personal history of nicotine dependence: Secondary | ICD-10-CM | POA: Insufficient documentation

## 2018-07-26 DIAGNOSIS — Z952 Presence of prosthetic heart valve: Secondary | ICD-10-CM

## 2018-07-26 DIAGNOSIS — I130 Hypertensive heart and chronic kidney disease with heart failure and stage 1 through stage 4 chronic kidney disease, or unspecified chronic kidney disease: Secondary | ICD-10-CM | POA: Insufficient documentation

## 2018-07-26 DIAGNOSIS — E039 Hypothyroidism, unspecified: Secondary | ICD-10-CM | POA: Diagnosis not present

## 2018-07-26 DIAGNOSIS — I428 Other cardiomyopathies: Secondary | ICD-10-CM | POA: Insufficient documentation

## 2018-07-26 DIAGNOSIS — R0683 Snoring: Secondary | ICD-10-CM

## 2018-07-26 DIAGNOSIS — Z7982 Long term (current) use of aspirin: Secondary | ICD-10-CM | POA: Insufficient documentation

## 2018-07-26 DIAGNOSIS — I999 Unspecified disorder of circulatory system: Secondary | ICD-10-CM

## 2018-07-26 DIAGNOSIS — Z79899 Other long term (current) drug therapy: Secondary | ICD-10-CM | POA: Diagnosis not present

## 2018-07-26 DIAGNOSIS — I5022 Chronic systolic (congestive) heart failure: Secondary | ICD-10-CM

## 2018-07-26 DIAGNOSIS — I5042 Chronic combined systolic (congestive) and diastolic (congestive) heart failure: Secondary | ICD-10-CM

## 2018-07-26 DIAGNOSIS — E785 Hyperlipidemia, unspecified: Secondary | ICD-10-CM | POA: Diagnosis not present

## 2018-07-26 DIAGNOSIS — I080 Rheumatic disorders of both mitral and aortic valves: Secondary | ICD-10-CM | POA: Insufficient documentation

## 2018-07-26 DIAGNOSIS — R5383 Other fatigue: Secondary | ICD-10-CM | POA: Diagnosis not present

## 2018-07-26 DIAGNOSIS — Z8249 Family history of ischemic heart disease and other diseases of the circulatory system: Secondary | ICD-10-CM | POA: Diagnosis not present

## 2018-07-26 DIAGNOSIS — Z7901 Long term (current) use of anticoagulants: Secondary | ICD-10-CM | POA: Insufficient documentation

## 2018-07-26 DIAGNOSIS — I739 Peripheral vascular disease, unspecified: Secondary | ICD-10-CM | POA: Diagnosis not present

## 2018-07-26 LAB — BASIC METABOLIC PANEL
Anion gap: 11 (ref 5–15)
BUN: 28 mg/dL — ABNORMAL HIGH (ref 6–20)
CO2: 24 mmol/L (ref 22–32)
Calcium: 9.1 mg/dL (ref 8.9–10.3)
Chloride: 102 mmol/L (ref 98–111)
Creatinine, Ser: 1.71 mg/dL — ABNORMAL HIGH (ref 0.44–1.00)
GFR calc Af Amer: 38 mL/min — ABNORMAL LOW (ref >60)
GFR calc non Af Amer: 33 mL/min — ABNORMAL LOW (ref >60)
Glucose, Bld: 101 mg/dL — ABNORMAL HIGH (ref 70–99)
Potassium: 4.5 mmol/L (ref 3.5–5.1)
Sodium: 137 mmol/L (ref 135–145)

## 2018-07-26 MED ORDER — CARVEDILOL 6.25 MG PO TABS: 6.2500 mg | ORAL_TABLET | Freq: Two times a day (BID) | ORAL | 11 refills | Status: DC

## 2018-07-26 MED ORDER — ROSUVASTATIN CALCIUM 20 MG PO TABS: 20.0000 mg | ORAL_TABLET | Freq: Every day | ORAL | 1 refills | Status: DC

## 2018-07-26 NOTE — Progress Notes (Signed)
CSW consulted to meet with pt regarding transportation needs for upcoming sleep study- study to take place Sunday, March 22nd at 8pm.  Pt normally takes Medicaid transport to all medical appts but CSW called and they are unable to take patient to appt at that time of night but could pick pt up the next morning.  Pt states she does not have anyone who she thinks can give her a ride as they either work, don't have a car, or would require payment for a ride.  CSW encouraged pt to ask for help getting to appt in case someones schedule is open on that day.  CSW called [employer] and left message to see if they provide transport on weekends/evenings.    CSW will continue to evaluate transportation options for patient.  Pt was tearful during interview and admits to being exhausted with health issues.  Reports that she is depressed at this time and that she has had issues with depression in the past and was previously on medication which she reports helped.  CSW helped pt make PCP appt on 3/10 at 9:30am to discuss restarting depression medication and emailed pt list of local therapists that accept medicaid.  CSW will continue to follow and assist as needed  Jorge Ny, LCSW Clinical Social Worker [facility] [phone]

## 2018-07-26 NOTE — Progress Notes (Signed)
Advanced Heart Failure Clinic Note   PCP: Roe Coombs Cardiology: Dr. Ellyn Hack HF Cardiology: Dr. Candace Cruise is a 58 y.o. female  with history of rheumatic heart disease s/p aortic and mitral valve repairs in 2010 presents for followup of CHF.  She had aortic and mitral repairs in 2010.  Afterwards, EF was mildly low in the 40-45% range.  However, EF had dropped to 25-30% on 1/18 echo.  TEE showed severe MR, moderate AI, moderate aortic stenosis.  RHC/LHC in 2/18 showed significantly elevated LV and RV filling pressures.    At her initial appointment here, she was very dyspneic with exertion, with orthopnea and PND. Her meds were adjusted and Lasix increased.  She has seen Dr. Roxy Manns since that time for evaluation for redo valve surgery.  She would require high risk mitral and aortic valve replacements. For now, the plan will be close monitoring with surgery if starts to decline or has CHF exacerbation. CPX was done in 3/18 showing moderate to severely decreased functional capacity.  RHC in 4/18 showed optimized filling pressures but low cardiac output.  Repeat echo in 8/18 showed EF 30-35%, severe AI with moderate AS but only mild MR noted.  The RV was mildly dilated with mildly decreased systolic function.   TEE 06/28/2017: EF 45%, severe AI, mild to moderate AS, mild mitral valve stenosis s/p MV repair  In 7/19, she had TAVR with #23 Edwards Sapien 3 THV.  Echo post-op in 7/19 showed EF 30-35%, moderate LVH, bioprosthetic aortic valve looked ok, s/p MV repair with mild mitral stenosis.   She developed pain in her right leg and was found to have occluded right EIA on dopplers in 9/19, confirmed by CTA. Suspect this was a vascular access complication of TAVR.  She was seen by Dr. Donnetta Hutching, conservative treatment for now.   Echo 06/27/18 with EF remains 30-35%, stable TAVR valve, s/p MVR repair with no regurgitation or significant stenosis.   She presents today for follow up. Last visit  referred to EP and lasix increased. Subsequently came back down with AKI. She denies SOB with ADLs, but main complaint is profound fatigue. She says she can walk to her mailbox, but then is "desparate" to sit down when she gets back to the house. She says it took 3 hours to make a pot of spaghetti the other night because she had to keep resting. Her weight is up 2 lbs since last visit. She denies significant claudication. She is taking all medication as directed. She has sleep study scheduled for 3/22, but is not sure she can get transportation.   Labs (2/18): K 3.8 => 3.4, creatinine 1.09 => 1.07, hgb 12.7, BNP 1010 Labs (3/18): K 4.6, creatinine 2.43 => 1.74 Labs (4/18): K 4, creatinine 1.28, digoxin 0.3 Labs 09/2016: K 4.4, creatinine 1.26.  Labs 01/19/2017: K 4.4, Creatinine 1.21, digoxin 0.3 Labs (10/18): K 4.7, creatinine 1.10, digoxin 0.3 Labs (12/18): K 4.9, creatinine 1.34, digoxin < 0.2 Labs (8/19): K 3.9, creatinine 1.33 Labs (9/19): K 3.7, creatinine 1.46  ECG (personally reviewed): NSR, 1st degree AVB, PVCs, IVCD, inferior Qs  PMH: 1. HTN 2. Hypothyroidism 3. Hyperlipidemia 4. Rheumatic heart disease: s/p MV repair and aortic valve repair in 2010, Dr. Roxy Manns. - TEE (2/18): EF 35-30%, dilated LV, rheumatic-appearing aortic valve s/p repair with moderate AS (AVA 1.1 cm^2), moderate AI.  MV s/p repair with mild-moderate mitral stenosis and eccentric severe MR.   - Echo (8/18): Severe AI  but only moderate AS, MR was only mild on this study (may have missed full jet) with mean MV gradient 4 mmHg.  - TEE 06/28/2017: EF 45%, severe AI, mild to moderate AS, mild mitral valve stenosis s/p MV repair - In 7/19, she had TAVR with #23 Edwards Sapien 3 THV.   - Echo post-op in 7/19 showed EF 30-35%, moderate LVH, bioprosthetic aortic valve looked ok, s/p MV repair with mild mitral stenosis.  5. Chronic systolic CHF: Nonischemic cardiomyopathy, probably related to valvular disease.   - Echo (11/15):  EF 40-45%, - Echo (12/16): EF 35-40%. - Echo (1/18): EF 25-30%.  - LHC/RHC (2/18): Normal coronaries, mean RA 18, mean PA 74/39 mean 52, mean PCWP 31, CI 1.98 Fick. - CPX (3/18): peak VO2 11.7, VE/VCO2 slope 40, RER 1.11 => moderate to severely decreased functional capacity due to HF.  - RHC (4/18): mean RA 4, PA 44/17 mean 28, mean PCWP 15, CI 1.73, PVR 4 WU.  - Echo (8/18): EF 30-35%, severe AI with moderate AS but only mild MR noted with mean MV gradient 4 mmHg.  The RV was mildly dilated with mildly decreased systolic function. - TEE 3/30: EF 45%, severe aortic insufficiency, mild to moderate AS, mild mitral valve stenosis s/p MV repair - Coronary CTA (5/19): No obstructive coronary disease.  - Echo (7/19): EF 30-35%, moderate LVH, bioprosthetic aortic valve looked ok, s/p MV repair with mild mitral stenosis.  - Echo (2/20): EF 30-35%, moderate LVH with diffuse hypokinesis, mildly decreased RV systolic function, bioprosthetic aortic valve s/p TAVR without significant stenosis or regurgitation, s/p MV repair with mean gradient 3 mmHg and no MR.  6. Carotid dopplers (6/19): No significant disease.  7. PAD: 9/19 peripheral arterial dopplers showed total occluded proximal right external iliac artery. - CTA confirmed total occlusion of R EIA with collaterals.  Suspect TAVR vascular access complication.   Review of systems complete and found to be negative unless listed in HPI.    Social History   Socioeconomic History  . Marital status: Single    Spouse name: Not on file  . Number of children: Not on file  . Years of education: Not on file  . Highest education level: Not on file  Occupational History  . Not on file  Social Needs  . Financial resource strain: Not on file  . Food insecurity:    Worry: Not on file    Inability: Not on file  . Transportation needs:    Medical: Not on file    Non-medical: Not on file  Tobacco Use  . Smoking status: Former Smoker    Packs/day: 0.10     Years: 15.00    Pack years: 1.50    Types: Cigarettes    Last attempt to quit: 2011    Years since quitting: 9.1  . Smokeless tobacco: Never Used  Substance and Sexual Activity  . Alcohol use: Never    Frequency: Never  . Drug use: Never  . Sexual activity: Not Currently  Lifestyle  . Physical activity:    Days per week: Not on file    Minutes per session: Not on file  . Stress: Not on file  Relationships  . Social connections:    Talks on phone: Not on file    Gets together: Not on file    Attends religious service: Not on file    Active member of club or organization: Not on file    Attends meetings of clubs or organizations:  Not on file    Relationship status: Not on file  . Intimate partner violence:    Fear of current or ex partner: Not on file    Emotionally abused: Not on file    Physically abused: Not on file    Forced sexual activity: Not on file  Other Topics Concern  . Not on file  Social History Narrative   Unemployed, on disability.   Now is finally reestablished with a PCP.   She is a former smoker, quit in 2011. Denies alcohol consumption   Tries to walk routinely.   Family History  Problem Relation Age of Onset  . Heart disease Mother   . CVA Mother     Current Outpatient Medications  Medication Sig Dispense Refill  . acetaminophen (TYLENOL) 325 MG tablet Take 650 mg every 6 (six) hours as needed by mouth for mild pain.    [name] albuterol (PROVENTIL HFA;VENTOLIN HFA) 108 (90 BASE) MCG/ACT inhaler Inhale 2 puffs into the lungs every 6 (six) hours as needed for shortness of breath.     [name] aspirin 81 MG chewable tablet Chew 1 tablet (81 mg total) by mouth daily. 30 tablet 3  . beclomethasone (QVAR) 40 MCG/ACT inhaler Inhale 2 puffs into the lungs daily as needed (shortness of breath).     . carvedilol (COREG) 6.25 MG tablet Take 1.5 tablets (9.375 mg total) by mouth 2 (two) times daily. 90 tablet 11  . docusate sodium (COLACE) 100 MG capsule Take 1 capsule  (100 mg total) by mouth 2 (two) times daily. 60 capsule 3  . furosemide (LASIX) 40 MG tablet Take 40mg  in the AM and 20mg  in the PM 45 tablet 11  . levothyroxine (SYNTHROID, LEVOTHROID) 125 MCG tablet Take 125 mcg by mouth daily before breakfast.   0  . potassium chloride (K-DUR) 10 MEQ tablet Take 4 tablets (40 mEq total) by mouth daily. 120 tablet 3  . rosuvastatin (CRESTOR) 20 MG tablet TAKE 1 TABLET BY MOUTH EVERY DAY 90 tablet 1  . sacubitril-valsartan (ENTRESTO) 24-26 MG Take 1 tablet by mouth 2 (two) times daily. 60 tablet 11  . spironolactone (ALDACTONE) 25 MG tablet Take 1 tablet (25 mg total) by mouth daily. 30 tablet 5   No current facility-administered medications for this encounter.    Vitals:   07/26/18 1329  BP: (!) 148/90  Pulse: 67  SpO2: 100%  Weight: 93.2 kg (205 lb 6.4 oz)    Wt Readings from Last 3 Encounters:  07/26/18 93.2 kg (205 lb 6.4 oz)  06/27/18 92.3 kg (203 lb 6.4 oz)  04/10/18 91.6 kg (202 lb)   Physical Exam  General: Obese. NAD.  HEENT: Normal Neck: Supple. JVP difficult, but does NOT appear elevated. Carotids 2+ bilat; no bruits. No thyromegaly or nodule noted. Cor: PMI nondisplaced. RRR, 2/6 early SEM RUSB.  Lungs: CTAB, normal effort. Abdomen: Soft, non-tender, non-distended, no HSM. No bruits or masses. +BS  Extremities: No cyanosis, clubbing, or rash. R and LLE no edema.  Neuro: Alert & orientedx3, cranial nerves grossly intact. moves all 4 extremities w/o difficulty. Affect pleasant   Assessment/Plan: 1. 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  She had aortic and mitral valve repairs in 2010.   It was felt that she would not be a very good Mitraclip candidate as she already has a degree of mitral stenosis.  TEE in 2/18 showed moderate AS/moderate AI , mild to moderate MS, severe eccentric MR.  Echo in 8/18 showed moderate AS/severe AI  but only mild mitral stenosis and mild MR noted. Hemodynamics looked better on 4/18 RHC though cardiac  output still marginal.  Repeat TEE 06/28/2017: EF 45%, severe aortic insufficiency, mild to moderate AS, mild mitral valve stenosis s/p MV repair with trivial MR. She had TAVR in 7/19, feeling much better since then.  Echo 06/27/18 stable bioprosthetic aortic valve, s/p mitral valve repair without significant stenosis or regurgitation.  - She will need antibiotics with dental work.  - Continue ASA 81 daily.  - Now off plavix.  2. Chronic systolic CHF:  Nonischemic cardiomyopathy (no significant disease on 2/18 LHC).  It is possible that this cardiomyopathy is driven by her valvular disease. Echo (8/18) with EF 30-35% RV mildly dilated. TEE 06/28/2017: EF 45%, severe aortic insufficiency, mild to moderate AS, mild mitral valve stenosis s/p MV repair with only trivial MR. Post-TAVR echo showed EF down to 30-35%, and Echo 06/27/18 shows EF stable in 30-35% range.  This may reflect myocardial dysfunction from prior long-standing aortic insufficiency.  NYHA class III-IIIb, confounded by fatigue and deconditioning. Volume status does not appear elevated.  - Continue Lasix 40 mg daily. BMET today.  - Continue Entresto 24/26 mg BID. .    - Bidil stopped with hypotension - Continue spironolactone 25 mg daily.  - Continue Coreg 9.375 mg BID - Referred to cardiac rehab.  - EF persistently low.  IHas been referred to EP for ICD consideration.  She will not be a candidate for CRT.  3. CKD stage III:  - BMET today.  4. PAD: Occluded right EIA likely due to TAVR vascular access complication.  Really having minimal right leg claudication (had collaterals on CTA).    - Sees Dr. Donnetta Hutching.  5. Hyperlipidemia: On Crestor.  - Lipids today.  6. Suspect OSA: - Ordered for sleep study. Scheduled for 08/12/18.  RTC 6-8 weeks. Sooner with symptoms. BMET today. Will have SW work with and try to arrange transportation for sleep study. It is of the upmost importance that she attends.   Shirley Friar, PA-C 07/26/2018    Greater than 50% of the 25 minute visit was spent in counseling/coordination of care regarding disease state education, salt/fluid restriction, sliding scale diuretics, and medication compliance.

## 2018-07-26 NOTE — Patient Instructions (Signed)
Lab work done today. We will notify you of any abnormal lab work.  DECREASE Carvedilol 6.25mg  (1 tab) twice daily  Follow up with the Advanced Practice Provider in 6-8 weeks.

## 2018-07-30 ENCOUNTER — Encounter (HOSPITAL_COMMUNITY): Payer: Self-pay

## 2018-07-30 NOTE — Telephone Encounter (Signed)
Attempted to call pt a 2nd time- LM ON VM ° °Mailed letter out °

## 2018-07-31 ENCOUNTER — Encounter: Payer: Self-pay | Admitting: Cardiology

## 2018-07-31 DIAGNOSIS — I739 Peripheral vascular disease, unspecified: Secondary | ICD-10-CM | POA: Insufficient documentation

## 2018-08-09 ENCOUNTER — Telehealth (HOSPITAL_COMMUNITY): Payer: Self-pay

## 2018-08-09 NOTE — Telephone Encounter (Signed)
Attempted to call patient in regards to Cardiac Rehab - to let pt know we are closed at this time due to the COVID-19 and will contact once we have resume scheduling.  °LMTCB °

## 2018-08-12 ENCOUNTER — Encounter (HOSPITAL_BASED_OUTPATIENT_CLINIC_OR_DEPARTMENT_OTHER): Payer: Medicaid Other

## 2018-09-05 ENCOUNTER — Telehealth: Payer: Self-pay

## 2018-09-05 NOTE — Telephone Encounter (Signed)
**Note De-Identified  Obfuscation** This pt sees Dr Aundra Dubin in our [facility] clinic. Will forward this message to that office.   Key: [name] DOB: January 10, 1961  Entresto 24-26mg    Received from [facility]: Telephone: [phone] Fax: [phone]

## 2018-09-06 ENCOUNTER — Other Ambulatory Visit (HOSPITAL_COMMUNITY): Payer: Self-pay | Admitting: *Deleted

## 2018-09-06 ENCOUNTER — Encounter (HOSPITAL_COMMUNITY): Payer: Medicaid Other

## 2018-09-06 MED ORDER — SACUBITRIL-VALSARTAN 24-26 MG PO TABS: 1.0000 | ORAL_TABLET | Freq: Two times a day (BID) | ORAL | 11 refills | Status: AC

## 2018-09-06 NOTE — Telephone Encounter (Signed)
Follow up:   [name] from [employer] need reauthorization. There is a form tht needs to completed [phone].

## 2018-09-07 NOTE — Telephone Encounter (Signed)
Prior authorization through [employer] was initiated for [employer] medication and sent via [employer] on 09/07/2018.

## 2018-09-13 NOTE — Telephone Encounter (Signed)
Prior authorization through [employer] was APPROVED for [name] and will expire on 09/02/19.

## 2018-09-21 ENCOUNTER — Other Ambulatory Visit (HOSPITAL_COMMUNITY): Payer: Self-pay | Admitting: Cardiology

## 2018-09-24 ENCOUNTER — Encounter (HOSPITAL_BASED_OUTPATIENT_CLINIC_OR_DEPARTMENT_OTHER): Payer: Medicaid Other

## 2018-09-27 ENCOUNTER — Telehealth: Payer: Self-pay | Admitting: Physician Assistant

## 2018-09-27 NOTE — Telephone Encounter (Signed)
lmom for patient to call me back , needs to set up yearly appt with Nell Range  And echo , needs to obtain consent for virtual appt as well

## 2018-10-08 NOTE — Telephone Encounter (Signed)
Left message for patient to call back regarding 1 year check up with K thompson , needs persmission to set up virtual visit, obtain consent and set up echo

## 2018-10-10 ENCOUNTER — Other Ambulatory Visit: Payer: Self-pay

## 2018-10-10 ENCOUNTER — Telehealth: Payer: Self-pay

## 2018-10-10 DIAGNOSIS — Z952 Presence of prosthetic heart valve: Secondary | ICD-10-CM

## 2018-10-10 NOTE — Telephone Encounter (Signed)
LM for pt to call back to schedule her 1 yr TAVR Echo and f/u appt with KT in August.

## 2018-12-06 ENCOUNTER — Other Ambulatory Visit (HOSPITAL_COMMUNITY): Payer: Self-pay | Admitting: Internal Medicine

## 2018-12-06 DIAGNOSIS — I5022 Chronic systolic (congestive) heart failure: Secondary | ICD-10-CM

## 2019-02-15 ENCOUNTER — Encounter (HOSPITAL_COMMUNITY): Payer: Self-pay | Admitting: Physician Assistant

## 2019-02-25 ENCOUNTER — Telehealth (HOSPITAL_COMMUNITY): Payer: Self-pay

## 2019-02-25 NOTE — Telephone Encounter (Signed)
New message    Just an FYI. We have made several attempts to contact this patient including sending a letter to schedule or reschedule their echocardiogram. We will be removing the patient from the echo [place].   9.29.20 @ 10:05am lm on home vm - [name]   9.25.20 @ 9:14am lm on home vm / mail reminder letter [name]  8.24.20 @ 2:19pm lm on home vm [name]  7.24.20 @ 2:45pm lm on home vm - [name]

## 2019-05-06 ENCOUNTER — Other Ambulatory Visit (HOSPITAL_COMMUNITY): Payer: Self-pay

## 2019-05-06 DIAGNOSIS — I5022 Chronic systolic (congestive) heart failure: Secondary | ICD-10-CM

## 2019-05-06 MED ORDER — SPIRONOLACTONE 25 MG PO TABS: ORAL_TABLET | ORAL | 5 refills | Status: DC

## 2019-05-06 MED ORDER — POTASSIUM CHLORIDE ER 10 MEQ PO TBCR: EXTENDED_RELEASE_TABLET | ORAL | 3 refills | Status: DC

## 2019-07-24 ENCOUNTER — Other Ambulatory Visit (HOSPITAL_COMMUNITY): Payer: Self-pay

## 2019-07-24 MED ORDER — ROSUVASTATIN CALCIUM 20 MG PO TABS: 20.0000 mg | ORAL_TABLET | Freq: Every day | ORAL | 0 refills | Status: DC

## 2019-08-07 ENCOUNTER — Other Ambulatory Visit (HOSPITAL_COMMUNITY): Payer: Self-pay

## 2019-08-07 MED ORDER — CARVEDILOL 6.25 MG PO TABS: 6.2500 mg | ORAL_TABLET | Freq: Two times a day (BID) | ORAL | 2 refills | Status: DC

## 2019-08-20 ENCOUNTER — Other Ambulatory Visit (HOSPITAL_COMMUNITY): Payer: Self-pay | Admitting: *Deleted

## 2019-08-20 MED ORDER — FUROSEMIDE 40 MG PO TABS: ORAL_TABLET | ORAL | 11 refills | Status: DC

## 2019-08-26 ENCOUNTER — Other Ambulatory Visit (HOSPITAL_COMMUNITY): Payer: Self-pay

## 2019-08-26 MED ORDER — ROSUVASTATIN CALCIUM 20 MG PO TABS: 20.0000 mg | ORAL_TABLET | Freq: Every day | ORAL | 0 refills | Status: DC

## 2019-09-23 ENCOUNTER — Other Ambulatory Visit (HOSPITAL_COMMUNITY): Payer: Self-pay

## 2019-09-23 DIAGNOSIS — I5022 Chronic systolic (congestive) heart failure: Secondary | ICD-10-CM

## 2019-09-23 MED ORDER — POTASSIUM CHLORIDE ER 10 MEQ PO TBCR: EXTENDED_RELEASE_TABLET | ORAL | 0 refills | Status: DC

## 2019-09-26 ENCOUNTER — Telehealth (HOSPITAL_COMMUNITY): Payer: Self-pay | Admitting: *Deleted

## 2019-09-26 NOTE — Telephone Encounter (Signed)
Pt called to make a f/u appt.I called her back no answer/left vm.

## 2019-09-27 ENCOUNTER — Other Ambulatory Visit (HOSPITAL_COMMUNITY): Payer: Self-pay

## 2019-09-27 MED ORDER — ENTRESTO 24-26 MG PO TABS: 1.0000 | ORAL_TABLET | Freq: Two times a day (BID) | ORAL | 0 refills | Status: DC

## 2019-09-27 MED ORDER — ROSUVASTATIN CALCIUM 20 MG PO TABS: 20.0000 mg | ORAL_TABLET | Freq: Every day | ORAL | 0 refills | Status: DC

## 2019-09-30 ENCOUNTER — Encounter (HOSPITAL_COMMUNITY): Payer: Self-pay

## 2019-09-30 ENCOUNTER — Other Ambulatory Visit: Payer: Self-pay

## 2019-09-30 ENCOUNTER — Telehealth (HOSPITAL_COMMUNITY): Payer: Self-pay | Admitting: Pharmacist

## 2019-09-30 ENCOUNTER — Ambulatory Visit (HOSPITAL_COMMUNITY)
Admission: RE | Admit: 2019-09-30 | Discharge: 2019-09-30 | Disposition: A | Discharge status: Home / Self Care | Payer: Medicaid Other | Source: Ambulatory Visit | Attending: Cardiology | Admitting: Cardiology

## 2019-09-30 VITALS — BP 128/80 | HR 65 | Wt 208.4 lb

## 2019-09-30 DIAGNOSIS — Z7951 Long term (current) use of inhaled steroids: Secondary | ICD-10-CM | POA: Diagnosis not present

## 2019-09-30 DIAGNOSIS — E785 Hyperlipidemia, unspecified: Secondary | ICD-10-CM | POA: Diagnosis not present

## 2019-09-30 DIAGNOSIS — I5022 Chronic systolic (congestive) heart failure: Secondary | ICD-10-CM | POA: Diagnosis present

## 2019-09-30 DIAGNOSIS — Z87891 Personal history of nicotine dependence: Secondary | ICD-10-CM | POA: Diagnosis not present

## 2019-09-30 DIAGNOSIS — I130 Hypertensive heart and chronic kidney disease with heart failure and stage 1 through stage 4 chronic kidney disease, or unspecified chronic kidney disease: Secondary | ICD-10-CM | POA: Diagnosis not present

## 2019-09-30 DIAGNOSIS — I428 Other cardiomyopathies: Secondary | ICD-10-CM | POA: Insufficient documentation

## 2019-09-30 DIAGNOSIS — E039 Hypothyroidism, unspecified: Secondary | ICD-10-CM | POA: Diagnosis not present

## 2019-09-30 DIAGNOSIS — Z953 Presence of xenogenic heart valve: Secondary | ICD-10-CM | POA: Insufficient documentation

## 2019-09-30 DIAGNOSIS — Z79899 Other long term (current) drug therapy: Secondary | ICD-10-CM | POA: Diagnosis not present

## 2019-09-30 DIAGNOSIS — Z7982 Long term (current) use of aspirin: Secondary | ICD-10-CM | POA: Insufficient documentation

## 2019-09-30 DIAGNOSIS — Z79890 Hormone replacement therapy (postmenopausal): Secondary | ICD-10-CM | POA: Diagnosis not present

## 2019-09-30 DIAGNOSIS — N183 Chronic kidney disease, stage 3 unspecified: Secondary | ICD-10-CM | POA: Insufficient documentation

## 2019-09-30 DIAGNOSIS — Z8249 Family history of ischemic heart disease and other diseases of the circulatory system: Secondary | ICD-10-CM | POA: Insufficient documentation

## 2019-09-30 DIAGNOSIS — Z823 Family history of stroke: Secondary | ICD-10-CM | POA: Diagnosis not present

## 2019-09-30 DIAGNOSIS — I099 Rheumatic heart disease, unspecified: Secondary | ICD-10-CM | POA: Diagnosis not present

## 2019-09-30 LAB — COMPREHENSIVE METABOLIC PANEL
ALT: 15 U/L (ref 0–44)
AST: 19 U/L (ref 15–41)
Albumin: 4 g/dL (ref 3.5–5.0)
Alkaline Phosphatase: 94 U/L (ref 38–126)
Anion gap: 10 (ref 5–15)
BUN: 18 mg/dL (ref 6–20)
CO2: 24 mmol/L (ref 22–32)
Calcium: 9 mg/dL (ref 8.9–10.3)
Chloride: 104 mmol/L (ref 98–111)
Creatinine, Ser: 1.24 mg/dL — ABNORMAL HIGH (ref 0.44–1.00)
GFR calc Af Amer: 55 mL/min — ABNORMAL LOW (ref >60)
GFR calc non Af Amer: 48 mL/min — ABNORMAL LOW (ref >60)
Glucose, Bld: 107 mg/dL — ABNORMAL HIGH (ref 70–99)
Potassium: 4.3 mmol/L (ref 3.5–5.1)
Sodium: 138 mmol/L (ref 135–145)
Total Bilirubin: 0.5 mg/dL (ref 0.3–1.2)
Total Protein: 7 g/dL (ref 6.5–8.1)

## 2019-09-30 LAB — LIPID PANEL
Cholesterol: 100 mg/dL (ref 0–200)
HDL: 39 mg/dL — ABNORMAL LOW (ref >40)
LDL Cholesterol: 47 mg/dL (ref 0–99)
Total CHOL/HDL Ratio: 2.6 RATIO
Triglycerides: 68 mg/dL (ref <150)
VLDL: 14 mg/dL (ref 0–40)

## 2019-09-30 LAB — CBC
HCT: 41.8 % (ref 36.0–46.0)
Hemoglobin: 13.3 g/dL (ref 12.0–15.0)
MCH: 28.9 pg (ref 26.0–34.0)
MCHC: 31.8 g/dL (ref 30.0–36.0)
MCV: 90.9 fL (ref 80.0–100.0)
Platelets: 147 10*3/uL — ABNORMAL LOW (ref 150–400)
RBC: 4.6 MIL/uL (ref 3.87–5.11)
RDW: 14.6 % (ref 11.5–15.5)
WBC: 6.6 10*3/uL (ref 4.0–10.5)
nRBC: 0 % (ref 0.0–0.2)

## 2019-09-30 MED ORDER — ROSUVASTATIN CALCIUM 20 MG PO TABS: 20.0000 mg | ORAL_TABLET | Freq: Every day | ORAL | 2 refills | Status: DC

## 2019-09-30 MED ORDER — ENTRESTO 24-26 MG PO TABS: 1.0000 | ORAL_TABLET | Freq: Two times a day (BID) | ORAL | 2 refills | Status: DC

## 2019-09-30 NOTE — Progress Notes (Signed)
Advanced Heart Failure Clinic Note   PCP: Roe Coombs Cardiology: Dr. Ellyn Hack HF Cardiology: Dr. Candace Cruise is a 59 y.o. female  with history of rheumatic heart disease s/p aortic and mitral valve repairs in 2010 presents for followup of CHF.  She had aortic and mitral repairs in 2010.  Afterwards, EF was mildly low in the 40-45% range.  However, EF had dropped to 25-30% on 1/18 echo.  TEE showed severe MR, moderate AI, moderate aortic stenosis.  RHC/LHC in 2/18 showed significantly elevated LV and RV filling pressures.    At her initial appointment here, she was very dyspneic with exertion, with orthopnea and PND. Her meds were adjusted and Lasix increased.  She has seen Dr. Roxy Manns since that time for evaluation for redo valve surgery.  She would require high risk mitral and aortic valve replacements. For now, the plan will be close monitoring with surgery if starts to decline or has CHF exacerbation. CPX was done in 3/18 showing moderate to severely decreased functional capacity.  RHC in 4/18 showed optimized filling pressures but low cardiac output.  Repeat echo in 8/18 showed EF 30-35%, severe AI with moderate AS but only mild MR noted.  The RV was mildly dilated with mildly decreased systolic function.   TEE 06/28/2017: EF 45%, severe AI, mild to moderate AS, mild mitral valve stenosis s/p MV repair  In 7/19, she had TAVR with #23 Edwards Sapien 3 THV.  Echo post-op in 7/19 showed EF 30-35%, moderate LVH, bioprosthetic aortic valve looked ok, s/p MV repair with mild mitral stenosis.   She developed pain in her right leg and was found to have occluded right EIA on dopplers in 9/19, confirmed by CTA. Suspect this was a vascular access complication of TAVR.  She was seen by Dr. Donnetta Hutching, conservative treatment recommended.   Echo 06/27/18 with EF remains 30-35%, stable TAVR valve, s/p MVR repair with no regurgitation or significant stenosis.   She presents to clinic today for f/u. It  has been more than a year since her last visit. Last seen 07/2018. At that visit, referral to EP was placed for ICD consideration and she was also ordered to get a sleep study to assess for OSA, however she did not follow through with orders due to the [place] pandemic.  She feels that she has been doing well from a cardiac standpoint. She denies dyspnea at rest and w/ exertion. No orthopnea/ PND or LEE. Her weight has remained fairly stable over the last year, just up 3 lb from last visit 3/20. No CP. BP well controlled. Has been compliant with all of her meds except Entresto and Crestor. She ran out of refills for both 1 week ago but was fully compliant before running out. BP today 128/80.   Labs (2/18): K 3.8 => 3.4, creatinine 1.09 => 1.07, hgb 12.7, BNP 1010 Labs (3/18): K 4.6, creatinine 2.43 => 1.74 Labs (4/18): K 4, creatinine 1.28, digoxin 0.3 Labs 09/2016: K 4.4, creatinine 1.26.  Labs 01/19/2017: K 4.4, Creatinine 1.21, digoxin 0.3 Labs (10/18): K 4.7, creatinine 1.10, digoxin 0.3 Labs (12/18): K 4.9, creatinine 1.34, digoxin < 0.2 Labs (8/19): K 3.9, creatinine 1.33 Labs (9/19): K 3.7, creatinine 1.46 Labs (3/20): 4.3, creatinine 1.24  ECG (personally reviewed): not performed  PMH: 1. HTN 2. Hypothyroidism 3. Hyperlipidemia 4. Rheumatic heart disease: s/p MV repair and aortic valve repair in 2010, Dr. Roxy Manns. - TEE (2/18): EF 35-30%, dilated LV, rheumatic-appearing aortic valve  s/p repair with moderate AS (AVA 1.1 cm^2), moderate AI.  MV s/p repair with mild-moderate mitral stenosis and eccentric severe MR.   - Echo (8/18): Severe AI but only moderate AS, MR was only mild on this study (may have missed full jet) with mean MV gradient 4 mmHg.  - TEE 06/28/2017: EF 45%, severe AI, mild to moderate AS, mild mitral valve stenosis s/p MV repair - In 7/19, she had TAVR with #23 Edwards Sapien 3 THV.   - Echo post-op in 7/19 showed EF 30-35%, moderate LVH, bioprosthetic aortic valve looked ok,  s/p MV repair with mild mitral stenosis.  5. Chronic systolic CHF: Nonischemic cardiomyopathy, probably related to valvular disease.   - Echo (11/15): EF 40-45%, - Echo (12/16): EF 35-40%. - Echo (1/18): EF 25-30%.  - LHC/RHC (2/18): Normal coronaries, mean RA 18, mean PA 74/39 mean 52, mean PCWP 31, CI 1.98 Fick. - CPX (3/18): peak VO2 11.7, VE/VCO2 slope 40, RER 1.11 => moderate to severely decreased functional capacity due to HF.  - RHC (4/18): mean RA 4, PA 44/17 mean 28, mean PCWP 15, CI 1.73, PVR 4 WU.  - Echo (8/18): EF 30-35%, severe AI with moderate AS but only mild MR noted with mean MV gradient 4 mmHg.  The RV was mildly dilated with mildly decreased systolic function. - TEE [id number]: EF 45%, severe aortic insufficiency, mild to moderate AS, mild mitral valve stenosis s/p MV repair - Coronary CTA (5/19): No obstructive coronary disease.  - Echo (7/19): EF 30-35%, moderate LVH, bioprosthetic aortic valve looked ok, s/p MV repair with mild mitral stenosis.  - Echo (2/20): EF 30-35%, moderate LVH with diffuse hypokinesis, mildly decreased RV systolic function, bioprosthetic aortic valve s/p TAVR without significant stenosis or regurgitation, s/p MV repair with mean gradient 3 mmHg and no MR.  6. Carotid dopplers (6/19): No significant disease.  7. PAD: 9/19 peripheral arterial dopplers showed total occluded proximal right external iliac artery. - CTA confirmed total occlusion of R EIA with collaterals.  Suspect TAVR vascular access complication.   Review of systems complete and found to be negative unless listed in HPI.    Social History   Socioeconomic History  . Marital status: Single    Spouse name: Not on file  . Number of children: Not on file  . Years of education: Not on file  . Highest education level: Not on file  Occupational History  . Not on file  Tobacco Use  . Smoking status: Former Smoker    Packs/day: 0.10    Years: 15.00    Pack years: 1.50    Types: Cigarettes      Quit date: 2011    Years since quitting: 10.3  . Smokeless tobacco: Never Used  Substance and Sexual Activity  . Alcohol use: Never  . Drug use: Never  . Sexual activity: Not Currently  Other Topics Concern  . Not on file  Social History Narrative   Unemployed, on disability.   Now is finally reestablished with a PCP.   She is a former smoker, quit in 2011. Denies alcohol consumption   Tries to walk routinely.   Social Determinants of Health   Financial Resource Strain:   . Difficulty of Paying Living Expenses:   Food Insecurity:   . Worried About Charity fundraiser in the Last Year:   . Arboriculturist in the Last Year:   Transportation Needs:   . Film/video editor (Medical):   [name]  Lack of Transportation (Non-Medical):   Physical Activity:   . Days of Exercise per Week:   . Minutes of Exercise per Session:   Stress:   . Feeling of Stress :   Social Connections:   . Frequency of Communication with Friends and Family:   . Frequency of Social Gatherings with Friends and Family:   . Attends Religious Services:   . Active Member of Clubs or Organizations:   . Attends Archivist Meetings:   [name] Marital Status:   Intimate Partner Violence:   . Fear of Current or Ex-Partner:   . Emotionally Abused:   [name] Physically Abused:   . Sexually Abused:    Family History  Problem Relation Age of Onset  . Heart disease Mother   . CVA Mother     Current Outpatient Medications  Medication Sig Dispense Refill  . acetaminophen (TYLENOL) 325 MG tablet Take 650 mg every 6 (six) hours as needed by mouth for mild pain.    [name] albuterol (PROVENTIL HFA;VENTOLIN HFA) 108 (90 BASE) MCG/ACT inhaler Inhale 2 puffs into the lungs every 6 (six) hours as needed for shortness of breath.     [name] aspirin 81 MG chewable tablet Chew 1 tablet (81 mg total) by mouth daily. 30 tablet 3  . beclomethasone (QVAR) 40 MCG/ACT inhaler Inhale 2 puffs into the lungs daily as needed (shortness of  breath).     . carvedilol (COREG) 6.25 MG tablet Take 1 tablet (6.25 mg total) by mouth 2 (two) times daily. Needs appt for further refills 60 tablet 2  . docusate sodium (COLACE) 100 MG capsule Take 1 capsule (100 mg total) by mouth 2 (two) times daily. 60 capsule 3  . furosemide (LASIX) 40 MG tablet Take 40mg  in the AM and 20mg  in the PM 45 tablet 11  . levothyroxine (SYNTHROID, LEVOTHROID) 125 MCG tablet Take 125 mcg by mouth daily before breakfast.   0  . potassium chloride (KLOR-CON) 10 MEQ tablet TAKE 4 TABLETS(40 MEQ) BY MOUTH DAILY Need appointment for future refills 120 tablet 0  . rosuvastatin (CRESTOR) 20 MG tablet Take 1 tablet (20 mg total) by mouth daily. Need follow up for future refills 30 tablet 0  . sacubitril-valsartan (ENTRESTO) 24-26 MG Take 1 tablet by mouth 2 (two) times daily. 60 tablet 0  . spironolactone (ALDACTONE) 25 MG tablet TAKE 1 TABLET(25 MG) BY MOUTH DAILY 30 tablet 5   No current facility-administered medications for this encounter.   Vitals:   09/30/19 1134  BP: 128/80  Pulse: 65  SpO2: 100%  Weight: 94.5 kg (208 lb 6.4 oz)    Wt Readings from Last 3 Encounters:  09/30/19 94.5 kg (208 lb 6.4 oz)  07/26/18 93.2 kg (205 lb 6.4 oz)  06/27/18 92.3 kg (203 lb 6.4 oz)   PHYSICAL EXAM: General:  Well appearing, moderately obese AAF. No respiratory difficulty HEENT: normal Neck: supple. no JVD. Carotids 2+ bilat; no bruits. No lymphadenopathy or thyromegaly appreciated. Cor: PMI nondisplaced. Regular rate & rhythm. No rubs, gallops or murmurs. Lungs: clear Abdomen: soft, nontender, nondistended. No hepatosplenomegaly. No bruits or masses. Good bowel sounds. Extremities: no cyanosis, clubbing, rash, edema Neuro: alert & oriented x 3, cranial nerves grossly intact. moves all 4 extremities w/o difficulty. Affect pleasant.   Assessment/Plan: 1. 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  She had aortic and mitral valve repairs in 2010.  It was felt that  she would not be a very good Mitraclip candidate as she already has  a degree of mitral stenosis.  TEE in 2/18 showed moderate AS/moderate AI, mild to moderate MS, severe eccentric MR.  Echo in 8/18 showed moderate AS/severe AI but only mild mitral stenosis and mild MR noted. Hemodynamics looked better on 4/18 RHC though cardiac output still marginal.  Repeat TEE 06/28/2017: EF 45%, severe aortic insufficiency, mild to moderate AS, mild mitral valve stenosis s/p MV repair with trivial MR. She had TAVR in 7/19, feeling much better since then.  Echo 06/27/18 stable bioprosthetic aortic valve, s/p mitral valve repair without significant stenosis or regurgitation.  - continues to do well. Denies dyspnea. No CP  - Continue ASA 81 daily.  - Now off plavix.  - we discussed SBE prophylaxis, but she reports all teeth have been extracted  2. Chronic systolic CHF:  Nonischemic cardiomyopathy (no significant disease on 2/18 LHC).  It is possible that this cardiomyopathy is driven by her valvular disease. Echo (8/18) with EF 30-35% RV mildly dilated. TEE 06/28/2017: EF 45%, severe aortic insufficiency, mild to moderate AS, mild mitral valve stenosis s/p MV repair with only trivial MR. Post-TAVR echo showed EF down to 30-35%, and Echo 06/27/18 shows EF stable in 30-35% range.  This may reflect myocardial dysfunction from prior long-standing aortic insufficiency.   - NYHA Class II. Euvolemic on exam  - Continue Lasix 40 mg daily.   - Resume Entresto 24/26 mg BID (ran out of refills x 1 week ago).    - Did not tolerate Bidil due to hypotension - Continue spironolactone 25 mg daily.  - Continue Coreg 9.375 mg BID - Check BMP today  - Repeat Echo. If EF remains < 35% , will refer to EP for ICD.  3. CKD stage III:  - BMET today.  4. PAD: Known occluded right EIA likely due to TAVR vascular access complication. Had collaterals on CTA.   - denies any significant claudication   - Sees Dr. Donnetta Hutching.  5. Hyperlipidemia: resume  Crestor.  - Check Lipid panel and CMP 6. Suspect OSA: - Will reorder sleep study.     Lyda Jester, PA-C 09/30/2019

## 2019-09-30 NOTE — Patient Instructions (Signed)
Labs today We will only contact you if something comes back abnormal or we need to make some changes. Otherwise no news is good news!  Your provider has recommended that you have a home sleep study.  [employer] is the company that does these test.  They will contact you by phone and must speak with you before they can ship the equipment.  Once they have spoken with you they will send the equipment right to your home with instructions on how to set it up.  Once you have completed the test simply box all the equipment back up and mail back to the company.  IF you have any questions or issues with the equipment please call the company directly at [phone].  If your test is positive for sleep apnea and you need a home CPAP machine you will be contacted by Dr Theodosia Blender office [facility]) to set this up.  Your physician has requested that you have an echocardiogram. Echocardiography is a painless test that uses sound waves to create images of your heart. It provides your doctor with information about the size and shape of your heart and how well your heart's chambers and valves are working. This procedure takes approximately one hour. There are no restrictions for this procedure.   Your physician recommends that you schedule a follow-up appointment in: 4-6 week  in the Advanced Practitioners (PA/NP) Clinic    Do the following things EVERYDAY: 1) Weigh yourself in the morning before breakfast. Write it down and keep it in a log. 2) Take your medicines as prescribed 3) Eat low salt foods--Limit salt (sodium) to 2000 mg per day.  4) Stay as active as you can everyday 5) Limit all fluids for the day to less than 2 liters  At the [facility], you and your health needs are our priority. As part of our continuing mission to provide you with exceptional heart care, we have created designated Provider Care Teams. These Care Teams include your primary Cardiologist (physician) and  Advanced Practice Providers (APPs- Physician Assistants and Nurse Practitioners) who all work together to provide you with the care you need, when you need it.   You may see any of the following providers on your designated Care Team at your next follow up: [name] Dr Glori Bickers . Dr Loralie Champagne . Darrick Grinder, NP . Lyda Jester, PA . Audry Riles, PharmD   Please be sure to bring in all your medications bottles to every appointment.

## 2019-09-30 NOTE — Telephone Encounter (Signed)
Patient Advocate Encounter   Received notification from [facility] Medicaid that prior authorization for Delene Loll is required.   PA submitted on [facility] Tracks Confirmation #: [id number] W Recipient ID: [id number] L Status is pending   Will continue to follow.  Audry Riles, PharmD, BCPS, BCCP, CPP Heart Failure Clinic Pharmacist [phone]

## 2019-09-30 NOTE — Progress Notes (Signed)
Patient Name:  [name]        DOB: 07/16/1946      Height: 4 9"    Weight: 208  Office Name:Advanced Heart Failure Clinic         Referring Provider:Sascha Baugher Rosita Fire, PA/ Dalton McLean,MD  Today's Date:09/30/2019   STOP BANG RISK ASSESSMENT S (snore) Have you been told that you snore?     YES   T (tired) Are you often tired, fatigued, or sleepy during the day?   NO  O (obstruction) Do you stop breathing, choke, or gasp during sleep? NO   P (pressure) Do you have or are you being treated for high blood pressure? YES   B (BMI) Is your body index greater than 35 kg/m? YES   A (age) Are you 43 years old or older? YES   N (neck) Do you have a neck circumference greater than 16 inches?   NO   G (gender) Are you a female? NO   TOTAL STOP/BANG "YES" ANSWERS                                                                        For Office Use Only              Procedure Order Form    YES to 3+ Stop Bang questions OR two clinical symptoms - patient qualifies for [employer] (CPT 95800)     Submit: This Form + Patient Face Sheet + Clinical Note via [employer] or Fax: [phone]         Clinical Notes: Will consult Sleep Specialist and refer for management of therapy due to patient increased risk of Sleep Apnea. Ordering a sleep study due to the following two clinical symptoms: Excessive daytime sleepiness G47.10Loud snoring R06.83 / Depression F32.9 / Unrefreshed by sleep G47.8 / Impotence N52.9 / History of high blood pressure R03.0 / Insomnia G47.00    I understand that I am proceeding with a home sleep apnea test as ordered by my treating physician. I understand that untreated sleep apnea is a serious cardiovascular risk factor and it is my responsibility to perform the test and seek management for sleep apnea. I will be contacted with the results and be managed for sleep apnea by a local sleep physician. I will be receiving equipment and further instructions from [facility].  I shall promptly ship back the equipment via the included mailing label. I understand my insurance will be billed for the test and as the patient I am responsible for any insurance related out-of-pocket costs incurred. I have been provided with written instructions and can call for additional video or telephonic instruction, with 24-hour availability of qualified personnel to answer any questions: Patient Help Desk [phone].  Patient Signature ______________________________________________________   Date______________________ Patient Telemedicine Verbal Consent

## 2019-10-01 ENCOUNTER — Telehealth (HOSPITAL_COMMUNITY): Payer: Self-pay | Admitting: Cardiology

## 2019-10-01 NOTE — Telephone Encounter (Signed)
Order, OV note, stop bang and demographics all faxed to [employer] at [phone]  

## 2019-10-02 NOTE — Telephone Encounter (Signed)
Advanced Heart Failure Patient Advocate Encounter  Prior Authorization for Delene Loll has been approved.    PA# [id number] Effective dates: 09/30/2019 - 09/24/2020  Patients co-pay is $3.00  Audry Riles, PharmD, BCPS, BCCP, CPP Heart Failure Clinic Pharmacist [phone]

## 2019-10-31 ENCOUNTER — Ambulatory Visit (HOSPITAL_COMMUNITY): Payer: Medicaid Other

## 2019-10-31 ENCOUNTER — Encounter (HOSPITAL_COMMUNITY): Payer: Medicaid Other

## 2019-11-04 ENCOUNTER — Ambulatory Visit (HOSPITAL_COMMUNITY)
Admission: RE | Admit: 2019-11-04 | Discharge: 2019-11-04 | Disposition: A | Discharge status: Home / Self Care | Payer: Medicaid Other | Source: Ambulatory Visit | Attending: Cardiology | Admitting: Cardiology

## 2019-11-04 ENCOUNTER — Other Ambulatory Visit: Payer: Self-pay

## 2019-11-04 DIAGNOSIS — E785 Hyperlipidemia, unspecified: Secondary | ICD-10-CM | POA: Diagnosis not present

## 2019-11-04 DIAGNOSIS — I110 Hypertensive heart disease with heart failure: Secondary | ICD-10-CM | POA: Diagnosis not present

## 2019-11-04 DIAGNOSIS — I272 Pulmonary hypertension, unspecified: Secondary | ICD-10-CM | POA: Insufficient documentation

## 2019-11-04 DIAGNOSIS — I351 Nonrheumatic aortic (valve) insufficiency: Secondary | ICD-10-CM | POA: Diagnosis not present

## 2019-11-04 DIAGNOSIS — I5022 Chronic systolic (congestive) heart failure: Secondary | ICD-10-CM | POA: Diagnosis not present

## 2019-11-04 MED ORDER — PERFLUTREN LIPID MICROSPHERE
1.0000 mL | INTRAVENOUS | Status: AC | PRN
  Administered 2019-11-04: 2 mL via INTRAVENOUS
  Filled 2019-11-04: qty 10

## 2019-11-04 NOTE — Progress Notes (Signed)
Echocardiogram 2D Echocardiogram has been performed.  Oneal Deputy Ulyssa Walthour 11/04/2019, 2:20 PM

## 2019-11-11 ENCOUNTER — Other Ambulatory Visit (HOSPITAL_COMMUNITY): Payer: Self-pay | Admitting: *Deleted

## 2019-11-11 DIAGNOSIS — I5022 Chronic systolic (congestive) heart failure: Secondary | ICD-10-CM

## 2019-11-11 MED ORDER — POTASSIUM CHLORIDE ER 10 MEQ PO TBCR: EXTENDED_RELEASE_TABLET | ORAL | 0 refills | Status: DC

## 2019-11-11 MED ORDER — CARVEDILOL 6.25 MG PO TABS: 6.2500 mg | ORAL_TABLET | Freq: Two times a day (BID) | ORAL | 2 refills | Status: DC

## 2019-11-12 ENCOUNTER — Telehealth (HOSPITAL_COMMUNITY): Payer: Self-pay | Admitting: Surgery

## 2019-11-12 ENCOUNTER — Telehealth (HOSPITAL_COMMUNITY): Payer: Self-pay | Admitting: Vascular Surgery

## 2019-11-12 DIAGNOSIS — I5042 Chronic combined systolic (congestive) and diastolic (congestive) heart failure: Secondary | ICD-10-CM

## 2019-11-12 DIAGNOSIS — I5022 Chronic systolic (congestive) heart failure: Secondary | ICD-10-CM

## 2019-11-12 NOTE — Telephone Encounter (Signed)
Left pt message giving sleep study appt 11/28/19 @ 8pm, covid screening 7/6 @ 2 pm, asked pt to call back to confirm

## 2019-11-12 NOTE — Telephone Encounter (Signed)
Received information from [employer] that ordered home sleep study will not be performed secondary to [facility] insurance is not contracted with company.

## 2019-11-26 ENCOUNTER — Other Ambulatory Visit (HOSPITAL_COMMUNITY): Payer: Medicaid Other

## 2019-11-27 ENCOUNTER — Other Ambulatory Visit (HOSPITAL_COMMUNITY): Payer: Self-pay | Admitting: *Deleted

## 2019-11-27 MED ORDER — FUROSEMIDE 40 MG PO TABS: ORAL_TABLET | ORAL | 11 refills | Status: DC

## 2019-11-28 ENCOUNTER — Encounter (HOSPITAL_BASED_OUTPATIENT_CLINIC_OR_DEPARTMENT_OTHER): Payer: Medicaid Other | Admitting: Cardiology

## 2019-12-02 ENCOUNTER — Other Ambulatory Visit (HOSPITAL_COMMUNITY): Payer: Medicaid Other

## 2019-12-04 ENCOUNTER — Ambulatory Visit (HOSPITAL_BASED_OUTPATIENT_CLINIC_OR_DEPARTMENT_OTHER): Payer: Medicaid Other | Attending: Cardiology | Admitting: Cardiology

## 2019-12-12 ENCOUNTER — Other Ambulatory Visit (HOSPITAL_COMMUNITY): Payer: Self-pay | Admitting: Cardiology

## 2019-12-17 ENCOUNTER — Encounter (HOSPITAL_COMMUNITY): Payer: Medicaid Other

## 2019-12-18 ENCOUNTER — Other Ambulatory Visit (HOSPITAL_COMMUNITY): Payer: Self-pay | Admitting: Internal Medicine

## 2019-12-18 DIAGNOSIS — I5022 Chronic systolic (congestive) heart failure: Secondary | ICD-10-CM

## 2020-01-23 ENCOUNTER — Encounter (HOSPITAL_COMMUNITY): Payer: Self-pay

## 2020-01-23 ENCOUNTER — Ambulatory Visit (HOSPITAL_COMMUNITY)
Admission: RE | Admit: 2020-01-23 | Discharge: 2020-01-23 | Disposition: A | Discharge status: Home / Self Care | Payer: Medicaid Other | Source: Ambulatory Visit | Attending: Internal Medicine | Admitting: Internal Medicine

## 2020-01-23 ENCOUNTER — Other Ambulatory Visit: Payer: Self-pay

## 2020-01-23 VITALS — BP 108/70 | HR 74 | Wt 203.0 lb

## 2020-01-23 DIAGNOSIS — N183 Chronic kidney disease, stage 3 unspecified: Secondary | ICD-10-CM | POA: Insufficient documentation

## 2020-01-23 DIAGNOSIS — Z560 Unemployment, unspecified: Secondary | ICD-10-CM | POA: Diagnosis not present

## 2020-01-23 DIAGNOSIS — Z7982 Long term (current) use of aspirin: Secondary | ICD-10-CM | POA: Insufficient documentation

## 2020-01-23 DIAGNOSIS — I5022 Chronic systolic (congestive) heart failure: Secondary | ICD-10-CM | POA: Diagnosis not present

## 2020-01-23 DIAGNOSIS — I080 Rheumatic disorders of both mitral and aortic valves: Secondary | ICD-10-CM | POA: Diagnosis not present

## 2020-01-23 DIAGNOSIS — I130 Hypertensive heart and chronic kidney disease with heart failure and stage 1 through stage 4 chronic kidney disease, or unspecified chronic kidney disease: Secondary | ICD-10-CM | POA: Diagnosis not present

## 2020-01-23 DIAGNOSIS — Z79890 Hormone replacement therapy (postmenopausal): Secondary | ICD-10-CM | POA: Diagnosis not present

## 2020-01-23 DIAGNOSIS — I739 Peripheral vascular disease, unspecified: Secondary | ICD-10-CM | POA: Diagnosis not present

## 2020-01-23 DIAGNOSIS — E039 Hypothyroidism, unspecified: Secondary | ICD-10-CM | POA: Diagnosis not present

## 2020-01-23 DIAGNOSIS — Z7951 Long term (current) use of inhaled steroids: Secondary | ICD-10-CM | POA: Diagnosis not present

## 2020-01-23 DIAGNOSIS — Z79899 Other long term (current) drug therapy: Secondary | ICD-10-CM | POA: Diagnosis not present

## 2020-01-23 DIAGNOSIS — Z8249 Family history of ischemic heart disease and other diseases of the circulatory system: Secondary | ICD-10-CM | POA: Insufficient documentation

## 2020-01-23 DIAGNOSIS — I272 Pulmonary hypertension, unspecified: Secondary | ICD-10-CM | POA: Diagnosis not present

## 2020-01-23 DIAGNOSIS — Z953 Presence of xenogenic heart valve: Secondary | ICD-10-CM | POA: Diagnosis not present

## 2020-01-23 DIAGNOSIS — I428 Other cardiomyopathies: Secondary | ICD-10-CM | POA: Diagnosis not present

## 2020-01-23 DIAGNOSIS — Z87891 Personal history of nicotine dependence: Secondary | ICD-10-CM | POA: Insufficient documentation

## 2020-01-23 DIAGNOSIS — E785 Hyperlipidemia, unspecified: Secondary | ICD-10-CM | POA: Diagnosis not present

## 2020-01-23 LAB — BASIC METABOLIC PANEL
Anion gap: 8 (ref 5–15)
BUN: 18 mg/dL (ref 6–20)
CO2: 24 mmol/L (ref 22–32)
Calcium: 9.3 mg/dL (ref 8.9–10.3)
Chloride: 104 mmol/L (ref 98–111)
Creatinine, Ser: 1.38 mg/dL — ABNORMAL HIGH (ref 0.44–1.00)
GFR calc Af Amer: 49 mL/min — ABNORMAL LOW (ref >60)
GFR calc non Af Amer: 42 mL/min — ABNORMAL LOW (ref >60)
Glucose, Bld: 120 mg/dL — ABNORMAL HIGH (ref 70–99)
Potassium: 4.9 mmol/L (ref 3.5–5.1)
Sodium: 136 mmol/L (ref 135–145)

## 2020-01-23 NOTE — Patient Instructions (Addendum)
It was great to see you today! No medication changes are needed at this time.  Labs today We will only contact you if something comes back abnormal or we need to make some changes. Otherwise no news is good news!   You have been referred to [facility] Address: [address], [place], [place] [address] Phone: [phone] -please give them a call to reschedule your sleep study    Your physician recommends that you schedule a follow-up appointment in: 6 months with Dr Aundra Dubin -please give our office a call in February 2022 for a March 2022 appointment   If you have any questions or concerns before your next appointment please send [place] a message through [place] or call our office at [phone].    TO LEAVE A MESSAGE FOR THE NURSE SELECT OPTION 2, PLEASE LEAVE A MESSAGE INCLUDING: . YOUR NAME . DATE OF BIRTH . CALL BACK NUMBER . REASON FOR CALL**this is important as we prioritize the call backs  YOU WILL RECEIVE A CALL BACK THE SAME DAY AS LONG AS YOU CALL BEFORE 4:00 PM

## 2020-01-23 NOTE — Progress Notes (Signed)
Advanced Heart Failure Clinic Note   PCP: Roe Coombs Cardiology: Dr. Ellyn Hack HF Cardiology: Dr. Aundra Dubin  Reason for Visit/: F/u for chronic systolic heart failure/ valvular heart disease   [name] is a 59 y.o. female  with history of rheumatic heart disease s/p aortic and mitral valve repairs in 2010 presents for followup of CHF.  She had aortic and mitral repairs in 2010.  Afterwards, EF was mildly low in the 40-45% range.  However, EF had dropped to 25-30% on 1/18 echo.  TEE showed severe MR, moderate AI, moderate aortic stenosis.  RHC/LHC in 2/18 showed significantly elevated LV and RV filling pressures.    At her initial appointment here, she was very dyspneic with exertion, with orthopnea and PND. Her meds were adjusted and Lasix increased.  She has seen Dr. Roxy Manns since that time for evaluation for redo valve surgery.  She would require high risk mitral and aortic valve replacements. For now, the plan will be close monitoring with surgery if starts to decline or has CHF exacerbation. CPX was done in 3/18 showing moderate to severely decreased functional capacity.  RHC in 4/18 showed optimized filling pressures but low cardiac output.  Repeat echo in 8/18 showed EF 30-35%, severe AI with moderate AS but only mild MR noted.  The RV was mildly dilated with mildly decreased systolic function.   TEE 06/28/2017: EF 45%, severe AI, mild to moderate AS, mild mitral valve stenosis s/p MV repair  In 7/19, she had TAVR with #23 Edwards Sapien 3 THV.  Echo post-op in 7/19 showed EF 30-35%, moderate LVH, bioprosthetic aortic valve looked ok, s/p MV repair with mild mitral stenosis.   She developed pain in her right leg and was found to have occluded right EIA on dopplers in 9/19, confirmed by CTA. Suspect this was a vascular access complication of TAVR.  She was seen by Dr. Donnetta Hutching, conservative treatment recommended.   Echo 06/27/18 with EF  30-35%, stable TAVR valve s/p MVR repair with no  regurgitation or significant stenosis.   Repeat echo 5/21 showed improved LVEF, up to 40-45%. RV mildly reduced.  AV prosthesis w/ mild paravalvular leak, mild AI. Mitral valve prosthesis ok. No mitral regurgitation or stenosis. The mean mitral valve gradient is 3.0 mmHg.   At last clinic appt, she had ran out of her Entresto. It was restarted, 24-26 mg bid. She was also ordered to get a sleep study to r/o OSA. Insurance did not approve home sleep study. She was scheduled for in house sleep study but missed the appointment due to lack of child care.    She returns to clinic today for f/u. Doing well. Volume status, wt stable. BP soft but stable on Entresto, no significant orthostatic symptoms. NYHA Class II-III. No edema. No orthopnea/PND.    Labs (2/18): K 3.8 => 3.4, creatinine 1.09 => 1.07, hgb 12.7, BNP 1010 Labs (3/18): K 4.6, creatinine 2.43 => 1.74 Labs (4/18): K 4, creatinine 1.28, digoxin 0.3 Labs 09/2016: K 4.4, creatinine 1.26.  Labs 01/19/2017: K 4.4, Creatinine 1.21, digoxin 0.3 Labs (10/18): K 4.7, creatinine 1.10, digoxin 0.3 Labs (12/18): K 4.9, creatinine 1.34, digoxin < 0.2 Labs (8/19): K 3.9, creatinine 1.33 Labs (9/19): K 3.7, creatinine 1.46 Labs (3/20): K 4.3, creatinine 1.24 Labs (5/21): K 4.3, creatinine 1.24, hgb 13, LDL 47  ECG: not performed  PMH: 1. HTN 2. Hypothyroidism 3. Hyperlipidemia 4. Rheumatic heart disease: s/p MV repair and aortic valve repair in 2010, Dr.  Roxy Manns. - TEE (2/18): EF 35-30%, dilated LV, rheumatic-appearing aortic valve s/p repair with moderate AS (AVA 1.1 cm^2), moderate AI.  MV s/p repair with mild-moderate mitral stenosis and eccentric severe MR.   - Echo (8/18): Severe AI but only moderate AS, MR was only mild on this study (may have missed full jet) with mean MV gradient 4 mmHg.  - TEE 06/28/2017: EF 45%, severe AI, mild to moderate AS, mild mitral valve stenosis s/p MV repair - In 7/19, she had TAVR with #23 Edwards Sapien 3 THV.   -  Echo post-op in 7/19 showed EF 30-35%, moderate LVH, bioprosthetic aortic valve looked ok, s/p MV repair with mild mitral stenosis.  5. Chronic systolic CHF: Nonischemic cardiomyopathy, probably related to valvular disease.   - Echo (11/15): EF 40-45%, - Echo (12/16): EF 35-40%. - Echo (1/18): EF 25-30%.  - LHC/RHC (2/18): Normal coronaries, mean RA 18, mean PA 74/39 mean 52, mean PCWP 31, CI 1.98 Fick. - CPX (3/18): peak VO2 11.7, VE/VCO2 slope 40, RER 1.11 => moderate to severely decreased functional capacity due to HF.  - RHC (4/18): mean RA 4, PA 44/17 mean 28, mean PCWP 15, CI 1.73, PVR 4 WU.  - Echo (8/18): EF 30-35%, severe AI with moderate AS but only mild MR noted with mean MV gradient 4 mmHg.  The RV was mildly dilated with mildly decreased systolic function. - TEE 1/96: EF 45%, severe aortic insufficiency, mild to moderate AS, mild mitral valve stenosis s/p MV repair - Coronary CTA (5/19): No obstructive coronary disease.  - Echo (7/19): EF 30-35%, moderate LVH, bioprosthetic aortic valve looked ok, s/p MV repair with mild mitral stenosis.  - Echo (2/20): EF 30-35%, moderate LVH with diffuse hypokinesis, mildly decreased RV systolic function, bioprosthetic aortic valve s/p TAVR without significant stenosis or regurgitation, s/p MV repair with mean gradient 3 mmHg and no MR.  6. Carotid dopplers (6/19): No significant disease.  7. PAD: 9/19 peripheral arterial dopplers showed total occluded proximal right external iliac artery. - CTA confirmed total occlusion of R EIA with collaterals.  Suspect TAVR vascular access complication.   Review of systems complete and found to be negative unless listed in HPI.    Social History   Socioeconomic History  . Marital status: Single    Spouse name: Not on file  . Number of children: Not on file  . Years of education: Not on file  . Highest education level: Not on file  Occupational History  . Not on file  Tobacco Use  . Smoking status:  Former Smoker    Packs/day: 0.10    Years: 15.00    Pack years: 1.50    Types: Cigarettes    Quit date: 2011    Years since quitting: 10.6  . Smokeless tobacco: Never Used  Vaping Use  . Vaping Use: Never used  Substance and Sexual Activity  . Alcohol use: Never  . Drug use: Never  . Sexual activity: Not Currently  Other Topics Concern  . Not on file  Social History Narrative   Unemployed, on disability.   Now is finally reestablished with a PCP.   She is a former smoker, quit in 2011. Denies alcohol consumption   Tries to walk routinely.   Social Determinants of Health   Financial Resource Strain:   . Difficulty of Paying Living Expenses: Not on file  Food Insecurity:   . Worried About Charity fundraiser in the Last Year: Not on file  .  Ran Out of Food in the Last Year: Not on file  Transportation Needs:   . Lack of Transportation (Medical): Not on file  . Lack of Transportation (Non-Medical): Not on file  Physical Activity:   . Days of Exercise per Week: Not on file  . Minutes of Exercise per Session: Not on file  Stress:   . Feeling of Stress : Not on file  Social Connections:   . Frequency of Communication with Friends and Family: Not on file  . Frequency of Social Gatherings with Friends and Family: Not on file  . Attends Religious Services: Not on file  . Active Member of Clubs or Organizations: Not on file  . Attends Archivist Meetings: Not on file  . Marital Status: Not on file  Intimate Partner Violence:   . Fear of Current or Ex-Partner: Not on file  . Emotionally Abused: Not on file  . Physically Abused: Not on file  . Sexually Abused: Not on file   Family History  Problem Relation Age of Onset  . Heart disease Mother   . CVA Mother     Current Outpatient Medications  Medication Sig Dispense Refill  . acetaminophen (TYLENOL) 325 MG tablet Take 650 mg every 6 (six) hours as needed by mouth for mild pain.    [name] albuterol (PROVENTIL  HFA;VENTOLIN HFA) 108 (90 BASE) MCG/ACT inhaler Inhale 2 puffs into the lungs every 6 (six) hours as needed for shortness of breath.     [name] aspirin 81 MG chewable tablet Chew 1 tablet (81 mg total) by mouth daily. 30 tablet 3  . beclomethasone (QVAR) 40 MCG/ACT inhaler Inhale 2 puffs into the lungs daily as needed (shortness of breath).     . carvedilol (COREG) 6.25 MG tablet Take 1 tablet (6.25 mg total) by mouth 2 (two) times daily. Needs appt for further refills 60 tablet 2  . docusate sodium (COLACE) 100 MG capsule Take 1 capsule (100 mg total) by mouth 2 (two) times daily. 60 capsule 3  . furosemide (LASIX) 40 MG tablet Take 40mg  in the AM and 20mg  in the PM 45 tablet 11  . levothyroxine (SYNTHROID, LEVOTHROID) 125 MCG tablet Take 125 mcg by mouth daily before breakfast.   0  . potassium chloride (KLOR-CON) 10 MEQ tablet TAKE 4 TABLETS(40 MEQ) BY MOUTH DAILY 120 tablet 5  . rosuvastatin (CRESTOR) 20 MG tablet Take 1 tablet (20 mg total) by mouth daily. 30 tablet 2  . sacubitril-valsartan (ENTRESTO) 24-26 MG Take 1 tablet by mouth 2 (two) times daily. 60 tablet 2  . spironolactone (ALDACTONE) 25 MG tablet TAKE 1 TABLET(25 MG) BY MOUTH DAILY 30 tablet 5   No current facility-administered medications for this encounter.   Vitals:   01/23/20 1353  BP: 108/70  Pulse: 74  SpO2: 98%  Weight: 92.1 kg (203 lb)    Wt Readings from Last 3 Encounters:  01/23/20 92.1 kg (203 lb)  09/30/19 94.5 kg (208 lb 6.4 oz)  07/26/18 93.2 kg (205 lb 6.4 oz)   PHYSICAL EXAM: General:  Well appearing, obese. No respiratory difficulty HEENT: normal Neck: supple. no JVD. Carotids 2+ bilat; no bruits. No lymphadenopathy or thyromegaly appreciated. Cor: PMI nondisplaced. Regular rate & rhythm. No rubs, gallops or murmurs. Lungs: clear Abdomen: soft, nontender, nondistended. No hepatosplenomegaly. No bruits or masses. Good bowel sounds. Extremities: no cyanosis, clubbing, rash, edema Neuro: alert & oriented x  3, cranial nerves grossly intact. moves all 4 extremities w/o difficulty. Affect pleasant.  Assessment/Plan: 1. 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  She had aortic and mitral valve repairs in 2010.  It was felt that she would not be a very good Mitraclip candidate as she already has a degree of mitral stenosis.  TEE in 2/18 showed moderate AS/moderate AI, mild to moderate MS, severe eccentric MR.  Echo in 8/18 showed moderate AS/severe AI but only mild mitral stenosis and mild MR noted. Hemodynamics looked better on 4/18 RHC though cardiac output still marginal.  Repeat TEE 06/28/2017: EF 45%, severe aortic insufficiency, mild to moderate AS, mild mitral valve stenosis s/p MV repair with trivial MR. She had TAVR in 7/19, feeling much better since then.  Echo 06/27/18 w/ stable bioprosthetic aortic valve, s/p mitral valve repair without significant stenosis or regurgitation. Most recent eco 5/21 showed  improved LVEF, up to 40-45%. RV mildly reduced.  AV prosthesis w/ mild paravalvular leak, mild AI. Mitral valve prosthesis ok. No mitral regurgitation or stenosis. The mean mitral valve gradient is 3.0 mmHg.  - stable w/o dyspnea. No CP  - Continue ASA 81 daily.  - Now off plavix.  - we discussed SBE prophylaxis, but she reports all teeth have been extracted  2. Chronic systolic CHF:  Nonischemic cardiomyopathy (no significant disease on 2/18 LHC).  It is possible that this cardiomyopathy is driven by her valvular disease. Echo (8/18) with EF 30-35% RV mildly dilated. TEE 06/28/2017: EF 45%, severe aortic insufficiency, mild to moderate AS, mild mitral valve stenosis s/p MV repair with only trivial MR. Post-TAVR echo showed EF down to 30-35%, and Echo 06/27/18 shows EF stable in 30-35% range. Most recent Echo 5/21 showed improved LVEF, 45%. - EF now out of range for ICD - NYHA Class II. Euvolemic on exam  - Continue Lasix 40 mg daily.   - Continue Entresto 24/26 mg BID. BP too soft for titration - Did  not tolerate Bidil due to hypotension - Continue spironolactone 25 mg daily.  - Continue Coreg 9.375 mg BID - Check BMP today  3. CKD stage III:  - BMET today.  4. PAD: Known occluded right EIA likely due to TAVR vascular access complication. Had collaterals on CTA.   - no significant claudication   - followed by Dr. Donnetta Hutching.  - BP and lipids well controlled  5. Hyperlipidemia: well controlled w/ Crestor, recent lipid panel 5/21 w/ LDL 47 mg/dL  - Continue Crestor 20 mg 6. Suspect OSA: - ordered to get sleep study. Insurance did not approve home study and she missed her scheduled in house test. She will reschedule   F/u in 6 months w/ Dr. Esmeralda Links, PA-C 01/23/2020

## 2020-01-31 ENCOUNTER — Other Ambulatory Visit (HOSPITAL_COMMUNITY): Payer: Self-pay | Admitting: Cardiology

## 2020-02-13 ENCOUNTER — Other Ambulatory Visit (HOSPITAL_COMMUNITY): Payer: Self-pay | Admitting: Cardiology

## 2020-03-31 ENCOUNTER — Other Ambulatory Visit (HOSPITAL_COMMUNITY): Payer: Self-pay | Admitting: Cardiology

## 2020-04-30 ENCOUNTER — Other Ambulatory Visit (HOSPITAL_COMMUNITY): Payer: Self-pay | Admitting: Cardiology

## 2020-05-06 ENCOUNTER — Other Ambulatory Visit (HOSPITAL_COMMUNITY): Payer: Self-pay | Admitting: Cardiology

## 2020-05-24 ENCOUNTER — Other Ambulatory Visit (HOSPITAL_COMMUNITY): Payer: Self-pay | Admitting: Cardiology

## 2020-06-09 ENCOUNTER — Other Ambulatory Visit (HOSPITAL_COMMUNITY): Payer: Self-pay | Admitting: Cardiology

## 2020-06-11 ENCOUNTER — Other Ambulatory Visit (HOSPITAL_COMMUNITY): Payer: Self-pay | Admitting: Cardiology

## 2020-07-07 ENCOUNTER — Other Ambulatory Visit (HOSPITAL_COMMUNITY): Payer: Self-pay | Admitting: Cardiology

## 2020-07-20 ENCOUNTER — Other Ambulatory Visit (HOSPITAL_COMMUNITY): Payer: Self-pay | Admitting: Internal Medicine

## 2020-07-20 DIAGNOSIS — I5022 Chronic systolic (congestive) heart failure: Secondary | ICD-10-CM

## 2020-09-09 ENCOUNTER — Other Ambulatory Visit (HOSPITAL_COMMUNITY): Payer: Self-pay | Admitting: Cardiology

## 2020-10-22 ENCOUNTER — Other Ambulatory Visit (HOSPITAL_COMMUNITY): Payer: Self-pay

## 2020-10-22 MED ORDER — ENTRESTO 24-26 MG PO TABS: 1.0000 | ORAL_TABLET | Freq: Two times a day (BID) | ORAL | 0 refills | Status: DC

## 2020-10-23 ENCOUNTER — Telehealth (HOSPITAL_COMMUNITY): Payer: Self-pay | Admitting: Pharmacy Technician

## 2020-10-23 ENCOUNTER — Other Ambulatory Visit (HOSPITAL_COMMUNITY): Payer: Self-pay

## 2020-10-23 NOTE — Telephone Encounter (Signed)
Advanced Heart Failure Patient Advocate Encounter  Prior Authorization for Delene Loll has been approved.    PA# [id number] Effective dates: 10/23/20 through 10/23/21  Charlann Boxer, CPhT

## 2020-10-23 NOTE — Telephone Encounter (Signed)
Patient Advocate Encounter   Received notification from [place] [employer] that prior authorization for [name] is required.   PA submitted on CoverMyMeds Key [id number] Status is pending   Will continue to follow.

## 2020-11-16 ENCOUNTER — Other Ambulatory Visit (HOSPITAL_COMMUNITY): Payer: Self-pay | Admitting: Cardiology

## 2020-11-24 ENCOUNTER — Other Ambulatory Visit (HOSPITAL_COMMUNITY): Payer: Self-pay | Admitting: *Deleted

## 2020-11-24 ENCOUNTER — Other Ambulatory Visit (HOSPITAL_COMMUNITY): Payer: Self-pay | Admitting: Cardiology

## 2020-12-16 ENCOUNTER — Other Ambulatory Visit (HOSPITAL_COMMUNITY): Payer: Self-pay | Admitting: Cardiology

## 2021-01-01 ENCOUNTER — Telehealth: Payer: Self-pay

## 2021-01-01 NOTE — Telephone Encounter (Signed)
   [name] DOB: 06/30/1960 MRN: [id number]   RIDER WAIVER AND RELEASE OF LIABILITY  For purposes of improving physical access to our facilities, [facility] is pleased to partner with third parties to provide [facility] patients or other authorized individuals the option of convenient, on-demand ground transportation services (the [place]") through use of the technology service that enables users to request on-demand ground transportation from independent third-party providers.  By opting to use and accept these [employer], I, the undersigned, hereby agree on behalf of myself, and on behalf of any minor child using the Government social research officer for whom I am the parent or legal guardian, as follows:  Government social research officer provided to me are provided by independent third-party transportation providers who are not [employer] or employees and who are unaffiliated with [employer]. [place] [facility] is neither a transportation carrier nor a common or public carrier. [facility] has no control over the quality or safety of the transportation that occurs as a result of the [employer]. [facility] cannot guarantee that any third-party transportation provider will complete any arranged transportation service. [facility] makes no representation, warranty, or guarantee regarding the reliability, timeliness, quality, safety, suitability, or availability of any of the Transport Services or that they will be error free. I fully understand that traveling by vehicle involves risks and dangers of serious bodily injury, including permanent disability, paralysis, and death. I agree, on behalf of myself and on behalf of any minor child using the Transport Services for whom I am the parent or legal guardian, that the entire risk arising out of my use of the [employer] remains solely with me, to the maximum extent permitted under applicable law. The [employer] are provided "as  is" and "as available." [facility] disclaims all representations and warranties, express, implied or statutory, not expressly set out in these terms, including the implied warranties of merchantability and fitness for a particular purpose. I hereby waive and release [facility], its agents, employees, officers, directors, representatives, insurers, attorneys, assigns, successors, subsidiaries, and affiliates from any and all past, present, or future claims, demands, liabilities, actions, causes of action, or suits of any kind directly or indirectly arising from acceptance and use of the [employer]. I further waive and release [facility] and its affiliates from all present and future liability and responsibility for any injury or death to persons or damages to property caused by or related to the use of the [employer]. I have read this Waiver and Release of Liability, and I understand the terms used in it and their legal significance. This Waiver is freely and voluntarily given with the understanding that my right (as well as the right of any minor child for whom I am the parent or legal guardian using the [employer]) to legal recourse against [facility] in connection with the [employer] is knowingly surrendered in return for use of these services.   I attest that I read the consent document to [name], gave [name] the opportunity to ask questions and answered the questions asked (if any). I affirm that [name] then provided consent for she's participation in this program.     [name]

## 2021-01-04 ENCOUNTER — Other Ambulatory Visit: Payer: Self-pay

## 2021-01-04 ENCOUNTER — Ambulatory Visit (HOSPITAL_COMMUNITY)
Admission: RE | Admit: 2021-01-04 | Discharge: 2021-01-04 | Disposition: A | Discharge status: Home / Self Care | Payer: Medicaid Other | Source: Ambulatory Visit | Attending: Cardiology | Admitting: Cardiology

## 2021-01-04 ENCOUNTER — Encounter (HOSPITAL_COMMUNITY): Payer: Self-pay | Admitting: Cardiology

## 2021-01-04 ENCOUNTER — Other Ambulatory Visit (HOSPITAL_COMMUNITY): Payer: Self-pay

## 2021-01-04 VITALS — BP 104/60 | HR 75 | Wt 196.4 lb

## 2021-01-04 DIAGNOSIS — I5022 Chronic systolic (congestive) heart failure: Secondary | ICD-10-CM | POA: Insufficient documentation

## 2021-01-04 DIAGNOSIS — Z7901 Long term (current) use of anticoagulants: Secondary | ICD-10-CM | POA: Insufficient documentation

## 2021-01-04 DIAGNOSIS — Z953 Presence of xenogenic heart valve: Secondary | ICD-10-CM | POA: Insufficient documentation

## 2021-01-04 DIAGNOSIS — N182 Chronic kidney disease, stage 2 (mild): Secondary | ICD-10-CM | POA: Diagnosis not present

## 2021-01-04 DIAGNOSIS — Z8249 Family history of ischemic heart disease and other diseases of the circulatory system: Secondary | ICD-10-CM | POA: Diagnosis not present

## 2021-01-04 DIAGNOSIS — E785 Hyperlipidemia, unspecified: Secondary | ICD-10-CM | POA: Insufficient documentation

## 2021-01-04 DIAGNOSIS — Z7984 Long term (current) use of oral hypoglycemic drugs: Secondary | ICD-10-CM | POA: Insufficient documentation

## 2021-01-04 DIAGNOSIS — I130 Hypertensive heart and chronic kidney disease with heart failure and stage 1 through stage 4 chronic kidney disease, or unspecified chronic kidney disease: Secondary | ICD-10-CM | POA: Diagnosis not present

## 2021-01-04 DIAGNOSIS — Z560 Unemployment, unspecified: Secondary | ICD-10-CM | POA: Diagnosis not present

## 2021-01-04 DIAGNOSIS — Z09 Encounter for follow-up examination after completed treatment for conditions other than malignant neoplasm: Secondary | ICD-10-CM | POA: Insufficient documentation

## 2021-01-04 DIAGNOSIS — I428 Other cardiomyopathies: Secondary | ICD-10-CM | POA: Insufficient documentation

## 2021-01-04 DIAGNOSIS — Z87891 Personal history of nicotine dependence: Secondary | ICD-10-CM | POA: Insufficient documentation

## 2021-01-04 DIAGNOSIS — I739 Peripheral vascular disease, unspecified: Secondary | ICD-10-CM | POA: Diagnosis not present

## 2021-01-04 DIAGNOSIS — Z7982 Long term (current) use of aspirin: Secondary | ICD-10-CM | POA: Insufficient documentation

## 2021-01-04 DIAGNOSIS — I080 Rheumatic disorders of both mitral and aortic valves: Secondary | ICD-10-CM | POA: Diagnosis not present

## 2021-01-04 DIAGNOSIS — Z79899 Other long term (current) drug therapy: Secondary | ICD-10-CM | POA: Insufficient documentation

## 2021-01-04 LAB — BASIC METABOLIC PANEL
Anion gap: 9 (ref 5–15)
BUN: 25 mg/dL — ABNORMAL HIGH (ref 6–20)
CO2: 23 mmol/L (ref 22–32)
Calcium: 9.6 mg/dL (ref 8.9–10.3)
Chloride: 102 mmol/L (ref 98–111)
Creatinine, Ser: 1.22 mg/dL — ABNORMAL HIGH (ref 0.44–1.00)
GFR, Estimated: 51 mL/min — ABNORMAL LOW (ref >60)
Glucose, Bld: 97 mg/dL (ref 70–99)
Potassium: 4.5 mmol/L (ref 3.5–5.1)
Sodium: 134 mmol/L — ABNORMAL LOW (ref 135–145)

## 2021-01-04 LAB — LIPID PANEL
Cholesterol: 240 mg/dL — ABNORMAL HIGH (ref 0–200)
HDL: 38 mg/dL — ABNORMAL LOW (ref >40)
LDL Cholesterol: 174 mg/dL — ABNORMAL HIGH (ref 0–99)
Total CHOL/HDL Ratio: 6.3 RATIO
Triglycerides: 141 mg/dL (ref <150)
VLDL: 28 mg/dL (ref 0–40)

## 2021-01-04 MED ORDER — DAPAGLIFLOZIN PROPANEDIOL 10 MG PO TABS: 10.0000 mg | ORAL_TABLET | Freq: Every day | ORAL | 11 refills | Status: DC

## 2021-01-04 MED ORDER — POTASSIUM CHLORIDE ER 10 MEQ PO TBCR
20.0000 meq | EXTENDED_RELEASE_TABLET | Freq: Every day | ORAL | 3 refills | Status: DC
Start: 2021-01-04 — End: 2022-04-29

## 2021-01-04 NOTE — Patient Instructions (Addendum)
EKG done today.  Labs done today. We will contact you only if your labs are abnormal.  START Farxiga '10mg'$  (1 tablet) by mouth daily.  DECREASE Potassium to 59mq(2 packets) by mouth daily.   No other medication changes were made. Please continue all current medications as prescribed.  Your physician recommends that you schedule a follow-up appointment in: 10 days for a lab only appointment and in 3 months with an echo prior to your exam.  If you have any questions or concerns before your next appointment please send [place] message through [email or web address] call our office at [phone]    TO LEAVE A MESSAGE FOR THE NURSE SELECT OPTION 2, PLEASE LEAVE A MESSAGE INCLUDING: YOUR NAME DATE OF BIRTH CALL BACK NUMBER REASON FOR CALL**this is important as we prioritize the call backs  YOU WILL RECEIVE A CALL BACK THE SAME DAY AS LONG AS YOU CALL BEFORE 4:00 PM   Do the following things EVERYDAY: Weigh yourself in the morning before breakfast. Write it down and keep it in a log. Take your medicines as prescribed Eat low salt foods--Limit salt (sodium) to 2000 mg per day.  Stay as active as you can everyday Limit all fluids for the day to less than 2 liters   At [facility] you and your health needs are our priority. As part of our continuing mission to provide you with exceptional heart care, we have created designated Provider Care Teams. These Care Teams include your primary Cardiologist (physician) and Advanced Practice Providers (APPs- Physician Assistants and Nurse Practitioners) who all work together to provide you with the care you need, when you need it.   You may see any of the following providers on your designated Care Team at your next follow up: Dr DGlori BickersDr DHaynes Kerns NP BLyda Jester PUtahLAudry Riles PharmD   Please be sure to bring in all your medications bottles to every appointment.

## 2021-01-05 ENCOUNTER — Other Ambulatory Visit (HOSPITAL_COMMUNITY): Payer: Self-pay

## 2021-01-05 ENCOUNTER — Telehealth (HOSPITAL_COMMUNITY): Payer: Self-pay | Admitting: Pharmacy Technician

## 2021-01-05 ENCOUNTER — Encounter (HOSPITAL_COMMUNITY): Payer: Self-pay

## 2021-01-05 ENCOUNTER — Ambulatory Visit (HOSPITAL_COMMUNITY)
Admission: RE | Admit: 2021-01-05 | Discharge: 2021-01-05 | Disposition: A | Discharge status: Home / Self Care | Payer: Medicaid Other | Source: Ambulatory Visit | Attending: Cardiology | Admitting: Cardiology

## 2021-01-05 NOTE — Progress Notes (Signed)
Advanced Heart Failure Clinic Note   PCP: Roe Coombs Cardiology: Dr. Ellyn Hack HF Cardiology: Dr. Aundra Dubin  60 y.o. 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  She had aortic and mitral repairs in 2010.  Afterwards, EF was mildly low in the 40-45% range.  However, EF had dropped to 25-30% on 1/18 echo.  TEE showed severe MR, moderate AI, moderate aortic stenosis.  RHC/LHC in 2/18 showed significantly elevated LV and RV filling pressures.    At her initial appointment here, she was very dyspneic with exertion, with orthopnea and PND. Her meds were adjusted and Lasix increased.  She has seen Dr. Roxy Manns since that time for evaluation for redo valve surgery.  She would require high risk mitral and aortic valve replacements. For now, the plan will be close monitoring with surgery if starts to decline or has CHF exacerbation. CPX was done in 3/18 showing moderate to severely decreased functional capacity.  RHC in 4/18 showed optimized filling pressures but low cardiac output.  Repeat echo in 8/18 showed EF 30-35%, severe AI with moderate AS but only mild MR noted.  The RV was mildly dilated with mildly decreased systolic function.   TEE 06/28/2017: EF 45%, severe AI, mild to moderate AS, mild mitral valve stenosis s/p MV repair  In 7/19, she had TAVR with #23 Edwards Sapien 3 THV.  Echo post-op in 7/19 showed EF 30-35%, moderate LVH, bioprosthetic aortic valve looked ok, s/p MV repair with mild mitral stenosis.   She developed pain in her right leg and was found to have occluded right EIA on dopplers in 9/19, confirmed by CTA. Suspect this was a vascular access complication of TAVR.  She was seen by Dr. Donnetta Hutching, conservative treatment for now.   Echo in 2/20 showed that EF remains 30-35%, stable TAVR valve, s/p MVR repair with no regurgitation or significant stenosis.   She returns for followup of CHF and aortic valve disease.  She has  been doing well symptomatically.  With diet and exercise, weight has been trending down.  She walks stairs without trouble. She uses a treadmill for exercise.  No significant exertional dyspnea or chest pain.  No orthopnea/PND.  No lightheadedness.     Labs (2/18): K 3.8 => 3.4, creatinine 1.09 => 1.07, hgb 12.7, BNP 1010 Labs (3/18): K 4.6, creatinine 2.43 => 1.74 Labs (4/18): K 4, creatinine 1.28, digoxin 0.3 Labs 09/2016: K 4.4, creatinine 1.26.  Labs 01/19/2017: K 4.4, Creatinine 1.21, digoxin 0.3 Labs (10/18): K 4.7, creatinine 1.10, digoxin 0.3 Labs (12/18): K 4.9, creatinine 1.34, digoxin < 0.2 Labs (8/19): K 3.9, creatinine 1.33 Labs (9/19): K 3.7, creatinine 1.46 Labs (7/21): K 4.9, creatinine 1.38  PMH: 1. HTN 2. Hypothyroidism 3. Hyperlipidemia 4. Rheumatic heart disease: s/p MV repair and aortic valve repair in 2010, Dr. Roxy Manns. - TEE (2/18): EF 35-30%, dilated LV, rheumatic-appearing aortic valve s/p repair with moderate AS (AVA 1.1 cm^2), moderate AI.  MV s/p repair with mild-moderate mitral stenosis and eccentric severe MR.   - Echo (8/18): Severe AI but only moderate AS, MR was only mild on this study (may have missed full jet) with mean MV gradient 4 mmHg.  - TEE 06/28/2017: EF 45%, severe AI, mild to moderate AS, mild mitral valve stenosis s/p MV repair - In 7/19, she had TAVR with #23 Edwards Sapien 3 THV.   - Echo post-op in 7/19 showed EF 30-35%, moderate  LVH, bioprosthetic aortic valve looked ok, s/p MV repair with mild mitral stenosis.  5. Chronic systolic CHF: Nonischemic cardiomyopathy, probably related to valvular disease.   - Echo (11/15): EF 40-45%, - Echo (12/16): EF 35-40%. - Echo (1/18): EF 25-30%.  - LHC/RHC (2/18): Normal coronaries, mean RA 18, mean PA 74/39 mean 52, mean PCWP 31, CI 1.98 Fick. - CPX (3/18): peak VO2 11.7, VE/VCO2 slope 40, RER 1.11 => moderate to severely decreased functional capacity due to HF.  - RHC (4/18): mean RA 4, PA 44/17 mean 28,  mean PCWP 15, CI 1.73, PVR 4 WU.  - Echo (8/18): EF 30-35%, severe AI with moderate AS but only mild MR noted with mean MV gradient 4 mmHg.  The RV was mildly dilated with mildly decreased systolic function. - TEE [id number]: EF 45%, severe aortic insufficiency, mild to moderate AS, mild mitral valve stenosis s/p MV repair - Coronary CTA (5/19): No obstructive coronary disease.  - Echo (7/19): EF 30-35%, moderate LVH, bioprosthetic aortic valve looked ok, s/p MV repair with mild mitral stenosis.  - Echo (2/20): EF 30-35%, moderate LVH with diffuse hypokinesis, mildly decreased RV systolic function, bioprosthetic aortic valve s/p TAVR without significant stenosis or regurgitation, s/p MV repair with mean gradient 3 mmHg and no MR.  6. Carotid dopplers (6/19): No significant disease.  7. PAD: 9/19 peripheral arterial dopplers showed total occluded proximal right external iliac artery. - CTA confirmed total occlusion of R EIA with collaterals.  Suspect TAVR vascular access complication.   Social History   Socioeconomic History   Marital status: Single    Spouse name: Not on file   Number of children: Not on file   Years of education: Not on file   Highest education level: Not on file  Occupational History   Not on file  Tobacco Use   Smoking status: Former    Packs/day: 0.10    Years: 15.00    Pack years: 1.50    Types: Cigarettes    Quit date: 2011    Years since quitting: 11.6   Smokeless tobacco: Never  Vaping Use   Vaping Use: Never used  Substance and Sexual Activity   Alcohol use: Never   Drug use: Never   Sexual activity: Not Currently  Other Topics Concern   Not on file  Social History Narrative   Unemployed, on disability.   Now is finally reestablished with a PCP.   She is a former smoker, quit in 2011. Denies alcohol consumption   Tries to walk routinely.   Social Determinants of Health   Financial Resource Strain: Not on file  Food Insecurity: Not on file   Transportation Needs: Not on file  Physical Activity: Not on file  Stress: Not on file  Social Connections: Not on file  Intimate Partner Violence: Not on file   Family History  Problem Relation Age of Onset   Heart disease Mother    CVA Mother    Review of systems complete and found to be negative unless listed in HPI.    Current Outpatient Medications  Medication Sig Dispense Refill   acetaminophen (TYLENOL) 325 MG tablet Take 650 mg every 6 (six) hours as needed by mouth for mild pain.     albuterol (PROVENTIL HFA;VENTOLIN HFA) 108 (90 BASE) MCG/ACT inhaler Inhale 2 puffs into the lungs every 6 (six) hours as needed for shortness of breath.      aspirin 81 MG chewable tablet Chew 1 tablet (81 mg total) by  mouth daily. 30 tablet 3   beclomethasone (QVAR) 40 MCG/ACT inhaler Inhale 2 puffs into the lungs daily as needed (shortness of breath).      carvedilol (COREG) 6.25 MG tablet TAKE 1 TABLET(6.25 MG) BY MOUTH TWICE DAILY 60 tablet 11   dapagliflozin propanediol (FARXIGA) 10 MG TABS tablet Take 1 tablet (10 mg total) by mouth daily before breakfast. 30 tablet 11   furosemide (LASIX) 40 MG tablet Take 40 mg by mouth daily.     levothyroxine (SYNTHROID) 125 MCG tablet Take 0.5 mcg by mouth daily before breakfast. 1/2 tablet on Sunday     levothyroxine (SYNTHROID, LEVOTHROID) 125 MCG tablet Take 125 mcg by mouth daily before breakfast.   0   spironolactone (ALDACTONE) 25 MG tablet TAKE 1 TABLET(25 MG) BY MOUTH DAILY 30 tablet 5   potassium chloride (KLOR-CON) 10 MEQ tablet Take 2 tablets (20 mEq total) by mouth daily. 180 tablet 3   No current facility-administered medications for this encounter.   Vitals:   01/04/21 1545  BP: 104/60  Pulse: 75  SpO2: 99%  Weight: 89.1 kg (196 lb 6.4 oz)    Wt Readings from Last 3 Encounters:  01/04/21 89.1 kg (196 lb 6.4 oz)  01/23/20 92.1 kg (203 lb)  09/30/19 94.5 kg (208 lb 6.4 oz)   Physical Exam  General: NAD Neck: No JVD, no  thyromegaly or thyroid nodule.  Lungs: Clear to auscultation bilaterally with normal respiratory effort. CV: Nondisplaced PMI.  Heart regular S1/S2, no S3/S4, 2/6 early SEM RUSB.  No peripheral edema.  No carotid bruit.  Normal pedal pulses.  Abdomen: Soft, nontender, no hepatosplenomegaly, no distention.  Skin: Intact without lesions or rashes.  Neurologic: Alert and oriented x 3.  Psych: Normal affect. Extremities: No clubbing or cyanosis.  HEENT: Normal.   Assessment/Plan: 1. 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  She had aortic and mitral valve repairs in 2010.   It was felt that she would not be a very good Mitraclip candidate as she already has a degree of mitral stenosis.  TEE in 2/18 showed moderate AS/moderate AI , mild to moderate MS, severe eccentric MR.  Echo in 8/18 showed moderate AS/severe AI but only mild mitral stenosis and mild MR noted. Hemodynamics looked better on 4/18 RHC though cardiac output still marginal.  Repeat TEE 06/28/2017: EF 45%, severe aortic insufficiency, mild to moderate AS, mild mitral valve stenosis s/p MV repair with trivial MR. She had TAVR in 7/19, feeling much better since then.  Echo today showed stable bioprosthetic aortic valve, s/p mitral valve repair without significant stenosis or regurgitation.  - She will need antibiotics with dental work.  - continue ASA 81 daily.  2. Chronic systolic CHF:  Nonischemic cardiomyopathy (no significant disease on 2/18 LHC).  It is possible that this cardiomyopathy is driven by her valvular disease. Echo (8/18) with EF 30-35% RV mildly dilated. TEE 06/28/2017: EF 45%, severe aortic insufficiency, mild to moderate AS, mild mitral valve stenosis s/p MV repair with only trivial MR. Post-TAVR echo showed EF down to 30-35%, and echo in 2/20 showed EF stable in 30-35% range.  This may reflect myocardial dysfunction from prior long-standing aortic insufficiency.  NYHA class II symptoms.  She does not look volume overloaded on  exam and weight is down.  NYHA class I-II symptoms.     - Continue Lasix 40 mg daily - Add Farxiga 10 mg daily, BMET today and 10 days.   - Continue Entresto 24/26 bid.    -  Bidil stopped with hypotension - Continue spironolactone 25 mg daily. Can decrease KCl to 20 daily.  - Continue Coreg 6.25 mg bid.   - Reassess EF by echo.  If EF<35%, she will be ICD candidate.  3. CKD stage II-III:  - BMET today.  4. PAD: Occluded right EIA likely due to TAVR vascular access complication.  Really having minimal right leg claudication (had collaterals on CTA).    - Followup with [facility].  5. Hyperlipidemia: On Crestor.  - Lipids today.   Followup in 3 months with echo.   Loralie Champagne, MD 01/05/2021

## 2021-01-05 NOTE — Telephone Encounter (Signed)
Advanced Heart Failure Patient Advocate Encounter  Prior Authorization for [facility] has been submitted and approved.    PA# [id number] Effective dates: 01/05/21 through 01/05/22  Charlann Boxer, CPhT

## 2021-01-06 ENCOUNTER — Telehealth (HOSPITAL_COMMUNITY): Payer: Self-pay

## 2021-01-06 MED ORDER — ROSUVASTATIN CALCIUM 20 MG PO TABS: 20.0000 mg | ORAL_TABLET | Freq: Every day | ORAL | 11 refills | Status: DC

## 2021-01-06 NOTE — Telephone Encounter (Signed)
-----   Message from Larey Dresser, MD sent at 01/05/2021  2:41 PM EDT ----- LDL very high.  Restart Crestor 20 mg daily with lipids/LFTs in 2 months.

## 2021-01-06 NOTE — Telephone Encounter (Signed)
Patient advised and verbalized understanding. Rx sent, will recheck patients labs at next appt.  Meds ordered this encounter  Medications   rosuvastatin (CRESTOR) 20 MG tablet    Sig: Take 1 tablet (20 mg total) by mouth daily.    Dispense:  30 tablet    Refill:  11

## 2021-01-14 ENCOUNTER — Ambulatory Visit (HOSPITAL_COMMUNITY)
Admission: RE | Admit: 2021-01-14 | Discharge: 2021-01-14 | Disposition: A | Discharge status: Home / Self Care | Payer: Medicaid Other | Source: Ambulatory Visit | Attending: Cardiology | Admitting: Cardiology

## 2021-01-14 ENCOUNTER — Other Ambulatory Visit: Payer: Self-pay

## 2021-01-14 DIAGNOSIS — I5022 Chronic systolic (congestive) heart failure: Secondary | ICD-10-CM | POA: Diagnosis present

## 2021-01-14 LAB — BASIC METABOLIC PANEL
Anion gap: 9 (ref 5–15)
BUN: 25 mg/dL — ABNORMAL HIGH (ref 6–20)
CO2: 28 mmol/L (ref 22–32)
Calcium: 9.1 mg/dL (ref 8.9–10.3)
Chloride: 98 mmol/L (ref 98–111)
Creatinine, Ser: 1.65 mg/dL — ABNORMAL HIGH (ref 0.44–1.00)
GFR, Estimated: 36 mL/min — ABNORMAL LOW (ref >60)
Glucose, Bld: 154 mg/dL — ABNORMAL HIGH (ref 70–99)
Potassium: 3.8 mmol/L (ref 3.5–5.1)
Sodium: 135 mmol/L (ref 135–145)

## 2021-01-15 ENCOUNTER — Other Ambulatory Visit (HOSPITAL_COMMUNITY): Payer: Self-pay | Admitting: Cardiology

## 2021-02-22 ENCOUNTER — Other Ambulatory Visit (HOSPITAL_COMMUNITY): Payer: Self-pay | Admitting: *Deleted

## 2021-02-22 MED ORDER — CARVEDILOL 6.25 MG PO TABS: ORAL_TABLET | ORAL | 11 refills | Status: DC

## 2021-02-24 ENCOUNTER — Other Ambulatory Visit (HOSPITAL_COMMUNITY): Payer: Self-pay | Admitting: Cardiology

## 2021-04-09 ENCOUNTER — Ambulatory Visit (HOSPITAL_COMMUNITY)
Admission: RE | Admit: 2021-04-09 | Discharge: 2021-04-09 | Disposition: A | Discharge status: Home / Self Care | Payer: Medicaid Other | Source: Ambulatory Visit | Attending: Cardiology | Admitting: Cardiology

## 2021-04-09 ENCOUNTER — Ambulatory Visit (HOSPITAL_BASED_OUTPATIENT_CLINIC_OR_DEPARTMENT_OTHER)
Admission: RE | Admit: 2021-04-09 | Discharge: 2021-04-09 | Disposition: A | Discharge status: Home / Self Care | Payer: Medicaid Other | Source: Ambulatory Visit | Attending: Cardiology | Admitting: Cardiology

## 2021-04-09 ENCOUNTER — Other Ambulatory Visit: Payer: Self-pay

## 2021-04-09 ENCOUNTER — Encounter (HOSPITAL_COMMUNITY): Payer: Self-pay | Admitting: Cardiology

## 2021-04-09 VITALS — BP 104/60 | HR 65 | Wt 195.4 lb

## 2021-04-09 DIAGNOSIS — I739 Peripheral vascular disease, unspecified: Secondary | ICD-10-CM | POA: Insufficient documentation

## 2021-04-09 DIAGNOSIS — I272 Pulmonary hypertension, unspecified: Secondary | ICD-10-CM | POA: Insufficient documentation

## 2021-04-09 DIAGNOSIS — Z953 Presence of xenogenic heart valve: Secondary | ICD-10-CM | POA: Diagnosis not present

## 2021-04-09 DIAGNOSIS — Z952 Presence of prosthetic heart valve: Secondary | ICD-10-CM | POA: Diagnosis not present

## 2021-04-09 DIAGNOSIS — Z87891 Personal history of nicotine dependence: Secondary | ICD-10-CM | POA: Insufficient documentation

## 2021-04-09 DIAGNOSIS — Z09 Encounter for follow-up examination after completed treatment for conditions other than malignant neoplasm: Secondary | ICD-10-CM | POA: Diagnosis not present

## 2021-04-09 DIAGNOSIS — E785 Hyperlipidemia, unspecified: Secondary | ICD-10-CM | POA: Insufficient documentation

## 2021-04-09 DIAGNOSIS — Z560 Unemployment, unspecified: Secondary | ICD-10-CM | POA: Diagnosis not present

## 2021-04-09 DIAGNOSIS — I5042 Chronic combined systolic (congestive) and diastolic (congestive) heart failure: Secondary | ICD-10-CM | POA: Diagnosis not present

## 2021-04-09 DIAGNOSIS — Z79899 Other long term (current) drug therapy: Secondary | ICD-10-CM | POA: Diagnosis not present

## 2021-04-09 DIAGNOSIS — I080 Rheumatic disorders of both mitral and aortic valves: Secondary | ICD-10-CM | POA: Insufficient documentation

## 2021-04-09 DIAGNOSIS — I130 Hypertensive heart and chronic kidney disease with heart failure and stage 1 through stage 4 chronic kidney disease, or unspecified chronic kidney disease: Secondary | ICD-10-CM | POA: Diagnosis not present

## 2021-04-09 DIAGNOSIS — I428 Other cardiomyopathies: Secondary | ICD-10-CM | POA: Diagnosis not present

## 2021-04-09 DIAGNOSIS — I5022 Chronic systolic (congestive) heart failure: Secondary | ICD-10-CM

## 2021-04-09 DIAGNOSIS — I062 Rheumatic aortic stenosis with insufficiency: Secondary | ICD-10-CM | POA: Diagnosis not present

## 2021-04-09 DIAGNOSIS — N182 Chronic kidney disease, stage 2 (mild): Secondary | ICD-10-CM | POA: Diagnosis not present

## 2021-04-09 DIAGNOSIS — Z7982 Long term (current) use of aspirin: Secondary | ICD-10-CM | POA: Diagnosis not present

## 2021-04-09 LAB — COMPREHENSIVE METABOLIC PANEL
ALT: 20 U/L (ref 0–44)
AST: 25 U/L (ref 15–41)
Albumin: 3.9 g/dL (ref 3.5–5.0)
Alkaline Phosphatase: 89 U/L (ref 38–126)
Anion gap: 7 (ref 5–15)
BUN: 19 mg/dL (ref 6–20)
CO2: 24 mmol/L (ref 22–32)
Calcium: 9.2 mg/dL (ref 8.9–10.3)
Chloride: 103 mmol/L (ref 98–111)
Creatinine, Ser: 1.21 mg/dL — ABNORMAL HIGH (ref 0.44–1.00)
GFR, Estimated: 52 mL/min — ABNORMAL LOW (ref >60)
Glucose, Bld: 97 mg/dL (ref 70–99)
Potassium: 4.3 mmol/L (ref 3.5–5.1)
Sodium: 134 mmol/L — ABNORMAL LOW (ref 135–145)
Total Bilirubin: 0.5 mg/dL (ref 0.3–1.2)
Total Protein: 7.5 g/dL (ref 6.5–8.1)

## 2021-04-09 LAB — ECHOCARDIOGRAM COMPLETE
AR max vel: 0.4 cm2
AV Area VTI: 0.44 cm2
AV Area mean vel: 0.4 cm2
AV Mean grad: 29 mmHg
AV Peak grad: 54.8 mmHg
Ao pk vel: 3.7 m/s
Area-P 1/2: 3.03 cm2
MV VTI: 0.79 cm2
S' Lateral: 4.4 cm

## 2021-04-09 LAB — LIPID PANEL
Cholesterol: 94 mg/dL (ref 0–200)
HDL: 33 mg/dL — ABNORMAL LOW (ref >40)
LDL Cholesterol: 30 mg/dL (ref 0–99)
Total CHOL/HDL Ratio: 2.8 RATIO
Triglycerides: 157 mg/dL — ABNORMAL HIGH (ref <150)
VLDL: 31 mg/dL (ref 0–40)

## 2021-04-09 MED ORDER — FUROSEMIDE 40 MG PO TABS: ORAL_TABLET | ORAL | 6 refills | Status: DC

## 2021-04-09 MED ORDER — PERFLUTREN LIPID MICROSPHERE
1.0000 mL | INTRAVENOUS | Status: DC | PRN
  Administered 2021-04-09: 8 mL via INTRAVENOUS
  Filled 2021-04-09: qty 10

## 2021-04-09 NOTE — Patient Instructions (Signed)
Labs done today, your results will be available in MyChart, we will contact you for abnormal readings.  Non-Cardiac CT Angiography (CTA), is a special type of CT scan that uses a computer to produce multi-dimensional views of major blood vessels throughout the body. In CT angiography, a contrast material is injected through an IV to help visualize the blood vessels. ONCE APPROVED BY YOUR INSURANCE WE WILL CALL YOU TO SCHEDULE THIS  Your physician recommends that you schedule a follow-up appointment in: 3 months  If you have any questions or concerns before your next appointment please send [place] a message through [place] or call our office at [phone].    TO LEAVE A MESSAGE FOR THE NURSE SELECT OPTION 2, PLEASE LEAVE A MESSAGE INCLUDING: YOUR NAME DATE OF BIRTH CALL BACK NUMBER REASON FOR CALL**this is important as we prioritize the call backs  YOU WILL RECEIVE A CALL BACK THE SAME DAY AS LONG AS YOU CALL BEFORE 4:00 PM  At the [facility], you and your health needs are our priority. As part of our continuing mission to provide you with exceptional heart care, we have created designated Provider Care Teams. These Care Teams include your primary Cardiologist (physician) and Advanced Practice Providers (APPs- Physician Assistants and Nurse Practitioners) who all work together to provide you with the care you need, when you need it.   You may see any of the following providers on your designated Care Team at your next follow up: Dr Glori Bickers Dr Haynes Kerns, NP Lyda Jester, [place] [facility] [place], [place] Audry Riles, PharmD   Please be sure to bring in all your medications bottles to every appointment.

## 2021-04-09 NOTE — Progress Notes (Signed)
Advanced Heart Failure Clinic Note   PCP: Roe Coombs Cardiology: Dr. Ellyn Hack HF Cardiology: Dr. Aundra Dubin  60 y.o. 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  She had aortic and mitral repairs in 2010.  Afterwards, EF was mildly low in the 40-45% range.  However, EF had dropped to 25-30% on 1/18 echo.  TEE showed severe MR, moderate AI, moderate aortic stenosis.  RHC/LHC in 2/18 showed significantly elevated LV and RV filling pressures.    At her initial appointment here, she was very dyspneic with exertion, with orthopnea and PND. Her meds were adjusted and Lasix increased.  She has seen Dr. Roxy Manns since that time for evaluation for redo valve surgery.  She would require high risk mitral and aortic valve replacements. For now, the plan will be close monitoring with surgery if starts to decline or has CHF exacerbation. CPX was done in 3/18 showing moderate to severely decreased functional capacity.  RHC in 4/18 showed optimized filling pressures but low cardiac output.  Repeat echo in 8/18 showed EF 30-35%, severe AI with moderate AS but only mild MR noted.  The RV was mildly dilated with mildly decreased systolic function.   TEE 06/28/2017: EF 45%, severe AI, mild to moderate AS, mild mitral valve stenosis s/p MV repair  In 7/19, she had TAVR with #23 Edwards Sapien 3 THV.  Echo post-op in 7/19 showed EF 30-35%, moderate LVH, bioprosthetic aortic valve looked ok, s/p MV repair with mild mitral stenosis.   She developed pain in her right leg and was found to have occluded right EIA on dopplers in 9/19, confirmed by CTA. Suspect this was a vascular access complication of TAVR.  She was seen by Dr. Donnetta Hutching, conservative treatment for now.   Echo in 2/20 showed that EF remains 30-35%, stable TAVR valve, s/p MVR repair with no regurgitation or significant stenosis.   Echo 6/21 showed EF 40-45%, mild LVH, normal RV, stable MVR, MV  mean gradient 3.0 mmHg  Today she returns for HF follow up. Overall feeling fine. She does not have significant exertional dyspnea with activity. Denies CP, dizziness, edema, or PND/Orthopnea. Appetite ok. No fever or chills. Weight stable at home. Taking all medications.   Echo today, results pending.  Labs (2/18): K 3.8 => 3.4, creatinine 1.09 => 1.07, hgb 12.7, BNP 1010 Labs (3/18): K 4.6, creatinine 2.43 => 1.74 Labs (4/18): K 4, creatinine 1.28, digoxin 0.3 Labs 09/2016: K 4.4, creatinine 1.26.  Labs 01/19/2017: K 4.4, Creatinine 1.21, digoxin 0.3 Labs (10/18): K 4.7, creatinine 1.10, digoxin 0.3 Labs (12/18): K 4.9, creatinine 1.34, digoxin < 0.2 Labs (8/19): K 3.9, creatinine 1.33 Labs (9/19): K 3.7, creatinine 1.46 Labs (7/21): K 4.9, creatinine 1.38 Labs (8/22): K 3.8, creatinine 1.65, LDL 174  PMH: 1. HTN 2. Hypothyroidism 3. Hyperlipidemia 4. Rheumatic heart disease: s/p MV repair and aortic valve repair in 2010, Dr. Roxy Manns. - TEE (2/18): EF 35-30%, dilated LV, rheumatic-appearing aortic valve s/p repair with moderate AS (AVA 1.1 cm^2), moderate AI.  MV s/p repair with mild-moderate mitral stenosis and eccentric severe MR.   - Echo (8/18): Severe AI but only moderate AS, MR was only mild on this study (may have missed full jet) with mean MV gradient 4 mmHg.  - TEE 06/28/2017: EF 45%, severe AI, mild to moderate AS, mild mitral valve stenosis s/p MV repair - In 7/19, she had TAVR with #23 Berniece Pap  3 THV.   - Echo post-op in 7/19 showed EF 30-35%, moderate LVH, bioprosthetic aortic valve looked ok, s/p MV repair with mild mitral stenosis.  - Echo in 11/22 showed TAVR valve with significant stenosis and regurgitation => mild-moderate PVL with mean gradient 29 mmHg and DI 0.22.  There was mild stenosis of the repaired mitral valve.  5. Chronic systolic CHF: Nonischemic cardiomyopathy, probably related to valvular disease.   - Echo (11/15): EF 40-45%, - Echo (12/16): EF  35-40%. - Echo (1/18): EF 25-30%.  - LHC/RHC (2/18): Normal coronaries, mean RA 18, mean PA 74/39 mean 52, mean PCWP 31, CI 1.98 Fick. - CPX (3/18): peak VO2 11.7, VE/VCO2 slope 40, RER 1.11 => moderate to severely decreased functional capacity due to HF.  - RHC (4/18): mean RA 4, PA 44/17 mean 28, mean PCWP 15, CI 1.73, PVR 4 WU.  - Echo (8/18): EF 30-35%, severe AI with moderate AS but only mild MR noted with mean MV gradient 4 mmHg.  The RV was mildly dilated with mildly decreased systolic function. - TEE 9/60: EF 45%, severe aortic insufficiency, mild to moderate AS, mild mitral valve stenosis s/p MV repair - Coronary CTA (5/19): No obstructive coronary disease.  - Echo (7/19): EF 30-35%, moderate LVH, bioprosthetic aortic valve looked ok, s/p MV repair with mild mitral stenosis.  - Echo (2/20): EF 30-35%, moderate LVH with diffuse hypokinesis, mildly decreased RV systolic function, bioprosthetic aortic valve s/p TAVR without significant stenosis or regurgitation, s/p MV repair with mean gradient 3 mmHg and no MR.  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  6. Carotid dopplers (6/19): No significant disease.  7. PAD: 9/19 peripheral arterial dopplers showed total occluded proximal right external iliac artery.  - CTA confirmed total occlusion of R EIA with collaterals.  Suspect TAVR vascular access complication.   Social History   Socioeconomic History   Marital status: Single    Spouse name: Not on file   Number of children: Not on file   Years of education: Not on file   Highest education level: Not on file  Occupational History   Not on file  Tobacco Use   Smoking status: Former    Packs/day: 0.10    Years: 15.00    Pack years: 1.50    Types: Cigarettes    Quit date: 2011     Years since quitting: 11.8   Smokeless tobacco: Never  Vaping Use   Vaping Use: Never used  Substance and Sexual Activity   Alcohol use: Never   Drug use: Never   Sexual activity: Not Currently  Other Topics Concern   Not on file  Social History Narrative   Unemployed, on disability.   Now is finally reestablished with a PCP.   She is a former smoker, quit in 2011. Denies alcohol consumption   Tries to walk routinely.   Social Determinants of Health   Financial Resource Strain: Not on file  Food Insecurity: Not on file  Transportation Needs: Not on file  Physical Activity: Not on file  Stress: Not on file  Social Connections: Not on file  Intimate Partner Violence: Not on file   Family History  Problem Relation Age of Onset   Heart disease Mother    CVA Mother    Review of systems  complete and found to be negative unless listed in HPI.    Current Outpatient Medications  Medication Sig Dispense Refill   acetaminophen (TYLENOL) 325 MG tablet Take 650 mg every 6 (six) hours as needed by mouth for mild pain.     albuterol (PROVENTIL HFA;VENTOLIN HFA) 108 (90 BASE) MCG/ACT inhaler Inhale 2 puffs into the lungs every 6 (six) hours as needed for shortness of breath.      aspirin 81 MG chewable tablet Chew 1 tablet (81 mg total) by mouth daily. 30 tablet 3   beclomethasone (QVAR) 40 MCG/ACT inhaler Inhale 2 puffs into the lungs daily as needed (shortness of breath).      carvedilol (COREG) 6.25 MG tablet TAKE 1 TABLET(6.25 MG) BY MOUTH TWICE DAILY 60 tablet 11   dapagliflozin propanediol (FARXIGA) 10 MG TABS tablet Take 1 tablet (10 mg total) by mouth daily before breakfast. 30 tablet 11   furosemide (LASIX) 40 MG tablet TAKE 1 TABLET BY MOUTH EVERY AM AND TAKE 1/2 TABLET IN THE EVENING 45 tablet 1   levothyroxine (SYNTHROID) 125 MCG tablet Take 0.5 mcg by mouth daily before breakfast. 1/2 tablet on Sunday     levothyroxine (SYNTHROID, LEVOTHROID) 125 MCG tablet Take 125 mcg by  mouth daily before breakfast.   0   potassium chloride (KLOR-CON) 10 MEQ tablet Take 2 tablets (20 mEq total) by mouth daily. 180 tablet 3   rosuvastatin (CRESTOR) 20 MG tablet Take 1 tablet (20 mg total) by mouth daily. 30 tablet 11   spironolactone (ALDACTONE) 25 MG tablet TAKE 1 TABLET(25 MG) BY MOUTH DAILY 30 tablet 5   No current facility-administered medications for this encounter.   BP 104/60   Pulse 65   Wt 88.6 kg (195 lb 6.4 oz)   SpO2 96%   BMI 42.28 kg/m   Wt Readings from Last 3 Encounters:  04/09/21 88.6 kg (195 lb 6.4 oz)  01/04/21 89.1 kg (196 lb 6.4 oz)  01/23/20 92.1 kg (203 lb)   Physical Exam  General:  NAD. No resp difficulty HEENT: Normal Neck: Supple. Thick neck. Carotids 2+ bilat; no bruits. No lymphadenopathy or thryomegaly appreciated. Cor: PMI nondisplaced. Regular rate & rhythm. No rubs, gallops, 2/6 SEM RUSB Lungs: Clear Abdomen: Obese, nontender, nondistended. No hepatosplenomegaly. No bruits or masses. Good bowel sounds. Extremities: No cyanosis, clubbing, rash, edema Neuro: Alert & oriented x 3, cranial nerves grossly intact. Moves all 4 extremities w/o difficulty. Affect pleasant.  Assessment/Plan: 1. 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  She had aortic and mitral valve repairs in 2010.   It was felt that she would not be a very good Mitraclip candidate as she already has a degree of mitral stenosis.  TEE in 2/18 showed moderate AS/moderate AI , mild to moderate MS, severe eccentric MR.  Echo in 8/18 showed moderate AS/severe AI but only mild mitral stenosis and mild MR noted. Hemodynamics looked better on 4/18 RHC though cardiac output still marginal.  Repeat TEE 06/28/2017: EF 45%, severe aortic insufficiency, mild to moderate AS, mild mitral valve stenosis s/p MV repair with trivial MR. She had TAVR in 7/19, feeling much better since then.  Echo 6/21 showed stable bioprosthetic aortic valve, s/p mitral valve repair without significant stenosis or  regurgitation. Echo today,? partial-valve thrombosis, elevated mean gradient 29 mm Hg, previously 22 mm HG 6/21. Will get CT to better visualize aortic valve. - She will need antibiotics with dental work.  - Continue ASA 81 daily.  2. Chronic systolic CHF:  Nonischemic cardiomyopathy (no significant disease on 2/18 LHC).  It is possible that this cardiomyopathy is driven by her valvular disease. Echo (8/18) with EF 30-35% RV mildly dilated. TEE 06/28/2017: EF 45%, severe aortic insufficiency, mild to moderate AS, mild mitral valve stenosis s/p MV repair with only trivial MR. Post-TAVR echo showed EF down to 30-35%, and echo in 2/20 showed EF stable in 30-35% range.  This may reflect myocardial dysfunction from prior long-standing aortic insufficiency.  Echo 6/21 showed EF 40-45%. NYHA class II symptoms.  She does not look volume overloaded on exam and weight is down.  NYHA class I-II symptoms.     - Continue Lasix 40 mg daily. - Continue Farxiga 10 mg daily, BMET today.  - Continue Entresto 24/26 mg bid.    - Continue spironolactone 25 mg daily.  - Continue Coreg 6.25 mg bid.   - Bidil stopped with hypotension. - Not a ICD candidate with improved EF.  3. CKD stage II-III: BMET today.  4. PAD: Occluded right EIA likely due to TAVR vascular access complication.  Really having minimal right leg claudication (had collaterals on CTA).    - Followup with [facility].  5. Hyperlipidemia: LDL 174 on 8/22. On Crestor. - Lipids + LFTs  today.   Followup with Dr. Aundra Dubin in 3 months.  [place], [place] 04/09/2021  Patient seen with NP, agree with the above note.   Symptomatically, she is feeling well with minimal dyspnea.  Weight down 1 lb.   I reviewed her echo that was done today. 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   General: NAD Neck: No JVD, no thyromegaly or thyroid nodule.  Lungs: Clear to auscultation bilaterally with normal respiratory effort. CV: Nondisplaced PMI.  Heart regular S1/S2, no S3/S4, 2/6 SEM RUSB.  No peripheral edema.  No carotid bruit.  Normal pedal pulses.  Abdomen: Soft, nontender, no hepatosplenomegaly, no distention.  Skin: Intact without lesions or rashes.  Neurologic: Alert and oriented x 3.  Psych: Normal affect. Extremities: No clubbing or cyanosis.  HEENT: Normal.   Symptomatically doing well, NYHA class I-II.  However, I am concerned by her echo.  There there is mild-moderate peri-valvular leakage at the bioprosthetic aortic valve (s/p TAVR) with mean gradient elevated at 29 mmHg and DI 0.22.  I am concerned for possible partial valve thrombosis.   - I will set her up for gated CT to assess the aortic valve.  Could consider TEE if this is not definitive.   Check LDL today on Crestor.   Loralie Champagne 04/11/2021

## 2021-04-09 NOTE — Progress Notes (Signed)
  Echocardiogram 2D Echocardiogram has been performed.  Johny Chess 04/09/2021, 4:08 PM

## 2021-04-15 ENCOUNTER — Other Ambulatory Visit (HOSPITAL_COMMUNITY): Payer: Self-pay | Admitting: Cardiology

## 2021-06-14 ENCOUNTER — Other Ambulatory Visit (HOSPITAL_COMMUNITY): Payer: Self-pay | Admitting: Cardiology

## 2021-07-06 NOTE — Progress Notes (Signed)
Advanced Heart Failure Clinic Note   PCP: Aura Dials, PA-C Cardiology: Dr. Ellyn Hack HF Cardiology: Dr. Aundra Dubin  61 y.o. 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  She had aortic and mitral repairs in 2010.  Afterwards, EF was mildly low in the 40-45% range.  However, EF had dropped to 25-30% on 1/18 echo.  TEE showed severe MR, moderate AI, moderate aortic stenosis.  RHC/LHC in 2/18 showed significantly elevated LV and RV filling pressures.    At her initial appointment here, she was very dyspneic with exertion, with orthopnea and PND. Her meds were adjusted and Lasix increased.  She has seen Dr. Roxy Manns since that time for evaluation for redo valve surgery.  She would require high risk mitral and aortic valve replacements. For now, the plan will be close monitoring with surgery if starts to decline or has CHF exacerbation. CPX was done in 3/18 showing moderate to severely decreased functional capacity.  RHC in 4/18 showed optimized filling pressures but low cardiac output.  Repeat echo in 8/18 showed EF 30-35%, severe AI with moderate AS but only mild MR noted.  The RV was mildly dilated with mildly decreased systolic function.   TEE 06/28/2017: EF 45%, severe AI, mild to moderate AS, mild mitral valve stenosis s/p MV repair  In 7/19, she had TAVR with #23 Edwards Sapien 3 THV.  Echo post-op in 7/19 showed EF 30-35%, moderate LVH, bioprosthetic aortic valve looked ok, s/p MV repair with mild mitral stenosis.   She developed pain in her right leg and was found to have occluded right EIA on dopplers in 9/19, confirmed by CTA. Suspect this was a vascular access complication of TAVR.  She was seen by Dr. Donnetta Hutching, conservative treatment for now.   Echo in 2/20 showed that EF remains 30-35%, stable TAVR valve, s/p MVR repair with no regurgitation or significant stenosis.   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 There was concern for possible partial valve thrombosis and gated CT arranged to assess aortic valve.  Today she returns for HF follow up. Overall feeling fine. She does not have significant exertional dyspnea. Denies palpitations, abnormal bleeding, CP, dizziness, edema, or PND/Orthopnea. Appetite ok. No fever or chills. Weight at home 193 pounds. Taking all medications. Has not scheduled CT.   Labs (2/18): K 3.8 => 3.4, creatinine 1.09 => 1.07, hgb 12.7, BNP 1010 Labs (3/18): K 4.6, creatinine 2.43 => 1.74 Labs (4/18): K 4, creatinine 1.28, digoxin 0.3 Labs 09/2016: K 4.4, creatinine 1.26.  Labs 01/19/2017: K 4.4, Creatinine 1.21, digoxin 0.3 Labs (10/18): K 4.7, creatinine 1.10, digoxin 0.3 Labs (12/18): K 4.9, creatinine 1.34, digoxin < 0.2 Labs (8/19): K 3.9, creatinine 1.33 Labs (9/19): K 3.7, creatinine 1.46 Labs (7/21): K 4.9, creatinine 1.38 Labs (8/22): K 3.8, creatinine 1.65, LDL 174 Labs (11/22): K 4.3, creatinine 1.21, LDL 30, LFTs ok  PMH: 1. HTN 2. Hypothyroidism 3. Hyperlipidemia 4. Rheumatic heart disease: s/p MV repair and aortic valve repair in 2010, Dr. Roxy Manns. - TEE (2/18): EF 35-30%, dilated LV, rheumatic-appearing aortic valve s/p repair with moderate AS (AVA 1.1 cm^2), moderate AI.  MV  s/p repair with mild-moderate mitral stenosis and eccentric severe MR.   - Echo (8/18): Severe AI but only moderate AS, MR was only mild on this study (may have missed full jet) with mean MV gradient 4 mmHg.  - TEE 06/28/2017: EF 45%, severe AI, mild to moderate AS, mild mitral valve stenosis s/p MV repair - In 7/19, she had TAVR with #23 Edwards Sapien 3 THV.   - Echo post-op in 7/19 showed EF 30-35%, moderate  LVH, bioprosthetic aortic valve looked ok, s/p MV repair with mild mitral stenosis.  - Echo in 11/22 showed TAVR valve with significant stenosis and regurgitation => mild-moderate PVL with mean gradient 29 mmHg and DI 0.22.  There was mild stenosis of the repaired mitral valve.  5. Chronic systolic CHF: Nonischemic cardiomyopathy, probably related to valvular disease.   - Echo (11/15): EF 40-45%, - Echo (12/16): EF 35-40%. - Echo (1/18): EF 25-30%.  - LHC/RHC (2/18): Normal coronaries, mean RA 18, mean PA 74/39 mean 52, mean PCWP 31, CI 1.98 Fick. - CPX (3/18): peak VO2 11.7, VE/VCO2 slope 40, RER 1.11 => moderate to severely decreased functional capacity due to HF.  - RHC (4/18): mean RA 4, PA 44/17 mean 28, mean PCWP 15, CI 1.73, PVR 4 WU.  - Echo (8/18): EF 30-35%, severe AI with moderate AS but only mild MR noted with mean MV gradient 4 mmHg.  The RV was mildly dilated with mildly decreased systolic function. - TEE 2/20: EF 45%, severe aortic insufficiency, mild to moderate AS, mild mitral valve stenosis s/p MV repair - Coronary CTA (5/19): No obstructive coronary disease.  - Echo (7/19): EF 30-35%, moderate LVH, bioprosthetic aortic valve looked ok, s/p MV repair with mild mitral stenosis.  - Echo (2/20): EF 30-35%, moderate LVH with diffuse hypokinesis, mildly decreased RV systolic function, bioprosthetic aortic valve s/p TAVR without significant stenosis or regurgitation, s/p MV repair with mean gradient 3 mmHg and no MR.  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  6. Carotid dopplers (6/19): No significant disease.  7. PAD: 9/19 peripheral arterial dopplers showed total occluded proximal right external iliac artery.  - CTA confirmed total occlusion of R EIA with collaterals.  Suspect  TAVR vascular access complication.   Social History   Socioeconomic History   Marital status: Single    Spouse name: Not on file   Number of children: Not on file   Years of education: Not on file   Highest education level: Not on file  Occupational History   Not on file  Tobacco Use   Smoking status: Former    Packs/day: 0.10    Years: 15.00    Pack years: 1.50    Types: Cigarettes    Quit date: 2011    Years since quitting: 12.1   Smokeless tobacco: Never  Vaping Use   Vaping Use: Never used  Substance and Sexual Activity   Alcohol use: Never   Drug use: Never   Sexual activity: Not Currently  Other Topics Concern   Not on file  Social History Narrative   Unemployed, on disability.   Now is finally reestablished with a PCP.   She is a former smoker, quit in 2011. Denies alcohol consumption   Tries to walk routinely.   Social Determinants of Health  Financial Resource Strain: Not on file  Food Insecurity: Not on file  Transportation Needs: Not on file  Physical Activity: Not on file  Stress: Not on file  Social Connections: Not on file  Intimate Partner Violence: Not on file   Family History  Problem Relation Age of Onset   Heart disease Mother    CVA Mother    Review of systems complete and found to be negative unless listed in HPI.    Current Outpatient Medications  Medication Sig Dispense Refill   acetaminophen (TYLENOL) 325 MG tablet Take 650 mg every 6 (six) hours as needed by mouth for mild pain.     albuterol (PROVENTIL HFA;VENTOLIN HFA) 108 (90 BASE) MCG/ACT inhaler Inhale 2 puffs into the lungs every 6 (six) hours as needed for shortness of breath.      aspirin 81 MG chewable tablet Chew 1 tablet (81 mg total) by mouth daily. 30 tablet 3   beclomethasone (QVAR) 40 MCG/ACT inhaler Inhale 2 puffs into the lungs daily as needed (shortness of breath).      carvedilol (COREG) 6.25 MG tablet TAKE 1 TABLET(6.25 MG) BY MOUTH TWICE DAILY 60 tablet 11    dapagliflozin propanediol (FARXIGA) 10 MG TABS tablet Take 1 tablet (10 mg total) by mouth daily before breakfast. 30 tablet 11   furosemide (LASIX) 40 MG tablet TAKE 1 TABLET EVERY MORNING AND 1/2 TABLET EVERY EVENING 45 tablet 6   levothyroxine (SYNTHROID) 125 MCG tablet Patient takes 1 tablet by mouth on Monday Tuesday Wednesday Thursday Friday Saturday and 1/2 tablets on Sundays.     potassium chloride (KLOR-CON) 10 MEQ tablet Take 2 tablets (20 mEq total) by mouth daily. 180 tablet 3   rosuvastatin (CRESTOR) 20 MG tablet Take 1 tablet (20 mg total) by mouth daily. 30 tablet 11   sacubitril-valsartan (ENTRESTO) 24-26 MG Take 1 tablet by mouth 2 (two) times daily.     spironolactone (ALDACTONE) 25 MG tablet TAKE 1 TABLET(25 MG) BY MOUTH DAILY 30 tablet 5   No current facility-administered medications for this encounter.   BP (!) 122/98    Pulse 76    Wt 87.9 kg (193 lb 12.8 oz)    SpO2 100%    BMI 41.94 kg/m   Wt Readings from Last 3 Encounters:  07/07/21 87.9 kg (193 lb 12.8 oz)  04/09/21 88.6 kg (195 lb 6.4 oz)  01/04/21 89.1 kg (196 lb 6.4 oz)   Physical Exam  General:  NAD. No resp difficulty HEENT: Normal Neck: Supple. No JVD. Carotids 2+ bilat; no bruits. No lymphadenopathy or thryomegaly appreciated. Cor: PMI nondisplaced. Regular rate & rhythm. No rubs, gallops, 2/6 SEM RUSB Lungs: Clear Abdomen: Soft, nontender, nondistended. No hepatosplenomegaly. No bruits or masses. Good bowel sounds. Extremities: No cyanosis, clubbing, rash, edema Neuro: Alert & oriented x 3, cranial nerves grossly intact. Moves all 4 extremities w/o difficulty. Affect pleasant.  Assessment/Plan: 1. 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  She had aortic and mitral valve repairs in 2010.   It was felt that she would not be a very good Mitraclip candidate as she already has a degree of mitral stenosis.  TEE in 2/18 showed moderate AS/moderate AI , mild to moderate MS, severe eccentric MR.  Echo in 8/18  showed moderate AS/severe AI but only mild mitral stenosis and mild MR noted. Hemodynamics looked better on 4/18 RHC though cardiac output still marginal.  Repeat TEE 06/28/2017: EF 45%, severe aortic insufficiency, mild to moderate AS, mild mitral valve stenosis  s/p MV repair with trivial MR. She had TAVR in 7/19, feeling much better since then.  Echo 6/21 showed stable bioprosthetic aortic valve, s/p mitral valve repair without significant stenosis or regurgitation. Echo 11/22,? partial-valve thrombosis, elevated mean gradient 29 mm Hg, previously 22 mm HG 6/21. Needs gated CT to better visualize aortic valve. This has not been scheduled, will see if PA required. - She will need antibiotics with dental work.  - Continue ASA 81 daily.  2. Chronic systolic CHF:  Nonischemic cardiomyopathy (no significant disease on 2/18 LHC).  It is possible that this cardiomyopathy is driven by her valvular disease. Echo (8/18) with EF 30-35% RV mildly dilated. TEE 06/28/2017: EF 45%, severe aortic insufficiency, mild to moderate AS, mild mitral valve stenosis s/p MV repair with only trivial MR. Post-TAVR echo showed EF down to 30-35%, and echo in 2/20 showed EF stable in 30-35% range.  This may reflect myocardial dysfunction from prior long-standing aortic insufficiency.  Echo 6/21 showed EF 40-45%. Echo (11/22): EF 35-40%. She does not look volume overloaded on exam and weight is down.  NYHA class I-II symptoms.     - Increase Entresto to 49/51 mg bid. BMET today, repeat in 10-14 days. - Decrease Lasix to 40 mg daily. - Continue Farxiga 10 mg daily. - Continue spironolactone 25 mg daily.  - Continue Coreg 6.25 mg bid.   - Bidil stopped with hypotension. - Not a ICD candidate with improved EF.  3. CKD stage III: BMET today.  4. PAD: Occluded right EIA likely due to TAVR vascular access complication.  Really having minimal right leg claudication (had collaterals on CTA).    - Followup with [facility].  5. Hyperlipidemia: LDL 30  11/22. On Crestor.  Followup with Dr. Aundra Dubin in 3 months.  [place],  07/07/2021

## 2021-07-07 ENCOUNTER — Encounter (HOSPITAL_COMMUNITY): Payer: Self-pay

## 2021-07-07 ENCOUNTER — Ambulatory Visit (HOSPITAL_COMMUNITY)
Admission: RE | Admit: 2021-07-07 | Discharge: 2021-07-07 | Disposition: A | Discharge status: Home / Self Care | Payer: Medicaid Other | Source: Ambulatory Visit | Attending: Family Medicine | Admitting: Family Medicine

## 2021-07-07 ENCOUNTER — Other Ambulatory Visit: Payer: Self-pay

## 2021-07-07 ENCOUNTER — Encounter (HOSPITAL_COMMUNITY): Payer: Medicaid Other | Admitting: Cardiology

## 2021-07-07 VITALS — BP 122/98 | HR 76 | Wt 193.8 lb

## 2021-07-07 DIAGNOSIS — I779 Disorder of arteries and arterioles, unspecified: Secondary | ICD-10-CM | POA: Diagnosis not present

## 2021-07-07 DIAGNOSIS — I5022 Chronic systolic (congestive) heart failure: Secondary | ICD-10-CM | POA: Insufficient documentation

## 2021-07-07 DIAGNOSIS — I080 Rheumatic disorders of both mitral and aortic valves: Secondary | ICD-10-CM | POA: Diagnosis present

## 2021-07-07 DIAGNOSIS — I428 Other cardiomyopathies: Secondary | ICD-10-CM | POA: Insufficient documentation

## 2021-07-07 DIAGNOSIS — E785 Hyperlipidemia, unspecified: Secondary | ICD-10-CM | POA: Diagnosis not present

## 2021-07-07 DIAGNOSIS — I739 Peripheral vascular disease, unspecified: Secondary | ICD-10-CM | POA: Insufficient documentation

## 2021-07-07 DIAGNOSIS — Z953 Presence of xenogenic heart valve: Secondary | ICD-10-CM | POA: Insufficient documentation

## 2021-07-07 DIAGNOSIS — I959 Hypotension, unspecified: Secondary | ICD-10-CM | POA: Diagnosis not present

## 2021-07-07 DIAGNOSIS — N1831 Chronic kidney disease, stage 3a: Secondary | ICD-10-CM

## 2021-07-07 DIAGNOSIS — Z7984 Long term (current) use of oral hypoglycemic drugs: Secondary | ICD-10-CM | POA: Insufficient documentation

## 2021-07-07 DIAGNOSIS — N183 Chronic kidney disease, stage 3 unspecified: Secondary | ICD-10-CM | POA: Insufficient documentation

## 2021-07-07 DIAGNOSIS — I5042 Chronic combined systolic (congestive) and diastolic (congestive) heart failure: Secondary | ICD-10-CM | POA: Diagnosis not present

## 2021-07-07 DIAGNOSIS — Z7982 Long term (current) use of aspirin: Secondary | ICD-10-CM | POA: Insufficient documentation

## 2021-07-07 DIAGNOSIS — Z79899 Other long term (current) drug therapy: Secondary | ICD-10-CM | POA: Insufficient documentation

## 2021-07-07 DIAGNOSIS — Z952 Presence of prosthetic heart valve: Secondary | ICD-10-CM | POA: Diagnosis not present

## 2021-07-07 DIAGNOSIS — I130 Hypertensive heart and chronic kidney disease with heart failure and stage 1 through stage 4 chronic kidney disease, or unspecified chronic kidney disease: Secondary | ICD-10-CM | POA: Diagnosis not present

## 2021-07-07 LAB — BASIC METABOLIC PANEL
Anion gap: 10 (ref 5–15)
BUN: 26 mg/dL — ABNORMAL HIGH (ref 6–20)
CO2: 22 mmol/L (ref 22–32)
Calcium: 9.4 mg/dL (ref 8.9–10.3)
Chloride: 101 mmol/L (ref 98–111)
Creatinine, Ser: 1.41 mg/dL — ABNORMAL HIGH (ref 0.44–1.00)
GFR, Estimated: 43 mL/min — ABNORMAL LOW (ref >60)
Glucose, Bld: 103 mg/dL — ABNORMAL HIGH (ref 70–99)
Potassium: 4.3 mmol/L (ref 3.5–5.1)
Sodium: 133 mmol/L — ABNORMAL LOW (ref 135–145)

## 2021-07-07 MED ORDER — ENTRESTO 49-51 MG PO TABS: 1.0000 | ORAL_TABLET | Freq: Two times a day (BID) | ORAL | 6 refills | Status: DC

## 2021-07-07 MED ORDER — FUROSEMIDE 40 MG PO TABS: 40.0000 mg | ORAL_TABLET | Freq: Every day | ORAL | 5 refills | Status: DC

## 2021-07-07 NOTE — Patient Instructions (Signed)
Thank you for coming in today  Labs were done today, if any labs are abnormal the clinic will call you  INCREASE Entresto to 49/51 mg 1 tablet twice daily   DECREASE Lasix to 40 mg 1 tablet daily   Your physician recommends that you schedule a follow-up appointment in: 3 months with Dr. Aundra Dubin  Your physician recommends that you return for lab work in:  10-14 days   At the [facility], you and your health needs are our priority. As part of our continuing mission to provide you with exceptional heart care, we have created designated Provider Care Teams. These Care Teams include your primary Cardiologist (physician) and Advanced Practice Providers (APPs- Physician Assistants and Nurse Practitioners) who all work together to provide you with the care you need, when you need it.   You may see any of the following providers on your designated Care Team at your next follow up: Dr Glori Bickers Dr Haynes Kerns, NP Lyda Jester, [place] [facility] [place], [place] Audry Riles, PharmD   Please be sure to bring in all your medications bottles to every appointment.   If you have any questions or concerns before your next appointment please send [place] a message through [place] or call our office at [phone].    TO LEAVE A MESSAGE FOR THE NURSE SELECT OPTION 2, PLEASE LEAVE A MESSAGE INCLUDING: YOUR NAME DATE OF BIRTH CALL BACK NUMBER REASON FOR CALL**this is important as we prioritize the call backs  YOU WILL RECEIVE A CALL BACK THE SAME DAY AS LONG AS YOU CALL BEFORE 4:00 PM

## 2021-07-21 ENCOUNTER — Other Ambulatory Visit: Payer: Self-pay

## 2021-07-21 ENCOUNTER — Ambulatory Visit (HOSPITAL_COMMUNITY)
Admission: RE | Admit: 2021-07-21 | Discharge: 2021-07-21 | Disposition: A | Discharge status: Home / Self Care | Payer: Medicaid Other | Source: Ambulatory Visit | Attending: Cardiology | Admitting: Cardiology

## 2021-07-21 DIAGNOSIS — I5042 Chronic combined systolic (congestive) and diastolic (congestive) heart failure: Secondary | ICD-10-CM

## 2021-09-27 ENCOUNTER — Telehealth (HOSPITAL_COMMUNITY): Payer: Self-pay | Admitting: Pharmacy Technician

## 2021-09-27 ENCOUNTER — Other Ambulatory Visit (HOSPITAL_COMMUNITY): Payer: Self-pay

## 2021-09-27 NOTE — Telephone Encounter (Signed)
Advanced Heart Failure Patient Advocate Encounter ? ?Prior Authorization for Delene Loll has been submitted and approved.   ? ?PA# [id number] ?Effective dates: 09/26/21 through 09/27/22 ? ?Charlann Boxer, CPhT ? ? ?

## 2021-10-07 ENCOUNTER — Encounter (HOSPITAL_COMMUNITY): Payer: Medicaid Other | Admitting: Cardiology

## 2021-10-11 ENCOUNTER — Ambulatory Visit (HOSPITAL_COMMUNITY)
Admission: RE | Admit: 2021-10-11 | Discharge: 2021-10-11 | Disposition: A | Discharge status: Home / Self Care | Payer: Medicaid Other | Source: Ambulatory Visit | Attending: Cardiology | Admitting: Cardiology

## 2021-10-11 VITALS — BP 130/82 | HR 75 | Wt 195.0 lb

## 2021-10-11 DIAGNOSIS — Z7982 Long term (current) use of aspirin: Secondary | ICD-10-CM | POA: Insufficient documentation

## 2021-10-11 DIAGNOSIS — N183 Chronic kidney disease, stage 3 unspecified: Secondary | ICD-10-CM | POA: Diagnosis not present

## 2021-10-11 DIAGNOSIS — E785 Hyperlipidemia, unspecified: Secondary | ICD-10-CM | POA: Diagnosis not present

## 2021-10-11 DIAGNOSIS — I739 Peripheral vascular disease, unspecified: Secondary | ICD-10-CM | POA: Insufficient documentation

## 2021-10-11 DIAGNOSIS — E669 Obesity, unspecified: Secondary | ICD-10-CM | POA: Insufficient documentation

## 2021-10-11 DIAGNOSIS — I052 Rheumatic mitral stenosis with insufficiency: Secondary | ICD-10-CM | POA: Diagnosis not present

## 2021-10-11 DIAGNOSIS — I5022 Chronic systolic (congestive) heart failure: Secondary | ICD-10-CM | POA: Insufficient documentation

## 2021-10-11 DIAGNOSIS — I130 Hypertensive heart and chronic kidney disease with heart failure and stage 1 through stage 4 chronic kidney disease, or unspecified chronic kidney disease: Secondary | ICD-10-CM | POA: Insufficient documentation

## 2021-10-11 DIAGNOSIS — Z79899 Other long term (current) drug therapy: Secondary | ICD-10-CM | POA: Diagnosis not present

## 2021-10-11 DIAGNOSIS — I5042 Chronic combined systolic (congestive) and diastolic (congestive) heart failure: Secondary | ICD-10-CM | POA: Diagnosis not present

## 2021-10-11 DIAGNOSIS — I959 Hypotension, unspecified: Secondary | ICD-10-CM | POA: Insufficient documentation

## 2021-10-11 DIAGNOSIS — I062 Rheumatic aortic stenosis with insufficiency: Secondary | ICD-10-CM

## 2021-10-11 DIAGNOSIS — I428 Other cardiomyopathies: Secondary | ICD-10-CM | POA: Diagnosis not present

## 2021-10-11 DIAGNOSIS — I38 Endocarditis, valve unspecified: Secondary | ICD-10-CM | POA: Diagnosis present

## 2021-10-11 LAB — BASIC METABOLIC PANEL
Anion gap: 8 (ref 5–15)
BUN: 25 mg/dL — ABNORMAL HIGH (ref 6–20)
CO2: 25 mmol/L (ref 22–32)
Calcium: 9.4 mg/dL (ref 8.9–10.3)
Chloride: 105 mmol/L (ref 98–111)
Creatinine, Ser: 1.49 mg/dL — ABNORMAL HIGH (ref 0.44–1.00)
GFR, Estimated: 40 mL/min — ABNORMAL LOW (ref >60)
Glucose, Bld: 102 mg/dL — ABNORMAL HIGH (ref 70–99)
Potassium: 4.1 mmol/L (ref 3.5–5.1)
Sodium: 138 mmol/L (ref 135–145)

## 2021-10-11 LAB — BRAIN NATRIURETIC PEPTIDE: B Natriuretic Peptide: 315 pg/mL — ABNORMAL HIGH (ref 0.0–100.0)

## 2021-10-11 MED ORDER — LEVOTHYROXINE SODIUM 125 MCG PO TABS: 125.0000 ug | ORAL_TABLET | Freq: Every day | ORAL | 0 refills | Status: DC

## 2021-10-11 MED ORDER — CARVEDILOL 12.5 MG PO TABS: ORAL_TABLET | ORAL | 11 refills | Status: DC

## 2021-10-11 NOTE — Patient Instructions (Signed)
Medication Changes:  Increase Carvedilol to 12.5 mg Twice daily   Lab Work:  Labs done today, your results will be available in MyChart, we will contact you for abnormal readings.   Testing/Procedures:  Your physician has requested that you have an echocardiogram. Echocardiography is a painless test that uses sound waves to create images of your heart. It provides your doctor with information about the size and shape of your heart and how well your heart's chambers and valves are working. This procedure takes approximately one hour. There are no restrictions for this procedure.  Peripheral Arterial Dopplers. You will be called to arrange the appointment.  Your physician has requested that you have cardiac CT. Cardiac computed tomography (CT) is a painless test that uses an x-ray machine to take clear, detailed pictures of your heart. For further information please visit [email or web address]. Please follow instruction sheet as given.  You will be called to arrange this appointment    Referrals:  You have be referred to the Heart Care Pharmacist for [facility]. They will call you to arrange the appointment.   Special Instructions // Education:  none  Follow-Up in: 6 weeks   At the [facility], you and your health needs are our priority. We have a designated team specialized in the treatment of Heart Failure. This Care Team includes your primary Heart Failure Specialized Cardiologist (physician), Advanced Practice Providers (APPs- Physician Assistants and Nurse Practitioners), and Pharmacist who all work together to provide you with the care you need, when you need it.   You may see any of the following providers on your designated Care Team at your next follow up:  Dr Glori Bickers Dr Haynes Kerns, NP Lyda Jester, [place] [facility] [place], [place] Audry Riles, PharmD   Please be sure to bring in all your medications bottles to every  appointment.   Need to Contact us:  If you have any questions or concerns before your next appointment please send [place] a message through [place] or call our office at [phone].    TO LEAVE A MESSAGE FOR THE NURSE SELECT OPTION 2, PLEASE LEAVE A MESSAGE INCLUDING: YOUR NAME DATE OF BIRTH CALL BACK NUMBER REASON FOR CALL**this is important as we prioritize the call backs  YOU WILL RECEIVE A CALL BACK THE SAME DAY AS LONG AS YOU CALL BEFORE 4:00 PM

## 2021-10-11 NOTE — Progress Notes (Signed)
Advanced Heart Failure Clinic Note   PCP: Aura Dials, PA-C Cardiology: Dr. Ellyn Hack HF Cardiology: Dr. Aundra Dubin  61 y.o. 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  She had aortic and mitral repairs in 2010.  Afterwards, EF was mildly low in the 40-45% range.  However, EF had dropped to 25-30% on 1/18 echo.  TEE showed severe MR, moderate AI, moderate aortic stenosis.  RHC/LHC in 2/18 showed significantly elevated LV and RV filling pressures.    At her initial appointment here, she was very dyspneic with exertion, with orthopnea and PND. Her meds were adjusted and Lasix increased.  She has seen Dr. Roxy Manns since that time for evaluation for redo valve surgery.  She would require high risk mitral and aortic valve replacements. For now, the plan will be close monitoring with surgery if starts to decline or has CHF exacerbation. CPX was done in 3/18 showing moderate to severely decreased functional capacity.  RHC in 4/18 showed optimized filling pressures but low cardiac output.  Repeat echo in 8/18 showed EF 30-35%, severe AI with moderate AS but only mild MR noted.  The RV was mildly dilated with mildly decreased systolic function.   TEE 06/28/2017: EF 45%, severe AI, mild to moderate AS, mild mitral valve stenosis s/p MV repair  In 7/19, she had TAVR with #23 Edwards Sapien 3 THV.  Echo post-op in 7/19 showed EF 30-35%, moderate LVH, bioprosthetic aortic valve looked ok, s/p MV repair with mild mitral stenosis.   She developed pain in her right leg and was found to have occluded right EIA on dopplers in 9/19, confirmed by CTA. Suspect this was a vascular access complication of TAVR.  She was seen by Dr. Donnetta Hutching, conservative treatment for now.   Echo in 2/20 showed that EF remains 30-35%, stable TAVR valve, s/p MVR repair with no regurgitation or significant stenosis.   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 There was concern for possible partial valve thrombosis and gated CT ordered to assess aortic valve.  This was never done, looks like probably was an insurance issue.   Today she returns for HF follow up. Has new insurance which should allow her CTA.  Weight up 2 lbs.  No significant exertional dyspnea.  No chest pain.  No lightheadedness. No claudication. Not very active.   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   Labs (2/18): K 3.8 => 3.4, creatinine 1.09 => 1.07, hgb 12.7, BNP 1010 Labs (3/18): K 4.6, creatinine 2.43 => 1.74 Labs (4/18): K 4, creatinine 1.28, digoxin 0.3 Labs 09/2016: K 4.4, creatinine 1.26.  Labs 01/19/2017: K 4.4, Creatinine 1.21, digoxin 0.3 Labs (10/18): K 4.7, creatinine 1.10, digoxin 0.3 Labs (12/18): K 4.9, creatinine 1.34, digoxin < 0.2 Labs (8/19): K 3.9, creatinine 1.33 Labs (9/19): K 3.7, creatinine 1.46 Labs (7/21): K 4.9, creatinine 1.38 Labs (8/22): K 3.8, creatinine 1.65, LDL 174 Labs (11/22): K 4.3, creatinine 1.21, LDL 30, LFTs ok Labs (2/23): K 4.3, creatinine 1.41  PMH: 1. HTN 2. Hypothyroidism 3. Hyperlipidemia 4. Rheumatic heart disease: s/p MV repair and aortic valve  repair in 2010, Dr. Roxy Manns. - TEE (2/18): EF 35-30%, dilated LV, rheumatic-appearing aortic valve s/p repair with moderate AS (AVA 1.1 cm^2), moderate AI.  MV s/p repair with mild-moderate mitral stenosis and eccentric severe MR.   - Echo (8/18): Severe AI but only moderate AS, MR was only mild on this study (may have missed full jet) with mean MV gradient 4 mmHg.  - TEE 06/28/2017: EF 45%, severe AI, mild to moderate AS, mild mitral valve stenosis  s/p MV repair - In 7/19, she had TAVR with #23 Edwards Sapien 3 THV.   - Echo post-op in 7/19 showed EF 30-35%, moderate LVH, bioprosthetic aortic valve looked ok, s/p MV repair with mild mitral stenosis.  - Echo in 11/22 showed TAVR valve with significant stenosis and regurgitation => mild-moderate PVL with mean gradient 29 mmHg and DI 0.22.  There was mild stenosis of the repaired mitral valve.  5. Chronic systolic CHF: Nonischemic cardiomyopathy, probably related to valvular disease.   - Echo (11/15): EF 40-45%, - Echo (12/16): EF 35-40%. - Echo (1/18): EF 25-30%.  - LHC/RHC (2/18): Normal coronaries, mean RA 18, mean PA 74/39 mean 52, mean PCWP 31, CI 1.98 Fick. - CPX (3/18): peak VO2 11.7, VE/VCO2 slope 40, RER 1.11 => moderate to severely decreased functional capacity due to HF.  - RHC (4/18): mean RA 4, PA 44/17 mean 28, mean PCWP 15, CI 1.73, PVR 4 WU.  - Echo (8/18): EF 30-35%, severe AI with moderate AS but only mild MR noted with mean MV gradient 4 mmHg.  The RV was mildly dilated with mildly decreased systolic function. - TEE 2/40: EF 45%, severe aortic insufficiency, mild to moderate AS, mild mitral valve stenosis s/p MV repair - Coronary CTA (5/19): No obstructive coronary disease.  - Echo (7/19): EF 30-35%, moderate LVH, bioprosthetic aortic valve looked ok, s/p MV repair with mild mitral stenosis.  - Echo (2/20): EF 30-35%, moderate LVH with diffuse hypokinesis, mildly decreased RV systolic function, bioprosthetic aortic valve s/p TAVR without significant stenosis or regurgitation, s/p MV repair with mean gradient 3 mmHg and no MR.  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  6. Carotid dopplers (6/19): No significant disease.  7. PAD: 9/19 peripheral arterial dopplers showed  total occluded proximal right external iliac artery.  - CTA confirmed total occlusion of R EIA with collaterals.  Suspect TAVR vascular access complication.   Social History   Socioeconomic History   Marital status: Single    Spouse name: Not on file   Number of children: Not on file   Years of education: Not on file   Highest education level: Not on file  Occupational History   Not on file  Tobacco Use   Smoking status: Former    Packs/day: 0.10    Years: 15.00    Pack years: 1.50    Types: Cigarettes    Quit date: 2011    Years since quitting: 12.3   Smokeless tobacco: Never  Vaping Use   Vaping Use: Never used  Substance and Sexual Activity   Alcohol use: Never   Drug use: Never   Sexual activity: Not Currently  Other Topics Concern   Not on file  Social History Narrative   Unemployed, on disability.   Now is finally reestablished with a PCP.  She is a former smoker, quit in 2011. Denies alcohol consumption   Tries to walk routinely.   Social Determinants of Health   Financial Resource Strain: Not on file  Food Insecurity: Not on file  Transportation Needs: Not on file  Physical Activity: Not on file  Stress: Not on file  Social Connections: Not on file  Intimate Partner Violence: Not on file   Family History  Problem Relation Age of Onset   Heart disease Mother    CVA Mother    Review of systems complete and found to be negative unless listed in HPI.    Current Outpatient Medications  Medication Sig Dispense Refill   acetaminophen (TYLENOL) 325 MG tablet Take 650 mg every 6 (six) hours as needed by mouth for mild pain.     albuterol (PROVENTIL HFA;VENTOLIN HFA) 108 (90 BASE) MCG/ACT inhaler Inhale 2 puffs into the lungs every 6 (six) hours as needed for shortness of breath.      aspirin 81 MG chewable tablet Chew 1 tablet (81 mg total) by mouth daily. 30 tablet 3   beclomethasone (QVAR) 40 MCG/ACT inhaler Inhale 2 puffs into the lungs daily as needed  (shortness of breath).      carvedilol (COREG) 6.25 MG tablet TAKE 1 TABLET(6.25 MG) BY MOUTH TWICE DAILY 60 tablet 11   dapagliflozin propanediol (FARXIGA) 10 MG TABS tablet Take 1 tablet (10 mg total) by mouth daily before breakfast. 30 tablet 11   furosemide (LASIX) 40 MG tablet Take 1 tablet (40 mg total) by mouth daily. 30 tablet 5   levothyroxine (SYNTHROID) 125 MCG tablet Patient takes 1 tablet by mouth on Monday Tuesday Wednesday Thursday Friday Saturday and 1/2 tablets on Sundays.     potassium chloride (KLOR-CON) 10 MEQ tablet Take 2 tablets (20 mEq total) by mouth daily. 180 tablet 3   rosuvastatin (CRESTOR) 20 MG tablet Take 1 tablet (20 mg total) by mouth daily. 30 tablet 11   sacubitril-valsartan (ENTRESTO) 49-51 MG Take 1 tablet by mouth 2 (two) times daily. 60 tablet 6   spironolactone (ALDACTONE) 25 MG tablet TAKE 1 TABLET(25 MG) BY MOUTH DAILY 30 tablet 5   No current facility-administered medications for this encounter.   BP 130/82   Pulse 75   Wt 88.5 kg (195 lb)   SpO2 99%   BMI 42.20 kg/m   Wt Readings from Last 3 Encounters:  10/11/21 88.5 kg (195 lb)  07/07/21 87.9 kg (193 lb 12.8 oz)  04/09/21 88.6 kg (195 lb 6.4 oz)   General: NAD, obese Neck: No JVD, no thyromegaly or thyroid nodule.  Lungs: Clear to auscultation bilaterally with normal respiratory effort. CV: Nondisplaced PMI.  Heart regular S1/S2, no S3/S4, 3/6 SEM RUSB with clear S2.  No peripheral edema.  No carotid bruit.  Normal pedal pulses.  Abdomen: Soft, nontender, no hepatosplenomegaly, no distention.  Skin: Intact without lesions or rashes.  Neurologic: Alert and oriented x 3.  Psych: Normal affect. Extremities: No clubbing or cyanosis.  HEENT: Normal.   Assessment/Plan: 1. 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  She had aortic and mitral valve repairs in 2010.   It was felt that she would not be a very good Mitraclip candidate as she already has a degree of mitral stenosis.  TEE in 2/18  showed moderate AS/moderate AI , mild to moderate MS, severe eccentric MR.  Echo in 8/18 showed moderate AS/severe AI but only mild mitral stenosis and mild MR noted. Hemodynamics looked better on 4/18 RHC  though cardiac output still marginal.  Repeat TEE 06/28/2017: EF 45%, severe aortic insufficiency, mild to moderate AS, mild mitral valve stenosis s/p MV repair with trivial MR. She had TAVR in 7/19, feeling much better since then.  Echo 6/21 showed stable bioprosthetic aortic valve, s/p mitral valve repair without significant stenosis or regurgitation. Echo 11/22,? partial-valve thrombosis, elevated mean gradient 29 mm Hg, previously 22 mm HG 6/21. Needs gated CT to better visualize aortic valve. This has not been scheduled yet as insurance apparently would not allow it.  She now has new insurance that hopefully will allow CTA.  Luckily, she has minimal symptoms.  - Cardiac CTA to visualize the bioprosthetic aortic valve => re-ordered today.  - Repeat echo to reassess TAVR valve.  - She will need antibiotics with dental work.  - Continue ASA 81 daily.  2. Chronic systolic CHF:  Nonischemic cardiomyopathy (no significant disease on 2/18 LHC).  It is possible that this cardiomyopathy is driven by her valvular disease. Echo (8/18) with EF 30-35% RV mildly dilated. TEE 06/28/2017: EF 45%, severe aortic insufficiency, mild to moderate AS, mild mitral valve stenosis s/p MV repair with only trivial MR. Post-TAVR echo showed EF down to 30-35%, and echo in 2/20 showed EF stable in 30-35% range.  This may reflect myocardial dysfunction from prior long-standing aortic insufficiency.  Echo 6/21 showed EF 40-45%. Echo (11/22): EF 35-40%. She does not look volume overloaded on exam.  NYHA class I-II symptoms.     - Continue Entresto 49/51 mg bid. BMET today. - Continue Lasix 40 mg daily. - Continue Farxiga 10 mg daily. - Continue spironolactone 25 mg daily.  - Increase Coreg to 12.5 mg bid.  - Bidil stopped with  hypotension. - EF just out of ICD range.  3. CKD stage III: BMET today.  4. PAD: Occluded right EIA likely due to TAVR vascular access complication.  Really having minimal right leg claudication (had collaterals on CTA).    - Due for peripheral arterial dopplers, will arrange.  5. Hyperlipidemia: LDL 30 11/22. On Crestor. 6. Obesity: I will refer to pharmacy clinic for semaglutide.   Followup 6 wks   Loralie Champagne 10/12/2021

## 2021-10-12 ENCOUNTER — Telehealth: Payer: Self-pay

## 2021-10-12 NOTE — Telephone Encounter (Signed)
-----   Message from [place], [place] sent at 10/12/2021  6:55 AM EDT ----- Regarding: appt needed Call an offer an appt for pharmd to talk semaglutide plz ----- Message ----- From: Rockne Menghini, RPH-CPP Sent: 10/12/2021  12:00 AM EDT To: Allean Found, CMA   ----- Message ----- From: Jerl Mina, RN Sent: 10/11/2021   2:21 PM EDT To: [facility] Pharm[facility]  Semaglutide please

## 2021-10-12 NOTE — Telephone Encounter (Signed)
Lmom to schedule glp1 pharmd appt will route to myself to continue following up on.

## 2021-10-13 NOTE — Telephone Encounter (Signed)
CALLED AND SCHEDULED APPT

## 2021-10-14 ENCOUNTER — Other Ambulatory Visit: Payer: Self-pay | Admitting: Cardiology

## 2021-10-14 DIAGNOSIS — I739 Peripheral vascular disease, unspecified: Secondary | ICD-10-CM

## 2021-10-19 ENCOUNTER — Ambulatory Visit: Payer: Medicaid Other

## 2021-10-19 DIAGNOSIS — E119 Type 2 diabetes mellitus without complications: Secondary | ICD-10-CM | POA: Insufficient documentation

## 2021-10-19 NOTE — Progress Notes (Deleted)
Patient ID: [name]                 DOB: 1961-01-17                    MRN: [id number]     HPI: [name] is a 61 y.o. female patient referred to pharmacy clinic by Dr Aundra Dubin to initiate weight loss therapy with GLP1-RA. PMH is significant for obesity, rheumatic heart disease, CHF, HTN, T2DM, TIA, s/p TAVR, MVR, AVR, and HLD. Most recent BMI 42.2.  Last A1c  6.8 on 11/17/17.  *** If diabetic and on insulin/sulfonylurea, can consider reducing dose to reduce risk of hypoglycemia  *** Follow-up visit  Assess % weight loss Assess adverse effects Missed doses  Current weight management medications:   Previously tried meds:  Current meds that may affect weight:  Baseline weight/BMI:  Insurance payor:   Diet:  -Breakfast: -Lunch: -Dinner: -Snacks: -Drinks:  Exercise:   Family History:   Social History:   Labs: Lab Results  Component Value Date   HGBA1C 6.8 (H) 11/17/2017    Wt Readings from Last 1 Encounters:  10/11/21 195 lb (88.5 kg)    BP Readings from Last 1 Encounters:  10/11/21 130/82   Pulse Readings from Last 1 Encounters:  10/11/21 75       Component Value Date/Time   CHOL 94 04/09/2021 1555   TRIG 157 (H) 04/09/2021 1555   HDL 33 (L) 04/09/2021 1555   CHOLHDL 2.8 04/09/2021 1555   VLDL 31 04/09/2021 1555   [place] 30 04/09/2021 1555    Past Medical History:  Diagnosis Date   Chronic combined systolic and diastolic CHF (congestive heart failure) ([place])    EF now down to 25-30%. LVEDP on cath was 30 mmHg with wedge pressure of 31 mmHg.   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  mean PA 74/39 mean 52, mean PCWP 31 (with V wave of 45 mmHg), LVEDP ~ 30 mmHg.   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 Progressive worsening of aortic and mitral valve disease.    Current Outpatient Medications on  File Prior to Visit  Medication Sig Dispense Refill   acetaminophen (TYLENOL) 325 MG tablet Take 650 mg every 6 (six) hours as needed by mouth for mild pain.     albuterol (PROVENTIL HFA;VENTOLIN HFA) 108 (90 BASE) MCG/ACT inhaler Inhale 2 puffs into the lungs every 6 (six) hours as needed for shortness of breath.      aspirin 81 MG chewable tablet Chew 1 tablet (81 mg total) by mouth daily. 30 tablet 3   beclomethasone (QVAR) 40 MCG/ACT inhaler Inhale 2 puffs into the lungs daily as needed (shortness of breath).      carvedilol (COREG) 12.5 MG tablet TAKE 1 TABLET(6.25 MG) BY MOUTH TWICE DAILY 60 tablet 11   dapagliflozin propanediol (FARXIGA) 10 MG TABS tablet Take 1 tablet (10 mg total) by mouth daily before breakfast. 30 tablet 11   furosemide (LASIX) 40 MG  tablet Take 1 tablet (40 mg total) by mouth daily. 30 tablet 5   levothyroxine (SYNTHROID) 125 MCG tablet Take 1 tablet (125 mcg total) by mouth daily before breakfast. Patient takes 1 tablet by mouth on Monday Tuesday Wednesday Thursday Friday Saturday and 1/2 tablets on Sundays. 30 tablet 0   potassium chloride (KLOR-CON) 10 MEQ tablet Take 2 tablets (20 mEq total) by mouth daily. 180 tablet 3   rosuvastatin (CRESTOR) 20 MG tablet Take 1 tablet (20 mg total) by mouth daily. 30 tablet 11   sacubitril-valsartan (ENTRESTO) 49-51 MG Take 1 tablet by mouth 2 (two) times daily. 60 tablet 6   spironolactone (ALDACTONE) 25 MG tablet TAKE 1 TABLET(25 MG) BY MOUTH DAILY 30 tablet 5   No current facility-administered medications on file prior to visit.    Allergies  Allergen Reactions   Morphine And Related Other (See Comments)    Hallucinations      Assessment/Plan:  1. Weight loss - Patient has not met goal of at least 5% of body weight loss with comprehensive lifestyle modifications alone in the past 3-6 months. Pharmacotherapy is appropriate to pursue as augmentation. Will start ***. Confirmed patient not ***pregnant and no personal or family history of medullary thyroid carcinoma (MTC) or Multiple Endocrine Neoplasia syndrome type 2 (MEN 2).   Advised patient on common side effects including nausea, diarrhea, dyspepsia, decreased appetite, and fatigue. Counseled patient on reducing meal size and how to titrate medication to minimize side effects. Counseled patient to call if intolerable side effects or if experiencing dehydration, abdominal pain, or dizziness. Patient will adhere to dietary modifications and will target at least 150 minutes of moderate intensity exercise weekly.   Injection technique reviewed at today's visit and patient successfully self-administered first dose of *** into the fatty tissue of the abdomen.  Titration Plan:  Will plan to follow the titration plan as below,  pending patient is tolerating each dose before increasing to the next. Can slow titration if needed for tolerability.    -Month 1: Inject *** SQ once weekly x 4 weeks -Month 2: Inject *** SQ once weekly x 4 weeks -Month 3: Inject *** SQ once weekly x 4 weeks -Month 4+: Inject *** SQ once weekly   Follow up in ***.

## 2021-10-22 ENCOUNTER — Ambulatory Visit: Payer: Medicaid Other

## 2021-10-25 ENCOUNTER — Telehealth (HOSPITAL_COMMUNITY): Payer: Self-pay | Admitting: *Deleted

## 2021-10-25 ENCOUNTER — Other Ambulatory Visit (HOSPITAL_COMMUNITY): Payer: Self-pay | Admitting: Cardiology

## 2021-10-25 ENCOUNTER — Ambulatory Visit (HOSPITAL_COMMUNITY)
Admission: RE | Admit: 2021-10-25 | Discharge: 2021-10-25 | Disposition: A | Discharge status: Home / Self Care | Payer: Medicaid Other | Source: Ambulatory Visit | Attending: Cardiology | Admitting: Cardiology

## 2021-10-25 DIAGNOSIS — I350 Nonrheumatic aortic (valve) stenosis: Secondary | ICD-10-CM

## 2021-10-25 DIAGNOSIS — I062 Rheumatic aortic stenosis with insufficiency: Secondary | ICD-10-CM

## 2021-10-25 DIAGNOSIS — I351 Nonrheumatic aortic (valve) insufficiency: Secondary | ICD-10-CM | POA: Diagnosis not present

## 2021-10-25 DIAGNOSIS — I110 Hypertensive heart disease with heart failure: Secondary | ICD-10-CM | POA: Insufficient documentation

## 2021-10-25 DIAGNOSIS — I509 Heart failure, unspecified: Secondary | ICD-10-CM | POA: Insufficient documentation

## 2021-10-25 DIAGNOSIS — I340 Nonrheumatic mitral (valve) insufficiency: Secondary | ICD-10-CM | POA: Diagnosis not present

## 2021-10-25 DIAGNOSIS — I5022 Chronic systolic (congestive) heart failure: Secondary | ICD-10-CM

## 2021-10-25 DIAGNOSIS — E785 Hyperlipidemia, unspecified: Secondary | ICD-10-CM | POA: Insufficient documentation

## 2021-10-25 DIAGNOSIS — I272 Pulmonary hypertension, unspecified: Secondary | ICD-10-CM | POA: Insufficient documentation

## 2021-10-25 DIAGNOSIS — I429 Cardiomyopathy, unspecified: Secondary | ICD-10-CM | POA: Insufficient documentation

## 2021-10-25 LAB — ECHOCARDIOGRAM COMPLETE
AR max vel: 0.78 cm2
AV Area VTI: 0.67 cm2
AV Area mean vel: 0.7 cm2
AV Mean grad: 48.7 mmHg
AV Peak grad: 73.6 mmHg
Ao pk vel: 4.29 m/s
Area-P 1/2: 1.4 cm2
Calc EF: 43.7 %
MV VTI: 1.7 cm2
P 1/2 time: 417 msec
S' Lateral: 4.6 cm
Single Plane A2C EF: 43.9 %
Single Plane A4C EF: 43.2 %

## 2021-10-25 MED ORDER — PERFLUTREN LIPID MICROSPHERE
1.0000 mL | INTRAVENOUS | Status: AC | PRN
  Administered 2021-10-25: 3 mL via INTRAVENOUS

## 2021-10-25 NOTE — Telephone Encounter (Signed)
Auth request for cardiac CT faxed to [employer] at [phone]

## 2021-10-25 NOTE — Progress Notes (Signed)
  Echocardiogram 2D Echocardiogram has been performed.  [name] 10/25/2021, 4:24 PM

## 2021-11-02 ENCOUNTER — Inpatient Hospital Stay (HOSPITAL_COMMUNITY): Admission: RE | Admit: 2021-11-02 | Payer: Medicaid Other | Source: Ambulatory Visit

## 2021-11-11 ENCOUNTER — Telehealth (HOSPITAL_COMMUNITY): Payer: Self-pay | Admitting: Emergency Medicine

## 2021-11-11 ENCOUNTER — Encounter (HOSPITAL_COMMUNITY): Payer: Self-pay

## 2021-11-11 DIAGNOSIS — Z952 Presence of prosthetic heart valve: Secondary | ICD-10-CM

## 2021-11-11 MED ORDER — METOPROLOL TARTRATE 50 MG PO TABS: 50.0000 mg | ORAL_TABLET | Freq: Once | ORAL | 0 refills | Status: DC

## 2021-11-11 NOTE — Telephone Encounter (Signed)
Reaching out to patient to offer assistance regarding upcoming cardiac imaging study; pt verbalizes understanding of appt date/time, parking situation and where to check in, pre-test NPO status and medications ordered, and verified current allergies; name and call back number provided for further questions should they arise Marchia Bond RN Navigator Cardiac Imaging [facility] [phone] office [phone] cell  '50mg'$  metoprolol tartrate Small veins Arrival 330

## 2021-11-12 ENCOUNTER — Ambulatory Visit (INDEPENDENT_AMBULATORY_CARE_PROVIDER_SITE_OTHER): Payer: Medicaid Other | Admitting: Pharmacist

## 2021-11-12 VITALS — Ht <= 58 in | Wt 195.0 lb

## 2021-11-12 DIAGNOSIS — E119 Type 2 diabetes mellitus without complications: Secondary | ICD-10-CM

## 2021-11-13 LAB — HEMOGLOBIN A1C
Est. average glucose Bld gHb Est-mCnc: 140 mg/dL
Hgb A1c MFr Bld: 6.5 % — ABNORMAL HIGH (ref 4.8–5.6)

## 2021-11-15 ENCOUNTER — Ambulatory Visit (HOSPITAL_COMMUNITY)
Admission: RE | Admit: 2021-11-15 | Discharge: 2021-11-15 | Disposition: A | Discharge status: Home / Self Care | Payer: Medicaid Other | Source: Ambulatory Visit | Attending: Cardiology | Admitting: Cardiology

## 2021-11-15 ENCOUNTER — Telehealth: Payer: Self-pay | Admitting: Pharmacist

## 2021-11-15 DIAGNOSIS — I350 Nonrheumatic aortic (valve) stenosis: Secondary | ICD-10-CM

## 2021-11-15 MED ORDER — IOHEXOL 350 MG/ML SOLN
100.0000 mL | Freq: Once | INTRAVENOUS | Status: AC | PRN
  Administered 2021-11-15: 100 mL via INTRAVENOUS

## 2021-11-16 ENCOUNTER — Ambulatory Visit (HOSPITAL_COMMUNITY)
Admission: RE | Admit: 2021-11-16 | Discharge: 2021-11-16 | Disposition: A | Discharge status: Home / Self Care | Payer: Medicaid Other | Source: Ambulatory Visit | Attending: Cardiology | Admitting: Cardiology

## 2021-11-16 ENCOUNTER — Other Ambulatory Visit (HOSPITAL_COMMUNITY): Payer: Self-pay

## 2021-11-16 ENCOUNTER — Other Ambulatory Visit (HOSPITAL_COMMUNITY): Payer: Self-pay | Admitting: Cardiology

## 2021-11-16 DIAGNOSIS — I739 Peripheral vascular disease, unspecified: Secondary | ICD-10-CM

## 2021-11-16 MED ORDER — APIXABAN 5 MG PO TABS: 5.0000 mg | ORAL_TABLET | Freq: Two times a day (BID) | ORAL | 11 refills | Status: DC

## 2021-11-17 ENCOUNTER — Telehealth (HOSPITAL_COMMUNITY): Payer: Self-pay | Admitting: Cardiology

## 2021-11-17 DIAGNOSIS — I739 Peripheral vascular disease, unspecified: Secondary | ICD-10-CM

## 2021-11-17 NOTE — Telephone Encounter (Signed)
2nd message left for pt.

## 2021-11-17 NOTE — Telephone Encounter (Signed)
Referral placed.

## 2021-11-17 NOTE — Telephone Encounter (Signed)
-----   Message from Larey Dresser, MD sent at 11/16/2021  3:31 PM EDT ----- Occluded mid and distal CFA on right, ABI normal on left.  Needs followup with [facility], saw Dr. Donnetta Hutching in the past.

## 2021-11-25 NOTE — Telephone Encounter (Signed)
3rd message left for pt. Will await her return call at this time.

## 2021-11-26 ENCOUNTER — Encounter (HOSPITAL_COMMUNITY): Payer: Self-pay | Admitting: Cardiology

## 2021-11-26 ENCOUNTER — Ambulatory Visit (HOSPITAL_COMMUNITY)
Admission: RE | Admit: 2021-11-26 | Discharge: 2021-11-26 | Disposition: A | Discharge status: Home / Self Care | Payer: Medicaid Other | Source: Ambulatory Visit | Attending: Cardiology | Admitting: Cardiology

## 2021-11-26 VITALS — BP 108/68 | HR 62 | Wt 192.0 lb

## 2021-11-26 DIAGNOSIS — Z87891 Personal history of nicotine dependence: Secondary | ICD-10-CM | POA: Diagnosis not present

## 2021-11-26 DIAGNOSIS — I099 Rheumatic heart disease, unspecified: Secondary | ICD-10-CM | POA: Insufficient documentation

## 2021-11-26 DIAGNOSIS — Z952 Presence of prosthetic heart valve: Secondary | ICD-10-CM | POA: Diagnosis not present

## 2021-11-26 DIAGNOSIS — N183 Chronic kidney disease, stage 3 unspecified: Secondary | ICD-10-CM | POA: Diagnosis not present

## 2021-11-26 DIAGNOSIS — Z8249 Family history of ischemic heart disease and other diseases of the circulatory system: Secondary | ICD-10-CM | POA: Diagnosis not present

## 2021-11-26 DIAGNOSIS — I420 Dilated cardiomyopathy: Secondary | ICD-10-CM | POA: Diagnosis not present

## 2021-11-26 DIAGNOSIS — Z823 Family history of stroke: Secondary | ICD-10-CM | POA: Insufficient documentation

## 2021-11-26 DIAGNOSIS — I428 Other cardiomyopathies: Secondary | ICD-10-CM | POA: Insufficient documentation

## 2021-11-26 DIAGNOSIS — I052 Rheumatic mitral stenosis with insufficiency: Secondary | ICD-10-CM | POA: Diagnosis not present

## 2021-11-26 DIAGNOSIS — I739 Peripheral vascular disease, unspecified: Secondary | ICD-10-CM

## 2021-11-26 DIAGNOSIS — I130 Hypertensive heart and chronic kidney disease with heart failure and stage 1 through stage 4 chronic kidney disease, or unspecified chronic kidney disease: Secondary | ICD-10-CM | POA: Diagnosis present

## 2021-11-26 DIAGNOSIS — I5022 Chronic systolic (congestive) heart failure: Secondary | ICD-10-CM | POA: Diagnosis not present

## 2021-11-26 DIAGNOSIS — Z79899 Other long term (current) drug therapy: Secondary | ICD-10-CM | POA: Insufficient documentation

## 2021-11-26 DIAGNOSIS — I5042 Chronic combined systolic (congestive) and diastolic (congestive) heart failure: Secondary | ICD-10-CM

## 2021-11-26 DIAGNOSIS — Z7901 Long term (current) use of anticoagulants: Secondary | ICD-10-CM | POA: Diagnosis not present

## 2021-11-26 DIAGNOSIS — E669 Obesity, unspecified: Secondary | ICD-10-CM | POA: Diagnosis not present

## 2021-11-26 DIAGNOSIS — Z7982 Long term (current) use of aspirin: Secondary | ICD-10-CM | POA: Diagnosis not present

## 2021-11-26 DIAGNOSIS — I062 Rheumatic aortic stenosis with insufficiency: Secondary | ICD-10-CM

## 2021-11-26 DIAGNOSIS — E039 Hypothyroidism, unspecified: Secondary | ICD-10-CM | POA: Insufficient documentation

## 2021-11-26 DIAGNOSIS — Z953 Presence of xenogenic heart valve: Secondary | ICD-10-CM | POA: Insufficient documentation

## 2021-11-26 DIAGNOSIS — E785 Hyperlipidemia, unspecified: Secondary | ICD-10-CM

## 2021-11-26 LAB — LIPID PANEL
Cholesterol: 89 mg/dL (ref 0–200)
HDL: 33 mg/dL — ABNORMAL LOW (ref >40)
LDL Cholesterol: 43 mg/dL (ref 0–99)
Total CHOL/HDL Ratio: 2.7 RATIO
Triglycerides: 65 mg/dL (ref <150)
VLDL: 13 mg/dL (ref 0–40)

## 2021-11-26 LAB — CBC
HCT: 41.4 % (ref 36.0–46.0)
Hemoglobin: 13.4 g/dL (ref 12.0–15.0)
MCH: 28.3 pg (ref 26.0–34.0)
MCHC: 32.4 g/dL (ref 30.0–36.0)
MCV: 87.5 fL (ref 80.0–100.0)
Platelets: 129 10*3/uL — ABNORMAL LOW (ref 150–400)
RBC: 4.73 MIL/uL (ref 3.87–5.11)
RDW: 13.8 % (ref 11.5–15.5)
WBC: 8.4 10*3/uL (ref 4.0–10.5)
nRBC: 0 % (ref 0.0–0.2)

## 2021-11-26 LAB — BASIC METABOLIC PANEL
Anion gap: 11 (ref 5–15)
BUN: 28 mg/dL — ABNORMAL HIGH (ref 6–20)
CO2: 24 mmol/L (ref 22–32)
Calcium: 9.2 mg/dL (ref 8.9–10.3)
Chloride: 101 mmol/L (ref 98–111)
Creatinine, Ser: 1.53 mg/dL — ABNORMAL HIGH (ref 0.44–1.00)
GFR, Estimated: 39 mL/min — ABNORMAL LOW (ref >60)
Glucose, Bld: 115 mg/dL — ABNORMAL HIGH (ref 70–99)
Potassium: 4 mmol/L (ref 3.5–5.1)
Sodium: 136 mmol/L (ref 135–145)

## 2021-11-26 NOTE — Patient Instructions (Signed)
Medication Changes:  No changes, continue current medications  Lab Work:  Labs done today, your results will be available in MyChart, we will contact you for abnormal readings.  Testing/Procedures:  Your physician has requested that you have an echocardiogram. Echocardiography is a painless test that uses sound waves to create images of your heart. It provides your doctor with information about the size and shape of your heart and how well your heart's chambers and valves are working. This procedure takes approximately one hour. There are no restrictions for this procedure. IN 3 MONTHS  Referrals:  None  Special Instructions // Education:  Do the following things EVERYDAY: Weigh yourself in the morning before breakfast. Write it down and keep it in a log. Take your medicines as prescribed Eat low salt foods--Limit salt (sodium) to 2000 mg per day.  Stay as active as you can everyday Limit all fluids for the day to less than 2 liters   Follow-Up in: 3 months with an echocardiogram  At the [facility], you and your health needs are our priority. We have a designated team specialized in the treatment of Heart Failure. This Care Team includes your primary Heart Failure Specialized Cardiologist (physician), Advanced Practice Providers (APPs- Physician Assistants and Nurse Practitioners), and Pharmacist who all work together to provide you with the care you need, when you need it.   You may see any of the following providers on your designated Care Team at your next follow up:  Dr Glori Bickers Dr Haynes Kerns, NP Lyda Jester, [place] [facility] [place], [place] Audry Riles, PharmD   Please be sure to bring in all your medications bottles to every appointment.   Need to Contact us:  If you have any questions or concerns before your next appointment please send [place] a message through [place] or call our office at [phone].    TO  LEAVE A MESSAGE FOR THE NURSE SELECT OPTION 2, PLEASE LEAVE A MESSAGE INCLUDING: YOUR NAME DATE OF BIRTH CALL BACK NUMBER REASON FOR CALL**this is important as we prioritize the call backs  YOU WILL RECEIVE A CALL BACK THE SAME DAY AS LONG AS YOU CALL BEFORE 4:00 PM

## 2021-11-28 NOTE — Progress Notes (Signed)
Advanced Heart Failure Clinic Note   PCP: Aura Dials, PA-C Cardiology: Dr. Ellyn Hack HF Cardiology: Dr. Aundra Dubin  61 y.o. 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  She had aortic and mitral repairs in 2010.  Afterwards, EF was mildly low in the 40-45% range.  However, EF had dropped to 25-30% on 1/18 echo.  TEE showed severe MR, moderate AI, moderate aortic stenosis.  RHC/LHC in 2/18 showed significantly elevated LV and RV filling pressures.    At her initial appointment here, she was very dyspneic with exertion, with orthopnea and PND. Her meds were adjusted and Lasix increased.  She has seen Dr. Roxy Manns since that time for evaluation for redo valve surgery.  She would require high risk mitral and aortic valve replacements. For now, the plan will be close monitoring with surgery if starts to decline or has CHF exacerbation. CPX was done in 3/18 showing moderate to severely decreased functional capacity.  RHC in 4/18 showed optimized filling pressures but low cardiac output.  Repeat echo in 8/18 showed EF 30-35%, severe AI with moderate AS but only mild MR noted.  The RV was mildly dilated with mildly decreased systolic function.   TEE 06/28/2017: EF 45%, severe AI, mild to moderate AS, mild mitral valve stenosis s/p MV repair  In 7/19, she had TAVR with #23 Edwards Sapien 3 THV.  Echo post-op in 7/19 showed EF 30-35%, moderate LVH, bioprosthetic aortic valve looked ok, s/p MV repair with mild mitral stenosis.   She developed pain in her right leg and was found to have occluded right EIA on dopplers in 9/19, confirmed by CTA. Suspect this was a vascular access complication of TAVR.  She was seen by Dr. Donnetta Hutching, conservative treatment for now.   Echo in 2/20 showed that EF remains 30-35%, stable TAVR valve, s/p MVR repair with no regurgitation or significant stenosis.   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 There was concern for possible partial valve thrombosis and gated CT ordered to assess aortic valve.  Echo in 6/23 showed EF 30-35% with wall motion abnormalities, moderate RV dysfunction, s/p mitral valve repair with trivial MR and mean gradient 4 mmHg, bioprosthetic aortic valve s/p TAVR with mean gradient 49 mmHg and AVA 0.67 cm^2. 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 Patient was started on Eliquis.   Patient returns for followup of CHF.  She is doing well symptomatically. Weight down 3 lbs.  She reports no dyspnea walking on flat ground.  No orthopnea/PND.  No chest pain.  She does get aching in her right thigh with ambulation at times.   ECG (personally reviewed): NSR, 1st degree AVB, LVH with repolarization abnormality, inferior and anterolateral Qs   Labs (2/18): K 3.8 => 3.4, creatinine 1.09 => 1.07, hgb 12.7, BNP 1010 Labs (3/18): K 4.6, creatinine 2.43 => 1.74 Labs (4/18): K 4, creatinine 1.28, digoxin 0.3 Labs 09/2016: K 4.4, creatinine 1.26.  Labs 01/19/2017: K 4.4, Creatinine 1.21, digoxin 0.3 Labs (10/18): K 4.7, creatinine 1.10, digoxin 0.3 Labs (12/18): K 4.9, creatinine 1.34, digoxin <  0.2 Labs (8/19): K 3.9, creatinine 1.33 Labs (9/19): K 3.7, creatinine 1.46 Labs (7/21): K 4.9, creatinine 1.38 Labs (8/22): K 3.8, creatinine 1.65, LDL 174 Labs (11/22): K 4.3, creatinine 1.21, LDL 30, LFTs ok Labs (2/23): K 4.3, creatinine 1.41 Labs (5/23): K 4.1, creatinine 1.49, BNP 315  PMH: 1. HTN 2. Hypothyroidism 3. Hyperlipidemia 4. Rheumatic heart disease: s/p MV repair and aortic valve repair in 2010, Dr. Roxy Manns. - TEE (2/18):  EF 35-30%, dilated LV, rheumatic-appearing aortic valve s/p repair with moderate AS (AVA 1.1 cm^2), moderate AI.  MV s/p repair with mild-moderate mitral stenosis and eccentric severe MR.   - Echo (8/18): Severe AI but only moderate AS, MR was only mild on this study (may have missed full jet) with mean MV gradient 4 mmHg.  - TEE 06/28/2017: EF 45%, severe AI, mild to moderate AS, mild mitral valve stenosis s/p MV repair - In 7/19, she had TAVR with #23 Edwards Sapien 3 THV.   - Echo post-op in 7/19 showed EF 30-35%, moderate LVH, bioprosthetic aortic valve looked ok, s/p MV repair with mild mitral stenosis.  - Echo in 11/22 showed TAVR valve with significant stenosis and regurgitation => mild-moderate PVL with mean gradient 29 mmHg and DI 0.22.  There was mild stenosis of the repaired mitral valve.  - Gated CTA chest (6/23) showed HALT/HAM involving all 3 leaflets of the bioprosthetic aortic valve with restriction to motion 5. Chronic systolic CHF: Nonischemic cardiomyopathy, probably related to valvular disease.   - Echo (11/15): EF 40-45%, - Echo (12/16): EF 35-40%. - Echo (1/18): EF 25-30%.  - LHC/RHC (2/18): Normal coronaries, mean RA 18, mean PA 74/39 mean 52, mean PCWP 31, CI 1.98 Fick. - CPX (3/18): peak VO2 11.7, VE/VCO2 slope 40, RER 1.11 => moderate to severely decreased functional capacity due to HF.  - RHC (4/18): mean RA 4, PA 44/17 mean 28, mean PCWP 15, CI 1.73, PVR 4 WU.  - Echo (8/18): EF 30-35%, severe AI with moderate AS but only mild MR noted with mean MV gradient 4 mmHg.  The RV was mildly dilated with mildly decreased systolic function. - TEE 5/32: EF 45%, severe aortic insufficiency, mild to moderate AS, mild mitral valve stenosis s/p MV repair - Coronary CTA (5/19): No obstructive coronary disease.  - Echo (7/19): EF 30-35%, moderate LVH, bioprosthetic aortic valve looked ok, s/p MV repair with mild mitral stenosis.  - Echo (2/20): EF 30-35%, moderate LVH with diffuse  hypokinesis, mildly decreased RV systolic function, bioprosthetic aortic valve s/p TAVR without significant stenosis or regurgitation, s/p MV repair with mean gradient 3 mmHg and no MR.  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  - Echo (6/23):  EF 30-35% with wall motion abnormalities, moderate RV dysfunction, s/p mitral valve repair with trivial MR and mean gradient 4 mmHg, bioprosthetic aortic valve s/p TAVR with mean gradient 49 mmHg and AVA 0.67 cm^2.  6. Carotid dopplers (6/19): No significant disease.  7. PAD: 9/19 peripheral arterial dopplers showed total occluded proximal right external iliac artery.  - CTA confirmed total occlusion of R EIA with collaterals.  Suspect TAVR vascular access complication.  - Peripheral arterial dopplers (6/23): totally occluded right mid to distal CFA.  Social History   Socioeconomic History   Marital status: Single  Spouse name: Not on file   Number of children: Not on file   Years of education: Not on file   Highest education level: Not on file  Occupational History   Not on file  Tobacco Use   Smoking status: Former    Packs/day: 0.10    Years: 15.00    Total pack years: 1.50    Types: Cigarettes    Quit date: 2011    Years since quitting: 12.5   Smokeless tobacco: Never  Vaping Use   Vaping Use: Never used  Substance and Sexual Activity   Alcohol use: Never   Drug use: Never   Sexual activity: Not Currently  Other Topics Concern   Not on file  Social History Narrative   Unemployed, on disability.   Now is finally reestablished with a PCP.   She is a former smoker, quit in 2011. Denies alcohol consumption   Tries to walk routinely.   Social Determinants of Health   Financial Resource Strain: Not on file  Food Insecurity:  Not on file  Transportation Needs: Not on file  Physical Activity: Not on file  Stress: Not on file  Social Connections: Not on file  Intimate Partner Violence: Not on file   Family History  Problem Relation Age of Onset   Heart disease Mother    CVA Mother    Review of systems complete and found to be negative unless listed in HPI.    Current Outpatient Medications  Medication Sig Dispense Refill   acetaminophen (TYLENOL) 325 MG tablet Take 650 mg every 6 (six) hours as needed by mouth for mild pain.     albuterol (PROVENTIL HFA;VENTOLIN HFA) 108 (90 BASE) MCG/ACT inhaler Inhale 2 puffs into the lungs every 6 (six) hours as needed for shortness of breath.      apixaban (ELIQUIS) 5 MG TABS tablet Take 1 tablet (5 mg total) by mouth 2 (two) times daily. 60 tablet 11   aspirin 81 MG chewable tablet Chew 1 tablet (81 mg total) by mouth daily. 30 tablet 3   beclomethasone (QVAR) 40 MCG/ACT inhaler Inhale 2 puffs into the lungs daily as needed (shortness of breath).      carvedilol (COREG) 12.5 MG tablet TAKE 1 TABLET(6.25 MG) BY MOUTH TWICE DAILY 60 tablet 11   dapagliflozin propanediol (FARXIGA) 10 MG TABS tablet Take 1 tablet (10 mg total) by mouth daily before breakfast. 30 tablet 11   furosemide (LASIX) 40 MG tablet Take 1 tablet (40 mg total) by mouth daily. 30 tablet 5   levothyroxine (SYNTHROID) 125 MCG tablet Take 1 tablet (125 mcg total) by mouth daily before breakfast. Patient takes 1 tablet by mouth on Monday Tuesday Wednesday Thursday Friday Saturday and 1/2 tablets on Sundays. 30 tablet 0   potassium chloride (KLOR-CON) 10 MEQ tablet Take 2 tablets (20 mEq total) by mouth daily. 180 tablet 3   rosuvastatin (CRESTOR) 20 MG tablet Take 1 tablet (20 mg total) by mouth daily. 30 tablet 11   sacubitril-valsartan (ENTRESTO) 49-51 MG Take 1 tablet by mouth 2 (two) times daily. 60 tablet 6   spironolactone (ALDACTONE) 25 MG tablet TAKE 1 TABLET(25 MG) BY MOUTH DAILY 30 tablet 5   No  current facility-administered medications for this encounter.   BP 108/68   Pulse 62   Wt 87.1 kg (192 lb)   SpO2 97%   BMI 41.55 kg/m   Wt Readings from Last 3 Encounters:  11/26/21 87.1 kg (192 lb)  11/12/21 88.5  kg (195 lb)  10/11/21 88.5 kg (195 lb)   General: NAD Neck: No JVD, no thyromegaly or thyroid nodule.  Lungs: Clear to auscultation bilaterally with normal respiratory effort. CV: Nondisplaced PMI.  Heart regular S1/S2, no S3/S4, 2/6 SEM RUSB with clear S2.  No peripheral edema.  No carotid bruit.  Cannot palpate right PT pulse.  Abdomen: Soft, nontender, no hepatosplenomegaly, no distention.  Skin: Intact without lesions or rashes.  Neurologic: Alert and oriented x 3.  Psych: Normal affect. Extremities: No clubbing or cyanosis.  HEENT: Normal.   Assessment/Plan: 1. 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  She had aortic and mitral valve repairs in 2010.   It was felt that she would not be a very good Mitraclip candidate as she already has a degree of mitral stenosis.  TEE in 2/18 showed moderate AS/moderate AI , mild to moderate MS, severe eccentric MR.  Echo in 8/18 showed moderate AS/severe AI but only mild mitral stenosis and mild MR noted. Hemodynamics looked better on 4/18 RHC though cardiac output still marginal.  Repeat TEE 06/28/2017: EF 45%, severe aortic insufficiency, mild to moderate AS, mild mitral valve stenosis s/p MV repair with trivial MR. She had TAVR in 7/19, feeling much better since then.  Echo 6/21 showed stable bioprosthetic aortic valve, s/p mitral valve repair without significant stenosis or regurgitation. Echo 11/22,? partial-valve thrombosis, elevated mean gradient 29 mm Hg, previously 22 mm HG 6/21. Echo in 6/23 showed mean gradient up to 49 mmHg with AVA 0.67 cm^2.  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  Eliquis  was started. She is minimally symptomatic.  - Continue Eliquis 5 mg bid, repeat echo in 3 months to assess for improvement in aortic valve thrombosis.  - She will need antibiotics with dental work.  - Continue ASA 81 daily.  2. Chronic systolic CHF:  Nonischemic cardiomyopathy (no significant disease on 2/18 LHC).  It is possible that this cardiomyopathy is driven by her valvular disease. Echo (8/18) with EF 30-35% RV mildly dilated. TEE 06/28/2017: EF 45%, severe aortic insufficiency, mild to moderate AS, mild mitral valve stenosis s/p MV repair with only trivial MR. Post-TAVR echo showed EF down to 30-35%, and echo in 2/20 showed EF stable in 30-35% range.  This may reflect myocardial dysfunction from prior long-standing aortic insufficiency.  Echo 6/21 showed EF 40-45%. Echo (11/22): EF 35-40%. Echo in 6/23 showed EF 30-35%.  She does not look volume overloaded on exam.  NYHA class I-II symptoms.     - Continue Entresto 49/51 mg bid. BMET today. - Continue Lasix 40 mg daily. - Continue Farxiga 10 mg daily. - Continue spironolactone 25 mg daily.  - Continue Coreg 12.5 mg bid.  - As above, repeat echo at followup in 3 months.  Hopefully, with improved aortic valve function, EF will also improve.  3. CKD stage III: BMET today.  4. PAD: Occluded right EIA likely due to TAVR vascular access complication.  She has mild claudication.    - Followup with [facility].   5. Hyperlipidemia: Continue Crestor, check lipids. 6. Obesity: Losing weight.   Followup 3 months with echo.    Loralie Champagne 11/28/2021

## 2021-12-15 ENCOUNTER — Other Ambulatory Visit (HOSPITAL_COMMUNITY): Payer: Self-pay | Admitting: Cardiology

## 2021-12-15 MED ORDER — ROSUVASTATIN CALCIUM 20 MG PO TABS
20.0000 mg | ORAL_TABLET | Freq: Every day | ORAL | 3 refills | Status: DC
Start: 2021-12-15 — End: 2022-03-15

## 2021-12-16 ENCOUNTER — Encounter: Payer: Self-pay | Admitting: Vascular Surgery

## 2021-12-16 ENCOUNTER — Ambulatory Visit (INDEPENDENT_AMBULATORY_CARE_PROVIDER_SITE_OTHER): Payer: Medicaid Other | Admitting: Vascular Surgery

## 2021-12-16 VITALS — BP 112/63 | HR 68 | Temp 97.9°F | Resp 20 | Ht <= 58 in | Wt 194.0 lb

## 2021-12-16 DIAGNOSIS — I739 Peripheral vascular disease, unspecified: Secondary | ICD-10-CM | POA: Diagnosis not present

## 2021-12-16 NOTE — Progress Notes (Signed)
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  She has stable claudication of the right thigh.  Fortunately she is not a smoker.  I have encouraged her to continue to walk as much as possible to promote collateral development.  If her symptoms progressed then we would have to consider a left to right femoral-femoral bypass.  I think this would be associated with significant risk given her obesity and risk for infection.  In addition her iliofemoral system on the left is fairly small and this may not be ideal for supporting a femoral-femoral graft.  Despite the fact that her iliofemoral system was patent on her CT angiogram last month I really have a difficult time feeling the left femoral pulse.  Regardless, her symptoms currently are quite stable and I think we can simply follow this for now.  I ordered follow-up ABIs in 1 year and I will see her back at that time.  She knows to call sooner if she has problems.  REASON FOR CONSULT:    Peripheral arterial disease.  The consult is requested by Dr. Loralie Champagne.  HPI:   Annalynn Centanni is a 61 y.o. female who was referred with peripheral arterial disease.  I have reviewed the records from the referring office.  The patient was seen on 11/26/2021.  She has a history of rheumatic heart disease and had undergone aortic and mitral valve repairs in 2010.  She subsequently had a TAVR in July 2019.  She developed pain in her right leg and was found to have an occluded right external iliac artery back at that time.  This was based on a CT angiogram.  She was seen by vascular surgery at that time and conservative treatment was recommended.  When she was seen last she was having some mild claudication and therefore was sent for vascular consultation.  On my history the patient describes a long history of claudication in her right thigh.  Her symptoms are brought on by ambulation and  relieved with rest.  She has no symptoms on the left side.  She denies any history of rest pain or nonhealing ulcers.  She is not a smoker.  She is on aspirin and is on a statin.  She is also on Eliquis.  Past Medical History:  Diagnosis Date   Chronic combined systolic and diastolic CHF (congestive heart failure) (HCC)    EF now down to 25-30%. LVEDP on cath was 30 mmHg with wedge pressure of 31 mmHg.   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  mean PA 74/39 mean 52, mean PCWP 31 (with V wave of 45 mmHg), LVEDP ~ 30 mmHg.   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 Progressive worsening of aortic and mitral valve disease.     Family History  Problem Relation Age of Onset   Heart disease Mother    CVA Mother     SOCIAL HISTORY: Social History   Tobacco Use   Smoking status: Former    Packs/day: 0.10    Years: 15.00    Total pack years: 1.50    Types: Cigarettes    Quit date: 2011    Years since quitting: 12.5   Smokeless tobacco: Never  Substance Use Topics   Alcohol use: Never    Allergies  Allergen Reactions   Morphine And Related Other (See Comments)    Hallucinations     Current Outpatient Medications  Medication Sig Dispense Refill   acetaminophen (TYLENOL) 325 MG tablet Take 650 mg every 6 (six) hours as needed by mouth for mild pain.     albuterol (PROVENTIL HFA;VENTOLIN HFA) 108 (90 BASE) MCG/ACT inhaler Inhale 2 puffs into the lungs every 6 (six) hours as needed for shortness of breath.      apixaban (ELIQUIS) 5 MG TABS tablet Take 1 tablet (5 mg total) by mouth 2 (two) times daily. 60 tablet 11   aspirin 81 MG chewable tablet Chew 1 tablet (81 mg total) by mouth daily. 30 tablet 3   beclomethasone (QVAR) 40 MCG/ACT inhaler Inhale 2 puffs into the lungs daily as needed (shortness of breath).      carvedilol (COREG) 12.5 MG tablet TAKE 1 TABLET(6.25 MG) BY MOUTH TWICE DAILY 60 tablet 11   dapagliflozin propanediol (FARXIGA) 10 MG TABS tablet Take 1 tablet (10 mg total) by mouth daily before breakfast. 30 tablet 11   furosemide (LASIX) 40 MG tablet Take 1 tablet (40 mg total) by mouth daily. 30 tablet 5   levothyroxine (SYNTHROID) 125 MCG tablet Take 1 tablet (125 mcg total) by mouth daily before breakfast. Patient takes 1 tablet by mouth on Monday Tuesday Wednesday Thursday Friday Saturday and 1/2 tablets on Sundays. 30 tablet 0   potassium chloride (KLOR-CON) 10 MEQ tablet Take 2 tablets (20 mEq total) by mouth daily. 180 tablet 3   rosuvastatin (CRESTOR) 20 MG tablet Take 1 tablet (20 mg total) by mouth daily. 30 tablet 3   sacubitril-valsartan (ENTRESTO) 49-51 MG Take 1 tablet by  mouth 2 (two) times daily. 60 tablet 6   spironolactone (ALDACTONE) 25 MG tablet TAKE 1 TABLET(25 MG) BY MOUTH DAILY 30 tablet 5   No current facility-administered medications for this visit.    REVIEW OF SYSTEMS:  '[X]'$  denotes positive finding, '[ ]'$  denotes negative finding Cardiac  Comments:  Chest pain or chest pressure:    Shortness of breath upon exertion:    Short of breath when lying flat:    Irregular heart rhythm:        Vascular    Pain in  calf, thigh, or hip brought on by ambulation: x Right thigh  Pain in feet at night that wakes you up from your sleep:     Blood clot in your veins: x   Leg swelling:         Pulmonary    Oxygen at home:    Productive cough:     Wheezing:         Neurologic    Sudden weakness in arms or legs:     Sudden numbness in arms or legs:     Sudden onset of difficulty speaking or slurred speech:    Temporary loss of vision in one eye:     Problems with dizziness:         Gastrointestinal    Blood in stool:     Vomited blood:         Genitourinary    Burning when urinating:     Blood in urine:        Psychiatric    Major depression:         Hematologic    Bleeding problems:    Problems with blood clotting too easily:        Skin    Rashes or ulcers:        Constitutional    Fever or chills:    -  PHYSICAL EXAM:   Vitals:   12/16/21 1325  BP: 112/63  Pulse: 68  Resp: 20  Temp: 97.9 F (36.6 C)  SpO2: 99%  Weight: 194 lb (88 kg)  Height: '4\' 9"'$  (1.448 m)   Body mass index is 41.98 kg/m. GENERAL: The patient is a well-nourished female, in no acute distress. The vital signs are documented above. CARDIAC: There is a regular rate and rhythm.  VASCULAR: I do not detect carotid bruits. I cannot palpate femoral pulses. I cannot palpate pedal pulses.  Both feet however are warm and well-perfused. PULMONARY: There is good air exchange bilaterally without wheezing or rales. ABDOMEN: Soft and non-tender with normal pitched  bowel sounds.  MUSCULOSKELETAL: There are no major deformities. NEUROLOGIC: No focal weakness or paresthesias are detected. SKIN: There are no ulcers or rashes noted. PSYCHIATRIC: The patient has a normal affect.  DATA:    LABS: I reviewed her labs from 11/26/2021.  GFR was 39.  Creatinine 1.53.  ARTERIAL DOPPLER STUDY: I reviewed her arterial Doppler study that was done on 11/16/2021.  On the right side she had a monophasic dorsalis pedis and posterior tibial signal.  ABI was 69%.  Toe pressure was 93 mmHg.  On the left side there was a biphasic posterior tibial signal and a biphasic dorsalis pedis signal.  ABI was 100%.  Toe pressure was 122 mmHg.  ARTERIAL DUPLEX: I reviewed the arterial duplex scan that was done on 11/16/2021.  This showed that the right common femoral artery was occluded.    Deitra Mayo [facility]

## 2022-01-14 ENCOUNTER — Other Ambulatory Visit (HOSPITAL_COMMUNITY): Payer: Self-pay | Admitting: Family Medicine

## 2022-03-14 ENCOUNTER — Other Ambulatory Visit (HOSPITAL_COMMUNITY): Payer: Self-pay | Admitting: Cardiology

## 2022-03-14 ENCOUNTER — Encounter (HOSPITAL_COMMUNITY): Payer: Self-pay

## 2022-03-14 DIAGNOSIS — Z952 Presence of prosthetic heart valve: Secondary | ICD-10-CM

## 2022-03-15 ENCOUNTER — Other Ambulatory Visit (HOSPITAL_COMMUNITY): Payer: Self-pay | Admitting: Cardiology

## 2022-03-16 ENCOUNTER — Ambulatory Visit (HOSPITAL_COMMUNITY): Payer: Medicaid Other

## 2022-03-23 ENCOUNTER — Other Ambulatory Visit (HOSPITAL_COMMUNITY): Payer: Self-pay | Admitting: *Deleted

## 2022-03-23 DIAGNOSIS — I5042 Chronic combined systolic (congestive) and diastolic (congestive) heart failure: Secondary | ICD-10-CM

## 2022-03-23 DIAGNOSIS — N1831 Chronic kidney disease, stage 3a: Secondary | ICD-10-CM

## 2022-03-24 ENCOUNTER — Other Ambulatory Visit (HOSPITAL_COMMUNITY): Payer: Self-pay | Admitting: *Deleted

## 2022-03-24 ENCOUNTER — Telehealth (HOSPITAL_COMMUNITY): Payer: Self-pay | Admitting: Licensed Clinical Social Worker

## 2022-03-24 NOTE — Telephone Encounter (Signed)
H&V Care Navigation CSW Progress Note  Clinical Social Worker consulted to help with ride to lab appts tomorrow.  Able to scheduled uber through [place] to pick pt up at 11:45am.  Patient is participating in a Managed Medicaid Plan:  Yes  [place]   Transportation Needs: Unmet Transportation Needs (03/24/2022)  Tobacco Use: Medium Risk (12/16/2021)     [name], [place] [facility] Desk#: [phone] Cell#: [phone]

## 2022-03-25 ENCOUNTER — Ambulatory Visit (HOSPITAL_COMMUNITY)
Admission: RE | Admit: 2022-03-25 | Discharge: 2022-03-25 | Disposition: A | Discharge status: Home / Self Care | Payer: Medicaid Other | Source: Ambulatory Visit | Attending: Cardiology | Admitting: Cardiology

## 2022-03-25 ENCOUNTER — Ambulatory Visit (HOSPITAL_COMMUNITY): Admission: RE | Admit: 2022-03-25 | Payer: Medicaid Other | Source: Ambulatory Visit

## 2022-03-25 DIAGNOSIS — I5042 Chronic combined systolic (congestive) and diastolic (congestive) heart failure: Secondary | ICD-10-CM | POA: Insufficient documentation

## 2022-03-25 DIAGNOSIS — N1831 Chronic kidney disease, stage 3a: Secondary | ICD-10-CM | POA: Insufficient documentation

## 2022-03-25 LAB — BASIC METABOLIC PANEL
Anion gap: 11 (ref 5–15)
BUN: 31 mg/dL — ABNORMAL HIGH (ref 6–20)
CO2: 22 mmol/L (ref 22–32)
Calcium: 9.2 mg/dL (ref 8.9–10.3)
Chloride: 101 mmol/L (ref 98–111)
Creatinine, Ser: 1.73 mg/dL — ABNORMAL HIGH (ref 0.44–1.00)
GFR, Estimated: 33 mL/min — ABNORMAL LOW (ref >60)
Glucose, Bld: 128 mg/dL — ABNORMAL HIGH (ref 70–99)
Potassium: 4.6 mmol/L (ref 3.5–5.1)
Sodium: 134 mmol/L — ABNORMAL LOW (ref 135–145)

## 2022-03-26 ENCOUNTER — Other Ambulatory Visit (HOSPITAL_COMMUNITY): Payer: Self-pay | Admitting: Cardiology

## 2022-03-26 DIAGNOSIS — Z952 Presence of prosthetic heart valve: Secondary | ICD-10-CM

## 2022-03-28 ENCOUNTER — Telehealth (HOSPITAL_COMMUNITY): Payer: Self-pay

## 2022-03-28 DIAGNOSIS — I5042 Chronic combined systolic (congestive) and diastolic (congestive) heart failure: Secondary | ICD-10-CM

## 2022-03-28 MED ORDER — FUROSEMIDE 20 MG PO TABS: 20.0000 mg | ORAL_TABLET | Freq: Every day | ORAL | 5 refills | Status: DC

## 2022-03-28 NOTE — Telephone Encounter (Signed)
Patient advised and verbalized understanding,lab appointment scheduled,lab orders entered. Med list updated to reflect changes.   Meds ordered this encounter  Medications   furosemide (LASIX) 20 MG tablet    Sig: Take 1 tablet (20 mg total) by mouth daily.    Dispense:  30 tablet    Refill:  5    Please cancel all previous orders for current medication. Change in dosage or pill size.   Orders Placed This Encounter  Procedures   Basic metabolic panel    Standing Status:   Future    Standing Expiration Date:   03/29/2023    Order Specific Question:   Release to patient    Answer:   Immediate    Order Specific Question:   Release to patient    Answer:   Immediate [1]

## 2022-03-28 NOTE — Telephone Encounter (Signed)
-----   Message from Larey Dresser, MD sent at 03/27/2022  7:39 PM EST ----- Decreased Lasix to 20 mg daily.  BMET 10 days.

## 2022-04-06 ENCOUNTER — Telehealth (HOSPITAL_COMMUNITY): Payer: Self-pay | Admitting: Emergency Medicine

## 2022-04-06 ENCOUNTER — Telehealth (HOSPITAL_COMMUNITY): Payer: Self-pay | Admitting: *Deleted

## 2022-04-06 NOTE — Telephone Encounter (Signed)
Left detailed message about wanting to review instructions for scan and also to inform her that she does not have an auth at this time and her appt could be postponed if we do not get an authorization before EOB today.  Requested her callback to my office [facility] [phone] Office  [phone] Cell

## 2022-04-06 NOTE — Telephone Encounter (Signed)
Patient returning call regarding upcoming cardiac imaging study; pt verbalizes understanding of appt date/time, parking situation and where to check in, , and verified current allergies; name and call back number provided for further questions should they arise  Gordy Clement RN Navigator [facility] [phone] office [phone] cell  Patient to take her daily carvedilol and hold her diuretics. She is aware to arrive at 2:30pm.

## 2022-04-07 ENCOUNTER — Ambulatory Visit (HOSPITAL_COMMUNITY)
Admission: RE | Admit: 2022-04-07 | Discharge: 2022-04-07 | Disposition: A | Discharge status: Home / Self Care | Payer: Medicaid Other | Source: Ambulatory Visit | Attending: Internal Medicine | Admitting: Internal Medicine

## 2022-04-07 ENCOUNTER — Ambulatory Visit (HOSPITAL_COMMUNITY)
Admission: RE | Admit: 2022-04-07 | Discharge: 2022-04-07 | Disposition: A | Discharge status: Home / Self Care | Payer: Medicaid Other | Source: Ambulatory Visit | Attending: Cardiology | Admitting: Cardiology

## 2022-04-07 DIAGNOSIS — Z952 Presence of prosthetic heart valve: Secondary | ICD-10-CM

## 2022-04-07 DIAGNOSIS — I5042 Chronic combined systolic (congestive) and diastolic (congestive) heart failure: Secondary | ICD-10-CM | POA: Insufficient documentation

## 2022-04-07 LAB — BASIC METABOLIC PANEL
Anion gap: 7 (ref 5–15)
BUN: 18 mg/dL (ref 6–20)
CO2: 21 mmol/L — ABNORMAL LOW (ref 22–32)
Calcium: 8.7 mg/dL — ABNORMAL LOW (ref 8.9–10.3)
Chloride: 108 mmol/L (ref 98–111)
Creatinine, Ser: 1.06 mg/dL — ABNORMAL HIGH (ref 0.44–1.00)
GFR, Estimated: >60 mL/min (ref >60)
Glucose, Bld: 90 mg/dL (ref 70–99)
Potassium: 4.3 mmol/L (ref 3.5–5.1)
Sodium: 136 mmol/L (ref 135–145)

## 2022-04-07 MED ORDER — IOHEXOL 350 MG/ML SOLN
100.0000 mL | Freq: Once | INTRAVENOUS | Status: AC | PRN
  Administered 2022-04-07: 100 mL via INTRAVENOUS

## 2022-04-13 ENCOUNTER — Telehealth (HOSPITAL_COMMUNITY): Payer: Self-pay | Admitting: *Deleted

## 2022-04-29 ENCOUNTER — Other Ambulatory Visit (HOSPITAL_COMMUNITY): Payer: Self-pay

## 2022-04-29 ENCOUNTER — Telehealth (HOSPITAL_COMMUNITY): Payer: Self-pay

## 2022-04-29 DIAGNOSIS — I5022 Chronic systolic (congestive) heart failure: Secondary | ICD-10-CM

## 2022-04-29 MED ORDER — POTASSIUM CHLORIDE ER 10 MEQ PO TBCR: 20.0000 meq | EXTENDED_RELEASE_TABLET | Freq: Every day | ORAL | 3 refills | Status: DC

## 2022-04-29 NOTE — Telephone Encounter (Signed)
Patient aware of results.

## 2022-05-24 ENCOUNTER — Other Ambulatory Visit (HOSPITAL_COMMUNITY): Payer: Self-pay

## 2022-05-24 DIAGNOSIS — I5043 Acute on chronic combined systolic (congestive) and diastolic (congestive) heart failure: Secondary | ICD-10-CM

## 2022-05-25 ENCOUNTER — Encounter (HOSPITAL_COMMUNITY): Payer: Self-pay | Admitting: Cardiology

## 2022-05-25 ENCOUNTER — Other Ambulatory Visit (HOSPITAL_COMMUNITY): Payer: Self-pay

## 2022-05-25 ENCOUNTER — Ambulatory Visit (HOSPITAL_COMMUNITY)
Admission: RE | Admit: 2022-05-25 | Discharge: 2022-05-25 | Disposition: A | Discharge status: Home / Self Care | Payer: Medicaid Other | Source: Ambulatory Visit | Attending: Cardiology | Admitting: Cardiology

## 2022-05-25 ENCOUNTER — Ambulatory Visit (HOSPITAL_BASED_OUTPATIENT_CLINIC_OR_DEPARTMENT_OTHER)
Admission: RE | Admit: 2022-05-25 | Discharge: 2022-05-25 | Disposition: A | Discharge status: Home / Self Care | Payer: Medicaid Other | Source: Ambulatory Visit | Attending: Cardiology | Admitting: Cardiology

## 2022-05-25 VITALS — BP 94/60 | HR 62 | Wt 191.8 lb

## 2022-05-25 DIAGNOSIS — I739 Peripheral vascular disease, unspecified: Secondary | ICD-10-CM | POA: Diagnosis not present

## 2022-05-25 DIAGNOSIS — I428 Other cardiomyopathies: Secondary | ICD-10-CM | POA: Insufficient documentation

## 2022-05-25 DIAGNOSIS — E669 Obesity, unspecified: Secondary | ICD-10-CM | POA: Insufficient documentation

## 2022-05-25 DIAGNOSIS — Z7982 Long term (current) use of aspirin: Secondary | ICD-10-CM | POA: Insufficient documentation

## 2022-05-25 DIAGNOSIS — I5022 Chronic systolic (congestive) heart failure: Secondary | ICD-10-CM | POA: Diagnosis not present

## 2022-05-25 DIAGNOSIS — Z5982 Transportation insecurity: Secondary | ICD-10-CM | POA: Insufficient documentation

## 2022-05-25 DIAGNOSIS — Z8249 Family history of ischemic heart disease and other diseases of the circulatory system: Secondary | ICD-10-CM | POA: Diagnosis not present

## 2022-05-25 DIAGNOSIS — I130 Hypertensive heart and chronic kidney disease with heart failure and stage 1 through stage 4 chronic kidney disease, or unspecified chronic kidney disease: Secondary | ICD-10-CM | POA: Diagnosis not present

## 2022-05-25 DIAGNOSIS — Z823 Family history of stroke: Secondary | ICD-10-CM | POA: Diagnosis not present

## 2022-05-25 DIAGNOSIS — E785 Hyperlipidemia, unspecified: Secondary | ICD-10-CM | POA: Insufficient documentation

## 2022-05-25 DIAGNOSIS — Z79899 Other long term (current) drug therapy: Secondary | ICD-10-CM | POA: Insufficient documentation

## 2022-05-25 DIAGNOSIS — E039 Hypothyroidism, unspecified: Secondary | ICD-10-CM | POA: Diagnosis not present

## 2022-05-25 DIAGNOSIS — Z7984 Long term (current) use of oral hypoglycemic drugs: Secondary | ICD-10-CM | POA: Diagnosis not present

## 2022-05-25 DIAGNOSIS — I5042 Chronic combined systolic (congestive) and diastolic (congestive) heart failure: Secondary | ICD-10-CM

## 2022-05-25 DIAGNOSIS — I050 Rheumatic mitral stenosis: Secondary | ICD-10-CM | POA: Insufficient documentation

## 2022-05-25 DIAGNOSIS — Z7901 Long term (current) use of anticoagulants: Secondary | ICD-10-CM | POA: Insufficient documentation

## 2022-05-25 DIAGNOSIS — I062 Rheumatic aortic stenosis with insufficiency: Secondary | ICD-10-CM

## 2022-05-25 DIAGNOSIS — Z953 Presence of xenogenic heart valve: Secondary | ICD-10-CM | POA: Insufficient documentation

## 2022-05-25 DIAGNOSIS — N183 Chronic kidney disease, stage 3 unspecified: Secondary | ICD-10-CM | POA: Insufficient documentation

## 2022-05-25 DIAGNOSIS — I5043 Acute on chronic combined systolic (congestive) and diastolic (congestive) heart failure: Secondary | ICD-10-CM | POA: Diagnosis not present

## 2022-05-25 LAB — BASIC METABOLIC PANEL
Anion gap: 7 (ref 5–15)
BUN: 32 mg/dL — ABNORMAL HIGH (ref 6–20)
CO2: 22 mmol/L (ref 22–32)
Calcium: 8.7 mg/dL — ABNORMAL LOW (ref 8.9–10.3)
Chloride: 104 mmol/L (ref 98–111)
Creatinine, Ser: 1.28 mg/dL — ABNORMAL HIGH (ref 0.44–1.00)
GFR, Estimated: 48 mL/min — ABNORMAL LOW (ref >60)
Glucose, Bld: 108 mg/dL — ABNORMAL HIGH (ref 70–99)
Potassium: 4.6 mmol/L (ref 3.5–5.1)
Sodium: 133 mmol/L — ABNORMAL LOW (ref 135–145)

## 2022-05-25 LAB — ECHOCARDIOGRAM COMPLETE
AR max vel: 0.85 cm2
AV Area VTI: 0.77 cm2
AV Area mean vel: 0.76 cm2
AV Mean grad: 13 mmHg
AV Peak grad: 21.5 mmHg
Ao pk vel: 2.32 m/s
Area-P 1/2: 1.88 cm2
MV VTI: 0.8 cm2
S' Lateral: 3 cm

## 2022-05-25 LAB — BRAIN NATRIURETIC PEPTIDE: B Natriuretic Peptide: 185.1 pg/mL — ABNORMAL HIGH (ref 0.0–100.0)

## 2022-05-25 MED ORDER — CARVEDILOL 12.5 MG PO TABS: 12.5000 mg | ORAL_TABLET | Freq: Two times a day (BID) | ORAL | 3 refills | Status: DC

## 2022-05-25 MED ORDER — DAPAGLIFLOZIN PROPANEDIOL 10 MG PO TABS: 10.0000 mg | ORAL_TABLET | Freq: Every day | ORAL | 3 refills | Status: DC

## 2022-05-25 MED ORDER — PERFLUTREN LIPID MICROSPHERE
1.0000 mL | INTRAVENOUS | Status: DC | PRN
  Administered 2022-05-25: 3 mL via INTRAVENOUS
  Filled 2022-05-25: qty 10

## 2022-05-25 NOTE — Progress Notes (Signed)
Advanced Heart Failure Clinic Note   PCP: Patient, No Pcp Per Cardiology: Dr. Ellyn Hack HF Cardiology: Dr. Aundra Dubin  62 y.o. 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  She had aortic and mitral repairs in 2010.  Afterwards, EF was mildly low in the 40-45% range.  However, EF had dropped to 25-30% on 1/18 echo.  TEE showed severe MR, moderate AI, moderate aortic stenosis.  RHC/LHC in 2/18 showed significantly elevated LV and RV filling pressures.    At her initial appointment here, she was very dyspneic with exertion, with orthopnea and PND. Her meds were adjusted and Lasix increased.  She has seen Dr. Roxy Manns since that time for evaluation for redo valve surgery.  She would require high risk mitral and aortic valve replacements. For now, the plan will be close monitoring with surgery if starts to decline or has CHF exacerbation. CPX was done in 3/18 showing moderate to severely decreased functional capacity.  RHC in 4/18 showed optimized filling pressures but low cardiac output.  Repeat echo in 8/18 showed EF 30-35%, severe AI with moderate AS but only mild MR noted.  The RV was mildly dilated with mildly decreased systolic function.   TEE 06/28/2017: EF 45%, severe AI, mild to moderate AS, mild mitral valve stenosis s/p MV repair  In 7/19, she had TAVR with #23 Edwards Sapien 3 THV.  Echo post-op in 7/19 showed EF 30-35%, moderate LVH, bioprosthetic aortic valve looked ok, s/p MV repair with mild mitral stenosis.   She developed pain in her right leg and was found to have occluded right EIA on dopplers in 9/19, confirmed by CTA. Suspect this was a vascular access complication of TAVR.  She was seen by Dr. Donnetta Hutching, conservative treatment for now.   Echo in 2/20 showed that EF remains 30-35%, stable TAVR valve, s/p MVR repair with no regurgitation or significant stenosis.   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 There was concern for possible partial valve thrombosis and gated CT ordered to assess aortic valve.  Echo in 6/23 showed EF 30-35% with wall motion abnormalities, moderate RV dysfunction, s/p mitral valve repair with trivial MR and mean gradient 4 mmHg, bioprosthetic aortic valve s/p TAVR with mean gradient 49 mmHg and AVA 0.67 cm^2. 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 Patient was started on Eliquis.   Repeat gated CTA chest on apixaban in 11/23 showed minimal restriction to bioprosthetic aortic valve excursion with no significant thrombus, significant improvement.  Echo was done today and reviewed, showing EF up to 40-45% with apical hypokinesis, mild RV dysfunction, mild mitral stenosis s/p MV repair with mean gradient 6 mmHg, bioprosthetic aortic valve with mean gradient 13 mmHg (improved), normal IVC.   Patient returns for followup of CHF.  She is doing well symptomatically. Weight down 1 lb.  No significant exertional dyspnea with usual activities.  No chest pain.  No lightheadedness or palpitations.  She has been walking more, now with minimal right thigh claudication.   Labs (2/18): K 3.8 => 3.4, creatinine 1.09 => 1.07, hgb 12.7, BNP 1010  Labs (3/18): K 4.6, creatinine 2.43 => 1.74 Labs (4/18): K 4, creatinine 1.28, digoxin 0.3 Labs 09/2016: K 4.4, creatinine 1.26.  Labs 01/19/2017: K 4.4, Creatinine 1.21, digoxin 0.3 Labs (10/18): K 4.7, creatinine 1.10, digoxin 0.3 Labs (12/18): K 4.9, creatinine 1.34, digoxin < 0.2 Labs (8/19): K 3.9, creatinine 1.33 Labs (9/19): K 3.7, creatinine 1.46 Labs (7/21): K 4.9, creatinine 1.38 Labs  (8/22): K 3.8, creatinine 1.65, LDL 174 Labs (11/22): K 4.3, creatinine 1.21, LDL 30, LFTs ok Labs (2/23): K 4.3, creatinine 1.41 Labs (5/23): K 4.1, creatinine 1.49, BNP 315 Labs (7/23): LDL 45 Labs (11/23): K 4.3, creatinine 1.06  PMH: 1. HTN 2. Hypothyroidism 3. Hyperlipidemia 4. Rheumatic heart disease: s/p MV repair and aortic valve repair in 2010, Dr. Roxy Manns. - TEE (2/18): EF 35-30%, dilated LV, rheumatic-appearing aortic valve s/p repair with moderate AS (AVA 1.1 cm^2), moderate AI.  MV s/p repair with mild-moderate mitral stenosis and eccentric severe MR.   - Echo (8/18): Severe AI but only moderate AS, MR was only mild on this study (may have missed full jet) with mean MV gradient 4 mmHg.  - TEE 06/28/2017: EF 45%, severe AI, mild to moderate AS, mild mitral valve stenosis s/p MV repair - In 7/19, she had TAVR with #23 Edwards Sapien 3 THV.   - Echo post-op in 7/19 showed EF 30-35%, moderate LVH, bioprosthetic aortic valve looked ok, s/p MV repair with mild mitral stenosis.  - Echo in 11/22 showed TAVR valve with significant stenosis and regurgitation => mild-moderate PVL with mean gradient 29 mmHg and DI 0.22.  There was mild stenosis of the repaired mitral valve.  - Gated CTA chest (6/23) showed HALT/HAM involving all 3 leaflets of the bioprosthetic aortic valve with restriction to motion - Gated CTA chest (11/23, on apixaban): minimal restriction to bioprosthetic aortic valve excursion with no significant thrombus, significant improvement.   5. Chronic systolic CHF: Nonischemic cardiomyopathy, probably related to valvular disease.   - Echo (11/15): EF 40-45%, - Echo (12/16): EF 35-40%. - Echo (1/18): EF 25-30%.  - LHC/RHC (2/18): Normal coronaries, mean RA 18, mean PA 74/39 mean 52, mean PCWP 31, CI 1.98 Fick. - CPX (3/18): peak VO2 11.7, VE/VCO2 slope 40, RER 1.11 => moderate to severely decreased functional capacity due to HF.  - RHC (4/18): mean RA 4, PA 44/17 mean 28, mean PCWP  15, CI 1.73, PVR 4 WU.  - Echo (8/18): EF 30-35%, severe AI with moderate AS but only mild MR noted with mean MV gradient 4 mmHg.  The RV was mildly dilated with mildly decreased systolic function. - TEE 2/63: EF 45%, severe aortic insufficiency, mild to moderate AS, mild mitral valve stenosis s/p MV repair - Coronary CTA (5/19): No obstructive coronary disease.  - Echo (7/19): EF 30-35%, moderate LVH, bioprosthetic aortic valve looked ok, s/p MV repair with mild mitral stenosis.  - Echo (2/20): EF 30-35%, moderate LVH with diffuse hypokinesis, mildly decreased RV systolic function, bioprosthetic aortic valve s/p TAVR without significant stenosis or regurgitation, s/p MV repair with mean gradient 3 mmHg and no MR.  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  - Echo (6/23):  EF 30-35% with wall motion abnormalities, moderate RV dysfunction, s/p mitral valve repair with trivial MR and mean gradient 4 mmHg, bioprosthetic aortic valve  s/p TAVR with mean gradient 49 mmHg and AVA 0.67 cm^2.  - Echo (1/24): EF 40-45% with apical hypokinesis, mild RV dysfunction, mild mitral stenosis s/p MV repair with mean gradient 6 mmHg, bioprosthetic aortic valve with mean gradient 13 mmHg (improved), normal IVC.  6. Carotid dopplers (6/19): No significant disease.  7. PAD: 9/19 peripheral arterial dopplers showed total occluded proximal right external iliac artery.  - CTA confirmed total occlusion of R EIA with collaterals.  Suspect TAVR vascular access complication.  - Peripheral arterial dopplers (6/23): totally occluded right mid to distal R CFA.  Social History   Socioeconomic History   Marital status: Single    Spouse name: Not on file   Number of children: Not on file   Years of education: Not on file    Highest education level: Not on file  Occupational History   Not on file  Tobacco Use   Smoking status: Former    Packs/day: 0.10    Years: 15.00    Total pack years: 1.50    Types: Cigarettes    Quit date: 2011    Years since quitting: 13.0   Smokeless tobacco: Never  Vaping Use   Vaping Use: Never used  Substance and Sexual Activity   Alcohol use: Never   Drug use: Never   Sexual activity: Not Currently  Other Topics Concern   Not on file  Social History Narrative   Unemployed, on disability.   Now is finally reestablished with a PCP.   She is a former smoker, quit in 2011. Denies alcohol consumption   Tries to walk routinely.   Social Determinants of Health   Financial Resource Strain: Not on file  Food Insecurity: Not on file  Transportation Needs: Unmet Transportation Needs (03/24/2022)   PRAPARE - Hydrologist (Medical): Yes    Lack of Transportation (Non-Medical): Yes  Physical Activity: Not on file  Stress: Not on file  Social Connections: Not on file  Intimate Partner Violence: Not on file   Family History  Problem Relation Age of Onset   Heart disease Mother    CVA Mother    Review of systems complete and found to be negative unless listed in HPI.    Current Outpatient Medications  Medication Sig Dispense Refill   acetaminophen (TYLENOL) 325 MG tablet Take 650 mg every 6 (six) hours as needed by mouth for mild pain.     albuterol (PROVENTIL HFA;VENTOLIN HFA) 108 (90 BASE) MCG/ACT inhaler Inhale 2 puffs into the lungs every 6 (six) hours as needed for shortness of breath.      apixaban (ELIQUIS) 5 MG TABS tablet Take 1 tablet (5 mg total) by mouth 2 (two) times daily. 60 tablet 11   aspirin 81 MG chewable tablet Chew 1 tablet (81 mg total) by mouth daily. 30 tablet 3   beclomethasone (QVAR) 40 MCG/ACT inhaler Inhale 2 puffs into the lungs daily as needed (shortness of breath).     dapagliflozin propanediol (FARXIGA) 10 MG TABS  tablet Take 1 tablet (10 mg total) by mouth daily before breakfast. 90 tablet 3   ENTRESTO 49-51 MG TAKE 1 TABLET BY MOUTH TWICE DAILY 60 tablet 6   levothyroxine (SYNTHROID) 125 MCG tablet Take 125 mcg by mouth daily before breakfast. Except sunday     rosuvastatin (CRESTOR) 20 MG tablet TAKE 1 TABLET(20 MG) BY MOUTH DAILY 30 tablet 3   spironolactone (ALDACTONE) 25 MG tablet TAKE 1 TABLET(25 MG) BY MOUTH DAILY  30 tablet 5   carvedilol (COREG) 12.5 MG tablet Take 1 tablet (12.5 mg total) by mouth 2 (two) times daily with a meal. 180 tablet 3   No current facility-administered medications for this encounter.   BP 94/60   Pulse 62   Wt 87 kg (191 lb 12.8 oz)   SpO2 98%   BMI 41.51 kg/m   Wt Readings from Last 3 Encounters:  05/25/22 87 kg (191 lb 12.8 oz)  12/16/21 88 kg (194 lb)  11/26/21 87.1 kg (192 lb)   General: NAD Neck: No JVD, no thyromegaly or thyroid nodule.  Lungs: Clear to auscultation bilaterally with normal respiratory effort. CV: Nondisplaced PMI.  Heart regular S1/S2, no S3/S4, 1/6 early SEM RUSB.  No peripheral edema.  No carotid bruit.  Normal pedal pulses.  Abdomen: Soft, nontender, no hepatosplenomegaly, no distention.  Skin: Intact without lesions or rashes.  Neurologic: Alert and oriented x 3.  Psych: Normal affect. Extremities: No clubbing or cyanosis.  HEENT: Normal.   Assessment/Plan: 1. 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  She had aortic and mitral valve repairs in 2010.   It was felt that she would not be a very good Mitraclip candidate as she already has a degree of mitral stenosis.  TEE in 2/18 showed moderate AS/moderate AI , mild to moderate MS, severe eccentric MR.  Echo in 8/18 showed moderate AS/severe AI but only mild mitral stenosis and mild MR noted. Hemodynamics looked better on 4/18 RHC though cardiac output still marginal.  Repeat TEE 06/28/2017: EF 45%, severe aortic insufficiency, mild to moderate AS, mild mitral valve stenosis s/p MV  repair with trivial MR. She had TAVR in 7/19, feeling much better since then.  Echo 6/21 showed stable bioprosthetic aortic valve, s/p mitral valve repair without significant stenosis or regurgitation. Echo 11/22,? partial-valve thrombosis, elevated mean gradient 29 mm Hg, previously 22 mm HG 6/21. Echo in 6/23 showed mean gradient up to 49 mmHg with AVA 0.67 cm^2.  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  Eliquis was started. Repeat gated CTA chest in 11/23 showed no significant restriction to bioprosthetic aortic valve excursion with no significant thrombus => marked improvement.  Echo today showed mean gradient down to 13 mmHg across the aortic valve.  - She will need to continue Eliquis 5 mg bid long-term.  - She will need antibiotics with dental work.  - Continue ASA 81 daily.  2. Chronic systolic CHF:  Nonischemic cardiomyopathy (no significant disease on 2/18 LHC).  It is possible that this cardiomyopathy is driven by her valvular disease. Echo (8/18) with EF 30-35% RV mildly dilated. TEE 06/28/2017: EF 45%, severe aortic insufficiency, mild to moderate AS, mild mitral valve stenosis s/p MV repair with only trivial MR. Post-TAVR echo showed EF down to 30-35%, and echo in 2/20 showed EF stable in 30-35% range.  This may reflect myocardial dysfunction from prior long-standing aortic insufficiency.  Echo 6/21 showed EF 40-45%. Echo (11/22): EF 35-40%. Echo in 6/23 showed EF 30-35%.  Echo today was improved with EF 40-45% with apical hypokinesis, mild RV dysfunction, mild mitral stenosis s/p MV repair with mean gradient 6 mmHg, bioprosthetic aortic valve with mean gradient 13 mmHg (improved), normal IVC.  She is not volume overloaded on exam, NYHA class I-II.     - Continue Entresto 49/51 mg bid. BMET today. - Stop Lasix and KCl and  start Farxiga 10 mg daily. BMET/BNP today,  BMET in 10 days.  - Continue spironolactone 25 mg daily.  - Continue Coreg 12.5 mg bid.  3. CKD stage III: BMET today.  4. PAD: Occluded right EIA likely due to TAVR vascular access complication.  Occluded right CFA on last doppler study. Has seen [facility], plan for medical management.  Minimal claudication currently.   - Followup with [facility].   5. Hyperlipidemia: Continue Crestor, good lipids in 7/23.  6. Obesity: Consider semaglutide in future.   Followup 3 months with APP.     Loralie Champagne 05/25/2022

## 2022-05-25 NOTE — Patient Instructions (Addendum)
STOP Lasix  STOP Potassium  START Farxiga 10 mg daily.  Labs done today, your results will be available in MyChart, we will contact you for abnormal readings.   Repeat blood work in 10 days  Your physician recommends that you schedule a follow-up appointment in: 3 months  If you have any questions or concerns before your next appointment please send [place] a message through [place] or call our office at [phone].    TO LEAVE A MESSAGE FOR THE NURSE SELECT OPTION 2, PLEASE LEAVE A MESSAGE INCLUDING: YOUR NAME DATE OF BIRTH CALL BACK NUMBER REASON FOR CALL**this is important as we prioritize the call backs  YOU WILL RECEIVE A CALL BACK THE SAME DAY AS LONG AS YOU CALL BEFORE 4:00 PM  At the [facility], you and your health needs are our priority. As part of our continuing mission to provide you with exceptional heart care, we have created designated Provider Care Teams. These Care Teams include your primary Cardiologist (physician) and Advanced Practice Providers (APPs- Physician Assistants and Nurse Practitioners) who all work together to provide you with the care you need, when you need it.   You may see any of the following providers on your designated Care Team at your next follow up: Dr Glori Bickers Dr Loralie Champagne Dr. Roxana Hires, NP Lyda Jester, [place] [facility] [place], [place] Forestine Na, NP Audry Riles, PharmD   Please be sure to bring in all your medications bottles to every appointment.

## 2022-05-25 NOTE — Progress Notes (Signed)
  Echocardiogram 2D Echocardiogram has been performed.  [name] 05/25/2022, 1:51 PM

## 2022-05-31 ENCOUNTER — Telehealth (HOSPITAL_COMMUNITY): Payer: Self-pay

## 2022-05-31 NOTE — Telephone Encounter (Signed)
If's she not itching or wheezing, would continue for now.  If it does not resolve in 3-4 days, can try stopping [place].

## 2022-05-31 NOTE — Telephone Encounter (Signed)
Patient states that she thinks the [place] is making her have a rash on her neck. Please advise

## 2022-05-31 NOTE — Telephone Encounter (Signed)
Pt aware and voiced understanding 

## 2022-06-01 ENCOUNTER — Telehealth (HOSPITAL_COMMUNITY): Payer: Self-pay | Admitting: Licensed Clinical Social Worker

## 2022-06-01 DIAGNOSIS — I5022 Chronic systolic (congestive) heart failure: Secondary | ICD-10-CM

## 2022-06-01 NOTE — Telephone Encounter (Signed)
H&V Care Navigation CSW Progress Note  Clinical Social Worker received call from pt to request help with transportation to lab appt on Friday.  Pt has managed medicaid but is less than 3 business days from appt so unsure if they will schedule ride- encouraged pt to call to attempt.  CSW also informed pt we would place a Managed Medicaid order to assist with transportation as well in case she is unable to schedule a ride.  Patient is participating in a Managed Medicaid Plan:  Yes  [place]   Transportation Needs: Unmet Transportation Needs (06/01/2022)  Tobacco Use: Medium Risk (05/25/2022)    Jorge Ny, [facility] Desk#: [phone] Cell#: [phone]

## 2022-06-03 ENCOUNTER — Ambulatory Visit (HOSPITAL_COMMUNITY)
Admission: RE | Admit: 2022-06-03 | Discharge: 2022-06-03 | Disposition: A | Discharge status: Home / Self Care | Payer: Medicaid Other | Source: Ambulatory Visit | Attending: Cardiology | Admitting: Cardiology

## 2022-06-03 ENCOUNTER — Telehealth (HOSPITAL_COMMUNITY): Payer: Self-pay | Admitting: Licensed Clinical Social Worker

## 2022-06-03 DIAGNOSIS — I5022 Chronic systolic (congestive) heart failure: Secondary | ICD-10-CM | POA: Diagnosis present

## 2022-06-03 LAB — BASIC METABOLIC PANEL
Anion gap: 7 (ref 5–15)
BUN: 12 mg/dL (ref 6–20)
CO2: 23 mmol/L (ref 22–32)
Calcium: 8.4 mg/dL — ABNORMAL LOW (ref 8.9–10.3)
Chloride: 108 mmol/L (ref 98–111)
Creatinine, Ser: 1.07 mg/dL — ABNORMAL HIGH (ref 0.44–1.00)
GFR, Estimated: 59 mL/min — ABNORMAL LOW (ref >60)
Glucose, Bld: 93 mg/dL (ref 70–99)
Potassium: 4.2 mmol/L (ref 3.5–5.1)
Sodium: 138 mmol/L (ref 135–145)

## 2022-06-03 NOTE — Telephone Encounter (Signed)
CSW called pt to check if she had been able to obtain a ride.  Pt confirmed she was able to get a ride to the lab appt today so was no longer in need of assistance.  Will continue to follow and assist as needed  Jorge Ny, [facility] Desk#: [phone] Cell#: [phone]

## 2022-06-13 ENCOUNTER — Other Ambulatory Visit (HOSPITAL_COMMUNITY): Payer: Self-pay | Admitting: Cardiology

## 2022-06-13 ENCOUNTER — Telehealth (HOSPITAL_COMMUNITY): Payer: Self-pay

## 2022-06-13 NOTE — Telephone Encounter (Signed)
Patient called again with symptoms from [place] including wheezing. I advised her of previous note where it was indicated to stop [place] if she continued with symptoms. I asked her to call us and let us if symptoms get better.

## 2022-07-11 ENCOUNTER — Telehealth (HOSPITAL_COMMUNITY): Payer: Self-pay

## 2022-07-11 NOTE — Telephone Encounter (Signed)
Patient called and stated she has been wheezing, some tightness when breathing. She denies swelling, shortness of breath or weight gain. She is feeling pretty good, but she is no longer using her inhalers. It was recommended she go to an urgent care to follow up and see if she needs to start those  back because they have expired. She agreed and is going to follow up there and then let us know if symptoms worsen or change.

## 2022-07-18 ENCOUNTER — Telehealth (HOSPITAL_COMMUNITY): Payer: Self-pay | Admitting: *Deleted

## 2022-07-18 NOTE — Telephone Encounter (Signed)
Start back on Lasix 20 mg daily. BMET 1 week.

## 2022-07-18 NOTE — Telephone Encounter (Signed)
Pt called c/o swelling in ankles and that her weight is up 3lbs since she called last week. Pt said she notices some wheezing.  Pt said she was taken off her diuretic but thinks she may need it again.  Routed to [place] for advice

## 2022-07-20 NOTE — Telephone Encounter (Signed)
No answer, Left message to return call.  

## 2022-07-25 NOTE — Telephone Encounter (Signed)
Left message for her to call back

## 2022-08-10 ENCOUNTER — Other Ambulatory Visit (HOSPITAL_COMMUNITY): Payer: Self-pay

## 2022-08-10 MED ORDER — ENTRESTO 49-51 MG PO TABS: 1.0000 | ORAL_TABLET | Freq: Two times a day (BID) | ORAL | 11 refills | Status: DC

## 2022-08-25 ENCOUNTER — Ambulatory Visit (HOSPITAL_COMMUNITY)
Admission: RE | Admit: 2022-08-25 | Discharge: 2022-08-25 | Disposition: A | Discharge status: Home / Self Care | Payer: Medicaid Other | Source: Ambulatory Visit | Attending: Cardiology | Admitting: Cardiology

## 2022-08-25 ENCOUNTER — Telehealth (HOSPITAL_COMMUNITY): Payer: Self-pay | Admitting: Cardiology

## 2022-08-25 ENCOUNTER — Encounter (HOSPITAL_COMMUNITY): Payer: Self-pay

## 2022-08-25 VITALS — BP 110/80 | HR 84 | Wt 193.2 lb

## 2022-08-25 DIAGNOSIS — Z87891 Personal history of nicotine dependence: Secondary | ICD-10-CM | POA: Insufficient documentation

## 2022-08-25 DIAGNOSIS — I052 Rheumatic mitral stenosis with insufficiency: Secondary | ICD-10-CM | POA: Insufficient documentation

## 2022-08-25 DIAGNOSIS — I130 Hypertensive heart and chronic kidney disease with heart failure and stage 1 through stage 4 chronic kidney disease, or unspecified chronic kidney disease: Secondary | ICD-10-CM | POA: Insufficient documentation

## 2022-08-25 DIAGNOSIS — I272 Pulmonary hypertension, unspecified: Secondary | ICD-10-CM

## 2022-08-25 DIAGNOSIS — Z7901 Long term (current) use of anticoagulants: Secondary | ICD-10-CM | POA: Insufficient documentation

## 2022-08-25 DIAGNOSIS — Z79899 Other long term (current) drug therapy: Secondary | ICD-10-CM | POA: Insufficient documentation

## 2022-08-25 DIAGNOSIS — Z6841 Body Mass Index (BMI) 40.0 and over, adult: Secondary | ICD-10-CM | POA: Insufficient documentation

## 2022-08-25 DIAGNOSIS — Z7951 Long term (current) use of inhaled steroids: Secondary | ICD-10-CM | POA: Diagnosis not present

## 2022-08-25 DIAGNOSIS — Z7984 Long term (current) use of oral hypoglycemic drugs: Secondary | ICD-10-CM | POA: Insufficient documentation

## 2022-08-25 DIAGNOSIS — E039 Hypothyroidism, unspecified: Secondary | ICD-10-CM | POA: Diagnosis not present

## 2022-08-25 DIAGNOSIS — E669 Obesity, unspecified: Secondary | ICD-10-CM | POA: Insufficient documentation

## 2022-08-25 DIAGNOSIS — Z7982 Long term (current) use of aspirin: Secondary | ICD-10-CM | POA: Diagnosis not present

## 2022-08-25 DIAGNOSIS — E785 Hyperlipidemia, unspecified: Secondary | ICD-10-CM | POA: Diagnosis not present

## 2022-08-25 DIAGNOSIS — I428 Other cardiomyopathies: Secondary | ICD-10-CM | POA: Diagnosis not present

## 2022-08-25 DIAGNOSIS — N183 Chronic kidney disease, stage 3 unspecified: Secondary | ICD-10-CM | POA: Insufficient documentation

## 2022-08-25 DIAGNOSIS — I509 Heart failure, unspecified: Secondary | ICD-10-CM

## 2022-08-25 DIAGNOSIS — I5042 Chronic combined systolic (congestive) and diastolic (congestive) heart failure: Secondary | ICD-10-CM

## 2022-08-25 DIAGNOSIS — I5022 Chronic systolic (congestive) heart failure: Secondary | ICD-10-CM | POA: Diagnosis present

## 2022-08-25 DIAGNOSIS — Z953 Presence of xenogenic heart valve: Secondary | ICD-10-CM | POA: Diagnosis not present

## 2022-08-25 LAB — BASIC METABOLIC PANEL
Anion gap: 10 (ref 5–15)
BUN: 13 mg/dL (ref 8–23)
CO2: 25 mmol/L (ref 22–32)
Calcium: 9.1 mg/dL (ref 8.9–10.3)
Chloride: 103 mmol/L (ref 98–111)
Creatinine, Ser: 0.95 mg/dL (ref 0.44–1.00)
GFR, Estimated: >60 mL/min (ref >60)
Glucose, Bld: 122 mg/dL — ABNORMAL HIGH (ref 70–99)
Potassium: 3.4 mmol/L — ABNORMAL LOW (ref 3.5–5.1)
Sodium: 138 mmol/L (ref 135–145)

## 2022-08-25 LAB — CBC
HCT: 40.8 % (ref 36.0–46.0)
Hemoglobin: 13.1 g/dL (ref 12.0–15.0)
MCH: 26.1 pg (ref 26.0–34.0)
MCHC: 32.1 g/dL (ref 30.0–36.0)
MCV: 81.3 fL (ref 80.0–100.0)
Platelets: 149 10*3/uL — ABNORMAL LOW (ref 150–400)
RBC: 5.02 MIL/uL (ref 3.87–5.11)
RDW: 15.6 % — ABNORMAL HIGH (ref 11.5–15.5)
WBC: 8.6 10*3/uL (ref 4.0–10.5)
nRBC: 0.2 % (ref 0.0–0.2)

## 2022-08-25 LAB — BRAIN NATRIURETIC PEPTIDE: B Natriuretic Peptide: 161.1 pg/mL — ABNORMAL HIGH (ref 0.0–100.0)

## 2022-08-25 MED ORDER — FUROSEMIDE 20 MG PO TABS: 20.0000 mg | ORAL_TABLET | Freq: Every day | ORAL | 11 refills | Status: DC

## 2022-08-25 MED ORDER — POTASSIUM CHLORIDE CRYS ER 20 MEQ PO TBCR
20.0000 meq | EXTENDED_RELEASE_TABLET | Freq: Every day | ORAL | 3 refills | Status: DC
Start: 2022-08-25 — End: 2023-05-05

## 2022-08-25 NOTE — Patient Instructions (Signed)
Labs done today. We will contact you only if your labs are abnormal.  RESTART Farxiga 10mg  (1 tablet) by mouth daily.   START Lasix 20mg  (1 tablet) by mouth daily.   No other medication changes were made. Please continue all current medications as prescribed.  You have been referred to the pharmacy clinic. They will contact you to schedule an appointment.   Your physician recommends that you schedule a follow-up appointment in: 4 weeks with our NP/PA Clinic here in our office.   If you have any questions or concerns before your next appointment please send [place] a message through [place] or call our office at [phone].    TO LEAVE A MESSAGE FOR THE NURSE SELECT OPTION 2, PLEASE LEAVE A MESSAGE INCLUDING: YOUR NAME DATE OF BIRTH CALL BACK NUMBER REASON FOR CALL**this is important as we prioritize the call backs  YOU WILL RECEIVE A CALL BACK THE SAME DAY AS LONG AS YOU CALL BEFORE 4:00 PM   Do the following things EVERYDAY: Weigh yourself in the morning before breakfast. Write it down and keep it in a log. Take your medicines as prescribed Eat low salt foods--Limit salt (sodium) to 2000 mg per day.  Stay as active as you can everyday Limit all fluids for the day to less than 2 liters   At the [facility], you and your health needs are our priority. As part of our continuing mission to provide you with exceptional heart care, we have created designated Provider Care Teams. These Care Teams include your primary Cardiologist (physician) and Advanced Practice Providers (APPs- Physician Assistants and Nurse Practitioners) who all work together to provide you with the care you need, when you need it.   You may see any of the following providers on your designated Care Team at your next follow up: Dr Glori Bickers Dr Haynes Kerns, NP Lyda Jester, [place] Audry Riles, PharmD   Please be sure to bring in all your medications bottles to every  appointment.

## 2022-08-25 NOTE — Telephone Encounter (Signed)
Patient called.  Patient aware.  

## 2022-08-25 NOTE — Progress Notes (Signed)
Advanced Heart Failure Clinic Note   PCP: Patient, No Pcp Per Cardiology: Dr. Ellyn Hack HF Cardiology: Dr. Aundra Dubin  62 y.o. 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  She had aortic and mitral repairs in 2010.  Afterwards, EF was mildly low in the 40-45% range.  However, EF had dropped to 25-30% on 1/18 echo.  TEE showed severe MR, moderate AI, moderate aortic stenosis.  RHC/LHC in 2/18 showed significantly elevated LV and RV filling pressures.    At her initial appointment here, she was very dyspneic with exertion, with orthopnea and PND. Her meds were adjusted and Lasix increased.  She has seen Dr. Roxy Manns since that time for evaluation for redo valve surgery.  She would require high risk mitral and aortic valve replacements. Plan was to follow closely w/ Dr. Roxy Manns for monitoring for surgery if further decline/ frequent CHF exacerbations. CPX was done in 3/18 showing moderate to severely decreased functional capacity.  RHC in 4/18 showed optimized filling pressures but low cardiac output.  Repeat echo in 8/18 showed EF 30-35%, severe AI with moderate AS but only mild MR noted.  The RV was mildly dilated with mildly decreased systolic function.   TEE 06/28/2017: EF 45%, severe AI, mild to moderate AS, mild mitral valve stenosis s/p MV repair  In 7/19, she had TAVR with #23 Edwards Sapien 3 THV.  Echo post-op in 7/19 showed EF 30-35%, moderate LVH, bioprosthetic aortic valve looked ok, s/p MV repair with mild mitral stenosis.   She developed pain in her right leg and was found to have occluded right EIA on dopplers in 9/19, confirmed by CTA. Suspect this was a vascular access complication of TAVR.  She was seen by Dr. Donnetta Hutching who recommended conservative treatment for the time being.  Echo in 2/20 showed that EF remains 30-35%, stable TAVR valve, s/p MVR repair with no regurgitation or significant stenosis.   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 There was concern for possible partial valve thrombosis and gated CT ordered to assess aortic valve.  Echo in 6/23 showed EF 30-35% with wall motion abnormalities, moderate RV dysfunction, s/p mitral valve repair with trivial MR and mean gradient 4 mmHg, bioprosthetic aortic valve s/p TAVR with mean gradient 49 mmHg and AVA 0.67 cm^2. 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 Patient was started on Eliquis.   Repeat gated CTA chest on apixaban in 11/23 showed minimal restriction to bioprosthetic aortic valve excursion with no significant thrombus, significant improvement.    Most recent echo was 1/24, showing EF up to 40-45% with apical hypokinesis, mild RV dysfunction, mild mitral stenosis s/p MV repair with mean gradient 6 mmHg, bioprosthetic aortic valve with mean gradient 13 mmHg (improved), normal IVC.   At last OV 1/25, Wilder Glade was started and she was instructed to switch lasix to PRN. However she later re-developed fluid retention, orthopnea, chest tightness and LEE off lasix. She thought was related to [place] and stopped. She called the office and instructed to restart daily lasix.  Patient returns for followup of CHF.  After restarting  lasix, she noted improvement in symptoms but ran out of lasix a few days ago. Needs refills. Still has Farxiga at home and willing to restart. BP 110/80. Denies syncope/ near syncope.    Labs (2/18): K 3.8 => 3.4, creatinine 1.09 => 1.07, hgb 12.7, BNP 1010 Labs (3/18): K 4.6, creatinine 2.43 => 1.74 Labs (4/18): K 4, creatinine 1.28, digoxin 0.3 Labs 09/2016: K 4.4,  creatinine 1.26.  Labs 01/19/2017: K 4.4, Creatinine 1.21, digoxin 0.3 Labs (10/18): K 4.7, creatinine 1.10, digoxin 0.3 Labs (12/18): K 4.9, creatinine 1.34, digoxin < 0.2 Labs (8/19): K 3.9, creatinine 1.33 Labs (9/19): K 3.7, creatinine 1.46 Labs (7/21): K 4.9, creatinine 1.38 Labs (8/22): K 3.8, creatinine 1.65, LDL 174 Labs (11/22): K 4.3, creatinine 1.21, LDL 30, LFTs ok Labs (2/23): K 4.3, creatinine 1.41 Labs (5/23): K 4.1, creatinine 1.49, BNP 315 Labs (7/23): LDL 45 Labs (11/23): K 4.3, creatinine 1.06 Labs (1/24): K 4.2, creatinine 1.3   PMH: 1. HTN 2. Hypothyroidism 3. Hyperlipidemia 4. Rheumatic heart disease: s/p MV repair and aortic valve repair in 2010, Dr. Roxy Manns. - TEE (2/18): EF 35-30%, dilated LV, rheumatic-appearing aortic valve s/p repair with moderate AS (AVA 1.1 cm^2), moderate AI.  MV s/p repair with mild-moderate mitral stenosis and eccentric severe MR.   - Echo (8/18): Severe AI but only moderate AS, MR was only mild on this study (may have missed full jet) with mean MV gradient 4 mmHg.  - TEE 06/28/2017: EF 45%, severe AI, mild to moderate AS, mild mitral valve stenosis s/p MV repair - In 7/19, she had TAVR with #23 Edwards Sapien 3 THV.   - Echo post-op in 7/19 showed EF 30-35%, moderate LVH, bioprosthetic aortic valve looked ok, s/p MV repair with mild mitral stenosis.  - Echo in 11/22 showed TAVR valve with significant stenosis and regurgitation => mild-moderate PVL with mean gradient 29 mmHg and DI 0.22.  There was mild stenosis of the repaired mitral valve.  - Gated CTA chest (6/23) showed HALT/HAM involving all 3 leaflets of the bioprosthetic aortic valve with restriction to motion - Gated CTA chest (11/23, on apixaban): minimal restriction to bioprosthetic aortic valve excursion with no significant thrombus, significant improvement.   5. Chronic systolic CHF: Nonischemic cardiomyopathy, probably related to valvular disease.   - Echo (11/15): EF 40-45%, -  Echo (12/16): EF 35-40%. - Echo (1/18): EF 25-30%.  - LHC/RHC (2/18): Normal coronaries, mean RA 18, mean PA 74/39 mean 52, mean PCWP 31, CI 1.98 Fick. - CPX (3/18): peak VO2 11.7, VE/VCO2 slope 40, RER 1.11 => moderate to severely decreased functional capacity due to HF.  - RHC (4/18): mean RA 4, PA 44/17 mean 28, mean PCWP 15, CI 1.73, PVR 4 WU.  - Echo (8/18): EF 30-35%, severe AI with moderate AS but only mild MR noted with mean MV gradient 4 mmHg.  The RV was mildly dilated with mildly decreased systolic function. - TEE [id number]: EF 45%, severe aortic insufficiency, mild to moderate AS, mild mitral valve stenosis s/p MV repair - Coronary CTA (5/19): No obstructive coronary disease.  - Echo (7/19): EF 30-35%, moderate LVH, bioprosthetic aortic valve looked ok, s/p MV repair with mild mitral stenosis.  - Echo (2/20): EF 30-35%, moderate LVH with diffuse hypokinesis, mildly decreased RV systolic function, bioprosthetic aortic valve s/p TAVR without significant stenosis or regurgitation, s/p MV repair with mean gradient 3 mmHg and no MR.  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  - Echo (6/23):  EF 30-35% with wall motion abnormalities, moderate RV dysfunction, s/p mitral valve repair with trivial MR and mean gradient 4 mmHg, bioprosthetic aortic valve s/p TAVR with mean gradient 49 mmHg and AVA 0.67 cm^2.  - Echo (1/24): EF 40-45% with apical hypokinesis, mild RV dysfunction, mild mitral stenosis s/p MV repair with mean gradient 6 mmHg, bioprosthetic aortic valve with mean gradient 13 mmHg (improved), normal IVC.  6. Carotid dopplers (6/19): No significant disease.  7. PAD: 9/19 peripheral arterial dopplers showed total occluded proximal right external iliac artery.  - CTA confirmed total  occlusion of R EIA with collaterals.  Suspect TAVR vascular access complication.  - Peripheral arterial dopplers (6/23): totally occluded right mid to distal R CFA.  Social History   Socioeconomic History   Marital status: Single    Spouse name: Not on file   Number of children: Not on file   Years of education: Not on file   Highest education level: Not on file  Occupational History   Not on file  Tobacco Use   Smoking status: Former    Packs/day: 0.10    Years: 15.00    Additional pack years: 0.00    Total pack years: 1.50    Types: Cigarettes    Quit date: 2011    Years since quitting: 13.2   Smokeless tobacco: Never  Vaping Use   Vaping Use: Never used  Substance and Sexual Activity   Alcohol use: Never   Drug use: Never   Sexual activity: Not Currently  Other Topics Concern   Not on file  Social History Narrative   Unemployed, on disability.   Now is finally reestablished with a PCP.   She is a former smoker, quit in 2011. Denies alcohol consumption   Tries to walk routinely.   Social Determinants of Health   Financial Resource Strain: Not on file  Food Insecurity: Not on file  Transportation Needs: Unmet Transportation Needs (06/01/2022)   PRAPARE - Hydrologist (Medical): Yes    Lack of Transportation (Non-Medical): Yes  Physical Activity: Not on file  Stress: Not on file  Social Connections: Not on file  Intimate Partner Violence: Not on file   Family History  Problem Relation Age of Onset   Heart disease Mother    CVA Mother    Review of systems complete and found to be negative unless listed in HPI.    Current Outpatient Medications  Medication Sig Dispense Refill   acetaminophen (TYLENOL) 325 MG tablet Take 650 mg every 6 (six) hours as needed by mouth for mild pain.     albuterol (PROVENTIL HFA;VENTOLIN HFA) 108 (90 BASE) MCG/ACT inhaler Inhale 2 puffs into the lungs every 6 (six) hours as needed for shortness of  breath.      apixaban (ELIQUIS) 5 MG TABS tablet Take 1 tablet (5 mg total) by mouth 2 (two) times daily. 60 tablet 11   aspirin 81 MG chewable tablet Chew 1 tablet (81 mg total) by mouth daily. 30 tablet 3   beclomethasone (QVAR) 40 MCG/ACT inhaler Inhale 2 puffs into the lungs daily as needed (shortness of breath).     carvedilol (COREG) 12.5 MG tablet Take 1 tablet (12.5 mg total) by mouth 2 (two) times daily with a meal. 180 tablet 3  dapagliflozin propanediol (FARXIGA) 10 MG TABS tablet Take 1 tablet (10 mg total) by mouth daily before breakfast. 90 tablet 3   levothyroxine (SYNTHROID) 125 MCG tablet Take 125 mcg by mouth daily before breakfast. Except sunday     rosuvastatin (CRESTOR) 20 MG tablet TAKE 1 TABLET(20 MG) BY MOUTH DAILY 30 tablet 3   sacubitril-valsartan (ENTRESTO) 49-51 MG Take 1 tablet by mouth 2 (two) times daily. 60 tablet 11   spironolactone (ALDACTONE) 25 MG tablet TAKE 1 TABLET(25 MG) BY MOUTH DAILY 30 tablet 5   No current facility-administered medications for this visit.   There were no vitals taken for this visit.  Wt Readings from Last 3 Encounters:  05/25/22 87 kg (191 lb 12.8 oz)  12/16/21 88 kg (194 lb)  11/26/21 87.1 kg (192 lb)   PHYSICAL EXAM: General:  Well appearing,obese. No respiratory difficulty HEENT: no teeth, s/p extractions. Otherwise normal Neck: supple. no JVD. Carotids 2+ bilat; no bruits. No lymphadenopathy or thyromegaly appreciated. Cor: PMI nondisplaced. Regular rate & rhythm. No rubs, gallops or murmurs. Lungs: clear Abdomen: obese, soft, nontender, nondistended. No hepatosplenomegaly. No bruits or masses. Good bowel sounds. Extremities: no cyanosis, clubbing, rash, edema Neuro: alert & oriented x 3, cranial nerves grossly intact. moves all 4 extremities w/o difficulty. Affect pleasant.   Assessment/Plan: 1. 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  She had aortic and mitral valve repairs in 2010.   It was felt that she would  not be a very good Mitraclip candidate as she already has a degree of mitral stenosis.  TEE in 2/18 showed moderate AS/moderate AI , mild to moderate MS, severe eccentric MR.  Echo in 8/18 showed moderate AS/severe AI but only mild mitral stenosis and mild MR noted. Hemodynamics looked better on 4/18 RHC though cardiac output still marginal.  Repeat TEE 06/28/2017: EF 45%, severe aortic insufficiency, mild to moderate AS, mild mitral valve stenosis s/p MV repair with trivial MR. She had TAVR in 7/19, feeling much better since then.  Echo 6/21 showed stable bioprosthetic aortic valve, s/p mitral valve repair without significant stenosis or regurgitation. Echo 11/22,? partial-valve thrombosis, elevated mean gradient 29 mm Hg, previously 22 mm HG 6/21. Echo in 6/23 showed mean gradient up to 49 mmHg with AVA 0.67 cm^2.  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  Eliquis was started. Repeat gated CTA chest in 11/23 showed no significant restriction to bioprosthetic aortic valve excursion with no significant thrombus => marked improvement.  Echo 1/24 showed mean gradient down to 13 mmHg across the aortic valve.  - She will need to continue Eliquis 5 mg bid long-term.  - Continue ASA 81 daily.  2. Chronic systolic CHF:  Nonischemic cardiomyopathy (no significant disease on 2/18 LHC).  It is possible that this cardiomyopathy is driven by her valvular disease. Echo (8/18) with EF 30-35% RV mildly dilated. TEE 06/28/2017: EF 45%, severe aortic insufficiency, mild to moderate AS, mild mitral valve stenosis s/p MV repair with only trivial MR. Post-TAVR echo showed EF down to 30-35%, and echo in 2/20 showed EF stable in 30-35% range.  This may reflect myocardial dysfunction from prior long-standing aortic insufficiency.  Echo 6/21 showed EF 40-45%. Echo (11/22): EF 35-40%. Echo in 6/23 showed EF  30-35%.  Echo 1/24 was improved with EF 40-45% with apical hypokinesis, mild RV dysfunction, mild mitral stenosis s/p MV repair with mean gradient  6 mmHg, bioprosthetic aortic valve with mean gradient 13 mmHg (improved), normal IVC.  NYHA II-early III, confounded by obesity. + symptoms of orthopnea/chest tightness shortly after starting Farxiga and switching lasix to PRN. I don't think this was a reaction to [place] and instead volume overload w/ reduction in diuretics.  - Restart Farxiga 10 mg  - Restart Lasix 20 mg daily  - Continue Entresto 49/51 mg bid. - Continue Farxiga 10 mg daily.  - Continue spironolactone 25 mg daily.  - Continue Coreg 12.5 mg bid.  - discussed sodium and fluid restriction  3. CKD stage III: baseline SCr ~1.3 - check BMP today  4. PAD: Occluded right EIA likely due to TAVR vascular access complication.  Occluded right CFA on last doppler study. Has seen [facility], plan for medical management.  Denies claudication    - Followup with [facility].   5. Hyperlipidemia: on Crestor  - check LP today  6. Obesity: Body mass index is 41.81 kg/m.  She is interested in semaglutide - refer to PharmD   Followup in wks with APP.     Lyda Jester, PA-C 08/25/2022

## 2022-08-25 NOTE — Telephone Encounter (Signed)
-----   Message from Consuelo Pandy, [place] sent at 08/25/2022  4:53 PM EDT ----- K low and we added back diuretics today. Start KCl 20 mEq daily. Repeat BMP in 7-10 days

## 2022-09-01 ENCOUNTER — Other Ambulatory Visit (HOSPITAL_COMMUNITY): Payer: Self-pay

## 2022-09-02 ENCOUNTER — Ambulatory Visit (HOSPITAL_COMMUNITY)
Admission: RE | Admit: 2022-09-02 | Discharge: 2022-09-02 | Disposition: A | Discharge status: Home / Self Care | Payer: Medicaid Other | Source: Ambulatory Visit | Attending: Cardiology | Admitting: Cardiology

## 2022-09-02 DIAGNOSIS — I272 Pulmonary hypertension, unspecified: Secondary | ICD-10-CM | POA: Diagnosis not present

## 2022-09-02 LAB — BASIC METABOLIC PANEL
Anion gap: 8 (ref 5–15)
BUN: 25 mg/dL — ABNORMAL HIGH (ref 8–23)
CO2: 23 mmol/L (ref 22–32)
Calcium: 9.2 mg/dL (ref 8.9–10.3)
Chloride: 104 mmol/L (ref 98–111)
Creatinine, Ser: 1.17 mg/dL — ABNORMAL HIGH (ref 0.44–1.00)
GFR, Estimated: 53 mL/min — ABNORMAL LOW (ref >60)
Glucose, Bld: 110 mg/dL — ABNORMAL HIGH (ref 70–99)
Potassium: 4.2 mmol/L (ref 3.5–5.1)
Sodium: 135 mmol/L (ref 135–145)

## 2022-09-15 ENCOUNTER — Other Ambulatory Visit (HOSPITAL_COMMUNITY): Payer: Self-pay

## 2022-09-15 DIAGNOSIS — I509 Heart failure, unspecified: Secondary | ICD-10-CM

## 2022-09-15 MED ORDER — ROSUVASTATIN CALCIUM 20 MG PO TABS
ORAL_TABLET | ORAL | 11 refills | Status: DC
Start: 2022-09-15 — End: 2023-06-17

## 2022-09-19 ENCOUNTER — Telehealth (HOSPITAL_COMMUNITY): Payer: Self-pay

## 2022-09-19 NOTE — Telephone Encounter (Signed)
Received Cardiac Clearance form from [facility] requesting patient be cleared for the following procedure endoscopy and or colonoscopy on 10/12/22. They request that the patient hold the Eliquis for 2 days.Form was placed in Prince Rome, NP folder on 29/Apr/2024 for signature. Will update chart once clearance has been reviewed and signed by provider.

## 2022-09-21 NOTE — Progress Notes (Signed)
[facility] Note   PCP: Patient, No Pcp Per Cardiology: Dr. Herbie Baltimore HF Cardiology: Dr. Shirlee Latch  63 y.o. 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  She had aortic and mitral repairs in 2010.  Afterwards, EF was mildly low in the 40-45% range.  However, EF had dropped to 25-30% on 1/18 echo.  TEE showed severe MR, moderate AI, moderate aortic stenosis.  RHC/LHC in 2/18 showed significantly elevated LV and RV filling pressures.    At her initial appointment here, she was very dyspneic with exertion, with orthopnea and PND. Her meds were adjusted and Lasix increased.  She has seen Dr. Cornelius Moras since that time for evaluation for redo valve surgery.  She would require high risk mitral and aortic valve replacements. Plan was to follow closely w/ Dr. Cornelius Moras for monitoring for surgery if further decline/ frequent CHF exacerbations. CPX was done in 3/18 showing moderate to severely decreased functional capacity.  RHC in 4/18 showed optimized filling pressures but low cardiac output.  Repeat echo in 8/18 showed EF 30-35%, severe AI with moderate AS but only mild MR noted.  The RV was mildly dilated with mildly decreased systolic function.   TEE 06/28/2017: EF 45%, severe AI, mild to moderate AS, mild mitral valve stenosis s/p MV repair  In 7/19, she had TAVR with #23 Edwards Sapien 3 THV.  Echo post-op in 7/19 showed EF 30-35%, moderate LVH, bioprosthetic aortic valve looked ok, s/p MV repair with mild mitral stenosis.   She developed pain in her right leg and was found to have occluded right EIA on dopplers in 9/19, confirmed by CTA. Suspect this was a vascular access complication of TAVR.  She was seen by Dr. Arbie Cookey who recommended conservative treatment for the time being.  Echo in 2/20 showed that EF remains 30-35%, stable TAVR valve, s/p MVR repair with no regurgitation or significant stenosis.   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 There was concern for possible partial valve thrombosis and gated CT ordered to assess aortic valve.  Echo in 6/23 showed EF 30-35% with wall motion abnormalities, moderate RV dysfunction, s/p mitral valve repair with trivial MR and mean gradient 4 mmHg, bioprosthetic aortic valve s/p TAVR with mean gradient 49 mmHg and AVA 0.67 cm^2. 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 Patient was started on Eliquis.   Repeat gated CTA chest on apixaban in 11/23 showed minimal restriction to bioprosthetic aortic valve excursion with no significant thrombus, significant improvement.    Most recent echo was 1/24, showing EF up to 40-45% with apical hypokinesis, mild RV dysfunction, mild mitral stenosis s/p MV repair with mean gradient 6 mmHg, bioprosthetic aortic valve with mean gradient 13 mmHg (improved), normal IVC.   At last OV 1/25, Marcelline Deist was started and she was instructed to switch lasix to PRN. However she later re-developed fluid retention, orthopnea, chest tightness and LEE off lasix. She thought was related to [place] and stopped. She called the office and instructed to restart daily lasix.   Today she returns for HF follow up.Overall feeling  fine. Denies SOB/PND/Orthopnea. Appetite ok. No fever or chills. Weight at home  pounds. Taking all medications   Labs (2/18): K 3.8 => 3.4, creatinine 1.09 => 1.07, hgb 12.7, BNP 1010 Labs (3/18): K 4.6, creatinine 2.43 => 1.74 Labs (4/18): K 4, creatinine 1.28, digoxin 0.3 Labs 09/2016: K 4.4, creatinine 1.26.  Labs 01/19/2017: K 4.4, Creatinine 1.21, digoxin 0.3 Labs  (10/18): K 4.7, creatinine 1.10, digoxin 0.3 Labs (12/18): K 4.9, creatinine 1.34, digoxin < 0.2 Labs (8/19): K 3.9, creatinine 1.33 Labs (9/19): K 3.7, creatinine 1.46 Labs (7/21): K 4.9, creatinine 1.38 Labs (8/22): K 3.8, creatinine 1.65, LDL 174 Labs (11/22): K 4.3, creatinine 1.21, LDL 30, LFTs ok Labs (2/23): K 4.3, creatinine 1.41 Labs (5/23): K 4.1, creatinine 1.49, BNP 315 Labs (7/23): LDL 45 Labs (11/23): K 4.3, creatinine 1.06 Labs (1/24): K 4.2, creatinine 1.3   PMH: 1. HTN 2. Hypothyroidism 3. Hyperlipidemia 4. Rheumatic heart disease: s/p MV repair and aortic valve repair in 2010, Dr. Cornelius Moras. - TEE (2/18): EF 35-30%, dilated LV, rheumatic-appearing aortic valve s/p repair with moderate AS (AVA 1.1 cm^2), moderate AI.  MV s/p repair with mild-moderate mitral stenosis and eccentric severe MR.   - Echo (8/18): Severe AI but only moderate AS, MR was only mild on this study (may have missed full jet) with mean MV gradient 4 mmHg.  - TEE 06/28/2017: EF 45%, severe AI, mild to moderate AS, mild mitral valve stenosis s/p MV repair - In 7/19, she had TAVR with #23 Edwards Sapien 3 THV.   - Echo post-op in 7/19 showed EF 30-35%, moderate LVH, bioprosthetic aortic valve looked ok, s/p MV repair with mild mitral stenosis.  - Echo in 11/22 showed TAVR valve with significant stenosis and regurgitation => mild-moderate PVL with mean gradient 29 mmHg and DI 0.22.  There was mild stenosis of the repaired mitral valve.  - Gated CTA chest (6/23) showed HALT/HAM involving all 3 leaflets of the bioprosthetic aortic valve with restriction to motion - Gated CTA chest (11/23, on apixaban): minimal restriction to bioprosthetic aortic valve excursion with no significant thrombus, significant improvement.   5. Chronic systolic CHF: Nonischemic cardiomyopathy, probably related to valvular disease.   - Echo (11/15): EF 40-45%, - Echo (12/16): EF 35-40%. - Echo (1/18): EF 25-30%.  - LHC/RHC (2/18): Normal  coronaries, mean RA 18, mean PA 74/39 mean 52, mean PCWP 31, CI 1.98 Fick. - CPX (3/18): peak VO2 11.7, VE/VCO2 slope 40, RER 1.11 => moderate to severely decreased functional capacity due to HF.  - RHC (4/18): mean RA 4, PA 44/17 mean 28, mean PCWP 15, CI 1.73, PVR 4 WU.  - Echo (8/18): EF 30-35%, severe AI with moderate AS but only mild MR noted with mean MV gradient 4 mmHg.  The RV was mildly dilated with mildly decreased systolic function. - TEE 2/19: EF 45%, severe aortic insufficiency, mild to moderate AS, mild mitral valve stenosis s/p MV repair - Coronary CTA (5/19): No obstructive coronary disease.  - Echo (7/19): EF 30-35%, moderate LVH, bioprosthetic aortic valve looked ok, s/p MV repair with mild mitral stenosis.  - Echo (2/20): EF 30-35%, moderate LVH with diffuse hypokinesis, mildly decreased RV systolic function, bioprosthetic aortic valve s/p TAVR without significant stenosis or regurgitation, s/p MV repair with mean gradient 3 mmHg and no MR.  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  - Echo (6/23):  EF 30-35% with wall motion abnormalities, moderate RV dysfunction, s/p mitral valve repair with trivial MR and mean gradient 4 mmHg, bioprosthetic aortic valve s/p TAVR with mean gradient 49 mmHg and AVA 0.67 cm^2.  - Echo (1/24): EF 40-45% with apical hypokinesis, mild RV dysfunction, mild mitral stenosis s/p MV repair with mean gradient 6 mmHg, bioprosthetic aortic valve with mean gradient 13 mmHg (improved), normal IVC.  6. Carotid dopplers (6/19): No significant disease.  7. PAD: 9/19 peripheral arterial dopplers showed total occluded proximal right external iliac artery.  - CTA confirmed total occlusion of R EIA with collaterals.  Suspect TAVR vascular access complication.  -  Peripheral arterial dopplers (6/23): totally occluded right mid to distal R CFA.  Social History   Socioeconomic History   Marital status: Single    Spouse name: Not on file   Number of children: Not on file   Years of education: Not on file   Highest education level: Not on file  Occupational History   Not on file  Tobacco Use   Smoking status: Former    Packs/day: 0.10    Years: 15.00    Additional pack years: 0.00    Total pack years: 1.50    Types: Cigarettes    Quit date: 2011    Years since quitting: 13.3   Smokeless tobacco: Never  Vaping Use   Vaping Use: Never used  Substance and Sexual Activity   Alcohol use: Never   Drug use: Never   Sexual activity: Not Currently  Other Topics Concern   Not on file  Social History Narrative   Unemployed, on disability.   Now is finally reestablished with a PCP.   She is a former smoker, quit in 2011. Denies alcohol consumption   Tries to walk routinely.   Social Determinants of Health   Financial Resource Strain: Not on file  Food Insecurity: Not on file  Transportation Needs: Unmet Transportation Needs (06/01/2022)   PRAPARE - Administrator, Civil Service (Medical): Yes    Lack of Transportation (Non-Medical): Yes  Physical Activity: Not on file  Stress: Not on file  Social Connections: Not on file  Intimate Partner Violence: Not on file   Family History  Problem Relation Age of Onset   Heart disease Mother    CVA Mother    Review of systems complete and found to be negative unless listed in HPI.    Current Outpatient Medications  Medication Sig Dispense Refill   acetaminophen (TYLENOL) 325 MG tablet Take 650 mg every 6 (six) hours as needed by mouth for mild pain.     albuterol (PROVENTIL HFA;VENTOLIN HFA) 108 (90 BASE) MCG/ACT inhaler Inhale 2 puffs into the lungs every 6 (six) hours as needed for shortness of breath.      apixaban (ELIQUIS) 5 MG TABS tablet Take 1 tablet (5 mg total) by mouth 2  (two) times daily. 60 tablet 11   aspirin 81 MG chewable tablet Chew 1 tablet (81 mg total) by mouth daily. 30 tablet 3   beclomethasone (QVAR) 40 MCG/ACT inhaler Inhale 2 puffs into the lungs daily as needed (shortness of breath).     carvedilol (COREG) 12.5 MG tablet Take 1 tablet (12.5 mg total) by mouth 2 (two) times daily with a meal. 180 tablet 3   dapagliflozin propanediol (FARXIGA) 10 MG TABS tablet Take 1 tablet (10 mg total) by  mouth daily before breakfast. 90 tablet 3   furosemide (LASIX) 20 MG tablet Take 1 tablet (20 mg total) by mouth daily. 30 tablet 11   levothyroxine (SYNTHROID) 125 MCG tablet Take 125 mcg by mouth daily before breakfast. Except sunday     potassium chloride SA (KLOR-CON M) 20 MEQ tablet Take 1 tablet (20 mEq total) by mouth daily. 90 tablet 3   rosuvastatin (CRESTOR) 20 MG tablet TAKE 1 TABLET(20 MG) BY MOUTH DAILY 30 tablet 11   sacubitril-valsartan (ENTRESTO) 49-51 MG Take 1 tablet by mouth 2 (two) times daily. 60 tablet 11   spironolactone (ALDACTONE) 25 MG tablet TAKE 1 TABLET(25 MG) BY MOUTH DAILY 30 tablet 5   No current facility-administered medications for this visit.   There were no vitals taken for this visit.  Wt Readings from Last 3 Encounters:  08/25/22 87.6 kg (193 lb 3.2 oz)  05/25/22 87 kg (191 lb 12.8 oz)  12/16/21 88 kg (194 lb)   PHYSICAL EXAM: General:  Well appearing,obese. No respiratory difficulty HEENT: no teeth, s/p extractions. Otherwise normal Neck: supple. no JVD. Carotids 2+ bilat; no bruits. No lymphadenopathy or thyromegaly appreciated. Cor: PMI nondisplaced. Regular rate & rhythm. No rubs, gallops or murmurs. Lungs: clear Abdomen: obese, soft, nontender, nondistended. No hepatosplenomegaly. No bruits or masses. Good bowel sounds. Extremities: no cyanosis, clubbing, rash, edema Neuro: alert & oriented x 3, cranial nerves grossly intact. moves all 4 extremities w/o difficulty. Affect pleasant.   Assessment/Plan: 1.  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  She had aortic and mitral valve repairs in 2010.   It was felt that she would not be a very good Mitraclip candidate as she already has a degree of mitral stenosis.  TEE in 2/18 showed moderate AS/moderate AI , mild to moderate MS, severe eccentric MR.  Echo in 8/18 showed moderate AS/severe AI but only mild mitral stenosis and mild MR noted. Hemodynamics looked better on 4/18 RHC though cardiac output still marginal.  Repeat TEE 06/28/2017: EF 45%, severe aortic insufficiency, mild to moderate AS, mild mitral valve stenosis s/p MV repair with trivial MR. She had TAVR in 7/19, feeling much better since then.  Echo 6/21 showed stable bioprosthetic aortic valve, s/p mitral valve repair without significant stenosis or regurgitation. Echo 11/22,? partial-valve thrombosis, elevated mean gradient 29 mm Hg, previously 22 mm HG 6/21. Echo in 6/23 showed mean gradient up to 49 mmHg with AVA 0.67 cm^2.  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  Eliquis was started. Repeat gated CTA chest in 11/23 showed no significant restriction to bioprosthetic aortic valve excursion with no significant thrombus => marked improvement.  Echo 1/24 showed mean gradient down to 13 mmHg across the aortic valve.  - She will need to continue Eliquis 5 mg bid long-term.  - Continue ASA 81 daily.  2. Chronic systolic CHF:  Nonischemic cardiomyopathy (no significant disease on 2/18 LHC).  It is possible that this cardiomyopathy is driven by her valvular disease. Echo (8/18) with EF 30-35% RV mildly dilated. TEE 06/28/2017: EF 45%, severe aortic insufficiency, mild to moderate AS, mild mitral valve stenosis s/p MV repair with only trivial MR. Post-TAVR echo showed EF down to 30-35%, and echo in 2/20 showed EF stable in 30-35% range.  This may reflect myocardial dysfunction  from prior long-standing aortic insufficiency.  Echo 6/21 showed EF 40-45%. Echo (11/22): EF 35-40%.  Echo in 6/23 showed EF 30-35%.  Echo 1/24 was improved with EF 40-45% with apical hypokinesis, mild RV dysfunction, mild mitral stenosis s/p MV repair with mean gradient 6 mmHg, bioprosthetic aortic valve with mean gradient 13 mmHg (improved), normal IVC.  NYHA  - Continue Farxiga 10 mg  - Continue Lasix 20 mg daily  - Continue Entresto 49/51 mg bid. - Continue Farxiga 10 mg daily.  - Continue spironolactone 25 mg daily.  - Continue Coreg 12.5 mg bid.    4. PAD: Occluded right EIA likely due to TAVR vascular access complication.  Occluded right CFA on last doppler study. Has seen [facility], plan for medical management.  Denies claudication    - Followup with [facility].   5. Hyperlipidemia: on Crestor  - check LP today  6. Obesity: There is no height or weight on file to calculate BMI.  She is interested in semaglutide - refer to PharmD   Follow up   Tonye Becket, NP-C  09/21/2022

## 2022-09-22 ENCOUNTER — Ambulatory Visit (HOSPITAL_COMMUNITY)
Admission: RE | Admit: 2022-09-22 | Discharge: 2022-09-22 | Disposition: A | Discharge status: Home / Self Care | Payer: Medicaid Other | Source: Ambulatory Visit | Attending: Adult Health | Admitting: Adult Health

## 2022-09-22 ENCOUNTER — Encounter (HOSPITAL_COMMUNITY): Payer: Self-pay

## 2022-09-22 VITALS — BP 90/72 | HR 67 | Wt 192.6 lb

## 2022-09-22 DIAGNOSIS — Z6841 Body Mass Index (BMI) 40.0 and over, adult: Secondary | ICD-10-CM | POA: Diagnosis not present

## 2022-09-22 DIAGNOSIS — Z953 Presence of xenogenic heart valve: Secondary | ICD-10-CM | POA: Insufficient documentation

## 2022-09-22 DIAGNOSIS — Z7901 Long term (current) use of anticoagulants: Secondary | ICD-10-CM | POA: Insufficient documentation

## 2022-09-22 DIAGNOSIS — I5022 Chronic systolic (congestive) heart failure: Secondary | ICD-10-CM | POA: Insufficient documentation

## 2022-09-22 DIAGNOSIS — I428 Other cardiomyopathies: Secondary | ICD-10-CM | POA: Diagnosis not present

## 2022-09-22 DIAGNOSIS — I052 Rheumatic mitral stenosis with insufficiency: Secondary | ICD-10-CM | POA: Diagnosis not present

## 2022-09-22 DIAGNOSIS — I110 Hypertensive heart disease with heart failure: Secondary | ICD-10-CM | POA: Diagnosis not present

## 2022-09-22 DIAGNOSIS — Z7982 Long term (current) use of aspirin: Secondary | ICD-10-CM | POA: Insufficient documentation

## 2022-09-22 DIAGNOSIS — E669 Obesity, unspecified: Secondary | ICD-10-CM | POA: Insufficient documentation

## 2022-09-22 DIAGNOSIS — Z79899 Other long term (current) drug therapy: Secondary | ICD-10-CM | POA: Insufficient documentation

## 2022-09-22 DIAGNOSIS — E785 Hyperlipidemia, unspecified: Secondary | ICD-10-CM | POA: Diagnosis not present

## 2022-09-22 DIAGNOSIS — Z7984 Long term (current) use of oral hypoglycemic drugs: Secondary | ICD-10-CM | POA: Insufficient documentation

## 2022-09-22 NOTE — Patient Instructions (Addendum)
Thank you for coming in today  Labs were done today, if any labs are abnormal the clinic will call you No news is good news  Medications: No changes  Follow up appointments:  Your physician recommends that you schedule a follow-up appointment in:  3-4 months with With Dr. Shirlee Latch     Do the following things EVERYDAY: Weigh yourself in the morning before breakfast. Write it down and keep it in a log. Take your medicines as prescribed Eat low salt foods--Limit salt (sodium) to 2000 mg per day.  Stay as active as you can everyday Limit all fluids for the day to less than 2 liters   At the [facility], you and your health needs are our priority. As part of our continuing mission to provide you with exceptional heart care, we have created designated Provider Care Teams. These Care Teams include your primary Cardiologist (physician) and Advanced Practice Providers (APPs- Physician Assistants and Nurse Practitioners) who all work together to provide you with the care you need, when you need it.   You may see any of the following providers on your designated Care Team at your next follow up: Dr Arvilla Meres Dr Marca Ancona Dr. Marcos Eke, NP Robbie Lis, [place] [facility] [place], [place] Brynda Peon, NP Karle Plumber, PharmD   Please be sure to bring in all your medications bottles to every appointment.    Thank you for choosing [facility]  If you have any questions or concerns before your next appointment please send [place] a message through [place] or call our office at [phone].    TO LEAVE A MESSAGE FOR THE NURSE SELECT OPTION 2, PLEASE LEAVE A MESSAGE INCLUDING: YOUR NAME DATE OF BIRTH CALL BACK NUMBER REASON FOR CALL**this is important as we prioritize the call backs  YOU WILL RECEIVE A CALL BACK THE SAME DAY AS LONG AS YOU CALL BEFORE 4:00 PM

## 2022-09-26 NOTE — Telephone Encounter (Signed)
Medical clearance form was signed by Prince Rome, NP and successfully faxed to [phone] on . Form will be scanned into patients chart.  Monday, May 6,

## 2022-12-02 ENCOUNTER — Other Ambulatory Visit: Payer: Self-pay | Admitting: *Deleted

## 2022-12-02 DIAGNOSIS — I739 Peripheral vascular disease, unspecified: Secondary | ICD-10-CM

## 2022-12-14 ENCOUNTER — Other Ambulatory Visit (HOSPITAL_COMMUNITY): Payer: Self-pay

## 2022-12-14 MED ORDER — APIXABAN 5 MG PO TABS: 5.0000 mg | ORAL_TABLET | Freq: Two times a day (BID) | ORAL | 11 refills | Status: DC

## 2022-12-16 ENCOUNTER — Other Ambulatory Visit (HOSPITAL_COMMUNITY): Payer: Self-pay

## 2022-12-16 MED ORDER — SPIRONOLACTONE 25 MG PO TABS: ORAL_TABLET | ORAL | 11 refills | Status: DC

## 2022-12-22 ENCOUNTER — Ambulatory Visit (HOSPITAL_COMMUNITY)
Admission: RE | Admit: 2022-12-22 | Discharge: 2022-12-22 | Disposition: A | Discharge status: Home / Self Care | Payer: Medicaid Other | Source: Ambulatory Visit | Attending: Vascular Surgery | Admitting: Vascular Surgery

## 2022-12-22 ENCOUNTER — Ambulatory Visit (INDEPENDENT_AMBULATORY_CARE_PROVIDER_SITE_OTHER): Payer: Medicaid Other | Admitting: Vascular Surgery

## 2022-12-22 ENCOUNTER — Encounter: Payer: Self-pay | Admitting: Vascular Surgery

## 2022-12-22 VITALS — BP 100/66 | HR 61 | Temp 98.1°F | Resp 20 | Ht <= 58 in | Wt 192.0 lb

## 2022-12-22 DIAGNOSIS — I739 Peripheral vascular disease, unspecified: Secondary | ICD-10-CM

## 2022-12-22 LAB — VAS US ABI WITH/WO TBI
Left ABI: 0.94
Right ABI: 0.73

## 2022-12-22 NOTE — Progress Notes (Signed)
REASON FOR VISIT:   Follow-up of peripheral arterial disease.  MEDICAL ISSUES:   PERIPHERAL ARTERIAL DISEASE: This patient has a known right external iliac artery occlusion.  She is asymptomatic with no significant claudication or rest pain.  Her ABI is stable at 73% which is unchanged over the last year.  She has biphasic signals in the foot on the right.  I have encouraged her to stay as active as possible.  She is on aspirin and is on a statin.  She is not a smoker.  I do not think further vascular workup is indicated unless she develops rest pain or nonhealing ulcer.  I have explained that I will be retiring so she will be seen back in 1 year with ABIs on the PA schedule.  She knows to call sooner if she has problems.  HPI:   [name] is a pleasant 62 y.o. female who I last saw on 12/16/2021 with a right external iliac artery occlusion.  She had stable claudication of the right thigh.  She was not a smoker.  I encouraged her to get on a structured walking program.  She comes in for a 1 year follow-up visit.  She has a significant cardiac history.  She had aortic and mitral valve repairs in 2010 and then underwent a TAVR in July 2019.  On my history today, the patient denies any history of claudication, rest pain, or nonhealing ulcers.  She is not a smoker.  She is on aspirin and is on a statin.  She remains fairly active.  She did have 1 episode of some right leg pain that occurred at night and she thinks she may have slept on the leg funny.  The symptoms resolved on their own.  Past Medical History:  Diagnosis Date   Chronic combined systolic and diastolic CHF (congestive heart failure) (HCC)    EF now down to 25-30%. LVEDP on cath was 30 mmHg with wedge pressure of 31 mmHg.   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  mean PA 74/39 mean 52, mean PCWP 31 (with V wave of 45 mmHg), LVEDP ~ 30 mmHg.   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 Progressive worsening of aortic and mitral valve disease.    Family History  Problem Relation Age of Onset   Heart disease Mother    CVA Mother     SOCIAL HISTORY: Social History   Tobacco Use   Smoking status: Former    Current packs/day: 0.00    Average packs/day: 0.1 packs/day for 15.0 years (1.5 ttl pk-yrs)    Types: Cigarettes    Start date: 69    Quit date: 2011    Years since quitting: 13.5   Smokeless tobacco: Never  Substance Use Topics   Alcohol use: Never    Allergies  Allergen Reactions   Morphine And Codeine Other (See Comments)    Hallucinations     Current Outpatient Medications  Medication Sig Dispense Refill    acetaminophen (TYLENOL) 325 MG tablet Take 650 mg every 6 (six) hours as needed by mouth for mild pain.     albuterol (PROVENTIL HFA;VENTOLIN HFA) 108 (90 BASE) MCG/ACT inhaler Inhale 2 puffs into the lungs every 6 (six) hours as needed for shortness of breath.      apixaban (ELIQUIS) 5 MG TABS tablet Take 1 tablet (5 mg total) by mouth 2 (two) times daily. 60 tablet 11   aspirin 81 MG chewable tablet Chew 1 tablet (81 mg total) by mouth daily. 30 tablet 3   beclomethasone (QVAR) 40 MCG/ACT inhaler Inhale 2 puffs into the lungs daily as needed (shortness of breath).     carvedilol (COREG) 12.5 MG tablet Take 1 tablet (12.5 mg total) by mouth 2 (two) times daily with a meal. 180 tablet 3   dapagliflozin propanediol (FARXIGA) 10 MG TABS tablet Take 1 tablet (10 mg total) by mouth daily before breakfast. 90 tablet 3   furosemide (LASIX) 20 MG tablet Take 1 tablet (20 mg total) by mouth daily. 30 tablet 11   levothyroxine (SYNTHROID) 125 MCG tablet Take 125 mcg by mouth daily before breakfast. Except sunday     potassium chloride SA (KLOR-CON M) 20 MEQ tablet Take 1 tablet (20 mEq total) by mouth daily. 90 tablet 3   rosuvastatin (CRESTOR) 20 MG tablet TAKE 1 TABLET(20 MG) BY MOUTH DAILY 30 tablet 11   sacubitril-valsartan (ENTRESTO) 49-51 MG Take 1 tablet by mouth 2 (two) times daily. 60 tablet 11   spironolactone (ALDACTONE) 25 MG tablet TAKE 1 TABLET(25 MG) BY MOUTH DAILY 30 tablet 11   No current facility-administered medications for this visit.    REVIEW OF SYSTEMS:  [X]  denotes positive finding, [ ]  denotes negative finding Cardiac  Comments:  Chest pain or chest pressure:    Shortness of breath upon exertion:    Short of breath when lying flat:    Irregular heart rhythm:        Vascular    Pain in calf, thigh, or hip brought on by ambulation:    Pain in feet at night that wakes you up from your sleep:     Blood clot in your veins:    Leg swelling:         Pulmonary    Oxygen at  home:    Productive cough:     Wheezing:         Neurologic    Sudden weakness in arms or legs:     Sudden numbness in arms or legs:     Sudden onset of difficulty speaking or slurred speech:    Temporary loss  of vision in one eye:     Problems with dizziness:         Gastrointestinal    Blood in stool:     Vomited blood:         Genitourinary    Burning when urinating:     Blood in urine:        Psychiatric    Major depression:         Hematologic    Bleeding problems:    Problems with blood clotting too easily:        Skin    Rashes or ulcers:        Constitutional    Fever or chills:     PHYSICAL EXAM:   Vitals:   12/22/22 1444  BP: 100/66  Pulse: 61  Resp: 20  Temp: 98.1 F (36.7 C)  SpO2: 96%  Weight: 192 lb (87.1 kg)  Height: 4\' 9"  (1.448 m)    GENERAL: The patient is a well-nourished female, in no acute distress. The vital signs are documented above. CARDIAC: There is a regular rate and rhythm.  VASCULAR: I do not detect carotid bruits. On the right side I cannot palpate a femoral pulse.  She does have a palpable left femoral pulse. I cannot palpate pedal pulses. PULMONARY: There is good air exchange bilaterally without wheezing or rales. ABDOMEN: Soft and non-tender with normal pitched bowel sounds.  MUSCULOSKELETAL: There are no major deformities or cyanosis. NEUROLOGIC: No focal weakness or paresthesias are detected. SKIN: There are no ulcers or rashes noted. PSYCHIATRIC: The patient has a normal affect.  DATA:    ARTERIAL DOPPLER STUDY: I have independently interpreted her arterial Doppler study today.  On the right side there is a biphasic posterior tibial and dorsalis pedis signal.  ABIs 73%.  Toe pressure 61 mmHg.  On the left side there is a triphasic posterior tibial and dorsalis pedis signal.  ABIs 94%.  Toe pressure 76 mmHg.  Waverly [facility] [phone]

## 2022-12-23 ENCOUNTER — Other Ambulatory Visit: Payer: Self-pay

## 2022-12-23 ENCOUNTER — Ambulatory Visit (HOSPITAL_COMMUNITY)
Admission: RE | Admit: 2022-12-23 | Discharge: 2022-12-23 | Disposition: A | Discharge status: Home / Self Care | Payer: Medicaid Other | Source: Ambulatory Visit | Attending: Cardiology | Admitting: Cardiology

## 2022-12-23 ENCOUNTER — Encounter (HOSPITAL_COMMUNITY): Payer: Self-pay | Admitting: Cardiology

## 2022-12-23 VITALS — BP 96/62 | HR 65 | Wt 193.0 lb

## 2022-12-23 DIAGNOSIS — Z953 Presence of xenogenic heart valve: Secondary | ICD-10-CM | POA: Diagnosis not present

## 2022-12-23 DIAGNOSIS — I052 Rheumatic mitral stenosis with insufficiency: Secondary | ICD-10-CM | POA: Insufficient documentation

## 2022-12-23 DIAGNOSIS — E669 Obesity, unspecified: Secondary | ICD-10-CM | POA: Insufficient documentation

## 2022-12-23 DIAGNOSIS — Z87891 Personal history of nicotine dependence: Secondary | ICD-10-CM | POA: Diagnosis not present

## 2022-12-23 DIAGNOSIS — Z8249 Family history of ischemic heart disease and other diseases of the circulatory system: Secondary | ICD-10-CM | POA: Diagnosis not present

## 2022-12-23 DIAGNOSIS — Z7901 Long term (current) use of anticoagulants: Secondary | ICD-10-CM | POA: Diagnosis not present

## 2022-12-23 DIAGNOSIS — I5022 Chronic systolic (congestive) heart failure: Secondary | ICD-10-CM | POA: Diagnosis not present

## 2022-12-23 DIAGNOSIS — E785 Hyperlipidemia, unspecified: Secondary | ICD-10-CM | POA: Insufficient documentation

## 2022-12-23 DIAGNOSIS — Z7984 Long term (current) use of oral hypoglycemic drugs: Secondary | ICD-10-CM | POA: Diagnosis not present

## 2022-12-23 DIAGNOSIS — Z79899 Other long term (current) drug therapy: Secondary | ICD-10-CM | POA: Insufficient documentation

## 2022-12-23 DIAGNOSIS — Z6841 Body Mass Index (BMI) 40.0 and over, adult: Secondary | ICD-10-CM | POA: Insufficient documentation

## 2022-12-23 DIAGNOSIS — Z7982 Long term (current) use of aspirin: Secondary | ICD-10-CM | POA: Insufficient documentation

## 2022-12-23 DIAGNOSIS — I428 Other cardiomyopathies: Secondary | ICD-10-CM | POA: Insufficient documentation

## 2022-12-23 DIAGNOSIS — Z5982 Transportation insecurity: Secondary | ICD-10-CM | POA: Insufficient documentation

## 2022-12-23 DIAGNOSIS — I110 Hypertensive heart disease with heart failure: Secondary | ICD-10-CM | POA: Diagnosis not present

## 2022-12-23 DIAGNOSIS — I7389 Other specified peripheral vascular diseases: Secondary | ICD-10-CM | POA: Diagnosis not present

## 2022-12-23 DIAGNOSIS — Z823 Family history of stroke: Secondary | ICD-10-CM | POA: Diagnosis not present

## 2022-12-23 DIAGNOSIS — I739 Peripheral vascular disease, unspecified: Secondary | ICD-10-CM

## 2022-12-23 LAB — BASIC METABOLIC PANEL
Anion gap: 11 (ref 5–15)
BUN: 25 mg/dL — ABNORMAL HIGH (ref 8–23)
CO2: 21 mmol/L — ABNORMAL LOW (ref 22–32)
Calcium: 9 mg/dL (ref 8.9–10.3)
Chloride: 102 mmol/L (ref 98–111)
Creatinine, Ser: 1.39 mg/dL — ABNORMAL HIGH (ref 0.44–1.00)
GFR, Estimated: 43 mL/min — ABNORMAL LOW (ref >60)
Glucose, Bld: 106 mg/dL — ABNORMAL HIGH (ref 70–99)
Potassium: 4.4 mmol/L (ref 3.5–5.1)
Sodium: 134 mmol/L — ABNORMAL LOW (ref 135–145)

## 2022-12-23 LAB — HEMOGLOBIN A1C
Hgb A1c MFr Bld: 6.5 % — ABNORMAL HIGH (ref 4.8–5.6)
Mean Plasma Glucose: 139.85 mg/dL

## 2022-12-23 LAB — CBC
HCT: 44.3 % (ref 36.0–46.0)
Hemoglobin: 14.2 g/dL (ref 12.0–15.0)
MCH: 27.4 pg (ref 26.0–34.0)
MCHC: 32.1 g/dL (ref 30.0–36.0)
MCV: 85.4 fL (ref 80.0–100.0)
Platelets: 107 10*3/uL — ABNORMAL LOW (ref 150–400)
RBC: 5.19 MIL/uL — ABNORMAL HIGH (ref 3.87–5.11)
RDW: 15.9 % — ABNORMAL HIGH (ref 11.5–15.5)
WBC: 8.2 10*3/uL (ref 4.0–10.5)
nRBC: 0 % (ref 0.0–0.2)

## 2022-12-23 LAB — LIPID PANEL
Cholesterol: 87 mg/dL (ref 0–200)
HDL: 34 mg/dL — ABNORMAL LOW (ref >40)
LDL Cholesterol: 35 mg/dL (ref 0–99)
Total CHOL/HDL Ratio: 2.6 RATIO
Triglycerides: 90 mg/dL (ref <150)
VLDL: 18 mg/dL (ref 0–40)

## 2022-12-23 NOTE — Patient Instructions (Addendum)
Good to see you today!  You have been referred to pharmacy clinic for semalutide they will call to schedule an appointment  Lab work done today we will call you with any abnormal results  Your physician recommends that you schedule a follow-up appointment in: 4 months with app clinic  If you have any questions or concerns before your next appointment please send [place] a message through [place] or call our office at [phone].    TO LEAVE A MESSAGE FOR THE NURSE SELECT OPTION 2, PLEASE LEAVE A MESSAGE INCLUDING: YOUR NAME DATE OF BIRTH CALL BACK NUMBER REASON FOR CALL**this is important as we prioritize the call backs  YOU WILL RECEIVE A CALL BACK THE SAME DAY AS LONG AS YOU CALL BEFORE 4:00 PM  At the [facility], you and your health needs are our priority. As part of our continuing mission to provide you with exceptional heart care, we have created designated Provider Care Teams. These Care Teams include your primary Cardiologist (physician) and Advanced Practice Providers (APPs- Physician Assistants and Nurse Practitioners) who all work together to provide you with the care you need, when you need it.   You may see any of the following providers on your designated Care Team at your next follow up: Dr Arvilla Meres Dr Marca Ancona Dr. Marcos Eke, NP Robbie Lis, [place] [facility] [place], [place] Brynda Peon, NP Karle Plumber, PharmD   Please be sure to bring in all your medications bottles to every appointment.    Thank you for choosing [facility]

## 2022-12-25 NOTE — Progress Notes (Signed)
Advanced Heart Failure Clinic Note   PCP: Patient, No Pcp Per Cardiology: Dr. Herbie Baltimore HF Cardiology: Dr. Shirlee Latch  62 y.o. 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  She had aortic and mitral repairs in 2010.  Afterwards, EF was mildly low in the 40-45% range.  However, EF had dropped to 25-30% on 1/18 echo.  TEE showed severe MR, moderate AI, moderate aortic stenosis.  RHC/LHC in 2/18 showed significantly elevated LV and RV filling pressures.    At her initial appointment here, she was very dyspneic with exertion, with orthopnea and PND. Her meds were adjusted and Lasix increased.  She has seen Dr. Cornelius Moras since that time for evaluation for redo valve surgery.  She would require high risk mitral and aortic valve replacements. Plan was to follow closely w/ Dr. Cornelius Moras for monitoring for surgery if further decline/ frequent CHF exacerbations. CPX was done in 3/18 showing moderate to severely decreased functional capacity.  RHC in 4/18 showed optimized filling pressures but low cardiac output.  Repeat echo in 8/18 showed EF 30-35%, severe AI with moderate AS but only mild MR noted.  The RV was mildly dilated with mildly decreased systolic function.   TEE 06/28/2017: EF 45%, severe AI, mild to moderate AS, mild mitral valve stenosis s/p MV repair  In 7/19, she had TAVR with #23 Edwards Sapien 3 THV.  Echo post-op in 7/19 showed EF 30-35%, moderate LVH, bioprosthetic aortic valve looked ok, s/p MV repair with mild mitral stenosis.   She developed pain in her right leg and was found to have occluded right EIA on dopplers in 9/19, confirmed by CTA. Suspect this was a vascular access complication of TAVR.  She was seen by Dr. Arbie Cookey who recommended conservative treatment for the time being.  Echo in 2/20 showed that EF remains 30-35%, stable TAVR valve, s/p MVR repair with no regurgitation or significant stenosis.   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 There was concern for possible partial valve thrombosis and gated CT ordered to assess aortic valve.  Echo in 6/23 showed EF 30-35% with wall motion abnormalities, moderate RV dysfunction, s/p mitral valve repair with trivial MR and mean gradient 4 mmHg, bioprosthetic aortic valve s/p TAVR with mean gradient 49 mmHg and AVA 0.67 cm^2. 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 Patient was started on Eliquis.   Repeat gated CTA chest on apixaban in 11/23 showed minimal restriction to bioprosthetic aortic valve excursion with no significant thrombus, significant improvement.    Most recent echo was 1/24, showing EF up to 40-45% with apical hypokinesis, mild RV dysfunction, mild mitral stenosis s/p MV repair with mean gradient 6 mmHg, bioprosthetic aortic valve with mean gradient 13 mmHg (improved), normal IVC.   Today she returns for HF followup.  She has been doing well recently.  Weight is stable. No significant exertional dyspnea.  No chest pain.  No lightheadedness.  No BRBPR/melena. No significant claudication.   ECG (personally reviewed): NSR, inferolateral and anterolateral Qs  Labs (2/18): K 3.8 => 3.4, creatinine 1.09 => 1.07, hgb 12.7,  BNP 1010 Labs (3/18): K 4.6, creatinine 2.43 => 1.74 Labs (4/18): K 4, creatinine 1.28, digoxin 0.3 Labs 09/2016: K 4.4, creatinine 1.26.  Labs 01/19/2017: K 4.4, Creatinine 1.21, digoxin 0.3 Labs (10/18): K 4.7, creatinine 1.10, digoxin 0.3 Labs (12/18): K 4.9, creatinine 1.34, digoxin < 0.2 Labs (8/19): K 3.9, creatinine 1.33 Labs (9/19): K 3.7, creatinine 1.46 Labs  (7/21): K 4.9, creatinine 1.38 Labs (8/22): K 3.8, creatinine 1.65, LDL 174 Labs (11/22): K 4.3, creatinine 1.21, LDL 30, LFTs ok Labs (2/23): K 4.3, creatinine 1.41 Labs (5/23): K 4.1, creatinine 1.49, BNP 315 Labs (7/23): LDL 45 Labs (11/23): K 4.3, creatinine 1.06 Labs (1/24): K 4.2, creatinine 1.3  Labs (08/25/22): K 3.4 Creatinine 0.95  Labs (09/02/22): K 4.2 Creatinine 1.17, BNP 161, hgb 13.1  PMH: 1. HTN 2. Hypothyroidism 3. Hyperlipidemia 4. Rheumatic heart disease: s/p MV repair and aortic valve repair in 2010, Dr. Cornelius Moras. - TEE (2/18): EF 35-30%, dilated LV, rheumatic-appearing aortic valve s/p repair with moderate AS (AVA 1.1 cm^2), moderate AI.  MV s/p repair with mild-moderate mitral stenosis and eccentric severe MR.   - Echo (8/18): Severe AI but only moderate AS, MR was only mild on this study (may have missed full jet) with mean MV gradient 4 mmHg.  - TEE 06/28/2017: EF 45%, severe AI, mild to moderate AS, mild mitral valve stenosis s/p MV repair - In 7/19, she had TAVR with #23 Edwards Sapien 3 THV.   - Echo post-op in 7/19 showed EF 30-35%, moderate LVH, bioprosthetic aortic valve looked ok, s/p MV repair with mild mitral stenosis.  - Echo in 11/22 showed TAVR valve with significant stenosis and regurgitation => mild-moderate PVL with mean gradient 29 mmHg and DI 0.22.  There was mild stenosis of the repaired mitral valve.  - Gated CTA chest (6/23) showed HALT/HAM involving all 3 leaflets of the bioprosthetic aortic valve with restriction to motion - Gated CTA chest (11/23, on apixaban): minimal restriction to bioprosthetic aortic valve excursion with no significant thrombus, significant improvement.   5. Chronic systolic CHF: Nonischemic cardiomyopathy, probably related to valvular disease.   - Echo (11/15): EF 40-45%, - Echo (12/16): EF 35-40%. - Echo (1/18): EF 25-30%.  - LHC/RHC (2/18): Normal coronaries, mean RA 18, mean PA 74/39 mean 52, mean PCWP 31, CI 1.98 Fick. - CPX  (3/18): peak VO2 11.7, VE/VCO2 slope 40, RER 1.11 => moderate to severely decreased functional capacity due to HF.  - RHC (4/18): mean RA 4, PA 44/17 mean 28, mean PCWP 15, CI 1.73, PVR 4 WU.  - Echo (8/18): EF 30-35%, severe AI with moderate AS but only mild MR noted with mean MV gradient 4 mmHg.  The RV was mildly dilated with mildly decreased systolic function. - TEE 2/19: EF 45%, severe aortic insufficiency, mild to moderate AS, mild mitral valve stenosis s/p MV repair - Coronary CTA (5/19): No obstructive coronary disease.  - Echo (7/19): EF 30-35%, moderate LVH, bioprosthetic aortic valve looked ok, s/p MV repair with mild mitral stenosis.  - Echo (2/20): EF 30-35%, moderate LVH with diffuse hypokinesis, mildly decreased RV systolic function, bioprosthetic aortic valve s/p TAVR without significant stenosis or regurgitation, s/p MV repair with mean gradient 3 mmHg and no MR.  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  - Echo (  6/23):  EF 30-35% with wall motion abnormalities, moderate RV dysfunction, s/p mitral valve repair with trivial MR and mean gradient 4 mmHg, bioprosthetic aortic valve s/p TAVR with mean gradient 49 mmHg and AVA 0.67 cm^2.  - Echo (1/24): EF 40-45% with apical hypokinesis, mild RV dysfunction, mild mitral stenosis s/p MV repair with mean gradient 6 mmHg, bioprosthetic aortic valve with mean gradient 13 mmHg (improved), normal IVC.  6. Carotid dopplers (6/19): No significant disease.  7. PAD: 9/19 peripheral arterial dopplers showed total occluded proximal right external iliac artery.  - CTA confirmed total occlusion of R EIA with collaterals.  Suspect TAVR vascular access complication.  - Peripheral arterial dopplers (6/23): totally occluded right mid to distal R  CFA. - ABIs (8/24): 0.73 right, 0.94 left.   Social History   Socioeconomic History   Marital status: Single    Spouse name: Not on file   Number of children: Not on file   Years of education: Not on file   Highest education level: Not on file  Occupational History   Not on file  Tobacco Use   Smoking status: Former    Current packs/day: 0.00    Average packs/day: 0.1 packs/day for 15.0 years (1.5 ttl pk-yrs)    Types: Cigarettes    Start date: 11    Quit date: 2011    Years since quitting: 13.6   Smokeless tobacco: Never  Vaping Use   Vaping status: Never Used  Substance and Sexual Activity   Alcohol use: Never   Drug use: Never   Sexual activity: Not Currently  Other Topics Concern   Not on file  Social History Narrative   Unemployed, on disability.   Now is finally reestablished with a PCP.   She is a former smoker, quit in 2011. Denies alcohol consumption   Tries to walk routinely.   Social Determinants of Health   Financial Resource Strain: Not on file  Food Insecurity: Not on file  Transportation Needs: Unmet Transportation Needs (06/01/2022)   PRAPARE - Administrator, Civil Service (Medical): Yes    Lack of Transportation (Non-Medical): Yes  Physical Activity: Not on file  Stress: Not on file  Social Connections: Unknown (10/03/2021)   Received from [facility], [facility]   Social Network    Social Network: Not on file  Intimate Partner Violence: Unknown (08/25/2021)   Received from [facility], [facility]   HITS    Physically Hurt: Not on file    Insult or Talk Down To: Not on file    Threaten Physical Harm: Not on file    Scream or Curse: Not on file   Family History  Problem Relation Age of Onset   Heart disease Mother    CVA Mother    Review of systems complete and found to be negative unless listed in HPI.    Current Outpatient Medications  Medication Sig Dispense Refill   acetaminophen (TYLENOL) 325 MG tablet Take  650 mg every 6 (six) hours as needed by mouth for mild pain.     albuterol (PROVENTIL HFA;VENTOLIN HFA) 108 (90 BASE) MCG/ACT inhaler Inhale 2 puffs into the lungs every 6 (six) hours as needed for shortness of breath.      apixaban (ELIQUIS) 5 MG TABS tablet Take 1 tablet (5 mg total) by mouth 2 (two) times daily. 60 tablet 11   aspirin 81 MG chewable tablet Chew 1 tablet (81 mg total) by mouth daily. 30 tablet 3  beclomethasone (QVAR) 40 MCG/ACT inhaler Inhale 2 puffs into the lungs daily as needed (shortness of breath).     carvedilol (COREG) 12.5 MG tablet Take 1 tablet (12.5 mg total) by mouth 2 (two) times daily with a meal. 180 tablet 3   dapagliflozin propanediol (FARXIGA) 10 MG TABS tablet Take 1 tablet (10 mg total) by mouth daily before breakfast. 90 tablet 3   furosemide (LASIX) 20 MG tablet Take 1 tablet (20 mg total) by mouth daily. 30 tablet 11   levothyroxine (SYNTHROID) 125 MCG tablet Take 125 mcg by mouth daily before breakfast. Except sunday     potassium chloride SA (KLOR-CON M) 20 MEQ tablet Take 1 tablet (20 mEq total) by mouth daily. 90 tablet 3   rosuvastatin (CRESTOR) 20 MG tablet TAKE 1 TABLET(20 MG) BY MOUTH DAILY 30 tablet 11   sacubitril-valsartan (ENTRESTO) 49-51 MG Take 1 tablet by mouth 2 (two) times daily. 60 tablet 11   spironolactone (ALDACTONE) 25 MG tablet TAKE 1 TABLET(25 MG) BY MOUTH DAILY 30 tablet 11   No current facility-administered medications for this encounter.   BP 96/62   Pulse 65   Wt 87.5 kg (193 lb)   SpO2 99%   BMI 41.76 kg/m   Wt Readings from Last 3 Encounters:  12/23/22 87.5 kg (193 lb)  12/22/22 87.1 kg (192 lb)  09/22/22 87.4 kg (192 lb 9.6 oz)   PHYSICAL EXAM: General: NAD Neck: No JVD, no thyromegaly or thyroid nodule.  Lungs: Clear to auscultation bilaterally with normal respiratory effort. CV: Nondisplaced PMI.  Heart regular S1/S2, no S3/S4, 1/6 SEM RUSBk.  No peripheral edema.  No carotid bruit.  Normal pedal pulses.   Abdomen: Soft, nontender, no hepatosplenomegaly, no distention.  Skin: Intact without lesions or rashes.  Neurologic: Alert and oriented x 3.  Psych: Normal affect. Extremities: No clubbing or cyanosis.  HEENT: Normal.   Assessment/Plan: 1. 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  She had aortic and mitral valve repairs in 2010.   It was felt that she would not be a very good Mitraclip candidate as she already has a degree of mitral stenosis.  TEE in 2/18 showed moderate AS/moderate AI , mild to moderate MS, severe eccentric MR.  Echo in 8/18 showed moderate AS/severe AI but only mild mitral stenosis and mild MR noted. Hemodynamics looked better on 4/18 RHC though cardiac output still marginal.  Repeat TEE 06/28/2017: EF 45%, severe aortic insufficiency, mild to moderate AS, mild mitral valve stenosis s/p MV repair with trivial MR. She had TAVR in 7/19, feeling much better since then.  Echo 6/21 showed stable bioprosthetic aortic valve, s/p mitral valve repair without significant stenosis or regurgitation. Echo 11/22,? partial-valve thrombosis, elevated mean gradient 29 mm Hg, previously 22 mm HG 6/21. Echo in 6/23 showed mean gradient up to 49 mmHg with AVA 0.67 cm^2.  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  Eliquis was started. Repeat gated CTA chest in 11/23 showed no significant restriction to bioprosthetic aortic valve excursion with no significant thrombus => marked improvement.  Echo 1/24 showed mean gradient down to 13 mmHg across the aortic valve.  - Plan to continue Eliquis 5 mg bid long-term. No bleeding issues.  - Continue ASA 81 daily.  2. Chronic systolic CHF:  Nonischemic cardiomyopathy (no significant disease on 2/18 LHC).  It is possible that this cardiomyopathy is driven by  her valvular disease. Echo (8/18) with EF 30-35% RV mildly dilated. TEE  06/28/2017: EF 45%, severe aortic insufficiency, mild to moderate AS, mild mitral valve stenosis s/p MV repair with only trivial MR. Post-TAVR echo showed EF down to 30-35%, and echo in 2/20 showed EF stable in 30-35% range.  This may reflect myocardial dysfunction from prior long-standing aortic insufficiency.  Echo 6/21 showed EF 40-45%. Echo (11/22): EF 35-40%. Echo in 6/23 showed EF 30-35%.  Echo 1/24 was improved with EF 40-45% with apical hypokinesis, mild RV dysfunction, mild mitral stenosis s/p MV repair with mean gradient 6 mmHg, bioprosthetic aortic valve with mean gradient 13 mmHg (improved), normal IVC.  NYHA II, not volume overloaded on exam.  Overall doing well. No BP room today to titrate meds.   - Continue lasix 20 mg daily. BMET today.  - Continue Farxiga 10 mg  - Continue Entresto 49/51 mg bid. - Continue spironolactone 25 mg daily.  - Continue Coreg 12.5 mg bid.  3. PAD: Occluded right EIA likely due to TAVR vascular access complication.  Occluded right CFA on last doppler study. Has seen [facility], plan for medical management.  Denies claudication    - Followup with [facility].   4. Hyperlipidemia: on Crestor  - Check lipids today.  5. Obesity: Body mass index is 41.76 kg/m.   - Refer to pharmacy clinic for Ozempic or Mounjaro.    Follow up in 4 months with APP  Marca Ancona,  12/25/2022

## 2023-01-13 ENCOUNTER — Ambulatory Visit: Payer: Medicaid Other

## 2023-01-13 NOTE — Progress Notes (Deleted)
Office Visit    Patient Name: [name] Date of Encounter: 01/13/2023  Primary Care Provider:  Patient, No Pcp Per Primary Cardiologist:  None  Chief Complaint    Weight management  Significant Past Medical History   CHF On-ischemic cardiomyopathy, echo 1/24 with EF improved to 40-45%, apical hypokinesis; on GDMT x 4, furosemide  PAD Occluded right EIA 2/2 TAVR vascular access complication, followed by [facility]  Rheumatic heart disease Aortic and mitral valve repairs in 2010, TAVR in 2019, on Eliquis, ASA  HLD  8/24 LDL 35 on rosuvastatin 20  DM2 8/24 A1c 6.5 on dapagliflozin    Allergies  Allergen Reactions   Morphine And Codeine Other (See Comments)    Hallucinations     History of Present Illness    [name] is a 62 y.o. female patient of Dr ***,  [name] If diabetic and on insulin/sulfonylurea, can consider reducing dose to reduce risk of hypoglycemia  *** Follow-up visit  Assess % weight loss Assess adverse effects Missed doses  Current weight management medications:   Previously tried meds:   Current meds that may affect weight:   Baseline weight/BMI:   Insurance payor:   Diet:   Exercise:   Family History:   Confirmed patient not ***pregnant and no personal or family history of medullary thyroid carcinoma (MTC) or Multiple Endocrine Neoplasia syndrome type 2 (MEN 2).   Social History:   Tobacco:  Alcohol:  Caffeine:   Adherence Assessment  Do you ever forget to take your medication? [] Yes [] No  Do you ever skip doses due to side effects? [] Yes [] No  Do you have trouble affording your medicines? [] Yes [] No  Are you ever unable to pick up your medication due to transportation difficulties? [] Yes [] No  Do you ever stop taking your medications because you don't believe they are helping? [] Yes [] No  Do you check your weight daily? [] Yes [] No   Adherence strategy: ***  Barriers to obtaining medications: ***   Accessory  Clinical Findings    Lab Results  Component Value Date   CREATININE 1.39 (H) 12/23/2022   BUN 25 (H) 12/23/2022   NA 134 (L) 12/23/2022   K 4.4 12/23/2022   CL 102 12/23/2022   CO2 21 (L) 12/23/2022   Lab Results  Component Value Date   ALT 20 04/09/2021   AST 25 04/09/2021   ALKPHOS 89 04/09/2021   BILITOT 0.5 04/09/2021   Lab Results  Component Value Date   HGBA1C 6.5 (H) 12/23/2022      Home Medications/Allergies    Current Outpatient Medications  Medication Sig Dispense Refill   acetaminophen (TYLENOL) 325 MG tablet Take 650 mg every 6 (six) hours as needed by mouth for mild pain.     albuterol (PROVENTIL HFA;VENTOLIN HFA) 108 (90 BASE) MCG/ACT inhaler Inhale 2 puffs into the lungs every 6 (six) hours as needed for shortness of breath.      apixaban (ELIQUIS) 5 MG TABS tablet Take 1 tablet (5 mg total) by mouth 2 (two) times daily. 60 tablet 11   aspirin 81 MG chewable tablet Chew 1 tablet (81 mg total) by mouth daily. 30 tablet 3   beclomethasone (QVAR) 40 MCG/ACT inhaler Inhale 2 puffs into the lungs daily as needed (shortness of breath).     carvedilol (COREG) 12.5 MG tablet Take 1 tablet (12.5 mg total) by mouth 2 (two) times daily with a meal. 180 tablet 3   dapagliflozin propanediol (FARXIGA) 10 MG TABS tablet  Take 1 tablet (10 mg total) by mouth daily before breakfast. 90 tablet 3   furosemide (LASIX) 20 MG tablet Take 1 tablet (20 mg total) by mouth daily. 30 tablet 11   levothyroxine (SYNTHROID) 125 MCG tablet Take 125 mcg by mouth daily before breakfast. Except sunday     potassium chloride SA (KLOR-CON M) 20 MEQ tablet Take 1 tablet (20 mEq total) by mouth daily. 90 tablet 3   rosuvastatin (CRESTOR) 20 MG tablet TAKE 1 TABLET(20 MG) BY MOUTH DAILY 30 tablet 11   sacubitril-valsartan (ENTRESTO) 49-51 MG Take 1 tablet by mouth 2 (two) times daily. 60 tablet 11   spironolactone (ALDACTONE) 25 MG tablet TAKE 1 TABLET(25 MG) BY MOUTH DAILY 30 tablet 11   No  current facility-administered medications for this visit.     Allergies  Allergen Reactions   Morphine And Codeine Other (See Comments)    Hallucinations     Assessment & Plan    No problem-specific Assessment & Plan notes found for this encounter.   Phillips Hay PharmD CPP [facility]  [address] [place], [place] [address] [phone]

## 2023-01-25 ENCOUNTER — Encounter: Payer: Self-pay | Admitting: Pharmacy Technician

## 2023-01-25 ENCOUNTER — Ambulatory Visit: Payer: Medicaid Other | Attending: Cardiovascular Disease | Admitting: Student

## 2023-01-25 ENCOUNTER — Encounter: Payer: Self-pay | Admitting: Student

## 2023-01-25 ENCOUNTER — Telehealth: Payer: Self-pay | Admitting: Pharmacist

## 2023-01-25 ENCOUNTER — Other Ambulatory Visit (HOSPITAL_COMMUNITY): Payer: Self-pay

## 2023-01-25 ENCOUNTER — Telehealth: Payer: Self-pay | Admitting: Pharmacy Technician

## 2023-01-25 VITALS — Ht <= 58 in | Wt 193.0 lb

## 2023-01-25 DIAGNOSIS — Z7984 Long term (current) use of oral hypoglycemic drugs: Secondary | ICD-10-CM | POA: Diagnosis not present

## 2023-01-25 DIAGNOSIS — E119 Type 2 diabetes mellitus without complications: Secondary | ICD-10-CM | POA: Diagnosis not present

## 2023-01-25 NOTE — Telephone Encounter (Signed)
Will initiate the PA for [facility].

## 2023-01-25 NOTE — Telephone Encounter (Signed)
Will do!

## 2023-01-25 NOTE — Progress Notes (Signed)
Patient ID: [name]                 DOB: 05/25/1960                    MRN: [id number]     HPI: [name] is a 62 y.o. female patient referred to pharmacy clinic by [facility] to initiate GLP1-RA therapy. 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 ) . Most recent BMI 41.54.  Baseline weight and BMI: 41.54 weight 193 lbs   Current meds that affect weight: synthroid   Diet:  Breakfast - eggs,grits Lunch: salads with grilled chicken with non -fat dressing  Dinner : pasta, salad, potatoes -seldom, baked chicken and fish  Drink: water, may drink juice -apple juice every 2 week,  sweet tea  Exercise: takes stairs often and walk 10-15 min daily around the house or go to the nearby store  Family History:  Relation Problem Comments  Mother (Deceased) CVA   Heart disease     Sister - pacemaker  Sister- CVA at age 41  Father - alcohol problem   Social History:  Alcohol: none  Smoking: quit 20 years ago   Labs: Lab Results  Component Value Date   HGBA1C 6.5 (H) 12/23/2022    Wt Readings from Last 1 Encounters:  12/23/22 193 lb (87.5 kg)    BP Readings from Last 1 Encounters:  12/23/22 96/62   Pulse Readings from Last 1 Encounters:  12/23/22 65       Component Value Date/Time   CHOL 87 12/23/2022 1415   TRIG 90 12/23/2022 1415   HDL 34 (L) 12/23/2022 1415   CHOLHDL 2.6 12/23/2022 1415   VLDL 18 12/23/2022 1415   LDLCALC 35 12/23/2022 1415    Past Medical History:  Diagnosis Date   Chronic combined systolic and diastolic CHF (congestive heart failure) (HCC)    EF now down to 25-30%. LVEDP on cath was 30 mmHg with wedge pressure of 31 mmHg.   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  mean PA 74/39 mean 52, mean PCWP 31 (with V wave of 45 mmHg), LVEDP ~ 30 mmHg.   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 Progressive worsening of aortic and mitral valve disease.    Current Outpatient Medications on File Prior to Visit  Medication Sig Dispense Refill   acetaminophen (TYLENOL) 325 MG tablet Take 650 mg every 6 (six) hours as needed by mouth for mild pain.     albuterol (PROVENTIL HFA;VENTOLIN HFA) 108 (90 BASE) MCG/ACT inhaler Inhale 2 puffs into the lungs every 6 (six) hours as needed for shortness of breath.      apixaban (ELIQUIS) 5 MG TABS tablet Take 1 tablet (5 mg total) by mouth 2 (two) times daily. 60 tablet 11   aspirin 81 MG chewable tablet Chew 1 tablet (81 mg total)  by mouth daily. 30 tablet 3   beclomethasone (QVAR) 40 MCG/ACT inhaler Inhale 2 puffs into the lungs daily as needed (shortness of breath).     carvedilol (COREG) 12.5 MG tablet Take 1 tablet (12.5 mg total) by mouth 2 (two) times daily with a meal. 180 tablet 3   dapagliflozin propanediol (FARXIGA) 10 MG TABS tablet Take 1 tablet (10 mg total) by mouth daily before breakfast. 90 tablet 3   furosemide (LASIX) 20 MG tablet Take 1 tablet (20 mg total) by mouth daily. 30 tablet 11   levothyroxine (SYNTHROID) 125 MCG tablet Take 125 mcg by mouth daily before breakfast. Except sunday     potassium chloride SA (KLOR-CON M) 20 MEQ tablet Take 1 tablet (20 mEq total) by mouth daily. 90 tablet 3   rosuvastatin (CRESTOR) 20 MG tablet TAKE 1 TABLET(20 MG) BY MOUTH DAILY 30 tablet 11   sacubitril-valsartan (ENTRESTO) 49-51 MG Take 1 tablet by mouth 2 (two) times daily. 60 tablet 11   spironolactone (ALDACTONE) 25 MG tablet TAKE 1 TABLET(25 MG) BY MOUTH DAILY 30 tablet 11   No current facility-administered medications on file prior to visit.    Allergies  Allergen Reactions   Morphine And Codeine Other (See Comments)    Hallucinations      Assessment/Plan:  1. Weight loss - Patient has not met goal of at least 5% of body weight loss with comprehensive lifestyle modifications alone in the past 3-6 months. Pharmacotherapy is appropriate to pursue as augmentation. Will start Ozempic 0.25 mg once week or Mounjaro 2.5 mg . Confirmed patient has no personal or family history of medullary thyroid carcinoma (MTC) or Multiple Endocrine Neoplasia syndrome type 2 (MEN 2). Injection technique reviewed at today's visit.  Advised patient on common side effects including nausea, diarrhea, dyspepsia, decreased appetite, and fatigue. Counseled patient on reducing meal size and how to titrate medication to minimize side effects. Counseled patient to call if intolerable side effects or if experiencing dehydration,  abdominal pain, or dizziness. Patient will adhere to dietary modifications and will target at least 150 minutes of moderate intensity exercise weekly.   Follow up in 1 month via telephone for tolerability update and dose titration.  Noted her A1c 6.5 with Marcelline Deist will assess Mounjaro coverage. Patient has hx of papillary thyroid cancer so In 2023 Margaretmary Dys, PharmD has reached out to Dr. Dewitt Hoes to get approval to start Ozempic. And we received ok from Dr.Usoh. however, looks like patient never called back to start the therapy. LM to Dr.Usoh office, spoke to  Nydia, to check is it still ok for patient to go on GLP1 or GLP1/GIP agent to get A1c lowered and get weight loss benefit.

## 2023-01-25 NOTE — Telephone Encounter (Signed)
PA requested for [facility] patient has PAD, hx of TIA, BMI >40

## 2023-01-25 NOTE — Telephone Encounter (Signed)
Pharmacy Patient Advocate Encounter   Received notification from Pt Calls Messages that prior authorization for mounjaro is required/requested.   Insurance verification completed.   The patient is insured through [facility] .   Per test claim: PA required; PA submitted to [facility] via CoverMyMeds Key/confirmation #/EOC   Status is pending

## 2023-01-25 NOTE — Telephone Encounter (Signed)
error 

## 2023-01-26 ENCOUNTER — Other Ambulatory Visit (HOSPITAL_COMMUNITY): Payer: Self-pay

## 2023-01-26 ENCOUNTER — Telehealth: Payer: Self-pay | Admitting: Pharmacy Technician

## 2023-01-26 NOTE — Telephone Encounter (Signed)
Pharmacy Patient Advocate Encounter   Received notification from Pt Calls Messages that prior authorization for ozempic is required/requested.   Insurance verification completed.   The patient is insured through [facility] .   Per test claim: PA required; PA submitted to [facility] via CoverMyMeds Key/confirmation #/EOC [id number] Status is pending

## 2023-01-26 NOTE — Telephone Encounter (Signed)
Will do!

## 2023-01-26 NOTE — Telephone Encounter (Signed)
Pharmacy Patient Advocate Encounter  Received notification from [facility] that Prior Authorization for ozempic has been APPROVED from 01/26/23 to 01/26/24   PA #/Case ID/Reference #: [id number]

## 2023-01-26 NOTE — Telephone Encounter (Signed)
Pharmacy Patient Advocate Encounter  Received notification from  [employer]  that Prior Authorization for mounjaro has been DENIED.  Full denial letter will be uploaded to the media tab. See denial reason below.   PA #/Case ID/Reference #: [id number]

## 2023-01-27 MED ORDER — SEMAGLUTIDE(0.25 OR 0.5MG/DOS) 2 MG/3ML ~~LOC~~ SOPN: 0.2500 mg | PEN_INJECTOR | SUBCUTANEOUS | 0 refills | Status: DC

## 2023-01-27 NOTE — Addendum Note (Signed)
Addended by: Tylene Fantasia on: 01/27/2023 12:19 PM   Modules accepted: Orders

## 2023-01-27 NOTE — Telephone Encounter (Signed)
Call Dr.Usoh's office to confirm patient hx of thyroid cancer. Per Rosey Bath patient does not have personal or family hx of medullary thyroid carcinoma and pancreatitis so she is ok to use Ozempic   Call pt to informed about approval. N/A LVM.

## 2023-02-24 ENCOUNTER — Telehealth: Payer: Self-pay | Admitting: Student

## 2023-02-24 NOTE — Telephone Encounter (Signed)
Patient called to talk with the pharmacy about the medication ozempic

## 2023-02-27 MED ORDER — SEMAGLUTIDE(0.25 OR 0.5MG/DOS) 2 MG/3ML ~~LOC~~ SOPN: 0.5000 mg | PEN_INJECTOR | SUBCUTANEOUS | 0 refills | Status: DC

## 2023-02-27 NOTE — Telephone Encounter (Signed)
Spoke to patient, reported she has been tolerating Ozempic 0.25 mg dose well. Will be starting 0.5 mg once a week from this Thursday. Will send prescription for 0.5 mg Ozempic and follow up in 6 weeks for further dose titration.

## 2023-04-11 ENCOUNTER — Telehealth: Payer: Self-pay | Admitting: Pharmacist

## 2023-04-11 NOTE — Telephone Encounter (Signed)
Sent MyChart for Ozempic dose titration.

## 2023-04-13 MED ORDER — SEMAGLUTIDE (1 MG/DOSE) 4 MG/3ML ~~LOC~~ SOPN: 1.0000 mg | PEN_INJECTOR | SUBCUTANEOUS | 0 refills | Status: DC

## 2023-04-13 NOTE — Addendum Note (Signed)
Addended by: Tylene Fantasia on: 04/13/2023 03:38 PM   Modules accepted: Orders

## 2023-04-17 NOTE — Progress Notes (Incomplete)
Advanced Heart Failure Clinic Note   PCP: Patient, No Pcp Per Cardiology: Dr. Herbie Baltimore HF Cardiology: Dr. Shirlee Latch  62 y.o. 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  She had aortic and mitral repairs in 2010.  Afterwards, EF was mildly low in the 40-45% range.  However, EF had dropped to 25-30% on 1/18 echo.  TEE showed severe MR, moderate AI, moderate aortic stenosis.  RHC/LHC in 2/18 showed significantly elevated LV and RV filling pressures.    At her initial appointment here, she was very dyspneic with exertion, with orthopnea and PND. Her meds were adjusted and Lasix increased.  She has seen Dr. Cornelius Moras since that time for evaluation for redo valve surgery.  She would require high risk mitral and aortic valve replacements. Plan was to follow closely w/ Dr. Cornelius Moras for monitoring for surgery if further decline/ frequent CHF exacerbations. CPX was done in 3/18 showing moderate to severely decreased functional capacity.  RHC in 4/18 showed optimized filling pressures but low cardiac output.  Repeat echo in 8/18 showed EF 30-35%, severe AI with moderate AS but only mild MR noted.  The RV was mildly dilated with mildly decreased systolic function.   TEE 06/28/2017: EF 45%, severe AI, mild to moderate AS, mild mitral valve stenosis s/p MV repair  In 7/19, she had TAVR with #23 Edwards Sapien 3 THV.  Echo post-op in 7/19 showed EF 30-35%, moderate LVH, bioprosthetic aortic valve looked ok, s/p MV repair with mild mitral stenosis.   She developed pain in her right leg and was found to have occluded right EIA on dopplers in 9/19, confirmed by CTA. Suspect this was a vascular access complication of TAVR.  She was seen by Dr. Arbie Cookey who recommended conservative treatment for the time being.  Echo in 2/20 showed that EF remains 30-35%, stable TAVR valve, s/p MVR repair with no regurgitation or significant stenosis.   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 There was concern for possible partial valve thrombosis and gated CT ordered to assess aortic valve.  Echo in 6/23 showed EF 30-35% with wall motion abnormalities, moderate RV dysfunction, s/p mitral valve repair with trivial MR and mean gradient 4 mmHg, bioprosthetic aortic valve s/p TAVR with mean gradient 49 mmHg and AVA 0.67 cm^2. 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 Patient was started on Eliquis.   Repeat gated CTA chest on apixaban in 11/23 showed minimal restriction to bioprosthetic aortic valve excursion with no significant thrombus, significant improvement.    Most recent echo was 1/24, showing EF up to 40-45% with apical hypokinesis, mild RV dysfunction, mild mitral stenosis s/p MV repair with mean gradient 6 mmHg, bioprosthetic aortic valve with mean gradient 13 mmHg (improved), normal IVC.   Today she returns for HF followup.  She has been doing well recently.  Weight is stable. No significant exertional dyspnea.  No chest pain.  No lightheadedness.  No BRBPR/melena. No significant claudication.   ECG (personally reviewed): NSR, inferolateral and anterolateral Qs  Labs (2/18): K 3.8 => 3.4, creatinine 1.09 => 1.07, hgb 12.7,  BNP 1010 Labs (3/18): K 4.6, creatinine 2.43 => 1.74 Labs (4/18): K 4, creatinine 1.28, digoxin 0.3 Labs 09/2016: K 4.4, creatinine 1.26.  Labs 01/19/2017: K 4.4, creatinine 1.21, digoxin 0.3 Labs (10/18): K 4.7, creatinine 1.10, digoxin 0.3 Labs (12/18): K 4.9, creatinine 1.34, digoxin < 0.2 Labs (8/19): K 3.9, creatinine 1.33 Labs (9/19): K 3.7, creatinine 1.46 Labs  (7/21): K 4.9, creatinine 1.38 Labs (8/22): K 3.8, creatinine 1.65, LDL 174 Labs (11/22): K 4.3, creatinine 1.21, LDL 30, LFTs ok Labs (2/23): K 4.3, creatinine 1.41 Labs (5/23): K 4.1, creatinine 1.49, BNP 315 Labs (7/23): LDL 45 Labs (11/23): K 4.3, creatinine 1.06 Labs (1/24): K 4.2, creatinine 1.3  Labs (4/24): K 3.4 Creatinine 0.95   PMH: 1. HTN 2. Hypothyroidism 3. Hyperlipidemia 4. Rheumatic heart disease: s/p MV repair and aortic valve repair in 2010, Dr. Cornelius Moras. - TEE (2/18): EF 35-30%, dilated LV, rheumatic-appearing aortic valve s/p repair with moderate AS (AVA 1.1 cm^2), moderate AI.  MV s/p repair with mild-moderate mitral stenosis and eccentric severe MR.   - Echo (8/18): Severe AI but only moderate AS, MR was only mild on this study (may have missed full jet) with mean MV gradient 4 mmHg.  - TEE 06/28/2017: EF 45%, severe AI, mild to moderate AS, mild mitral valve stenosis s/p MV repair - In 7/19, she had TAVR with #23 Edwards Sapien 3 THV.   - Echo post-op in 7/19 showed EF 30-35%, moderate LVH, bioprosthetic aortic valve looked ok, s/p MV repair with mild mitral stenosis.  - Echo in 11/22 showed TAVR valve with significant stenosis and regurgitation => mild-moderate PVL with mean gradient 29 mmHg and DI 0.22.  There was mild stenosis of the repaired mitral valve.  - Gated CTA chest (6/23) showed HALT/HAM involving all 3 leaflets of the bioprosthetic aortic valve with restriction to motion - Gated CTA chest (11/23, on apixaban): minimal restriction to bioprosthetic aortic valve excursion with no significant thrombus, significant improvement.   5. Chronic systolic CHF: Nonischemic cardiomyopathy, probably related to valvular disease.   - Echo (11/15): EF 40-45%, - Echo (12/16): EF 35-40%. - Echo (1/18): EF 25-30%.  - LHC/RHC (2/18): Normal coronaries, mean RA 18, mean PA 74/39 mean 52, mean PCWP 31, CI 1.98 Fick. - CPX (3/18): peak VO2 11.7, VE/VCO2 slope 40, RER 1.11 =>  moderate to severely decreased functional capacity due to HF.  - RHC (4/18): mean RA 4, PA 44/17 mean 28, mean PCWP 15, CI 1.73, PVR 4 WU.  - Echo (8/18): EF 30-35%, severe AI with moderate AS but only mild MR noted with mean MV gradient 4 mmHg.  The RV was mildly dilated with mildly decreased systolic function. - TEE 2/19: EF 45%, severe aortic insufficiency, mild to moderate AS, mild mitral valve stenosis s/p MV repair - Coronary CTA (5/19): No obstructive coronary disease.  - Echo (7/19): EF 30-35%, moderate LVH, bioprosthetic aortic valve looked ok, s/p MV repair with mild mitral stenosis.  - Echo (2/20): EF 30-35%, moderate LVH with diffuse hypokinesis, mildly decreased RV systolic function, bioprosthetic aortic valve s/p TAVR without significant stenosis or regurgitation, s/p MV repair with mean gradient 3 mmHg and no MR.  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  - Echo (6/23):  EF 30-35% with wall motion abnormalities, moderate RV  dysfunction, s/p mitral valve repair with trivial MR and mean gradient 4 mmHg, bioprosthetic aortic valve s/p TAVR with mean gradient 49 mmHg and AVA 0.67 cm^2.  - Echo (1/24): EF 40-45% with apical hypokinesis, mild RV dysfunction, mild mitral stenosis s/p MV repair with mean gradient 6 mmHg, bioprosthetic aortic valve with mean gradient 13 mmHg (improved), normal IVC.  6. Carotid dopplers (6/19): No significant disease.  7. PAD: 9/19 peripheral arterial dopplers showed total occluded proximal right external iliac artery.  - CTA confirmed total occlusion of R EIA with collaterals.  Suspect TAVR vascular access complication.  - Peripheral arterial dopplers (6/23): totally occluded right mid to distal R CFA. - ABIs (8/24): 0.73 right, 0.94 left.   Social  History   Socioeconomic History   Marital status: Single    Spouse name: Not on file   Number of children: Not on file   Years of education: Not on file   Highest education level: Not on file  Occupational History   Not on file  Tobacco Use   Smoking status: Former    Current packs/day: 0.00    Average packs/day: 0.1 packs/day for 15.0 years (1.5 ttl pk-yrs)    Types: Cigarettes    Start date: 70    Quit date: 2011    Years since quitting: 13.9   Smokeless tobacco: Never  Vaping Use   Vaping status: Never Used  Substance and Sexual Activity   Alcohol use: Never   Drug use: Never   Sexual activity: Not Currently  Other Topics Concern   Not on file  Social History Narrative   Unemployed, on disability.   Now is finally reestablished with a PCP.   She is a former smoker, quit in 2011. Denies alcohol consumption   Tries to walk routinely.   Social Determinants of Health   Financial Resource Strain: Not on file  Food Insecurity: Not on file  Transportation Needs: Unmet Transportation Needs (06/01/2022)   PRAPARE - Administrator, Civil Service (Medical): Yes    Lack of Transportation (Non-Medical): Yes  Physical Activity: Not on file  Stress: Not on file  Social Connections: Unknown (10/03/2021)   Received from [facility]   Social Network    Social Network: Not on file  Intimate Partner Violence: Unknown (08/25/2021)   Received from [facility], [facility]   HITS    Physically Hurt: Not on file    Insult or Talk Down To: Not on file    Threaten Physical Harm: Not on file    Scream or Curse: Not on file   Family History  Problem Relation Age of Onset   Heart disease Mother    CVA Mother    Review of systems complete and found to be negative unless listed in HPI.    Current Outpatient Medications  Medication Sig Dispense Refill   acetaminophen (TYLENOL) 325 MG tablet Take 650 mg every 6 (six) hours as needed by mouth for mild  pain.     albuterol (PROVENTIL HFA;VENTOLIN HFA) 108 (90 BASE) MCG/ACT inhaler Inhale 2 puffs into the lungs every 6 (six) hours as needed for shortness of breath.      apixaban (ELIQUIS) 5 MG TABS tablet Take 1 tablet (5 mg total) by mouth 2 (two) times daily. 60 tablet 11   aspirin 81 MG chewable tablet Chew 1 tablet (81 mg total) by mouth daily. 30 tablet 3   beclomethasone (QVAR) 40 MCG/ACT inhaler Inhale 2 puffs into  the lungs daily as needed (shortness of breath).     carvedilol (COREG) 12.5 MG tablet Take 1 tablet (12.5 mg total) by mouth 2 (two) times daily with a meal. 180 tablet 3   dapagliflozin propanediol (FARXIGA) 10 MG TABS tablet Take 1 tablet (10 mg total) by mouth daily before breakfast. 90 tablet 3   furosemide (LASIX) 20 MG tablet Take 1 tablet (20 mg total) by mouth daily. 30 tablet 11   levothyroxine (SYNTHROID) 125 MCG tablet Take 125 mcg by mouth daily before breakfast. Except sunday     potassium chloride SA (KLOR-CON M) 20 MEQ tablet Take 1 tablet (20 mEq total) by mouth daily. 90 tablet 3   rosuvastatin (CRESTOR) 20 MG tablet TAKE 1 TABLET(20 MG) BY MOUTH DAILY 30 tablet 11   sacubitril-valsartan (ENTRESTO) 49-51 MG Take 1 tablet by mouth 2 (two) times daily. 60 tablet 11   Semaglutide, 1 MG/DOSE, 4 MG/3ML SOPN Inject 1 mg into the skin once a week. 3 mL 0   spironolactone (ALDACTONE) 25 MG tablet TAKE 1 TABLET(25 MG) BY MOUTH DAILY 30 tablet 11   No current facility-administered medications for this visit.   There were no vitals taken for this visit.  Wt Readings from Last 3 Encounters:  01/25/23 87.5 kg (193 lb)  12/23/22 87.5 kg (193 lb)  12/22/22 87.1 kg (192 lb)   PHYSICAL EXAM: General: NAD Neck: No JVD, no thyromegaly or thyroid nodule.  Lungs: Clear to auscultation bilaterally with normal respiratory effort. CV: Nondisplaced PMI.  Heart regular S1/S2, no S3/S4, 1/6 SEM RUSBk.  No peripheral edema.  No carotid bruit.  Normal pedal pulses.  Abdomen: Soft,  nontender, no hepatosplenomegaly, no distention.  Skin: Intact without lesions or rashes.  Neurologic: Alert and oriented x 3.  Psych: Normal affect. Extremities: No clubbing or cyanosis.  HEENT: Normal.   Assessment/Plan: 1. 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  She had aortic and mitral valve repairs in 2010.   It was felt that she would not be a very good Mitraclip candidate as she already has a degree of mitral stenosis.  TEE in 2/18 showed moderate AS/moderate AI , mild to moderate MS, severe eccentric MR.  Echo in 8/18 showed moderate AS/severe AI but only mild mitral stenosis and mild MR noted. Hemodynamics looked better on 4/18 RHC though cardiac output still marginal.  Repeat TEE 06/28/2017: EF 45%, severe aortic insufficiency, mild to moderate AS, mild mitral valve stenosis s/p MV repair with trivial MR. She had TAVR in 7/19, feeling much better since then.  Echo 6/21 showed stable bioprosthetic aortic valve, s/p mitral valve repair without significant stenosis or regurgitation. Echo 11/22,? partial-valve thrombosis, elevated mean gradient 29 mm Hg, previously 22 mm HG 6/21. Echo in 6/23 showed mean gradient up to 49 mmHg with AVA 0.67 cm^2.  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  Eliquis was started. Repeat gated CTA chest in 11/23 showed no significant restriction to bioprosthetic aortic valve excursion with no significant thrombus => marked improvement.  Echo 1/24 showed mean gradient down to 13 mmHg across the aortic valve.  - Plan to continue Eliquis 5 mg bid long-term. No bleeding issues.  - Continue ASA 81 daily.  2. Chronic systolic CHF:  Nonischemic cardiomyopathy (no significant disease on 2/18 LHC).  It is possible that this cardiomyopathy is driven by her valvular disease. Echo (8/18) with  EF 30-35% RV mildly dilated. TEE 06/28/2017: EF 45%,  severe aortic insufficiency, mild to moderate AS, mild mitral valve stenosis s/p MV repair with only trivial MR. Post-TAVR echo showed EF down to 30-35%, and echo in 2/20 showed EF stable in 30-35% range.  This may reflect myocardial dysfunction from prior long-standing aortic insufficiency.  Echo 6/21 showed EF 40-45%. Echo (11/22): EF 35-40%. Echo in 6/23 showed EF 30-35%.  Echo 1/24 was improved with EF 40-45% with apical hypokinesis, mild RV dysfunction, mild mitral stenosis s/p MV repair with mean gradient 6 mmHg, bioprosthetic aortic valve with mean gradient 13 mmHg (improved), normal IVC.  NYHA II, not volume overloaded on exam.  Overall doing well. No BP room today to titrate meds.   - Continue lasix 20 mg daily. BMET today.  - Continue Farxiga 10 mg  - Continue Entresto 49/51 mg bid. - Continue spironolactone 25 mg daily.  - Continue Coreg 12.5 mg bid.  3. PAD: Occluded right EIA likely due to TAVR vascular access complication.  Occluded right CFA on last doppler study. Has seen [facility], plan for medical management.  Denies claudication    - Followup with [facility].   4. Hyperlipidemia: on Crestor  - Check lipids today.  5. Obesity: There is no height or weight on file to calculate BMI.   - Refer to pharmacy clinic for Ozempic or Mounjaro.    Follow up in 4 months with APP  Jacklynn Ganong,  04/17/2023

## 2023-04-24 ENCOUNTER — Encounter (HOSPITAL_COMMUNITY): Payer: Medicaid Other

## 2023-05-04 ENCOUNTER — Ambulatory Visit (HOSPITAL_COMMUNITY)
Admission: RE | Admit: 2023-05-04 | Discharge: 2023-05-04 | Disposition: A | Discharge status: Home / Self Care | Payer: Medicaid Other | Source: Ambulatory Visit | Attending: Family Medicine | Admitting: Family Medicine

## 2023-05-04 ENCOUNTER — Encounter (HOSPITAL_COMMUNITY): Payer: Self-pay

## 2023-05-04 VITALS — BP 96/62 | HR 81 | Wt 181.2 lb

## 2023-05-04 DIAGNOSIS — I428 Other cardiomyopathies: Secondary | ICD-10-CM | POA: Insufficient documentation

## 2023-05-04 DIAGNOSIS — Z953 Presence of xenogenic heart valve: Secondary | ICD-10-CM | POA: Diagnosis not present

## 2023-05-04 DIAGNOSIS — E669 Obesity, unspecified: Secondary | ICD-10-CM | POA: Insufficient documentation

## 2023-05-04 DIAGNOSIS — Z6839 Body mass index (BMI) 39.0-39.9, adult: Secondary | ICD-10-CM | POA: Insufficient documentation

## 2023-05-04 DIAGNOSIS — I110 Hypertensive heart disease with heart failure: Secondary | ICD-10-CM | POA: Insufficient documentation

## 2023-05-04 DIAGNOSIS — Z7901 Long term (current) use of anticoagulants: Secondary | ICD-10-CM | POA: Insufficient documentation

## 2023-05-04 DIAGNOSIS — Z79899 Other long term (current) drug therapy: Secondary | ICD-10-CM | POA: Diagnosis not present

## 2023-05-04 DIAGNOSIS — Z7982 Long term (current) use of aspirin: Secondary | ICD-10-CM | POA: Diagnosis not present

## 2023-05-04 DIAGNOSIS — I5022 Chronic systolic (congestive) heart failure: Secondary | ICD-10-CM

## 2023-05-04 DIAGNOSIS — I080 Rheumatic disorders of both mitral and aortic valves: Secondary | ICD-10-CM | POA: Insufficient documentation

## 2023-05-04 DIAGNOSIS — E785 Hyperlipidemia, unspecified: Secondary | ICD-10-CM | POA: Diagnosis not present

## 2023-05-04 DIAGNOSIS — I739 Peripheral vascular disease, unspecified: Secondary | ICD-10-CM

## 2023-05-04 DIAGNOSIS — Z5982 Transportation insecurity: Secondary | ICD-10-CM | POA: Diagnosis not present

## 2023-05-04 DIAGNOSIS — I509 Heart failure, unspecified: Secondary | ICD-10-CM

## 2023-05-04 DIAGNOSIS — Z7984 Long term (current) use of oral hypoglycemic drugs: Secondary | ICD-10-CM | POA: Insufficient documentation

## 2023-05-04 DIAGNOSIS — I38 Endocarditis, valve unspecified: Secondary | ICD-10-CM

## 2023-05-04 LAB — BASIC METABOLIC PANEL
Anion gap: 10 (ref 5–15)
BUN: 29 mg/dL — ABNORMAL HIGH (ref 8–23)
CO2: 24 mmol/L (ref 22–32)
Calcium: 11.8 mg/dL — ABNORMAL HIGH (ref 8.9–10.3)
Chloride: 99 mmol/L (ref 98–111)
Creatinine, Ser: 1.91 mg/dL — ABNORMAL HIGH (ref 0.44–1.00)
GFR, Estimated: 29 mL/min — ABNORMAL LOW (ref >60)
Glucose, Bld: 104 mg/dL — ABNORMAL HIGH (ref 70–99)
Potassium: 4.8 mmol/L (ref 3.5–5.1)
Sodium: 133 mmol/L — ABNORMAL LOW (ref 135–145)

## 2023-05-04 LAB — BRAIN NATRIURETIC PEPTIDE: B Natriuretic Peptide: 87.8 pg/mL (ref 0.0–100.0)

## 2023-05-04 NOTE — Progress Notes (Signed)
Advanced Heart Failure Clinic Note   PCP: Patient, No Pcp Per Cardiology: Dr. Herbie Baltimore HF Cardiology: Dr. Shirlee Latch  62 y.o. 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  She had aortic and mitral repairs in 2010.  Afterwards, EF was mildly low in the 40-45% range.  However, EF had dropped to 25-30% on 1/18 echo.  TEE showed severe MR, moderate AI, moderate aortic stenosis.  RHC/LHC in 2/18 showed significantly elevated LV and RV filling pressures.    At her initial appointment here, she was very dyspneic with exertion, with orthopnea and PND. Her meds were adjusted and Lasix increased.  She has seen Dr. Cornelius Moras since that time for evaluation for redo valve surgery.  She would require high risk mitral and aortic valve replacements. Plan was to follow closely w/ Dr. Cornelius Moras for monitoring for surgery if further decline/ frequent CHF exacerbations. CPX was done in 3/18 showing moderate to severely decreased functional capacity.  RHC in 4/18 showed optimized filling pressures but low cardiac output.  Repeat echo in 8/18 showed EF 30-35%, severe AI with moderate AS but only mild MR noted.  The RV was mildly dilated with mildly decreased systolic function.   TEE 06/28/2017: EF 45%, severe AI, mild to moderate AS, mild mitral valve stenosis s/p MV repair  In 7/19, she had TAVR with #23 Edwards Sapien 3 THV.  Echo post-op in 7/19 showed EF 30-35%, moderate LVH, bioprosthetic aortic valve looked ok, s/p MV repair with mild mitral stenosis.   She developed pain in her right leg and was found to have occluded right EIA on dopplers in 9/19, confirmed by CTA. Suspect this was a vascular access complication of TAVR.  She was seen by Dr. Arbie Cookey who recommended conservative treatment for the time being.  Echo in 2/20 showed that EF remains 30-35%, stable TAVR valve, s/p MVR repair with no regurgitation or significant stenosis.   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 There was concern for possible partial valve thrombosis and gated CT ordered to assess aortic valve.  Echo in 6/23 showed EF 30-35% with wall motion abnormalities, moderate RV dysfunction, s/p mitral valve repair with trivial MR and mean gradient 4 mmHg, bioprosthetic aortic valve s/p TAVR with mean gradient 49 mmHg and AVA 0.67 cm^2. 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 Patient was started on Eliquis.   Repeat gated CTA chest on apixaban in 11/23 showed minimal restriction to bioprosthetic aortic valve excursion with no significant thrombus, significant improvement.    Most recent echo was 1/24, showing EF up to 40-45% with apical hypokinesis, mild RV dysfunction, mild mitral stenosis s/p MV repair with mean gradient 6 mmHg, bioprosthetic aortic valve with mean gradient 13 mmHg (improved), normal IVC.   Today she returns for HF follow up. Overall feeling well. Denies increasing SOB, CP, dizziness, edema, or PND/Orthopnea. Appetite ok. No fever or chills. Has lost about 20 lbs since starting Ozempic. No claudications. Taking all medications.   Labs (2/18): K 3.8 => 3.4, creatinine 1.09 => 1.07, hgb 12.7, BNP 1010 Labs (3/18): K 4.6,  creatinine 2.43 => 1.74 Labs (4/18): K 4, creatinine 1.28, digoxin 0.3 Labs 09/2016: K 4.4, creatinine 1.26.  Labs 01/19/2017: K 4.4, creatinine 1.21, digoxin 0.3 Labs (10/18): K 4.7, creatinine 1.10, digoxin 0.3 Labs (12/18): K 4.9, creatinine 1.34, digoxin < 0.2 Labs (8/19): K 3.9, creatinine 1.33 Labs (9/19): K 3.7, creatinine 1.46 Labs (7/21): K 4.9, creatinine 1.38 Labs  (8/22): K 3.8, creatinine 1.65, LDL 174 Labs (11/22): K 4.3, creatinine 1.21, LDL 30, LFTs ok Labs (2/23): K 4.3, creatinine 1.41 Labs (5/23): K 4.1, creatinine 1.49, BNP 315 Labs (7/23): LDL 45 Labs (11/23): K 4.3, creatinine 1.06 Labs (1/24): K 4.2, creatinine 1.3  Labs (4/24): K 3.4 Creatinine 0.95  Labs (8/24): K 4.4, creatinine 1.39  PMH: 1. HTN 2. Hypothyroidism 3. Hyperlipidemia 4. Rheumatic heart disease: s/p MV repair and aortic valve repair in 2010, Dr. Cornelius Moras. - TEE (2/18): EF 35-30%, dilated LV, rheumatic-appearing aortic valve s/p repair with moderate AS (AVA 1.1 cm^2), moderate AI.  MV s/p repair with mild-moderate mitral stenosis and eccentric severe MR.   - Echo (8/18): Severe AI but only moderate AS, MR was only mild on this study (may have missed full jet) with mean MV gradient 4 mmHg.  - TEE 06/28/2017: EF 45%, severe AI, mild to moderate AS, mild mitral valve stenosis s/p MV repair - In 7/19, she had TAVR with #23 Edwards Sapien 3 THV.   - Echo post-op in 7/19 showed EF 30-35%, moderate LVH, bioprosthetic aortic valve looked ok, s/p MV repair with mild mitral stenosis.  - Echo in 11/22 showed TAVR valve with significant stenosis and regurgitation => mild-moderate PVL with mean gradient 29 mmHg and DI 0.22.  There was mild stenosis of the repaired mitral valve.  - Gated CTA chest (6/23) showed HALT/HAM involving all 3 leaflets of the bioprosthetic aortic valve with restriction to motion - Gated CTA chest (11/23, on apixaban): minimal restriction to bioprosthetic aortic valve excursion with no significant thrombus, significant improvement.   5. Chronic systolic CHF: Nonischemic cardiomyopathy, probably related to valvular disease.   - Echo (11/15): EF 40-45%, - Echo (12/16): EF 35-40%. - Echo (1/18): EF 25-30%.  - LHC/RHC (2/18): Normal coronaries, mean RA 18, mean PA 74/39 mean 52, mean PCWP 31, CI 1.98 Fick. - CPX (3/18): peak VO2 11.7, VE/VCO2 slope 40, RER 1.11 =>  moderate to severely decreased functional capacity due to HF.  - RHC (4/18): mean RA 4, PA 44/17 mean 28, mean PCWP 15, CI 1.73, PVR 4 WU.  - Echo (8/18): EF 30-35%, severe AI with moderate AS but only mild MR noted with mean MV gradient 4 mmHg.  The RV was mildly dilated with mildly decreased systolic function. - TEE 2/19: EF 45%, severe aortic insufficiency, mild to moderate AS, mild mitral valve stenosis s/p MV repair - Coronary CTA (5/19): No obstructive coronary disease.  - Echo (7/19): EF 30-35%, moderate LVH, bioprosthetic aortic valve looked ok, s/p MV repair with mild mitral stenosis.  - Echo (2/20): EF 30-35%, moderate LVH with diffuse hypokinesis, mildly decreased RV systolic function, bioprosthetic aortic valve s/p TAVR without significant stenosis or regurgitation, s/p MV repair with mean gradient 3 mmHg and no MR.  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  - Echo (6/23):  EF 30-35% with wall motion abnormalities, moderate RV  dysfunction, s/p mitral valve repair with trivial MR and mean gradient 4 mmHg, bioprosthetic aortic valve s/p TAVR with mean gradient 49 mmHg and AVA 0.67 cm^2.  - Echo (1/24): EF 40-45% with apical hypokinesis, mild RV dysfunction, mild mitral stenosis s/p MV repair with mean gradient 6 mmHg, bioprosthetic aortic valve with mean gradient 13 mmHg (improved), normal IVC.  6. Carotid dopplers (6/19): No significant disease.  7. PAD: 9/19 peripheral arterial dopplers showed total occluded proximal right external iliac artery.  - CTA confirmed total occlusion of R EIA with collaterals.  Suspect TAVR vascular access complication.  - Peripheral arterial dopplers (6/23): totally occluded right mid to distal R CFA. - ABIs (8/24): 0.73 right, 0.94 left.   Social  History   Socioeconomic History   Marital status: Single    Spouse name: Not on file   Number of children: Not on file   Years of education: Not on file   Highest education level: Not on file  Occupational History   Not on file  Tobacco Use   Smoking status: Former    Current packs/day: 0.00    Average packs/day: 0.1 packs/day for 15.0 years (1.5 ttl pk-yrs)    Types: Cigarettes    Start date: 18    Quit date: 2011    Years since quitting: 13.9   Smokeless tobacco: Never  Vaping Use   Vaping status: Never Used  Substance and Sexual Activity   Alcohol use: Never   Drug use: Never   Sexual activity: Not Currently  Other Topics Concern   Not on file  Social History Narrative   Unemployed, on disability.   Now is finally reestablished with a PCP.   She is a former smoker, quit in 2011. Denies alcohol consumption   Tries to walk routinely.   Social Drivers of Corporate investment banker Strain: Not on file  Food Insecurity: Not on file  Transportation Needs: Unmet Transportation Needs (06/01/2022)   PRAPARE - Administrator, Civil Service (Medical): Yes    Lack of Transportation (Non-Medical): Yes  Physical Activity: Not on file  Stress: Not on file  Social Connections: Unknown (10/03/2021)   Received from [facility]   Social Network    Social Network: Not on file  Intimate Partner Violence: Unknown (08/25/2021)   Received [facility], [facility]   HITS    Physically Hurt: Not on file    Insult or Talk Down To: Not on file    Threaten Physical Harm: Not on file    Scream or Curse: Not on file   Family History  Problem Relation Age of Onset   Heart disease Mother    CVA Mother    Review of systems complete and found to be negative unless listed in HPI.    Current Outpatient Medications  Medication Sig Dispense Refill   acetaminophen (TYLENOL) 325 MG tablet Take 650 mg every 6 (six) hours as needed by mouth for mild pain.      albuterol (PROVENTIL HFA;VENTOLIN HFA) 108 (90 BASE) MCG/ACT inhaler Inhale 2 puffs into the lungs every 6 (six) hours as needed for shortness of breath.      apixaban (ELIQUIS) 5 MG TABS tablet Take 1 tablet (5 mg total) by mouth 2 (two) times daily. 60 tablet 11   aspirin 81 MG chewable tablet Chew 1 tablet (81 mg total) by mouth daily. 30 tablet 3   beclomethasone (QVAR) 40 MCG/ACT inhaler Inhale 2 puffs into  the lungs daily as needed (shortness of breath).     carvedilol (COREG) 12.5 MG tablet Take 1 tablet (12.5 mg total) by mouth 2 (two) times daily with a meal. 180 tablet 3   dapagliflozin propanediol (FARXIGA) 10 MG TABS tablet Take 1 tablet (10 mg total) by mouth daily before breakfast. 90 tablet 3   furosemide (LASIX) 20 MG tablet Take 1 tablet (20 mg total) by mouth daily. 30 tablet 11   levothyroxine (SYNTHROID) 125 MCG tablet Take 100 mcg by mouth daily before breakfast. Except sunday     potassium chloride SA (KLOR-CON M) 20 MEQ tablet Take 1 tablet (20 mEq total) by mouth daily. 90 tablet 3   rosuvastatin (CRESTOR) 20 MG tablet TAKE 1 TABLET(20 MG) BY MOUTH DAILY 30 tablet 11   sacubitril-valsartan (ENTRESTO) 49-51 MG Take 1 tablet by mouth 2 (two) times daily. 60 tablet 11   Semaglutide, 1 MG/DOSE, 4 MG/3ML SOPN Inject 1 mg into the skin once a week. (Patient taking differently: Inject 1 mg into the skin once a week. Mondays) 3 mL 0   spironolactone (ALDACTONE) 25 MG tablet TAKE 1 TABLET(25 MG) BY MOUTH DAILY 30 tablet 11   No current facility-administered medications for this encounter.   BP 96/62   Pulse 81   Wt 82.2 kg (181 lb 3.2 oz)   SpO2 93%   BMI 39.21 kg/m   Wt Readings from Last 3 Encounters:  05/04/23 82.2 kg (181 lb 3.2 oz)  01/25/23 87.5 kg (193 lb)  12/23/22 87.5 kg (193 lb)   PHYSICAL EXAM:  General: Well appearing. No distress on RA. Walked into clinic HEENT: neck supple.   Cardiac: JVP not visible. S1 and prominent S2 present. No murmurs or  rub. Resp: Lung sounds clear and equal B/L, diminished bases Abdomen: Obese, soft, non-tender, non-distended. + BS. Extremities: Warm and dry. No rash, cyanosis.  No edema.  Neuro: Alert and oriented x3. Affect pleasant. Moves all extremities without difficulty.  Assessment/Plan: 1. 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  She had aortic and mitral valve repairs in 2010.   It was felt that she would not be a very good Mitraclip candidate as she already has a degree of mitral stenosis.  TEE in 2/18 showed moderate AS/moderate AI , mild to moderate MS, severe eccentric MR.  Echo in 8/18 showed moderate AS/severe AI but only mild mitral stenosis and mild MR noted. Hemodynamics looked better on 4/18 RHC though cardiac output still marginal.  Repeat TEE 06/28/2017: EF 45%, severe aortic insufficiency, mild to moderate AS, mild mitral valve stenosis s/p MV repair with trivial MR. She had TAVR in 7/19, feeling much better since then.  Echo 6/21 showed stable bioprosthetic aortic valve, s/p mitral valve repair without significant stenosis or regurgitation. Echo 11/22,? partial-valve thrombosis, elevated mean gradient 29 mm Hg, previously 22 mm HG 6/21. Echo in 6/23 showed mean gradient up to 49 mmHg with AVA 0.67 cm^2.  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  Eliquis was started. Repeat gated CTA chest in 11/23 showed no significant restriction to bioprosthetic aortic valve excursion with no significant thrombus => marked improvement.  Echo 1/24 showed mean gradient down to 13 mmHg across the aortic valve.  - Continue Eliquis 5 mg bid long-term. No bleeding issues.  - Continue ASA 81 daily.  - No echo in 1 year. Repeat for surveillance. 2.  Chronic systolic CHF:  Nonischemic cardiomyopathy (no significant disease on 2/18 LHC).  It is possible that this cardiomyopathy is driven  by her valvular disease. Echo (8/18) with EF 30-35% RV mildly dilated. TEE 06/28/2017: EF 45%, severe aortic insufficiency, mild to moderate AS, mild mitral valve stenosis s/p MV repair with only trivial MR. Post-TAVR echo showed EF down to 30-35%, and echo in 2/20 showed EF stable in 30-35% range.  This may reflect myocardial dysfunction from prior long-standing aortic insufficiency.  Echo 6/21 showed EF 40-45%. Echo (11/22): EF 35-40%. Echo in 6/23 showed EF 30-35%.  Echo 1/24 was improved with EF 40-45% with apical hypokinesis, mild RV dysfunction, mild mitral stenosis s/p MV repair with mean gradient 6 mmHg, bioprosthetic aortic valve with mean gradient 13 mmHg (improved), normal IVC.  NYHA I-II. Volume status okay.  Overall doing well. No BP room today to titrate meds.   - Continue lasix 20 mg daily. KCL 20 daily. BMET today. - Continue Farxiga 10 mg  - Continue Entresto 49/51 mg bid. - Continue spironolactone 25 mg daily.  - Continue Coreg 12.5 mg bid.  3. PAD: Occluded right EIA likely due to TAVR vascular access complication.  Occluded right CFA on last doppler study. Has seen [facility], plan for medical management. Denies claudication  - Vascular following. No change in current management unless devlopes pain at rest or nonhealing ulcer. 4. Hyperlipidemia: on Crestor  - LDL 35 (8/24) 5. Obesity: Body mass index is 39.21 kg/m. - Now on Ozempic has lost ~20lbs. Congratulated.  Follow up in 6 months with Dr. McLean  [place] Annalynn Centanni, NP 05/04/2023

## 2023-05-04 NOTE — Patient Instructions (Signed)
Routine lab work today. Will notify you of abnormal results  Your provider requests you have an echocardiogram  Follow up with Dr.McLean in 6 months  Do the following things EVERYDAY: Weigh yourself in the morning before breakfast. Write it down and keep it in a log. Take your medicines as prescribed Eat low salt foods--Limit salt (sodium) to 2000 mg per day.  Stay as active as you can everyday Limit all fluids for the day to less than 2 liters

## 2023-05-05 ENCOUNTER — Telehealth (HOSPITAL_COMMUNITY): Payer: Self-pay | Admitting: Cardiology

## 2023-05-05 DIAGNOSIS — I509 Heart failure, unspecified: Secondary | ICD-10-CM

## 2023-05-05 DIAGNOSIS — I38 Endocarditis, valve unspecified: Secondary | ICD-10-CM

## 2023-05-05 NOTE — Telephone Encounter (Signed)
Patient called.  Patient aware.  

## 2023-05-05 NOTE — Telephone Encounter (Signed)
-----   Message from [place] Lee sent at 05/04/2023  4:29 PM EST ----- Fluid marker low and kidney function elevated. Suspect dehydration. Stop Lasix and KCL daily. Repeat labs in 7days.

## 2023-05-10 ENCOUNTER — Ambulatory Visit (HOSPITAL_COMMUNITY)
Admission: RE | Admit: 2023-05-10 | Discharge: 2023-05-10 | Disposition: A | Discharge status: Home / Self Care | Payer: Medicaid Other | Source: Ambulatory Visit | Attending: Cardiology | Admitting: Cardiology

## 2023-05-10 DIAGNOSIS — I5022 Chronic systolic (congestive) heart failure: Secondary | ICD-10-CM | POA: Insufficient documentation

## 2023-05-10 DIAGNOSIS — I38 Endocarditis, valve unspecified: Secondary | ICD-10-CM | POA: Insufficient documentation

## 2023-05-10 LAB — BASIC METABOLIC PANEL
Anion gap: 6 (ref 5–15)
BUN: 19 mg/dL (ref 8–23)
CO2: 22 mmol/L (ref 22–32)
Calcium: 9.2 mg/dL (ref 8.9–10.3)
Chloride: 107 mmol/L (ref 98–111)
Creatinine, Ser: 1.71 mg/dL — ABNORMAL HIGH (ref 0.44–1.00)
GFR, Estimated: 34 mL/min — ABNORMAL LOW (ref >60)
Glucose, Bld: 99 mg/dL (ref 70–99)
Potassium: 4.5 mmol/L (ref 3.5–5.1)
Sodium: 135 mmol/L (ref 135–145)

## 2023-05-10 LAB — BRAIN NATRIURETIC PEPTIDE: B Natriuretic Peptide: 125.1 pg/mL — ABNORMAL HIGH (ref 0.0–100.0)

## 2023-05-12 ENCOUNTER — Telehealth: Payer: Self-pay | Admitting: Pharmacist

## 2023-05-12 MED ORDER — OZEMPIC (2 MG/DOSE) 8 MG/3ML ~~LOC~~ SOPN: 2.0000 mg | PEN_INJECTOR | SUBCUTANEOUS | 6 refills | Status: DC

## 2023-05-12 NOTE — Telephone Encounter (Signed)
Tolerating 1 mg well,still has 2 more doses. 179 lbs current weight and baseline weight was 199 lbs lost almost 20 lbs. Ready to move on to 2 mg once week Ozempic dose in 2 weeks. Prescription with 6 refills sent to patient's preferred pharmacy.

## 2023-05-22 ENCOUNTER — Other Ambulatory Visit (HOSPITAL_COMMUNITY): Payer: Self-pay

## 2023-05-22 DIAGNOSIS — I38 Endocarditis, valve unspecified: Secondary | ICD-10-CM

## 2023-05-22 MED ORDER — CARVEDILOL 12.5 MG PO TABS
12.5000 mg | ORAL_TABLET | Freq: Two times a day (BID) | ORAL | 3 refills | Status: DC
Start: 2023-05-22 — End: 2023-06-19

## 2023-05-30 ENCOUNTER — Other Ambulatory Visit (HOSPITAL_COMMUNITY): Payer: Self-pay | Admitting: *Deleted

## 2023-05-30 MED ORDER — DAPAGLIFLOZIN PROPANEDIOL 10 MG PO TABS: 10.0000 mg | ORAL_TABLET | Freq: Every day | ORAL | 3 refills | Status: DC

## 2023-06-06 ENCOUNTER — Ambulatory Visit (HOSPITAL_COMMUNITY)
Admission: RE | Admit: 2023-06-06 | Discharge: 2023-06-06 | Disposition: A | Discharge status: Home / Self Care | Payer: Medicaid Other | Source: Ambulatory Visit | Attending: Cardiology

## 2023-06-06 DIAGNOSIS — I110 Hypertensive heart disease with heart failure: Secondary | ICD-10-CM | POA: Diagnosis present

## 2023-06-06 DIAGNOSIS — I509 Heart failure, unspecified: Secondary | ICD-10-CM | POA: Diagnosis not present

## 2023-06-06 DIAGNOSIS — I429 Cardiomyopathy, unspecified: Secondary | ICD-10-CM | POA: Insufficient documentation

## 2023-06-06 LAB — ECHOCARDIOGRAM COMPLETE
AR max vel: 0.81 cm2
AV Area VTI: 0.81 cm2
AV Area mean vel: 0.76 cm2
AV Mean grad: 12.6 mm[Hg]
AV Peak grad: 21.6 mm[Hg]
Ao pk vel: 2.32 m/s
Area-P 1/2: 2.91 cm2
Calc EF: 35.6 %
MV VTI: 0.7 cm2
S' Lateral: 4.4 cm
Single Plane A2C EF: 33.9 %
Single Plane A4C EF: 34.6 %

## 2023-06-06 MED ORDER — PERFLUTREN LIPID MICROSPHERE
1.0000 mL | INTRAVENOUS | Status: AC | PRN
  Administered 2023-06-06: 1 mL via INTRAVENOUS

## 2023-06-09 ENCOUNTER — Other Ambulatory Visit: Payer: Self-pay

## 2023-06-09 ENCOUNTER — Encounter (HOSPITAL_COMMUNITY): Payer: Self-pay

## 2023-06-09 ENCOUNTER — Inpatient Hospital Stay (HOSPITAL_COMMUNITY)
Admission: EM | Admit: 2023-06-09 | Discharge: 2023-06-17 | DRG: 064 | Disposition: A | Discharge status: Home / Self Care | Payer: Medicaid Other | Attending: Family Medicine | Admitting: Family Medicine

## 2023-06-09 ENCOUNTER — Emergency Department (HOSPITAL_COMMUNITY): Payer: Medicaid Other

## 2023-06-09 DIAGNOSIS — I130 Hypertensive heart and chronic kidney disease with heart failure and stage 1 through stage 4 chronic kidney disease, or unspecified chronic kidney disease: Secondary | ICD-10-CM | POA: Diagnosis present

## 2023-06-09 DIAGNOSIS — R4189 Other symptoms and signs involving cognitive functions and awareness: Secondary | ICD-10-CM | POA: Diagnosis present

## 2023-06-09 DIAGNOSIS — E66813 Obesity, class 3: Secondary | ICD-10-CM | POA: Diagnosis present

## 2023-06-09 DIAGNOSIS — R339 Retention of urine, unspecified: Secondary | ICD-10-CM | POA: Diagnosis not present

## 2023-06-09 DIAGNOSIS — R29711 NIHSS score 11: Secondary | ICD-10-CM | POA: Diagnosis present

## 2023-06-09 DIAGNOSIS — Z9889 Other specified postprocedural states: Secondary | ICD-10-CM | POA: Diagnosis not present

## 2023-06-09 DIAGNOSIS — I5043 Acute on chronic combined systolic (congestive) and diastolic (congestive) heart failure: Secondary | ICD-10-CM | POA: Diagnosis not present

## 2023-06-09 DIAGNOSIS — I959 Hypotension, unspecified: Secondary | ICD-10-CM | POA: Insufficient documentation

## 2023-06-09 DIAGNOSIS — Z8249 Family history of ischemic heart disease and other diseases of the circulatory system: Secondary | ICD-10-CM

## 2023-06-09 DIAGNOSIS — G8191 Hemiplegia, unspecified affecting right dominant side: Secondary | ICD-10-CM | POA: Diagnosis present

## 2023-06-09 DIAGNOSIS — Z953 Presence of xenogenic heart valve: Secondary | ICD-10-CM | POA: Diagnosis not present

## 2023-06-09 DIAGNOSIS — Z7982 Long term (current) use of aspirin: Secondary | ICD-10-CM

## 2023-06-09 DIAGNOSIS — E8729 Other acidosis: Secondary | ICD-10-CM | POA: Diagnosis not present

## 2023-06-09 DIAGNOSIS — J9602 Acute respiratory failure with hypercapnia: Secondary | ICD-10-CM | POA: Diagnosis not present

## 2023-06-09 DIAGNOSIS — N179 Acute kidney failure, unspecified: Secondary | ICD-10-CM | POA: Diagnosis not present

## 2023-06-09 DIAGNOSIS — Z8673 Personal history of transient ischemic attack (TIA), and cerebral infarction without residual deficits: Secondary | ICD-10-CM

## 2023-06-09 DIAGNOSIS — R29810 Facial weakness: Secondary | ICD-10-CM | POA: Diagnosis present

## 2023-06-09 DIAGNOSIS — R34 Anuria and oliguria: Secondary | ICD-10-CM | POA: Diagnosis not present

## 2023-06-09 DIAGNOSIS — G9389 Other specified disorders of brain: Secondary | ICD-10-CM | POA: Diagnosis present

## 2023-06-09 DIAGNOSIS — I493 Ventricular premature depolarization: Secondary | ICD-10-CM | POA: Diagnosis not present

## 2023-06-09 DIAGNOSIS — Z87891 Personal history of nicotine dependence: Secondary | ICD-10-CM

## 2023-06-09 DIAGNOSIS — J101 Influenza due to other identified influenza virus with other respiratory manifestations: Secondary | ICD-10-CM | POA: Diagnosis present

## 2023-06-09 DIAGNOSIS — Z823 Family history of stroke: Secondary | ICD-10-CM

## 2023-06-09 DIAGNOSIS — R414 Neurologic neglect syndrome: Secondary | ICD-10-CM | POA: Diagnosis present

## 2023-06-09 DIAGNOSIS — I63412 Cerebral infarction due to embolism of left middle cerebral artery: Secondary | ICD-10-CM | POA: Diagnosis present

## 2023-06-09 DIAGNOSIS — Z7951 Long term (current) use of inhaled steroids: Secondary | ICD-10-CM

## 2023-06-09 DIAGNOSIS — I5022 Chronic systolic (congestive) heart failure: Secondary | ICD-10-CM | POA: Insufficient documentation

## 2023-06-09 DIAGNOSIS — I272 Pulmonary hypertension, unspecified: Secondary | ICD-10-CM | POA: Diagnosis present

## 2023-06-09 DIAGNOSIS — H53461 Homonymous bilateral field defects, right side: Secondary | ICD-10-CM | POA: Diagnosis present

## 2023-06-09 DIAGNOSIS — I639 Cerebral infarction, unspecified: Secondary | ICD-10-CM | POA: Diagnosis present

## 2023-06-09 DIAGNOSIS — F0150 Vascular dementia without behavioral disturbance: Secondary | ICD-10-CM | POA: Diagnosis present

## 2023-06-09 DIAGNOSIS — I428 Other cardiomyopathies: Secondary | ICD-10-CM | POA: Diagnosis present

## 2023-06-09 DIAGNOSIS — I5042 Chronic combined systolic (congestive) and diastolic (congestive) heart failure: Secondary | ICD-10-CM | POA: Diagnosis present

## 2023-06-09 DIAGNOSIS — Z79890 Hormone replacement therapy (postmenopausal): Secondary | ICD-10-CM

## 2023-06-09 DIAGNOSIS — N183 Chronic kidney disease, stage 3 unspecified: Secondary | ICD-10-CM | POA: Diagnosis not present

## 2023-06-09 DIAGNOSIS — J9801 Acute bronchospasm: Secondary | ICD-10-CM | POA: Diagnosis not present

## 2023-06-09 DIAGNOSIS — R270 Ataxia, unspecified: Secondary | ICD-10-CM | POA: Diagnosis present

## 2023-06-09 DIAGNOSIS — Z85850 Personal history of malignant neoplasm of thyroid: Secondary | ICD-10-CM

## 2023-06-09 DIAGNOSIS — N1831 Chronic kidney disease, stage 3a: Secondary | ICD-10-CM | POA: Diagnosis not present

## 2023-06-09 DIAGNOSIS — J9601 Acute respiratory failure with hypoxia: Secondary | ICD-10-CM | POA: Diagnosis not present

## 2023-06-09 DIAGNOSIS — Z1152 Encounter for screening for COVID-19: Secondary | ICD-10-CM

## 2023-06-09 DIAGNOSIS — Z7901 Long term (current) use of anticoagulants: Secondary | ICD-10-CM

## 2023-06-09 DIAGNOSIS — E876 Hypokalemia: Secondary | ICD-10-CM | POA: Diagnosis present

## 2023-06-09 DIAGNOSIS — Z885 Allergy status to narcotic agent status: Secondary | ICD-10-CM

## 2023-06-09 DIAGNOSIS — E785 Hyperlipidemia, unspecified: Secondary | ICD-10-CM | POA: Diagnosis present

## 2023-06-09 DIAGNOSIS — E890 Postprocedural hypothyroidism: Secondary | ICD-10-CM | POA: Diagnosis present

## 2023-06-09 DIAGNOSIS — I062 Rheumatic aortic stenosis with insufficiency: Secondary | ICD-10-CM | POA: Diagnosis not present

## 2023-06-09 DIAGNOSIS — Z79899 Other long term (current) drug therapy: Secondary | ICD-10-CM

## 2023-06-09 DIAGNOSIS — I6389 Other cerebral infarction: Secondary | ICD-10-CM | POA: Diagnosis not present

## 2023-06-09 DIAGNOSIS — R609 Edema, unspecified: Secondary | ICD-10-CM | POA: Diagnosis not present

## 2023-06-09 DIAGNOSIS — I6310 Cerebral infarction due to embolism of unspecified precerebral artery: Principal | ICD-10-CM

## 2023-06-09 DIAGNOSIS — Z8719 Personal history of other diseases of the digestive system: Secondary | ICD-10-CM

## 2023-06-09 DIAGNOSIS — N189 Chronic kidney disease, unspecified: Secondary | ICD-10-CM | POA: Diagnosis not present

## 2023-06-09 DIAGNOSIS — I9581 Postprocedural hypotension: Secondary | ICD-10-CM | POA: Diagnosis not present

## 2023-06-09 DIAGNOSIS — N1832 Chronic kidney disease, stage 3b: Secondary | ICD-10-CM | POA: Diagnosis present

## 2023-06-09 DIAGNOSIS — Z952 Presence of prosthetic heart valve: Secondary | ICD-10-CM | POA: Diagnosis not present

## 2023-06-09 DIAGNOSIS — E1122 Type 2 diabetes mellitus with diabetic chronic kidney disease: Secondary | ICD-10-CM | POA: Diagnosis present

## 2023-06-09 DIAGNOSIS — Z7984 Long term (current) use of oral hypoglycemic drugs: Secondary | ICD-10-CM

## 2023-06-09 DIAGNOSIS — I951 Orthostatic hypotension: Secondary | ICD-10-CM | POA: Diagnosis not present

## 2023-06-09 DIAGNOSIS — Z6838 Body mass index (BMI) 38.0-38.9, adult: Secondary | ICD-10-CM

## 2023-06-09 DIAGNOSIS — Z7985 Long-term (current) use of injectable non-insulin antidiabetic drugs: Secondary | ICD-10-CM

## 2023-06-09 LAB — DIFFERENTIAL
Abs Immature Granulocytes: 0 10*3/uL (ref 0.00–0.07)
Basophils Absolute: 0 10*3/uL (ref 0.0–0.1)
Basophils Relative: 1 %
Eosinophils Absolute: 0 10*3/uL (ref 0.0–0.5)
Eosinophils Relative: 1 %
Immature Granulocytes: 0 %
Lymphocytes Relative: 32 %
Lymphs Abs: 1.7 10*3/uL (ref 0.7–4.0)
Monocytes Absolute: 0.4 10*3/uL (ref 0.1–1.0)
Monocytes Relative: 7 %
Neutro Abs: 3.2 10*3/uL (ref 1.7–7.7)
Neutrophils Relative %: 59 %
Smear Review: DECREASED

## 2023-06-09 LAB — CBC
HCT: 42.5 % (ref 36.0–46.0)
Hemoglobin: 13.6 g/dL (ref 12.0–15.0)
MCH: 28.8 pg (ref 26.0–34.0)
MCHC: 32 g/dL (ref 30.0–36.0)
MCV: 90 fL (ref 80.0–100.0)
Platelets: 83 10*3/uL — ABNORMAL LOW (ref 150–400)
RBC: 4.72 MIL/uL (ref 3.87–5.11)
RDW: 15 % (ref 11.5–15.5)
WBC: 5.2 10*3/uL (ref 4.0–10.5)
nRBC: 0 % (ref 0.0–0.2)

## 2023-06-09 LAB — COMPREHENSIVE METABOLIC PANEL
ALT: 15 U/L (ref 0–44)
AST: 21 U/L (ref 15–41)
Albumin: 3.2 g/dL — ABNORMAL LOW (ref 3.5–5.0)
Alkaline Phosphatase: 70 U/L (ref 38–126)
Anion gap: 9 (ref 5–15)
BUN: 12 mg/dL (ref 8–23)
CO2: 20 mmol/L — ABNORMAL LOW (ref 22–32)
Calcium: 8.1 mg/dL — ABNORMAL LOW (ref 8.9–10.3)
Chloride: 107 mmol/L (ref 98–111)
Creatinine, Ser: 1.66 mg/dL — ABNORMAL HIGH (ref 0.44–1.00)
GFR, Estimated: 35 mL/min — ABNORMAL LOW (ref >60)
Glucose, Bld: 102 mg/dL — ABNORMAL HIGH (ref 70–99)
Potassium: 3.3 mmol/L — ABNORMAL LOW (ref 3.5–5.1)
Sodium: 136 mmol/L (ref 135–145)
Total Bilirubin: 0.7 mg/dL (ref 0.0–1.2)
Total Protein: 6.3 g/dL — ABNORMAL LOW (ref 6.5–8.1)

## 2023-06-09 LAB — I-STAT CHEM 8, ED
BUN: 12 mg/dL (ref 8–23)
Calcium, Ion: 1 mmol/L — ABNORMAL LOW (ref 1.15–1.40)
Chloride: 105 mmol/L (ref 98–111)
Creatinine, Ser: 1.7 mg/dL — ABNORMAL HIGH (ref 0.44–1.00)
Glucose, Bld: 96 mg/dL (ref 70–99)
HCT: 41 % (ref 36.0–46.0)
Hemoglobin: 13.9 g/dL (ref 12.0–15.0)
Potassium: 3.3 mmol/L — ABNORMAL LOW (ref 3.5–5.1)
Sodium: 140 mmol/L (ref 135–145)
TCO2: 22 mmol/L (ref 22–32)

## 2023-06-09 LAB — RESP PANEL BY RT-PCR (RSV, FLU A&B, COVID)  RVPGX2
Influenza A by PCR: POSITIVE — AB
Influenza B by PCR: NEGATIVE
Resp Syncytial Virus by PCR: NEGATIVE
SARS Coronavirus 2 by RT PCR: NEGATIVE

## 2023-06-09 LAB — APTT: aPTT: 33 s (ref 24–36)

## 2023-06-09 LAB — CBG MONITORING, ED: Glucose-Capillary: 96 mg/dL (ref 70–99)

## 2023-06-09 LAB — PROTIME-INR
INR: 1.6 — ABNORMAL HIGH (ref 0.8–1.2)
Prothrombin Time: 18.8 s — ABNORMAL HIGH (ref 11.4–15.2)

## 2023-06-09 LAB — ETHANOL: Alcohol, Ethyl (B): <10 mg/dL (ref <10)

## 2023-06-09 MED ORDER — SODIUM CHLORIDE 0.9% FLUSH
3.0000 mL | Freq: Once | INTRAVENOUS | Status: AC
  Administered 2023-06-09: 3 mL via INTRAVENOUS

## 2023-06-09 MED ORDER — ACETAMINOPHEN 650 MG RE SUPP: 650.0000 mg | Freq: Four times a day (QID) | RECTAL | Status: DC | PRN

## 2023-06-09 MED ORDER — ROSUVASTATIN CALCIUM 20 MG PO TABS
20.0000 mg | ORAL_TABLET | Freq: Every day | ORAL | Status: DC
  Filled 2023-06-09: qty 1

## 2023-06-09 MED ORDER — LEVOTHYROXINE SODIUM 100 MCG/5ML IV SOLN: 25.0000 ug | Freq: Every day | INTRAVENOUS | Status: DC

## 2023-06-09 MED ORDER — LEVOTHYROXINE SODIUM 100 MCG PO TABS: 100.0000 ug | ORAL_TABLET | Freq: Every day | ORAL | Status: DC

## 2023-06-09 MED ORDER — ASPIRIN 300 MG RE SUPP: 300.0000 mg | Freq: Every day | RECTAL | Status: DC

## 2023-06-09 MED ORDER — ASPIRIN 81 MG PO CHEW
81.0000 mg | CHEWABLE_TABLET | Freq: Every day | ORAL | Status: DC
  Filled 2023-06-09: qty 1

## 2023-06-09 MED ORDER — APIXABAN 5 MG PO TABS
5.0000 mg | ORAL_TABLET | Freq: Two times a day (BID) | ORAL | Status: DC
  Filled 2023-06-09: qty 1

## 2023-06-09 MED ORDER — IOHEXOL 350 MG/ML SOLN
100.0000 mL | Freq: Once | INTRAVENOUS | Status: AC | PRN
  Administered 2023-06-09: 100 mL via INTRAVENOUS

## 2023-06-09 MED ORDER — POLYETHYLENE GLYCOL 3350 17 G PO PACK: 17.0000 g | PACK | Freq: Every day | ORAL | Status: DC | PRN

## 2023-06-09 MED ORDER — ACETAMINOPHEN 325 MG PO TABS
650.0000 mg | ORAL_TABLET | Freq: Four times a day (QID) | ORAL | Status: DC | PRN
  Filled 2023-06-09: qty 2

## 2023-06-09 NOTE — ED Notes (Signed)
Hopitalist at bedside

## 2023-06-09 NOTE — Plan of Care (Addendum)
Spoke with Dr. Amada Jupiter via secure chat - since recent echo similar to previous and known valvular replacements recommended considering TEE and agreed with this.  Spoke with [facility] with cardiology who states could do TEE on Monday or Tuesday. She will make patient NPO 1/20 in case of Monday TEE.

## 2023-06-09 NOTE — Code Documentation (Signed)
Stroke Response Nurse Documentation Code Documentation  Secret Cradle is a 63 y.o. female arriving to [facility]  via [facility] on 1/17 with past medical hx of cardiomyopathy, DM, CHF, TIA, CVA. On Eliquis (apixaban) daily. Code stroke was activated by EMS.   Patient from home where she was LKW at 0015 prior to going to bed last night and now complaining of R sided weakness, sensory deficit, and facial droop. These symptoms were discovered when she got out of bed this morning and fell due to the weakness.    Stroke team at the bedside on patient arrival. Labs drawn and patient cleared for CT by Dr. Jeraldine Loots. Patient to CT with team. NIHSS 11, see documentation for details and code stroke times. Patient with left gaze preference , right hemianopia, right facial droop, right arm weakness, right leg weakness, right decreased sensation, and dysarthria  on exam. The following imaging was completed:  CT Head, CTA, and CTP. Patient is not a candidate for IV Thrombolytic due to being out of the treatment window. Patient is not not a candidate for IR due to completed stroke on CTP per MD.   Care Plan: q2 NIHSS and vitals.   Bedside handoff with ED RN Topher.    Pearlie Oyster  Stroke Response RN

## 2023-06-09 NOTE — Assessment & Plan Note (Addendum)
Sudden right sided weakness this AM.  Last well-known at 12:00 AM, patient is outside of the window for IV TNK.  CT negative for acute intracranial hemorrhage but revealed minimal hyperdensity seen in the left insular M2 concerning for possible thrombus, and encephalomalacia in the L parietal and occipital lobes. CTA demonstrated focal embolus within the L inferior division of M2 branch with diminished distal perfusion and tortuous common carotid arteries bilaterally.  Embolus likely from rheumatic heart valvular disease. - Admit to FMTS, attending Dr. Lum Babe - Progressive, Vital signs per floor - Permissive hypertension - MRI of brain w/o contrast - Recent echo performed, will reach out for cardiology for potential TEE with known valvular abnormalities - ASA 300 mg rectally - holding home eliquis/heparin drip due to size of stroke per neurology - Cardiac monitoring - Neuro checks q4h - Fall precautions - Delirium precautions - PT/OT/SLP to treat - NPO, failed bedside swallow - AM BMP and risk stratification labs: A1c, TSH, Lipid panel, and HIV - Neurology on board, appreciate recommendations

## 2023-06-09 NOTE — Progress Notes (Addendum)
PHARMACY - ANTICOAGULATION CONSULT NOTE  Pharmacy Consult for Heparin Indication:  bioprosthetic valve - TAVR and MVR  Allergies  Allergen Reactions   Morphine And Codeine Other (See Comments)    Hallucinations     Patient Measurements: Height: 4\' 9"  (144.8 cm) Weight: 81 kg (178 lb 9.2 oz) IBW/kg (Calculated) : 38.6 Heparin Dosing Weight: 58.1kg  Vital Signs: Temp: 97.8 F (36.6 C) (01/17 1547) Temp Source: Oral (01/17 1547) BP: 124/63 (01/17 1136) Pulse Rate: 73 (01/17 1136)  Labs: Recent Labs    06/09/23 1053 06/09/23 1154  HGB 13.6 13.9  HCT 42.5 41.0  PLT 83*  --   APTT 33  --   LABPROT 18.8*  --   INR 1.6*  --   CREATININE 1.66* 1.70*    Estimated Creatinine Clearance: 30.5 mL/min (A) (by C-G formula based on SCr of 1.7 mg/dL (H)).   Medical History: Past Medical History:  Diagnosis Date   Chronic combined systolic and diastolic CHF (congestive heart failure) (HCC)    EF now down to 25-30%. LVEDP on cath was 30 mmHg with wedge pressure of 31 mmHg.   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  mean PA 74/39 mean 52, mean PCWP 31 (with V wave of 45 mmHg), LVEDP ~ 30 mmHg.   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 Progressive worsening of aortic and mitral valve disease.    Medications:  Scheduled:   apixaban  5 mg Oral BID   aspirin  81 mg Oral Daily   aspirin  300 mg Rectal Daily   [START ON 06/10/2023] levothyroxine  100 mcg Oral QAC breakfast   [START ON 06/10/2023] levothyroxine  25 mcg Intravenous Daily   rosuvastatin  20 mg Oral Daily   Infusions:   Assessment: 62YOF with PMH of rheumatic heart disease s/p bioprosthetic MVR and TAVR on Eliquis who presents with R sided weakness. CT revealed hyperdensity - did not receive TNK. Last eliquis dose 1/16 PM. Patient is currently NPO, therefore pharmacy has been consulted to dose heparin. Will utilize lower heparin dosing and goals due to presence of stroke on admission.  Hgb 13.9, plt 83 - platelets were 107 in August 2024.   Goal of Therapy:  Heparin level 0.3-0.5 units/ml aPTT 66-85 seconds Monitor platelets by anticoagulation protocol: Yes   Plan:  Start heparin infusion at 800 units/hr Check aPTT and anti-Xa level in 6 hours and daily while on heparin Continue to monitor H&H and platelets  Jenita Seashore 06/09/2023,4:45 PM  ADDENDUM: Spoke with neurology prior to entering orders. Instructed to not start anticoagulation at this time due to size of stroke. Will discontinue heparin consult.  Nicole Kindred, PharmD PGY1 Pharmacy Resident 06/09/2023 5:08 PM

## 2023-06-09 NOTE — Plan of Care (Signed)
  Problem: Education: Goal: Knowledge of General Education information will improve Description: Including pain rating scale, medication(s)/side effects and non-pharmacologic comfort measures Outcome: Progressing   Problem: Clinical Measurements: Goal: Ability to maintain clinical measurements within normal limits will improve Outcome: Progressing Goal: Will remain free from infection Outcome: Progressing   

## 2023-06-09 NOTE — ED Triage Notes (Signed)
PT BIB [facility] from home after daughter found PT lying on the floor in the bathroom around 1000 this morning. LKW 0015 today. PT aox4, R sided deficits with limbs and gaze. Denies dizziness or headache. [facility] vitals 140/112 BP, 76 HR 99% spO2 Room air.

## 2023-06-09 NOTE — ED Notes (Signed)
Alarmed for PVC pair, informed RN Topher, alarm silenced

## 2023-06-09 NOTE — ED Provider Notes (Signed)
EMERGENCY DEPARTMENT AT [facility] Provider Note   CSN: [id number] Arrival date & time: 06/09/23  1051  An emergency department physician performed an initial assessment on this suspected stroke patient at 52.  History  Chief Complaint  Patient presents with   Code Stroke    [name] is a 63 y.o. female with medical history of rheumatic heart valve disease, status post mitral valve repair, status post aortic valve repair, hypothyroid, pulmonary hypertension, valvular cardiomyopathy, acute on chronic combined systolic and diastolic CHF, history of GI bleeds, CKD, hypertension, TIA, diabetes.  Patient presents to ED for evaluation of right-sided weakness.  Patient reports that last night around 10 PM her 12 AM she got up to use the restroom.  Patient reports at this time she collapsed to the floor, was unable to use the right side of her body.  Patient arrives to the ED as a code stroke with neurology, Dr. Amada Jupiter, at the bedside.  Patient taken to CT scanner.  Patient found to have focal embolus in the left inferior division of the M2 branch with diminished distal perfusion.  These findings were called to Dr. Amada Jupiter.  Dr. Luisa Hart has requested the patient be admitted to the hospitalist service for stroke workup.  Patient denies any chest pain, shortness of breath.  She is endorsing right-sided weakness, numbness.  HPI     Home Medications Prior to Admission medications   Medication Sig Start Date End Date Taking? Authorizing Provider  acetaminophen (TYLENOL) 325 MG tablet Take 325 mg by mouth daily.   Yes [provider]  albuterol (PROVENTIL HFA;VENTOLIN HFA) 108 (90 BASE) MCG/ACT inhaler Inhale 2 puffs into the lungs every 6 (six) hours as needed for shortness of breath.    Yes [provider]  apixaban (ELIQUIS) 5 MG TABS tablet Take 1 tablet (5 mg total) by mouth 2 (two) times daily. 12/14/22  Yes Laurey Morale, MD  aspirin 81  MG chewable tablet Chew 1 tablet (81 mg total) by mouth daily. 01/10/17  Yes Graciella Freer, PA-C  beclomethasone (QVAR) 40 MCG/ACT inhaler Inhale 2 puffs into the lungs daily as needed (shortness of breath).   Yes [provider]  carvedilol (COREG) 12.5 MG tablet Take 1 tablet (12.5 mg total) by mouth 2 (two) times daily with a meal. 05/22/23  Yes Laurey Morale, MD  dapagliflozin propanediol (FARXIGA) 10 MG TABS tablet Take 1 tablet (10 mg total) by mouth daily before breakfast. 05/30/23  Yes Laurey Morale, MD  furosemide (LASIX) 20 MG tablet Take 20 mg by mouth daily. 05/31/23  Yes [provider]  levothyroxine (SYNTHROID) 100 MCG tablet Take 100 mcg by mouth daily before breakfast.   Yes [provider]  sacubitril-valsartan (ENTRESTO) 49-51 MG Take 1 tablet by mouth 2 (two) times daily. 08/10/22  Yes Laurey Morale, MD  Semaglutide, 2 MG/DOSE, (OZEMPIC, 2 MG/DOSE,) 8 MG/3ML SOPN Inject 2 mg into the skin once a week. 05/12/23  Yes Laurey Morale, MD  spironolactone (ALDACTONE) 25 MG tablet TAKE 1 TABLET(25 MG) BY MOUTH DAILY 12/16/22  Yes Laurey Morale, MD  potassium chloride (KLOR-CON) 10 MEQ tablet Take 10 mEq by mouth 2 (two) times daily. Patient not taking: Reported on 06/09/2023    [provider]  rosuvastatin (CRESTOR) 20 MG tablet TAKE 1 TABLET(20 MG) BY MOUTH DAILY Patient not taking: Reported on 06/09/2023 09/15/22   Laurey Morale, MD      Allergies  Morphine and codeine    Review of Systems   Review of Systems  Unable to perform ROS: Acuity of condition (Level 5 caveat)  Neurological:  Positive for weakness and numbness.  All other systems reviewed and are negative.   Physical Exam Updated Vital Signs BP 124/63   Pulse 73   Temp 97.6 F (36.4 C) (Oral)   Resp 13   Ht 4\' 9"  (1.448 m)   Wt 81 kg   SpO2 98%   BMI 38.64 kg/m  Physical Exam Vitals and nursing note reviewed.  Constitutional:      General: She is  not in acute distress.    Appearance: She is well-developed.  HENT:     Head: Normocephalic and atraumatic.  Eyes:     Conjunctiva/sclera: Conjunctivae normal.  Cardiovascular:     Rate and Rhythm: Normal rate and regular rhythm.     Heart sounds: No murmur heard. Pulmonary:     Effort: Pulmonary effort is normal. No respiratory distress.     Breath sounds: Normal breath sounds.  Abdominal:     Palpations: Abdomen is soft.     Tenderness: There is no abdominal tenderness.  Musculoskeletal:        General: No swelling.     Cervical back: Neck supple.  Skin:    General: Skin is warm and dry.     Capillary Refill: Capillary refill takes less than 2 seconds.  Neurological:     Mental Status: She is alert.     Cranial Nerves: Cranial nerve deficit and facial asymmetry present.     Sensory: Sensory deficit present.     Motor: Weakness present.     Comments: Right-sided hemineglect.  Right upper extremity weakness, patient unable to track across the midline to the right.  5 out of 5 strength to bilateral lower extremities, sensation intact.  Numbness noted to patient right upper extremity.  Right-sided facial droop noted.  Psychiatric:        Mood and Affect: Mood normal.     ED Results / Procedures / Treatments   Labs (all labs ordered are listed, but only abnormal results are displayed) Labs Reviewed  PROTIME-INR - Abnormal; Notable for the following components:      Result Value   Prothrombin Time 18.8 (*)    INR 1.6 (*)    All other components within normal limits  CBC - Abnormal; Notable for the following components:   Platelets 83 (*)    All other components within normal limits  COMPREHENSIVE METABOLIC PANEL - Abnormal; Notable for the following components:   Potassium 3.3 (*)    CO2 20 (*)    Glucose, Bld 102 (*)    Creatinine, Ser 1.66 (*)    Calcium 8.1 (*)    Total Protein 6.3 (*)    Albumin 3.2 (*)    GFR, Estimated 35 (*)    All other components within  normal limits  I-STAT CHEM 8, ED - Abnormal; Notable for the following components:   Potassium 3.3 (*)    Creatinine, Ser 1.70 (*)    Calcium, Ion 1.00 (*)    All other components within normal limits  RESP PANEL BY RT-PCR (RSV, FLU A&B, COVID)  RVPGX2  APTT  DIFFERENTIAL  ETHANOL  CBG MONITORING, ED    EKG EKG Interpretation Date/Time:  Friday June 09 2023 11:37:17 EST Ventricular Rate:  74 PR Interval:  209 QRS Duration:  126 QT Interval:  450 QTC Calculation: 500 R Axis:  82  Text Interpretation: Sinus rhythm Multiple ventricular premature complexes Nonspecific intraventricular conduction delay Abnormal T, consider ischemia, diffuse leads Confirmed by Margarita Grizzle [id number]) on 06/09/2023 11:39:48 AM  Radiology CT ANGIO HEAD NECK W WO CM W PERF (CODE STROKE) Result Date: 06/09/2023 CLINICAL DATA:  Neuro deficit, acute, stroke suspected. Slurred speech. Facial droop. EXAM: CT ANGIOGRAPHY HEAD AND NECK CT PERFUSION BRAIN TECHNIQUE: Multidetector CT imaging of the head and neck was performed using the standard protocol during bolus administration of intravenous contrast. Multiplanar CT image reconstructions and MIPs were obtained to evaluate the vascular anatomy. Carotid stenosis measurements (when applicable) are obtained utilizing NASCET criteria, using the distal internal carotid diameter as the denominator. Multiphase CT imaging of the brain was performed following IV bolus contrast injection. Subsequent parametric perfusion maps were calculated using RAPID software. RADIATION DOSE REDUCTION: This exam was performed according to the departmental dose-optimization program which includes automated exposure control, adjustment of the mA and/or kV according to patient size and/or use of iterative reconstruction technique. CONTRAST:  OMNIPAQUE IOHEXOL 350 MG/ML SOLN COMPARISON:  None Available. Head CT immediately prior FINDINGS: CTA NECK FINDINGS Aortic arch: Normal appearance with  normal branching pattern. Right carotid system: Common carotid artery is tortuous but widely patent to the bifurcation. No bifurcation atherosclerotic disease. Cervical ICA widely patent. Left carotid system: Left common carotid artery widely patent but tortuous. No carotid bifurcation disease. Cervical ICA is normal. Vertebral arteries: Right vertebral artery takes an early origin from the proximal subclavian artery and is then widely patent through the cervical region to the foramen magnum. Left vertebral artery origin is normal and the vessel appears normal through the cervical region. Skeleton: Ordinary cervical spondylosis. Other neck: No mass or lymphadenopathy. Previous thyroidectomy. Upper chest: Mild scarring at the left lung apex. No apparent change since June of 2023. Review of the MIP images confirms the above findings CTA HEAD FINDINGS Anterior circulation: Both internal carotid arteries are widely patent through the skull base and siphon regions. Minimal siphon atherosclerotic calcification but no stenosis. Anterior cerebral arteries are patent and normal. Right middle cerebral artery is normal. Focal embolus within the left inferior division M2 branch with diminished distal perfusion. Posterior circulation: Both vertebral arteries widely patent through the foramen magnum to the basilar artery. Diminutive but widely patent basilar artery. Posterior circulation branch vessels are normal. Dominant anterior circulation supply of the right PCA. Venous sinuses: Patent and normal. Anatomic variants: None other significant. Review of the MIP images confirms the above findings CT Brain Perfusion Findings: ASPECTS: 10 CBF (<30%) Volume: 49mL Perfusion (Tmax>6.0s) volume: 53mL Mismatch Volume: 4mL Infarction Location:Left parietal lobe. IMPRESSION: 1. Focal embolus within the left inferior division M2 branch with diminished distal perfusion. 2. 49 mL of core infarct in the left parietal lobe. 53 mL of ischemic  penumbra. 4 mL of mismatch. 3. No other significant vascular finding. Tortuous common carotid arteries bilaterally. No carotid bifurcation disease. 4. Early origin of the right vertebral artery from the proximal subclavian artery. This is a normal anatomic variant. 5. These results were communicated to Dr. Amada Jupiter at 11:45 am on 06/09/2023 by text page via the [facility] messaging system. Electronically Signed   By: Paulina Fusi M.D.   On: 06/09/2023 11:45   CT HEAD CODE STROKE WO CONTRAST Result Date: 06/09/2023 CLINICAL DATA:  Code stroke. Slurred speech, right-sided weakness, facial droop EXAM: CT HEAD WITHOUT CONTRAST TECHNIQUE: Contiguous axial images were obtained from the base of the skull through the vertex without intravenous  contrast. RADIATION DOSE REDUCTION: This exam was performed according to the departmental dose-optimization program which includes automated exposure control, adjustment of the mA and/or kV according to patient size and/or use of iterative reconstruction technique. COMPARISON:  03/06/2012 CT head FINDINGS: Brain: No evidence of acute infarction, hemorrhage, mass, mass effect, or midline shift. No hydrocephalus or extra-axial collection. Redemonstrated encephalomalacia in the left parietal and occipital lobes. Advanced atrophy for age. Partial empty sella. Normal craniocervical junction. Vascular: Minimal hyperdensity in the left insular M2 (series 3, image 14). No other hyperdense vessel. Atherosclerotic calcifications in the intracranial carotid arteries. Skull: Negative for fracture or focal lesion. Sinuses/Orbits: Mild mucosal thickening in the paranasal sinuses. No acute finding in the orbits. Other: The mastoid air cells are well aerated. ASPECTS [facility] Stroke Program Early CT Score) - Ganglionic level infarction (caudate, lentiform nuclei, internal capsule, insula, M1-M3 cortex): 7 - Supraganglionic infarction (M4-M6 cortex): 3 Total score (0-10 with 10 being normal): 10  IMPRESSION: 1. No acute intracranial hemorrhage. ASPECTS is 10. 2. Minimal hyperdensity in the left insular M2, which could represent thrombus. 3. Redemonstrated encephalomalacia in the left parietal and occipital lobes. Imaging results were communicated on 06/09/2023 at 11:10 am to provider Dr. Amada Jupiter via secure text paging. Electronically Signed   By: Wiliam Ke M.D.   On: 06/09/2023 11:10    Procedures .Critical Care  Performed by: Al Decant, PA-C Authorized by: Al Decant, PA-C   Critical care provider statement:    Critical care time (minutes):  75   Critical care time was exclusive of:  Separately billable procedures and treating other patients and teaching time   Critical care was necessary to treat or prevent imminent or life-threatening deterioration of the following conditions:  CNS failure or compromise   Critical care was time spent personally by me on the following activities:  Blood draw for specimens, development of treatment plan with patient or surrogate, discussions with primary provider, discussions with consultants, evaluation of patient's response to treatment, examination of patient, interpretation of cardiac output measurements, obtaining history from patient or surrogate, ordering and performing treatments and interventions, ordering and review of laboratory studies, ordering and review of radiographic studies, pulse oximetry, re-evaluation of patient's condition and review of old charts   I assumed direction of critical care for this patient from another provider in my specialty: no     Care discussed with: admitting provider      Medications Ordered in ED Medications  sodium chloride flush (NS) 0.9 % injection 3 mL (3 mLs Intravenous Given 06/09/23 1301)  iohexol (OMNIPAQUE) 350 MG/ML injection 100 mL (100 mLs Intravenous Contrast Given 06/09/23 1128)    ED Course/ Medical Decision Making/ A&P Clinical Course as of 06/09/23 1330  Fri Jun 09, 2023  1132 Hospitalist admission for stroke work up [CG]  1135 RUE hemineglect. Can't track past midline to right [CG]  1142 BIBA. LKW last night around 10PM-12AM. Woke up around 12AM to use restroom, collapsed. Has RUE hemineglect, can't track past midline to right. Per [facility] Neurology, will need hospitalist admit for CVA work up [CG]  1300 Focal embolus within the left inferior division M2 branch with diminished distal perfusion.  49 mL of core infarct in the left parietal lobe. 53 mL of ischemic penumbra. 4 mL of mismatch. [CG]    Clinical Course User Index [CG] Al Decant, PA-C   Medical Decision Making Amount and/or Complexity of Data Reviewed Labs: ordered. Radiology: ordered.  Risk Decision regarding hospitalization.  63 year old female presents for evaluation.  Please see HPI for further details.  Patient arrives as code stroke to the ED.  Patient taken to CT scanner at this time.  Neurology, Dr. Amada Jupiter, at the patient bedside.  Patient initial CT scan unremarkable however CTA of head shows a focal embolus with the left inferior division of her M2 branch with diminished distal perfusion.  Patient has right upper extremity weakness, right-sided hemineglect, unable to track across the midline to the right.  Dr. Luisa Hart has requested admission to hospitalist service for CVA workup.  No indication for IR at this time.  Patient last known well last night around 10 PM or 12 AM.  Patient CBC without leukocytosis or anemia.  Ethanol undetectable.  CBG 96.  PT/INR elevated.  CMP shows potassium 3.3, bicarb 20, creatinine 1.66 has baseline for this patient.  At this time the patient has been admitted to the family medicine teaching service for further management and care.  Admitted to Dr. Lum Babe.   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

## 2023-06-09 NOTE — Consult Note (Signed)
NEUROLOGY CONSULT NOTE   Date of service: June 09, 2023 Patient Name: [name] MRN:  [id number] DOB:  [birth year] Chief Complaint: "Right-sided weakness" Requesting Provider: No att. providers found  History of Present Illness  [name] is a 63 y.o. 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   She was in her normal state of health when she went to bed yesterday evening.  Around 1230 she got up to go to the bathroom, and while she was doing this she collapsed.  This morning, she was brought in by EMS as a code stroke and was evaluated and taken emergently for head CT which demonstrated some chronic ischemic changes in the occipital lobe.  LKW: 12:30 AM Modified rankin score: 2-Slight disability-UNABLE to perform all activities but does not need assistance IV Thrombolysis: No, outside of window EVT:   NIHSS components Score: Comment  1a Level of Conscious 0[x]  1[]  2[]  3[]      1b LOC Questions 0[x]  1[]  2[]       1c LOC Commands 0[x]  1[]  2[]       2 Best Gaze 0[]  1[]  2[x]       3 Visual 0[]  1[]  2[x]  3[]      4 Facial Palsy 0[]  1[]  2[x]  3[]      5a Motor Arm - left 0[x]  1[]  2[]  3[]  4[]  UN[]    5b Motor Arm - Right 0[]  1[x]  2[]  3[]   4[]  UN[]    6a Motor Leg - Left 0[x]  1[]  2[]  3[]  4[]  UN[]    6b Motor Leg - Right 0[]  1[x]  2[]  3[]  4[]  UN[]    7 Limb Ataxia 0[x]  1[]  2[]  3[]  UN[]     8 Sensory 0[]  1[]  2[x]  UN[]      9 Best Language 0[x]  1[]  2[]  3[]      10 Dysarthria 0[]  1[x]  2[]  UN[]      11 Extinct. and Inattention 0[x]  1[]  2[]       TOTAL: 11       Past History   Past Medical History:  Diagnosis Date   Chronic combined systolic and diastolic CHF (congestive heart failure) (HCC)    EF now down to 25-30%. LVEDP on cath was 30 mmHg with wedge pressure of 31 mmHg.   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  mean PA 74/39 mean 52, mean PCWP 31 (with V wave of 45 mmHg), LVEDP ~ 30 mmHg.   RAD (reactive airway disease)  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   August 2010: a/p AoV & MV repair for severe MR and moderate AI - Dr. Cornelius Moras  Echo 12/2010: EF 40-45%, inferior hypokinesis; Slow progression of valvular Dz & drop in EF --> 05/2016: EF 25-30% (down from 40-45% post-op) with Mod AS/AI, mild MS & Severe MR (Cath & TEE 06/2016)    S/P aortic valve repair 12/23/2008   suture plication of 3 commissures   S/P MVR (mitral valve repair) 12/23/2008   26 mm Sorin MEMO 3D Ring Annuloplasty   S/P TAVR (transcatheter aortic valve replacement) 11/21/2017   23 mm Edwards Sapien 3 transcatheter heart valve placed via percutaneous right transfemoral approach    Valvular cardiomyopathy (HCC)     EF progressively worsened from 40-45% down to 25-30% by February 2018. Progressive worsening of aortic and mitral valve disease.    Past Surgical History:  Procedure Laterality Date   AORTIC VALVE REPAIR  August 2010   Suture plication of all 3 commissures   CARDIAC CATHETERIZATION  August 2010   Preop: nonobstructive coronary disease   CESAREAN SECTION  1981; 1996   MITRAL VALVE REPAIR  August 2010    26 mm Sorin [facility] 3D Ring Annuloplasty   RIGHT HEART CATH N/A 08/30/2016   Procedure: Right Heart Cath;  Surgeon: Laurey Morale, MD;  Location: [facility] INVASIVE CV LAB;  Service: Cardiovascular;  Laterality: N/A;   RIGHT/LEFT HEART CATH AND CORONARY ANGIOGRAPHY N/A 07/08/2016   Procedure: Right/Left Heart Cath and Coronary Angiography;  Surgeon: Marykay Lex, MD;  Location: [facility] INVASIVE CV LAB;  Service: Cardiovascular: Normal coronaries, mean RA 18, RV pressure/EDP: 70/12/18 mmHg.  mean PA 74/39 mean 52, mean PCWP 31 (with V wave of 45 mmHg), LVEDP ~ 30 mmHg.  CI 1.98 Fick. Peak AoV gradient ~15 mHg     TEE WITHOUT CARDIOVERSION N/A 07/13/2016   Procedure: Transesophageal Echocardiogram (TEE);  Surgeon: Chrystie Nose, MD;  Location: [facility] ENDOSCOPY;  Service: Cardiovascular:  EF 25-30%, dilated LV - diffuse hypokinesis, rheumatic-appearing Aortic Vallve s/p repair with moderate AS (AVA 1.1 cm^2), moderate central AI.  MV s/p repeat with mild mitral stenosis and eccentric severe MR.     TEE WITHOUT CARDIOVERSION     TEE WITHOUT CARDIOVERSION N/A 06/28/2017   Procedure: TRANSESOPHAGEAL ECHOCARDIOGRAM (TEE);  Surgeon: Laurey Morale, MD;  Location: [facility] ENDOSCOPY;  Service: Cardiovascular;  Laterality: N/A;   TEE WITHOUT CARDIOVERSION N/A 11/21/2017   Procedure: TRANSESOPHAGEAL ECHOCARDIOGRAM (TEE);  Surgeon: Tonny Bollman, MD;  Location: [facility] OR;  Service: Open Heart Surgery;  Laterality: N/A;   THYROID LOBECTOMY Left 09/17/2009   which showed a 3.5 cm follicular variant papillary cancer    THYROIDECTOMY     "2nd OR on my thyroid they took out the whole thing"   TRANSCATHETER AORTIC VALVE REPLACEMENT, TRANSFEMORAL  11/21/2017   Transcatheter Aortic Valve Replacement - Percutaneous Right Transfemoral Approach   TRANSCATHETER AORTIC VALVE REPLACEMENT, TRANSFEMORAL N/A 11/21/2017   Procedure: TRANSCATHETER AORTIC VALVE REPLACEMENT, TRANSFEMORAL using a 23mm Edwards Sapien 3 Aortic Valve;  Surgeon: Tonny Bollman, MD;  Location: [facility] OR;  Service: Open Heart Surgery;  Laterality: N/A;   TRANSTHORACIC ECHOCARDIOGRAM  05/2016   EF 25-30%. Moderate aortic stenosis with moderate regurgitation. Also potential aortic stenosis with likely worse than expected regurgitation   TRANSTHORACIC ECHOCARDIOGRAM  May 2012    EF 40-45%, inferior hypokinesis. Slightly increased transaortic velocity = mild AS with mild to moderate AI normal MV gradients with no MR. Significantly improved PA  pressures. Paradoxical septal motion.    Family History: Family History  Problem Relation Age of Onset   Heart disease Mother    CVA Mother     Social History  reports that she quit smoking about 14 years ago. Her smoking use included cigarettes. She started smoking about 29 years ago. She has a 1.5 pack-year smoking history. She has never used smokeless tobacco. She reports that she does not drink alcohol and does not use drugs.  Allergies  Allergen Reactions   Morphine And Codeine Other (See Comments)    Hallucinations     Medications   Current Facility-Administered Medications:    sodium chloride flush (NS) 0.9 % injection 3 mL, 3 mL, Intravenous, Once, Margarita Grizzle, MD  Current Outpatient Medications:    acetaminophen (TYLENOL) 325 MG tablet, Take 650 mg every 6 (six) hours as needed by mouth for mild pain., Disp: , Rfl:    albuterol (PROVENTIL HFA;VENTOLIN HFA) 108 (90 BASE) MCG/ACT inhaler, Inhale 2 puffs into the lungs every 6 (six) hours as needed for shortness of breath. , Disp: , Rfl:    apixaban  (ELIQUIS) 5 MG TABS tablet, Take 1 tablet (5 mg total) by mouth 2 (two) times daily., Disp: 60 tablet, Rfl: 11   aspirin 81 MG chewable tablet, Chew 1 tablet (81 mg total) by mouth daily., Disp: 30 tablet, Rfl: 3   beclomethasone (QVAR) 40 MCG/ACT inhaler, Inhale 2 puffs into the lungs daily as needed (shortness of breath)., Disp: , Rfl:    carvedilol (COREG) 12.5 MG tablet, Take 1 tablet (12.5 mg total) by mouth 2 (two) times daily with a meal., Disp: 180 tablet, Rfl: 3   dapagliflozin propanediol (FARXIGA) 10 MG TABS tablet, Take 1 tablet (10 mg total) by mouth daily before breakfast., Disp: 90 tablet, Rfl: 3   levothyroxine (SYNTHROID) 125 MCG tablet, Take 100 mcg by mouth daily before breakfast. Except sunday, Disp: , Rfl:    rosuvastatin (CRESTOR) 20 MG tablet, TAKE 1 TABLET(20 MG) BY MOUTH DAILY, Disp: 30 tablet, Rfl: 11   sacubitril-valsartan (ENTRESTO) 49-51 MG, Take 1 tablet by mouth 2 (two) times daily., Disp: 60 tablet, Rfl: 11   Semaglutide, 2 MG/DOSE, (OZEMPIC, 2 MG/DOSE,) 8 MG/3ML SOPN, Inject 2 mg into the skin once a week., Disp: 3 mL, Rfl: 6   spironolactone (ALDACTONE) 25 MG tablet, TAKE 1 TABLET(25 MG) BY MOUTH DAILY, Disp: 30 tablet, Rfl: 11  Vitals   Vitals:   06/09/23 1000  Weight: 81 kg    Body mass index is 38.64 kg/m.  Physical Exam   Constitutional: Appears well-developed and well-nourished.    Neurologic Examination    Neuro: Mental Status: Patient is awake, alert, oriented to person, place, month, age Patient is able to give a clear and coherent history. Cranial Nerves: II: Right hemianopia. Pupils are equal, round, and reactive to light.   III,IV, VI: Left gaze, does not cross the right V: Facial sensation is severely diminished in the right VII: Facial movement is weak on the right Motor: She has 3/5 weakness of the right upper extremity and 4/5 in the right lower extremity, good strength in the left Sensory: Sensation is severely diminished  throughout the right side Cerebellar: No ataxia on big nose finger on the left, unable to perform on the right       Labs/Imaging/Neurodiagnostic studies   CBC: No results for input(s): "WBC", "NEUTROABS", "HGB", "HCT", "MCV", "PLT" in the last 168 hours. Basic Metabolic Panel:  Lab Results  Component Value Date   NA 135 05/10/2023   K 4.5 05/10/2023   CO2 22 05/10/2023   GLUCOSE 99 05/10/2023   BUN 19 05/10/2023   CREATININE 1.71 (H) 05/10/2023   CALCIUM 9.2 05/10/2023   GFRNONAA 34 (L) 05/10/2023   GFRAA 49 (L) 01/23/2020   Lipid Panel:  Lab Results  Component Value Date   LDLCALC 35 12/23/2022   HgbA1c:  Lab Results  Component Value Date   HGBA1C 6.5 (H) 12/23/2022   Urine Drug Screen:     Component Value Date/Time   LABOPIA NONE DETECTED 12/10/2007 0454   COCAINSCRNUR NONE DETECTED 12/10/2007 0454   LABBENZ NONE DETECTED 12/10/2007 0454   AMPHETMU NONE DETECTED 12/10/2007 0454   THCU NONE DETECTED 12/10/2007 0454   LABBARB  12/10/2007 0454    NONE DETECTED        DRUG SCREEN FOR MEDICAL PURPOSES ONLY.  IF CONFIRMATION IS NEEDED FOR ANY PURPOSE, NOTIFY LAB WITHIN 5 DAYS.    Alcohol Level     Component Value Date/Time   [facility]  12/10/2007 0446    <5        LOWEST DETECTABLE LIMIT FOR SERUM ALCOHOL IS 11 mg/dL FOR MEDICAL PURPOSES ONLY   INR  Lab Results  Component Value Date   INR 1.09 11/17/2017   APTT  Lab Results  Component Value Date   APTT 33 11/17/2017   AED levels: No results found for: "PHENYTOIN", "ZONISAMIDE", "LAMOTRIGINE", "LEVETIRACETA"  CT Head without contrast(Personally reviewed): Old ischemic changes  CT angio Head and Neck with contrast(Personally reviewed): She has a fairly large area of perfusion deficit in the left parietal region, with corresponding infarct on CTP  ASSESSMENT   [name] is a 63 year old female with extensive past medical history on anticoagulation with Eliquis due to leaflet changes following  bioprosthetic aortic valve.  She presents with right-sided weakness and numbness which I suspect is an acute ischemic stroke.  She is not an IV TNK candidate  RECOMMENDATIONS  - HgbA1c, fasting lipid panel - MRI of the brain without contrast - Frequent neuro checks - Echocardiogram - Prophylactic therapy-Antiplatelet med: Aspirin - dose 81mg  daily for now - Risk factor modification - Telemetry monitoring - PT consult, OT consult, Speech consult - Stroke team to follow  ______________________________________________________________________    Signed, Ritta Slot, MD [facility]

## 2023-06-09 NOTE — Hospital Course (Addendum)
[name] is a 63 y.o.female with a history of rheumatic heart valve disease s/p MVR and TAVR, papillary thyroid carcinoma follicular variant (s/p thyroidectomy), hypothyroidism, pulmonary HTN, valvular cardiomyopathy/HF, HLD, CKD, HTN, TIA, T2DM, history of GIB who was admitted to the [facility] Teaching Service at [facility] for right sided weakness. Her hospital course is detailed below:  Stroke Presented to [facility] with right-sided weakness. Last well-known at 12:00 AM, patient outside of the window for IV TNK after arrival.  CT negative for acute intracranial hemorrhage but revealed minimal hyperdensity seen in the left insular M2 concerning for possible thrombus, and encephalomalacia in the L parietal and occipital lobes. CTA demonstrated focal embolus within the L inferior division of M2 branch with diminished distal perfusion and tortuous common carotid arteries bilaterally.  Embolus concerned to be from rheumatic heart valvular disease. Abbreviated TEE performed due to patient becoming hypoxic likely secondary to bronchospasm/laryngospasm and requiring intubation and short ICU admission. No clear source of embolism seen on TEE, otherwise LVEF 35-40%, trivial MVR and AVR. DVT US negative.  HFrEF  Rheumatic Heart Disease s/p TAVR, MVR On Entresto, spironolactone, Farxiga, Lasix, and Coreg at home. Marcelline Deist was the only medication that was continued inpatient 2/2 to her hypotension.  Hypotension Became hypotensive since being extubated. Pt was started on Midodrine by neurology. This was discontinued upon discussion with cardiology who recommended R heart cath if this persisted. Prior to discharge, BP stabilized ***.  Other chronic conditions were medically managed with home medications and formulary alternatives as necessary: CKD: Cr stable T2DM: On Ozempic weekly.  Held inpatient and monitored glucose with BMP. S/p TAVR and MVR: On Eliquis 5 mg BID; on hold due to size of stroke per neurology and switched  to Pradaxa prior to discharge. HLD: Crestor 20 mg daily Hypothyroidism s/p thyroidectomy d/t papillary thyroid cancer: Restarted home synthroid 1/18  PCP Follow-up Recommendations: Restart GDMT as able based on her pressures. Repeat BMP and CBC to ensure stability with restarting medications.

## 2023-06-09 NOTE — H&P (Addendum)
Hospital Admission History and Physical Service Pager: [phone]  Patient name: [name] Medical record number: [id number] Date of Birth: July 30, 1960 Age: 63 y.o. Gender: female  Primary Care Provider: Patient, No Pcp Per Consultants: Neurology Code Status: FULL Preferred Emergency Contact:  Contact Information     Name Relation Home Work Mobile          Robinson,Diondra Daughter   [phone]     Chief Complaint: Right-sided weakness  Assessment and Plan: Khushboo Nega is a 62 y.o. 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 Differential for presentation of this includes: Stroke -  which was confirmed by CTA most likely d/t her rheumatic heart valvular disease s/p TAVR and MVR, less likely due to endocarditis d/t drug use vs Afib (no hx). TIA - possible due to symptoms improving since initial exam although does have imaging findings Complex Migraine - unlikely due to no history of headache or migraines  Assessment & Plan Stroke [facility]) Sudden right sided weakness this AM.  Last well-known at 12:00 AM, patient is outside of the window for IV TNK.  CT negative for acute intracranial hemorrhage but revealed minimal hyperdensity seen in the left insular M2 concerning for possible thrombus, and encephalomalacia in the L parietal and occipital lobes. CTA demonstrated focal embolus within the L inferior division of M2 branch with diminished distal perfusion and tortuous common carotid arteries bilaterally.  Embolus likely from rheumatic heart valvular disease. - Admit to FMTS, attending Dr. Lum Babe - Progressive, Vital signs per floor - Permissive hypertension - MRI of brain w/o contrast - Recent echo performed, will reach out for  cardiology for potential TEE with known valvular abnormalities - ASA 300 mg rectally - holding home eliquis/heparin drip due to size of stroke per neurology - Cardiac monitoring - Neuro checks q4h - Fall precautions - Delirium precautions - PT/OT/SLP to treat - NPO, failed bedside swallow - AM BMP and risk stratification labs: A1c, TSH, Lipid panel, and HIV - Neurology on board, appreciate recommendations  Chronic and Stable Problems:  HFrEF  Rheumatic Heart Disease s/p TAVR, MVR: On Entresto, spironolactone, Farxiga, Lasix, and Coreg.  Currently holding all medications for permissive hypertension, elevated creatinine, NPO. Last EF 30-35%, trivial MVR with repair, AV normal without regurgitation CKD: Around baseline at 1.7 T2DM: On Ozempic weekly.  Held inpatient. Check glucose on AM labs, A1c. S/p TAVR and MVR: On Eliquis 5 mg BID on hold due to size of stroke per neurology HLD: home Crestor 20 mg daily on hold Hypothyroidism s/p thyroidectomy d/t papillary thyroid cancer: home Synthroid 100 mcg daily - hold IV as should be okay for up to a week per pharmacy an hopeful SLP pass tomorrow  FEN/GI: NPO pending SLP eval VTE Prophylaxis: SCDs  Disposition: Progressive  History of Present Illness:  [name] is a 63 y.o. female presenting with right-sided weakness.  She reports getting up to use the bathroom at around 10 AM today when she started feeling weak and collapsed to the floor.  Patient reports that she collapsed to the floor and was unable to use the right side of her body.  Daughter found her down at 10 AM.  Daughter noticed slurred speech and memory impairment. At that time was not talking or very alert. States she is starting to do better now.  Endorses right-sided weakness and numbness earlier which seems to be improving.   Denies headache, blurry vision, had the common cold last week, but this improved. Denies CP, SOB, abd pain, N/V/D, more so constipation, no changes in  urination or bowel movements.   In the ED, patient was seen by neurology.  CT scan unremarkable however CTA demonstrated a focal embolus with a left inferior division of her M2 branch with diminished distal perfusion.  Patient had RUE weakness, right-sided hemineglect, was unable to track across midline to the right.  CBC without leukocytosis or anemia.  PT/INR elevated.  CMP revealed hypokalemia 3.3, bicarb 20, creatinine 1.66.  Neurology recommended admission for stroke work up.   Review Of Systems: as above  Pertinent Past Medical History: Chronic combined systolic and diastolic CHF CKD HTN HLD Rheumatic heart valve disease s/p TAVR and MVR Papillary carcinoma, follicular variant neck Hypothyroidism Pulmonary HTN RAD History of GIB Dementia Remainder reviewed in history tab.   Pertinent Past Surgical History: R heart cath Thyroidectomy TAVR and MVR TEE Remainder reviewed in history tab.   Pertinent Social History: Tobacco use: Former, quit may years ago Alcohol use: None Other Substance use: None Lives with daughter  Pertinent Family History: Mother: Heart disease and CVA Father: Seizures Daughter: Seizures Remainder reviewed in history tab.   Important Outpatient Medications: Albuterol 2 puffs q6h PRN Beclomethasone 40 mcg/ACT inhaler 2 puffs daily PRN Eliquis 5 mg twice daily - for leaflet changes following bioprosthetic aortic valve ASA 81 mg daily Carvedilol 12.5 mg BID Farxiga 10 mg daily Lasix 20 mg daily Synthroid 100 mcg daily Klor-Con 10 meq BID (not in room) Crestor 20 mg daily Entresto 49-51 mg twice daily Ozempic 2 mg weekly Spironolactone 25 mg daily Remainder reviewed in medication history.   Objective: BP 124/63   Pulse 73   Temp 97.6 F (36.4 C) (Oral)   Resp 13   Ht 4\' 9"  (1.448 m)   Wt 81 kg   SpO2 98%   BMI 38.64 kg/m  Exam: General: Awake and alert in NAD HEENT: NCAT.  Sclera anicteric.  No rhinorrhea. Cardiovascular: RRR.   No M/R/G. Respiratory: CTAB.  Normal WOB on RA.  No wheezing, crackles, rhonchi, or diminished breath sounds. Gastrointestinal: Soft, nontender, nondistended.  Normoactive bowel sounds. MSK: No BLE edema.  Pedal pulses not palpable Derm: Warm and dry. Neuro: CN II: Pupils constricted.  Not reactive to light.  CN III, IV,VI: EOMI, improved from previous exam done by EDP and neurology where she only had left gaze and did not cross midline to the R CV V: Normal sensation in V1, V2, V3, improved from previous exam CVII: Symmetric smile and brow raise, facial droop on the R CN VIII: Normal hearing CN IX,X: Symmetric palate raise  CN XI: 5/5 shoulder shrug CN XII: Tongue protrudes towards the R UE and LE strength 4/5 on L, 3/5 on R Normal sensation in UE and LE bilaterally  No ataxia with finger to nose, normal heel to shin  Delayed and difficulty understanding instructions with special tests. Psych: Normal mood and affect  Labs:  CBC BMET  Recent Labs  Lab 06/09/23 1053 06/09/23 1154  WBC 5.2  --   HGB 13.6 13.9  HCT 42.5 41.0  PLT 83*  --    Recent Labs  Lab 06/09/23 1053 06/09/23 1154  NA 136 140  K 3.3* 3.3*  CL 107 105  CO2 20*  --   BUN 12 12  CREATININE 1.66* 1.70*  GLUCOSE 102* 96  CALCIUM 8.1*  --     PT/INR: 18.8/1.6 APTT: 33  EKG: Regular rate 74, NSR, normal axis, PVCs present, Qtc prolonged (500)  Imaging Studies Performed: CT Head Stroke w/o Contrast 1. No acute intracranial hemorrhage. ASPECTS is 10. 2. Minimal hyperdensity in the left insular M2, which could represent thrombus. 3. Redemonstrated encephalomalacia in the left parietal and occipital lobes.  CT Angio Head/Neck w/ and w/o CM w/ perfusion 1. Focal embolus within the left inferior division M2 branch with diminished distal perfusion. 2. 49 mL of core infarct in the left parietal lobe. 53 mL of ischemic penumbra. 4 mL of mismatch. 3. No other significant vascular finding. Tortuous common  carotid arteries bilaterally. No carotid bifurcation disease. 4. Early origin of the right vertebral artery from the proximal subclavian artery. This is a normal anatomic variant.  Fortunato Curling, DO 06/09/2023, 1:29 PM PGY-1, [facility] Family [facility]  FPTS Intern pager: [phone], text pages welcome Secure chat group [facility] Teaching Service   Upper Level Addendum:  I have seen and evaluated this patient along with Dr. Fatima Blank and reviewed the above note, making necessary revisions as appropriate.  I agree with the medical decision making and physical exam as noted above.  Levin Erp, MD PGY-3 [facility] Family Medicine Residency

## 2023-06-09 NOTE — Plan of Care (Signed)
FMTS Interim Progress Note  S: Seen at bedside, doing well. Needs to use the bathroom.   O: BP 99/66 (BP Location: Left Arm)   Pulse 79   Temp (!) 97.2 F (36.2 C) (Oral)   Resp (!) 21   Ht 4\' 9"  (1.448 m)   Wt 81 kg   SpO2 100%   BMI 38.64 kg/m   General: NAD, pleasant, able to participate in exam Respiratory: No respiratory distress Skin: warm and dry, no rashes noted Psych: Normal affect and mood   A/P: Stroke management per day team.  Concern for emboli from the heart, TEE in AM/NPO at [facility]   Alfredo Martinez, MD 06/09/2023, 9:23 PM PGY-3, [facility] pager [phone]

## 2023-06-10 ENCOUNTER — Inpatient Hospital Stay (HOSPITAL_COMMUNITY): Payer: Medicaid Other

## 2023-06-10 DIAGNOSIS — I493 Ventricular premature depolarization: Secondary | ICD-10-CM

## 2023-06-10 DIAGNOSIS — I6389 Other cerebral infarction: Secondary | ICD-10-CM | POA: Diagnosis not present

## 2023-06-10 DIAGNOSIS — I6310 Cerebral infarction due to embolism of unspecified precerebral artery: Secondary | ICD-10-CM | POA: Diagnosis not present

## 2023-06-10 DIAGNOSIS — I63412 Cerebral infarction due to embolism of left middle cerebral artery: Secondary | ICD-10-CM

## 2023-06-10 DIAGNOSIS — I5022 Chronic systolic (congestive) heart failure: Secondary | ICD-10-CM | POA: Diagnosis not present

## 2023-06-10 LAB — BASIC METABOLIC PANEL
Anion gap: 12 (ref 5–15)
BUN: 11 mg/dL (ref 8–23)
CO2: 18 mmol/L — ABNORMAL LOW (ref 22–32)
Calcium: 8.6 mg/dL — ABNORMAL LOW (ref 8.9–10.3)
Chloride: 110 mmol/L (ref 98–111)
Creatinine, Ser: 1.54 mg/dL — ABNORMAL HIGH (ref 0.44–1.00)
GFR, Estimated: 38 mL/min — ABNORMAL LOW (ref >60)
Glucose, Bld: 81 mg/dL (ref 70–99)
Potassium: 3.1 mmol/L — ABNORMAL LOW (ref 3.5–5.1)
Sodium: 140 mmol/L (ref 135–145)

## 2023-06-10 LAB — RAPID URINE DRUG SCREEN, HOSP PERFORMED
Amphetamines: NOT DETECTED
Barbiturates: NOT DETECTED
Benzodiazepines: NOT DETECTED
Cocaine: NOT DETECTED
Opiates: NOT DETECTED
Tetrahydrocannabinol: NOT DETECTED

## 2023-06-10 LAB — LIPID PANEL
Cholesterol: 85 mg/dL (ref 0–200)
HDL: 26 mg/dL — ABNORMAL LOW (ref >40)
LDL Cholesterol: 40 mg/dL (ref 0–99)
Total CHOL/HDL Ratio: 3.3 {ratio}
Triglycerides: 96 mg/dL (ref <150)
VLDL: 19 mg/dL (ref 0–40)

## 2023-06-10 LAB — TSH: TSH: 1.243 u[IU]/mL (ref 0.350–4.500)

## 2023-06-10 LAB — HIV ANTIBODY (ROUTINE TESTING W REFLEX): HIV Screen 4th Generation wRfx: NONREACTIVE

## 2023-06-10 LAB — HEMOGLOBIN A1C
Hgb A1c MFr Bld: 5.9 % — ABNORMAL HIGH (ref 4.8–5.6)
Mean Plasma Glucose: 122.63 mg/dL

## 2023-06-10 MED ORDER — POTASSIUM CHLORIDE CRYS ER 20 MEQ PO TBCR
40.0000 meq | EXTENDED_RELEASE_TABLET | Freq: Once | ORAL | Status: AC
  Administered 2023-06-10: 40 meq via ORAL
  Filled 2023-06-10: qty 2

## 2023-06-10 MED ORDER — LEVOTHYROXINE SODIUM 100 MCG PO TABS
100.0000 ug | ORAL_TABLET | Freq: Every day | ORAL | Status: DC
Start: 2023-06-11 — End: 2023-06-17
  Administered 2023-06-11 – 2023-06-17 (×6): 100 ug via ORAL
  Filled 2023-06-10 (×6): qty 1

## 2023-06-10 MED ORDER — ROSUVASTATIN CALCIUM 20 MG PO TABS
20.0000 mg | ORAL_TABLET | Freq: Every day | ORAL | Status: DC
Start: 2023-06-10 — End: 2023-06-17
  Administered 2023-06-10 – 2023-06-17 (×6): 20 mg via ORAL
  Filled 2023-06-10 (×6): qty 1

## 2023-06-10 MED ORDER — DAPAGLIFLOZIN PROPANEDIOL 10 MG PO TABS
10.0000 mg | ORAL_TABLET | Freq: Every day | ORAL | Status: DC
Start: 2023-06-10 — End: 2023-06-17
  Administered 2023-06-10 – 2023-06-17 (×6): 10 mg via ORAL
  Filled 2023-06-10 (×9): qty 1

## 2023-06-10 MED ORDER — ASPIRIN 81 MG PO TBEC
81.0000 mg | DELAYED_RELEASE_TABLET | Freq: Every day | ORAL | Status: DC
  Administered 2023-06-10 – 2023-06-11 (×2): 81 mg via ORAL
  Filled 2023-06-10 (×2): qty 1

## 2023-06-10 MED ORDER — METOPROLOL TARTRATE 25 MG PO TABS
25.0000 mg | ORAL_TABLET | Freq: Two times a day (BID) | ORAL | Status: DC
  Administered 2023-06-10 – 2023-06-11 (×2): 25 mg via ORAL
  Filled 2023-06-10 (×3): qty 1

## 2023-06-10 NOTE — Evaluation (Signed)
Occupational Therapy Evaluation Patient Details Name: [name] MRN: [id number] DOB: January 16, 1961 Today's Date: 06/10/2023   History of Present Illness 63 y.o. female presenting 06/09/23 with right-sided weakness, fall, slurred speech. 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 Pt currently needing up to min A for ADLs, CGA for bed mobility and CGA for transfers without AD. Pt with R visual deficits needing cues to locate soap and other items on R side during session, also with decr cognition. Pt presenting with impairments listed below, will follow acutely. Recommend HHOT at d/c.       If plan is discharge home, recommend the following: A little help with walking and/or transfers;A little help with bathing/dressing/bathroom;Assistance with cooking/housework;Assist for transportation;Help with stairs or ramp for entrance    Functional Status Assessment  Patient has had a recent decline in their functional status and demonstrates the ability to make significant improvements in function in a reasonable and predictable amount of time.  Equipment Recommendations  None recommended by OT    Recommendations for Other Services       Precautions / Restrictions Precautions Precautions: Fall Precaution Comments: fell when had CVA; landed on her rt side Restrictions Weight Bearing Restrictions Per Provider Order: No      Mobility Bed Mobility Overal bed mobility: Needs Assistance Bed Mobility: Supine to Sit     Supine to sit: Contact  guard          Transfers Overall transfer level: Needs assistance Equipment used: None Transfers: Sit to/from Stand Sit to Stand: Contact guard assist                  Balance Overall balance assessment: No apparent balance deficits (not formally assessed)                                         ADL either performed or assessed with clinical judgement   ADL Overall ADL's : Needs assistance/impaired Eating/Feeding: Set up   Grooming: Minimal assistance Grooming Details (indicate cue type and reason): cues to locate items on R side Upper Body Bathing: Minimal assistance;Sitting   Lower Body Bathing: Sitting/lateral leans;Minimal assistance   Upper Body Dressing : Minimal assistance;Sitting   Lower Body Dressing: Sitting/lateral leans;Minimal assistance   Toilet Transfer: Ambulation;Contact guard assist   Toileting- Clothing Manipulation and Hygiene: Contact guard assist       Functional mobility during ADLs: Contact guard assist       Vision   Vision Assessment?: Yes Eye Alignment: Within Functional Limits Ocular Range of Motion: Restricted on the right Alignment/Gaze Preference: Within Defined Limits Tracking/Visual Pursuits: Decreased smoothness of eye movement to RIGHT superior field;Decreased smoothness of eye movement to RIGHT inferior field Convergence: Within functional limits Additional Comments: tracks past midline but ~50% to R side, difficulty distinguishing stimuli on R side     Perception Perception: Not tested       Praxis Praxis: Not tested       Pertinent Vitals/Pain Pain Assessment Pain Assessment: No/denies pain  Extremity/Trunk Assessment Upper Extremity Assessment Upper Extremity Assessment: RUE deficits/detail RUE Deficits / Details: 3/5 ROM and strength, weaker than LUE but functional for BADL RUE Coordination: decreased fine motor   Lower Extremity Assessment Lower Extremity Assessment: Defer to PT  evaluation LLE Deficits / Details: reports feels slightly weaker than rt (not appreciable) LLE Sensation: WNL   Cervical / Trunk Assessment Cervical / Trunk Assessment: Other exceptions Cervical / Trunk Exceptions: overweight   Communication Communication Communication: No apparent difficulties Cueing Techniques: Verbal cues   Cognition Arousal: Alert Behavior During Therapy: WFL for tasks assessed/performed Overall Cognitive Status: Impaired/Different from baseline Area of Impairment: Problem solving, Memory, Awareness                     Memory: Decreased short-term memory     Awareness: Emergent Problem Solving: Difficulty sequencing, Requires verbal cues General Comments: could not recall 3 words or staet months of year backwards, had her try to remember; could not problem-solve how to put on a face mask when she received it in her left hand (trying to put ear loop around her nose) ?apraxia     General Comments  VSS    Exercises     Shoulder Instructions      Home Living Family/patient expects to be discharged to:: Private residence Living Arrangements: Children (daughter) Available Help at Discharge: Family;Available 24 hours/day Type of Home: Apartment Home Access: Level entry     Home Layout: One level     Bathroom Shower/Tub: Tub/shower unit         Home Equipment: None          Prior Functioning/Environment Prior Level of Function : Independent/Modified Independent;Driving             Mobility Comments: no AD use ADLs 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, ind with ADL        OT Problem List: Decreased strength;Decreased range of motion;Decreased activity tolerance;Impaired balance (sitting and/or standing);Impaired vision/perception;Decreased safety awareness;Decreased cognition;Decreased coordination      OT Treatment/Interventions: Self-care/ADL training;Therapeutic exercise;Energy conservation;DME and/or AE  instruction;Therapeutic activities;Patient/family education;Balance training;Cognitive remediation/compensation;Visual/perceptual remediation/compensation    OT Goals(Current goals can be found in the care plan section) Acute Rehab OT Goals Patient Stated Goal: none stated OT Goal Formulation: With patient Time For Goal Achievement: 06/24/23 Potential to Achieve Goals: Good ADL Goals Pt Will Perform Upper Body Dressing: with supervision;sitting Pt Will Perform Lower Body Dressing: with supervision;sitting/lateral leans;sit to/from stand Pt/caregiver will Perform Home Exercise Program: With written HEP provided;Right Upper extremity;With Supervision Additional ADL Goal #1: pt will perform visual scanning activity with min cues to locate items on R in prep for ADLs Additional ADL Goal #2: pt will recieve passing score on pillbox assessment in prep for ADLs  OT Frequency: Min 1X/week    Co-evaluation              AM-PAC OT "6 Clicks" Daily Activity     Outcome Measure Help from another person eating meals?: None Help from another person taking care of personal grooming?: A Little Help from another person toileting, which includes using toliet, bedpan, or urinal?: A Little Help from another person bathing (including washing, rinsing, drying)?: A Little Help from another person to put on and taking off regular upper body clothing?: A Little Help from another person to put on and taking off regular lower body clothing?: A Little 6 Click Score: 19   End of Session Nurse Communication: Mobility status  Activity Tolerance: Patient  tolerated treatment well Patient left: in chair;with call bell/phone within reach  OT Visit Diagnosis: Unsteadiness on feet (R26.81);Other abnormalities of gait and mobility (R26.89);Muscle weakness (generalized) (M62.81);History of falling (Z91.81);Other symptoms and signs involving the nervous system (R29.898)                Time: 9562-1308 OT Time  Calculation (min): 23 min Charges:  OT General Charges $OT Visit: 1 Visit OT Evaluation $OT Eval Moderate Complexity: 1 Mod OT Treatments $Self Care/Home Management : 8-22 mins  Carver Fila, OTD, OTR/L SecureChat Preferred Acute Rehab [phone]   Carver Fila Koonce 06/10/2023, 1:04 PM

## 2023-06-10 NOTE — Assessment & Plan Note (Addendum)
Euvolemic on exam. Caution restarting GDMT in setting of recent CVA and soft blood pressures. Holding Entresto, carvedilol, spironolactone - Cardiology consulted, appreciate recommendations - Restart GDMT cautiously - Restarted [place]

## 2023-06-10 NOTE — Progress Notes (Signed)
STROKE TEAM PROGRESS NOTE   SUBJECTIVE (INTERVAL HISTORY) No family is at the bedside.  Overall her condition is rapidly improving. Pt was initially sitting in chair and NT helped her back to bed during encounter. She is well known to cardiology for CHF and EF stable at 35% but she had clearly embolic stroke and source likely from cardiac source, recommend cardiology consult.    OBJECTIVE Temp:  [97.2 F (36.2 C)-98.5 F (36.9 C)] 98.4 F (36.9 C) (01/18 1302) Pulse Rate:  [77-80] 79 (01/18 1302) Cardiac Rhythm: Atrial fibrillation (01/18 0700) Resp:  [18-21] 18 (01/18 1302) BP: (92-113)/(66-82) 92/66 (01/18 1302) SpO2:  [97 %-100 %] 100 % (01/18 1302)  Recent Labs  Lab 06/09/23 1054  GLUCAP 96   Recent Labs  Lab 06/09/23 1053 06/09/23 1154 06/10/23 0603  NA 136 140 140  K 3.3* 3.3* 3.1*  CL 107 105 110  CO2 20*  --  18*  GLUCOSE 102* 96 81  BUN 12 12 11   CREATININE 1.66* 1.70* 1.54*  CALCIUM 8.1*  --  8.6*   Recent Labs  Lab 06/09/23 1053  AST 21  ALT 15  ALKPHOS 70  BILITOT 0.7  PROT 6.3*  ALBUMIN 3.2*   Recent Labs  Lab 06/09/23 1053 06/09/23 1154  WBC 5.2  --   NEUTROABS 3.2  --   HGB 13.6 13.9  HCT 42.5 41.0  MCV 90.0  --   PLT 83*  --    No results for input(s): "CKTOTAL", "CKMB", "CKMBINDEX", "TROPONINI" in the last 168 hours. Recent Labs    06/09/23 1053  LABPROT 18.8*  INR 1.6*   No results for input(s): "COLORURINE", "LABSPEC", "PHURINE", "GLUCOSEU", "HGBUR", "BILIRUBINUR", "KETONESUR", "PROTEINUR", "UROBILINOGEN", "NITRITE", "LEUKOCYTESUR" in the last 72 hours.  Invalid input(s): "APPERANCEUR"     Component Value Date/Time   CHOL 85 06/10/2023 0603   TRIG 96 06/10/2023 0603   HDL 26 (L) 06/10/2023 0603   CHOLHDL 3.3 06/10/2023 0603   VLDL 19 06/10/2023 0603   LDLCALC 40 06/10/2023 0603   Lab Results  Component Value Date   HGBA1C 5.9 (H) 06/10/2023      Component Value Date/Time   LABOPIA NONE DETECTED 06/10/2023 1210    COCAINSCRNUR NONE DETECTED 06/10/2023 1210   LABBENZ NONE DETECTED 06/10/2023 1210   AMPHETMU NONE DETECTED 06/10/2023 1210   THCU NONE DETECTED 06/10/2023 1210   LABBARB NONE DETECTED 06/10/2023 1210    Recent Labs  Lab 06/09/23 1130  ETH <10    I have personally reviewed the radiological images below and agree with the radiology interpretations.  MR BRAIN WO CONTRAST Result Date: 06/10/2023 CLINICAL DATA:  Acute neurologic deficit EXAM: MRI HEAD WITHOUT CONTRAST TECHNIQUE: Multiplanar, multiecho pulse sequences of the brain and surrounding structures were obtained without intravenous contrast. COMPARISON:  12/10/2007 FINDINGS: Brain: There is a small acute infarcts affecting the posterior left insula and the left temporal lobe. Chronic microhemorrhage in the left frontal lobe. There is multifocal hyperintense T2-weighted signal within the white matter. Parenchymal volume and CSF spaces are normal. 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 Vascular: Normal flow voids. Skull and upper cervical spine: Normal calvarium and skull base. Visualized upper cervical spine and soft tissues are normal. Sinuses/Orbits:No paranasal sinus fluid levels or advanced mucosal thickening. No mastoid or middle ear effusion. Normal orbits. IMPRESSION: 1. Small acute infarcts affecting the posterior left insula and the left temporal lobe. No hemorrhage or mass effect. 2. Old left occipital infarct and  multiple old bilateral cerebellar small vessel infarcts. Electronically Signed   By: Deatra Robinson M.D.   On: 06/10/2023 02:50   CT ANGIO HEAD NECK W WO CM W PERF (CODE STROKE) Result Date: 06/09/2023 CLINICAL DATA:  Neuro deficit, acute, stroke suspected. Slurred speech. Facial droop. EXAM: CT ANGIOGRAPHY HEAD AND NECK CT PERFUSION BRAIN TECHNIQUE: Multidetector CT imaging of the head and neck was performed using the standard protocol during bolus administration of intravenous  contrast. Multiplanar CT image reconstructions and MIPs were obtained to evaluate the vascular anatomy. Carotid stenosis measurements (when applicable) are obtained utilizing NASCET criteria, using the distal internal carotid diameter as the denominator. Multiphase CT imaging of the brain was performed following IV bolus contrast injection. Subsequent parametric perfusion maps were calculated using RAPID software. RADIATION DOSE REDUCTION: This exam was performed according to the departmental dose-optimization program which includes automated exposure control, adjustment of the mA and/or kV according to patient size and/or use of iterative reconstruction technique. CONTRAST:  OMNIPAQUE IOHEXOL 350 MG/ML SOLN COMPARISON:  None Available. Head CT immediately prior FINDINGS: CTA NECK FINDINGS Aortic arch: Normal appearance with normal branching pattern. Right carotid system: Common carotid artery is tortuous but widely patent to the bifurcation. No bifurcation atherosclerotic disease. Cervical ICA widely patent. Left carotid system: Left common carotid artery widely patent but tortuous. No carotid bifurcation disease. Cervical ICA is normal. Vertebral arteries: Right vertebral artery takes an early origin from the proximal subclavian artery and is then widely patent through the cervical region to the foramen magnum. Left vertebral artery origin is normal and the vessel appears normal through the cervical region. Skeleton: Ordinary cervical spondylosis. Other neck: No mass or lymphadenopathy. Previous thyroidectomy. Upper chest: Mild scarring at the left lung apex. No apparent change since June of 2023. Review of the MIP images confirms the above findings CTA HEAD FINDINGS Anterior circulation: Both internal carotid arteries are widely patent through the skull base and siphon regions. Minimal siphon atherosclerotic calcification but no stenosis. Anterior cerebral arteries are patent and normal. Right middle  cerebral artery is normal. Focal embolus within the left inferior division M2 branch with diminished distal perfusion. Posterior circulation: Both vertebral arteries widely patent through the foramen magnum to the basilar artery. Diminutive but widely patent basilar artery. Posterior circulation branch vessels are normal. Dominant anterior circulation supply of the right PCA. Venous sinuses: Patent and normal. Anatomic variants: None other significant. Review of the MIP images confirms the above findings CT Brain Perfusion Findings: ASPECTS: 10 CBF (<30%) Volume: 49mL Perfusion (Tmax>6.0s) volume: 53mL Mismatch Volume: 4mL Infarction Location:Left parietal lobe. IMPRESSION: 1. Focal embolus within the left inferior division M2 branch with diminished distal perfusion. 2. 49 mL of core infarct in the left parietal lobe. 53 mL of ischemic penumbra. 4 mL of mismatch. 3. No other significant vascular finding. Tortuous common carotid arteries bilaterally. No carotid bifurcation disease. 4. Early origin of the right vertebral artery from the proximal subclavian artery. This is a normal anatomic variant. 5. These results were communicated to Dr. Amada Jupiter at 11:45 am on 06/09/2023 by text page via the [facility] messaging system. Electronically Signed   By: Paulina Fusi M.D.   On: 06/09/2023 11:45   CT HEAD CODE STROKE WO CONTRAST Result Date: 06/09/2023 CLINICAL DATA:  Code stroke. Slurred speech, right-sided weakness, facial droop EXAM: CT HEAD WITHOUT CONTRAST TECHNIQUE: Contiguous axial images were obtained from the base of the skull through the vertex without intravenous contrast. RADIATION DOSE REDUCTION: This exam was performed  according to the departmental dose-optimization program which includes automated exposure control, adjustment of the mA and/or kV according to patient size and/or use of iterative reconstruction technique. COMPARISON:  03/06/2012 CT head FINDINGS: Brain: No evidence of acute infarction,  hemorrhage, mass, mass effect, or midline shift. No hydrocephalus or extra-axial collection. Redemonstrated encephalomalacia in the left parietal and occipital lobes. Advanced atrophy for age. Partial empty sella. Normal craniocervical junction. Vascular: Minimal hyperdensity in the left insular M2 (series 3, image 14). No other hyperdense vessel. Atherosclerotic calcifications in the intracranial carotid arteries. Skull: Negative for fracture or focal lesion. Sinuses/Orbits: Mild mucosal thickening in the paranasal sinuses. No acute finding in the orbits. Other: The mastoid air cells are well aerated. ASPECTS [facility] Stroke Program Early CT Score) - Ganglionic level infarction (caudate, lentiform nuclei, internal capsule, insula, M1-M3 cortex): 7 - Supraganglionic infarction (M4-M6 cortex): 3 Total score (0-10 with 10 being normal): 10 IMPRESSION: 1. No acute intracranial hemorrhage. ASPECTS is 10. 2. Minimal hyperdensity in the left insular M2, which could represent thrombus. 3. Redemonstrated encephalomalacia in the left parietal and occipital lobes. Imaging results were communicated on 06/09/2023 at 11:10 am to provider Dr. Amada Jupiter via secure text paging. Electronically Signed   By: Wiliam Ke M.D.   On: 06/09/2023 11:10   ECHOCARDIOGRAM COMPLETE Result Date: 06/06/2023    ECHOCARDIOGRAM REPORT   Patient Name:   SARIANA PAYSON Date of Exam: 06/06/2023 Medical Rec #:  [id number]        Height:       57.0 in Accession #:    [id number]       Weight:       181.2 lb Date of Birth:  10-10-1960        BSA:          1.725 m Patient Age:    62 years         BP:           96/62 mmHg Patient Gender: F                HR:           72 bpm. Exam Location:  Outpatient Procedure: 2D Echo, Intracardiac Opacification Agent, Color Doppler and Cardiac            Doppler Indications:    CHF  History:        Patient has prior history of Echocardiogram examinations, most                 recent 05/25/2022. CHF and  Cardiomyopathy, S/P TAVR, Pulmonary                 HTN; Risk Factors:Hypertension.                  Mitral Valve: 26 mm valve is present in the mitral position.                 Procedure Date: 12/2008.  Sonographer:    Webb Laws Referring Phys: [id number] [place] LEE IMPRESSIONS  1. Left ventricular ejection fraction, by estimation, is 35 to 40%. The left ventricle has moderately decreased function. The left ventricle demonstrates regional wall motion abnormalities (see scoring diagram/findings for description). There is mild asymmetric left ventricular hypertrophy of the septal segment. Left ventricular diastolic function could not be evaluated. Elevated left ventricular end-diastolic pressure.  2. Right ventricular systolic function is normal. The right ventricular size is normal. Tricuspid regurgitation signal is inadequate for assessing PA pressure.  3. The mitral valve has been repaired/replaced. Trivial mitral valve regurgitation. At risk for mild mitral mitral stenosis. There is a 26 mm present in the mitral position. Procedure Date: 12/2008.  4. The aortic valve is normal in structure. Aortic valve regurgitation is not visualized. No aortic stenosis is present.  5. The inferior vena cava is normal in size with greater than 50% respiratory variability, suggesting right atrial pressure of 3 mmHg. FINDINGS  Left Ventricle: Left ventricular ejection fraction, by estimation, is 35 to 40%. The left ventricle has moderately decreased function. The left ventricle demonstrates regional wall motion abnormalities. The left ventricular internal cavity size was normal in size. There is mild asymmetric left ventricular hypertrophy of the septal segment. Left ventricular diastolic function could not be evaluated due to mitral valve repair. Left ventricular diastolic function could not be evaluated. Elevated left ventricular end-diastolic pressure.  LV Wall Scoring: The entire apex is akinetic. The anterior wall,  antero-lateral wall, anterior septum, inferior wall, posterior wall, mid inferoseptal segment, and basal inferoseptal segment are hypokinetic. Right Ventricle: The right ventricular size is normal. No increase in right ventricular wall thickness. Right ventricular systolic function is normal. Tricuspid regurgitation signal is inadequate for assessing PA pressure. Left Atrium: Left atrial size was normal in size. Right Atrium: Right atrial size was normal in size. Pericardium: There is no evidence of pericardial effusion. Presence of epicardial fat layer. Mitral Valve: The mitral valve has been repaired/replaced. Trivial mitral valve regurgitation. There is a 26 mm present in the mitral position. Procedure Date: 12/2008. At risk for mild mitral mitral valve stenosis. MV peak gradient, 6.5 mmHg. The mean mitral valve gradient is 3.0 mmHg. Tricuspid Valve: The tricuspid valve is normal in structure. Tricuspid valve regurgitation is not demonstrated. No evidence of tricuspid stenosis. Aortic Valve: The aortic valve is normal in structure. Aortic valve regurgitation is not visualized. No aortic stenosis is present. Aortic valve mean gradient measures 12.6 mmHg. Aortic valve peak gradient measures 21.6 mmHg. Aortic valve area, by VTI measures 0.81 cm. Pulmonic Valve: The pulmonic valve was normal in structure. Pulmonic valve regurgitation is not visualized. No evidence of pulmonic stenosis. Aorta: The aortic root is normal in size and structure. Venous: The inferior vena cava is normal in size with greater than 50% respiratory variability, suggesting right atrial pressure of 3 mmHg. IAS/Shunts: No atrial level shunt detected by color flow Doppler.  LEFT VENTRICLE PLAX 2D LVIDd:         5.30 cm      Diastology LVIDs:         4.40 cm      LV e' medial:    4.57 cm/s LV PW:         0.70 cm      LV E/e' medial:  22.1 LV IVS:        1.10 cm      LV e' lateral:   4.46 cm/s LVOT diam:     1.90 cm      LV E/e' lateral: 22.6 LV SV:          41 LV SV Index:   24 LVOT Area:     2.84 cm  LV Volumes (MOD) LV vol d, MOD A2C: 126.0 ml LV vol d, MOD A4C: 153.0 ml LV vol s, MOD A2C: 83.3 ml LV vol s, MOD A4C: 100.0 ml LV SV MOD A2C:     42.7 ml LV SV MOD A4C:     153.0 ml LV SV MOD  BP:      50.9 ml RIGHT VENTRICLE RV Basal diam:  2.40 cm RV S prime:     4.90 cm/s LEFT ATRIUM             Index        RIGHT ATRIUM          Index LA diam:        4.90 cm 2.84 cm/m   RA Area:     6.02 cm LA Vol (A2C):   33.5 ml 19.42 ml/m  RA Volume:   7.74 ml  4.49 ml/m LA Vol (A4C):   38.7 ml 22.44 ml/m LA Biplane Vol: 38.3 ml 22.21 ml/m  AORTIC VALVE AV Area (Vmax):    0.81 cm AV Area (Vmean):   0.76 cm AV Area (VTI):     0.81 cm AV Vmax:           232.40 cm/s AV Vmean:          166.200 cm/s AV VTI:            0.511 m AV Peak Grad:      21.6 mmHg AV Mean Grad:      12.6 mmHg LVOT Vmax:         66.10 cm/s LVOT Vmean:        44.700 cm/s LVOT VTI:          0.146 m LVOT/AV VTI ratio: 0.29  AORTA Ao Root diam: 2.30 cm Ao Asc diam:  3.30 cm MITRAL VALVE MV Area (PHT): 2.91 cm     SHUNTS MV Area VTI:   0.70 cm     Systemic VTI:  0.15 m MV Peak grad:  6.5 mmHg     Systemic Diam: 1.90 cm MV Mean grad:  3.0 mmHg MV Vmax:       1.27 m/s MV Vmean:      78.8 cm/s MV Decel Time: 261 msec MV E velocity: 101.00 cm/s MV A velocity: 123.00 cm/s MV E/A ratio:  0.82 Kardie Tobb DO Electronically signed by Thomasene Ripple DO Signature Date/Time: 06/06/2023/3:55:36 PM    Final      PHYSICAL EXAM  Temp:  [97.2 F (36.2 C)-98.5 F (36.9 C)] 98.4 F (36.9 C) (01/18 1302) Pulse Rate:  [77-80] 79 (01/18 1302) Resp:  [18-21] 18 (01/18 1302) BP: (92-113)/(66-82) 92/66 (01/18 1302) SpO2:  [97 %-100 %] 100 % (01/18 1302)  General - Well nourished, well developed, in no apparent distress.  Ophthalmologic - fundi not visualized due to noncooperation.  Cardiovascular - Regular rhythm and rate.  Mental Status -  Level of arousal and orientation to time, place, and person were  intact. Language exam showed occasional word finding difficulty, with hesitation of speech. Following all simple commands. Naming 3/4 and able to repeat simple sentences but some difficulty with complex sentences  Cranial Nerves II - XII - II - Visual field intact OU, but right simultagnosia with bilateral visual stimulation. III, IV, VI - Extraocular movements intact. V - Facial sensation intact bilaterally. VII - mild right facial asymmetry VIII - Hearing & vestibular intact bilaterally. X - Palate elevates symmetrically. XI - Chin turning & shoulder shrug intact bilaterally. XII - Tongue protrusion intact.  Motor Strength - The patient's strength was normal in all extremities except mild right pronator drift and hand gripping weakness.  Bulk was normal and fasciculations were absent.   Motor Tone - Muscle tone was assessed at the neck and appendages and was normal.  Reflexes -  The patient's reflexes were symmetrical in all extremities and she had no pathological reflexes.  Sensory - Light touch, temperature/pinprick were assessed and were symmetrical.    Coordination - The patient had normal movements in the hands with no ataxia or dysmetria.  Tremor was absent.  Gait and Station - deferred.   ASSESSMENT/PLAN Ms. Zeta Schaum is a 63 y.o. 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 No TNK given due to outside window.    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 Old left prietal and occipital encephalomalacia CTA head and neck left M2 thorombus MRI  left MCA patchy infarcts, old left occipital and b/l cerebellar infarcts CTP 49/53 2D Echo  EF 35-40%, stable TEE pending LDL 40 HgbA1c 5.9 UDS neg SCDs for VTE prophylaxis Eliquis (apixaban) daily and ASA 81 prior to  admission, now on aspirin 81 mg daily. 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  Hx stroke on imaging - old left occipital and b/l cerebellar infarcts on MRI  Other Active Problems Cognitive impairment - vascular dementia CKD 3a, Cre 1.70->1.54 Flu infection - PCR + but asymptomatic GIB Thyroid cancer  Hospital day # 1  I discussed with primary team. I spent extensive face-to-face time with the patient, more than 50% of which was spent in counseling and coordination of care, reviewing test results, images and medication, and discussing the diagnosis, treatment plan and potential prognosis. This patient's care requiresreview of multiple databases, neurological assessment, discussion with family, other specialists and medical decision making of high complexity.  Marvel Plan, MD PhD Stroke Neurology 06/10/2023 2:06 PM    To contact Stroke Continuity provider, please refer to [email or web address]. After hours, contact General Neurology

## 2023-06-10 NOTE — Evaluation (Signed)
Clinical/Bedside Swallow Evaluation Patient Details  Name: [name] MRN: [id number] Date of Birth: [birth year]  Today's Date: 06/10/2023 Time: SLP Start Time (ACUTE ONLY): 0840 SLP Stop Time (ACUTE ONLY): 0849 SLP Time Calculation (min) (ACUTE ONLY): 9 min  Past Medical History:  Past Medical History:  Diagnosis Date   Chronic combined systolic and diastolic CHF (congestive heart failure) (HCC)    EF now down to 25-30%. LVEDP on cath was 30 mmHg with wedge pressure of 31 mmHg.   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  mean PA 74/39 mean 52, mean PCWP 31 (with V wave of 45 mmHg), LVEDP ~ 30 mmHg.   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 Progressive worsening of aortic and mitral valve disease.   Past Surgical History:  Past Surgical History:  Procedure Laterality Date   AORTIC VALVE REPAIR  August 2010   Suture plication of all 3 commissures   CARDIAC CATHETERIZATION  August 2010   Preop: nonobstructive coronary disease   CESAREAN SECTION  1981; 1996   MITRAL VALVE REPAIR  August 2010    26 mm Sorin [facility] 3D Ring Annuloplasty   RIGHT HEART CATH N/A 08/30/2016   Procedure: Right Heart Cath;  Surgeon: Laurey Morale, MD;  Location: [facility] INVASIVE CV LAB;  Service: Cardiovascular;  Laterality: N/A;   RIGHT/LEFT HEART CATH AND CORONARY ANGIOGRAPHY N/A 07/08/2016   Procedure: Right/Left Heart Cath and Coronary Angiography;  Surgeon: Marykay Lex, MD;  Location: [facility] INVASIVE CV LAB;  Service: Cardiovascular: Normal coronaries, mean RA 18, RV pressure/EDP: 70/12/18 mmHg.  mean PA 74/39 mean 52, mean PCWP 31 (with V wave of 45 mmHg), LVEDP ~ 30 mmHg.  CI 1.98 Fick. Peak AoV gradient ~15 mHg     TEE WITHOUT CARDIOVERSION N/A 07/13/2016   Procedure: Transesophageal Echocardiogram (TEE);  Surgeon: Chrystie Nose, MD;  Location: [facility] ENDOSCOPY;  Service: Cardiovascular:  EF 25-30%, dilated LV - diffuse hypokinesis, rheumatic-appearing Aortic Vallve s/p repair with moderate AS (AVA 1.1 cm^2), moderate central AI.  MV s/p repeat with mild mitral stenosis and eccentric severe MR.     TEE  WITHOUT CARDIOVERSION     TEE WITHOUT CARDIOVERSION N/A 06/28/2017   Procedure: TRANSESOPHAGEAL ECHOCARDIOGRAM (TEE);  Surgeon: Laurey Morale, MD;  Location: [facility] ENDOSCOPY;  Service: Cardiovascular;  Laterality: N/A;   TEE WITHOUT CARDIOVERSION N/A 11/21/2017   Procedure: TRANSESOPHAGEAL ECHOCARDIOGRAM (TEE);  Surgeon: Tonny Bollman, MD;  Location: [facility] OR;  Service: Open Heart Surgery;  Laterality: N/A;   THYROID LOBECTOMY Left 09/17/2009   which showed a 3.5 cm follicular variant papillary cancer   THYROIDECTOMY     "2nd OR on my  thyroid they took out the whole thing"   TRANSCATHETER AORTIC VALVE REPLACEMENT, TRANSFEMORAL  11/21/2017   Transcatheter Aortic Valve Replacement - Percutaneous Right Transfemoral Approach   TRANSCATHETER AORTIC VALVE REPLACEMENT, TRANSFEMORAL N/A 11/21/2017   Procedure: TRANSCATHETER AORTIC VALVE REPLACEMENT, TRANSFEMORAL using a 23mm Edwards Sapien 3 Aortic Valve;  Surgeon: Tonny Bollman, MD;  Location: [facility] OR;  Service: Open Heart Surgery;  Laterality: N/A;   TRANSTHORACIC ECHOCARDIOGRAM  05/2016   EF 25-30%. Moderate aortic stenosis with moderate regurgitation. Also potential aortic stenosis with likely worse than expected regurgitation   TRANSTHORACIC ECHOCARDIOGRAM  May 2012    EF 40-45%, inferior hypokinesis. Slightly increased transaortic velocity = mild AS with mild to moderate AI normal MV gradients with no MR. Significantly improved PA pressures. Paradoxical septal motion.   HPI:  [name] is a 63 y.o. 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 MRI small acute infarcts affecting the posterior left insula and the left temporal lobe. 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    Assessment / Plan / Recommendation  Clinical Impression  Pt demonstrates a minimal right sided facial/labial asymmetry. She is endentulous, does not wear dentures and consumes regular texture at baseline. She exhibited normal oromotor swallow without concerns of decreased airway protection across trials. Multiple and consecutive sips thin via straw consumed. Mastication of solid texture was adequate without oral residue. Swallow appeared timely and vocal quality remained clear. SLP wrote orders  for regular texture, thin liquids, pills with thin and no further ST needed. SLP Visit Diagnosis: Dysphagia, unspecified (R13.10)    Aspiration Risk  No limitations    Diet Recommendation Regular;Thin liquid    Liquid Administration via: Straw;Cup Medication Administration: Whole meds with liquid Supervision: Patient able to self feed Compensations: Slow rate;Small sips/bites Postural Changes: Seated upright at 90 degrees    Other  Recommendations Oral Care Recommendations: Oral care BID    Recommendations for follow up therapy are one component of a multi-disciplinary discharge planning process, led by the attending physician.  Recommendations may be updated based on patient status, additional functional criteria and insurance authorization.  Follow up Recommendations No SLP follow up      Assistance Recommended at Discharge    Functional Status Assessment Patient has not had a recent decline in their functional status  Frequency and Duration            Prognosis        Swallow Study   General Date of Onset: 06/10/23 HPI: [name] is a 63 y.o. 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 MRI small acute infarcts affecting the posterior left insula and the left temporal lobe. 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 Type of Study: Bedside Swallow Evaluation Previous Swallow Assessment:  (none) Diet Prior to this Study: NPO Temperature Spikes Noted: No Respiratory Status: Room air History of Recent Intubation: No Behavior/Cognition: Alert;Cooperative;Pleasant mood Oral Cavity Assessment: Within Functional Limits Oral Care Completed by SLP: No Oral  Cavity - Dentition: Edentulous (doesn't wear dentures) Vision: Functional for self-feeding Self-Feeding Abilities: Able to feed self Patient Positioning: Upright in bed Baseline Vocal Quality: Normal Volitional Cough: Strong Volitional Swallow: Able to elicit    Oral/Motor/Sensory Function Overall Oral Motor/Sensory Function: Mild impairment Facial ROM: Within Functional Limits Facial Symmetry: Abnormal symmetry right;Suspected CN VII (facial) dysfunction Lingual ROM: Within Functional Limits Lingual Symmetry: Abnormal symmetry right;Suspected CN XII (hypoglossal) dysfunction   Ice Chips Ice chips: Not tested   Thin Liquid Thin Liquid: Within functional limits Presentation: Cup;Straw    Nectar Thick Nectar Thick Liquid: Not tested   Honey Thick Honey Thick Liquid: Not tested   Puree Puree: Within functional limits   Solid     Solid: Within functional limits      Royce Macadamia 06/10/2023,9:00 AM

## 2023-06-10 NOTE — Consult Note (Addendum)
Cardiology Consultation   Patient ID: Cliffie Bowhay MRN: [id number]; DOB: 1960-11-28  Admit date: 06/09/2023 Date of Consult: 06/10/2023  PCP:  Oneita Hurt, No   [facility] Providers Cardiologist:  None  Advanced Heart Failure:  Marca Ancona, MD       Patient Profile:   Cinda Witty is a 63 y.o. female with a hx of aortic and mitral repairs in 2010. Afterwards, EF was mildly low in the 40-45% range. However, EF had dropped to 25-30% on 1/18 echo. TEE showed severe MR, moderate AI, moderate aortic stenosis. RHC/LHC in 2/18 showed significantly elevated LV and RV filling pressures.   She has seen Dr. Cornelius Moras since that time for evaluation for redo valve surgery. She would require high risk mitral and aortic valve replacements.   CPX was done in 3/18 showing moderate to severely decreased functional capacity.  RHC in 4/18 showed optimized filling pressures but low cardiac output.  Repeat echo in 8/18 showed EF 30-35%, severe AI with moderate AS but only mild MR noted.  The RV was mildly dilated with mildly decreased systolic function.    TEE 06/28/2017: EF 45%, severe AI, mild to moderate AS, mild mitral valve stenosis s/p MV repair   In 7/19, she had TAVR with #23 Edwards Sapien 3 THV.  Echo post-op in 7/19 showed EF 30-35%, moderate LVH, bioprosthetic aortic valve looked ok, s/p MV repair with mild mitral stenosis. 01/2018 R EIA occluded >> conservative rx per Dr Arbie Cookey.  Echo in 2/20 showed that EF remains 30-35%, stable TAVR valve, s/p MVR repair with no regurgitation or significant stenosis.    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 There was concern for possible partial valve  thrombosis and gated CT ordered to assess aortic valve.  Echo in 6/23 showed EF 30-35% with wall motion abnormalities, moderate RV dysfunction, s/p mitral valve repair with trivial MR and mean gradient 4 mmHg, bioprosthetic aortic valve s/p TAVR with mean gradient 49 mmHg and AVA 0.67 cm^2. 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 Patient was started on Eliquis.    Repeat gated CTA chest on apixaban in 11/23 showed minimal restriction to bioprosthetic aortic valve excursion with no significant thrombus, significant improvement.     Most recent echo was 05/25/22, showing EF up to 40-45% with apical hypokinesis, mild RV dysfunction, mild mitral stenosis s/p MV repair with mean gradient 6 mmHg, bioprosthetic aortic valve with mean gradient 13 mmHg (improved), normal IVC.   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   She is being seen 06/10/2023 for the evaluation of GDMT at the request of Dr Lum Babe.  History of Present Illness:   Ms. Horbal was admitted 01/17 with R weakness, CVA on CTA, affecting the posterior left insula and the left temporal lobe. +FLU swab, no Tamiflu since no sx.  Cards asked to see for GDMT recs since needs permissive HTN, Entresto, Coreg, spiro on hold.   Eliquis on hold per Neuro, ASA 81 mg every day restarted.   Ozempic on hold as well.   Ms. Kasik  feels well.  She still has right-sided weakness, but is not in any pain.  She is optimistic and looking forward to going home.  Her daughter has come to live with her.  She has not had palpitations, no presyncope or syncope.  She is compliant with her medications.  She does not feel lightheaded or dizzy.  She has not had chest pain.  She feels her respiratory status is at baseline.   Past Medical History:   Diagnosis Date   Chronic combined systolic and diastolic CHF (congestive heart failure) (HCC)    EF now down to 25-30%. LVEDP on cath was 30 mmHg with wedge pressure of 31 mmHg.   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  mean PA 74/39 mean 52, mean PCWP 31 (with V wave of 45 mmHg), LVEDP ~ 30 mmHg.   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 Progressive worsening of aortic and mitral valve disease.    Past Surgical History:  Procedure Laterality Date   AORTIC VALVE REPAIR  August 2010   Suture plication of all 3 commissures   CARDIAC CATHETERIZATION  August 2010   Preop: nonobstructive coronary disease   CESAREAN  SECTION  1981; 1996   MITRAL VALVE REPAIR  August 2010    26 mm Sorin [facility] 3D Ring Annuloplasty   RIGHT HEART CATH N/A 08/30/2016   Procedure: Right Heart Cath;  Surgeon: Laurey Morale, MD;  Location: [facility] INVASIVE CV LAB;  Service: Cardiovascular;  Laterality: N/A;   RIGHT/LEFT HEART CATH AND CORONARY ANGIOGRAPHY N/A 07/08/2016   Procedure: Right/Left Heart Cath and Coronary Angiography;  Surgeon: Marykay Lex, MD;  Location: [facility] INVASIVE CV LAB;  Service: Cardiovascular: Normal coronaries, mean RA 18, RV pressure/EDP: 70/12/18 mmHg.  mean PA 74/39 mean 52, mean PCWP 31 (with V wave of 45 mmHg), LVEDP ~ 30 mmHg.  CI 1.98 Fick. Peak AoV gradient ~15 mHg     TEE WITHOUT CARDIOVERSION N/A 07/13/2016   Procedure: Transesophageal Echocardiogram (TEE);  Surgeon: Chrystie Nose, MD;  Location: [facility] ENDOSCOPY;  Service: Cardiovascular:  EF 25-30%, dilated LV - diffuse hypokinesis, rheumatic-appearing Aortic  Vallve s/p repair with moderate AS (AVA 1.1 cm^2), moderate central AI.  MV s/p repeat with mild mitral stenosis and eccentric severe MR.     TEE WITHOUT CARDIOVERSION     TEE WITHOUT CARDIOVERSION N/A 06/28/2017   Procedure: TRANSESOPHAGEAL ECHOCARDIOGRAM (TEE);  Surgeon: Laurey Morale, MD;  Location: [facility] ENDOSCOPY;  Service: Cardiovascular;  Laterality: N/A;   TEE WITHOUT CARDIOVERSION N/A 11/21/2017   Procedure: TRANSESOPHAGEAL ECHOCARDIOGRAM (TEE);  Surgeon: Tonny Bollman, MD;  Location: [facility] OR;  Service: Open Heart Surgery;  Laterality: N/A;   THYROID LOBECTOMY Left 09/17/2009   which showed a 3.5 cm follicular variant papillary cancer   THYROIDECTOMY     "2nd OR on my thyroid they took out the whole thing"   TRANSCATHETER AORTIC VALVE REPLACEMENT, TRANSFEMORAL  11/21/2017   Transcatheter Aortic Valve Replacement - Percutaneous Right Transfemoral Approach   TRANSCATHETER AORTIC VALVE REPLACEMENT, TRANSFEMORAL N/A 11/21/2017   Procedure: TRANSCATHETER AORTIC VALVE REPLACEMENT, TRANSFEMORAL using a  23mm Edwards Sapien 3 Aortic Valve;  Surgeon: Tonny Bollman, MD;  Location: [facility] OR;  Service: Open Heart Surgery;  Laterality: N/A;   TRANSTHORACIC ECHOCARDIOGRAM  05/2016   EF 25-30%. Moderate aortic stenosis with moderate regurgitation. Also potential aortic stenosis with likely worse than expected regurgitation   TRANSTHORACIC ECHOCARDIOGRAM  May 2012    EF 40-45%, inferior hypokinesis. Slightly increased transaortic velocity = mild AS with mild to moderate AI normal MV gradients with no MR. Significantly improved PA pressures. Paradoxical septal motion.     Home Medications:  Prior to Admission medications   Medication Sig Start Date End Date Taking? Authorizing Provider  acetaminophen (TYLENOL) 325 MG tablet Take 325 mg by mouth daily.   Yes [provider]  albuterol (PROVENTIL HFA;VENTOLIN HFA) 108 (90 BASE) MCG/ACT inhaler Inhale 2 puffs into the lungs every 6 (six) hours as needed for shortness of breath.    Yes [provider]  apixaban (ELIQUIS) 5 MG TABS tablet Take 1 tablet (5 mg total) by mouth 2 (two) times daily. 12/14/22  Yes Laurey Morale, MD  aspirin 81 MG chewable tablet Chew 1 tablet (81 mg total) by mouth daily. 01/10/17  Yes Graciella Freer, PA-C  beclomethasone (QVAR) 40 MCG/ACT inhaler Inhale 2 puffs into the lungs daily as needed (shortness of breath).   Yes [provider]  carvedilol (COREG) 12.5 MG tablet Take 1 tablet (12.5 mg total) by mouth 2 (two) times daily with a meal. 05/22/23  Yes Laurey Morale, MD  dapagliflozin propanediol (FARXIGA) 10 MG TABS tablet Take 1 tablet (10 mg total) by mouth daily before breakfast. 05/30/23  Yes Laurey Morale, MD  furosemide (LASIX) 20 MG tablet Take 20 mg by mouth daily. 05/31/23  Yes [provider]  levothyroxine (SYNTHROID) 100 MCG tablet Take 100 mcg by mouth daily before breakfast.   Yes [provider]  sacubitril-valsartan (ENTRESTO) 49-51 MG Take 1 tablet by mouth  2 (two) times daily. 08/10/22  Yes Laurey Morale, MD  Semaglutide, 2 MG/DOSE, (OZEMPIC, 2 MG/DOSE,) 8 MG/3ML SOPN Inject 2 mg into the skin once a week. 05/12/23  Yes Laurey Morale, MD  spironolactone (ALDACTONE) 25 MG tablet TAKE 1 TABLET(25 MG) BY MOUTH DAILY 12/16/22  Yes Laurey Morale, MD  potassium chloride (KLOR-CON) 10 MEQ tablet Take 10 mEq by mouth 2 (two) times daily. Patient not taking: Reported on 06/09/2023    [provider]  rosuvastatin (CRESTOR) 20 MG tablet TAKE 1 TABLET(20 MG) BY MOUTH  DAILY Patient not taking: Reported on 06/09/2023 09/15/22   Laurey Morale, MD    Inpatient Medications: Scheduled Meds:  aspirin EC  81 mg Oral Daily   dapagliflozin propanediol  10 mg Oral Daily   [START ON 06/11/2023] levothyroxine  100 mcg Oral QAC breakfast   potassium chloride  40 mEq Oral Once   rosuvastatin  20 mg Oral Daily   Continuous Infusions:  PRN Meds: acetaminophen **OR** acetaminophen  Allergies:    Allergies  Allergen Reactions   Morphine And Codeine Other (See Comments)    Hallucinations     Social History:   Social History   Socioeconomic History   Marital status: Single    Spouse name: Not on file   Number of children: Not on file   Years of education: Not on file   Highest education level: Not on file  Occupational History   Not on file  Tobacco Use   Smoking status: Former    Current packs/day: 0.00    Average packs/day: 0.1 packs/day for 15.0 years (1.5 ttl pk-yrs)    Types: Cigarettes    Start date: 46    Quit date: 2011    Years since quitting: 14.0   Smokeless tobacco: Never  Vaping Use   Vaping status: Never Used  Substance and Sexual Activity   Alcohol use: Never   Drug use: Never   Sexual activity: Not Currently  Other Topics Concern   Not on file  Social History Narrative   Unemployed, on disability.   Now is finally reestablished with a PCP.   She is a former smoker, quit in 2011. Denies alcohol consumption    Tries to walk routinely.   Social Drivers of Corporate investment banker Strain: Not on file  Food Insecurity: No Food Insecurity (06/09/2023)   Hunger Vital Sign    Worried About Running Out of Food in the Last Year: Never true    Ran Out of Food in the Last Year: Never true  Transportation Needs: No Transportation Needs (06/09/2023)   PRAPARE - Administrator, Civil Service (Medical): No    Lack of Transportation (Non-Medical): No  Physical Activity: Not on file  Stress: Not on file  Social Connections: Unknown (10/03/2021)   [facility] from [facility]   Social Network    Social Network: Not on file  Intimate Partner Violence: Not At Risk (06/09/2023)   Humiliation, Afraid, Rape, and Kick questionnaire    Fear of Current or Ex-Partner: No    Emotionally Abused: No    Physically Abused: No    Sexually Abused: No    Family History:   Family History  Problem Relation Age of Onset   Heart disease Mother    CVA Mother      ROS:  Please see the history of present illness.  All other ROS reviewed and negative.     Physical Exam/Data:   Vitals:   06/09/23 2217 06/09/23 2316 06/10/23 0309 06/10/23 0835  BP: 103/68 102/71 104/80 94/77  Pulse: 77 77 80 77  Resp: 18 18 18 18   Temp: 97.6 F (36.4 C) 98.5 F (36.9 C) 98.2 F (36.8 C) 98.4 F (36.9 C)  TempSrc: Oral Oral Oral Oral  SpO2: 97% 100% 98% 100%  Weight:      Height:       No intake or output data in the 24 hours ending 06/10/23 1215    06/09/2023   11:43 AM 06/09/2023  10:00 AM 05/04/2023    1:23 PM  Last 3 Weights  Weight (lbs) 178 lb 9.2 oz 178 lb 9.2 oz 181 lb 3.2 oz  Weight (kg) 81 kg 81 kg 82.192 kg     Body mass index is 38.64 kg/m.  General:  Well nourished, well developed, in no acute distress HEENT: normal with a right sided droop Neck: no JVD Vascular: No carotid bruits; Distal radial pulses 2+ bilaterally Cardiac:  normal S1, S2; RRR; soft murmur  Lungs:  clear to  auscultation bilaterally, no wheezing, rhonchi or rales  Abd: soft, nontender, no hepatomegaly  Ext: no edema Musculoskeletal:  No deformities, BUE and BLE strength weaker on the right Skin: warm and dry  Neuro: Facial droop noted, no new abnormalities Psych:  Normal affect   EKG:  The EKG was personally reviewed and demonstrates: 1/17 ECG is sinus rhythm, heart rate 74, multiple PVCs, possible incomplete left bundle with QRS 126 ms, wider than previous Telemetry:  Telemetry was personally reviewed and demonstrates: Sinus rhythm with frequent PVCs, pairs and occasional salvos  Relevant CV Studies:  ECHO: 06/05/2022  1. Left ventricular ejection fraction, by estimation, is 35 to 40%. The left ventricle has moderately decreased function. The left ventricle demonstrates regional wall motion abnormalities (see scoring diagram/findings for description). There is mild  asymmetric left ventricular hypertrophy of the septal segment. Left ventricular diastolic function could not be evaluated. Elevated left ventricular end-diastolic pressure (unable to find exact number).   2. Right ventricular systolic function is normal. The right ventricular size is normal. Tricuspid regurgitation signal is inadequate for assessing PA pressure.   3. The mitral valve has been repaired/replaced. Trivial mitral valve regurgitation. At risk for mild mitral mitral stenosis. There is a 26 mm present in the mitral position. Procedure Date: 12/2008.   4. The aortic valve is normal in structure. Aortic valve regurgitation is not visualized. No aortic stenosis is present. Aortic valve mean gradient measures 12.6 mmHg. Aortic valve peak gradient measures 21.6 mmHg.   5. The inferior vena cava is normal in size with greater than 50% respiratory variability, suggesting right atrial pressure of 3 mmHg.   ABI's: 12/22/2022  +-------+-----------+-----------+------------+------------+  ABI/TBIToday's ABIToday's TBIPrevious  ABIPrevious TBI  +-------+-----------+-----------+------------+------------+  Right 0.73       0.58       0.71        0.84          +-------+-----------+-----------+------------+------------+  Left  0.94       0.72       1.01        1.1           +-------+-----------+-----------+------------+------------+  Previous ABI on 08/16/21.   Summary:  Right: Resting right ankle-brachial index indicates moderate right lower  extremity arterial disease. The right toe-brachial index is abnormal.   Left: Resting left ankle-brachial index indicates mild left lower  extremity arterial disease. The left toe-brachial index is normal.   CARDIAC CT: 04/07/2022 Coronaries are right dominant, no anomalies.   LM: No plaque or stenosis, good flow.   LAD: Calcified plaque proximally with mild (1-24%) stenosis. There is an area of myocardial bridging in the proximal LAD.   LCX: No plaque or stenosis noted.   RCA: Mixed plaque noted in the RCA, minimal stenosis.   IMPRESSION: There is minimal restriction to bioprosthetic (TAVR) aortic valve excursion. The valve looks significantly better compared to prior study. I do not see significant thrombus. She will need echo as well.  Laboratory Data:  High Sensitivity Troponin:  No results for input(s): "TROPONINIHS" in the last 720 hours.   Chemistry Recent Labs  Lab 06/09/23 1053 06/09/23 1154 06/10/23 0603  NA 136 140 140  K 3.3* 3.3* 3.1*  CL 107 105 110  CO2 20*  --  18*  GLUCOSE 102* 96 81  BUN 12 12 11   CREATININE 1.66* 1.70* 1.54*  CALCIUM 8.1*  --  8.6*  GFRNONAA 35*  --  38*  ANIONGAP 9  --  12    Recent Labs  Lab 06/09/23 1053  PROT 6.3*  ALBUMIN 3.2*  AST 21  ALT 15  ALKPHOS 70  BILITOT 0.7   Lipids  Recent Labs  Lab 06/10/23 0603  CHOL 85  TRIG 96  HDL 26*  LDLCALC 40  CHOLHDL 3.3    Hematology Recent Labs  Lab 06/09/23 1053 06/09/23 1154  WBC 5.2  --   RBC 4.72  --   HGB 13.6 13.9  HCT 42.5  41.0  MCV 90.0  --   MCH 28.8  --   MCHC 32.0  --   RDW 15.0  --   PLT 83*  --    Thyroid  Recent Labs  Lab 06/10/23 0603  TSH 1.243    BNPNo results for input(s): "BNP", "PROBNP" in the last 168 hours.  DDimer No results for input(s): "DDIMER" in the last 168 hours.   Radiology/Studies:  MR BRAIN WO CONTRAST Result Date: 06/10/2023 CLINICAL DATA:  Acute neurologic deficit EXAM: MRI HEAD WITHOUT CONTRAST TECHNIQUE: Multiplanar, multiecho pulse sequences of the brain and surrounding structures were obtained without intravenous contrast. COMPARISON:  12/10/2007 FINDINGS: Brain: There is a small acute infarcts affecting the posterior left insula and the left temporal lobe. Chronic microhemorrhage in the left frontal lobe. There is multifocal hyperintense T2-weighted signal within the white matter. Parenchymal volume and CSF spaces are normal. 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 Vascular: Normal flow voids. Skull and upper cervical spine: Normal calvarium and skull base. Visualized upper cervical spine and soft tissues are normal. Sinuses/Orbits:No paranasal sinus fluid levels or advanced mucosal thickening. No mastoid or middle ear effusion. Normal orbits. IMPRESSION: 1. Small acute infarcts affecting the posterior left insula and the left temporal lobe. No hemorrhage or mass effect. 2. 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 Electronically Signed   By: Deatra Robinson M.D.   On: 06/10/2023 02:50   CT ANGIO HEAD NECK W WO CM W PERF (CODE STROKE) Result Date: 06/09/2023 CLINICAL DATA:  Neuro deficit, acute, stroke suspected. Slurred speech. Facial droop. EXAM: CT ANGIOGRAPHY HEAD AND NECK CT PERFUSION BRAIN TECHNIQUE: Multidetector CT imaging of the head and neck was performed using the standard protocol during bolus administration of intravenous contrast. Multiplanar CT image reconstructions and MIPs  were obtained to evaluate the vascular anatomy. Carotid stenosis measurements (when applicable) are obtained utilizing NASCET criteria, using the distal internal carotid diameter as the denominator. Multiphase CT imaging of the brain was performed following IV bolus contrast injection. Subsequent parametric perfusion maps were calculated using RAPID software. RADIATION DOSE REDUCTION: This exam was performed according to the departmental dose-optimization program which includes automated exposure control, adjustment of the mA and/or kV according to patient size and/or use of iterative reconstruction technique. CONTRAST:  OMNIPAQUE IOHEXOL 350 MG/ML SOLN COMPARISON:  None Available. Head CT immediately prior FINDINGS: CTA NECK FINDINGS Aortic arch: Normal appearance with normal branching pattern. Right carotid  system: Common carotid artery is tortuous but widely patent to the bifurcation. No bifurcation atherosclerotic disease. Cervical ICA widely patent. Left carotid system: Left common carotid artery widely patent but tortuous. No carotid bifurcation disease. Cervical ICA is normal. Vertebral arteries: Right vertebral artery takes an early origin from the proximal subclavian artery and is then widely patent through the cervical region to the foramen magnum. Left vertebral artery origin is normal and the vessel appears normal through the cervical region. Skeleton: Ordinary cervical spondylosis. Other neck: No mass or lymphadenopathy. Previous thyroidectomy. Upper chest: Mild scarring at the left lung apex. No apparent change since June of 2023. Review of the MIP images confirms the above findings CTA HEAD FINDINGS Anterior circulation: Both internal carotid arteries are widely patent through the skull base and siphon regions. Minimal siphon atherosclerotic calcification but no stenosis. Anterior cerebral arteries are patent and normal. Right middle cerebral artery is normal. Focal embolus within the left  inferior division M2 branch with diminished distal perfusion. Posterior circulation: Both vertebral arteries widely patent through the foramen magnum to the basilar artery. Diminutive but widely patent basilar artery. Posterior circulation branch vessels are normal. Dominant anterior circulation supply of the right PCA. Venous sinuses: Patent and normal. Anatomic variants: None other significant. Review of the MIP images confirms the above findings CT Brain Perfusion Findings: ASPECTS: 10 CBF (<30%) Volume: 49mL Perfusion (Tmax>6.0s) volume: 53mL Mismatch Volume: 4mL Infarction Location:Left parietal lobe. IMPRESSION: 1. Focal embolus within the left inferior division M2 branch with diminished distal perfusion. 2. 49 mL of core infarct in the left parietal lobe. 53 mL of ischemic penumbra. 4 mL of mismatch. 3. No other significant vascular finding. Tortuous common carotid arteries bilaterally. No carotid bifurcation disease. 4. Early origin of the right vertebral artery from the proximal subclavian artery. This is a normal anatomic variant. 5. These results were communicated to Dr. Amada Jupiter at 11:45 am on 06/09/2023 by text page via the [facility] messaging system. Electronically Signed   By: Paulina Fusi M.D.   On: 06/09/2023 11:45   CT HEAD CODE STROKE WO CONTRAST Result Date: 06/09/2023 CLINICAL DATA:  Code stroke. Slurred speech, right-sided weakness, facial droop EXAM: CT HEAD WITHOUT CONTRAST TECHNIQUE: Contiguous axial images were obtained from the base of the skull through the vertex without intravenous contrast. RADIATION DOSE REDUCTION: This exam was performed according to the departmental dose-optimization program which includes automated exposure control, adjustment of the mA and/or kV according to patient size and/or use of iterative reconstruction technique. COMPARISON:  03/06/2012 CT head FINDINGS: Brain: No evidence of acute infarction, hemorrhage, mass, mass effect, or midline shift. No  hydrocephalus or extra-axial collection. Redemonstrated encephalomalacia in the left parietal and occipital lobes. Advanced atrophy for age. Partial empty sella. Normal craniocervical junction. Vascular: Minimal hyperdensity in the left insular M2 (series 3, image 14). No other hyperdense vessel. Atherosclerotic calcifications in the intracranial carotid arteries. Skull: Negative for fracture or focal lesion. Sinuses/Orbits: Mild mucosal thickening in the paranasal sinuses. No acute finding in the orbits. Other: The mastoid air cells are well aerated. ASPECTS [facility] Stroke Program Early CT Score) - Ganglionic level infarction (caudate, lentiform nuclei, internal capsule, insula, M1-M3 cortex): 7 - Supraganglionic infarction (M4-M6 cortex): 3 Total score (0-10 with 10 being normal): 10 IMPRESSION: 1. No acute intracranial hemorrhage. ASPECTS is 10. 2. Minimal hyperdensity in the left insular M2, which could represent thrombus. 3. Redemonstrated encephalomalacia in the left parietal and occipital lobes. Imaging results were communicated on 06/09/2023 at 11:10 am to  provider Dr. Amada Jupiter via secure text paging. Electronically Signed   By: Wiliam Ke M.D.   On: 06/09/2023 11:10   ECHOCARDIOGRAM COMPLETE Result Date: 06/06/2023    ECHOCARDIOGRAM REPORT   Patient Name:   LETTY EKINS Date of Exam: 06/06/2023 Medical Rec #:  [id number]        Height:       57.0 in Accession #:    [id number]       Weight:       181.2 lb Date of Birth:  09-May-1961        BSA:          1.725 m Patient Age:    62 years         BP:           96/62 mmHg Patient Gender: F                HR:           72 bpm. Exam Location:  Outpatient Procedure: 2D Echo, Intracardiac Opacification Agent, Color Doppler and Cardiac            Doppler Indications:    CHF  History:        Patient has prior history of Echocardiogram examinations, most                 recent 05/25/2022. CHF and Cardiomyopathy, S/P TAVR, Pulmonary                 HTN; Risk  Factors:Hypertension.                  Mitral Valve: 26 mm valve is present in the mitral position.                 Procedure Date: 12/2008.  Sonographer:    Webb Laws Referring Phys: [id number] [place] LEE IMPRESSIONS  1. Left ventricular ejection fraction, by estimation, is 35 to 40%. The left ventricle has moderately decreased function. The left ventricle demonstrates regional wall motion abnormalities (see scoring diagram/findings for description). There is mild asymmetric left ventricular hypertrophy of the septal segment. Left ventricular diastolic function could not be evaluated. Elevated left ventricular end-diastolic pressure.  2. Right ventricular systolic function is normal. The right ventricular size is normal. Tricuspid regurgitation signal is inadequate for assessing PA pressure.  3. The mitral valve has been repaired/replaced. Trivial mitral valve regurgitation. At risk for mild mitral mitral stenosis. There is a 26 mm present in the mitral position. Procedure Date: 12/2008.  4. The aortic valve is normal in structure. Aortic valve regurgitation is not visualized. No aortic stenosis is present.  5. The inferior vena cava is normal in size with greater than 50% respiratory variability, suggesting right atrial pressure of 3 mmHg. FINDINGS  Left Ventricle: Left ventricular ejection fraction, by estimation, is 35 to 40%. The left ventricle has moderately decreased function. The left ventricle demonstrates regional wall motion abnormalities. The left ventricular internal cavity size was normal in size. There is mild asymmetric left ventricular hypertrophy of the septal segment. Left ventricular diastolic function could not be evaluated due to mitral valve repair. Left ventricular diastolic function could not be evaluated. Elevated left ventricular end-diastolic pressure.  LV Wall Scoring: The entire apex is akinetic. The anterior wall, antero-lateral wall, anterior septum, inferior wall, posterior wall,  mid inferoseptal segment, and basal inferoseptal segment are hypokinetic. Right Ventricle: The right ventricular size is normal. No increase in right ventricular wall thickness. Right ventricular systolic  function is normal. Tricuspid regurgitation signal is inadequate for assessing PA pressure. Left Atrium: Left atrial size was normal in size. Right Atrium: Right atrial size was normal in size. Pericardium: There is no evidence of pericardial effusion. Presence of epicardial fat layer. Mitral Valve: The mitral valve has been repaired/replaced. Trivial mitral valve regurgitation. There is a 26 mm present in the mitral position. Procedure Date: 12/2008. At risk for mild mitral mitral valve stenosis. MV peak gradient, 6.5 mmHg. The mean mitral valve gradient is 3.0 mmHg. Tricuspid Valve: The tricuspid valve is normal in structure. Tricuspid valve regurgitation is not demonstrated. No evidence of tricuspid stenosis. Aortic Valve: The aortic valve is normal in structure. Aortic valve regurgitation is not visualized. No aortic stenosis is present. Aortic valve mean gradient measures 12.6 mmHg. Aortic valve peak gradient measures 21.6 mmHg. Aortic valve area, by VTI measures 0.81 cm. Pulmonic Valve: The pulmonic valve was normal in structure. Pulmonic valve regurgitation is not visualized. No evidence of pulmonic stenosis. Aorta: The aortic root is normal in size and structure. Venous: The inferior vena cava is normal in size with greater than 50% respiratory variability, suggesting right atrial pressure of 3 mmHg. IAS/Shunts: No atrial level shunt detected by color flow Doppler.  LEFT VENTRICLE PLAX 2D LVIDd:         5.30 cm      Diastology LVIDs:         4.40 cm      LV e' medial:    4.57 cm/s LV PW:         0.70 cm      LV E/e' medial:  22.1 LV IVS:        1.10 cm      LV e' lateral:   4.46 cm/s LVOT diam:     1.90 cm      LV E/e' lateral: 22.6 LV SV:         41 LV SV Index:   24 LVOT Area:     2.84 cm  LV Volumes  (MOD) LV vol d, MOD A2C: 126.0 ml LV vol d, MOD A4C: 153.0 ml LV vol s, MOD A2C: 83.3 ml LV vol s, MOD A4C: 100.0 ml LV SV MOD A2C:     42.7 ml LV SV MOD A4C:     153.0 ml LV SV MOD BP:      50.9 ml RIGHT VENTRICLE RV Basal diam:  2.40 cm RV S prime:     4.90 cm/s LEFT ATRIUM             Index        RIGHT ATRIUM          Index LA diam:        4.90 cm 2.84 cm/m   RA Area:     6.02 cm LA Vol (A2C):   33.5 ml 19.42 ml/m  RA Volume:   7.74 ml  4.49 ml/m LA Vol (A4C):   38.7 ml 22.44 ml/m LA Biplane Vol: 38.3 ml 22.21 ml/m  AORTIC VALVE AV Area (Vmax):    0.81 cm AV Area (Vmean):   0.76 cm AV Area (VTI):     0.81 cm AV Vmax:           232.40 cm/s AV Vmean:          166.200 cm/s AV VTI:            0.511 m AV Peak Grad:      21.6 mmHg AV Mean  Grad:      12.6 mmHg LVOT Vmax:         66.10 cm/s LVOT Vmean:        44.700 cm/s LVOT VTI:          0.146 m LVOT/AV VTI ratio: 0.29  AORTA Ao Root diam: 2.30 cm Ao Asc diam:  3.30 cm MITRAL VALVE MV Area (PHT): 2.91 cm     SHUNTS MV Area VTI:   0.70 cm     Systemic VTI:  0.15 m MV Peak grad:  6.5 mmHg     Systemic Diam: 1.90 cm MV Mean grad:  3.0 mmHg MV Vmax:       1.27 m/s MV Vmean:      78.8 cm/s MV Decel Time: 261 msec MV E velocity: 101.00 cm/s MV A velocity: 123.00 cm/s MV E/A ratio:  0.82 Kardie Tobb DO Electronically signed by Thomasene Ripple DO Signature Date/Time: 06/06/2023/3:55:36 PM    Final      Assessment and Plan:   Chronic HFrEF -Her weight on admission was down 6 kg from previous weight from September -Continue to track weights. -Intake/output is incomplete - continue to follow  Planned for TEE for stroke  Risk Assessment/Risk Scores:         For questions or updates, please contact  [facility] Please consult [email or web address] for contact info under    Signed, Theodore Demark, PA-C  06/10/2023 12:15 PM  Personally seen and examined. Agree with APP above with the following comments:  Miss Karrie Meres, a 63 year old female with a  complex cardiac history including heart failure with reduced ejection fraction, aortic stenosis, and rheumatic heart disease, was admitted for evaluation of a new stroke. She has a history of prior valvular repair due to her rheumatic heart disease and has been managed by the advanced heart failure team. Her other comorbidities include hypertension, hyperlipidemia, type 2 diabetes, transient ischemic attack (TIA), and a history of gastrointestinal bleed.  She has been experiencing frequent premature ventricular contractions (PVCs) and occasional non-sustained ventricular tachycardia (VT). However, she denies any symptoms of palpitations or discomfort associated with these arrhythmias.  Her stroke symptoms have been improving, and she denies any breathing difficulties or other cardiac symptoms. Her ejection fraction is reported to be between 40-45%, and she has a prosthetic valve with no significant dysfunction noted in her most recent evaluation.  She is also positive for influenza but has not reported any respiratory symptoms.  Exam General:  Morbid Obesity in no acute distress HEENT: normal with no appreciable droop on my exam Neck: no JVD Cardiac:  normal S1, S2; RRR; holosystolic murmur Lungs:  clear to auscultation bilaterally, no wheezing, rhonchi or rales  Skin: warm and dry   no edema Psych:  Normal affect   Telemetry: Frequent PVCs, triplets, rare non-sustained VT  Stroke New stroke under evaluation by neurology. Permissive hypertension maintained to aid recovery. Asymptomatic from a cardiovascular perspective. Informed consent for TEE obtained, explaining procedure and risks (sore throat, 1 in 10,000 chance of complications). If no clots, likely discharge and resume home medications. If clots, anticoagulation therapy (possibly Coumadin) required. - Maintain permissive hypertension - SGLT2i today, metoprolol today; DC on home medical therapy - Plan TEE on Monday to evaluate for  embolic phenomenon  Premature Ventricular Contractions (PVCs) Frequent monomorphic PVCs and rare non-sustained VT noted. No symptoms reported. Start metoprolol and transition to home carvedilol after permissive hypertension.  Heart Failure with Reduced Ejection Fraction (HFrEF) HFrEF, status post TAVR. Current EF  40-45%. No significant prosthetic dysfunction noted in 2024. Mitral valve gradients likely elevated due to patient-prosthesis mismatch. Follow-up on echocardiogram results as part of ongoing care.  Aortic Stenosis Status post TAVR with no significant prosthetic dysfunction noted in 2024. - echo as above  Rheumatic Heart Disease Rheumatic heart disease contributing to significant vascular disease. Evaluation for blood clots during TEE planned. If clots found, consider anticoagulation therapy.  Influenza Flu positive. Asymptomatic   Riley Lam, MD FASE [facility] Cardiologist [facility]  [address] [place], [address] [place], [place] [address] [phone]  4:39 PM

## 2023-06-10 NOTE — Evaluation (Signed)
Physical Therapy Evaluation and Discharge Patient Details Name: [name] MRN: [id number] DOB: [birth year] Today's Date: 06/10/2023  History of Present Illness  63 y.o. female presenting 06/09/23 with right-sided weakness, fall, slurred speech. 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 Patient scored 49/56 on Berg Balance assessment and 22/24 on Dynamic Gait Index. Did not decr cognition vs apraxia with attempt to don her own mask. Recommend HHPT for home safety evaluation as unclear how much assistance her daughter can provide.  PT is signing off. Thank you for this referral.         If plan is discharge home, recommend the following: Assistance with cooking/housework;Direct supervision/assist for medications management;Direct supervision/assist for financial management;Supervision due to cognitive status   Can travel by private vehicle        Equipment Recommendations None recommended by PT  Recommendations for Other Services       Functional Status Assessment Patient has had a recent decline in their functional status and demonstrates the ability to make significant improvements in function in a reasonable and predictable amount of time.     Precautions / Restrictions Precautions Precautions: Fall Precaution Comments: fell when had CVA; landed on her rt side      Mobility  Bed Mobility               General bed mobility comments: up in recliner    Transfers Overall transfer level: Independent Equipment used:  None                    Ambulation/Gait Ambulation/Gait assistance: Independent Gait Distance (Feet): 460 Feet Assistive device: None Gait Pattern/deviations: WFL(Within Functional Limits)   Gait velocity interpretation: >2.62 ft/sec, indicative of community ambulatory   General Gait Details: see DGI  Stairs Stairs: Yes Stairs assistance: Modified independent (Device/Increase time) Stair Management: One rail Left, Alternating pattern, Step to pattern, Forwards Number of Stairs: 2 General stair comments: ascend step-through; descend step-to  Wheelchair Mobility     Tilt Bed    Modified Rankin (Stroke Patients Only) Modified Rankin (Stroke Patients Only) Pre-Morbid Rankin Score: No symptoms Modified Rankin: Moderate disability     Balance Overall balance assessment: Independent                               Standardized Balance Assessment Standardized Balance Assessment : Berg Balance Test, Dynamic Gait Index Berg Balance Test Sit to Stand: Able to stand  independently using hands Standing Unsupported: Able to stand safely 2 minutes Sitting with Back Unsupported but Feet Supported on Floor or Stool: Able to sit safely and securely 2 minutes Stand to Sit: Sits safely with minimal use of hands Transfers: Able to transfer safely, minor use of hands Standing Unsupported with Eyes Closed: Able to stand 10 seconds safely Standing Ubsupported with Feet Together: Able to place feet together independently and stand 1 minute safely From Standing, Reach Forward with Outstretched Arm: Can reach confidently >25 cm (10") From Standing Position, Pick up Object from Floor: Able to pick up shoe safely and easily From Standing Position,  Turn to Look Behind Over each Shoulder: Turn sideways only but maintains balance Turn 360 Degrees: Able to turn 360 degrees safely in 4 seconds or less Standing Unsupported, Alternately Place Feet on Step/Stool: Able to stand  independently and complete 8 steps >20 seconds Standing Unsupported, One Foot in Front: Able to plae foot ahead of the other independently and hold 30 seconds Standing on One Leg: Able to lift leg independently and hold equal to or more than 3 seconds Total Score: 49 Dynamic Gait Index Level Surface: Normal Change in Gait Speed: Normal Gait with Horizontal Head Turns: Normal Gait with Vertical Head Turns: Normal Gait and Pivot Turn: Normal Step Over Obstacle: Mild Impairment Step Around Obstacles: Normal Steps: Mild Impairment Total Score: 22       Pertinent Vitals/Pain Pain Assessment Pain Assessment: No/denies pain Faces Pain Scale: No hurt    Home Living Family/patient expects to be discharged to:: Private residence Living Arrangements: Children (daughter) Available Help at Discharge: Family;Available 24 hours/day Type of Home: Apartment Home Access: Level entry       Home Layout: One level Home Equipment: None      Prior Function Prior Level of Function : Independent/Modified Independent;Driving             Mobility Comments: no AD use ADLs 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, ind with ADL     Extremity/Trunk Assessment   Upper Extremity Assessment Upper Extremity Assessment: Defer to OT evaluation    Lower Extremity Assessment Lower Extremity Assessment: LLE deficits/detail LLE Deficits / Details: reports feels slightly weaker than rt (not appreciable) LLE Sensation: WNL    Cervical / Trunk Assessment Cervical / Trunk Assessment: Other exceptions Cervical / Trunk Exceptions: overweight  Communication   Communication Communication: No apparent difficulties Cueing Techniques: Verbal cues  Cognition Arousal: Alert Behavior During Therapy: WFL for tasks assessed/performed Overall Cognitive Status: Impaired/Different from baseline Area of Impairment: Problem solving, Memory                     Memory: Decreased  short-term memory       Problem Solving: Difficulty sequencing, Requires verbal cues General Comments: could not recall 3 words that OT had her try to remember; could not problem-solve how to put on a face mask when she received it in her left hand (trying to put ear loop around her nose) ?apraxia        General Comments      Exercises     Assessment/Plan    PT Assessment All further PT needs can be met in the next venue of care  PT Problem List Decreased strength;Decreased balance;Decreased cognition       PT Treatment Interventions      PT Goals (Current goals can be found in the Care Plan section)  Acute Rehab PT Goals Patient Stated Goal: none stated PT Goal Formulation: All assessment and education complete, DC therapy    Frequency       Co-evaluation               AM-PAC PT "6 Clicks" Mobility  Outcome Measure Help needed turning from your back to your side while in a flat bed without using bedrails?: None Help needed moving from lying on your back to sitting on the side of a flat bed without using bedrails?: None Help needed moving to and from a bed to a chair (including a wheelchair)?: None Help needed standing up from a chair using your arms (e.g.,  wheelchair or bedside chair)?: None Help needed to walk in hospital room?: None Help needed climbing 3-5 steps with a railing? : None 6 Click Score: 24    End of Session Equipment Utilized During Treatment: Gait belt Activity Tolerance: Patient tolerated treatment well Patient left: in chair;with call bell/phone within reach;with chair alarm set Nurse Communication: Mobility status PT Visit Diagnosis: History of falling (Z91.81);Difficulty in walking, not elsewhere classified (R26.2)    Time: 6295-2841 PT Time Calculation (min) (ACUTE ONLY): 19 min   Charges:   PT Evaluation $PT Eval Low Complexity: 1 Low   PT General Charges $$ ACUTE PT VISIT: 1 Visit          Jerolyn Center, PT Acute  Rehabilitation Services  Office [phone]   Zena Amos 06/10/2023, 12:05 PM

## 2023-06-10 NOTE — Progress Notes (Addendum)
Daily Progress Note Intern Pager: [phone]  Patient name: [name] Medical record number: [id number] Date of birth: Sep 20, 1960 Age: 63 y.o. Gender: female  Primary Care Provider: Pcp, No Consultants: Neurology Code Status: FULL CODE  Pt Overview and Major Events to Date:  1/17: Admitted  Assessment and Plan: [name] is a 63 y.o. 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 She is improving today, pending PT/OT eval.   Patient had positive flu swab however has no respiratory symptoms.  Given no symptoms, will not treat with Tamiflu.  Assessment & Plan Stroke [facility]) Acute infarcts affecting the posterior left insula. Strength improved 5/5 bilateral LE/UE. LDL 40. A1C 5.9. TSH WNL. Echo on 1/14 unchanged from prior. Passed bedside swallow per nursing today. SLP eval states no restrictions. - Neurology consulted, appreciate recommendations - Reach out for cardiology for potential TEE with known valvular abnormalities - ASA 81 mg  - F/u risk stratification labs - Permissive HTN (although pressures are normotensive to soft) - holding home eliquis/heparin drip due to size of stroke per neurology - Cardiac monitoring - Neuro checks q4h - Fall precautions - Delirium precautions - PT/OT treat Chronic systolic heart failure (HCC) Euvolemic on exam. Caution restarting GDMT in setting of recent CVA and soft blood pressures. Holding Entresto, carvedilol, spironolactone - Cardiology consulted, appreciate recommendations - Restart GDMT cautiously - Restarted Farxiga  Chronic and Stable Problems:  CKD: Cr stable T2DM: On Ozempic weekly.   Held inpatient. Check glucose on AM labs, A1c. S/p TAVR and MVR: On Eliquis 5 mg BID on hold due to size of stroke per neurology HLD: Crestor 20 mg daily on hold Hypothyroidism s/p thyroidectomy d/t papillary thyroid cancer: Restarted home synthroid 1/18  FEN/GI: HH diet PPx: SCD Dispo:Pending PT recommendations    Subjective:  Patient reports improvement in strength. She has no acute concerns.  Objective: Temp:  [97.2 F (36.2 C)-98.5 F (36.9 C)] 98.4 F (36.9 C) (01/18 0835) Pulse Rate:  [73-80] 77 (01/18 0835) Resp:  [13-21] 18 (01/18 0835) BP: (94-124)/(63-82) 94/77 (01/18 0835) SpO2:  [97 %-100 %] 100 % (01/18 0835) Weight:  [81 kg] 81 kg (01/17 1143) Physical Exam: General: NAD, sitting up at side of bed Cardiovascular: RRR, no murmur Respiratory: CTAB, normal WOB on RA Abdomen: Soft, not tender, not distended Extremities: No BLE edema Neuro: CN II: Pupils equal, bilaterally constricted, minimally responsive to light CN III, IV,VI: EOMI CV V: Normal sensation in V1, V2, V3 CVII: Symmetric smile and brow raise CN VIII: Normal hearing CN IX,X: Symmetric palate raise  CN XI: 5/5 shoulder shrug CN XII: Slight tongue protrusion toward R UE and LE strength 5/5 Normal sensation in UE and LE bilaterally  No ataxia with finger to nose  Laboratory: Most recent CBC Lab Results  Component Value Date   WBC 5.2 06/09/2023   HGB 13.9 06/09/2023   HCT 41.0 06/09/2023   MCV 90.0 06/09/2023   PLT 83 (L) 06/09/2023   Most recent BMP    Latest Ref Rng & Units 06/10/2023    6:03 AM  BMP  Glucose 70 - 99 mg/dL 81   BUN 8 - 23 mg/dL 11   Creatinine 4.40 - 1.00 mg/dL 3.47  Sodium 135 - 145 mmol/L 140   Potassium 3.5 - 5.1 mmol/L 3.1   Chloride 98 - 111 mmol/L 110   CO2 22 - 32 mmol/L 18   Calcium 8.9 - 10.3 mg/dL 8.6     Tiffany Kocher, DO 06/10/2023, 8:51 AM  PGY-2, [facility] FPTS Intern pager: [phone], text pages welcome Secure chat group [facility]  [facility] Teaching Service

## 2023-06-10 NOTE — Plan of Care (Addendum)
Admitted post fall, sensory deficits. TEE planned for Monday.    Problem: Activity: Goal: Risk for activity intolerance will decrease Outcome: Not Met (add Reason)   Problem: Nutrition: Goal: Adequate nutrition will be maintained Outcome: Not Met (add Reason)   Problem: Elimination: Goal: Will not experience complications related to bowel motility Outcome: Not Met (add Reason) Goal: Will not experience complications related to urinary retention Outcome: Not Met (add Reason)   Problem: Pain Managment: Goal: General experience of comfort will improve and/or be controlled Outcome: Not Met (add Reason)   Problem: Safety: Goal: Ability to remain free from injury will improve Outcome: Not Met (add Reason)   Problem: Skin Integrity: Goal: Risk for impaired skin integrity will decrease Outcome: Not Met (add Reason)

## 2023-06-10 NOTE — Assessment & Plan Note (Addendum)
Acute infarcts affecting the posterior left insula. Strength improved 5/5 bilateral LE/UE. LDL 40. A1C 5.9. TSH WNL. Echo on 1/14 unchanged from prior. Passed bedside swallow per nursing today. SLP eval states no restrictions. - Neurology consulted, appreciate recommendations - Reach out for cardiology for potential TEE with known valvular abnormalities - ASA 81 mg  - F/u risk stratification labs - Permissive HTN (although pressures are normotensive to soft) - holding home eliquis/heparin drip due to size of stroke per neurology - Cardiac monitoring - Neuro checks q4h - Fall precautions - Delirium precautions - PT/OT treat

## 2023-06-11 DIAGNOSIS — I63412 Cerebral infarction due to embolism of left middle cerebral artery: Secondary | ICD-10-CM | POA: Diagnosis not present

## 2023-06-11 DIAGNOSIS — I5022 Chronic systolic (congestive) heart failure: Secondary | ICD-10-CM | POA: Diagnosis not present

## 2023-06-11 DIAGNOSIS — I6310 Cerebral infarction due to embolism of unspecified precerebral artery: Secondary | ICD-10-CM | POA: Diagnosis not present

## 2023-06-11 LAB — BASIC METABOLIC PANEL
Anion gap: 8 (ref 5–15)
BUN: 10 mg/dL (ref 8–23)
CO2: 20 mmol/L — ABNORMAL LOW (ref 22–32)
Calcium: 8.7 mg/dL — ABNORMAL LOW (ref 8.9–10.3)
Chloride: 110 mmol/L (ref 98–111)
Creatinine, Ser: 1.31 mg/dL — ABNORMAL HIGH (ref 0.44–1.00)
GFR, Estimated: 46 mL/min — ABNORMAL LOW (ref >60)
Glucose, Bld: 84 mg/dL (ref 70–99)
Potassium: 4.6 mmol/L (ref 3.5–5.1)
Sodium: 138 mmol/L (ref 135–145)

## 2023-06-11 LAB — MAGNESIUM: Magnesium: 2.8 mg/dL — ABNORMAL HIGH (ref 1.7–2.4)

## 2023-06-11 MED ORDER — APIXABAN 5 MG PO TABS
5.0000 mg | ORAL_TABLET | Freq: Two times a day (BID) | ORAL | Status: DC
  Administered 2023-06-11: 5 mg via ORAL
  Filled 2023-06-11: qty 1

## 2023-06-11 MED ORDER — SODIUM CHLORIDE 0.9 % IV SOLN: INTRAVENOUS | Status: DC

## 2023-06-11 MED ORDER — ASPIRIN 81 MG PO TBEC
81.0000 mg | DELAYED_RELEASE_TABLET | Freq: Every day | ORAL | Status: DC
  Administered 2023-06-14 – 2023-06-17 (×4): 81 mg via ORAL
  Filled 2023-06-11 (×4): qty 1

## 2023-06-11 NOTE — Progress Notes (Addendum)
Daily Progress Note Intern Pager: [phone]  Patient name: [name] Medical record number: [id number] Date of birth: [birth year] Age: 63 y.o. Gender: female  Primary Care Provider: Pcp, No Consultants: Neurology, cardiology Code Status: Full  Pt Overview and Major Events to Date:  1/17 - admitted  Assessment and Plan: [name] is a 63 y.o. 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   Patient had positive flu swab however has no respiratory symptoms.  Given no symptoms, will not treat with Tamiflu. Assessment & Plan Stroke [facility]) Acute infarcts affecting the posterior left insula and L temporal lobe. LDL 40. A1C 5.9. TSH WNL. Echo on 1/14 unchanged from prior. Passed bedside swallow per nursing today. SLP eval states no restrictions. Risk strat labs: LDL 40. A1C 5.9, TSH nml. Has hx rheumatic fever w/ known valvular abnormalities. 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 Does have mildly decreased muscle strength of R extremities on my exam today - Neurology following - cardiology following - consider brain imaging today d/t decreased muscle strength of R extremities  - f/u TEE planned for 1/20, if no clots - plan for d/c and resume home medications per cards - Lopressor 25 mg BID in preparation for TEE - ASA 81 mg  - Permissive HTN (although pressures are normotensive to soft) - holding home eliquis/heparin drip due to size of stroke per neurology - Cardiac monitoring - Neuro checks q4h - Fall precautions - Delirium precautions - PT/OT treat Chronic systolic heart failure (HCC) Euvolemic on exam. Caution restarting  GDMT in setting of recent CVA and soft blood pressures. Holding Entresto, carvedilol, spironolactone - Cardiology consulted, appreciate recommendations - Restart GDMT cautiously - Restarted Farxiga   Chronic and Stable Issues: CKD: Cr stable T2DM: On Ozempic weekly.  Held inpatient. Check glucose on AM labs, A1c. S/p TAVR and MVR: On Eliquis 5 mg BID on hold due to size of stroke per neurology HLD: Crestor 20 mg daily Hypothyroidism s/p thyroidectomy d/t papillary thyroid cancer: Restarted home synthroid 1/18  FEN/GI: health healthy  PPx: SCDs Dispo:home w/ HH OT/PT pending continued medical management   Subjective:  Pt states she is feeling well this morning. She has no questions or concerns at this time.   Objective: Temp:  [97.8 F (36.6 C)-98.5 F (36.9 C)] 98.4 F (36.9 C) (01/19 0339) Pulse Rate:  [70-80] 70 (01/19 0339) Resp:  [18] 18 (01/19 0339) BP: (92-104)/(61-80) 101/64 (01/19 0339) SpO2:  [98 %-100 %] 100 % (01/19 0339) Physical Exam: General: Lying in bed, awake, smiling Cardiovascular: RRR Respiratory: CTAB anteriorly, normal effort  Extremities: SCDs in place Neuro: CN II-XII intact with exceptions of slight R tongue deviation, PER and minimally RL and continue to be constricted, mildly decreased muscle strength of RUE and RLE. finger nose finger difficult for her to perform/understand but no ataxia  Laboratory: Most recent CBC Lab Results  Component Value Date   WBC 5.2 06/09/2023   HGB 13.9 06/09/2023   HCT 41.0 06/09/2023   MCV 90.0 06/09/2023   PLT 83 (L) 06/09/2023   Most recent BMP    Latest Ref Rng & Units 06/10/2023    6:03 AM  BMP  Glucose 70 - 99 mg/dL 81  BUN 8 - 23 mg/dL 11   Creatinine 1.61 - 1.00 mg/dL 0.96   Sodium 045 - 409 mmol/L 140   Potassium 3.5 - 5.1 mmol/L 3.1   Chloride 98 - 111 mmol/L 110   CO2 22 - 32 mmol/L 18   Calcium 8.9 - 10.3 mg/dL 8.6     Colleen Alley, DO 06/11/2023, 5:22 AM  PGY-3, [facility] FPTS Intern pager: [phone], text pages welcome Secure chat group [facility] Teaching Service

## 2023-06-11 NOTE — Assessment & Plan Note (Signed)
Euvolemic on exam. Caution restarting GDMT in setting of recent CVA and soft blood pressures. Holding Entresto, carvedilol, spironolactone - Cardiology consulted, appreciate recommendations - Restart GDMT cautiously - Restarted [place]

## 2023-06-11 NOTE — Plan of Care (Signed)
Messaged by Dr. Roda Shutters with neurology via secure chat around [phone].  He spoke with cardiology who was okay with placing back on Eliquis which he did.  May need to consider other anticoagulation options if there is a thrombus on TEE.

## 2023-06-11 NOTE — Anesthesia Preprocedure Evaluation (Addendum)
Anesthesia Evaluation  Patient identified by MRN, date of birth, ID band Patient awake    Reviewed: Allergy & Precautions, NPO status , Patient's Chart, lab work & pertinent test results, reviewed documented beta blocker date and time   Airway Mallampati: II  TM Distance: >3 FB Neck ROM: Full   Comment: RAD (reactive airway disease) Dental  (+) Edentulous Upper, Edentulous Lower,    Pulmonary former smoker   breath sounds clear to auscultation       Cardiovascular hypertension, Pt. on medications and Pt. on home beta blockers +CHF   Rhythm:Regular Rate:Normal  S/P MVR (mitral valve repair) S/P TAVR (transcatheter aortic valve replacement) S/P aortic valve repair   ECHO 1/25  1. Left ventricular ejection fraction, by estimation, is 35 to 40%. The  left ventricle has moderately decreased function. The left ventricle  demonstrates regional wall motion abnormalities (see scoring  diagram/findings for description). There is mild  asymmetric left ventricular hypertrophy of the septal segment. Left  ventricular diastolic function could not be evaluated. Elevated left  ventricular end-diastolic pressure.   2. Right ventricular systolic function is normal. The right ventricular  size is normal. Tricuspid regurgitation signal is inadequate for assessing  PA pressure.   3. The mitral valve has been repaired/replaced. Trivial mitral valve  regurgitation. At risk for mild mitral mitral stenosis. There is a 26 mm  present in the mitral position. Procedure Date: 12/2008.   4. The aortic valve is normal in structure. Aortic valve regurgitation is  not visualized. No aortic stenosis is present.   5. The inferior vena cava is normal in size with greater than 50%  respiratory variability, suggesting right atrial pressure of 3 mmHg.      Neuro/Psych  PSYCHIATRIC DISORDERS  Depression   Dementia negative neurological ROS      GI/Hepatic negative GI ROS, Neg liver ROS,,,  Endo/Other  Hypothyroidism  Class 3 obesity  Renal/GU Renal InsufficiencyRenal disease     Musculoskeletal   Abdominal   Peds  Hematology  (+) Blood dyscrasia, anemia   Anesthesia Other Findings   Reproductive/Obstetrics                             Lab Results  Component Value Date   WBC 5.2 06/09/2023   HGB 13.9 06/09/2023   HCT 41.0 06/09/2023   MCV 90.0 06/09/2023   PLT 83 (L) 06/09/2023   Lab Results  Component Value Date   CREATININE 1.31 (H) 06/11/2023   BUN 10 06/11/2023   NA 138 06/11/2023   K 4.6 06/11/2023   CL 110 06/11/2023   CO2 20 (L) 06/11/2023      Anesthesia Physical Anesthesia Plan  ASA: 3  Anesthesia Plan: MAC   Post-op Pain Management: Minimal or no pain anticipated   Induction: Intravenous  PONV Risk Score and Plan: 2 and Ondansetron, Treatment may vary due to age or medical condition and Propofol infusion  Airway Management Planned: Natural Airway and Simple Face Mask  Additional Equipment:   Intra-op Plan:   Post-operative Plan:   Informed Consent: I have reviewed the patients History and Physical, chart, labs and discussed the procedure including the risks, benefits and alternatives for the proposed anesthesia with the patient or authorized representative who has indicated his/her understanding and acceptance.       Plan Discussed with: CRNA and Anesthesiologist  Anesthesia Plan Comments: (RAD (reactive airway disease))        Anesthesia  Quick Evaluation

## 2023-06-11 NOTE — Progress Notes (Signed)
STROKE TEAM PROGRESS NOTE   SUBJECTIVE (INTERVAL HISTORY) RN is at the bedside.  Pt lying in bed, no acute event overnight. Discussed with pharmacy, pt was on eliquis PTA and seems to be compliant. Now back to eliquis, pending TEE.     OBJECTIVE Temp:  [97.8 F (36.6 C)-98.4 F (36.9 C)] 98.3 F (36.8 C) (01/19 1500) Pulse Rate:  [70-80] 73 (01/19 1500) Cardiac Rhythm: Normal sinus rhythm (01/19 0700) Resp:  [18] 18 (01/19 0724) BP: (92-104)/(64-80) 98/69 (01/19 1500) SpO2:  [96 %-100 %] 100 % (01/19 1500)  Recent Labs  Lab 06/09/23 1054  GLUCAP 96   Recent Labs  Lab 06/09/23 1053 06/09/23 1154 06/10/23 0603 06/11/23 0555  NA 136 140 140 138  K 3.3* 3.3* 3.1* 4.6  CL 107 105 110 110  CO2 20*  --  18* 20*  GLUCOSE 102* 96 81 84  BUN 12 12 11 10   CREATININE 1.66* 1.70* 1.54* 1.31*  CALCIUM 8.1*  --  8.6* 8.7*  MG  --   --   --  2.8*   Recent Labs  Lab 06/09/23 1053  AST 21  ALT 15  ALKPHOS 70  BILITOT 0.7  PROT 6.3*  ALBUMIN 3.2*   Recent Labs  Lab 06/09/23 1053 06/09/23 1154  WBC 5.2  --   NEUTROABS 3.2  --   HGB 13.6 13.9  HCT 42.5 41.0  MCV 90.0  --   PLT 83*  --    No results for input(s): "CKTOTAL", "CKMB", "CKMBINDEX", "TROPONINI" in the last 168 hours. Recent Labs    06/09/23 1053  LABPROT 18.8*  INR 1.6*   No results for input(s): "COLORURINE", "LABSPEC", "PHURINE", "GLUCOSEU", "HGBUR", "BILIRUBINUR", "KETONESUR", "PROTEINUR", "UROBILINOGEN", "NITRITE", "LEUKOCYTESUR" in the last 72 hours.  Invalid input(s): "APPERANCEUR"     Component Value Date/Time   CHOL 85 06/10/2023 0603   TRIG 96 06/10/2023 0603   HDL 26 (L) 06/10/2023 0603   CHOLHDL 3.3 06/10/2023 0603   VLDL 19 06/10/2023 0603   LDLCALC 40 06/10/2023 0603   Lab Results  Component Value Date   HGBA1C 5.9 (H) 06/10/2023      Component Value Date/Time   LABOPIA NONE DETECTED 06/10/2023 1210   COCAINSCRNUR NONE DETECTED 06/10/2023 1210   LABBENZ NONE DETECTED 06/10/2023  1210   AMPHETMU NONE DETECTED 06/10/2023 1210   THCU NONE DETECTED 06/10/2023 1210   LABBARB NONE DETECTED 06/10/2023 1210    Recent Labs  Lab 06/09/23 1130  ETH <10    I have personally reviewed the radiological images below and agree with the radiology interpretations.  MR BRAIN WO CONTRAST Result Date: 06/10/2023 CLINICAL DATA:  Acute neurologic deficit EXAM: MRI HEAD WITHOUT CONTRAST TECHNIQUE: Multiplanar, multiecho pulse sequences of the brain and surrounding structures were obtained without intravenous contrast. COMPARISON:  12/10/2007 FINDINGS: Brain: There is a small acute infarcts affecting the posterior left insula and the left temporal lobe. Chronic microhemorrhage in the left frontal lobe. There is multifocal hyperintense T2-weighted signal within the white matter. Parenchymal volume and CSF spaces are normal. 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 Vascular: Normal flow voids. Skull and upper cervical spine: Normal calvarium and skull base. Visualized upper cervical spine and soft tissues are normal. Sinuses/Orbits:No paranasal sinus fluid levels or advanced mucosal thickening. No mastoid or middle ear effusion. Normal orbits. IMPRESSION: 1. Small acute infarcts affecting the posterior left insula and the left temporal lobe. No hemorrhage or mass effect. 2. Old left occipital infarct  and multiple old bilateral cerebellar small vessel infarcts. Electronically Signed   By: Deatra Robinson M.D.   On: 06/10/2023 02:50   CT ANGIO HEAD NECK W WO CM W PERF (CODE STROKE) Result Date: 06/09/2023 CLINICAL DATA:  Neuro deficit, acute, stroke suspected. Slurred speech. Facial droop. EXAM: CT ANGIOGRAPHY HEAD AND NECK CT PERFUSION BRAIN TECHNIQUE: Multidetector CT imaging of the head and neck was performed using the standard protocol during bolus administration of intravenous contrast. Multiplanar CT image reconstructions and MIPs were obtained to evaluate  the vascular anatomy. Carotid stenosis measurements (when applicable) are obtained utilizing NASCET criteria, using the distal internal carotid diameter as the denominator. Multiphase CT imaging of the brain was performed following IV bolus contrast injection. Subsequent parametric perfusion maps were calculated using RAPID software. RADIATION DOSE REDUCTION: This exam was performed according to the departmental dose-optimization program which includes automated exposure control, adjustment of the mA and/or kV according to patient size and/or use of iterative reconstruction technique. CONTRAST:  OMNIPAQUE IOHEXOL 350 MG/ML SOLN COMPARISON:  None Available. Head CT immediately prior FINDINGS: CTA NECK FINDINGS Aortic arch: Normal appearance with normal branching pattern. Right carotid system: Common carotid artery is tortuous but widely patent to the bifurcation. No bifurcation atherosclerotic disease. Cervical ICA widely patent. Left carotid system: Left common carotid artery widely patent but tortuous. No carotid bifurcation disease. Cervical ICA is normal. Vertebral arteries: Right vertebral artery takes an early origin from the proximal subclavian artery and is then widely patent through the cervical region to the foramen magnum. Left vertebral artery origin is normal and the vessel appears normal through the cervical region. Skeleton: Ordinary cervical spondylosis. Other neck: No mass or lymphadenopathy. Previous thyroidectomy. Upper chest: Mild scarring at the left lung apex. No apparent change since June of 2023. Review of the MIP images confirms the above findings CTA HEAD FINDINGS Anterior circulation: Both internal carotid arteries are widely patent through the skull base and siphon regions. Minimal siphon atherosclerotic calcification but no stenosis. Anterior cerebral arteries are patent and normal. Right middle cerebral artery is normal. Focal embolus within the left inferior division M2 branch  with diminished distal perfusion. Posterior circulation: Both vertebral arteries widely patent through the foramen magnum to the basilar artery. Diminutive but widely patent basilar artery. Posterior circulation branch vessels are normal. Dominant anterior circulation supply of the right PCA. Venous sinuses: Patent and normal. Anatomic variants: None other significant. Review of the MIP images confirms the above findings CT Brain Perfusion Findings: ASPECTS: 10 CBF (<30%) Volume: 49mL Perfusion (Tmax>6.0s) volume: 53mL Mismatch Volume: 4mL Infarction Location:Left parietal lobe. IMPRESSION: 1. Focal embolus within the left inferior division M2 branch with diminished distal perfusion. 2. 49 mL of core infarct in the left parietal lobe. 53 mL of ischemic penumbra. 4 mL of mismatch. 3. No other significant vascular finding. Tortuous common carotid arteries bilaterally. No carotid bifurcation disease. 4. Early origin of the right vertebral artery from the proximal subclavian artery. This is a normal anatomic variant. 5. These results were communicated to Dr. Amada Jupiter at 11:45 am on 06/09/2023 by text page via the [facility] messaging system. Electronically Signed   By: Paulina Fusi M.D.   On: 06/09/2023 11:45   CT HEAD CODE STROKE WO CONTRAST Result Date: 06/09/2023 CLINICAL DATA:  Code stroke. Slurred speech, right-sided weakness, facial droop EXAM: CT HEAD WITHOUT CONTRAST TECHNIQUE: Contiguous axial images were obtained from the base of the skull through the vertex without intravenous contrast. RADIATION DOSE REDUCTION: This exam was  performed according to the departmental dose-optimization program which includes automated exposure control, adjustment of the mA and/or kV according to patient size and/or use of iterative reconstruction technique. COMPARISON:  03/06/2012 CT head FINDINGS: Brain: No evidence of acute infarction, hemorrhage, mass, mass effect, or midline shift. No hydrocephalus or extra-axial collection.  Redemonstrated encephalomalacia in the left parietal and occipital lobes. Advanced atrophy for age. Partial empty sella. Normal craniocervical junction. Vascular: Minimal hyperdensity in the left insular M2 (series 3, image 14). No other hyperdense vessel. Atherosclerotic calcifications in the intracranial carotid arteries. Skull: Negative for fracture or focal lesion. Sinuses/Orbits: Mild mucosal thickening in the paranasal sinuses. No acute finding in the orbits. Other: The mastoid air cells are well aerated. ASPECTS [facility] Stroke Program Early CT Score) - Ganglionic level infarction (caudate, lentiform nuclei, internal capsule, insula, M1-M3 cortex): 7 - Supraganglionic infarction (M4-M6 cortex): 3 Total score (0-10 with 10 being normal): 10 IMPRESSION: 1. No acute intracranial hemorrhage. ASPECTS is 10. 2. Minimal hyperdensity in the left insular M2, which could represent thrombus. 3. Redemonstrated encephalomalacia in the left parietal and occipital lobes. Imaging results were communicated on 06/09/2023 at 11:10 am to provider Dr. Amada Jupiter via secure text paging. Electronically Signed   By: Wiliam Ke M.D.   On: 06/09/2023 11:10   ECHOCARDIOGRAM COMPLETE Result Date: 06/06/2023    ECHOCARDIOGRAM REPORT   Patient Name:   KEYLE URSIN Date of Exam: 06/06/2023 Medical Rec #:  [id number]        Height:       57.0 in Accession #:    [id number]       Weight:       181.2 lb Date of Birth:  05-09-1961        BSA:          1.725 m Patient Age:    62 years         BP:           96/62 mmHg Patient Gender: F                HR:           72 bpm. Exam Location:  Outpatient Procedure: 2D Echo, Intracardiac Opacification Agent, Color Doppler and Cardiac            Doppler Indications:    CHF  History:        Patient has prior history of Echocardiogram examinations, most                 recent 05/25/2022. CHF and Cardiomyopathy, S/P TAVR, Pulmonary                 HTN; Risk Factors:Hypertension.                   Mitral Valve: 26 mm valve is present in the mitral position.                 Procedure Date: 12/2008.  Sonographer:    Webb Laws Referring Phys: [id number] [place] LEE IMPRESSIONS  1. Left ventricular ejection fraction, by estimation, is 35 to 40%. The left ventricle has moderately decreased function. The left ventricle demonstrates regional wall motion abnormalities (see scoring diagram/findings for description). There is mild asymmetric left ventricular hypertrophy of the septal segment. Left ventricular diastolic function could not be evaluated. Elevated left ventricular end-diastolic pressure.  2. Right ventricular systolic function is normal. The right ventricular size is normal. Tricuspid regurgitation signal is inadequate for assessing PA pressure.  3. The mitral valve has been repaired/replaced. Trivial mitral valve regurgitation. At risk for mild mitral mitral stenosis. There is a 26 mm present in the mitral position. Procedure Date: 12/2008.  4. The aortic valve is normal in structure. Aortic valve regurgitation is not visualized. No aortic stenosis is present.  5. The inferior vena cava is normal in size with greater than 50% respiratory variability, suggesting right atrial pressure of 3 mmHg. FINDINGS  Left Ventricle: Left ventricular ejection fraction, by estimation, is 35 to 40%. The left ventricle has moderately decreased function. The left ventricle demonstrates regional wall motion abnormalities. The left ventricular internal cavity size was normal in size. There is mild asymmetric left ventricular hypertrophy of the septal segment. Left ventricular diastolic function could not be evaluated due to mitral valve repair. Left ventricular diastolic function could not be evaluated. Elevated left ventricular end-diastolic pressure.  LV Wall Scoring: The entire apex is akinetic. The anterior wall, antero-lateral wall, anterior septum, inferior wall, posterior wall, mid inferoseptal segment, and basal  inferoseptal segment are hypokinetic. Right Ventricle: The right ventricular size is normal. No increase in right ventricular wall thickness. Right ventricular systolic function is normal. Tricuspid regurgitation signal is inadequate for assessing PA pressure. Left Atrium: Left atrial size was normal in size. Right Atrium: Right atrial size was normal in size. Pericardium: There is no evidence of pericardial effusion. Presence of epicardial fat layer. Mitral Valve: The mitral valve has been repaired/replaced. Trivial mitral valve regurgitation. There is a 26 mm present in the mitral position. Procedure Date: 12/2008. At risk for mild mitral mitral valve stenosis. MV peak gradient, 6.5 mmHg. The mean mitral valve gradient is 3.0 mmHg. Tricuspid Valve: The tricuspid valve is normal in structure. Tricuspid valve regurgitation is not demonstrated. No evidence of tricuspid stenosis. Aortic Valve: The aortic valve is normal in structure. Aortic valve regurgitation is not visualized. No aortic stenosis is present. Aortic valve mean gradient measures 12.6 mmHg. Aortic valve peak gradient measures 21.6 mmHg. Aortic valve area, by VTI measures 0.81 cm. Pulmonic Valve: The pulmonic valve was normal in structure. Pulmonic valve regurgitation is not visualized. No evidence of pulmonic stenosis. Aorta: The aortic root is normal in size and structure. Venous: The inferior vena cava is normal in size with greater than 50% respiratory variability, suggesting right atrial pressure of 3 mmHg. IAS/Shunts: No atrial level shunt detected by color flow Doppler.  LEFT VENTRICLE PLAX 2D LVIDd:         5.30 cm      Diastology LVIDs:         4.40 cm      LV e' medial:    4.57 cm/s LV PW:         0.70 cm      LV E/e' medial:  22.1 LV IVS:        1.10 cm      LV e' lateral:   4.46 cm/s LVOT diam:     1.90 cm      LV E/e' lateral: 22.6 LV SV:         41 LV SV Index:   24 LVOT Area:     2.84 cm  LV Volumes (MOD) LV vol d, MOD A2C: 126.0 ml LV  vol d, MOD A4C: 153.0 ml LV vol s, MOD A2C: 83.3 ml LV vol s, MOD A4C: 100.0 ml LV SV MOD A2C:     42.7 ml LV SV MOD A4C:     153.0 ml LV SV MOD  BP:      50.9 ml RIGHT VENTRICLE RV Basal diam:  2.40 cm RV S prime:     4.90 cm/s LEFT ATRIUM             Index        RIGHT ATRIUM          Index LA diam:        4.90 cm 2.84 cm/m   RA Area:     6.02 cm LA Vol (A2C):   33.5 ml 19.42 ml/m  RA Volume:   7.74 ml  4.49 ml/m LA Vol (A4C):   38.7 ml 22.44 ml/m LA Biplane Vol: 38.3 ml 22.21 ml/m  AORTIC VALVE AV Area (Vmax):    0.81 cm AV Area (Vmean):   0.76 cm AV Area (VTI):     0.81 cm AV Vmax:           232.40 cm/s AV Vmean:          166.200 cm/s AV VTI:            0.511 m AV Peak Grad:      21.6 mmHg AV Mean Grad:      12.6 mmHg LVOT Vmax:         66.10 cm/s LVOT Vmean:        44.700 cm/s LVOT VTI:          0.146 m LVOT/AV VTI ratio: 0.29  AORTA Ao Root diam: 2.30 cm Ao Asc diam:  3.30 cm MITRAL VALVE MV Area (PHT): 2.91 cm     SHUNTS MV Area VTI:   0.70 cm     Systemic VTI:  0.15 m MV Peak grad:  6.5 mmHg     Systemic Diam: 1.90 cm MV Mean grad:  3.0 mmHg MV Vmax:       1.27 m/s MV Vmean:      78.8 cm/s MV Decel Time: 261 msec MV E velocity: 101.00 cm/s MV A velocity: 123.00 cm/s MV E/A ratio:  0.82 Kardie Tobb DO Electronically signed by Thomasene Ripple DO Signature Date/Time: 06/06/2023/3:55:36 PM    Final      PHYSICAL EXAM  Temp:  [97.8 F (36.6 C)-98.4 F (36.9 C)] 98.3 F (36.8 C) (01/19 1500) Pulse Rate:  [70-80] 73 (01/19 1500) Resp:  [18] 18 (01/19 0724) BP: (92-104)/(64-80) 98/69 (01/19 1500) SpO2:  [96 %-100 %] 100 % (01/19 1500)  General - Well nourished, well developed, in no apparent distress.  Ophthalmologic - fundi not visualized due to noncooperation.  Cardiovascular - Regular rhythm and rate.  Mental Status -  Level of arousal and orientation to time, place, and person were intact. Language exam showed occasional word finding difficulty, with hesitation of speech. Following  all simple commands. Naming 3/4 and able to repeat simple sentences but some difficulty with complex sentences  Cranial Nerves II - XII - II - Visual field intact OU, but right simultagnosia with bilateral visual stimulation. III, IV, VI - Extraocular movements intact. V - Facial sensation intact bilaterally. VII - mild right facial asymmetry VIII - Hearing & vestibular intact bilaterally. X - Palate elevates symmetrically. XI - Chin turning & shoulder shrug intact bilaterally. XII - Tongue protrusion intact.  Motor Strength - The patient's strength was normal in all extremities except mild right pronator drift and hand gripping weakness.  Bulk was normal and fasciculations were absent.   Motor Tone - Muscle tone was assessed at the neck and appendages and was normal.  Reflexes -  The patient's reflexes were symmetrical in all extremities and she had no pathological reflexes.  Sensory - Light touch, temperature/pinprick were assessed and were symmetrical.    Coordination - The patient had normal movements in the hands with no ataxia or dysmetria.  Tremor was absent.  Gait and Station - deferred.   ASSESSMENT/PLAN Ms. Shauri Ellingham is a 63 y.o. 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 No TNK given due to outside window.    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 Old left prietal and occipital encephalomalacia CTA head and neck left M2 thorombus MRI  left MCA patchy infarcts, old left occipital and b/l cerebellar infarcts CTP 49/53 2D Echo  EF 35-40%, stable TEE pending LDL 40 HgbA1c 5.9 UDS neg SCDs for VTE prophylaxis Eliquis (apixaban) daily and ASA 81 prior to admission, now back on eliquis and ASA 81.  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  Hx stroke on imaging - old left occipital and b/l cerebellar infarcts on MRI  Other Active Problems Cognitive impairment - vascular dementia CKD 3a, Cre 1.70->1.54->1.31 Flu infection - PCR + but asymptomatic GIB Thyroid cancer  Hospital day # 2   Marvel Plan, MD PhD Stroke Neurology 06/11/2023 6:06 PM    To contact Stroke Continuity provider, please refer to [email or web address]. After hours, contact General Neurology

## 2023-06-11 NOTE — Progress Notes (Signed)
Progress Note  Patient Name: [name] Date of Encounter: 06/11/2023  Primary Cardiologist: None   Subjective   No complaints at present -- she does not appreciate any residual deficits from her stroke.  Inpatient Medications    Scheduled Meds:  apixaban  5 mg Oral BID   dapagliflozin propanediol  10 mg Oral Daily   levothyroxine  100 mcg Oral QAC breakfast   metoprolol tartrate  25 mg Oral BID   rosuvastatin  20 mg Oral Daily   Continuous Infusions:  sodium chloride     PRN Meds: acetaminophen **OR** acetaminophen   Vital Signs    Vitals:   06/10/23 1942 06/10/23 2323 06/11/23 0339 06/11/23 0724  BP: 103/71 104/80 101/64 92/67  Pulse: 80 78 70 74  Resp: 18 18 18 18   Temp: 97.8 F (36.6 C) 98.3 F (36.8 C) 98.4 F (36.9 C) 98 F (36.7 C)  TempSrc: Oral Oral Oral Oral  SpO2: 99% 98% 100% 98%  Weight:      Height:        Intake/Output Summary (Last 24 hours) at 06/11/2023 1032 Last data filed at 06/11/2023 0800 Gross per 24 hour  Intake 540 ml  Output 550 ml  Net -10 ml   Filed Weights   06/09/23 1000 06/09/23 1143  Weight: 81 kg 81 kg    Telemetry    Sinus rhythm with occasional - frequent PVCs  - Personally Reviewed  ECG    1/17: SR, PVCs - Personally Reviewed  Physical Exam   GEN: No acute distress.   Neck: No JVD Cardiac: RRR, no murmurs, rubs, or gallops.  Respiratory: Clear to auscultation bilaterally. GI: Soft, nontender, non-distended  MS: No edema; No deformity. Neuro:  Nonfocal  Psych: Normal affect   Labs    Chemistry Recent Labs  Lab 06/09/23 1053 06/09/23 1154 06/10/23 0603 06/11/23 0555  NA 136 140 140 138  K 3.3* 3.3* 3.1* 4.6  CL 107 105 110 110  CO2 20*  --  18* 20*  GLUCOSE 102* 96 81 84  BUN 12 12 11 10   CREATININE 1.66* 1.70* 1.54* 1.31*  CALCIUM 8.1*  --  8.6* 8.7*  PROT 6.3*  --   --   --   ALBUMIN 3.2*  --   --   --   AST 21  --   --   --   ALT 15  --   --   --   ALKPHOS 70  --   --   --    BILITOT 0.7  --   --   --   GFRNONAA 35*  --  38* 46*  ANIONGAP 9  --  12 8     Hematology Recent Labs  Lab 06/09/23 1053 06/09/23 1154  WBC 5.2  --   RBC 4.72  --   HGB 13.6 13.9  HCT 42.5 41.0  MCV 90.0  --   MCH 28.8  --   MCHC 32.0  --   RDW 15.0  --   PLT 83*  --     Cardiac EnzymesNo results for input(s): "TROPONINI" in the last 168 hours. No results for input(s): "TROPIPOC" in the last 168 hours.   BNPNo results for input(s): "BNP", "PROBNP" in the last 168 hours.   DDimer No results for input(s): "DDIMER" in the last 168 hours.   Summary of Pertinent studies    TTE: 06/06/2023 EF 35 to 40%.  Mild asymmetric septal hypertrophy.  Status post mitral valve  repair trivial MR. "At risk for mitral stenosis"  Cardiac cath:   Imaging: Small acute infarct in the left insula and the left temporal lobe  Labs: Reviewed in Epic  Patient Profile     63 y.o. female with a hx of aortic and mitral repairs in 2010, TAVR 2019, CHFrEF, hypertension, hyperlipidemia, type 2 diabetes, TIA admitted for stroke affecting the posterior left insula and the left temporal lobe. Cardiology has been consulted to assist with medical management; patient needs permissive hypertension though she is on GDMT for chronic CHF.   Assessment & Plan    Acute stroke Eliquis is on hold after recent stroke Aspirin 81 mg daily Plan for TEE Monday; if thrombus present would plan to switch anticoagulant when safe to resume  CHF with a history of reduced EF --  EF 35-50% 06/06/2023 On metoprolol 25mg  BID instead of home crestor Sherryll Burger held for permissive hypertension Continue farxiga 10  Rheumatic heart disease status post mitral valve repair, aortic valve repair in 2010, TAVR 2019 No significant mitral stenosis S/p TAVR with normal aortic valve function  Ventricular ectopy Continue metoprolol  Influenza positive No respiratory symptoms    For questions or updates, please contact [facility] Please consult [email or web address] for contact info under Cardiology/STEMI.      Signed, Maurice Small, MD 06/11/2023, 10:32 AM

## 2023-06-11 NOTE — Plan of Care (Addendum)
TEE tomorrow, NPO at MN.   Problem: Education: Goal: Knowledge of General Education information will improve Description: Including pain rating scale, medication(s)/side effects and non-pharmacologic comfort measures Outcome: Not Met (add Reason)   Problem: Coping: Goal: Level of anxiety will decrease Outcome: Not Met (add Reason)   Problem: Elimination: Goal: Will not experience complications related to bowel motility Outcome: Not Met (add Reason) Goal: Will not experience complications related to urinary retention Outcome: Not Met (add Reason)   Problem: Pain Managment: Goal: General experience of comfort will improve and/or be controlled Outcome: Not Met (add Reason)   Problem: Safety: Goal: Ability to remain free from injury will improve Outcome: Not Met (add Reason)   Problem: Skin Integrity: Goal: Risk for impaired skin integrity will decrease Outcome: Not Met (add Reason)   Problem: Ischemic Stroke/TIA Tissue Perfusion: Goal: Complications of ischemic stroke/TIA will be minimized Outcome: Not Met (add Reason)

## 2023-06-11 NOTE — Plan of Care (Signed)

## 2023-06-11 NOTE — Assessment & Plan Note (Addendum)
Acute infarcts affecting the posterior left insula and L temporal lobe. LDL 40. A1C 5.9. TSH WNL. Echo on 1/14 unchanged from prior. Passed bedside swallow per nursing today. SLP eval states no restrictions. Risk strat labs: LDL 40. A1C 5.9, TSH nml. Has hx rheumatic fever w/ known valvular abnormalities. 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 Does have mildly decreased muscle strength of R extremities on my exam today - Neurology following - cardiology following - consider brain imaging today d/t decreased muscle strength of R extremities  - f/u TEE planned for 1/20, if no clots - plan for d/c and resume home medications per cards - Lopressor 25 mg BID in preparation for TEE - ASA 81 mg  - Permissive HTN (although pressures are normotensive to soft) - holding home eliquis/heparin drip due to size of stroke per neurology - Cardiac monitoring - Neuro checks q4h - Fall precautions - Delirium precautions - PT/OT treat

## 2023-06-12 ENCOUNTER — Other Ambulatory Visit (HOSPITAL_COMMUNITY): Payer: Self-pay

## 2023-06-12 ENCOUNTER — Inpatient Hospital Stay (HOSPITAL_COMMUNITY): Payer: Medicaid Other

## 2023-06-12 ENCOUNTER — Inpatient Hospital Stay (HOSPITAL_COMMUNITY): Payer: Self-pay | Admitting: Anesthesiology

## 2023-06-12 ENCOUNTER — Encounter (HOSPITAL_COMMUNITY)
Admission: EM | Disposition: A | Discharge status: Home / Self Care | Payer: Self-pay | Source: Home / Self Care | Attending: Family Medicine

## 2023-06-12 DIAGNOSIS — I5022 Chronic systolic (congestive) heart failure: Secondary | ICD-10-CM | POA: Diagnosis not present

## 2023-06-12 DIAGNOSIS — Z952 Presence of prosthetic heart valve: Secondary | ICD-10-CM | POA: Diagnosis not present

## 2023-06-12 DIAGNOSIS — I959 Hypotension, unspecified: Secondary | ICD-10-CM | POA: Diagnosis not present

## 2023-06-12 DIAGNOSIS — I5043 Acute on chronic combined systolic (congestive) and diastolic (congestive) heart failure: Secondary | ICD-10-CM

## 2023-06-12 DIAGNOSIS — I639 Cerebral infarction, unspecified: Secondary | ICD-10-CM

## 2023-06-12 DIAGNOSIS — I062 Rheumatic aortic stenosis with insufficiency: Secondary | ICD-10-CM

## 2023-06-12 DIAGNOSIS — I130 Hypertensive heart and chronic kidney disease with heart failure and stage 1 through stage 4 chronic kidney disease, or unspecified chronic kidney disease: Secondary | ICD-10-CM

## 2023-06-12 DIAGNOSIS — N189 Chronic kidney disease, unspecified: Secondary | ICD-10-CM

## 2023-06-12 DIAGNOSIS — I6310 Cerebral infarction due to embolism of unspecified precerebral artery: Secondary | ICD-10-CM | POA: Diagnosis not present

## 2023-06-12 DIAGNOSIS — J9601 Acute respiratory failure with hypoxia: Secondary | ICD-10-CM | POA: Diagnosis not present

## 2023-06-12 DIAGNOSIS — I63412 Cerebral infarction due to embolism of left middle cerebral artery: Secondary | ICD-10-CM | POA: Diagnosis not present

## 2023-06-12 DIAGNOSIS — Z9889 Other specified postprocedural states: Secondary | ICD-10-CM | POA: Diagnosis not present

## 2023-06-12 DIAGNOSIS — N183 Chronic kidney disease, stage 3 unspecified: Secondary | ICD-10-CM | POA: Diagnosis not present

## 2023-06-12 HISTORY — PX: TRANSESOPHAGEAL ECHOCARDIOGRAM (CATH LAB): EP1270

## 2023-06-12 LAB — BLOOD GAS, VENOUS
Acid-base deficit: 1.6 mmol/L (ref 0.0–2.0)
Bicarbonate: 25.6 mmol/L (ref 20.0–28.0)
O2 Saturation: 84.6 %
Patient temperature: 36.1
pCO2, Ven: 50 mm[Hg] (ref 44–60)
pH, Ven: 7.31 (ref 7.25–7.43)
pO2, Ven: 51 mm[Hg] — ABNORMAL HIGH (ref 32–45)

## 2023-06-12 LAB — CBC
HCT: 44.9 % (ref 36.0–46.0)
Hemoglobin: 14.5 g/dL (ref 12.0–15.0)
MCH: 28.5 pg (ref 26.0–34.0)
MCHC: 32.3 g/dL (ref 30.0–36.0)
MCV: 88.2 fL (ref 80.0–100.0)
Platelets: 141 10*3/uL — ABNORMAL LOW (ref 150–400)
RBC: 5.09 MIL/uL (ref 3.87–5.11)
RDW: 15.1 % (ref 11.5–15.5)
WBC: 10.3 10*3/uL (ref 4.0–10.5)
nRBC: 0 % (ref 0.0–0.2)

## 2023-06-12 LAB — POCT I-STAT 7, (LYTES, BLD GAS, ICA,H+H)
Acid-base deficit: 5 mmol/L — ABNORMAL HIGH (ref 0.0–2.0)
Acid-base deficit: 8 mmol/L — ABNORMAL HIGH (ref 0.0–2.0)
Bicarbonate: 23 mmol/L (ref 20.0–28.0)
Bicarbonate: 20.5 mmol/L (ref 20.0–28.0)
Calcium, Ion: 1.13 mmol/L — ABNORMAL LOW (ref 1.15–1.40)
Calcium, Ion: 1.21 mmol/L (ref 1.15–1.40)
HCT: 44 % (ref 36.0–46.0)
HCT: 42 % (ref 36.0–46.0)
Hemoglobin: 15 g/dL (ref 12.0–15.0)
Hemoglobin: 14.3 g/dL (ref 12.0–15.0)
O2 Saturation: 49 %
O2 Saturation: 98 %
Patient temperature: 97.5
Potassium: 7.2 mmol/L (ref 3.5–5.1)
Potassium: 4.1 mmol/L (ref 3.5–5.1)
Sodium: 139 mmol/L (ref 135–145)
Sodium: 140 mmol/L (ref 135–145)
TCO2: 25 mmol/L (ref 22–32)
TCO2: 22 mmol/L (ref 22–32)
pCO2 arterial: 54 mm[Hg] — ABNORMAL HIGH (ref 32–48)
pCO2 arterial: 51.8 mm[Hg] — ABNORMAL HIGH (ref 32–48)
pH, Arterial: 7.238 — ABNORMAL LOW (ref 7.35–7.45)
pH, Arterial: 7.201 — ABNORMAL LOW (ref 7.35–7.45)
pO2, Arterial: 31 mm[Hg] — CL (ref 83–108)
pO2, Arterial: 117 mm[Hg] — ABNORMAL HIGH (ref 83–108)

## 2023-06-12 LAB — COMPREHENSIVE METABOLIC PANEL
ALT: 16 U/L (ref 0–44)
AST: 26 U/L (ref 15–41)
Albumin: 3.8 g/dL (ref 3.5–5.0)
Alkaline Phosphatase: 76 U/L (ref 38–126)
Anion gap: 10 (ref 5–15)
BUN: 11 mg/dL (ref 8–23)
CO2: 20 mmol/L — ABNORMAL LOW (ref 22–32)
Calcium: 8.4 mg/dL — ABNORMAL LOW (ref 8.9–10.3)
Chloride: 108 mmol/L (ref 98–111)
Creatinine, Ser: 1.35 mg/dL — ABNORMAL HIGH (ref 0.44–1.00)
GFR, Estimated: 45 mL/min — ABNORMAL LOW (ref >60)
Glucose, Bld: 145 mg/dL — ABNORMAL HIGH (ref 70–99)
Potassium: 5.1 mmol/L (ref 3.5–5.1)
Sodium: 138 mmol/L (ref 135–145)
Total Bilirubin: 1.1 mg/dL (ref 0.0–1.2)
Total Protein: 7.3 g/dL (ref 6.5–8.1)

## 2023-06-12 LAB — BASIC METABOLIC PANEL
Anion gap: 10 (ref 5–15)
BUN: 11 mg/dL (ref 8–23)
CO2: 22 mmol/L (ref 22–32)
Calcium: 8.8 mg/dL — ABNORMAL LOW (ref 8.9–10.3)
Chloride: 106 mmol/L (ref 98–111)
Creatinine, Ser: 1.38 mg/dL — ABNORMAL HIGH (ref 0.44–1.00)
GFR, Estimated: 44 mL/min — ABNORMAL LOW (ref >60)
Glucose, Bld: 83 mg/dL (ref 70–99)
Potassium: 4 mmol/L (ref 3.5–5.1)
Sodium: 138 mmol/L (ref 135–145)

## 2023-06-12 LAB — ECHO TEE
AR max vel: 0.81 cm2
AV Area VTI: 1.02 cm2
AV Area mean vel: 0.82 cm2
AV Mean grad: 9 mm[Hg]
AV Peak grad: 18.1 mm[Hg]
Ao pk vel: 2.13 m/s
MV VTI: 1.07 cm2

## 2023-06-12 LAB — GLUCOSE, CAPILLARY: Glucose-Capillary: 90 mg/dL (ref 70–99)

## 2023-06-12 LAB — MAGNESIUM
Magnesium: 2.6 mg/dL — ABNORMAL HIGH (ref 1.7–2.4)
Magnesium: 2.6 mg/dL — ABNORMAL HIGH (ref 1.7–2.4)

## 2023-06-12 LAB — PHOSPHORUS: Phosphorus: 3.2 mg/dL (ref 2.5–4.6)

## 2023-06-12 LAB — MRSA NEXT GEN BY PCR, NASAL: MRSA by PCR Next Gen: NOT DETECTED

## 2023-06-12 SURGERY — TRANSESOPHAGEAL ECHOCARDIOGRAM (TEE) (CATHLAB)
Anesthesia: Monitor Anesthesia Care

## 2023-06-12 MED ORDER — MIDAZOLAM HCL 5 MG/5ML IJ SOLN
INTRAMUSCULAR | Status: DC | PRN
  Administered 2023-06-12: 2 mg via INTRAVENOUS

## 2023-06-12 MED ORDER — CARVEDILOL 3.125 MG PO TABS: 3.1250 mg | ORAL_TABLET | Freq: Two times a day (BID) | ORAL | Status: DC

## 2023-06-12 MED ORDER — SODIUM CHLORIDE 0.9 % IV SOLN
250.0000 mL | INTRAVENOUS | Status: AC
  Administered 2023-06-12 – 2023-06-13 (×2): 250 mL via INTRAVENOUS

## 2023-06-12 MED ORDER — CHLORHEXIDINE GLUCONATE CLOTH 2 % EX PADS
6.0000 | MEDICATED_PAD | Freq: Every day | CUTANEOUS | Status: DC
  Administered 2023-06-12 – 2023-06-16 (×5): 6 via TOPICAL

## 2023-06-12 MED ORDER — PROPOFOL 10 MG/ML IV BOLUS
INTRAVENOUS | Status: DC | PRN
  Administered 2023-06-12: 100 ug/kg/min via INTRAVENOUS
  Administered 2023-06-12: 50 mg via INTRAVENOUS
  Administered 2023-06-12: 80 mg via INTRAVENOUS
  Administered 2023-06-12: 20 mg via INTRAVENOUS

## 2023-06-12 MED ORDER — LIDOCAINE 2% (20 MG/ML) 5 ML SYRINGE
INTRAMUSCULAR | Status: DC | PRN
  Administered 2023-06-12: 80 mg via INTRAVENOUS

## 2023-06-12 MED ORDER — SUCCINYLCHOLINE CHLORIDE 200 MG/10ML IV SOSY
PREFILLED_SYRINGE | INTRAVENOUS | Status: DC | PRN
  Administered 2023-06-12: 100 mg via INTRAVENOUS

## 2023-06-12 MED ORDER — POLYETHYLENE GLYCOL 3350 17 G PO PACK: 17.0000 g | PACK | Freq: Every day | ORAL | Status: DC

## 2023-06-12 MED ORDER — BUDESONIDE 0.25 MG/2ML IN SUSP
0.2500 mg | Freq: Two times a day (BID) | RESPIRATORY_TRACT | Status: DC
  Administered 2023-06-12 – 2023-06-16 (×8): 0.25 mg via RESPIRATORY_TRACT
  Filled 2023-06-12 (×10): qty 2

## 2023-06-12 MED ORDER — ORAL CARE MOUTH RINSE: 15.0000 mL | OROMUCOSAL | Status: DC | PRN

## 2023-06-12 MED ORDER — FENTANYL CITRATE PF 50 MCG/ML IJ SOSY
50.0000 ug | PREFILLED_SYRINGE | INTRAMUSCULAR | Status: DC | PRN
Start: 2023-06-12 — End: 2023-06-14
  Administered 2023-06-12: 50 ug via INTRAVENOUS
  Administered 2023-06-13: 100 ug via INTRAVENOUS
  Filled 2023-06-12 (×2): qty 2

## 2023-06-12 MED ORDER — FAMOTIDINE 20 MG PO TABS: 20.0000 mg | ORAL_TABLET | Freq: Two times a day (BID) | ORAL | Status: DC

## 2023-06-12 MED ORDER — FENTANYL CITRATE PF 50 MCG/ML IJ SOSY
50.0000 ug | PREFILLED_SYRINGE | INTRAMUSCULAR | Status: DC | PRN
  Administered 2023-06-12 – 2023-06-13 (×2): 50 ug via INTRAVENOUS
  Filled 2023-06-12 (×2): qty 1

## 2023-06-12 MED ORDER — DEXMEDETOMIDINE HCL IN NACL 400 MCG/100ML IV SOLN
0.0000 ug/kg/h | INTRAVENOUS | Status: DC
  Administered 2023-06-12: 0.4 ug/kg/h via INTRAVENOUS
  Administered 2023-06-12 – 2023-06-13 (×3): 1 ug/kg/h via INTRAVENOUS
  Filled 2023-06-12 (×4): qty 100

## 2023-06-12 MED ORDER — CARVEDILOL 3.125 MG PO TABS
3.1250 mg | ORAL_TABLET | Freq: Two times a day (BID) | ORAL | 1 refills | Status: DC
  Filled 2023-06-12: qty 60, 30d supply, fill #0

## 2023-06-12 MED ORDER — ORAL CARE MOUTH RINSE
15.0000 mL | OROMUCOSAL | Status: DC
  Administered 2023-06-12 – 2023-06-13 (×10): 15 mL via OROMUCOSAL

## 2023-06-12 MED ORDER — ROSUVASTATIN CALCIUM 20 MG PO TABS
20.0000 mg | ORAL_TABLET | Freq: Every day | ORAL | 1 refills | Status: DC
  Filled 2023-06-12: qty 30, 30d supply, fill #0

## 2023-06-12 MED ORDER — EPINEPHRINE 1 MG/10ML IJ SOSY
PREFILLED_SYRINGE | INTRAMUSCULAR | Status: DC | PRN
  Administered 2023-06-12 (×5): 50 ug via INTRAVENOUS

## 2023-06-12 MED ORDER — MIDAZOLAM HCL 2 MG/2ML IJ SOLN
INTRAMUSCULAR | Status: AC
  Filled 2023-06-12: qty 2

## 2023-06-12 MED ORDER — ALBUTEROL SULFATE HFA 108 (90 BASE) MCG/ACT IN AERS
INHALATION_SPRAY | RESPIRATORY_TRACT | Status: DC | PRN
Start: 2023-06-12 — End: 2023-06-12
  Administered 2023-06-12 (×2): 6 via RESPIRATORY_TRACT

## 2023-06-12 MED ORDER — SODIUM BICARBONATE 8.4 % IV SOLN
50.0000 meq | Freq: Once | INTRAVENOUS | Status: AC
  Administered 2023-06-12: 50 meq via INTRAVENOUS
  Filled 2023-06-12: qty 50

## 2023-06-12 MED ORDER — DOCUSATE SODIUM 50 MG/5ML PO LIQD: 100.0000 mg | Freq: Two times a day (BID) | ORAL | Status: DC

## 2023-06-12 MED ORDER — PHENYLEPHRINE HCL (PRESSORS) 10 MG/ML IV SOLN
INTRAVENOUS | Status: DC | PRN
  Administered 2023-06-12: 160 ug via INTRAVENOUS
  Administered 2023-06-12: 240 ug via INTRAVENOUS
  Administered 2023-06-12: 160 ug via INTRAVENOUS

## 2023-06-12 MED ORDER — PHENYLEPHRINE HCL-NACL 20-0.9 MG/250ML-% IV SOLN
INTRAVENOUS | Status: DC | PRN
  Administered 2023-06-12: 60 ug/min via INTRAVENOUS

## 2023-06-12 MED ORDER — DAPAGLIFLOZIN PROPANEDIOL 10 MG PO TABS
10.0000 mg | ORAL_TABLET | Freq: Every day | ORAL | 1 refills | Status: DC
  Filled 2023-06-12: qty 30, 30d supply, fill #0

## 2023-06-12 MED ORDER — PHENYLEPHRINE HCL-NACL 20-0.9 MG/250ML-% IV SOLN
25.0000 ug/min | INTRAVENOUS | Status: DC
  Administered 2023-06-12: 50 ug/min via INTRAVENOUS

## 2023-06-12 MED ORDER — STROKE: EARLY STAGES OF RECOVERY BOOK
Freq: Once | Status: AC
  Filled 2023-06-12: qty 1

## 2023-06-12 MED ORDER — ENOXAPARIN SODIUM 40 MG/0.4ML IJ SOSY
40.0000 mg | PREFILLED_SYRINGE | Freq: Once | INTRAMUSCULAR | Status: AC
  Administered 2023-06-12: 40 mg via SUBCUTANEOUS
  Filled 2023-06-12: qty 0.4

## 2023-06-12 MED ORDER — DEXAMETHASONE SODIUM PHOSPHATE 10 MG/ML IJ SOLN
INTRAMUSCULAR | Status: DC | PRN
  Administered 2023-06-12: 10 mg via INTRAVENOUS

## 2023-06-12 MED ORDER — ROCURONIUM BROMIDE 100 MG/10ML IV SOLN
INTRAVENOUS | Status: DC | PRN
  Administered 2023-06-12: 60 mg via INTRAVENOUS

## 2023-06-12 MED ORDER — ARFORMOTEROL TARTRATE 15 MCG/2ML IN NEBU
15.0000 ug | INHALATION_SOLUTION | Freq: Two times a day (BID) | RESPIRATORY_TRACT | Status: DC
  Administered 2023-06-12 – 2023-06-16 (×8): 15 ug via RESPIRATORY_TRACT
  Filled 2023-06-12 (×10): qty 2

## 2023-06-12 NOTE — Significant Event (Addendum)
During TEE, patient became hypoxic as soon as TEE was placed, SpO2 fell to 60s.  Probe was withdrawn and SpO2 improved.  She subsequently had episode of emesis of clear fluid.  Anesthesia was present and she was intubated.  After intubation, SpO2 initially improved and TEE probe was again placed.  Abbreviated TEE procedure was completed but she again became hypoxic and probe was withdrawn.  SpO2 was down to 50s despite 100% FiO2.    Decreased breath sounds noted on left.  Endotracheal tube was withdrawn with improvement in SpO2.  She did require epi gtt for hypotension.  Bronchoscopy was done by anesthesia. PCCM was called and patient was accepted to 4[facility] ICU.  Suspect patient's initial hypoxia was due to bronchospasm or laryngospasm as TEE probe was placed.  Subsequently had likely aspiration event and suspected right mainstem intubation that was leading to further hypoxia.  Little Ishikawa, MD

## 2023-06-12 NOTE — Transfer of Care (Signed)
Immediate Anesthesia Transfer of Care Note  Patient: Samara Betker  Procedure(s) Performed: TRANSESOPHAGEAL ECHOCARDIOGRAM  Patient Location: ICU  Anesthesia Type:General  Level of Consciousness: Patient remains intubated per anesthesia plan  Airway & Oxygen Therapy: Patient remains intubated per anesthesia plan and Patient placed on Ventilator (see vital sign flow sheet for setting)  Post-op Assessment: Report given to RN and Post -op Vital signs reviewed and stable  Post vital signs: Reviewed and stable  Last Vitals:  Vitals Value Taken Time  BP 102/74 06/12/23 1446  Temp    Pulse 89 06/12/23 1445  Resp 24 06/12/23 1450  SpO2 94 % 06/12/23 1445  Vitals shown include unfiled device data.  Last Pain:  Vitals:   06/12/23 1306  TempSrc: Temporal  PainSc:          Complications: No notable events documented.

## 2023-06-12 NOTE — Progress Notes (Signed)
  Echocardiogram Echocardiogram Transesophageal has been performed.  [name] 06/12/2023, 2:38 PM

## 2023-06-12 NOTE — H&P (View-Only) (Signed)
Cardiology Progress Note  Patient ID: [name] MRN: [id number] DOB: 28-Aug-1960 Date of Encounter: 06/12/2023 Primary Cardiologist: None  Subjective   Chief Complaint: None.   HPI: Plans for transesophageal echocardiogram today.  No complaints.  No strokelike symptoms.  ROS:  All other ROS reviewed and negative. Pertinent positives noted in the HPI.     Telemetry  Overnight telemetry shows sinus rhythm 80s., which I personally reviewed.   Physical Exam   Vitals:   06/11/23 2021 06/11/23 2352 06/12/23 0333 06/12/23 0843  BP: 114/77 114/79 102/68 110/75  Pulse: 78 73 70 76  Resp: 16 16 16 16   Temp: 98 F (36.7 C) 98.2 F (36.8 C) 98 F (36.7 C) 97.9 F (36.6 C)  TempSrc: Oral Oral Oral Oral  SpO2: 100% 100% 100% 100%  Weight:      Height:        Intake/Output Summary (Last 24 hours) at 06/12/2023 0910 Last data filed at 06/12/2023 0600 Gross per 24 hour  Intake 127.14 ml  Output 200 ml  Net -72.86 ml       06/09/2023   11:43 AM 06/09/2023   10:00 AM 05/04/2023    1:23 PM  Last 3 Weights  Weight (lbs) 178 lb 9.2 oz 178 lb 9.2 oz 181 lb 3.2 oz  Weight (kg) 81 kg 81 kg 82.192 kg    Body mass index is 38.64 kg/m.  General: Well nourished, well developed, in no acute distress Head: Atraumatic, normal size  Eyes: PEERLA, EOMI  Neck: Supple, no JVD Endocrine: No thryomegaly Cardiac: Normal S1, S2; RRR; no murmurs, rubs, or gallops Lungs: Clear to auscultation bilaterally, no wheezing, rhonchi or rales  Abd: Soft, nontender, no hepatomegaly  Ext: No edema, pulses 2+ Musculoskeletal: No deformities, BUE and BLE strength normal and equal Skin: Warm and dry, no rashes   Neuro: Alert and oriented to person, place, time, and situation, CNII-XII grossly intact, no focal deficits  Psych: Normal mood and affect   Cardiac Studies  TTE 06/06/2023  1. Left ventricular ejection fraction, by estimation, is 35 to 40%. The  left ventricle has moderately decreased  function. The left ventricle  demonstrates regional wall motion abnormalities (see scoring  diagram/findings for description). There is mild  asymmetric left ventricular hypertrophy of the septal segment. Left  ventricular diastolic function could not be evaluated. Elevated left  ventricular end-diastolic pressure.   2. Right ventricular systolic function is normal. The right ventricular  size is normal. Tricuspid regurgitation signal is inadequate for assessing  PA pressure.   3. The mitral valve has been repaired/replaced. Trivial mitral valve  regurgitation. At risk for mild mitral mitral stenosis. There is a 26 mm  present in the mitral position. Procedure Date: 12/2008.   4. The aortic valve is normal in structure. Aortic valve regurgitation is  not visualized. No aortic stenosis is present.   5. The inferior vena cava is normal in size with greater than 50%  respiratory variability, suggesting right atrial pressure of 3 mmHg.   Patient Profile  [name] is a 63 y.o. 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 -Interestingly she is on Eliquis due to history of leaflet thrombosis for TAVR prosthesis.  She is also  on aspirin 81 mg daily. -Her MRI and CT scan show acute left M2 branch stroke but also has small vessel disease. -Plan is for transesophageal echo today. -Unclear etiology of stroke.  She will have a transesophageal echo today.  Mean gradient across the TAVR prosthesis is normal and stable at 12 mmHg. -We may want to consider transition to Pradaxa or Coumadin pending results of transesophageal echo.  # Chronic systolic heart failure, EF 35 to 40% -BP has been a bit low.   GDMT has been held.  Avoiding hypotension post stroke. -Stop metoprolol to tartrate.  Add Coreg 3.25 mg twice daily. -Continue Farxiga 10 mg daily. -Add back Entresto and Aldactone as you are able. -No signs of volume overload.  # Status post TAVR -History of leaflet thrombosis.  On Eliquis.  Now with stroke.  This is concerning.  May consider transition to Coumadin.  Awaiting results of echo.  # Mitral valve repair -Stable gradients.  Transesophageal echo is pending.  # CKD IIIa -Stable kidney function.  For questions or updates, please contact [facility] Please consult [email or web address] for contact info under    Signed, Gerri Spore T. Flora Lipps, MD, [facility]  [facility]  06/12/2023 9:10 AM

## 2023-06-12 NOTE — Assessment & Plan Note (Addendum)
Euvolemic on exam. Caution restarting GDMT in setting of recent CVA and soft blood pressures. Holding Entresto, carvedilol, spironolactone. - Cardiology consulted, appreciate recommendations - Restart GDMT cautiously; restarted Farxiga and Carvedilol

## 2023-06-12 NOTE — Anesthesia Postprocedure Evaluation (Signed)
Anesthesia Post Note  Patient: Warehouse manager  Procedure(s) Performed: TRANSESOPHAGEAL ECHOCARDIOGRAM     Patient location during evaluation: PACU Anesthesia Type: MAC Level of consciousness: awake and alert Pain management: pain level controlled Vital Signs Assessment: post-procedure vital signs reviewed and stable Respiratory status: spontaneous breathing, nonlabored ventilation, respiratory function stable and patient connected to nasal cannula oxygen Cardiovascular status: stable and blood pressure returned to baseline Postop Assessment: no apparent nausea or vomiting Anesthetic complications: no   No notable events documented.  Last Vitals:  Vitals:   06/12/23 2326 06/12/23 2330  BP:  (!) 120/94  Pulse: 86 85  Resp: (!) 31 (!) 31  Temp:    SpO2: 100%     Last Pain:  Vitals:   06/12/23 2000  TempSrc: Axillary  PainSc:                  Adib Wahba

## 2023-06-12 NOTE — Progress Notes (Signed)
eLink Physician-Brief Progress Note Patient Name: [name] DOB: April 27, 1961 MRN: [id number]   Date of Service  06/12/2023  HPI/Events of Note  Txf to ICU s/p TEE. Finishing proceduure she developed resp distress w/ hypoxia and was emergently intubated. Thought to be from bronchospasm, there was concern for aspiration   CxR was done after intubation. Did not show free air in mediastinum or sub cutaneous emphysema.   eICU Interventions  Will hold eliquis for tonight, will not place NG tube for now. Possibly going for extubation in AM. Has s/p AVR/MVR. Need eliquis , if not extubated, need to go on heparin or full dose lovenox.  For now one dose of lovenox 40 sq.      Intervention Category Intermediate Interventions: Other:  Ranee Gosselin 06/12/2023, 9:48 PM

## 2023-06-12 NOTE — Progress Notes (Signed)
STROKE TEAM PROGRESS NOTE   SUBJECTIVE (INTERVAL HISTORY) Speech therapist is at the bedside.  Pt lying in bed, waking up from nap, still pending TEE today.  On Eliquis.   OBJECTIVE Temp:  [97 F (36.1 C)-98.2 F (36.8 C)] 97 F (36.1 C) (01/20 1555) Pulse Rate:  [69-89] 69 (01/20 1700) Cardiac Rhythm: Normal sinus rhythm (01/20 1449) Resp:  [16-38] 31 (01/20 1715) BP: (82-141)/(63-113) 140/105 (01/20 1715) SpO2:  [95 %-100 %] 100 % (01/20 1700) FiO2 (%):  [60 %-100 %] 60 % (01/20 1532)  Recent Labs  Lab 06/09/23 1054 06/12/23 0840  GLUCAP 96 90   Recent Labs  Lab 06/09/23 1053 06/09/23 1154 06/10/23 0603 06/11/23 0555 06/12/23 0453 06/12/23 1358 06/12/23 1525  NA 136 140 140 138 138 139 140  K 3.3* 3.3* 3.1* 4.6 4.0 7.2* 4.1  CL 107 105 110 110 106  --   --   CO2 20*  --  18* 20* 22  --   --   GLUCOSE 102* 96 81 84 83  --   --   BUN 12 12 11 10 11   --   --   CREATININE 1.66* 1.70* 1.54* 1.31* 1.38*  --   --   CALCIUM 8.1*  --  8.6* 8.7* 8.8*  --   --   MG  --   --   --  2.8* 2.6*  --   --    Recent Labs  Lab 06/09/23 1053  AST 21  ALT 15  ALKPHOS 70  BILITOT 0.7  PROT 6.3*  ALBUMIN 3.2*   Recent Labs  Lab 06/09/23 1053 06/09/23 1154 06/12/23 1358 06/12/23 1525  WBC 5.2  --   --   --   NEUTROABS 3.2  --   --   --   HGB 13.6 13.9 15.0 14.3  HCT 42.5 41.0 44.0 42.0  MCV 90.0  --   --   --   PLT 83*  --   --   --    No results for input(s): "CKTOTAL", "CKMB", "CKMBINDEX", "TROPONINI" in the last 168 hours. No results for input(s): "LABPROT", "INR" in the last 72 hours.  No results for input(s): "COLORURINE", "LABSPEC", "PHURINE", "GLUCOSEU", "HGBUR", "BILIRUBINUR", "KETONESUR", "PROTEINUR", "UROBILINOGEN", "NITRITE", "LEUKOCYTESUR" in the last 72 hours.  Invalid input(s): "APPERANCEUR"     Component Value Date/Time   CHOL 85 06/10/2023 0603   TRIG 96 06/10/2023 0603   HDL 26 (L) 06/10/2023 0603   CHOLHDL 3.3 06/10/2023 0603   VLDL 19  06/10/2023 0603   LDLCALC 40 06/10/2023 0603   Lab Results  Component Value Date   HGBA1C 5.9 (H) 06/10/2023      Component Value Date/Time   LABOPIA NONE DETECTED 06/10/2023 1210   COCAINSCRNUR NONE DETECTED 06/10/2023 1210   LABBENZ NONE DETECTED 06/10/2023 1210   AMPHETMU NONE DETECTED 06/10/2023 1210   THCU NONE DETECTED 06/10/2023 1210   LABBARB NONE DETECTED 06/10/2023 1210    Recent Labs  Lab 06/09/23 1130  ETH <10    I have personally reviewed the radiological images below and agree with the radiology interpretations.  EP STUDY Result Date: 06/12/2023 See surgical note for result.  DG Chest 1 View Result Date: 06/12/2023 CLINICAL DATA:  Shortness of breath EXAM: CHEST  1 VIEW COMPARISON:  11/21/2017 FINDINGS: Low position of endotracheal tube, tip approximately 1 cm above carina directed towards right mainstem bronchus. Low lung volumes with some linear opacities in the right mid and lower lung, and left  perihilar and suprahilar airspace opacities. Heart size and mediastinal contours are within normal limits. Post TAVR. Sternotomy wires. Surgical clips at the thoracic inlet. Blunting of left lateral costophrenic angle. IMPRESSION: 1. Low position of endotracheal tube, tip 1 cm above carina directed towards right mainstem bronchus. Recommend retraction by 2 cm. 2. Low lung volumes with bilateral atelectasis and/or infiltrates. Electronically Signed   By: Corlis Leak M.D.   On: 06/12/2023 15:45   ECHO TEE Result Date: 06/12/2023    TRANSESOPHOGEAL ECHO REPORT   Patient Name:   [name] Date of Exam: 06/12/2023 Medical Rec #:  [id number]        Height:       57.0 in Accession #:    [id number]       Weight:       178.6 lb Date of Birth:  04-Dec-1960        BSA:          1.714 m Patient Age:    62 years         BP:           104/84 mmHg Patient Gender: F                HR:           84 bpm. Exam Location:  Inpatient Procedure: Transesophageal Echo, Cardiac Doppler, Color Doppler  and 3D Echo Indications:     stroke  History:         Patient has prior history of Echocardiogram examinations, most                  recent 06/06/2023. CHF, Pulmonary HTN and chronic kidney                  disease. Rheumatic heart disease.; Risk Factors:Diabetes,                  Former Smoker and Hypertension.                  Aortic Valve: 23 mm Sapien prosthetic, stented (TAVR) valve is                  present in the aortic position. Procedure Date: 11/21/17.                  Mitral Valve: 26 mm Sorin Memo prosthetic annuloplasty ring                  valve is present in the mitral position. Procedure Date:                  12/23/08.  Sonographer:     Delcie Roch RDCS Referring Phys:  [id number] RHONDA G BARRETT Diagnosing Phys: Epifanio Lesches MD PROCEDURE: After discussion of the risks and benefits of a TEE, an informed consent was obtained from the patient. The patient was intubated. The transesophogeal probe was passed without difficulty through the esophogus of the patient. Imaged were obtained with the patient in a supine position. Sedation performed by different physician. The patient was monitored while under deep sedation. The patient developed Respiratory depression during the procedure.  IMPRESSIONS  1. Left ventricular ejection fraction, by estimation, is 35 to 40%. The left ventricle has moderately decreased function.  2. Right ventricular systolic function is mildly reduced. The right ventricular size is normal.  3. No left atrial/left atrial appendage thrombus was detected.  4. The mitral valve has been repaired/replaced. Trivial mitral valve  regurgitation. The mean mitral valve gradient is 6.0 mmHg with average heart rate of 80 bpm. There is a 26 mm Sorin Memo prosthetic annuloplasty ring present in the mitral position. Procedure Date: 12/23/08.  5. The aortic valve has been repaired/replaced. Aortic valve regurgitation is trivial. There is a 23 mm Sapien prosthetic (TAVR) valve present in the  aortic position. Procedure Date: 11/21/17. Aortic valve mean gradient measures 9.0 mmHg. Conclusion(s)/Recommendation(s): Abbreviated study as patient developed significant hypoxia and procedure was aborted. Bubble study was not done. No clear source of embolism seen. 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 FINDINGS  Left Ventricle: Left ventricular ejection fraction, by estimation, is 35 to 40%. The left ventricle has moderately decreased function. The left ventricular internal cavity size was normal in size. Right Ventricle: The right ventricular size is normal. Right vetricular wall thickness was not well visualized. Right ventricular systolic function is mildly reduced. Left Atrium: Left atrial size was normal in size. No left atrial/left atrial appendage thrombus was detected. Right Atrium: Right atrial size was normal in size. Pericardium: There is no evidence of pericardial effusion. Mitral Valve: The mitral valve has been repaired/replaced. Trivial mitral valve regurgitation. There is a 26 mm Sorin Memo prosthetic annuloplasty ring present in the mitral position. Procedure Date: 12/23/08. MV peak gradient, 10.2 mmHg. The mean mitral valve gradient is 6.0 mmHg with average heart rate of 80 bpm. Tricuspid Valve: The tricuspid valve is normal in structure. Tricuspid valve regurgitation is trivial. Aortic Valve: The aortic valve has been repaired/replaced. Aortic valve regurgitation is trivial. Aortic valve mean gradient measures 9.0 mmHg. Aortic valve peak gradient measures 18.1 mmHg. Aortic valve area, by VTI measures 1.02 cm. There is a 23 mm Sapien prosthetic, stented (TAVR) valve present in the aortic position. Procedure Date: 11/21/17. Pulmonic Valve: The pulmonic valve was not well visualized. Pulmonic valve regurgitation is not visualized. Aorta: The  aortic root and ascending aorta are structurally normal, with no evidence of dilitation. IAS/Shunts: No atrial level shunt detected by color flow Doppler.  LEFT VENTRICLE PLAX 2D LVOT diam:     1.80 cm LV SV:         37 LV SV Index:   21 LVOT Area:     2.54 cm  AORTIC VALVE AV Area (Vmax):    0.81 cm AV Area (Vmean):   0.82 cm AV Area (VTI):     1.02 cm AV Vmax:           213.00 cm/s AV Vmean:          135.000 cm/s AV VTI:            0.360 m AV Peak Grad:      18.1 mmHg AV Mean Grad:      9.0 mmHg LVOT Vmax:         68.00 cm/s LVOT Vmean:        43.500 cm/s LVOT VTI:          0.144 m LVOT/AV VTI ratio: 0.40 MITRAL VALVE MV Area VTI:  1.07 cm    SHUNTS MV Peak grad: 10.2 mmHg   Systemic VTI:  0.14 m MV Mean grad: 6.0 mmHg    Systemic Diam: 1.80 cm MV Vmax:      1.60 m/s MV Vmean:     112.0 cm/s Epifanio Lesches MD Electronically signed by Epifanio Lesches MD Signature Date/Time:  06/12/2023/3:04:41 PM    Final    MR BRAIN WO CONTRAST Result Date: 06/10/2023 CLINICAL DATA:  Acute neurologic deficit EXAM: MRI HEAD WITHOUT CONTRAST TECHNIQUE: Multiplanar, multiecho pulse sequences of the brain and surrounding structures were obtained without intravenous contrast. COMPARISON:  12/10/2007 FINDINGS: Brain: There is a small acute infarcts affecting the posterior left insula and the left temporal lobe. Chronic microhemorrhage in the left frontal lobe. There is multifocal hyperintense T2-weighted signal within the white matter. Parenchymal volume and CSF spaces are normal. 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 Vascular: Normal flow voids. Skull and upper cervical spine: Normal calvarium and skull base. Visualized upper cervical spine and soft tissues are normal. Sinuses/Orbits:No paranasal sinus fluid levels or advanced mucosal thickening. No mastoid or middle ear effusion. Normal orbits. IMPRESSION: 1. Small acute infarcts affecting the posterior left insula and  the left temporal lobe. No hemorrhage or mass effect. 2. 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 Electronically Signed   By: Deatra Robinson M.D.   On: 06/10/2023 02:50   CT ANGIO HEAD NECK W WO CM W PERF (CODE STROKE) Result Date: 06/09/2023 CLINICAL DATA:  Neuro deficit, acute, stroke suspected. Slurred speech. Facial droop. EXAM: CT ANGIOGRAPHY HEAD AND NECK CT PERFUSION BRAIN TECHNIQUE: Multidetector CT imaging of the head and neck was performed using the standard protocol during bolus administration of intravenous contrast. Multiplanar CT image reconstructions and MIPs were obtained to evaluate the vascular anatomy. Carotid stenosis measurements (when applicable) are obtained utilizing NASCET criteria, using the distal internal carotid diameter as the denominator. Multiphase CT imaging of the brain was performed following IV bolus contrast injection. Subsequent parametric perfusion maps were calculated using RAPID software. RADIATION DOSE REDUCTION: This exam was performed according to the departmental dose-optimization program which includes automated exposure control, adjustment of the mA and/or kV according to patient size and/or use of iterative reconstruction technique. CONTRAST:  OMNIPAQUE IOHEXOL 350 MG/ML SOLN COMPARISON:  None Available. Head CT immediately prior FINDINGS: CTA NECK FINDINGS Aortic arch: Normal appearance with normal branching pattern. Right carotid system: Common carotid artery is tortuous but widely patent to the bifurcation. No bifurcation atherosclerotic disease. Cervical ICA widely patent. Left carotid system: Left common carotid artery widely patent but tortuous. No carotid bifurcation disease. Cervical ICA is normal. Vertebral arteries: Right vertebral artery takes an early origin from the proximal subclavian artery and is then widely patent through the cervical region to the foramen magnum. Left vertebral artery origin is  normal and the vessel appears normal through the cervical region. Skeleton: Ordinary cervical spondylosis. Other neck: No mass or lymphadenopathy. Previous thyroidectomy. Upper chest: Mild scarring at the left lung apex. No apparent change since June of 2023. Review of the MIP images confirms the above findings CTA HEAD FINDINGS Anterior circulation: Both internal carotid arteries are widely patent through the skull base and siphon regions. Minimal siphon atherosclerotic calcification but no stenosis. Anterior cerebral arteries are patent and normal. Right middle cerebral artery is normal. Focal embolus within the left inferior division M2 branch with diminished distal perfusion. Posterior circulation: Both vertebral arteries widely patent through the foramen magnum to the basilar artery. Diminutive but widely patent basilar artery. Posterior circulation branch vessels are normal. Dominant anterior circulation supply of the right PCA. Venous sinuses: Patent and normal. Anatomic variants: None other significant. Review of the MIP images confirms the above findings CT Brain Perfusion Findings: ASPECTS: 10 CBF (<30%) Volume: 49mL Perfusion (Tmax>6.0s)  volume: 53mL Mismatch Volume: 4mL Infarction Location:Left parietal lobe. IMPRESSION: 1. Focal embolus within the left inferior division M2 branch with diminished distal perfusion. 2. 49 mL of core infarct in the left parietal lobe. 53 mL of ischemic penumbra. 4 mL of mismatch. 3. No other significant vascular finding. Tortuous common carotid arteries bilaterally. No carotid bifurcation disease. 4. Early origin of the right vertebral artery from the proximal subclavian artery. This is a normal anatomic variant. 5. These results were communicated to Dr. Amada Jupiter at 11:45 am on 06/09/2023 by text page via the [facility] messaging system. Electronically Signed   By: Paulina Fusi M.D.   On: 06/09/2023 11:45   CT HEAD CODE STROKE WO CONTRAST Result Date: 06/09/2023 CLINICAL  DATA:  Code stroke. Slurred speech, right-sided weakness, facial droop EXAM: CT HEAD WITHOUT CONTRAST TECHNIQUE: Contiguous axial images were obtained from the base of the skull through the vertex without intravenous contrast. RADIATION DOSE REDUCTION: This exam was performed according to the departmental dose-optimization program which includes automated exposure control, adjustment of the mA and/or kV according to patient size and/or use of iterative reconstruction technique. COMPARISON:  03/06/2012 CT head FINDINGS: Brain: No evidence of acute infarction, hemorrhage, mass, mass effect, or midline shift. No hydrocephalus or extra-axial collection. Redemonstrated encephalomalacia in the left parietal and occipital lobes. Advanced atrophy for age. Partial empty sella. Normal craniocervical junction. Vascular: Minimal hyperdensity in the left insular M2 (series 3, image 14). No other hyperdense vessel. Atherosclerotic calcifications in the intracranial carotid arteries. Skull: Negative for fracture or focal lesion. Sinuses/Orbits: Mild mucosal thickening in the paranasal sinuses. No acute finding in the orbits. Other: The mastoid air cells are well aerated. ASPECTS [facility] Stroke Program Early CT Score) - Ganglionic level infarction (caudate, lentiform nuclei, internal capsule, insula, M1-M3 cortex): 7 - Supraganglionic infarction (M4-M6 cortex): 3 Total score (0-10 with 10 being normal): 10 IMPRESSION: 1. No acute intracranial hemorrhage. ASPECTS is 10. 2. Minimal hyperdensity in the left insular M2, which could represent thrombus. 3. Redemonstrated encephalomalacia in the left parietal and occipital lobes. Imaging results were communicated on 06/09/2023 at 11:10 am to provider Dr. Amada Jupiter via secure text paging. Electronically Signed   By: Wiliam Ke M.D.   On: 06/09/2023 11:10   ECHOCARDIOGRAM COMPLETE Result Date: 06/06/2023    ECHOCARDIOGRAM REPORT   Patient Name:   [name] Date of Exam:  06/06/2023 Medical Rec #:  [id number]        Height:       57.0 in Accession #:    [id number]       Weight:       181.2 lb Date of Birth:  23-Oct-1960        BSA:          1.725 m Patient Age:    62 years         BP:           96/62 mmHg Patient Gender: F                HR:           72 bpm. Exam Location:  Outpatient Procedure: 2D Echo, Intracardiac Opacification Agent, Color Doppler and Cardiac            Doppler Indications:    CHF  History:        Patient has prior history of Echocardiogram examinations, most                 recent 05/25/2022.  CHF and Cardiomyopathy, S/P TAVR, Pulmonary                 HTN; Risk Factors:Hypertension.                  Mitral Valve: 26 mm valve is present in the mitral position.                 Procedure Date: 12/2008.  Sonographer:    Webb Laws Referring Phys: [id number] [place] LEE IMPRESSIONS  1. Left ventricular ejection fraction, by estimation, is 35 to 40%. The left ventricle has moderately decreased function. The left ventricle demonstrates regional wall motion abnormalities (see scoring diagram/findings for description). There is mild asymmetric left ventricular hypertrophy of the septal segment. Left ventricular diastolic function could not be evaluated. Elevated left ventricular end-diastolic pressure.  2. Right ventricular systolic function is normal. The right ventricular size is normal. Tricuspid regurgitation signal is inadequate for assessing PA pressure.  3. The mitral valve has been repaired/replaced. Trivial mitral valve regurgitation. At risk for mild mitral mitral stenosis. There is a 26 mm present in the mitral position. Procedure Date: 12/2008.  4. The aortic valve is normal in structure. Aortic valve regurgitation is not visualized. No aortic stenosis is present.  5. The inferior vena cava is normal in size with greater than 50% respiratory variability, suggesting right atrial pressure of 3 mmHg. FINDINGS  Left Ventricle: Left ventricular ejection fraction, by  estimation, is 35 to 40%. The left ventricle has moderately decreased function. The left ventricle demonstrates regional wall motion abnormalities. The left ventricular internal cavity size was normal in size. There is mild asymmetric left ventricular hypertrophy of the septal segment. Left ventricular diastolic function could not be evaluated due to mitral valve repair. Left ventricular diastolic function could not be evaluated. Elevated left ventricular end-diastolic pressure.  LV Wall Scoring: The entire apex is akinetic. The anterior wall, antero-lateral wall, anterior septum, inferior wall, posterior wall, mid inferoseptal segment, and basal inferoseptal segment are hypokinetic. Right Ventricle: The right ventricular size is normal. No increase in right ventricular wall thickness. Right ventricular systolic function is normal. Tricuspid regurgitation signal is inadequate for assessing PA pressure. Left Atrium: Left atrial size was normal in size. Right Atrium: Right atrial size was normal in size. Pericardium: There is no evidence of pericardial effusion. Presence of epicardial fat layer. Mitral Valve: The mitral valve has been repaired/replaced. Trivial mitral valve regurgitation. There is a 26 mm present in the mitral position. Procedure Date: 12/2008. At risk for mild mitral mitral valve stenosis. MV peak gradient, 6.5 mmHg. The mean mitral valve gradient is 3.0 mmHg. Tricuspid Valve: The tricuspid valve is normal in structure. Tricuspid valve regurgitation is not demonstrated. No evidence of tricuspid stenosis. Aortic Valve: The aortic valve is normal in structure. Aortic valve regurgitation is not visualized. No aortic stenosis is present. Aortic valve mean gradient measures 12.6 mmHg. Aortic valve peak gradient measures 21.6 mmHg. Aortic valve area, by VTI measures 0.81 cm. Pulmonic Valve: The pulmonic valve was normal in structure. Pulmonic valve regurgitation is not visualized. No evidence of pulmonic  stenosis. Aorta: The aortic root is normal in size and structure. Venous: The inferior vena cava is normal in size with greater than 50% respiratory variability, suggesting right atrial pressure of 3 mmHg. IAS/Shunts: No atrial level shunt detected by color flow Doppler.  LEFT VENTRICLE PLAX 2D LVIDd:         5.30 cm      Diastology LVIDs:  4.40 cm      LV e' medial:    4.57 cm/s LV PW:         0.70 cm      LV E/e' medial:  22.1 LV IVS:        1.10 cm      LV e' lateral:   4.46 cm/s LVOT diam:     1.90 cm      LV E/e' lateral: 22.6 LV SV:         41 LV SV Index:   24 LVOT Area:     2.84 cm  LV Volumes (MOD) LV vol d, MOD A2C: 126.0 ml LV vol d, MOD A4C: 153.0 ml LV vol s, MOD A2C: 83.3 ml LV vol s, MOD A4C: 100.0 ml LV SV MOD A2C:     42.7 ml LV SV MOD A4C:     153.0 ml LV SV MOD BP:      50.9 ml RIGHT VENTRICLE RV Basal diam:  2.40 cm RV S prime:     4.90 cm/s LEFT ATRIUM             Index        RIGHT ATRIUM          Index LA diam:        4.90 cm 2.84 cm/m   RA Area:     6.02 cm LA Vol (A2C):   33.5 ml 19.42 ml/m  RA Volume:   7.74 ml  4.49 ml/m LA Vol (A4C):   38.7 ml 22.44 ml/m LA Biplane Vol: 38.3 ml 22.21 ml/m  AORTIC VALVE AV Area (Vmax):    0.81 cm AV Area (Vmean):   0.76 cm AV Area (VTI):     0.81 cm AV Vmax:           232.40 cm/s AV Vmean:          166.200 cm/s AV VTI:            0.511 m AV Peak Grad:      21.6 mmHg AV Mean Grad:      12.6 mmHg LVOT Vmax:         66.10 cm/s LVOT Vmean:        44.700 cm/s LVOT VTI:          0.146 m LVOT/AV VTI ratio: 0.29  AORTA Ao Root diam: 2.30 cm Ao Asc diam:  3.30 cm MITRAL VALVE MV Area (PHT): 2.91 cm     SHUNTS MV Area VTI:   0.70 cm     Systemic VTI:  0.15 m MV Peak grad:  6.5 mmHg     Systemic Diam: 1.90 cm MV Mean grad:  3.0 mmHg MV Vmax:       1.27 m/s MV Vmean:      78.8 cm/s MV Decel Time: 261 msec MV E velocity: 101.00 cm/s MV A velocity: 123.00 cm/s MV E/A ratio:  0.82 Kardie Tobb DO Electronically signed by Thomasene Ripple DO Signature  Date/Time: 06/06/2023/3:55:36 PM    Final      PHYSICAL EXAM  Temp:  [97 F (36.1 C)-98.2 F (36.8 C)] 97 F (36.1 C) (01/20 1555) Pulse Rate:  [69-89] 69 (01/20 1700) Resp:  [16-38] 31 (01/20 1715) BP: (82-141)/(63-113) 140/105 (01/20 1715) SpO2:  [95 %-100 %] 100 % (01/20 1700) FiO2 (%):  [60 %-100 %] 60 % (01/20 1532)  General - Well nourished, well developed, in no apparent distress.  Ophthalmologic - fundi not visualized due to  noncooperation.  Cardiovascular - Regular rhythm and rate.  Mental Status -  Level of arousal and orientation to time, place, and person were intact. Language exam showed occasional word finding difficulty, with hesitation of speech. Following all simple commands. Naming 3/4 and able to repeat simple sentences but some difficulty with complex sentences  Cranial Nerves II - XII - II - Visual field intact OU, but right simultagnosia with bilateral visual stimulation. III, IV, VI - Extraocular movements intact. V - Facial sensation intact bilaterally. VII - mild right facial asymmetry VIII - Hearing & vestibular intact bilaterally. X - Palate elevates symmetrically. XI - Chin turning & shoulder shrug intact bilaterally. XII - Tongue protrusion intact.  Motor Strength - The patient's strength was normal in all extremities except mild right pronator drift and hand gripping weakness.  Bulk was normal and fasciculations were absent.   Motor Tone - Muscle tone was assessed at the neck and appendages and was normal.  Reflexes - The patient's reflexes were symmetrical in all extremities and she had no pathological reflexes.  Sensory - Light touch, temperature/pinprick were assessed and were symmetrical.    Coordination - The patient had normal movements in the hands with no ataxia or dysmetria.  Tremor was absent.  Gait and Station - deferred.   ASSESSMENT/PLAN Ms. [name] is a 63 y.o. 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 No TNK given due to outside window.    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 Old left prietal and occipital encephalomalacia CTA head and neck left M2 thorombus MRI  left MCA patchy infarcts, old left occipital and b/l cerebellar infarcts CTP 49/53 2D Echo  EF 35-40%, stable TEE pending LDL 40 HgbA1c 5.9 UDS neg SCDs for VTE prophylaxis Eliquis (apixaban) daily and ASA 81 prior to admission, now back on eliquis and ASA 81.  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 After hours, contact General Neurology

## 2023-06-12 NOTE — CV Procedure (Signed)
     TRANSESOPHAGEAL ECHOCARDIOGRAM   NAME:  [name]   MRN: [id number] DOB:  1961-03-13   ADMIT DATE: 06/09/2023  INDICATIONS: CVA  PROCEDURE:   Informed consent was obtained prior to the procedure. The risks, benefits and alternatives for the procedure were discussed and the patient comprehended these risks.  Risks include, but are not limited to, cough, sore throat, vomiting, nausea, somnolence, esophageal and stomach trauma or perforation, bleeding, low blood pressure, aspiration, pneumonia, infection, trauma to the teeth and death.    After a procedural time-out, the oropharynx was anesthetized and the patient was sedated by the anesthesia service. The transesophageal probe was inserted in the esophagus and stomach without difficulty and multiple views were obtained. Anesthesia was monitored by Dr Tacy Dura and Eliott Nine, CRNA.    COMPLICATIONS:    During TEE, patient became hypoxic as soon as TEE was placed, SpO2 fell to 60s.  Probe was withdrawn and SpO2 improved.  She subsequently had episode of emesis of clear fluid.  Anesthesia was present and she was intubated.  After intubation, SpO2 initially improved and TEE probe was again placed.  Abbreviated TEE procedure was completed but she again became hypoxic and probe was withdrawn.  SpO2 was down to 50s despite 100% FiO2.    Decreased breath sounds noted on left.  Endotracheal tube was withdrawn with improvement in SpO2.  She did require epi gtt for hypotension.  Bronchoscopy was done by anesthesia. PCCM was called and patient was accepted to 4[facility] ICU.  Suspect patient's initial hypoxia was due to bronchospasm or laryngospasm as TEE probe was placed.  Subsequently had likely aspiration event and suspected right mainstem intubation that was leading to further hypoxia   FINDINGS:  Abbreviated study as patient developed significant hypoxia and procedure was aborted.  Bubble study was not done.  No clear source of embolism  seen.   Epifanio Lesches MD [facility]  [address] [place], [place] [address] [phone]   3:05 PM

## 2023-06-12 NOTE — Progress Notes (Signed)
Occupational Therapy Treatment Patient Details Name: [name] MRN: [id number] DOB: 1960/06/02 Today's Date: 06/12/2023   History of present illness 63 y.o. female presenting 06/09/23 with right-sided weakness, fall, slurred speech. 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 Pt able to complete ADLs with supervision, CGA for bed mobility and transfers without AD. Pt provided with foam cube for RUE strengthening. Pt able to ambulate hallway distance with min cues to locate items on R side, educated on compensatory strategy to turn head to R side to be able to thoroughly scan R side of environment. Pt still with decr cognition, educated on having daughter (present in room) assist with med mgmt tasks at home, pt and daughter verbalized understanding. Pt presenting with impairments listed below, will follow acutely. Continue to recommend HHOT at d/c.       If plan is discharge home, recommend the following:  A little help with walking and/or transfers;A little help with bathing/dressing/bathroom;Assistance with cooking/housework;Assist for transportation;Help with stairs or ramp for entrance   Equipment Recommendations  None recommended by OT    Recommendations for Other Services      Precautions / Restrictions Precautions Precautions: Fall Precaution Comments: fell when had CVA; landed on her rt side Restrictions Weight Bearing Restrictions Per Provider Order: No       Mobility Bed Mobility Overal bed mobility: Needs Assistance Bed Mobility:  Supine to Sit     Supine to sit: Contact guard          Transfers Overall transfer level: Needs assistance Equipment used: None Transfers: Sit to/from Stand Sit to Stand: Contact guard assist                 Balance Overall balance assessment: No apparent balance deficits (not formally assessed)                                         ADL either performed or assessed with clinical judgement   ADL Overall ADL's : Needs assistance/impaired Eating/Feeding: Set up   Grooming: Wash/dry hands;Standing;Supervision/safety                   Toilet Transfer: Supervision/safety;Ambulation   Toileting- Clothing Manipulation and Hygiene: Supervision/safety       Functional mobility during ADLs: Supervision/safety      Extremity/Trunk Assessment Upper Extremity Assessment RUE Deficits / Details: 4/5 ROM and strength, functional for BADL   Lower Extremity Assessment Lower Extremity Assessment: Defer to PT evaluation        Vision   Additional Comments: tracks past midline but ~50% to R side, difficulty distinguishing stimuli on R side   Perception Perception Perception: Not tested   Praxis Praxis Praxis: Not tested    Cognition Arousal: Alert Behavior During Therapy: WFL for tasks assessed/performed Overall Cognitive Status: Impaired/Different from baseline Area of Impairment: Problem solving, Memory, Awareness                     Memory: Decreased short-term memory     Awareness:  Emergent Problem Solving: Difficulty sequencing, Requires verbal cues General Comments: able to recall 1/3 words, able to do simple math equation, incr time to follow mulitstep commands        Exercises      Shoulder Instructions       General Comments VSS on RA; daughter present    Pertinent Vitals/ Pain       Pain Assessment Pain Assessment: No/denies pain  Home Living                                           Prior Functioning/Environment              Frequency  Min 1X/week        Progress Toward Goals  OT Goals(current goals can now be found in the care plan section)  Progress towards OT goals: Progressing toward goals  Acute Rehab OT Goals Patient Stated Goal: none stated OT Goal Formulation: With patient Time For Goal Achievement: 06/24/23 Potential to Achieve Goals: Good ADL Goals Pt Will Perform Upper Body Dressing: with supervision;sitting Pt Will Perform Lower Body Dressing: with supervision;sitting/lateral leans;sit to/from stand Pt/caregiver will Perform Home Exercise Program: With written HEP provided;Right Upper extremity;With Supervision Additional ADL Goal #1: pt will perform visual scanning activity with min cues to locate items on R in prep for ADLs Additional ADL Goal #2: pt will recieve passing score on pillbox assessment in prep for ADLs  Plan      Co-evaluation                 AM-PAC OT "6 Clicks" Daily Activity     Outcome Measure   Help from another person eating meals?: None Help from another person taking care of personal grooming?: None Help from another person toileting, which includes using toliet, bedpan, or urinal?: A Little Help from another person bathing (including washing, rinsing, drying)?: A Little Help from another person to put on and taking off regular upper body clothing?: A Little Help from another person to put on and taking off regular lower body clothing?: A Little 6 Click Score: 20    End of Session    OT Visit Diagnosis: Unsteadiness on feet (R26.81);Other abnormalities of gait and mobility (R26.89);Muscle weakness (generalized) (M62.81);History of falling (Z91.81);Other symptoms and signs involving the nervous system (R29.898)   Activity Tolerance Patient tolerated treatment well   Patient Left in bed;with call bell/phone within reach;with bed alarm set;with family/visitor present   Nurse Communication Mobility  status        Time: 1191-4782 OT Time Calculation (min): 21 min  Charges: OT General Charges $OT Visit: 1 Visit OT Treatments $Therapeutic Activity: 8-22 mins  Carver Fila, OTD, OTR/L SecureChat Preferred Acute Rehab [phone]   Carver Fila Koonce 06/12/2023, 9:13 AM

## 2023-06-12 NOTE — Progress Notes (Signed)
Cardiology Progress Note  Patient ID: Geneses Jain MRN: [id number] DOB: 28-Aug-1960 Date of Encounter: 06/12/2023 Primary Cardiologist: None  Subjective   Chief Complaint: None.   HPI: Plans for transesophageal echocardiogram today.  No complaints.  No strokelike symptoms.  ROS:  All other ROS reviewed and negative. Pertinent positives noted in the HPI.     Telemetry  Overnight telemetry shows sinus rhythm 80s., which I personally reviewed.   Physical Exam   Vitals:   06/11/23 2021 06/11/23 2352 06/12/23 0333 06/12/23 0843  BP: 114/77 114/79 102/68 110/75  Pulse: 78 73 70 76  Resp: 16 16 16 16   Temp: 98 F (36.7 C) 98.2 F (36.8 C) 98 F (36.7 C) 97.9 F (36.6 C)  TempSrc: Oral Oral Oral Oral  SpO2: 100% 100% 100% 100%  Weight:      Height:        Intake/Output Summary (Last 24 hours) at 06/12/2023 0910 Last data filed at 06/12/2023 0600 Gross per 24 hour  Intake 127.14 ml  Output 200 ml  Net -72.86 ml       06/09/2023   11:43 AM 06/09/2023   10:00 AM 05/04/2023    1:23 PM  Last 3 Weights  Weight (lbs) 178 lb 9.2 oz 178 lb 9.2 oz 181 lb 3.2 oz  Weight (kg) 81 kg 81 kg 82.192 kg    Body mass index is 38.64 kg/m.  General: Well nourished, well developed, in no acute distress Head: Atraumatic, normal size  Eyes: PEERLA, EOMI  Neck: Supple, no JVD Endocrine: No thryomegaly Cardiac: Normal S1, S2; RRR; no murmurs, rubs, or gallops Lungs: Clear to auscultation bilaterally, no wheezing, rhonchi or rales  Abd: Soft, nontender, no hepatomegaly  Ext: No edema, pulses 2+ Musculoskeletal: No deformities, BUE and BLE strength normal and equal Skin: Warm and dry, no rashes   Neuro: Alert and oriented to person, place, time, and situation, CNII-XII grossly intact, no focal deficits  Psych: Normal mood and affect   Cardiac Studies  TTE 06/06/2023  1. Left ventricular ejection fraction, by estimation, is 35 to 40%. The  left ventricle has moderately decreased  function. The left ventricle  demonstrates regional wall motion abnormalities (see scoring  diagram/findings for description). There is mild  asymmetric left ventricular hypertrophy of the septal segment. Left  ventricular diastolic function could not be evaluated. Elevated left  ventricular end-diastolic pressure.   2. Right ventricular systolic function is normal. The right ventricular  size is normal. Tricuspid regurgitation signal is inadequate for assessing  PA pressure.   3. The mitral valve has been repaired/replaced. Trivial mitral valve  regurgitation. At risk for mild mitral mitral stenosis. There is a 26 mm  present in the mitral position. Procedure Date: 12/2008.   4. The aortic valve is normal in structure. Aortic valve regurgitation is  not visualized. No aortic stenosis is present.   5. The inferior vena cava is normal in size with greater than 50%  respiratory variability, suggesting right atrial pressure of 3 mmHg.   Patient Profile  Elwillie Fella is a 63 y.o. 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 -Interestingly she is on Eliquis due to history of leaflet thrombosis for TAVR prosthesis.  She is also  on aspirin 81 mg daily. -Her MRI and CT scan show acute left M2 branch stroke but also has small vessel disease. -Plan is for transesophageal echo today. -Unclear etiology of stroke.  She will have a transesophageal echo today.  Mean gradient across the TAVR prosthesis is normal and stable at 12 mmHg. -We may want to consider transition to Pradaxa or Coumadin pending results of transesophageal echo.  # Chronic systolic heart failure, EF 35 to 40% -BP has been a bit low.   GDMT has been held.  Avoiding hypotension post stroke. -Stop metoprolol to tartrate.  Add Coreg 3.25 mg twice daily. -Continue Farxiga 10 mg daily. -Add back Entresto and Aldactone as you are able. -No signs of volume overload.  # Status post TAVR -History of leaflet thrombosis.  On Eliquis.  Now with stroke.  This is concerning.  May consider transition to Coumadin.  Awaiting results of echo.  # Mitral valve repair -Stable gradients.  Transesophageal echo is pending.  # CKD IIIa -Stable kidney function.  For questions or updates, please contact [facility] Please consult [email or web address] for contact info under    Signed, Gerri Spore T. Flora Lipps, MD, [facility]  [facility]  06/12/2023 9:10 AM

## 2023-06-12 NOTE — Plan of Care (Signed)
  Problem: Clinical Measurements: Goal: Ability to maintain clinical measurements within normal limits will improve Outcome: Progressing   Problem: Nutrition: Goal: Adequate nutrition will be maintained Outcome: Progressing   Problem: Activity: Goal: Risk for activity intolerance will decrease Outcome: Progressing   Problem: Elimination: Goal: Will not experience complications related to bowel motility Outcome: Progressing   Problem: Coping: Goal: Level of anxiety will decrease Outcome: Progressing   Problem: Skin Integrity: Goal: Risk for impaired skin integrity will decrease Outcome: Progressing   Problem: Pain Managment: Goal: General experience of comfort will improve and/or be controlled Outcome: Progressing   Problem: Safety: Goal: Ability to remain free from injury will improve Outcome: Progressing

## 2023-06-12 NOTE — Assessment & Plan Note (Signed)
Acute infarcts affecting the posterior left insula. Strength improved 5/5 bilateral LE/UE. LDL 40. A1C 5.9. TSH WNL. Echo on 1/14 unchanged from prior. SLP eval states no restrictions. - Neurology consulted, appreciate recommendations - Cardiology consulted, appreciate recommendations - TEE today - ASA 81 mg  - Permissive HTN (although pressures are normotensive to soft) - holding home eliquis/heparin drip due to size of stroke per neurology - Cardiac monitoring - Neuro checks q4h - Fall precautions - Delirium precautions - PT/OT treat

## 2023-06-12 NOTE — Consult Note (Signed)
NAMECedria Hale, MRN:  [id number], DOB:  Oct 19, 1960, LOS: 3 ADMISSION DATE:  06/09/2023, CONSULTATION DATE:  06/12/23 REFERRING MD:  Flora Lipps, CHIEF COMPLAINT:  resp failure    History of Present Illness:  63 yo F PMH rheumatic heart disease s/p TAVR on chronic eliquis MV repair AI, AS, HFrEF who was admitted to [facility] 1/17 with R sided weakness -- L M2 territory cva. Neuro was consulted 1/17. Cards consulted 1/18 for help in GDMT, and recommended TEE. Underwent TEE 1/20, complicated by bronchospasm and possible aspiration event, subsequently intubated.   PCCM is consulted in this setting   Pertinent  Medical History  Rheumatic heart disease S/p MV repair S/p TAVR Chronic anticoagulation  AI AS HFrEF CVA CKDIIIa   Significant Hospital Events: Including procedures, antibiotic start and stop dates in addition to other pertinent events   1/17 admitted with R sided weakness. Stroke. 1/20 Decompensated during TEE. Intubated.   Interim History / Subjective:  Intubated   Objective   Blood pressure 108/84, pulse 82, temperature (!) 97.5 F (36.4 C), temperature source Temporal, resp. rate 16, height 4\' 9"  (1.448 m), weight 81 kg, SpO2 99%.        Intake/Output Summary (Last 24 hours) at 06/12/2023 1412 Last data filed at 06/12/2023 1346 Gross per 24 hour  Intake 257.14 ml  Output 100 ml  Net 157.14 ml   Filed Weights   06/09/23 1000 06/09/23 1143  Weight: 81 kg 81 kg    Examination: General: obese middle aged female in NAD on vent HENT: [place]/AT, PERRL, no JVD Lungs: Coarse on the left. Clear on the R.  Cardiovascular: RRR, no MRG Abdomen: Soft, NT, ND Extremities: No acute deformity. No edema.  Neuro: [facility] Problem list     Assessment & Plan:   Acute respiratory failure with hypoxia: etiology not entirely clear. Perhaps TEE probe occluded airway or laryngospasm occurred initially. Then there was concern for aspiration as well.  P -CXR  ABG -VAP, PAD with dex/PRN fent -RASS goal 0 to -1 -Will try to SBT this afternoon -Pulm hygiene  Hypotension: post-procedural - Place hold parameter on coreg - Neo for now. Suspect she will be off soon.   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 On Eliquis. Now with stroke? Failed Eliquis? Chronic anticoagulation  S/p MV repair HFrEF (LVEF 35-40%) P -f/u TEE  -farxiga -pressures have been limiting ability to add entresto and aldactone-- recent coreg was added.  -consideration for pradaxa or coumadin pending TEE   CKD III - Trend chemistry and UOP  Best Practice (right click and "Reselect all SmartList Selections" daily)   Diet/type: NPO DVT prophylaxis DOAC Pressure ulcer(s): N/A GI prophylaxis: H2B Lines: N/A Foley:  N/A Code Status:  full code Last date of multidisciplinary goals of care discussion [ ]   Labs   CBC: Recent Labs  Lab 06/09/23 1053 06/09/23 1154  WBC 5.2  --   NEUTROABS 3.2  --   HGB 13.6 13.9  HCT 42.5 41.0  MCV 90.0  --   PLT 83*  --     Basic Metabolic Panel: Recent Labs  Lab 06/09/23 1053 06/09/23 1154 06/10/23 0603 06/11/23 0555 06/12/23 0453  NA 136 140 140 138 138  K 3.3* 3.3* 3.1* 4.6 4.0  CL 107 105  110 110 106  CO2 20*  --  18* 20* 22  GLUCOSE 102* 96 81 84 83  BUN 12 12 11 10 11   CREATININE 1.66* 1.70* 1.54* 1.31* 1.38*  CALCIUM 8.1*  --  8.6* 8.7* 8.8*  MG  --   --   --  2.8* 2.6*   GFR: Estimated Creatinine Clearance: 37.6 mL/min (A) (by C-G formula based on SCr of 1.38 mg/dL (H)). Recent Labs  Lab 06/09/23 1053  WBC 5.2    Liver Function Tests: Recent Labs  Lab 06/09/23 1053  AST 21  ALT 15  ALKPHOS 70  BILITOT 0.7  PROT 6.3*  ALBUMIN 3.2*   No results for input(s): "LIPASE", "AMYLASE" in the last 168 hours. No results for  input(s): "AMMONIA" in the last 168 hours.  ABG    Component Value Date/Time   PHART 7.443 11/17/2017 1410   PCO2ART 32.9 11/17/2017 1410   PO2ART 114 (H) 11/17/2017 1410   HCO3 22.2 11/17/2017 1410   TCO2 22 06/09/2023 1154   ACIDBASEDEF 1.4 11/17/2017 1410   O2SAT 98.2 11/17/2017 1410     Coagulation Profile: Recent Labs  Lab 06/09/23 1053  INR 1.6*    Cardiac Enzymes: No results for input(s): "CKTOTAL", "CKMB", "CKMBINDEX", "TROPONINI" in the last 168 hours.  HbA1C: Hgb A1c MFr Bld  Date/Time Value Ref Range Status  06/10/2023 06:03 AM 5.9 (H) 4.8 - 5.6 % Final    Comment:    (NOTE) Pre diabetes:          5.7%-6.4%  Diabetes:              >6.4%  Glycemic control for   <7.0% adults with diabetes   12/23/2022 02:15 PM 6.5 (H) 4.8 - 5.6 % Final    Comment:    (NOTE) Pre diabetes:          5.7%-6.4%  Diabetes:              >6.4%  Glycemic control for   <7.0% adults with diabetes     CBG: Recent Labs  Lab 06/09/23 1054 06/12/23 0840  GLUCAP 96 90    Review of Systems:   Patient is encephalopathic and/or intubated; therefore, history has been obtained from chart review.    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   Surgical History:   Past Surgical History:  Procedure Laterality Date   AORTIC VALVE REPAIR  August 2010   Suture plication of all 3 commissures   CARDIAC CATHETERIZATION  August 2010   Preop: nonobstructive coronary disease   CESAREAN SECTION  1981; 1996   MITRAL VALVE REPAIR  August 2010    26 mm Sorin [facility] 3D Ring Annuloplasty   RIGHT HEART CATH N/A 08/30/2016   Procedure: Right Heart Cath;  Surgeon: Laurey Morale, MD;  Location: [facility] INVASIVE CV LAB;  Service: Cardiovascular;  Laterality: N/A;   RIGHT/LEFT HEART CATH AND CORONARY ANGIOGRAPHY  N/A 07/08/2016   Procedure: Right/Left Heart Cath and Coronary Angiography;  Surgeon: Marykay Lex, MD;  Location: [facility] INVASIVE CV LAB;  Service: Cardiovascular: Normal coronaries, mean RA 18, RV pressure/EDP: 70/12/18 mmHg.  mean PA 74/39 mean 52, mean PCWP 31 (with V wave of 45 mmHg), LVEDP ~ 30 mmHg.  CI 1.98 Fick. Peak AoV gradient ~15 mHg     TEE WITHOUT CARDIOVERSION N/A 07/13/2016   Procedure: Transesophageal Echocardiogram (TEE);  Surgeon: Chrystie Nose, MD;  Location: [facility] ENDOSCOPY;  Service: Cardiovascular:  EF 25-30%, dilated LV - diffuse hypokinesis, rheumatic-appearing Aortic Vallve s/p repair with moderate AS (AVA 1.1 cm^2), moderate central AI.  MV s/p repeat with mild mitral stenosis and eccentric severe MR.     TEE WITHOUT CARDIOVERSION     TEE WITHOUT CARDIOVERSION N/A 06/28/2017   Procedure: TRANSESOPHAGEAL ECHOCARDIOGRAM (TEE);  Surgeon: Laurey Morale, MD;  Location: [facility] ENDOSCOPY;  Service: Cardiovascular;  Laterality: N/A;   TEE WITHOUT CARDIOVERSION N/A 11/21/2017   Procedure: TRANSESOPHAGEAL ECHOCARDIOGRAM (TEE);  Surgeon: Tonny Bollman, MD;  Location: [facility] OR;  Service: Open Heart Surgery;  Laterality: N/A;   THYROID LOBECTOMY Left 09/17/2009   which showed a 3.5 cm follicular variant papillary cancer   THYROIDECTOMY     "2nd OR on my thyroid they took out the whole thing"   TRANSCATHETER  AORTIC VALVE REPLACEMENT, TRANSFEMORAL  11/21/2017   Transcatheter Aortic Valve Replacement - Percutaneous Right Transfemoral Approach   TRANSCATHETER AORTIC VALVE REPLACEMENT, TRANSFEMORAL N/A 11/21/2017   Procedure: TRANSCATHETER AORTIC VALVE REPLACEMENT, TRANSFEMORAL using a 23mm Edwards Sapien 3 Aortic Valve;  Surgeon: Tonny Bollman, MD;  Location: [facility] OR;  Service: Open Heart Surgery;  Laterality: N/A;   TRANSTHORACIC ECHOCARDIOGRAM  05/2016   EF 25-30%. Moderate aortic stenosis with moderate regurgitation. Also potential aortic stenosis with likely worse than expected regurgitation   TRANSTHORACIC ECHOCARDIOGRAM  May 2012    EF 40-45%, inferior hypokinesis. Slightly increased transaortic velocity = mild AS with mild to moderate AI normal MV gradients with no MR. Significantly improved PA pressures. Paradoxical septal motion.     Social History:   reports that she quit smoking about 14 years ago. Her smoking use included cigarettes. She started smoking about 29 years ago. She has a 1.5 pack-year smoking history. She has never used smokeless tobacco. She reports that she does not drink alcohol and does not use drugs.   Family History:  Her family history includes CVA in her mother; Heart disease in her mother.   Allergies Allergies  Allergen Reactions   Morphine And Codeine Other (See Comments)    Hallucinations      Home Medications  Prior to Admission medications   Medication Sig Start Date End Date Taking? Authorizing Provider  acetaminophen (TYLENOL) 325 MG tablet Take 325 mg by mouth daily.   Yes [provider]  albuterol (PROVENTIL HFA;VENTOLIN HFA) 108 (90 BASE) MCG/ACT inhaler Inhale 2 puffs into the lungs every 6 (six) hours as needed for shortness of breath.    Yes [provider]  apixaban (ELIQUIS) 5 MG TABS tablet Take 1 tablet (5 mg total) by mouth 2 (two) times daily. 12/14/22  Yes Laurey Morale, MD  aspirin 81 MG chewable tablet Chew 1 tablet (81  mg total) by mouth daily. 01/10/17  Yes Graciella Freer, PA-C  beclomethasone (QVAR) 40 MCG/ACT inhaler Inhale 2 puffs into the lungs daily as needed (shortness of breath).   Yes [provider]  carvedilol (COREG) 12.5 MG  tablet Take 1 tablet (12.5 mg total) by mouth 2 (two) times daily with a meal. 05/22/23  Yes Laurey Morale, MD  dapagliflozin propanediol (FARXIGA) 10 MG TABS tablet Take 1 tablet (10 mg total) by mouth daily before breakfast. 05/30/23  Yes Laurey Morale, MD  furosemide (LASIX) 20 MG tablet Take 20 mg by mouth daily. 05/31/23  Yes [provider]  levothyroxine (SYNTHROID) 100 MCG tablet Take 100 mcg by mouth daily before breakfast.   Yes [provider]  sacubitril-valsartan (ENTRESTO) 49-51 MG Take 1 tablet by mouth 2 (two) times daily. 08/10/22  Yes Laurey Morale, MD  Semaglutide, 2 MG/DOSE, (OZEMPIC, 2 MG/DOSE,) 8 MG/3ML SOPN Inject 2 mg into the skin once a week. 05/12/23  Yes Laurey Morale, MD  spironolactone (ALDACTONE) 25 MG tablet TAKE 1 TABLET(25 MG) BY MOUTH DAILY 12/16/22  Yes Laurey Morale, MD  carvedilol (COREG) 3.125 MG tablet Take 1 tablet (3.125 mg total) by mouth 2 (two) times daily with a meal. 06/12/23   Tiffany Kocher, DO  dapagliflozin propanediol (FARXIGA) 10 MG TABS tablet Take 1 tablet (10 mg total) by mouth daily. 06/12/23   Tiffany Kocher, DO  potassium chloride (KLOR-CON) 10 MEQ tablet Take 10 mEq by mouth 2 (two) times daily. Patient not taking: Reported on 06/09/2023    [provider]  rosuvastatin (CRESTOR) 20 MG tablet TAKE 1 TABLET(20 MG) BY MOUTH DAILY Patient not taking: Reported on 06/09/2023 09/15/22   Laurey Morale, MD  rosuvastatin (CRESTOR) 20 MG tablet Take 1 tablet (20 mg total) by mouth daily. 06/12/23   Tiffany Kocher, DO     Critical care time: 49 minutes     Joneen Roach, AGACNP-BC [facility]  See Amion for personal pager PCCM on call pager [phone] until 7pm. Please call Elink 7p-7a. [phone]  06/12/2023 3:17 PM

## 2023-06-12 NOTE — Evaluation (Signed)
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) 06/09/2023   Diabetes mellitus without complication (HCC) 10/19/2021   PVD (peripheral vascular disease) (HCC) 07/31/2018   CKD (chronic kidney disease)    S/P TAVR (transcatheter aortic valve replacement) 11/21/2017   Aortic valve stenosis and insufficiency, rheumatic 07/27/2016   Pulmonary hypertension (HCC)    Valvular cardiomyopathy (HCC)    History of GI bleeding    Grade II hemorrhoids 07/06/2016   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   Papillary thyroid carcinoma (HCC) 05/10/2016   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 03/31/2014   Acute on chronic combined systolic and diastolic CHF (congestive heart failure) (HCC)    Obesity, Class III, BMI 40-49.9 (morbid obesity) (HCC)    Neck pain 03/05/2014   Hyperlipidemia with target LDL less than 100 12/16/2013   Hypothyroidism 12/16/2013   Hx of thyroid cancer 12/16/2013   Chronic chest wall pain 10/11/2012   TIA (transient ischemic attack) 05/31/2012   Cardiomyopathy (HCC) 02/28/2012   CHF (congestive heart failure) (HCC) 02/28/2012   Depression 02/13/2012   HTN (hypertension) 02/06/2012   Presence of prosthetic heart valve 02/06/2012   Post-menopausal bleeding 12/05/2011   Fibroids 12/05/2011   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   S/P aortic valve repair 12/23/2008   S/P MVR (mitral valve repair) 12/23/2008   Other specified postprocedural states 12/23/2008   Past Medical History:  Past Medical History:  Diagnosis Date   Chronic combined systolic and diastolic CHF (congestive heart failure) (HCC)    EF  now down to 25-30%. LVEDP on cath was 30 mmHg with wedge pressure of 31 mmHg.   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  mean PA 74/39 mean 52, mean PCWP 31 (with V wave of 45 mmHg), LVEDP ~ 30 mmHg.   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 Cornelius Moras  Echo 12/2010: EF 40-45%, inferior hypokinesis; Slow progression of valvular Dz & drop in EF --> 05/2016: EF 25-30% (down from 40-45% post-op) with Mod AS/AI, mild MS & Severe MR (Cath & TEE 06/2016)    S/P aortic valve repair 12/23/2008   suture plication of 3 commissures   S/P MVR (mitral valve repair) 12/23/2008   26 mm Sorin MEMO 3D Ring Annuloplasty   S/P TAVR (transcatheter aortic valve replacement) 11/21/2017   23 mm Edwards Sapien 3 transcatheter heart valve placed via percutaneous right transfemoral approach    Valvular cardiomyopathy (HCC)    EF progressively worsened from 40-45% down to 25-30%  by February 2018. Progressive worsening of aortic and mitral valve disease.   Past Surgical History:  Past Surgical History:  Procedure Laterality Date   AORTIC VALVE REPAIR  August 2010   Suture plication of all 3 commissures   CARDIAC CATHETERIZATION  August 2010   Preop: nonobstructive coronary disease   CESAREAN SECTION  1981; 1996   MITRAL VALVE REPAIR  August 2010    26 mm Sorin [facility] 3D Ring  Annuloplasty   RIGHT HEART CATH N/A 08/30/2016   Procedure: Right Heart Cath;  Surgeon: Laurey Morale, MD;  Location: [facility] INVASIVE CV LAB;  Service: Cardiovascular;  Laterality: N/A;   RIGHT/LEFT HEART CATH AND CORONARY ANGIOGRAPHY N/A 07/08/2016   Procedure: Right/Left Heart Cath and Coronary Angiography;  Surgeon: Marykay Lex, MD;  Location: [facility] INVASIVE CV LAB;  Service: Cardiovascular: Normal coronaries, mean RA 18, RV pressure/EDP: 70/12/18 mmHg.  mean PA 74/39 mean 52, mean PCWP 31 (with V wave of 45 mmHg), LVEDP ~ 30 mmHg.  CI 1.98 Fick. Peak AoV gradient ~15 mHg     TEE WITHOUT CARDIOVERSION N/A 07/13/2016   Procedure: Transesophageal Echocardiogram (TEE);  Surgeon: Chrystie Nose, MD;  Location: [facility] ENDOSCOPY;  Service: Cardiovascular:  EF 25-30%, dilated LV - diffuse hypokinesis, rheumatic-appearing Aortic Vallve s/p repair with moderate AS (AVA 1.1 cm^2), moderate central AI.  MV s/p repeat with mild mitral stenosis and eccentric severe MR.     TEE WITHOUT CARDIOVERSION     TEE WITHOUT CARDIOVERSION N/A 06/28/2017   Procedure: TRANSESOPHAGEAL ECHOCARDIOGRAM (TEE);  Surgeon: Laurey Morale, MD;  Location: [facility] ENDOSCOPY;  Service: Cardiovascular;  Laterality: N/A;   TEE WITHOUT CARDIOVERSION N/A 11/21/2017   Procedure: TRANSESOPHAGEAL ECHOCARDIOGRAM (TEE);  Surgeon: Tonny Bollman, MD;  Location: [facility] OR;  Service: Open Heart Surgery;  Laterality: N/A;   THYROID LOBECTOMY Left 09/17/2009   which showed a 3.5 cm follicular variant papillary cancer   THYROIDECTOMY     "2nd OR on my thyroid they took out the whole thing"   TRANSCATHETER AORTIC VALVE REPLACEMENT, TRANSFEMORAL  11/21/2017   Transcatheter Aortic Valve Replacement - Percutaneous Right Transfemoral Approach   TRANSCATHETER AORTIC VALVE REPLACEMENT, TRANSFEMORAL N/A 11/21/2017   Procedure: TRANSCATHETER AORTIC VALVE REPLACEMENT, TRANSFEMORAL using a 23mm Edwards Sapien 3 Aortic Valve;  Surgeon: Tonny Bollman, MD;  Location: [facility] OR;   Service: Open Heart Surgery;  Laterality: N/A;   TRANSTHORACIC ECHOCARDIOGRAM  05/2016   EF 25-30%. Moderate aortic stenosis with moderate regurgitation. Also potential aortic stenosis with likely worse than expected regurgitation   TRANSTHORACIC ECHOCARDIOGRAM  May 2012    EF 40-45%, inferior hypokinesis. Slightly increased transaortic velocity = mild AS with mild to moderate AI normal MV gradients with no MR. Significantly improved PA pressures. Paradoxical septal motion.   HPI:  [name] is a 63 y.o. 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 MRI small acute infarcts affecting the posterior left insula and the left temporal lobe. 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   Assessment / Plan / Recommendation Clinical Impression  Pt seen for speech-language-cognitive assessment and pt lives with daughter. Daughter present, sleeping on arrival, checked text but did not initiate interaction with this therapist. Pt stated she felt that her cognition was at baseline but at  the end of assessment aware that she had difficulty. She scored a 13/30 on the SLUMS placing her at significant cognitive impairment in the areas of memory, problem solving, divergent naming, placing time on clock. She was however able to recall relevant details of her day with time of her TEE and that she may be discharged after that. SLP recommends continued ST while on acute care and at discharge at next venue of care (home health or outpatient). Her daughter does her finances but pt states she is responsible for her medications. Recommend daughter supervise her with medication management.    SLP Assessment   SLP Recommendation/Assessment: Patient needs continued Speech Lanaguage Pathology Services SLP Visit Diagnosis: Cognitive communication deficit (R41.841)    Recommendations for follow up therapy are one component of a multi-disciplinary discharge planning process, led by the attending physician.  Recommendations may be updated based on patient status, additional functional criteria and insurance authorization.    Follow Up Recommendations  Other (comment) (home health or outpatient)    Assistance Recommended at Discharge  Frequent or constant Supervision/Assistance  Functional Status Assessment Patient has had a recent decline in their functional status and demonstrates the ability to make significant improvements in function in a reasonable and predictable amount of time.  Frequency and Duration min 2x/week  2 weeks      SLP Evaluation Cognition  Overall Cognitive Status: Impaired/Different from baseline Arousal/Alertness: Awake/alert Orientation Level: Oriented to person;Oriented to place (oriented to year) Year: 2025 Day of Week: Incorrect Attention: Sustained Sustained Attention: Appears intact Memory: Impaired Memory Impairment: Retrieval deficit (0/5 words) Awareness: Impaired Awareness Impairment: Anticipatory impairment Problem Solving: Impaired Problem Solving Impairment: Functional basic Safety/Judgment: Impaired       Comprehension  Auditory Comprehension Overall Auditory Comprehension: Appears within functional limits for tasks assessed Visual Recognition/Discrimination Discrimination: Not tested Reading Comprehension Reading Status: Not tested    Expression Expression Primary Mode of Expression: Verbal Verbal Expression Overall Verbal Expression: Appears within functional limits for tasks assessed Initiation: No impairment Level of Generative/Spontaneous Verbalization: Conversation Repetition:  (NT) Naming: Impairment Divergent:  (named 11 items in one  minute) Pragmatics: No impairment Written Expression Dominant Hand: Right Written Expression: Not tested   Oral / Motor  Oral Motor/Sensory Function Overall Oral Motor/Sensory Function: Mild impairment Facial ROM: Within Functional Limits Facial Symmetry: Abnormal symmetry right;Suspected CN VII (facial) dysfunction Lingual ROM: Within Functional Limits Lingual Symmetry: Abnormal symmetry right;Suspected CN XII (hypoglossal) dysfunction Motor Speech Overall Motor Speech: Impaired Respiration: Within functional limits Phonation: Normal Resonance: Within functional limits Articulation: Within functional limitis Intelligibility: Intelligible Motor Planning: Witnin functional limits Motor Speech Errors: Not applicable            Royce Macadamia 06/12/2023, 1:05 PM

## 2023-06-12 NOTE — Progress Notes (Addendum)
Daily Progress Note Intern Pager: [phone]  Patient name: [name] Medical record number: [id number] Date of birth: May 15, 1961 Age: 63 y.o. Gender: female  Primary Care Provider: Pcp, No Consultants: Neurology, Cardiology Code Status: Full Code  Pt Overview and Major Events to Date:  1/17: Admitted 1/20: TEE  Assessment and Plan: [name] is a 63 y.o. 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 TEE per cardiology today to further assess this as the likely cause of embolic stroke.  Assessment & Plan Stroke [facility]) Acute infarcts affecting the posterior left insula. Strength improved 5/5 bilateral LE/UE. LDL 40. A1C 5.9. TSH WNL. Echo on 1/14 unchanged from prior. SLP eval states no restrictions. - Neurology consulted, appreciate recommendations - Cardiology consulted, appreciate recommendations - TEE today - ASA 81 mg  - Permissive HTN (although pressures are normotensive to soft) - holding home eliquis/heparin drip due to size of stroke per neurology - Cardiac monitoring - Neuro checks q4h - Fall precautions - Delirium precautions - PT/OT treat Chronic systolic heart failure (HCC) Euvolemic on exam. Caution restarting GDMT in setting of recent CVA and soft blood pressures. Holding Entresto, carvedilol, spironolactone. - Cardiology consulted, appreciate recommendations - Restart GDMT cautiously; restarted Farxiga and Carvedilol  Chronic and Stable Problems:  CKD: Cr stable T2DM: On Ozempic weekly.  Held inpatient. Check glucose on AM labs. S/p TAVR and MVR: On Eliquis 5 mg BID; on hold due to size of stroke per neurology HLD: Crestor 20 mg  daily Hypothyroidism s/p thyroidectomy d/t papillary thyroid cancer: Restarted home synthroid 1/18  FEN/GI: NPO PPx: SCDs Dispo:Home with home health OT/PT tomorrow. Barriers include TEE.   Subjective:  Patient is doing well this morning and feels like she is improving.  No concerns overnight.  Objective: Temp:  [98 F (36.7 C)-98.3 F (36.8 C)] 98 F (36.7 C) (01/20 0333) Pulse Rate:  [70-78] 70 (01/20 0333) Resp:  [16-18] 16 (01/20 0333) BP: (92-114)/(67-79) 102/68 (01/20 0333) SpO2:  [96 %-100 %] 100 % (01/20 0333) Physical Exam: General: Awake and alert in NAD. Cardiovascular: RRR. No M/R/G. Respiratory: CTAB. Normal WOB on RA. No wheezing, crackles, rhonchi, or diminished breath sounds. Abdomen: Soft, non-tender, non-distended. Normoactive bowel sounds. Extremities: Able to move all extremities. SCDs on. Neuro: CN II: PERRL CN III, IV,VI: EOMI CV V: Normal sensation in V1, V2, V3 CVII: Symmetric smile and brow raise; R sided facial asymmetry improved CN VIII: Normal hearing CN IX,X: Symmetric palate raise  CN XI: 5/5 shoulder shrug CN XII: R sided tongue deviation  UE and LE strength 3/5 on R and 4/5 on L Normal sensation in UE and LE bilaterally   Laboratory: Most recent CBC Lab Results  Component Value Date   WBC 5.2 06/09/2023   HGB 13.9 06/09/2023   HCT 41.0 06/09/2023   MCV 90.0 06/09/2023   PLT 83 (L) 06/09/2023   Most recent BMP    Latest Ref Rng & Units 06/12/2023    4:53 AM  BMP  Glucose 70 - 99 mg/dL 83   BUN 8 - 23 mg/dL 11   Creatinine 1.47 - 1.00 mg/dL 8.29   Sodium 562 - 130 mmol/L 138   Potassium 3.5 - 5.1 mmol/L 4.0   Chloride 98 -  111 mmol/L 106   CO2 22 - 32 mmol/L 22   Calcium 8.9 - 10.3 mg/dL 8.8    Mag: 2.6  Imaging/Diagnostic Tests: No new imaging.  Fortunato Curling, DO 06/12/2023, 6:07 AM PGY-1, [facility]  FPTS Intern pager: [phone], text pages welcome Secure chat group [facility]  Teaching Service

## 2023-06-12 NOTE — Interval H&P Note (Signed)
History and Physical Interval Note:  06/12/2023 1:11 PM  [name]  has presented today for surgery, with the diagnosis of cva.  The various methods of treatment have been discussed with the patient and family. After consideration of risks, benefits and other options for treatment, the patient has consented to  Procedure(s): TRANSESOPHAGEAL ECHOCARDIOGRAM (N/A) as a surgical intervention.  The patient's history has been reviewed, patient examined, no change in status, stable for surgery.  I have reviewed the patient's chart and labs.  Questions were answered to the patient's satisfaction.     Little Ishikawa

## 2023-06-12 NOTE — TOC Initial Note (Signed)
Transition of Care [facility]) - Initial/Assessment Note    Patient Details  Name: [name] MRN: [id number] Date of Birth: 1960/09/20  Transition of Care [facility]) CM/SW Contact:    Kermit Balo, RN Phone Number: 06/12/2023, 3:29 PM  Clinical Narrative:                  Pt is from home with her daughter. Daughter is able to provide needed supervision at home.  Pt uses her medicaid transportation for appointments.  She denies issues with home medications.  Unable to get home health to accept for services. Pt is agreeable to outpatient therapy. This will need to be arranged closer to d/c home incase recommendations change.  CM has asked MD group to pick up for PCP. TOC following.    Expected Discharge Plan: OP Rehab Barriers to Discharge: Continued Medical Work up   Patient Goals and CMS Choice     Choice offered to / list presented to : Patient      Expected Discharge Plan and Services   Discharge Planning Services: CM Consult   Living arrangements for the past 2 months: Apartment                                      Prior Living Arrangements/Services Living arrangements for the past 2 months: Apartment Lives with:: Adult Children Patient language and need for interpreter reviewed:: Yes Do you feel safe going back to the place where you live?: Yes        Care giver support system in place?: Yes (comment)   Criminal Activity/Legal Involvement Pertinent to Current Situation/Hospitalization: No - Comment as needed  Activities of Daily Living   ADL Screening (condition at time of admission) Independently performs ADLs?: No Does the patient have a NEW difficulty with bathing/dressing/toileting/self-feeding that is expected to last >3 days?: Yes (Initiates electronic notice to provider for possible OT consult) (stroke workup) Does the patient have a NEW difficulty with getting in/out of bed, walking, or climbing stairs that is expected to last >3 days?: Yes  (Initiates electronic notice to provider for possible PT consult) (stroke workup) Does the patient have a NEW difficulty with communication that is expected to last >3 days?: No Is the patient deaf or have difficulty hearing?: No Does the patient have difficulty seeing, even when wearing glasses/contacts?: No Does the patient have difficulty concentrating, remembering, or making decisions?: No  Permission Sought/Granted                  Emotional Assessment Appearance:: Appears stated age Attitude/Demeanor/Rapport: Engaged Affect (typically observed): Accepting Orientation: : Oriented to Self, Oriented to Place, Oriented to  Time, Oriented to Situation   Psych Involvement: No (comment)  Admission diagnosis:  Stroke [facility]) [I63.9] 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) [I63.10] Patient Active Problem List   Diagnosis Date Noted   Chronic systolic heart failure (HCC) 06/10/2023   Stroke (HCC) 06/09/2023   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) 06/09/2023   Diabetes mellitus without complication (HCC) 10/19/2021   PVD (peripheral vascular disease) (HCC) 07/31/2018   CKD (chronic kidney disease)    S/P TAVR (transcatheter aortic valve replacement) 11/21/2017   Aortic valve stenosis and insufficiency, rheumatic 07/27/2016   Pulmonary hypertension (HCC)    Valvular cardiomyopathy (HCC)    History of GI bleeding    Grade II hemorrhoids 07/06/2016  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   Papillary thyroid carcinoma (HCC) 05/10/2016   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 03/31/2014   Acute on chronic combined systolic and diastolic CHF (congestive heart failure) (HCC)    Obesity, Class III, BMI 40-49.9 (morbid obesity) (HCC)    Neck pain 03/05/2014   Hyperlipidemia with target LDL less than 100 12/16/2013   Hypothyroidism 12/16/2013   Hx of thyroid cancer 12/16/2013   Chronic chest wall pain 10/11/2012   TIA (transient  ischemic attack) 05/31/2012   Cardiomyopathy (HCC) 02/28/2012   CHF (congestive heart failure) (HCC) 02/28/2012   Depression 02/13/2012   HTN (hypertension) 02/06/2012   Presence of prosthetic heart valve 02/06/2012   Post-menopausal bleeding 12/05/2011   Fibroids 12/05/2011   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   S/P aortic valve repair 12/23/2008   S/P MVR (mitral valve repair) 12/23/2008   Other specified postprocedural states 12/23/2008   PCP:  Oneita Hurt, No Pharmacy:   [id number] [place], [place] - [address] AT [address] [place] [address] Phone: [phone] Fax: [phone]  [facility] [address] [place] [address] Phone: [phone] Fax: [phone]     Social Drivers of Health (SDOH) Social History: SDOH Screenings   Food Insecurity: No Food Insecurity (06/09/2023)  Housing: Low Risk  (06/09/2023)  Transportation Needs: No Transportation Needs (06/09/2023)  Utilities: Not At Risk (06/09/2023)  Social Connections: Unknown (10/03/2021)   Received from [facility]  Tobacco Use: Medium Risk (06/09/2023)   SDOH Interventions:     Readmission Risk Interventions     No data to display

## 2023-06-12 NOTE — Anesthesia Procedure Notes (Signed)
Procedure Name: Intubation Date/Time: 06/12/2023 1:26 PM  Performed by: Debbe Odea, CRNAPre-anesthesia Checklist: Patient identified, Emergency Drugs available, Suction available and Patient being monitored Patient Re-evaluated:Patient Re-evaluated prior to induction Oxygen Delivery Method: Circle System Utilized Preoxygenation: Pre-oxygenation with 100% oxygen Induction Type: IV induction Ventilation: Mask ventilation without difficulty and Two handed mask ventilation required Laryngoscope Size: Glidescope and 3 Grade View: Grade I Tube type: Oral Tube size: 7.0 mm Number of attempts: 1 Airway Equipment and Method: Stylet and Oral airway Placement Confirmation: ETT inserted through vocal cords under direct vision, positive ETCO2 and breath sounds checked- equal and bilateral Secured at: 21 cm Tube secured with: Tape Dental Injury: Teeth and Oropharynx as per pre-operative assessment

## 2023-06-13 ENCOUNTER — Other Ambulatory Visit (HOSPITAL_COMMUNITY): Payer: Self-pay

## 2023-06-13 ENCOUNTER — Inpatient Hospital Stay (HOSPITAL_COMMUNITY): Payer: Medicaid Other

## 2023-06-13 ENCOUNTER — Encounter (HOSPITAL_COMMUNITY): Payer: Self-pay | Admitting: Cardiology

## 2023-06-13 DIAGNOSIS — J9602 Acute respiratory failure with hypercapnia: Secondary | ICD-10-CM

## 2023-06-13 DIAGNOSIS — R609 Edema, unspecified: Secondary | ICD-10-CM

## 2023-06-13 DIAGNOSIS — Z9889 Other specified postprocedural states: Secondary | ICD-10-CM | POA: Diagnosis not present

## 2023-06-13 DIAGNOSIS — I5022 Chronic systolic (congestive) heart failure: Secondary | ICD-10-CM | POA: Diagnosis not present

## 2023-06-13 DIAGNOSIS — I639 Cerebral infarction, unspecified: Secondary | ICD-10-CM | POA: Diagnosis not present

## 2023-06-13 DIAGNOSIS — Z952 Presence of prosthetic heart valve: Secondary | ICD-10-CM | POA: Diagnosis not present

## 2023-06-13 DIAGNOSIS — I63412 Cerebral infarction due to embolism of left middle cerebral artery: Secondary | ICD-10-CM | POA: Diagnosis not present

## 2023-06-13 DIAGNOSIS — J9601 Acute respiratory failure with hypoxia: Secondary | ICD-10-CM | POA: Diagnosis not present

## 2023-06-13 LAB — BASIC METABOLIC PANEL
Anion gap: 12 (ref 5–15)
BUN: 16 mg/dL (ref 8–23)
CO2: 16 mmol/L — ABNORMAL LOW (ref 22–32)
Calcium: 8.2 mg/dL — ABNORMAL LOW (ref 8.9–10.3)
Chloride: 112 mmol/L — ABNORMAL HIGH (ref 98–111)
Creatinine, Ser: 1.52 mg/dL — ABNORMAL HIGH (ref 0.44–1.00)
GFR, Estimated: 39 mL/min — ABNORMAL LOW (ref >60)
Glucose, Bld: 124 mg/dL — ABNORMAL HIGH (ref 70–99)
Potassium: 4.6 mmol/L (ref 3.5–5.1)
Sodium: 140 mmol/L (ref 135–145)

## 2023-06-13 LAB — CBC
HCT: 45.8 % (ref 36.0–46.0)
Hemoglobin: 14.6 g/dL (ref 12.0–15.0)
MCH: 28.4 pg (ref 26.0–34.0)
MCHC: 31.9 g/dL (ref 30.0–36.0)
MCV: 89.1 fL (ref 80.0–100.0)
Platelets: 115 10*3/uL — ABNORMAL LOW (ref 150–400)
RBC: 5.14 MIL/uL — ABNORMAL HIGH (ref 3.87–5.11)
RDW: 15.2 % (ref 11.5–15.5)
WBC: 6.3 10*3/uL (ref 4.0–10.5)
nRBC: 0 % (ref 0.0–0.2)

## 2023-06-13 LAB — MAGNESIUM: Magnesium: 2.6 mg/dL — ABNORMAL HIGH (ref 1.7–2.4)

## 2023-06-13 MED ORDER — LACTATED RINGERS IV BOLUS
500.0000 mL | Freq: Once | INTRAVENOUS | Status: AC
  Administered 2023-06-13: 500 mL via INTRAVENOUS

## 2023-06-13 MED ORDER — ORAL CARE MOUTH RINSE: 15.0000 mL | OROMUCOSAL | Status: DC | PRN

## 2023-06-13 MED ORDER — DOCUSATE SODIUM 100 MG PO CAPS
100.0000 mg | ORAL_CAPSULE | Freq: Two times a day (BID) | ORAL | Status: DC
  Administered 2023-06-13 – 2023-06-15 (×5): 100 mg via ORAL
  Filled 2023-06-13 (×7): qty 1

## 2023-06-13 MED ORDER — FAMOTIDINE 20 MG PO TABS
20.0000 mg | ORAL_TABLET | Freq: Two times a day (BID) | ORAL | Status: DC
  Administered 2023-06-13 – 2023-06-17 (×8): 20 mg via ORAL
  Filled 2023-06-13 (×8): qty 1

## 2023-06-13 MED ORDER — DABIGATRAN ETEXILATE MESYLATE 150 MG PO CAPS
150.0000 mg | ORAL_CAPSULE | Freq: Two times a day (BID) | ORAL | Status: DC
  Administered 2023-06-13 – 2023-06-17 (×8): 150 mg via ORAL
  Filled 2023-06-13 (×12): qty 1

## 2023-06-13 MED ORDER — POLYETHYLENE GLYCOL 3350 17 G PO PACK
17.0000 g | PACK | Freq: Every day | ORAL | Status: DC
  Administered 2023-06-14: 17 g via ORAL
  Filled 2023-06-13 (×2): qty 1

## 2023-06-13 NOTE — Progress Notes (Incomplete)
1520: Pt not able to urinate in bed. Pt assisted to [facility] ax1. Pt eager to sit in recliner. During completing pts bath, pt noted to have R arm flaccidity and R sided facial droop. Stroke called. Per Dr. Roda Shutters, return pt to bed, stay flat. Pt returned to bed ax2 for safety, lying flat. Dr. Roda Shutters at bedside. Pt placed in trendelenburg, fluid bolus started. Improvement noted in pts symptoms back to baseline. 500 bolus of NS given, CT order placed for this evening to be done to r/o prior prior to restarting anticoag.  1700: Pts BP noted to be in 80s, recheck on other arm in 60s. Pt at baseline mentation. APPs made aware. APP at bedside. Bolus of LR started.

## 2023-06-13 NOTE — Progress Notes (Signed)
PHARMACY - ANTICOAGULATION CONSULT NOTE  Pharmacy Consult for Pradaxa  Indication:  potential valvular thrombosis  Allergies  Allergen Reactions   Morphine And Codeine Other (See Comments)    Hallucinations     Patient Measurements: Height: 4\' 9"  (144.8 cm) Weight: 81 kg (178 lb 9.2 oz) IBW/kg (Calculated) : 38.6  Vital Signs: Temp: 97.6 F (36.4 C) (01/21 1200) Temp Source: Oral (01/21 1200) BP: 94/77 (01/21 1300) Pulse Rate: 75 (01/21 1300)  Labs: Recent Labs    06/12/23 0453 06/12/23 1358 06/12/23 1525 06/12/23 1840 06/13/23 0520  HGB  --    < > 14.3 14.5 14.6  HCT  --    < > 42.0 44.9 45.8  PLT  --   --   --  141* 115*  CREATININE 1.38*  --   --  1.35* 1.52*   < > = values in this interval not displayed.    Estimated Creatinine Clearance: 34.1 mL/min (A) (by C-G formula based on SCr of 1.52 mg/dL (H)).   Medical History: Past Medical History:  Diagnosis Date   Chronic combined systolic and diastolic CHF (congestive heart failure) (HCC)    EF now down to 25-30%. LVEDP on cath was 30 mmHg with wedge pressure of 31 mmHg.   CKD (chronic kidney disease)    Dementia (HCC)    Early onset   Essential hypertension 12/16/2013   History of GI bleeding    Hyperlipidemia with target LDL less than 100 12/16/2013   Hypothyroidism 12/16/2013   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 03/31/2014   Morbid obesity (HCC)    Papillary carcinoma, follicular variant (HCC) 09/17/2009   [name] [facility]/10/[facility]   Papillary thyroid carcinoma (HCC)    [name] 05/10/2016 from [facility]   Post-menopausal bleeding 12/05/2011   Pulmonary hypertension (HCC) 05/2016   By cardiac catheterization: mean RA 18, RV pressure/EDP: 70/12/18 mmHg.  mean PA 74/39 mean 52, mean PCWP 31 (with V wave of 45 mmHg), LVEDP ~ 30 mmHg.   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 Cornelius Moras  Echo 12/2010: EF  40-45%, inferior hypokinesis; Slow progression of valvular Dz & drop in EF --> 05/2016: EF 25-30% (down from 40-45% post-op) with Mod AS/AI, mild MS & Severe MR (Cath & TEE 06/2016)    S/P aortic valve repair 12/23/2008   suture plication of 3 commissures   S/P MVR (mitral valve repair) 12/23/2008   26 mm Sorin MEMO 3D Ring Annuloplasty   S/P TAVR (transcatheter aortic valve replacement) 11/21/2017   23 mm Edwards Sapien 3 transcatheter heart valve placed via percutaneous right transfemoral approach    Valvular cardiomyopathy (HCC)    EF progressively worsened from 40-45% down to 25-30% by February 2018. Progressive worsening of aortic and mitral valve disease.   Assessment: Patient with PMH of mitral valve repair s/p TAVR. Has had a history of leaflet thrombosis of TAVR valve on Eliquis PTA. Admitted with acute M2 stroke, TEE did not show thrombosis on mitral valve however elected to change per cardiology as a cardio embolic source cannot be ruled out.   HgB 14.6 and PLTs 115. No s/sx of bleeding noted.   Goal of Therapy:  Monitor platelets by anticoagulation protocol: Yes   Plan:  Pradaxa 150mg  BID, CrCl > 34mL/min.  Monitor for s/sx of bleeding. Will require prior authorization for insurance coverage, started PA with  patient access advocate.   [name], PharmD, BCCCP  06/13/2023,1:23 PM

## 2023-06-13 NOTE — Plan of Care (Signed)
Spoke with CCM, patient was extubated today and is doing well. She is following commands and has normal mentation, confirmed by daughter at bedside. Will be transferred out of the ICU today. FMTS to resume care on 1/22 at 7 AM.  Fortunato Curling, DO [facility], PGY-1 06/13/23 3:02 PM  Service pager [phone]

## 2023-06-13 NOTE — Progress Notes (Signed)
eLink Physician-Brief Progress Note Patient Name: [name] DOB: [birth year] MRN: [id number]   Date of Service  06/13/2023  HPI/Events of Note  Bladder scan with 400 cc urine   eICU Interventions  I/O cath x 1      Intervention Category Intermediate Interventions: Oliguria - evaluation and management  Oretha Milch 06/13/2023, 5:16 AM

## 2023-06-13 NOTE — Progress Notes (Signed)
I personally saw the patient and performed a substantive portion of this encounter, including a complete performance of at least one of the key components (MDM, Hx and/or Exam), in conjunction with the Advanced Practice Provider.   Admitted 1/17 with left MCA CVA.  She underwent TEE on 1/20 which was complicated by bronchospasm and possible aspiration and required mechanical ventilation Had desaturation requiring PEEP of 10  Urinary retention overnight , patient has been held.  Afebrile, on Precedex, wakes up and follows commands, RASS -1, off Neo-Synephrine. 500 cc urine output charted On exam -bilateral ventilated breath sounds, S1-S2 regular, soft nontender abdomen, moves all 4 extremities  ABG shows mild respiratory acidosis Labs show normal electrolytes, BUN/creatinine 16/1.5, no leukocytosis Chest x-ray shows bilateral infiltrates with minimal airspace disease.   Impression/plan  Acute respiratory failure with hypoxia and hypercarbia -?Laryngospasm versus aspiration event during TEE -Status post is being transferred to extubation -Drop PEEP to 5  Hypotension postprocedure -resolved  Acute left MCA CVA -per stroke service Statin, aspirin  S/p TAVR with A-fib thrombosis -resume Eliquis today HFrEF -GDMT per cardiology  My independent critical care time was 32 minutes  Deundra Bard V. Vassie Loll MD

## 2023-06-13 NOTE — Progress Notes (Signed)
NAMEAdilyne Hale, MRN:  [id number], DOB:  February 05, 1961, LOS: 4 ADMISSION DATE:  06/09/2023, CONSULTATION DATE:  06/12/23 REFERRING MD:  Flora Lipps, CHIEF COMPLAINT:  resp failure    History of Present Illness:  63 yo F PMH rheumatic heart disease s/p TAVR on chronic eliquis MV repair AI, AS, HFrEF who was admitted to [facility] 1/17 with R sided weakness -- L M2 territory cva. Neuro was consulted 1/17. Cards consulted 1/18 for help in GDMT, and recommended TEE. Underwent TEE 1/20, complicated by bronchospasm and possible aspiration event, subsequently intubated.   PCCM is consulted in this setting   Pertinent  Medical History  Rheumatic heart disease S/p MV repair S/p TAVR Chronic anticoagulation  AI AS HFrEF CVA CKDIIIa   Significant Hospital Events: Including procedures, antibiotic start and stop dates in addition to other pertinent events   1/17 admitted with R sided weakness. Stroke. 1/20 Decompensated during TEE. Intubated. 1/21 Intubated, goal to wean to extubate   Interim History / Subjective:  Intubated, sedated, following commands  Objective   Blood pressure 109/83, pulse 80, temperature 99.3 F (37.4 C), temperature source Oral, resp. rate (!) 31, height 4\' 9"  (1.448 m), weight 81 kg, SpO2 100%.    Vent Mode: PRVC FiO2 (%):  [35 %-100 %] 35 % Set Rate:  [24 bmp-31 bmp] 31 bmp Vt Set:  [300 mL-400 mL] 350 mL PEEP:  [5 cmH20-12 cmH20] 12 cmH20 Plateau Pressure:  [18 cmH20-25 cmH20] 25 cmH20   Intake/Output Summary (Last 24 hours) at 06/13/2023 0709 Last data filed at 06/13/2023 0700 Gross per 24 hour  Intake 881.91 ml  Output 525 ml  Net 356.91 ml   Filed Weights   06/09/23 1000 06/09/23 1143  Weight: 81 kg 81 kg    Examination: General: acutely ill adult female, lying on ICU bed on vent HEENT: Normocephalic, PERRLA intact, pink MM, poor dentition, ETT  Pulm: clear, bl diminished breath sounds throughout, no distress Cards: s1,s2, RRR, no MRG, no JVD  Abs:  obese, soft, active Skin: no acute abnormalities Extremities: moves all extremities to command, no edema  Neuro: Follows commands, RASS -1 to 1 GU: intact    Resolved Hospital Problem list     Assessment & Plan:   Acute respiratory failure with hypoxia with unknown etiology  Flu A + on 06/09/23  Perhaps TEE probe occluded airway or laryngospasm occurred initially. Then there was concern for aspiration as well.  Did not receive tamiflu  WBC WNL, afebrile  P: Continue Full vent support LTVV, O2 Sats >92%, HOB > 30, Plt pressure < 30  Begin weaning off precedex, conduct WUA and SBT Goal will wean to extubate from vent  Continue droplet prec  Hypotension: post-procedural SBP range from 110s to 120s  P: Will restart coreg, if patient does not extubate-will need to place OG for enteral admin  DC neosynephrine   L M2 branch CVA, chronic small vessel disease  -agree w cardiology - concerning given her chronic AC for her valve TEE 1/20- limited study to due decompensation during TEE, no clear source of emboli fine  P Continue statin, eliquis and ASA Stroke service following appreciate recs and assistance   S/p TAVR > leaflet thrombosis-on Eliquis.  Now with stroke? Failed Eliquis? Chronic anticoagulation  S/p MV repair HFrEF (LVEF 35-40%) 1/20 TEE 35-40% EF, reduced LV function, RV function normal  Repaired Mitral and Aortic valve, no evidence of embolus on limited study, no bubble study conducted due to decompensation  P:  Continue farxiga Continue to optimize GDMT as tolerated  Continue eliquis- may need to try pradaxa or coumadin instead if concern of failing eliquis defer to Cards  Cards following  CKD stage III 3b  Cr baseline between ~ 1.3 to 1.6  P: Continue to monitor intake and output  Continue adequate renal perfusion, avoid nephrotoxic medications Trend renal function daily Optimize electrolytes    Best Practice (right click and "Reselect all SmartList  Selections" daily)   Diet/type: NPO DVT prophylaxis DOAC Pressure ulcer(s): N/A GI prophylaxis: H2B Lines: N/A Foley:  N/A Code Status:  full code Last date of multidisciplinary goals of care discussion   Critical care time: 40 mins     Christian Theseus Birnie AGACNP-BC   [facility] 06/13/2023, 9:32 AM  Please see [email or web address] for pager details.  From 7A-7P if no response, please call [phone]. After hours, please call ELink [phone].

## 2023-06-13 NOTE — Progress Notes (Addendum)
Paged by RN at 3:25pm that pt was helped getting up for bedside commode and then pt had acute onset right facial droop and right arm flaccid. BP was low at that time, with SBP 90s. Put pt back to bed with trendelenburg position, and NS bolus 500cc. BP improved to 100 to 110s. And exam much improved, right facial droop nearly resolved and RUE lift against gravity without drift. Moving BLEs symmetrical. AAO x 3, no aphasia or gaze palsy. Pt symptoms seem to be consistent with orthostatic hypotension which decreased brain perfusion. With IVF and bedrest and she has improved.   Cardiology on board, plan for switching from eliquis to pradaxa. Will repeat CT head before pradaxa.   Marvel Plan, MD PhD Stroke Neurology 06/13/2023 7:06 PM

## 2023-06-13 NOTE — Procedures (Signed)
Extubation Procedure Note  Patient Details:   Name: [name] DOB: 10/10/1960 MRN: [id number]   Airway Documentation:    Vent end date: 06/13/23 Vent end time: 1116   Evaluation  O2 sats: stable throughout Complications: No apparent complications Patient did tolerate procedure well. Bilateral Breath Sounds: Clear, Diminished   Yes,pt could speak post extubation.  Pt extubated to 2 l/m [place] without difficulty. Postitive cuff leak noted prior to extubation.   Audrie Lia 06/13/2023, 11:17 AM

## 2023-06-13 NOTE — Progress Notes (Signed)
STROKE TEAM PROGRESS NOTE   SUBJECTIVE (INTERVAL HISTORY) Pt had hypoxic event during TEE and then vomited with concern of airway protection. She was intubated and TEE was limited. This morning, she is still intubated but she is awake alert and following all simple commands and communicate with nod or shake head. Plan to extubate soon.    OBJECTIVE Temp:  [97.5 F (36.4 C)-99.3 F (37.4 C)] 98.6 F (37 C) (01/21 1600) Pulse Rate:  [71-88] 73 (01/21 1900) Cardiac Rhythm: Normal sinus rhythm (01/21 0800) Resp:  [12-31] 19 (01/21 1900) BP: (63-122)/(28-109) 90/66 (01/21 1900) SpO2:  [97 %-100 %] 100 % (01/21 1900) FiO2 (%):  [28 %-60 %] 28 % (01/21 1116)  Recent Labs  Lab 06/09/23 1054 06/12/23 0840  GLUCAP 96 90   Recent Labs  Lab 06/10/23 0603 06/11/23 0555 06/12/23 0453 06/12/23 1358 06/12/23 1525 06/12/23 1840 06/13/23 0520  NA 140 138 138 139 140 138 140  K 3.1* 4.6 4.0 7.2* 4.1 5.1 4.6  CL 110 110 106  --   --  108 112*  CO2 18* 20* 22  --   --  20* 16*  GLUCOSE 81 84 83  --   --  145* 124*  BUN 11 10 11   --   --  11 16  CREATININE 1.54* 1.31* 1.38*  --   --  1.35* 1.52*  CALCIUM 8.6* 8.7* 8.8*  --   --  8.4* 8.2*  MG  --  2.8* 2.6*  --   --  2.6* 2.6*  PHOS  --   --   --   --   --  3.2  --    Recent Labs  Lab 06/09/23 1053 06/12/23 1840  AST 21 26  ALT 15 16  ALKPHOS 70 76  BILITOT 0.7 1.1  PROT 6.3* 7.3  ALBUMIN 3.2* 3.8   Recent Labs  Lab 06/09/23 1053 06/09/23 1154 06/12/23 1358 06/12/23 1525 06/12/23 1840 06/13/23 0520  WBC 5.2  --   --   --  10.3 6.3  NEUTROABS 3.2  --   --   --   --   --   HGB 13.6 13.9 15.0 14.3 14.5 14.6  HCT 42.5 41.0 44.0 42.0 44.9 45.8  MCV 90.0  --   --   --  88.2 89.1  PLT 83*  --   --   --  141* 115*   No results for input(s): "CKTOTAL", "CKMB", "CKMBINDEX", "TROPONINI" in the last 168 hours. No results for input(s): "LABPROT", "INR" in the last 72 hours.  No results for input(s): "COLORURINE", "LABSPEC",  "PHURINE", "GLUCOSEU", "HGBUR", "BILIRUBINUR", "KETONESUR", "PROTEINUR", "UROBILINOGEN", "NITRITE", "LEUKOCYTESUR" in the last 72 hours.  Invalid input(s): "APPERANCEUR"     Component Value Date/Time   CHOL 85 06/10/2023 0603   TRIG 96 06/10/2023 0603   HDL 26 (L) 06/10/2023 0603   CHOLHDL 3.3 06/10/2023 0603   VLDL 19 06/10/2023 0603   LDLCALC 40 06/10/2023 0603   Lab Results  Component Value Date   HGBA1C 5.9 (H) 06/10/2023      Component Value Date/Time   LABOPIA NONE DETECTED 06/10/2023 1210   COCAINSCRNUR NONE DETECTED 06/10/2023 1210   LABBENZ NONE DETECTED 06/10/2023 1210   AMPHETMU NONE DETECTED 06/10/2023 1210   THCU NONE DETECTED 06/10/2023 1210   LABBARB NONE DETECTED 06/10/2023 1210    Recent Labs  Lab 06/09/23 1130  ETH <10    I have personally reviewed the radiological images below and agree with the  radiology interpretations.  VAS [place] LOWER EXTREMITY VENOUS (DVT) Result Date: 06/13/2023  Lower Venous DVT Study Patient Name:  AIRABELLA SCHAADT  Date of Exam:   06/13/2023 Medical Rec #: [id number]         Accession #:    [id number] Date of Birth: 04-04-1961         Patient Gender: F Patient Age:   63 years Exam Location:  [facility] Procedure:      VAS [place] LOWER EXTREMITY VENOUS (DVT) Referring Phys: Levon Hedger --------------------------------------------------------------------------------  Indications: Edema, and stroke.  Anticoagulation: Eliquis. Comparison Study: No priors. Performing Technologist: Marilynne Halsted RDMS, RVT  Examination Guidelines: A complete evaluation includes B-mode imaging, spectral Doppler, color Doppler, and power Doppler as needed of all accessible portions of each vessel. Bilateral testing is considered an integral part of a complete examination. Limited examinations for reoccurring indications may be performed as noted. The reflux portion of the exam is performed with the patient in reverse Trendelenburg.   +---------+---------------+---------+-----------+----------+--------------+ RIGHT    CompressibilityPhasicitySpontaneityPropertiesThrombus Aging +---------+---------------+---------+-----------+----------+--------------+ CFV      Full           Yes      Yes                                 +---------+---------------+---------+-----------+----------+--------------+ SFJ      Full                                                        +---------+---------------+---------+-----------+----------+--------------+ FV Prox  Full                                                        +---------+---------------+---------+-----------+----------+--------------+ FV Mid   Full                                                        +---------+---------------+---------+-----------+----------+--------------+ FV DistalFull                                                        +---------+---------------+---------+-----------+----------+--------------+ PFV      Full                                                        +---------+---------------+---------+-----------+----------+--------------+ POP      Full           Yes      Yes                                 +---------+---------------+---------+-----------+----------+--------------+ PTV  Full                                                        +---------+---------------+---------+-----------+----------+--------------+ PERO     Full                                                        +---------+---------------+---------+-----------+----------+--------------+   +---------+---------------+---------+-----------+----------+--------------+ LEFT     CompressibilityPhasicitySpontaneityPropertiesThrombus Aging +---------+---------------+---------+-----------+----------+--------------+ CFV      Full           Yes      Yes                                  +---------+---------------+---------+-----------+----------+--------------+ SFJ      Full                                                        +---------+---------------+---------+-----------+----------+--------------+ FV Prox  Full                                                        +---------+---------------+---------+-----------+----------+--------------+ FV Mid   Full                                                        +---------+---------------+---------+-----------+----------+--------------+ FV DistalFull                                                        +---------+---------------+---------+-----------+----------+--------------+ PFV      Full                                                        +---------+---------------+---------+-----------+----------+--------------+ POP      Full           Yes      Yes                                 +---------+---------------+---------+-----------+----------+--------------+ PTV      Full                                                        +---------+---------------+---------+-----------+----------+--------------+  PERO     Full                                                        +---------+---------------+---------+-----------+----------+--------------+    Summary: BILATERAL: - No evidence of deep vein thrombosis seen in the lower extremities, bilaterally. -No evidence of popliteal cyst, bilaterally.   *See table(s) above for measurements and observations.    Preliminary    EP STUDY Result Date: 06/12/2023 See surgical note for result.  DG Chest 1 View Result Date: 06/12/2023 CLINICAL DATA:  Shortness of breath EXAM: CHEST  1 VIEW COMPARISON:  11/21/2017 FINDINGS: Low position of endotracheal tube, tip approximately 1 cm above carina directed towards right mainstem bronchus. Low lung volumes with some linear opacities in the right mid and lower lung, and left perihilar and suprahilar airspace  opacities. Heart size and mediastinal contours are within normal limits. Post TAVR. Sternotomy wires. Surgical clips at the thoracic inlet. Blunting of left lateral costophrenic angle. IMPRESSION: 1. Low position of endotracheal tube, tip 1 cm above carina directed towards right mainstem bronchus. Recommend retraction by 2 cm. 2. Low lung volumes with bilateral atelectasis and/or infiltrates. Electronically Signed   By: Corlis Leak M.D.   On: 06/12/2023 15:45   ECHO TEE Result Date: 06/12/2023    TRANSESOPHOGEAL ECHO REPORT   Patient Name:   NEVEAHA ABRAMYAN Date of Exam: 06/12/2023 Medical Rec #:  [id number]        Height:       57.0 in Accession #:    [id number]       Weight:       178.6 lb Date of Birth:  04-Aug-1960        BSA:          1.714 m Patient Age:    62 years         BP:           104/84 mmHg Patient Gender: F                HR:           84 bpm. Exam Location:  Inpatient Procedure: Transesophageal Echo, Cardiac Doppler, Color Doppler and 3D Echo Indications:     stroke  History:         Patient has prior history of Echocardiogram examinations, most                  recent 06/06/2023. CHF, Pulmonary HTN and chronic kidney                  disease. Rheumatic heart disease.; Risk Factors:Diabetes,                  Former Smoker and Hypertension.                  Aortic Valve: 23 mm Sapien prosthetic, stented (TAVR) valve is                  present in the aortic position. Procedure Date: 11/21/17.                  Mitral Valve: 26 mm Sorin Memo prosthetic annuloplasty ring                  valve is present in the  mitral position. Procedure Date:                  12/23/08.  Sonographer:     Delcie Roch RDCS Referring Phys:  [id number] RHONDA G BARRETT Diagnosing Phys: Epifanio Lesches MD PROCEDURE: After discussion of the risks and benefits of a TEE, an informed consent was obtained from the patient. The patient was intubated. The transesophogeal probe was passed without difficulty through the esophogus of  the patient. Imaged were obtained with the patient in a supine position. Sedation performed by different physician. The patient was monitored while under deep sedation. The patient developed Respiratory depression during the procedure.  IMPRESSIONS  1. Left ventricular ejection fraction, by estimation, is 35 to 40%. The left ventricle has moderately decreased function.  2. Right ventricular systolic function is mildly reduced. The right ventricular size is normal.  3. No left atrial/left atrial appendage thrombus was detected.  4. The mitral valve has been repaired/replaced. Trivial mitral valve regurgitation. The mean mitral valve gradient is 6.0 mmHg with average heart rate of 80 bpm. There is a 26 mm Sorin Memo prosthetic annuloplasty ring present in the mitral position. Procedure Date: 12/23/08.  5. The aortic valve has been repaired/replaced. Aortic valve regurgitation is trivial. There is a 23 mm Sapien prosthetic (TAVR) valve present in the aortic position. Procedure Date: 11/21/17. Aortic valve mean gradient measures 9.0 mmHg. Conclusion(s)/Recommendation(s): Abbreviated study as patient developed significant hypoxia and procedure was aborted. Bubble study was not done. No clear source of embolism seen. 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 FINDINGS  Left Ventricle: Left ventricular ejection fraction, by estimation, is 35 to 40%. The left ventricle has moderately decreased function. The left ventricular internal cavity size was normal in size. Right Ventricle: The right ventricular size is normal. Right vetricular wall thickness was not well visualized. Right ventricular systolic function is mildly reduced. Left Atrium: Left atrial size was normal in size. No left atrial/left atrial appendage thrombus was detected. Right Atrium: Right atrial size was  normal in size. Pericardium: There is no evidence of pericardial effusion. Mitral Valve: The mitral valve has been repaired/replaced. Trivial mitral valve regurgitation. There is a 26 mm Sorin Memo prosthetic annuloplasty ring present in the mitral position. Procedure Date: 12/23/08. MV peak gradient, 10.2 mmHg. The mean mitral valve gradient is 6.0 mmHg with average heart rate of 80 bpm. Tricuspid Valve: The tricuspid valve is normal in structure. Tricuspid valve regurgitation is trivial. Aortic Valve: The aortic valve has been repaired/replaced. Aortic valve regurgitation is trivial. Aortic valve mean gradient measures 9.0 mmHg. Aortic valve peak gradient measures 18.1 mmHg. Aortic valve area, by VTI measures 1.02 cm. There is a 23 mm Sapien prosthetic, stented (TAVR) valve present in the aortic position. Procedure Date: 11/21/17. Pulmonic Valve: The pulmonic valve was not well visualized. Pulmonic valve regurgitation is not visualized. Aorta: The aortic root and ascending aorta are structurally normal, with no evidence of dilitation. IAS/Shunts: No atrial level shunt detected by color flow Doppler.  LEFT VENTRICLE PLAX 2D LVOT diam:     1.80 cm LV SV:         37 LV SV Index:   21 LVOT Area:     2.54 cm  AORTIC VALVE AV Area (Vmax):    0.81 cm AV Area (Vmean):   0.82 cm AV Area (VTI):     1.02  cm AV Vmax:           213.00 cm/s AV Vmean:          135.000 cm/s AV VTI:            0.360 m AV Peak Grad:      18.1 mmHg AV Mean Grad:      9.0 mmHg LVOT Vmax:         68.00 cm/s LVOT Vmean:        43.500 cm/s LVOT VTI:          0.144 m LVOT/AV VTI ratio: 0.40 MITRAL VALVE MV Area VTI:  1.07 cm    SHUNTS MV Peak grad: 10.2 mmHg   Systemic VTI:  0.14 m MV Mean grad: 6.0 mmHg    Systemic Diam: 1.80 cm MV Vmax:      1.60 m/s MV Vmean:     112.0 cm/s Epifanio Lesches MD Electronically signed by Epifanio Lesches MD Signature Date/Time: 06/12/2023/3:04:41 PM    Final    MR BRAIN WO CONTRAST Result Date:  06/10/2023 CLINICAL DATA:  Acute neurologic deficit EXAM: MRI HEAD WITHOUT CONTRAST TECHNIQUE: Multiplanar, multiecho pulse sequences of the brain and surrounding structures were obtained without intravenous contrast. COMPARISON:  12/10/2007 FINDINGS: Brain: There is a small acute infarcts affecting the posterior left insula and the left temporal lobe. Chronic microhemorrhage in the left frontal lobe. There is multifocal hyperintense T2-weighted signal within the white matter. Parenchymal volume and CSF spaces are normal. 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 Vascular: Normal flow voids. Skull and upper cervical spine: Normal calvarium and skull base. Visualized upper cervical spine and soft tissues are normal. Sinuses/Orbits:No paranasal sinus fluid levels or advanced mucosal thickening. No mastoid or middle ear effusion. Normal orbits. IMPRESSION: 1. Small acute infarcts affecting the posterior left insula and the left temporal lobe. No hemorrhage or mass effect. 2. 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 Electronically Signed   By: Deatra Robinson M.D.   On: 06/10/2023 02:50   CT ANGIO HEAD NECK W WO CM W PERF (CODE STROKE) Result Date: 06/09/2023 CLINICAL DATA:  Neuro deficit, acute, stroke suspected. Slurred speech. Facial droop. EXAM: CT ANGIOGRAPHY HEAD AND NECK CT PERFUSION BRAIN TECHNIQUE: Multidetector CT imaging of the head and neck was performed using the standard protocol during bolus administration of intravenous contrast. Multiplanar CT image reconstructions and MIPs were obtained to evaluate the vascular anatomy. Carotid stenosis measurements (when applicable) are obtained utilizing NASCET criteria, using the distal internal carotid diameter as the denominator. Multiphase CT imaging of the brain was performed following IV bolus contrast injection. Subsequent parametric perfusion maps were calculated using  RAPID software. RADIATION DOSE REDUCTION: This exam was performed according to the departmental dose-optimization program which includes automated exposure control, adjustment of the mA and/or kV according to patient size and/or use of iterative reconstruction technique. CONTRAST:  OMNIPAQUE IOHEXOL 350 MG/ML SOLN COMPARISON:  None Available. Head CT immediately prior FINDINGS: CTA NECK FINDINGS Aortic arch: Normal appearance with normal branching pattern. Right carotid system: Common carotid artery is tortuous but widely patent to the bifurcation. No bifurcation atherosclerotic disease. Cervical ICA widely patent. Left carotid system: Left common carotid artery widely patent but tortuous. No carotid bifurcation disease. Cervical ICA is normal. Vertebral arteries: Right vertebral artery takes an early origin from the proximal subclavian artery and is then widely patent through the cervical region to the foramen magnum. Left vertebral artery  origin is normal and the vessel appears normal through the cervical region. Skeleton: Ordinary cervical spondylosis. Other neck: No mass or lymphadenopathy. Previous thyroidectomy. Upper chest: Mild scarring at the left lung apex. No apparent change since June of 2023. Review of the MIP images confirms the above findings CTA HEAD FINDINGS Anterior circulation: Both internal carotid arteries are widely patent through the skull base and siphon regions. Minimal siphon atherosclerotic calcification but no stenosis. Anterior cerebral arteries are patent and normal. Right middle cerebral artery is normal. Focal embolus within the left inferior division M2 branch with diminished distal perfusion. Posterior circulation: Both vertebral arteries widely patent through the foramen magnum to the basilar artery. Diminutive but widely patent basilar artery. Posterior circulation branch vessels are normal. Dominant anterior circulation supply of the right PCA. Venous sinuses: Patent and  normal. Anatomic variants: None other significant. Review of the MIP images confirms the above findings CT Brain Perfusion Findings: ASPECTS: 10 CBF (<30%) Volume: 49mL Perfusion (Tmax>6.0s) volume: 53mL Mismatch Volume: 4mL Infarction Location:Left parietal lobe. IMPRESSION: 1. Focal embolus within the left inferior division M2 branch with diminished distal perfusion. 2. 49 mL of core infarct in the left parietal lobe. 53 mL of ischemic penumbra. 4 mL of mismatch. 3. No other significant vascular finding. Tortuous common carotid arteries bilaterally. No carotid bifurcation disease. 4. Early origin of the right vertebral artery from the proximal subclavian artery. This is a normal anatomic variant. 5. These results were communicated to Dr. Amada Jupiter at 11:45 am on 06/09/2023 by text page via the [facility] messaging system. Electronically Signed   By: Paulina Fusi M.D.   On: 06/09/2023 11:45   CT HEAD CODE STROKE WO CONTRAST Result Date: 06/09/2023 CLINICAL DATA:  Code stroke. Slurred speech, right-sided weakness, facial droop EXAM: CT HEAD WITHOUT CONTRAST TECHNIQUE: Contiguous axial images were obtained from the base of the skull through the vertex without intravenous contrast. RADIATION DOSE REDUCTION: This exam was performed according to the departmental dose-optimization program which includes automated exposure control, adjustment of the mA and/or kV according to patient size and/or use of iterative reconstruction technique. COMPARISON:  03/06/2012 CT head FINDINGS: Brain: No evidence of acute infarction, hemorrhage, mass, mass effect, or midline shift. No hydrocephalus or extra-axial collection. Redemonstrated encephalomalacia in the left parietal and occipital lobes. Advanced atrophy for age. Partial empty sella. Normal craniocervical junction. Vascular: Minimal hyperdensity in the left insular M2 (series 3, image 14). No other hyperdense vessel. Atherosclerotic calcifications in the intracranial carotid  arteries. Skull: Negative for fracture or focal lesion. Sinuses/Orbits: Mild mucosal thickening in the paranasal sinuses. No acute finding in the orbits. Other: The mastoid air cells are well aerated. ASPECTS [facility] Stroke Program Early CT Score) - Ganglionic level infarction (caudate, lentiform nuclei, internal capsule, insula, M1-M3 cortex): 7 - Supraganglionic infarction (M4-M6 cortex): 3 Total score (0-10 with 10 being normal): 10 IMPRESSION: 1. No acute intracranial hemorrhage. ASPECTS is 10. 2. Minimal hyperdensity in the left insular M2, which could represent thrombus. 3. Redemonstrated encephalomalacia in the left parietal and occipital lobes. Imaging results were communicated on 06/09/2023 at 11:10 am to provider Dr. Amada Jupiter via secure text paging. Electronically Signed   By: Wiliam Ke M.D.   On: 06/09/2023 11:10   ECHOCARDIOGRAM COMPLETE Result Date: 06/06/2023    ECHOCARDIOGRAM REPORT   Patient Name:   JAKE ZARRELLA Date of Exam: 06/06/2023 Medical Rec #:  [id number]        Height:       57.0 in Accession #:  [id number]       Weight:       181.2 lb Date of Birth:  December 06, 1960        BSA:          1.725 m Patient Age:    62 years         BP:           96/62 mmHg Patient Gender: F                HR:           72 bpm. Exam Location:  Outpatient Procedure: 2D Echo, Intracardiac Opacification Agent, Color Doppler and Cardiac            Doppler Indications:    CHF  History:        Patient has prior history of Echocardiogram examinations, most                 recent 05/25/2022. CHF and Cardiomyopathy, S/P TAVR, Pulmonary                 HTN; Risk Factors:Hypertension.                  Mitral Valve: 26 mm valve is present in the mitral position.                 Procedure Date: 12/2008.  Sonographer:    Webb Laws Referring Phys: [id number] [place] LEE IMPRESSIONS  1. Left ventricular ejection fraction, by estimation, is 35 to 40%. The left ventricle has moderately decreased function. The left  ventricle demonstrates regional wall motion abnormalities (see scoring diagram/findings for description). There is mild asymmetric left ventricular hypertrophy of the septal segment. Left ventricular diastolic function could not be evaluated. Elevated left ventricular end-diastolic pressure.  2. Right ventricular systolic function is normal. The right ventricular size is normal. Tricuspid regurgitation signal is inadequate for assessing PA pressure.  3. The mitral valve has been repaired/replaced. Trivial mitral valve regurgitation. At risk for mild mitral mitral stenosis. There is a 26 mm present in the mitral position. Procedure Date: 12/2008.  4. The aortic valve is normal in structure. Aortic valve regurgitation is not visualized. No aortic stenosis is present.  5. The inferior vena cava is normal in size with greater than 50% respiratory variability, suggesting right atrial pressure of 3 mmHg. FINDINGS  Left Ventricle: Left ventricular ejection fraction, by estimation, is 35 to 40%. The left ventricle has moderately decreased function. The left ventricle demonstrates regional wall motion abnormalities. The left ventricular internal cavity size was normal in size. There is mild asymmetric left ventricular hypertrophy of the septal segment. Left ventricular diastolic function could not be evaluated due to mitral valve repair. Left ventricular diastolic function could not be evaluated. Elevated left ventricular end-diastolic pressure.  LV Wall Scoring: The entire apex is akinetic. The anterior wall, antero-lateral wall, anterior septum, inferior wall, posterior wall, mid inferoseptal segment, and basal inferoseptal segment are hypokinetic. Right Ventricle: The right ventricular size is normal. No increase in right ventricular wall thickness. Right ventricular systolic function is normal. Tricuspid regurgitation signal is inadequate for assessing PA pressure. Left Atrium: Left atrial size was normal in size. Right  Atrium: Right atrial size was normal in size. Pericardium: There is no evidence of pericardial effusion. Presence of epicardial fat layer. Mitral Valve: The mitral valve has been repaired/replaced. Trivial mitral valve regurgitation. There is a 26 mm present in the mitral position. Procedure Date: 12/2008. At risk for  mild mitral mitral valve stenosis. MV peak gradient, 6.5 mmHg. The mean mitral valve gradient is 3.0 mmHg. Tricuspid Valve: The tricuspid valve is normal in structure. Tricuspid valve regurgitation is not demonstrated. No evidence of tricuspid stenosis. Aortic Valve: The aortic valve is normal in structure. Aortic valve regurgitation is not visualized. No aortic stenosis is present. Aortic valve mean gradient measures 12.6 mmHg. Aortic valve peak gradient measures 21.6 mmHg. Aortic valve area, by VTI measures 0.81 cm. Pulmonic Valve: The pulmonic valve was normal in structure. Pulmonic valve regurgitation is not visualized. No evidence of pulmonic stenosis. Aorta: The aortic root is normal in size and structure. Venous: The inferior vena cava is normal in size with greater than 50% respiratory variability, suggesting right atrial pressure of 3 mmHg. IAS/Shunts: No atrial level shunt detected by color flow Doppler.  LEFT VENTRICLE PLAX 2D LVIDd:         5.30 cm      Diastology LVIDs:         4.40 cm      LV e' medial:    4.57 cm/s LV PW:         0.70 cm      LV E/e' medial:  22.1 LV IVS:        1.10 cm      LV e' lateral:   4.46 cm/s LVOT diam:     1.90 cm      LV E/e' lateral: 22.6 LV SV:         41 LV SV Index:   24 LVOT Area:     2.84 cm  LV Volumes (MOD) LV vol d, MOD A2C: 126.0 ml LV vol d, MOD A4C: 153.0 ml LV vol s, MOD A2C: 83.3 ml LV vol s, MOD A4C: 100.0 ml LV SV MOD A2C:     42.7 ml LV SV MOD A4C:     153.0 ml LV SV MOD BP:      50.9 ml RIGHT VENTRICLE RV Basal diam:  2.40 cm RV S prime:     4.90 cm/s LEFT ATRIUM             Index        RIGHT ATRIUM          Index LA diam:        4.90 cm  2.84 cm/m   RA Area:     6.02 cm LA Vol (A2C):   33.5 ml 19.42 ml/m  RA Volume:   7.74 ml  4.49 ml/m LA Vol (A4C):   38.7 ml 22.44 ml/m LA Biplane Vol: 38.3 ml 22.21 ml/m  AORTIC VALVE AV Area (Vmax):    0.81 cm AV Area (Vmean):   0.76 cm AV Area (VTI):     0.81 cm AV Vmax:           232.40 cm/s AV Vmean:          166.200 cm/s AV VTI:            0.511 m AV Peak Grad:      21.6 mmHg AV Mean Grad:      12.6 mmHg LVOT Vmax:         66.10 cm/s LVOT Vmean:        44.700 cm/s LVOT VTI:          0.146 m LVOT/AV VTI ratio: 0.29  AORTA Ao Root diam: 2.30 cm Ao Asc diam:  3.30 cm MITRAL VALVE MV Area (PHT): 2.91 cm  SHUNTS MV Area VTI:   0.70 cm     Systemic VTI:  0.15 m MV Peak grad:  6.5 mmHg     Systemic Diam: 1.90 cm MV Mean grad:  3.0 mmHg MV Vmax:       1.27 m/s MV Vmean:      78.8 cm/s MV Decel Time: 261 msec MV E velocity: 101.00 cm/s MV A velocity: 123.00 cm/s MV E/A ratio:  0.82 Kardie Tobb DO Electronically signed by Thomasene Ripple DO Signature Date/Time: 06/06/2023/3:55:36 PM    Final      PHYSICAL EXAM  Temp:  [97.5 F (36.4 C)-99.3 F (37.4 C)] 98.6 F (37 C) (01/21 1600) Pulse Rate:  [71-88] 73 (01/21 1900) Resp:  [12-31] 19 (01/21 1900) BP: (63-122)/(28-109) 90/66 (01/21 1900) SpO2:  [97 %-100 %] 100 % (01/21 1900) FiO2 (%):  [28 %-60 %] 28 % (01/21 1116)  General - Well nourished, well developed, intubated off sedation.  Ophthalmologic - fundi not visualized due to noncooperation.  Cardiovascular - Regular rate and rhythm.  Neuro - awake, alert, eyes open, intubated but following all simple commands. No gaze palsy, tracking bilaterally, blinking to visual threat bilaterally, PERRL. Facial symmetry not able to test due to ET tube. Tongue midline. Bilateral UEs 5/5, no drift. Bilaterally LEs 5/5, no drift. Sensation symmetrical bilaterally subjectively, b/l FTN intact, gait not tested.    ASSESSMENT/PLAN Ms. Donneisha Lummis is a 63 y.o. 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 No TNK given due to outside window.    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 Old left prietal and occipital encephalomalacia CTA head and neck left M2 thorombus MRI  left MCA patchy infarcts, old left occipital and b/l cerebellar infarcts CTP 49/53 2D Echo  EF 35-40%, stable TEE No clear source of embolism seen. Bubble study was not done.  LDL 40 HgbA1c 5.9 UDS neg SCDs for VTE prophylaxis Eliquis (apixaban) daily and ASA 81 prior to admission, now [facility] on hold. Plan for resume AC this pm.   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 Bubble study was not done.  Plan for resuming AC this pm  Hypotension BP on the low end Orthostatic vital consistent with hypotension without positional change On Coreg for GDMT therapy Avoid low BP Long term BP goal normotensive  Hyperlipidemia Home meds:  crestor 20  LDL 40, goal < 70 Now on crestor 20 Continue statin at discharge  Other Stroke Risk Factors Obesity, Body mass index is 38.64 kg/m.  Hx stroke on imaging - old left occipital and b/l cerebellar infarcts on MRI  Other Active Problems Cognitive impairment - vascular dementia CKD 3a, Cre 1.70->1.54->1.31-> 1.38->1.52 Flu infection - PCR + but asymptomatic GIB Thyroid cancer  Hospital day # 4  This patient is critically ill due to stroke with MCA thrombus, respiratory failure, CHF and at significant  risk of neurological worsening, death form recurrent stroke, hemorrhagic conversion, heart failure,  seizure. This patient's care requires constant monitoring of vital signs, hemodynamics, respiratory and cardiac monitoring, review of multiple databases, neurological assessment, discussion with family, other specialists and medical decision making of high complexity. I spent 35 minutes of neurocritical care time in the care of this patient.  Marvel Plan, MD PhD Stroke Neurology 06/13/2023 7:08 PM    To contact Stroke Continuity provider, please refer to [email or web address]. After hours, contact General Neurology

## 2023-06-13 NOTE — Progress Notes (Signed)
Cardiology Progress Note  Patient ID: [name] MRN: [id number] DOB: 12/03/1960 Date of Encounter: 06/13/2023 Primary Cardiologist: None  Subjective   Chief Complaint: None  HPI: Intubated during transesophageal echo yesterday.  No source of thrombus identified.   ROS:  All other ROS reviewed and negative. Pertinent positives noted in the HPI.     Telemetry  Overnight telemetry shows sinus rhythm 70s, which I personally reviewed.    Physical Exam   Vitals:   06/13/23 0900 06/13/23 1000 06/13/23 1005 06/13/23 1200  BP: 116/84 118/83    Pulse: 80     Resp: (!) 24 (!) 22 (!) 24   Temp:    97.6 F (36.4 C)  TempSrc:    Oral  SpO2: 100%  100%   Weight:      Height:        Intake/Output Summary (Last 24 hours) at 06/13/2023 1257 Last data filed at 06/13/2023 1000 Gross per 24 hour  Intake 968.46 ml  Output 525 ml  Net 443.46 ml       06/09/2023   11:43 AM 06/09/2023   10:00 AM 05/04/2023    1:23 PM  Last 3 Weights  Weight (lbs) 178 lb 9.2 oz 178 lb 9.2 oz 181 lb 3.2 oz  Weight (kg) 81 kg 81 kg 82.192 kg    Body mass index is 38.64 kg/m.  General: Well nourished, well developed, in no acute distress Head: Atraumatic, normal size  Eyes: PEERLA, EOMI  Neck: Supple, no JVD Endocrine: No thryomegaly Cardiac: Normal S1, S2; RRR; no murmurs, rubs, or gallops Lungs: Clear to auscultation bilaterally, no wheezing, rhonchi or rales  Abd: Soft, nontender, no hepatomegaly  Ext: No edema, pulses 2+ Musculoskeletal: No deformities, BUE and BLE strength normal and equal Skin: Warm and dry, no rashes   Neuro: Alert and oriented to person, place, time, and situation, CNII-XII grossly intact, no focal deficits  Psych: Normal mood and affect   Cardiac Studies  TEE 06/12/2023  1. Left ventricular ejection fraction, by estimation, is 35 to 40%. The  left ventricle has moderately decreased function.   2. Right ventricular systolic function is mildly reduced. The right   ventricular size is normal.   3. No left atrial/left atrial appendage thrombus was detected.   4. The mitral valve has been repaired/replaced. Trivial mitral valve  regurgitation. The mean mitral valve gradient is 6.0 mmHg with average  heart rate of 80 bpm. There is a 26 mm Sorin Memo prosthetic annuloplasty  ring present in the mitral position.  Procedure Date: 12/23/08.   5. The aortic valve has been repaired/replaced. Aortic valve  regurgitation is trivial. There is a 23 mm Sapien prosthetic (TAVR) valve  present in the aortic position. Procedure Date: 11/21/17. Aortic valve mean  gradient measures 9.0 mmHg.   Patient Profile  [name] is a 63 y.o. 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  She underwent transesophageal echo on 06/12/2023 and suffered acute hypoxic respiratory failure secondary to reactive airway disease.  She is now extubated and doing well.  Assessment & Plan   # Acute M2 branch stroke # Old small vessel infarcts in the left occipital and bilateral cerebral hemispheres -Admitted with acute stroke.  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  She is also on aspirin. -Transesophageal echo showed no evidence of intracardiac thrombus.  TAVR prosthesis appears  normal.  There is no issues with her mitral valve repair. -Given her stroke and cannot fully exclude cardioembolic source I believe we should switch her to Eliquis.  I am in favor of Pradaxa.  This would be a different mechanism for her.  I think this makes the most sense.  I cannot fully exclude small vessel cerebrovascular disease but neurology has concerns for cardioembolic source.  # Chronic systolic heart failure, EF 35 to 40% -Nonischemic etiology. -No signs of volume overload. -Continue carvedilol 3.125 mg twice daily. -She has a slight AKI just extubated.  We  will work to get her back on GDMT for the next day or so. -Continue Farxiga 10 mg daily.  # Acute hypoxic respiratory failure # Influenza -Likely had bronchospasm during transesophageal echo.  Was intubated andextubated.  Seems to be recovering well.  # Status post TAVR # Mitral valve repair -No source of thrombus.  Has had history of leaflet thrombosis.  Transition to Pradaxa as above.  Continue aspirin.  # CKD stage IIIa # AKI -Related to complex respiratory failure.  Likely will improve.  Holding Entresto and Aldactone for now.      For questions or updates, please contact [facility] Please consult [email or web address] for contact info under       Signed, Gerri Spore T. Flora Lipps, MD, [facility] [place]  [facility]  06/13/2023 12:57 PM

## 2023-06-14 ENCOUNTER — Other Ambulatory Visit (HOSPITAL_COMMUNITY): Payer: Self-pay

## 2023-06-14 DIAGNOSIS — I63412 Cerebral infarction due to embolism of left middle cerebral artery: Secondary | ICD-10-CM | POA: Diagnosis not present

## 2023-06-14 DIAGNOSIS — I6310 Cerebral infarction due to embolism of unspecified precerebral artery: Secondary | ICD-10-CM | POA: Diagnosis not present

## 2023-06-14 DIAGNOSIS — I639 Cerebral infarction, unspecified: Secondary | ICD-10-CM | POA: Diagnosis not present

## 2023-06-14 DIAGNOSIS — I5022 Chronic systolic (congestive) heart failure: Secondary | ICD-10-CM | POA: Diagnosis not present

## 2023-06-14 LAB — CBC
HCT: 35.9 % — ABNORMAL LOW (ref 36.0–46.0)
Hemoglobin: 12 g/dL (ref 12.0–15.0)
MCH: 28.9 pg (ref 26.0–34.0)
MCHC: 33.4 g/dL (ref 30.0–36.0)
MCV: 86.5 fL (ref 80.0–100.0)
Platelets: 114 10*3/uL — ABNORMAL LOW (ref 150–400)
RBC: 4.15 MIL/uL (ref 3.87–5.11)
RDW: 15.2 % (ref 11.5–15.5)
WBC: 4.6 10*3/uL (ref 4.0–10.5)
nRBC: 0 % (ref 0.0–0.2)

## 2023-06-14 LAB — BASIC METABOLIC PANEL
Anion gap: 7 (ref 5–15)
BUN: 17 mg/dL (ref 8–23)
CO2: 20 mmol/L — ABNORMAL LOW (ref 22–32)
Calcium: 7.9 mg/dL — ABNORMAL LOW (ref 8.9–10.3)
Chloride: 107 mmol/L (ref 98–111)
Creatinine, Ser: 1.08 mg/dL — ABNORMAL HIGH (ref 0.44–1.00)
GFR, Estimated: 58 mL/min — ABNORMAL LOW (ref >60)
Glucose, Bld: 92 mg/dL (ref 70–99)
Potassium: 4 mmol/L (ref 3.5–5.1)
Sodium: 134 mmol/L — ABNORMAL LOW (ref 135–145)

## 2023-06-14 LAB — MAGNESIUM: Magnesium: 2.6 mg/dL — ABNORMAL HIGH (ref 1.7–2.4)

## 2023-06-14 LAB — PHOSPHORUS: Phosphorus: 2.2 mg/dL — ABNORMAL LOW (ref 2.5–4.6)

## 2023-06-14 MED ORDER — MIDODRINE HCL 5 MG PO TABS
5.0000 mg | ORAL_TABLET | Freq: Three times a day (TID) | ORAL | Status: DC
  Administered 2023-06-14: 5 mg via ORAL
  Filled 2023-06-14: qty 1

## 2023-06-14 MED ORDER — MIDODRINE HCL 5 MG PO TABS
10.0000 mg | ORAL_TABLET | Freq: Three times a day (TID) | ORAL | Status: DC
  Administered 2023-06-15 (×2): 10 mg via ORAL
  Filled 2023-06-14 (×2): qty 2

## 2023-06-14 MED ORDER — SODIUM PHOSPHATES 45 MMOLE/15ML IV SOLN
15.0000 mmol | Freq: Once | INTRAVENOUS | Status: AC
  Administered 2023-06-14: 15 mmol via INTRAVENOUS
  Filled 2023-06-14: qty 5

## 2023-06-14 NOTE — Evaluation (Signed)
Physical Therapy Evaluation Patient Details Name: [name] MRN: [id number] DOB: [birth year] Today's Date: 06/14/2023  History of Present Illness  63 y.o. female presenting 06/09/23 with right-sided weakness, fall, slurred speech. 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 Intubated 1/20-1/21 due to resp distress w/ hypoxia during TEE. 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 Pt initially evaled by PT 1/18 and found to not require any further acute care PT services with rec for HHPT. On 1/20 pt went for TEE. During the procedure she developed resp distress w/ hypoxia and was emergently intubated (thought to be from bronchospasm, there was also concern for aspiration). Intubated 1/20-1/21. On 1/21, episode of R facial droop and RUE weakness, which neuro relates to orthostatic hypotension. Repeat CT stable, no new acute findings. R side symptoms resolved. Pt currently with functional limitations due to the deficits listed below (see PT Problem List). On eval, +orthostatics persist, limiting mobility. She required CGA bed mobility, CGA sit to stand, and CGA pivot transfers without AD. Spoke with pt about ted hose. She reports having compression stockings at home and having worn them previously. Pt will benefit from acute skilled PT to increase their independence and safety with mobility to allow discharge. Upon d/c, pt would benefit from HHPT.                                              BP Supine                           109/94 Sit                                    95/59 Stand                               86/64 Recliner,feet  elevated    94/69         If plan is discharge home, recommend the following: Assistance with cooking/housework;Direct supervision/assist for medications management;Direct supervision/assist for financial management;Supervision due to cognitive status   Can travel by private vehicle        Equipment Recommendations None recommended by PT  Recommendations for Other Services       Functional Status Assessment Patient has had a recent decline in their functional status and demonstrates the ability to make significant improvements in function in a reasonable and predictable amount of time.     Precautions / Restrictions Precautions Precautions: Fall;Other (comment) Precaution Comments: fell when had CVA; landed on her rt side, +orthostatics      Mobility  Bed Mobility Overal bed mobility: Needs Assistance Bed Mobility: Supine to Sit     Supine to sit: Contact guard, Used rails          Transfers Overall transfer level: Needs assistance Equipment used: None Transfers: Sit to/from Stand, Bed to chair/wheelchair/BSC Sit to Stand: Contact guard assist   Step pivot transfers: Contact guard  assist       General transfer comment: step pivot bed>BSC>recliner    Ambulation/Gait               General Gait Details: unable due to +orthostatics  Stairs            Wheelchair Mobility     Tilt Bed    Modified Rankin (Stroke Patients Only) Modified Rankin (Stroke Patients Only) Pre-Morbid Rankin Score: No symptoms Modified Rankin: Moderately severe disability     Balance Overall balance assessment: No apparent balance deficits (not formally assessed)                                           Pertinent Vitals/Pain Pain Assessment Pain Assessment: No/denies pain    Home Living Family/patient expects to be discharged to:: Private residence Living Arrangements: Children (daughter) Available Help at Discharge: Family;Available 24  hours/day Type of Home: Apartment Home Access: Level entry       Home Layout: One level Home Equipment: None      Prior Function Prior Level of Function : Independent/Modified Independent;Driving             Mobility Comments: no AD use ADLs 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, ind with ADL     Extremity/Trunk Assessment   Upper Extremity Assessment Upper Extremity Assessment: Defer to OT evaluation    Lower Extremity Assessment Lower Extremity Assessment: Overall WFL for tasks assessed    Cervical / Trunk Assessment Cervical / Trunk Assessment: Other exceptions Cervical / Trunk Exceptions: overweight  Communication   Communication Communication: No apparent difficulties  Cognition Arousal: Alert Behavior During Therapy: WFL for tasks assessed/performed Overall Cognitive Status: Impaired/Different from baseline Area of Impairment: Problem solving, Memory, Awareness                     Memory: Decreased short-term memory     Awareness: Emergent Problem Solving: Difficulty sequencing, Requires verbal cues          General Comments General comments (skin integrity, edema, etc.): BP: supine 109/94, sit 95/59, stand 86/64, recliner with feet elevated 94/69    Exercises     Assessment/Plan    PT Assessment Patient needs continued PT services  PT Problem List Decreased strength;Decreased balance;Decreased cognition;Decreased mobility;Cardiopulmonary status limiting activity       PT Treatment Interventions Gait training;Functional mobility training;Balance training;Stair training;Therapeutic exercise;Therapeutic activities;Patient/family education    PT Goals (Current goals can be found in the Care Plan section)  Acute Rehab PT Goals Patient Stated Goal: home PT Goal Formulation: With patient Time For Goal Achievement: 06/28/23 Potential to Achieve Goals: Good    Frequency Min 1X/week     Co-evaluation                AM-PAC PT "6 Clicks" Mobility  Outcome Measure Help needed turning from your back to your side while in a flat bed without using bedrails?: None Help needed moving from lying on your back to sitting on the side of a flat bed without using bedrails?: A Little Help needed moving to and from a bed to a chair (including a wheelchair)?: A Little Help needed standing up from a chair using your arms (e.g., wheelchair or bedside chair)?: A Little Help needed to walk in hospital room?: A Little Help needed climbing 3-5 steps with a railing? : A Little 6 Click  Score: 19    End of Session Equipment Utilized During Treatment: Gait belt Activity Tolerance: Treatment limited secondary to medical complications (Comment) (+orthostatics) Patient left: in chair;with call bell/phone within reach Nurse Communication: Mobility status PT Visit Diagnosis: History of falling (Z91.81);Difficulty in walking, not elsewhere classified (R26.2)    Time: 5784-6962 PT Time Calculation (min) (ACUTE ONLY): 24 min   Charges:   PT Evaluation $PT Eval Moderate Complexity: 1 Mod             Ferd Glassing., PT  Office # [phone]   Ilda Foil 06/14/2023, 12:47 PM

## 2023-06-14 NOTE — Discharge Instructions (Addendum)
Information on my medicine - Pradaxa (dabigatran)  This medication education was reviewed with me or my healthcare representative as part of my discharge preparation.    Why was Pradaxa prescribed for you? Pradaxa was prescribed for you to reduce the risk of forming blood clots that cause a stroke if you have a medical condition called atrial fibrillation (a type of irregular heartbeat).  This replaces you Eliquis (Apixaban).  STOP the Eliquis.  What do you Need to know about PradAXa? Take your Pradaxa TWICE DAILY - one capsule in the morning and one tablet in the evening with or without food.  It would be best to take the doses about the same time each day.  The capsules should not be broken, chewed or opened - they must be swallowed whole.  Do not store Pradaxa in other medication containers - once the bottle is opened the Pradaxa should be used within FOUR months; throw away any capsules that haven't been by that time.  Take Pradaxa exactly as prescribed by your doctor.  DO NOT stop taking Pradaxa without talking to the doctor who prescribed the medication.  Stopping without other stroke prevention medication to take the place of Pradaxa may increase your risk of developing a clot that causes a stroke.  Refill your prescription before you run out.  After discharge, you should have regular check-up appointments with your healthcare provider that is prescribing your Pradaxa.  In the future your dose may need to be changed if your kidney function or weight changes by a significant amount.  What do you do if you miss a dose? If you miss a dose, take it as soon as you remember on the same day.  If your next dose is less than 6 hours away, skip the missed dose.  Do not take two doses of PRADAXA at the same time.  Important Safety Information A possible side effect of Pradaxa is bleeding. You should call your healthcare provider right away if you experience any of the following: Bleeding  from an injury or your nose that does not stop. Unusual colored urine (red or dark brown) or unusual colored stools (red or black). Unusual bruising for unknown reasons. A serious fall or if you hit your head (even if there is no bleeding).  Some medicines may interact with Pradaxa and might increase your risk of bleeding or clotting while on Pradaxa. To help avoid this, consult your healthcare provider or pharmacist prior to using any new prescription or non-prescription medications, including herbals, vitamins, non-steroidal anti-inflammatory drugs (NSAIDs) and supplements.  This website has more information on Pradaxa (dabigatran): [email or web address]://www.pradaxa.com   Dear [name],   Thank you so much for allowing [place] to be part of your care!  You were admitted to [facility] for stroke   You were treated for this with assistance from neurology. Continue with Pradaxa and aspirin on discharge.    POST-HOSPITAL & CARE INSTRUCTIONS Follow up with PCP Please let PCP/Specialists know of any changes that were made.  Please see medications section of this packet for any medication changes.   DOCTOR'S APPOINTMENT & FOLLOW UP CARE INSTRUCTIONS  Future Appointments  Date Time Provider Department Center  06/19/2023  1:30 PM Darral Dash, DO [facility]  06/26/2023 10:30 AM [facility] PA/NP [facility] None    RETURN PRECAUTIONS: Worsening chest pain Shortness of breath Swelling in the extremities  Worsening unilateral weakness   Take care and be well!  Family Medicine Teaching Service  [facility]  [facility]  [address] [place], [place] [address] [phone]

## 2023-06-14 NOTE — Assessment & Plan Note (Addendum)
Euvolemic on exam. Caution restarting GDMT in setting of recent CVA and soft blood pressures. Holding Entresto, Carvedilol, and spironolactone. - Cardiology consulted, appreciate recommendations - Restart GDMT cautiously; restarted [place]

## 2023-06-14 NOTE — Progress Notes (Signed)
STROKE TEAM PROGRESS NOTE   SUBJECTIVE (INTERVAL HISTORY) No family at bedside.  Patient reclining in chair, still has low BP 90s.  She worked with PT and OT asymptomatic on positional changes but still has lower BP on standing.  Put on midodrine for better BP   OBJECTIVE Temp:  [97.5 F (36.4 C)-97.7 F (36.5 C)] 97.6 F (36.4 C) (01/22 2000) Pulse Rate:  [65-78] 78 (01/22 2000) Cardiac Rhythm: Normal sinus rhythm (01/22 0800) Resp:  [14-27] 22 (01/22 2000) BP: (82-114)/(51-96) 96/73 (01/22 2000) SpO2:  [92 %-100 %] 98 % (01/22 2000)  Recent Labs  Lab 06/09/23 1054 06/12/23 0840  GLUCAP 96 90   Recent Labs  Lab 06/11/23 0555 06/12/23 0453 06/12/23 1358 06/12/23 1525 06/12/23 1840 06/13/23 0520 06/14/23 0513  NA 138 138 139 140 138 140 134*  K 4.6 4.0 7.2* 4.1 5.1 4.6 4.0  CL 110 106  --   --  108 112* 107  CO2 20* 22  --   --  20* 16* 20*  GLUCOSE 84 83  --   --  145* 124* 92  BUN 10 11  --   --  11 16 17   CREATININE 1.31* 1.38*  --   --  1.35* 1.52* 1.08*  CALCIUM 8.7* 8.8*  --   --  8.4* 8.2* 7.9*  MG 2.8* 2.6*  --   --  2.6* 2.6* 2.6*  PHOS  --   --   --   --  3.2  --  2.2*   Recent Labs  Lab 06/09/23 1053 06/12/23 1840  AST 21 26  ALT 15 16  ALKPHOS 70 76  BILITOT 0.7 1.1  PROT 6.3* 7.3  ALBUMIN 3.2* 3.8   Recent Labs  Lab 06/09/23 1053 06/09/23 1154 06/12/23 1358 06/12/23 1525 06/12/23 1840 06/13/23 0520 06/14/23 0513  WBC 5.2  --   --   --  10.3 6.3 4.6  NEUTROABS 3.2  --   --   --   --   --   --   HGB 13.6   < > 15.0 14.3 14.5 14.6 12.0  HCT 42.5   < > 44.0 42.0 44.9 45.8 35.9*  MCV 90.0  --   --   --  88.2 89.1 86.5  PLT 83*  --   --   --  141* 115* 114*   < > = values in this interval not displayed.   No results for input(s): "CKTOTAL", "CKMB", "CKMBINDEX", "TROPONINI" in the last 168 hours. No results for input(s): "LABPROT", "INR" in the last 72 hours.  No results for input(s): "COLORURINE", "LABSPEC", "PHURINE", "GLUCOSEU",  "HGBUR", "BILIRUBINUR", "KETONESUR", "PROTEINUR", "UROBILINOGEN", "NITRITE", "LEUKOCYTESUR" in the last 72 hours.  Invalid input(s): "APPERANCEUR"     Component Value Date/Time   CHOL 85 06/10/2023 0603   TRIG 96 06/10/2023 0603   HDL 26 (L) 06/10/2023 0603   CHOLHDL 3.3 06/10/2023 0603   VLDL 19 06/10/2023 0603   LDLCALC 40 06/10/2023 0603   Lab Results  Component Value Date   HGBA1C 5.9 (H) 06/10/2023      Component Value Date/Time   LABOPIA NONE DETECTED 06/10/2023 1210   COCAINSCRNUR NONE DETECTED 06/10/2023 1210   LABBENZ NONE DETECTED 06/10/2023 1210   AMPHETMU NONE DETECTED 06/10/2023 1210   THCU NONE DETECTED 06/10/2023 1210   LABBARB NONE DETECTED 06/10/2023 1210    Recent Labs  Lab 06/09/23 1130  ETH <10    I have personally reviewed the radiological images below and  agree with the radiology interpretations.  VAS [place] LOWER EXTREMITY VENOUS (DVT) Result Date: 06/14/2023  Lower Venous DVT Study Patient Name:  RYNE WILMES  Date of Exam:   06/13/2023 Medical Rec #: [id number]         Accession #:    [id number] Date of Birth: Jul 28, 1960         Patient Gender: F Patient Age:   43 years Exam Location:  [facility] Procedure:      VAS [place] LOWER EXTREMITY VENOUS (DVT) Referring Phys: Levon Hedger --------------------------------------------------------------------------------  Indications: Edema, and stroke.  Anticoagulation: Eliquis. Comparison Study: No priors. Performing Technologist: Marilynne Halsted RDMS, RVT  Examination Guidelines: A complete evaluation includes B-mode imaging, spectral Doppler, color Doppler, and power Doppler as needed of all accessible portions of each vessel. Bilateral testing is considered an integral part of a complete examination. Limited examinations for reoccurring indications may be performed as noted. The reflux portion of the exam is performed with the patient in reverse Trendelenburg.   +---------+---------------+---------+-----------+----------+--------------+ RIGHT    CompressibilityPhasicitySpontaneityPropertiesThrombus Aging +---------+---------------+---------+-----------+----------+--------------+ CFV      Full           Yes      Yes                                 +---------+---------------+---------+-----------+----------+--------------+ SFJ      Full                                                        +---------+---------------+---------+-----------+----------+--------------+ FV Prox  Full                                                        +---------+---------------+---------+-----------+----------+--------------+ FV Mid   Full                                                        +---------+---------------+---------+-----------+----------+--------------+ FV DistalFull                                                        +---------+---------------+---------+-----------+----------+--------------+ PFV      Full                                                        +---------+---------------+---------+-----------+----------+--------------+ POP      Full           Yes      Yes                                 +---------+---------------+---------+-----------+----------+--------------+  PTV      Full                                                        +---------+---------------+---------+-----------+----------+--------------+ PERO     Full                                                        +---------+---------------+---------+-----------+----------+--------------+   +---------+---------------+---------+-----------+----------+--------------+ LEFT     CompressibilityPhasicitySpontaneityPropertiesThrombus Aging +---------+---------------+---------+-----------+----------+--------------+ CFV      Full           Yes      Yes                                  +---------+---------------+---------+-----------+----------+--------------+ SFJ      Full                                                        +---------+---------------+---------+-----------+----------+--------------+ FV Prox  Full                                                        +---------+---------------+---------+-----------+----------+--------------+ FV Mid   Full                                                        +---------+---------------+---------+-----------+----------+--------------+ FV DistalFull                                                        +---------+---------------+---------+-----------+----------+--------------+ PFV      Full                                                        +---------+---------------+---------+-----------+----------+--------------+ POP      Full           Yes      Yes                                 +---------+---------------+---------+-----------+----------+--------------+ PTV      Full                                                        +---------+---------------+---------+-----------+----------+--------------+  PERO     Full                                                        +---------+---------------+---------+-----------+----------+--------------+     Summary: BILATERAL: - No evidence of deep vein thrombosis seen in the lower extremities, bilaterally. -No evidence of popliteal cyst, bilaterally.   *See table(s) above for measurements and observations. Electronically signed by Coral Else MD on 06/14/2023 at 6:53:00 PM.    Final    CT HEAD WO CONTRAST ( ) Result Date: 06/14/2023 CLINICAL DATA:  Follow-up examination for stroke. EXAM: CT HEAD WITHOUT CONTRAST TECHNIQUE: Contiguous axial images were obtained from the base of the skull through the vertex without intravenous contrast. RADIATION DOSE REDUCTION: This exam was performed according to the departmental dose-optimization program  which includes automated exposure control, adjustment of the mA and/or kV according to patient size and/or use of iterative reconstruction technique. COMPARISON:  Of prior brain MRI from 06/10/2023 FINDINGS: Brain: Evolving cytotoxic edema involving the posterior left insula and left parieto-occipital region, consistent with evolving left MCA distribution infarct. Overall size and distribution is similar to previous MRI. No hemorrhagic transformation or significant regional mass effect. No other acute intracranial hemorrhage. No new large vessel territory infarct. No mass lesion or midline shift. No hydrocephalus or extra-axial fluid collection. Vascular: No abnormal hyperdense vessel. Skull: Scalp soft tissues within normal limits.  Calvarium intact. Sinuses/Orbits: Globes and orbital soft tissues within normal limits. Mild mucosal thickening about the left frontal sinus. Paranasal sinuses are otherwise clear. No significant mastoid effusion. Other: None. IMPRESSION: 1. 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 As compared to prior no hemorrhagic transformation or significant regional mass effect. 2. No other new acute intracranial abnormality. Electronically Signed   By: Rise Mu M.D.   On: 06/14/2023 04:20   EP STUDY Result Date: 06/12/2023 See surgical note for result.  DG Chest 1 View Result Date: 06/12/2023 CLINICAL DATA:  Shortness of breath EXAM: CHEST  1 VIEW COMPARISON:  11/21/2017 FINDINGS: Low position of endotracheal tube, tip approximately 1 cm above carina directed towards right mainstem bronchus. Low lung volumes with some linear opacities in the right mid and lower lung, and left perihilar and suprahilar airspace opacities. Heart size and mediastinal contours are within normal limits. Post TAVR. Sternotomy wires. Surgical clips at the thoracic inlet. Blunting of left lateral costophrenic angle. IMPRESSION: 1. Low position  of endotracheal tube, tip 1 cm above carina directed towards right mainstem bronchus. Recommend retraction by 2 cm. 2. Low lung volumes with bilateral atelectasis and/or infiltrates. Electronically Signed   By: Corlis Leak M.D.   On: 06/12/2023 15:45   ECHO TEE Result Date: 06/12/2023    TRANSESOPHOGEAL ECHO REPORT   Patient Name:   DELIJAH CASHMAN Date of Exam: 06/12/2023 Medical Rec #:  [id number]        Height:       57.0 in Accession #:    [id number]       Weight:       178.6 lb Date of Birth:  01/12/1961        BSA:          1.714 m Patient Age:    62 years         BP:  104/84 mmHg Patient Gender: F                HR:           84 bpm. Exam Location:  Inpatient Procedure: Transesophageal Echo, Cardiac Doppler, Color Doppler and 3D Echo Indications:     stroke  History:         Patient has prior history of Echocardiogram examinations, most                  recent 06/06/2023. CHF, Pulmonary HTN and chronic kidney                  disease. Rheumatic heart disease.; Risk Factors:Diabetes,                  Former Smoker and Hypertension.                  Aortic Valve: 23 mm Sapien prosthetic, stented (TAVR) valve is                  present in the aortic position. Procedure Date: 11/21/17.                  Mitral Valve: 26 mm Sorin Memo prosthetic annuloplasty ring                  valve is present in the mitral position. Procedure Date:                  12/23/08.  Sonographer:     Delcie Roch RDCS Referring Phys:  [id number] RHONDA G BARRETT Diagnosing Phys: Epifanio Lesches MD PROCEDURE: After discussion of the risks and benefits of a TEE, an informed consent was obtained from the patient. The patient was intubated. The transesophogeal probe was passed without difficulty through the esophogus of the patient. Imaged were obtained with the patient in a supine position. Sedation performed by different physician. The patient was monitored while under deep sedation. The patient developed Respiratory depression  during the procedure.  IMPRESSIONS  1. Left ventricular ejection fraction, by estimation, is 35 to 40%. The left ventricle has moderately decreased function.  2. Right ventricular systolic function is mildly reduced. The right ventricular size is normal.  3. No left atrial/left atrial appendage thrombus was detected.  4. The mitral valve has been repaired/replaced. Trivial mitral valve regurgitation. The mean mitral valve gradient is 6.0 mmHg with average heart rate of 80 bpm. There is a 26 mm Sorin Memo prosthetic annuloplasty ring present in the mitral position. Procedure Date: 12/23/08.  5. The aortic valve has been repaired/replaced. Aortic valve regurgitation is trivial. There is a 23 mm Sapien prosthetic (TAVR) valve present in the aortic position. Procedure Date: 11/21/17. Aortic valve mean gradient measures 9.0 mmHg. Conclusion(s)/Recommendation(s): Abbreviated study as patient developed significant hypoxia and procedure was aborted. Bubble study was not done. No clear source of embolism seen. 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 FINDINGS  Left Ventricle: Left ventricular ejection fraction, by estimation, is 35 to 40%. The left ventricle has moderately decreased function. The left ventricular internal cavity size was normal in size. Right Ventricle: The right ventricular size is normal. Right vetricular wall thickness was not well visualized. Right ventricular systolic function is mildly reduced. Left Atrium: Left atrial size was normal in size. No left atrial/left atrial appendage thrombus was  detected. Right Atrium: Right atrial size was normal in size. Pericardium: There is no evidence of pericardial effusion. Mitral Valve: The mitral valve has been repaired/replaced. Trivial mitral valve regurgitation. There is a 26 mm Sorin Memo prosthetic  annuloplasty ring present in the mitral position. Procedure Date: 12/23/08. MV peak gradient, 10.2 mmHg. The mean mitral valve gradient is 6.0 mmHg with average heart rate of 80 bpm. Tricuspid Valve: The tricuspid valve is normal in structure. Tricuspid valve regurgitation is trivial. Aortic Valve: The aortic valve has been repaired/replaced. Aortic valve regurgitation is trivial. Aortic valve mean gradient measures 9.0 mmHg. Aortic valve peak gradient measures 18.1 mmHg. Aortic valve area, by VTI measures 1.02 cm. There is a 23 mm Sapien prosthetic, stented (TAVR) valve present in the aortic position. Procedure Date: 11/21/17. Pulmonic Valve: The pulmonic valve was not well visualized. Pulmonic valve regurgitation is not visualized. Aorta: The aortic root and ascending aorta are structurally normal, with no evidence of dilitation. IAS/Shunts: No atrial level shunt detected by color flow Doppler.  LEFT VENTRICLE PLAX 2D LVOT diam:     1.80 cm LV SV:         37 LV SV Index:   21 LVOT Area:     2.54 cm  AORTIC VALVE AV Area (Vmax):    0.81 cm AV Area (Vmean):   0.82 cm AV Area (VTI):     1.02 cm AV Vmax:           213.00 cm/s AV Vmean:          135.000 cm/s AV VTI:            0.360 m AV Peak Grad:      18.1 mmHg AV Mean Grad:      9.0 mmHg LVOT Vmax:         68.00 cm/s LVOT Vmean:        43.500 cm/s LVOT VTI:          0.144 m LVOT/AV VTI ratio: 0.40 MITRAL VALVE MV Area VTI:  1.07 cm    SHUNTS MV Peak grad: 10.2 mmHg   Systemic VTI:  0.14 m MV Mean grad: 6.0 mmHg    Systemic Diam: 1.80 cm MV Vmax:      1.60 m/s MV Vmean:     112.0 cm/s Epifanio Lesches MD Electronically signed by Epifanio Lesches MD Signature Date/Time: 06/12/2023/3:04:41 PM    Final    MR BRAIN WO CONTRAST Result Date: 06/10/2023 CLINICAL DATA:  Acute neurologic deficit EXAM: MRI HEAD WITHOUT CONTRAST TECHNIQUE: Multiplanar, multiecho pulse sequences of the brain and surrounding structures were obtained without intravenous contrast.  COMPARISON:  12/10/2007 FINDINGS: Brain: There is a small acute infarcts affecting the posterior left insula and the left temporal lobe. Chronic microhemorrhage in the left frontal lobe. There is multifocal hyperintense T2-weighted signal within the white matter. Parenchymal volume and CSF spaces are normal. 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 Vascular: Normal flow voids. Skull and upper cervical spine: Normal calvarium and skull base. Visualized upper cervical spine and soft tissues are normal. Sinuses/Orbits:No paranasal sinus fluid levels or advanced mucosal thickening. No mastoid or middle ear effusion. Normal orbits. IMPRESSION: 1. Small acute infarcts affecting the posterior left insula and the left temporal lobe. No hemorrhage or mass effect. 2. 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 Electronically Signed   By: Deatra Robinson M.D.   On: 06/10/2023 02:50   CT ANGIO HEAD NECK  W WO CM W PERF (CODE STROKE) Result Date: 06/09/2023 CLINICAL DATA:  Neuro deficit, acute, stroke suspected. Slurred speech. Facial droop. EXAM: CT ANGIOGRAPHY HEAD AND NECK CT PERFUSION BRAIN TECHNIQUE: Multidetector CT imaging of the head and neck was performed using the standard protocol during bolus administration of intravenous contrast. Multiplanar CT image reconstructions and MIPs were obtained to evaluate the vascular anatomy. Carotid stenosis measurements (when applicable) are obtained utilizing NASCET criteria, using the distal internal carotid diameter as the denominator. Multiphase CT imaging of the brain was performed following IV bolus contrast injection. Subsequent parametric perfusion maps were calculated using RAPID software. RADIATION DOSE REDUCTION: This exam was performed according to the departmental dose-optimization program which includes automated exposure control, adjustment of the mA and/or kV according to patient  size and/or use of iterative reconstruction technique. CONTRAST:  OMNIPAQUE IOHEXOL 350 MG/ML SOLN COMPARISON:  None Available. Head CT immediately prior FINDINGS: CTA NECK FINDINGS Aortic arch: Normal appearance with normal branching pattern. Right carotid system: Common carotid artery is tortuous but widely patent to the bifurcation. No bifurcation atherosclerotic disease. Cervical ICA widely patent. Left carotid system: Left common carotid artery widely patent but tortuous. No carotid bifurcation disease. Cervical ICA is normal. Vertebral arteries: Right vertebral artery takes an early origin from the proximal subclavian artery and is then widely patent through the cervical region to the foramen magnum. Left vertebral artery origin is normal and the vessel appears normal through the cervical region. Skeleton: Ordinary cervical spondylosis. Other neck: No mass or lymphadenopathy. Previous thyroidectomy. Upper chest: Mild scarring at the left lung apex. No apparent change since June of 2023. Review of the MIP images confirms the above findings CTA HEAD FINDINGS Anterior circulation: Both internal carotid arteries are widely patent through the skull base and siphon regions. Minimal siphon atherosclerotic calcification but no stenosis. Anterior cerebral arteries are patent and normal. Right middle cerebral artery is normal. Focal embolus within the left inferior division M2 branch with diminished distal perfusion. Posterior circulation: Both vertebral arteries widely patent through the foramen magnum to the basilar artery. Diminutive but widely patent basilar artery. Posterior circulation branch vessels are normal. Dominant anterior circulation supply of the right PCA. Venous sinuses: Patent and normal. Anatomic variants: None other significant. Review of the MIP images confirms the above findings CT Brain Perfusion Findings: ASPECTS: 10 CBF (<30%) Volume: 49mL Perfusion (Tmax>6.0s) volume: 53mL Mismatch  Volume: 4mL Infarction Location:Left parietal lobe. IMPRESSION: 1. Focal embolus within the left inferior division M2 branch with diminished distal perfusion. 2. 49 mL of core infarct in the left parietal lobe. 53 mL of ischemic penumbra. 4 mL of mismatch. 3. No other significant vascular finding. Tortuous common carotid arteries bilaterally. No carotid bifurcation disease. 4. Early origin of the right vertebral artery from the proximal subclavian artery. This is a normal anatomic variant. 5. These results were communicated to Dr. Amada Jupiter at 11:45 am on 06/09/2023 by text page via the [facility] messaging system. Electronically Signed   By: Paulina Fusi M.D.   On: 06/09/2023 11:45   CT HEAD CODE STROKE WO CONTRAST Result Date: 06/09/2023 CLINICAL DATA:  Code stroke. Slurred speech, right-sided weakness, facial droop EXAM: CT HEAD WITHOUT CONTRAST TECHNIQUE: Contiguous axial images were obtained from the base of the skull through the vertex without intravenous contrast. RADIATION DOSE REDUCTION: This exam was performed according to the departmental dose-optimization program which includes automated exposure control, adjustment of the mA and/or kV according to patient size and/or use of iterative reconstruction technique.  COMPARISON:  03/06/2012 CT head FINDINGS: Brain: No evidence of acute infarction, hemorrhage, mass, mass effect, or midline shift. No hydrocephalus or extra-axial collection. Redemonstrated encephalomalacia in the left parietal and occipital lobes. Advanced atrophy for age. Partial empty sella. Normal craniocervical junction. Vascular: Minimal hyperdensity in the left insular M2 (series 3, image 14). No other hyperdense vessel. Atherosclerotic calcifications in the intracranial carotid arteries. Skull: Negative for fracture or focal lesion. Sinuses/Orbits: Mild mucosal thickening in the paranasal sinuses. No acute finding in the orbits. Other: The mastoid air cells are well aerated. ASPECTS [facility]  Stroke Program Early CT Score) - Ganglionic level infarction (caudate, lentiform nuclei, internal capsule, insula, M1-M3 cortex): 7 - Supraganglionic infarction (M4-M6 cortex): 3 Total score (0-10 with 10 being normal): 10 IMPRESSION: 1. No acute intracranial hemorrhage. ASPECTS is 10. 2. Minimal hyperdensity in the left insular M2, which could represent thrombus. 3. Redemonstrated encephalomalacia in the left parietal and occipital lobes. Imaging results were communicated on 06/09/2023 at 11:10 am to provider Dr. Amada Jupiter via secure text paging. Electronically Signed   By: Wiliam Ke M.D.   On: 06/09/2023 11:10   ECHOCARDIOGRAM COMPLETE Result Date: 06/06/2023    ECHOCARDIOGRAM REPORT   Patient Name:   SHAYVON VANBLARICUM Date of Exam: 06/06/2023 Medical Rec #:  [id number]        Height:       57.0 in Accession #:    [id number]       Weight:       181.2 lb Date of Birth:  08-03-1960        BSA:          1.725 m Patient Age:    62 years         BP:           96/62 mmHg Patient Gender: F                HR:           72 bpm. Exam Location:  Outpatient Procedure: 2D Echo, Intracardiac Opacification Agent, Color Doppler and Cardiac            Doppler Indications:    CHF  History:        Patient has prior history of Echocardiogram examinations, most                 recent 05/25/2022. CHF and Cardiomyopathy, S/P TAVR, Pulmonary                 HTN; Risk Factors:Hypertension.                  Mitral Valve: 26 mm valve is present in the mitral position.                 Procedure Date: 12/2008.  Sonographer:    Webb Laws Referring Phys: [id number] [place] LEE IMPRESSIONS  1. Left ventricular ejection fraction, by estimation, is 35 to 40%. The left ventricle has moderately decreased function. The left ventricle demonstrates regional wall motion abnormalities (see scoring diagram/findings for description). There is mild asymmetric left ventricular hypertrophy of the septal segment. Left ventricular diastolic function could  not be evaluated. Elevated left ventricular end-diastolic pressure.  2. Right ventricular systolic function is normal. The right ventricular size is normal. Tricuspid regurgitation signal is inadequate for assessing PA pressure.  3. The mitral valve has been repaired/replaced. Trivial mitral valve regurgitation. At risk for mild mitral mitral stenosis. There is a 26 mm present in the mitral  position. Procedure Date: 12/2008.  4. The aortic valve is normal in structure. Aortic valve regurgitation is not visualized. No aortic stenosis is present.  5. The inferior vena cava is normal in size with greater than 50% respiratory variability, suggesting right atrial pressure of 3 mmHg. FINDINGS  Left Ventricle: Left ventricular ejection fraction, by estimation, is 35 to 40%. The left ventricle has moderately decreased function. The left ventricle demonstrates regional wall motion abnormalities. The left ventricular internal cavity size was normal in size. There is mild asymmetric left ventricular hypertrophy of the septal segment. Left ventricular diastolic function could not be evaluated due to mitral valve repair. Left ventricular diastolic function could not be evaluated. Elevated left ventricular end-diastolic pressure.  LV Wall Scoring: The entire apex is akinetic. The anterior wall, antero-lateral wall, anterior septum, inferior wall, posterior wall, mid inferoseptal segment, and basal inferoseptal segment are hypokinetic. Right Ventricle: The right ventricular size is normal. No increase in right ventricular wall thickness. Right ventricular systolic function is normal. Tricuspid regurgitation signal is inadequate for assessing PA pressure. Left Atrium: Left atrial size was normal in size. Right Atrium: Right atrial size was normal in size. Pericardium: There is no evidence of pericardial effusion. Presence of epicardial fat layer. Mitral Valve: The mitral valve has been repaired/replaced. Trivial mitral valve  regurgitation. There is a 26 mm present in the mitral position. Procedure Date: 12/2008. At risk for mild mitral mitral valve stenosis. MV peak gradient, 6.5 mmHg. The mean mitral valve gradient is 3.0 mmHg. Tricuspid Valve: The tricuspid valve is normal in structure. Tricuspid valve regurgitation is not demonstrated. No evidence of tricuspid stenosis. Aortic Valve: The aortic valve is normal in structure. Aortic valve regurgitation is not visualized. No aortic stenosis is present. Aortic valve mean gradient measures 12.6 mmHg. Aortic valve peak gradient measures 21.6 mmHg. Aortic valve area, by VTI measures 0.81 cm. Pulmonic Valve: The pulmonic valve was normal in structure. Pulmonic valve regurgitation is not visualized. No evidence of pulmonic stenosis. Aorta: The aortic root is normal in size and structure. Venous: The inferior vena cava is normal in size with greater than 50% respiratory variability, suggesting right atrial pressure of 3 mmHg. IAS/Shunts: No atrial level shunt detected by color flow Doppler.  LEFT VENTRICLE PLAX 2D LVIDd:         5.30 cm      Diastology LVIDs:         4.40 cm      LV e' medial:    4.57 cm/s LV PW:         0.70 cm      LV E/e' medial:  22.1 LV IVS:        1.10 cm      LV e' lateral:   4.46 cm/s LVOT diam:     1.90 cm      LV E/e' lateral: 22.6 LV SV:         41 LV SV Index:   24 LVOT Area:     2.84 cm  LV Volumes (MOD) LV vol d, MOD A2C: 126.0 ml LV vol d, MOD A4C: 153.0 ml LV vol s, MOD A2C: 83.3 ml LV vol s, MOD A4C: 100.0 ml LV SV MOD A2C:     42.7 ml LV SV MOD A4C:     153.0 ml LV SV MOD BP:      50.9 ml RIGHT VENTRICLE RV Basal diam:  2.40 cm RV S prime:     4.90 cm/s LEFT ATRIUM  Index        RIGHT ATRIUM          Index LA diam:        4.90 cm 2.84 cm/m   RA Area:     6.02 cm LA Vol (A2C):   33.5 ml 19.42 ml/m  RA Volume:   7.74 ml  4.49 ml/m LA Vol (A4C):   38.7 ml 22.44 ml/m LA Biplane Vol: 38.3 ml 22.21 ml/m  AORTIC VALVE AV Area (Vmax):    0.81 cm AV  Area (Vmean):   0.76 cm AV Area (VTI):     0.81 cm AV Vmax:           232.40 cm/s AV Vmean:          166.200 cm/s AV VTI:            0.511 m AV Peak Grad:      21.6 mmHg AV Mean Grad:      12.6 mmHg LVOT Vmax:         66.10 cm/s LVOT Vmean:        44.700 cm/s LVOT VTI:          0.146 m LVOT/AV VTI ratio: 0.29  AORTA Ao Root diam: 2.30 cm Ao Asc diam:  3.30 cm MITRAL VALVE MV Area (PHT): 2.91 cm     SHUNTS MV Area VTI:   0.70 cm     Systemic VTI:  0.15 m MV Peak grad:  6.5 mmHg     Systemic Diam: 1.90 cm MV Mean grad:  3.0 mmHg MV Vmax:       1.27 m/s MV Vmean:      78.8 cm/s MV Decel Time: 261 msec MV E velocity: 101.00 cm/s MV A velocity: 123.00 cm/s MV E/A ratio:  0.82 Kardie Tobb DO Electronically signed by Thomasene Ripple DO Signature Date/Time: 06/06/2023/3:55:36 PM    Final      PHYSICAL EXAM  Temp:  [97.5 F (36.4 C)-97.7 F (36.5 C)] 97.6 F (36.4 C) (01/22 2000) Pulse Rate:  [65-78] 78 (01/22 2000) Resp:  [14-27] 22 (01/22 2000) BP: (82-114)/(51-96) 96/73 (01/22 2000) SpO2:  [92 %-100 %] 98 % (01/22 2000)  General - Well nourished, well developed, not in distress  Ophthalmologic - fundi not visualized due to noncooperation.  Cardiovascular - Regular rate and rhythm.  Neuro - awake, alert, eyes open, orientated to age, place, time and people. No aphasia, fluent language, following all simple commands. Able to name and repeat and read. No gaze palsy, tracking bilaterally, visual field full, PERRL. No facial droop. Tongue midline. Bilateral UEs 5/5, no drift. Bilaterally LEs 5/5, no drift. Sensation symmetrical bilaterally, b/l FTN intact, gait not tested.     ASSESSMENT/PLAN Ms. [name] is a 63 y.o. 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 No TNK given due to outside window.    Stroke:  left MCA patchy infarct with left M2 thrombus, embolic likely  secondary to cardioembolic source CT left M2 hyperdense sign. Old left prietal and occipital encephalomalacia CTA head and neck left M2 thorombus MRI  left MCA patchy infarcts, old left occipital and b/l cerebellar infarcts CTP 49/53 2D Echo  EF 35-40%, stable TEE No clear source of embolism seen. Bubble study was not done.  LDL 40 HgbA1c 5.9 UDS neg SCDs for VTE prophylaxis Eliquis (apixaban) daily and ASA 81 prior to  admission, now on Pradaxa and aspirin 81. 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 Bubble study was not done.  Now on Pradaxa and aspirin 81  Hypotension BP on the low end Orthostatic vital consistent with hypotension without positional change Off coreg Put on midodrine 5mg  -> 10mg  tid Long term BP goal normotensive  Hyperlipidemia Home meds:  crestor 20  LDL 40, goal < 70 Now on crestor 20 Continue statin at discharge  Other Stroke Risk Factors Obesity, Body mass index is 38.64 kg/m.  Hx stroke on imaging - old left occipital and b/l cerebellar infarcts on MRI  Other Active Problems Cognitive impairment - vascular dementia CKD 3a, Cre 1.70->1.54->1.31-> 1.38->1.52->1.08 Flu infection - PCR + but asymptomatic GIB Thyroid cancer  Hospital day # 5   Marvel Plan, MD PhD Stroke Neurology 06/14/2023 8:19 PM    To contact Stroke Continuity provider, please refer to [email or web address]. After hours, contact General Neurology

## 2023-06-14 NOTE — Progress Notes (Signed)
Cardiology Progress Note  Patient ID: [name] MRN: [id number] DOB: 1961-04-24 Date of Encounter: 06/14/2023 Primary Cardiologist: None  Subjective   Chief Complaint: None.   HPI: Reports no chest pain or trouble breathing.  Blood pressure still a bit low.  ROS:  All other ROS reviewed and negative. Pertinent positives noted in the HPI.     Telemetry  Overnight telemetry shows sinus rhythm 70s, which I personally reviewed.    Physical Exam   Vitals:   06/14/23 0500 06/14/23 0600 06/14/23 0730 06/14/23 0800  BP: 100/75 93/73 99/70    Pulse: 69 69 73   Resp: 20 14 17    Temp:    (!) 97.5 F (36.4 C)  TempSrc:    Oral  SpO2: 92% 100% 100%   Weight:      Height:        Intake/Output Summary (Last 24 hours) at 06/14/2023 1009 Last data filed at 06/13/2023 1900 Gross per 24 hour  Intake 1261.46 ml  Output --  Net 1261.46 ml       06/09/2023   11:43 AM 06/09/2023   10:00 AM 05/04/2023    1:23 PM  Last 3 Weights  Weight (lbs) 178 lb 9.2 oz 178 lb 9.2 oz 181 lb 3.2 oz  Weight (kg) 81 kg 81 kg 82.192 kg    Body mass index is 38.64 kg/m.  General: Well nourished, well developed, in no acute distress Head: Atraumatic, normal size  Eyes: PEERLA, EOMI  Neck: Supple, no JVD Endocrine: No thryomegaly Cardiac: Normal S1, S2; RRR; no murmurs, rubs, or gallops Lungs: Clear to auscultation bilaterally, no wheezing, rhonchi or rales  Abd: Soft, nontender, no hepatomegaly  Ext: No edema, pulses 2+ Musculoskeletal: No deformities, BUE and BLE strength normal and equal Skin: Warm and dry, no rashes   Neuro: Alert and oriented to person, place, time, and situation, CNII-XII grossly intact, no focal deficits  Psych: Normal mood and affect   Cardiac Studies  TEE 06/12/2023  1. Left ventricular ejection fraction, by estimation, is 35 to 40%. The  left ventricle has moderately decreased function.   2. Right ventricular systolic function is mildly reduced. The right   ventricular size is normal.   3. No left atrial/left atrial appendage thrombus was detected.   4. The mitral valve has been repaired/replaced. Trivial mitral valve  regurgitation. The mean mitral valve gradient is 6.0 mmHg with average  heart rate of 80 bpm. There is a 26 mm Sorin Memo prosthetic annuloplasty  ring present in the mitral position.  Procedure Date: 12/23/08.   5. The aortic valve has been repaired/replaced. Aortic valve  regurgitation is trivial. There is a 23 mm Sapien prosthetic (TAVR) valve  present in the aortic position. Procedure Date: 11/21/17. Aortic valve mean  gradient measures 9.0 mmHg.   Patient Profile  [name] is a 63 y.o. 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  She underwent transesophageal echo on 06/12/2023 and suffered acute hypoxic respiratory failure secondary to reactive airway disease.  She is now extubated and doing well.   Assessment & Plan   # L MCA Acute stroke # Small vessel cerebrovascular disease -admitted with acute L MCA stroke.  Known history of leaflet thrombosis of TAVR valve.  Has been on aspirin and Eliquis.  Readmitted for stroke presumptively due to cardioembolic source.  Transesophageal echo really unrevealing.  Discussed her case with neurology.  We have plan to transition to Pradaxa.  We will pursue anticoagulation with an alternate mechanism of action.  I think this is reasonable.  I cannot fully exclude small vessel cerebrovascular disease but neurology has cleared concerns for cardioembolic source.  # Chronic systolic heart failure, EF 35-40% -Nonischemic etiology.  Still recovering from intubation and stroke.  Would hold GDMT today.  She needs time to recover from this.  No signs of volume overload and no need for Lasix.  Cardiology to follow along to reinitiate GDMT.  # Acute hypoxic respiratory  failure # Influenza -Was intubated during her transesophageal echo.  Likely of bronchospasm.  Recovering from this.  She is extubated and doing well.  # Status post TAVR # MV repair -no thrombus or source of embolism identified. Pradaxa as above.   # CKD IIIa # AKI -stable.       For questions or updates, please contact [facility] Please consult [email or web address] for contact info under        Signed, Gerri Spore T. Flora Lipps, MD, [facility]  [facility]  06/14/2023 10:09 AM

## 2023-06-14 NOTE — Progress Notes (Signed)
Daily Progress Note Intern Pager: [phone]  Patient name: [name] Medical record number: [id number] Date of birth: June 23, 1960 Age: 63 y.o. Gender: female  Primary Care Provider: Pcp, No Consultants: Neurology, Cardiology  Code Status: Full Code   Pt Overview and Major Events to Date:  1/17: Admitted 1/20: TEE  Intubated > ICU 1/21: Extubated 1/22: FMTS  Assessment and Plan: [name] is a 63 y.o. 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 TEE per cardiology today to further assess this as the likely cause of embolic stroke.  Assessment & Plan Stroke [facility]) Acute infarcts affecting the posterior left insula. Strength improved 5/5 bilateral LE/UE. LDL 40. A1C 5.9. TSH WNL. Echo on 1/14 unchanged from prior. SLP eval states no restrictions. TEE on 1/20 was abbreviated due to patient becoming hypoxic likely secondary to bronchospasm/laryngospasm and requiring intubation and short ICU admission. No clear source of embolism seen on TEE, otherwise LVEF 35-40%, trivial MVR and AVR.  - Neurology consulted, appreciate recommendations - Cardiology consulted, appreciate recommendations - ASA 81 mg  - Soft pressures at this time, will start Midodrine 5 mg TID with meals per neurology - Pradaxa 150 mg BID per cardiology instead of Eliquis - Cardiac monitoring - Neuro checks q4h - Fall precautions - Delirium precautions - PT/OT Chronic systolic heart failure (HCC) Euvolemic on exam. Caution restarting GDMT in setting of recent CVA and soft blood pressures. Holding Entresto, Carvedilol, and spironolactone. - Cardiology consulted, appreciate  recommendations - Restart GDMT cautiously; restarted Farxiga Hypophosphatemia Phos 2.2. Repleted with Na-Phos 15 mmol - Monitor with AM Phos  Chronic and Stable Problems:  CKD: Cr stable T2DM: On Ozempic weekly.  Held inpatient. Check glucose on AM labs. S/p TAVR and MVR: On Eliquis 5 mg BID; on hold due to size of stroke per neurology HLD: Crestor 20 mg daily Hypothyroidism s/p thyroidectomy d/t papillary thyroid cancer: Restarted home synthroid 1/18   FEN/GI: Clear Liquid Diet PPx: Pradaxa Dispo: Home with home health OT/PT  Subjective:  Patient is doing well this morning and has no concerns. She reports she has a decreased appetite as of late and feels dry, but has no nausea, vomiting, or diarrhea. Feeling good after extubation yesterday.  Objective: Temp:  [97.5 F (36.4 C)-98.6 F (37 C)] 97.5 F (36.4 C) (01/22 1200) Pulse Rate:  [65-80] 75 (01/22 1130) Resp:  [12-25] 22 (01/22 1200) BP: (63-114)/(28-96) 90/76 (01/22 1200) SpO2:  [92 %-100 %] 100 % (01/22 1130) Physical Exam: General: Awake and alert in NAD. Cardiovascular: RRR. No M/R/G. Respiratory: CTAB. Normal WOB on RA. No wheezing, crackles, rhonchi, or diminished breath sounds. Abdomen: Soft, non-tender, non-distended. Normoactive bowel sounds. Extremities: Able to move all extremities. Neuro: CN II: PERRL CN III, IV,VI: EOMI CV V: Normal sensation in V1, V2, V3 CVII: Symmetric smile and brow raise CN VIII: Normal hearing CN IX,X: Symmetric palate raise  CN XI: 5/5 shoulder shrug CN XII: Symmetric tongue protrusion  UE and LE strength 5/5 Normal sensation in UE and LE bilaterally   Laboratory: Most recent CBC Lab Results  Component Value Date   WBC 4.6 06/14/2023   HGB 12.0 06/14/2023   HCT 35.9 (L) 06/14/2023   MCV 86.5 06/14/2023   PLT 114 (L)  06/14/2023   Most recent BMP    Latest Ref Rng & Units 06/14/2023    5:13 AM  BMP  Glucose 70 - 99 mg/dL 92   BUN 8 - 23 mg/dL 17   Creatinine 1.32  - 1.00 mg/dL 4.40   Sodium 102 - 725 mmol/L 134   Potassium 3.5 - 5.1 mmol/L 4.0   Chloride 98 - 111 mmol/L 107   CO2 22 - 32 mmol/L 20   Calcium 8.9 - 10.3 mg/dL 7.9    Mag: 2.6  Imaging/Diagnostic Tests: No new imaging.  Fortunato Curling, DO 06/14/2023, 1:13 PM PGY-1, [facility]  FPTS Intern pager: [phone], text pages welcome Secure chat group [facility] Teaching Service

## 2023-06-14 NOTE — Assessment & Plan Note (Addendum)
Acute infarcts affecting the posterior left insula. Strength improved 5/5 bilateral LE/UE. LDL 40. A1C 5.9. TSH WNL. Echo on 1/14 unchanged from prior. SLP eval states no restrictions. TEE on 1/20 was abbreviated due to patient becoming hypoxic likely secondary to bronchospasm/laryngospasm and requiring intubation and short ICU admission. No clear source of embolism seen on TEE, otherwise LVEF 35-40%, trivial MVR and AVR.  - Neurology consulted, appreciate recommendations - Cardiology consulted, appreciate recommendations - ASA 81 mg  - Soft pressures at this time, will start Midodrine 5 mg TID with meals per neurology - Pradaxa 150 mg BID per cardiology instead of Eliquis - Cardiac monitoring - Neuro checks q4h - Fall precautions - Delirium precautions - PT/OT

## 2023-06-14 NOTE — Assessment & Plan Note (Signed)
Phos 2.2. Repleted with Na-Phos 15 mmol - Monitor with AM Phos

## 2023-06-15 ENCOUNTER — Other Ambulatory Visit (HOSPITAL_COMMUNITY): Payer: Self-pay

## 2023-06-15 DIAGNOSIS — I5022 Chronic systolic (congestive) heart failure: Secondary | ICD-10-CM | POA: Diagnosis not present

## 2023-06-15 DIAGNOSIS — Z952 Presence of prosthetic heart valve: Secondary | ICD-10-CM | POA: Diagnosis not present

## 2023-06-15 DIAGNOSIS — I63412 Cerebral infarction due to embolism of left middle cerebral artery: Secondary | ICD-10-CM | POA: Diagnosis not present

## 2023-06-15 DIAGNOSIS — I639 Cerebral infarction, unspecified: Secondary | ICD-10-CM | POA: Diagnosis not present

## 2023-06-15 DIAGNOSIS — Z9889 Other specified postprocedural states: Secondary | ICD-10-CM | POA: Diagnosis not present

## 2023-06-15 LAB — BASIC METABOLIC PANEL
Anion gap: 9 (ref 5–15)
BUN: 13 mg/dL (ref 8–23)
CO2: 21 mmol/L — ABNORMAL LOW (ref 22–32)
Calcium: 7.8 mg/dL — ABNORMAL LOW (ref 8.9–10.3)
Chloride: 108 mmol/L (ref 98–111)
Creatinine, Ser: 1.22 mg/dL — ABNORMAL HIGH (ref 0.44–1.00)
GFR, Estimated: 50 mL/min — ABNORMAL LOW (ref >60)
Glucose, Bld: 77 mg/dL (ref 70–99)
Potassium: 3.6 mmol/L (ref 3.5–5.1)
Sodium: 138 mmol/L (ref 135–145)

## 2023-06-15 LAB — MAGNESIUM: Magnesium: 2.4 mg/dL (ref 1.7–2.4)

## 2023-06-15 LAB — PHOSPHORUS: Phosphorus: 2.9 mg/dL (ref 2.5–4.6)

## 2023-06-15 MED ORDER — MIDODRINE HCL 5 MG PO TABS
5.0000 mg | ORAL_TABLET | Freq: Once | ORAL | Status: AC
  Administered 2023-06-15: 5 mg via ORAL
  Filled 2023-06-15: qty 1

## 2023-06-15 NOTE — Progress Notes (Signed)
Cardiology Progress Note  Patient ID: [name] MRN: [id number] DOB: 11-07-1960 Date of Encounter: 06/15/2023 Primary Cardiologist: None  Subjective   Chief Complaint: None.   HPI: Hypotension overnight.  Now on midodrine.  Reports no chest pain or trouble breathing.  She seems to be quite stable.  No signs of shock.  ROS:  All other ROS reviewed and negative. Pertinent positives noted in the HPI.     Telemetry  Overnight telemetry shows sinus rhythm 70s, which I personally reviewed.    Physical Exam   Vitals:   06/15/23 0600 06/15/23 0700 06/15/23 0800 06/15/23 0916  BP: 92/69 99/79 118/69   Pulse: 83 76 71   Resp: 13 20 (!) 21   Temp:      TempSrc:      SpO2: 97% 95% 97% 100%  Weight:      Height:        Intake/Output Summary (Last 24 hours) at 06/15/2023 1032 Last data filed at 06/14/2023 2100 Gross per 24 hour  Intake 614.36 ml  Output --  Net 614.36 ml       06/09/2023   11:43 AM 06/09/2023   10:00 AM 05/04/2023    1:23 PM  Last 3 Weights  Weight (lbs) 178 lb 9.2 oz 178 lb 9.2 oz 181 lb 3.2 oz  Weight (kg) 81 kg 81 kg 82.192 kg    Body mass index is 38.64 kg/m.  General: Well nourished, well developed, in no acute distress Head: Atraumatic, normal size  Eyes: PEERLA, EOMI  Neck: Supple, no JVD Endocrine: No thryomegaly Cardiac: Normal S1, S2; RRR; no murmurs, rubs, or gallops Lungs: Clear to auscultation bilaterally, no wheezing, rhonchi or rales  Abd: Soft, nontender, no hepatomegaly  Ext: No edema, pulses 2+ Musculoskeletal: No deformities, BUE and BLE strength normal and equal Skin: Warm and dry, no rashes   Neuro: Alert and oriented to person, place, time, and situation, CNII-XII grossly intact, no focal deficits  Psych: Normal mood and affect   Cardiac Studies  TEE 06/12/2023  1. Left ventricular ejection fraction, by estimation, is 35 to 40%. The  left ventricle has moderately decreased function.   2. Right ventricular systolic function  is mildly reduced. The right  ventricular size is normal.   3. No left atrial/left atrial appendage thrombus was detected.   4. The mitral valve has been repaired/replaced. Trivial mitral valve  regurgitation. The mean mitral valve gradient is 6.0 mmHg with average  heart rate of 80 bpm. There is a 26 mm Sorin Memo prosthetic annuloplasty  ring present in the mitral position.  Procedure Date: 12/23/08.   5. The aortic valve has been repaired/replaced. Aortic valve  regurgitation is trivial. There is a 23 mm Sapien prosthetic (TAVR) valve  present in the aortic position. Procedure Date: 11/21/17. Aortic valve mean  gradient measures 9.0 mmHg.   Patient Profile  [name] is a 63 y.o. 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 She underwent transesophageal echo on 06/12/2023 and suffered acute hypoxic respiratory failure secondary to reactive airway disease. She is now extubated and doing well.   Assessment & Plan   # Left MCA stroke # Small vessel cerebrovascular disease -Admitted with left MCA stroke.  No history of leaflet thrombosis with TAVR valve.  Has been on aspirin and Eliquis.  Transesophageal echo with no thrombus identified on the mitral valve repair or TAVR.  We did switch her to Pradaxa preemptively for a different mechanism of action.  I think this is reasonable.  # Chronic systolic heart failure, EF 35 to 40% -Struggling with hypotension.  No signs of shock.  Now on midodrine.  I think she is just recovering from her extended stay.  She had a course complicated by respiratory failure after transesophageal echo.  Would hold GDMT for now.  No indications for Lasix.  Cardiology to follow along.  # Hypotension -Suspect she is just recovering from being intubated after a transesophageal echo.  No signs of cardiogenic shock.  If this persist we may consider a  right heart catheterization but she seems to be stable at the moment.  Her EF is around 40%.  # Acute hypoxic respiratory failure #Influenza -Respiratory failure after transesophageal echo likely due to bronchospasm.  Now extubated.  Seems to be doing well.  # Status post TAVR # Mitral valve repair -No thrombus or source identified on transesophageal echo.  Transition to Pradaxa as above.  # CKD stage IIIa -Stable.      For questions or updates, please contact [facility] Please consult [email or web address] for contact info under       Signed, Gerri Spore T. Flora Lipps, MD, [facility]  [facility]  06/15/2023 10:32 AM

## 2023-06-15 NOTE — Assessment & Plan Note (Addendum)
Euvolemic on exam. Caution restarting GDMT in setting of recent CVA and soft blood pressures. Holding Entresto, Carvedilol, and spironolactone. - Cardiology consulted, appreciate recommendations - Restart GDMT cautiously; restarted [place]

## 2023-06-15 NOTE — Progress Notes (Signed)
Physical Therapy Treatment Patient Details Name: Deonta Haverty MRN: [id number] DOB: 12/27/1960 Today's Date: 06/15/2023   History of Present Illness 63 y.o. female presenting 06/09/23 with right-sided weakness, fall, slurred speech. 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 Intubated 1/20-1/21 due to resp distress w/ hypoxia during TEE. 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 See vitals flowsheet for BPs. Patient ambulating well with guarding due to ?BP while walking. Anticipate she will not need any PT on discharge from hospital.     If plan is discharge home, recommend the following: Assistance with cooking/housework;Direct supervision/assist for medications management;Direct supervision/assist for financial management;Supervision due to cognitive status   Can travel by private vehicle        Equipment Recommendations  None recommended by PT    Recommendations for Other Services       Precautions / Restrictions Precautions Precautions: Fall;Other (comment) Precaution Comments: fell when had CVA; landed on her rt side, +orthostatics Restrictions Weight Bearing Restrictions Per Provider Order: No     Mobility  Bed Mobility Overal bed mobility: Independent Bed Mobility: Supine to Sit     Supine to sit: Independent          Transfers Overall transfer level: Needs assistance Equipment used: None Transfers: Sit to/from Stand, Bed to chair/wheelchair/BSC Sit to Stand: Contact guard assist           General transfer comment: guarding due to drop in  BP    Ambulation/Gait Ambulation/Gait assistance: Supervision Gait Distance (Feet): 350 Feet Assistive device: None Gait Pattern/deviations: WFL(Within Functional Limits)       General Gait Details: no drift or imbalance or dizziness   Stairs             Wheelchair Mobility     Tilt Bed    Modified Rankin (Stroke Patients Only) Modified Rankin (Stroke Patients Only) Pre-Morbid Rankin Score: No symptoms Modified Rankin: Moderately severe disability     Balance Overall balance assessment: No apparent balance deficits (not formally assessed)                                          Cognition Arousal: Alert Behavior During Therapy: WFL for tasks assessed/performed Overall Cognitive Status: Impaired/Different from baseline Area of Impairment: Problem solving, Memory, Awareness                     Memory: Decreased short-term memory     Awareness: Emergent Problem Solving: Difficulty sequencing, Requires verbal cues General Comments: able to recall 1/3 words, able to do simple math equation, incr time to follow mulitstep commands; difficulty donning mask using RUE, didn't think to use LUE to help        Exercises      General Comments General comments (skin integrity, edema, etc.): see vitals flowsheet for orthostatic BPs      Pertinent Vitals/Pain Pain Assessment Pain Assessment: No/denies pain Faces Pain Scale: No hurt    Home Living Family/patient expects to be discharged to:: Private residence Living Arrangements: Children (daughter) Available  Help at Discharge: Family;Available 24 hours/day Type of Home: Apartment Home Access: Level entry       Home Layout: One level Home Equipment: None      Prior Function            PT Goals (current goals can now be found in the care plan section) Acute Rehab PT Goals Patient Stated Goal: home PT Goal Formulation: With patient Time For Goal Achievement:  06/28/23 Potential to Achieve Goals: Good Progress towards PT goals: Progressing toward goals    Frequency    Min 1X/week      PT Plan      Co-evaluation              AM-PAC PT "6 Clicks" Mobility   Outcome Measure  Help needed turning from your back to your side while in a flat bed without using bedrails?: None Help needed moving from lying on your back to sitting on the side of a flat bed without using bedrails?: None Help needed moving to and from a bed to a chair (including a wheelchair)?: A Little Help needed standing up from a chair using your arms (e.g., wheelchair or bedside chair)?: A Little Help needed to walk in hospital room?: A Little Help needed climbing 3-5 steps with a railing? : A Little 6 Click Score: 20    End of Session Equipment Utilized During Treatment: Gait belt Activity Tolerance: Patient tolerated treatment well Patient left: with call bell/phone within reach;in bed;with bed alarm set;with family/visitor present Nurse Communication: Mobility status;Other (comment) (orthostatic BPs) PT Visit Diagnosis: History of falling (Z91.81);Difficulty in walking, not elsewhere classified (R26.2)     Time: 9629-5284 PT Time Calculation (min) (ACUTE ONLY): 17 min  Charges:    $Gait Training: 8-22 mins PT General Charges $$ ACUTE PT VISIT: 1 Visit                      Jerolyn Center, PT Acute Rehabilitation Services  Office [phone]    Zena Amos 06/15/2023, 5:00 PM

## 2023-06-15 NOTE — Progress Notes (Signed)
Daily Progress Note Intern Pager: [phone]  Patient name: [name] Medical record number: [id number] Date of birth: 1961-02-09 Age: 63 y.o. Gender: female  Primary Care Provider: Pcp, No Consultants: Neurology, Cardiology, CCM Code Status: Full Code  Pt Overview and Major Events to Date:  1/17: Admitted 1/20: TEE  Intubated > ICU 1/21: Extubated 1/22: FMTS  Assessment and Plan: [name] is a 63 y.o. 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 TEE per cardiology today to further assess this as the likely cause of embolic stroke. Been hypotensive since being intubated, likely just recovering, now on Midodrine.  Assessment & Plan Stroke [facility]) Acute infarcts affecting the posterior left insula. Strength improved 5/5 bilateral LE/UE. LDL 40. A1C 5.9. TSH WNL. Echo on 1/14 unchanged from prior. SLP eval states no restrictions. TEE on 1/20 was abbreviated due to patient becoming hypoxic likely secondary to bronchospasm/laryngospasm and requiring intubation and short ICU admission. No clear source of embolism seen on TEE, otherwise LVEF 35-40%, trivial MVR and AVR. DVT US negative. - Neurology consulted, appreciate recommendations - Cardiology consulted, appreciate recommendations - ASA 81 mg  - Soft pressures at this time, will start Midodrine 10 mg TID with meals per neurology - Pradaxa 150 mg BID per cardiology instead of Eliquis - Cardiac monitoring - Neuro checks q4h - Fall precautions - Delirium precautions - PT/OT Chronic systolic heart failure (HCC) Euvolemic on exam. Caution restarting GDMT in setting of recent CVA and soft blood pressures.  Holding Entresto, Carvedilol, and spironolactone. - Cardiology consulted, appreciate recommendations - Restart GDMT cautiously; restarted Farxiga Hypophosphatemia Phos 2.9. - Monitor with AM Phos  Chronic and Stable Problems:  CKD: Cr stable T2DM: On Ozempic weekly.  Held inpatient. Check glucose on AM labs. S/p TAVR and MVR: On Eliquis 5 mg BID; on hold due to size of stroke per neurology HLD: Crestor 20 mg daily Hypothyroidism s/p thyroidectomy d/t papillary thyroid cancer: Restarted home synthroid 1/18   FEN/GI: Clear Liquid Diet PPx: Pradaxa Dispo: Home with home health OT/PT  Subjective:  Patient is doing well today and has no concerns. She feels stronger.  Objective: Temp:  [97.5 F (36.4 C)-98.4 F (36.9 C)] 98 F (36.7 C) (01/23 1200) Pulse Rate:  [65-83] 71 (01/23 1300) Resp:  [12-30] 21 (01/23 1300) BP: (83-130)/(53-88) 104/86 (01/23 1300) SpO2:  [93 %-100 %] 98 % (01/23 1300) Physical Exam: General: Awake and alert in NAD. Cardiovascular: RRR. No M/R/G. Respiratory: CTAB. Normal WOB on RA. No wheezing, crackles, rhonchi, or diminished breath sounds. Abdomen: Soft, non-tender, non-distended. Normoactive bowel sounds. Extremities: Able to move all extremities. Neuro: CN II: PERRL CN III, IV,VI: EOMI CV V: Normal sensation in V1, V2, V3 CVII: Symmetric smile and brow raise CN VIII: Normal hearing CN IX,X: Symmetric palate raise  CN XI: 5/5 shoulder shrug CN XII: Symmetric tongue protrusion  UE and LE strength 4/5 Normal sensation in UE and LE bilaterally   Laboratory: Most recent CBC Lab Results  Component Value Date   WBC 4.6 06/14/2023   HGB 12.0 06/14/2023   HCT 35.9 (L) 06/14/2023   MCV 86.5 06/14/2023   PLT 114 (L) 06/14/2023   Most recent BMP    Latest Ref Rng & Units 06/15/2023  5:36 AM  BMP  Glucose 70 - 99 mg/dL 77   BUN 8 - 23 mg/dL 13   Creatinine 8.29 - 1.00 mg/dL 5.62   Sodium 130 - 865 mmol/L 138   Potassium 3.5 - 5.1 mmol/L  3.6   Chloride 98 - 111 mmol/L 108   CO2 22 - 32 mmol/L 21   Calcium 8.9 - 10.3 mg/dL 7.8    Imaging/Diagnostic Tests: DVT US negative.  Fortunato Curling, DO 06/15/2023, 1:23 PM PGY-1, [facility]  FPTS Intern pager: [phone], text pages welcome Secure chat group [facility] Teaching Service

## 2023-06-15 NOTE — Assessment & Plan Note (Addendum)
Acute infarcts affecting the posterior left insula. Strength improved 5/5 bilateral LE/UE. LDL 40. A1C 5.9. TSH WNL. Echo on 1/14 unchanged from prior. SLP eval states no restrictions. TEE on 1/20 was abbreviated due to patient becoming hypoxic likely secondary to bronchospasm/laryngospasm and requiring intubation and short ICU admission. No clear source of embolism seen on TEE, otherwise LVEF 35-40%, trivial MVR and AVR. DVT US negative. - Neurology consulted, appreciate recommendations - Cardiology consulted, appreciate recommendations - ASA 81 mg  - Soft pressures at this time, will start Midodrine 10 mg TID with meals per neurology - Pradaxa 150 mg BID per cardiology instead of Eliquis - Cardiac monitoring - Neuro checks q4h - Fall precautions - Delirium precautions - PT/OT

## 2023-06-15 NOTE — Evaluation (Signed)
Occupational Therapy Re-Evaluation Patient Details Name: [name] MRN: [id number] DOB: 23-Feb-1961 Today's Date: 06/15/2023   History of Present Illness 63 y.o. female presenting 06/09/23 with right-sided weakness, fall, slurred speech. 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 Intubated 1/20-1/21 due to resp distress w/ hypoxia during TEE. 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 Pt currently with functional limitations due to the deficits listed below (see OT Problem List). Pt is demonstrating improvement compared to initial evaluation. Goals have been updated as needed.  Pt will benefit from acute skilled OT to increase their safety and independence with ADL and functional mobility for ADL to facilitate discharge. Updating follow up recommendation to outpatient OT to focus on mentioned deficits.         If plan is discharge home, recommend the following: A little help with walking and/or transfers;A little help with bathing/dressing/bathroom;Assistance with cooking/housework;Assist for transportation;Help with stairs or ramp for entrance    Functional Status Assessment  Patient has had a recent decline in their functional status and demonstrates the ability to make significant improvements in function in a reasonable and predictable amount of time.  Equipment Recommendations  None recommended by OT       Precautions / Restrictions Precautions Precautions: Fall;Other (comment) Precaution Comments: fell when had CVA; landed on her rt side,  +orthostatics Restrictions Weight Bearing Restrictions Per Provider Order: No      Mobility     Transfers Overall transfer level: Needs assistance Equipment used: None Transfers: Sit to/from Stand Sit to Stand: Contact guard assist     Balance Overall balance assessment: No apparent balance deficits (not formally assessed)                                         ADL either performed or assessed with clinical judgement   ADL Overall ADL's : Needs assistance/impaired Eating/Feeding: Set up;Sitting   Grooming: Set up;Sitting   Upper Body Bathing: Supervision/ safety;Sitting   Lower Body Bathing: Contact guard assist;Sit to/from stand   Upper Body Dressing : Supervision/safety;Sitting   Lower Body Dressing: Contact guard assist;Sit to/from stand   Toilet Transfer: Supervision/safety;Ambulation   Toileting- Clothing Manipulation and Hygiene: Supervision/safety               Vision Baseline Vision/History: 1 Wears glasses Ability to See in Adequate Light: 0 Adequate Patient Visual Report: Blurring of vision Vision Assessment?: Yes Ocular Range of Motion: Within Functional Limits Alignment/Gaze Preference: Within Defined Limits Tracking/Visual Pursuits: Able to track stimulus in all quads without difficulty Saccades: Within functional limits Convergence: Within functional limits Visual Fields: No apparent deficits     Perception Perception: Not tested       Praxis Praxis: Not tested       Pertinent Vitals/Pain Pain Assessment Pain Assessment: No/denies pain Faces Pain Scale: No hurt     Extremity/Trunk Assessment Upper Extremity Assessment Upper Extremity Assessment: Right hand dominant RUE Deficits / Details: 4/5 strength shoulder, elbow ranges. impaired gross grasp. RUE Coordination: decreased fine motor   Lower  Extremity Assessment Lower Extremity Assessment: Defer to PT evaluation   Cervical / Trunk Assessment Cervical /  Trunk Assessment: Other exceptions Cervical / Trunk Exceptions: body habitus   Communication Communication Communication: No apparent difficulties   Cognition Arousal: Alert Behavior During Therapy: WFL for tasks assessed/performed Overall Cognitive Status: Impaired/Different from baseline Area of Impairment: Problem solving, Memory, Awareness                     Memory: Decreased short-term memory       Problem Solving: Difficulty sequencing, Requires verbal cues       General Comments  VSS during today's session    Exercises Other Exercises Other Exercises: Provided with green resistive foam block  in order to increase RUE strength. Provided pt education on use and technique.   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, ind with ADL        OT Problem List: Decreased strength;Decreased range of motion;Decreased activity tolerance;Impaired balance (sitting and/or standing);Impaired vision/perception;Decreased safety awareness;Decreased cognition;Decreased coordination      OT Treatment/Interventions: Self-care/ADL training;Therapeutic exercise;Energy conservation;DME and/or AE instruction;Therapeutic activities;Patient/family education;Balance training;Cognitive remediation/compensation;Visual/perceptual remediation/compensation;Neuromuscular education    OT Goals(Current goals can be found in the care plan section) Acute Rehab OT Goals Patient Stated  Goal: none stated OT Goal Formulation: With patient Time For Goal Achievement: 06/29/23 Potential to Achieve Goals: Good  OT Frequency: Min 1X/week       AM-PAC OT "6 Clicks" Daily Activity     Outcome Measure Help from another person eating meals?: None Help from another person taking care of personal grooming?: None Help from another person toileting, which includes using toliet, bedpan, or urinal?: A Little Help from another person bathing (including washing, rinsing, drying)?: A Little Help from another person to put on and taking off regular upper body clothing?: None Help from another person to put on and taking off regular lower body clothing?: A Little 6 Click Score: 21   End of Session Equipment Utilized During Treatment: Gait belt  Activity Tolerance: Patient tolerated treatment well Patient left: in chair;with call bell/phone within reach;with chair alarm set;with family/visitor present  OT Visit Diagnosis: Unsteadiness on feet (R26.81);Other abnormalities of gait and mobility (R26.89);Muscle weakness (generalized) (M62.81);History of falling (Z91.81);Other symptoms and signs involving the nervous system (Y40.347)                Time: 4259-5638 OT Time Calculation (min): 19 min Charges:  OT General Charges $OT Visit: 1 Visit OT Evaluation $OT Re-eval: 1 Re-eval  Limmie Patricia, OTR/L,CBIS  Supplemental OT - [facility] and [facility] Secure Chat Preferred    Pearlina Friedly, Charisse March 06/15/2023, 9:01 PM

## 2023-06-15 NOTE — Progress Notes (Signed)
Neuro MD paged about patient's SBP dipping down in to the 80s, earlier in the night, Dr. Roda Shutters increased patient's midodrine dose from 5mg  to 10 mg 3 times with meals. Patient took last dose, 5 mg, at 1700 with dinner. This RN asked for another 1 time dose of midodrine 5mg  to hold her through the night until am dose of 10 mg with breakfast. New order placed

## 2023-06-15 NOTE — Plan of Care (Signed)
°  Problem: Self-Care: Goal: Ability to participate in self-care as condition permits will improve Outcome: Progressing Goal: Ability to communicate needs accurately will improve Outcome: Progressing   

## 2023-06-15 NOTE — Plan of Care (Signed)

## 2023-06-15 NOTE — Progress Notes (Signed)
STROKE TEAM PROGRESS NOTE   SUBJECTIVE (INTERVAL HISTORY) No family at bedside.  Patient reclining in chair, still has low BP 90s.  She worked with PT and OT asymptomatic on positional changes but still has lower BP on standing.  Put on midodrine for better BP   OBJECTIVE Temp:  [97.5 F (36.4 C)-98.4 F (36.9 C)] 98.4 F (36.9 C) (01/23 0400) Pulse Rate:  [65-83] 65 (01/23 1200) Cardiac Rhythm: Normal sinus rhythm (01/23 0800) Resp:  [12-30] 30 (01/23 1200) BP: (82-130)/(53-88) 98/73 (01/23 1200) SpO2:  [93 %-100 %] 95 % (01/23 1200)  Recent Labs  Lab 06/09/23 1054 06/12/23 0840  GLUCAP 96 90   Recent Labs  Lab 06/12/23 0453 06/12/23 1358 06/12/23 1525 06/12/23 1840 06/13/23 0520 06/14/23 0513 06/15/23 0536  NA 138   < > 140 138 140 134* 138  K 4.0   < > 4.1 5.1 4.6 4.0 3.6  CL 106  --   --  108 112* 107 108  CO2 22  --   --  20* 16* 20* 21*  GLUCOSE 83  --   --  145* 124* 92 77  BUN 11  --   --  11 16 17 13   CREATININE 1.38*  --   --  1.35* 1.52* 1.08* 1.22*  CALCIUM 8.8*  --   --  8.4* 8.2* 7.9* 7.8*  MG 2.6*  --   --  2.6* 2.6* 2.6* 2.4  PHOS  --   --   --  3.2  --  2.2* 2.9   < > = values in this interval not displayed.   Recent Labs  Lab 06/09/23 1053 06/12/23 1840  AST 21 26  ALT 15 16  ALKPHOS 70 76  BILITOT 0.7 1.1  PROT 6.3* 7.3  ALBUMIN 3.2* 3.8   Recent Labs  Lab 06/09/23 1053 06/09/23 1154 06/12/23 1358 06/12/23 1525 06/12/23 1840 06/13/23 0520 06/14/23 0513  WBC 5.2  --   --   --  10.3 6.3 4.6  NEUTROABS 3.2  --   --   --   --   --   --   HGB 13.6   < > 15.0 14.3 14.5 14.6 12.0  HCT 42.5   < > 44.0 42.0 44.9 45.8 35.9*  MCV 90.0  --   --   --  88.2 89.1 86.5  PLT 83*  --   --   --  141* 115* 114*   < > = values in this interval not displayed.   No results for input(s): "CKTOTAL", "CKMB", "CKMBINDEX", "TROPONINI" in the last 168 hours. No results for input(s): "LABPROT", "INR" in the last 72 hours.  No results for input(s):  "COLORURINE", "LABSPEC", "PHURINE", "GLUCOSEU", "HGBUR", "BILIRUBINUR", "KETONESUR", "PROTEINUR", "UROBILINOGEN", "NITRITE", "LEUKOCYTESUR" in the last 72 hours.  Invalid input(s): "APPERANCEUR"     Component Value Date/Time   CHOL 85 06/10/2023 0603   TRIG 96 06/10/2023 0603   HDL 26 (L) 06/10/2023 0603   CHOLHDL 3.3 06/10/2023 0603   VLDL 19 06/10/2023 0603   LDLCALC 40 06/10/2023 0603   Lab Results  Component Value Date   HGBA1C 5.9 (H) 06/10/2023      Component Value Date/Time   LABOPIA NONE DETECTED 06/10/2023 1210   COCAINSCRNUR NONE DETECTED 06/10/2023 1210   LABBENZ NONE DETECTED 06/10/2023 1210   AMPHETMU NONE DETECTED 06/10/2023 1210   THCU NONE DETECTED 06/10/2023 1210   LABBARB NONE DETECTED 06/10/2023 1210    Recent Labs  Lab 06/09/23 1130  ETH <10    I have personally reviewed the radiological images below and agree with the radiology interpretations.  VAS [place] LOWER EXTREMITY VENOUS (DVT) Result Date: 06/14/2023  Lower Venous DVT Study Patient Name:  [name]  Date of Exam:   06/13/2023 Medical Rec #: [id number]         Accession #:    [id number] Date of Birth: 08-01-1960         Patient Gender: F Patient Age:   63 years Exam Location:  [facility] Procedure:      VAS [place] LOWER EXTREMITY VENOUS (DVT) Referring Phys: Levon Hedger --------------------------------------------------------------------------------  Indications: Edema, and stroke.  Anticoagulation: Eliquis. Comparison Study: No priors. Performing Technologist: Marilynne Halsted RDMS, RVT  Examination Guidelines: A complete evaluation includes B-mode imaging, spectral Doppler, color Doppler, and power Doppler as needed of all accessible portions of each vessel. Bilateral testing is considered an integral part of a complete examination. Limited examinations for reoccurring indications may be performed as noted. The reflux portion of the exam is performed with the patient in reverse Trendelenburg.   +---------+---------------+---------+-----------+----------+--------------+ RIGHT    CompressibilityPhasicitySpontaneityPropertiesThrombus Aging +---------+---------------+---------+-----------+----------+--------------+ CFV      Full           Yes      Yes                                 +---------+---------------+---------+-----------+----------+--------------+ SFJ      Full                                                        +---------+---------------+---------+-----------+----------+--------------+ FV Prox  Full                                                        +---------+---------------+---------+-----------+----------+--------------+ FV Mid   Full                                                        +---------+---------------+---------+-----------+----------+--------------+ FV DistalFull                                                        +---------+---------------+---------+-----------+----------+--------------+ PFV      Full                                                        +---------+---------------+---------+-----------+----------+--------------+ POP      Full           Yes      Yes                                 +---------+---------------+---------+-----------+----------+--------------+  PTV      Full                                                        +---------+---------------+---------+-----------+----------+--------------+ PERO     Full                                                        +---------+---------------+---------+-----------+----------+--------------+   +---------+---------------+---------+-----------+----------+--------------+ LEFT     CompressibilityPhasicitySpontaneityPropertiesThrombus Aging +---------+---------------+---------+-----------+----------+--------------+ CFV      Full           Yes      Yes                                  +---------+---------------+---------+-----------+----------+--------------+ SFJ      Full                                                        +---------+---------------+---------+-----------+----------+--------------+ FV Prox  Full                                                        +---------+---------------+---------+-----------+----------+--------------+ FV Mid   Full                                                        +---------+---------------+---------+-----------+----------+--------------+ FV DistalFull                                                        +---------+---------------+---------+-----------+----------+--------------+ PFV      Full                                                        +---------+---------------+---------+-----------+----------+--------------+ POP      Full           Yes      Yes                                 +---------+---------------+---------+-----------+----------+--------------+ PTV      Full                                                        +---------+---------------+---------+-----------+----------+--------------+  PERO     Full                                                        +---------+---------------+---------+-----------+----------+--------------+     Summary: BILATERAL: - No evidence of deep vein thrombosis seen in the lower extremities, bilaterally. -No evidence of popliteal cyst, bilaterally.   *See table(s) above for measurements and observations. Electronically signed by Coral Else MD on 06/14/2023 at 6:53:00 PM.    Final    CT HEAD WO CONTRAST ( ) Result Date: 06/14/2023 CLINICAL DATA:  Follow-up examination for stroke. EXAM: CT HEAD WITHOUT CONTRAST TECHNIQUE: Contiguous axial images were obtained from the base of the skull through the vertex without intravenous contrast. RADIATION DOSE REDUCTION: This exam was performed according to the departmental dose-optimization program  which includes automated exposure control, adjustment of the mA and/or kV according to patient size and/or use of iterative reconstruction technique. COMPARISON:  Of prior brain MRI from 06/10/2023 FINDINGS: Brain: Evolving cytotoxic edema involving the posterior left insula and left parieto-occipital region, consistent with evolving left MCA distribution infarct. Overall size and distribution is similar to previous MRI. No hemorrhagic transformation or significant regional mass effect. No other acute intracranial hemorrhage. No new large vessel territory infarct. No mass lesion or midline shift. No hydrocephalus or extra-axial fluid collection. Vascular: No abnormal hyperdense vessel. Skull: Scalp soft tissues within normal limits.  Calvarium intact. Sinuses/Orbits: Globes and orbital soft tissues within normal limits. Mild mucosal thickening about the left frontal sinus. Paranasal sinuses are otherwise clear. No significant mastoid effusion. Other: None. IMPRESSION: 1. 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 As compared to prior no hemorrhagic transformation or significant regional mass effect. 2. No other new acute intracranial abnormality. Electronically Signed   By: Rise Mu M.D.   On: 06/14/2023 04:20   EP STUDY Result Date: 06/12/2023 See surgical note for result.  DG Chest 1 View Result Date: 06/12/2023 CLINICAL DATA:  Shortness of breath EXAM: CHEST  1 VIEW COMPARISON:  11/21/2017 FINDINGS: Low position of endotracheal tube, tip approximately 1 cm above carina directed towards right mainstem bronchus. Low lung volumes with some linear opacities in the right mid and lower lung, and left perihilar and suprahilar airspace opacities. Heart size and mediastinal contours are within normal limits. Post TAVR. Sternotomy wires. Surgical clips at the thoracic inlet. Blunting of left lateral costophrenic angle. IMPRESSION: 1. Low position  of endotracheal tube, tip 1 cm above carina directed towards right mainstem bronchus. Recommend retraction by 2 cm. 2. Low lung volumes with bilateral atelectasis and/or infiltrates. Electronically Signed   By: Corlis Leak M.D.   On: 06/12/2023 15:45   ECHO TEE Result Date: 06/12/2023    TRANSESOPHOGEAL ECHO REPORT   Patient Name:   [name] Date of Exam: 06/12/2023 Medical Rec #:  [id number]        Height:       57.0 in Accession #:    [id number]       Weight:       178.6 lb Date of Birth:  12-29-1960        BSA:          1.714 m Patient Age:    62 years         BP:  104/84 mmHg Patient Gender: F                HR:           84 bpm. Exam Location:  Inpatient Procedure: Transesophageal Echo, Cardiac Doppler, Color Doppler and 3D Echo Indications:     stroke  History:         Patient has prior history of Echocardiogram examinations, most                  recent 06/06/2023. CHF, Pulmonary HTN and chronic kidney                  disease. Rheumatic heart disease.; Risk Factors:Diabetes,                  Former Smoker and Hypertension.                  Aortic Valve: 23 mm Sapien prosthetic, stented (TAVR) valve is                  present in the aortic position. Procedure Date: 11/21/17.                  Mitral Valve: 26 mm Sorin Memo prosthetic annuloplasty ring                  valve is present in the mitral position. Procedure Date:                  12/23/08.  Sonographer:     Delcie Roch RDCS Referring Phys:  [id number] RHONDA G BARRETT Diagnosing Phys: Epifanio Lesches MD PROCEDURE: After discussion of the risks and benefits of a TEE, an informed consent was obtained from the patient. The patient was intubated. The transesophogeal probe was passed without difficulty through the esophogus of the patient. Imaged were obtained with the patient in a supine position. Sedation performed by different physician. The patient was monitored while under deep sedation. The patient developed Respiratory depression  during the procedure.  IMPRESSIONS  1. Left ventricular ejection fraction, by estimation, is 35 to 40%. The left ventricle has moderately decreased function.  2. Right ventricular systolic function is mildly reduced. The right ventricular size is normal.  3. No left atrial/left atrial appendage thrombus was detected.  4. The mitral valve has been repaired/replaced. Trivial mitral valve regurgitation. The mean mitral valve gradient is 6.0 mmHg with average heart rate of 80 bpm. There is a 26 mm Sorin Memo prosthetic annuloplasty ring present in the mitral position. Procedure Date: 12/23/08.  5. The aortic valve has been repaired/replaced. Aortic valve regurgitation is trivial. There is a 23 mm Sapien prosthetic (TAVR) valve present in the aortic position. Procedure Date: 11/21/17. Aortic valve mean gradient measures 9.0 mmHg. Conclusion(s)/Recommendation(s): Abbreviated study as patient developed significant hypoxia and procedure was aborted. Bubble study was not done. No clear source of embolism seen. 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 FINDINGS  Left Ventricle: Left ventricular ejection fraction, by estimation, is 35 to 40%. The left ventricle has moderately decreased function. The left ventricular internal cavity size was normal in size. Right Ventricle: The right ventricular size is normal. Right vetricular wall thickness was not well visualized. Right ventricular systolic function is mildly reduced. Left Atrium: Left atrial size was normal in size. No left atrial/left atrial appendage thrombus was  detected. Right Atrium: Right atrial size was normal in size. Pericardium: There is no evidence of pericardial effusion. Mitral Valve: The mitral valve has been repaired/replaced. Trivial mitral valve regurgitation. There is a 26 mm Sorin Memo prosthetic  annuloplasty ring present in the mitral position. Procedure Date: 12/23/08. MV peak gradient, 10.2 mmHg. The mean mitral valve gradient is 6.0 mmHg with average heart rate of 80 bpm. Tricuspid Valve: The tricuspid valve is normal in structure. Tricuspid valve regurgitation is trivial. Aortic Valve: The aortic valve has been repaired/replaced. Aortic valve regurgitation is trivial. Aortic valve mean gradient measures 9.0 mmHg. Aortic valve peak gradient measures 18.1 mmHg. Aortic valve area, by VTI measures 1.02 cm. There is a 23 mm Sapien prosthetic, stented (TAVR) valve present in the aortic position. Procedure Date: 11/21/17. Pulmonic Valve: The pulmonic valve was not well visualized. Pulmonic valve regurgitation is not visualized. Aorta: The aortic root and ascending aorta are structurally normal, with no evidence of dilitation. IAS/Shunts: No atrial level shunt detected by color flow Doppler.  LEFT VENTRICLE PLAX 2D LVOT diam:     1.80 cm LV SV:         37 LV SV Index:   21 LVOT Area:     2.54 cm  AORTIC VALVE AV Area (Vmax):    0.81 cm AV Area (Vmean):   0.82 cm AV Area (VTI):     1.02 cm AV Vmax:           213.00 cm/s AV Vmean:          135.000 cm/s AV VTI:            0.360 m AV Peak Grad:      18.1 mmHg AV Mean Grad:      9.0 mmHg LVOT Vmax:         68.00 cm/s LVOT Vmean:        43.500 cm/s LVOT VTI:          0.144 m LVOT/AV VTI ratio: 0.40 MITRAL VALVE MV Area VTI:  1.07 cm    SHUNTS MV Peak grad: 10.2 mmHg   Systemic VTI:  0.14 m MV Mean grad: 6.0 mmHg    Systemic Diam: 1.80 cm MV Vmax:      1.60 m/s MV Vmean:     112.0 cm/s Epifanio Lesches MD Electronically signed by Epifanio Lesches MD Signature Date/Time: 06/12/2023/3:04:41 PM    Final    MR BRAIN WO CONTRAST Result Date: 06/10/2023 CLINICAL DATA:  Acute neurologic deficit EXAM: MRI HEAD WITHOUT CONTRAST TECHNIQUE: Multiplanar, multiecho pulse sequences of the brain and surrounding structures were obtained without intravenous contrast.  COMPARISON:  12/10/2007 FINDINGS: Brain: There is a small acute infarcts affecting the posterior left insula and the left temporal lobe. Chronic microhemorrhage in the left frontal lobe. There is multifocal hyperintense T2-weighted signal within the white matter. Parenchymal volume and CSF spaces are normal. 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 Vascular: Normal flow voids. Skull and upper cervical spine: Normal calvarium and skull base. Visualized upper cervical spine and soft tissues are normal. Sinuses/Orbits:No paranasal sinus fluid levels or advanced mucosal thickening. No mastoid or middle ear effusion. Normal orbits. IMPRESSION: 1. Small acute infarcts affecting the posterior left insula and the left temporal lobe. No hemorrhage or mass effect. 2. 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 Electronically Signed   By: Deatra Robinson M.D.   On: 06/10/2023 02:50   CT ANGIO HEAD NECK  W WO CM W PERF (CODE STROKE) Result Date: 06/09/2023 CLINICAL DATA:  Neuro deficit, acute, stroke suspected. Slurred speech. Facial droop. EXAM: CT ANGIOGRAPHY HEAD AND NECK CT PERFUSION BRAIN TECHNIQUE: Multidetector CT imaging of the head and neck was performed using the standard protocol during bolus administration of intravenous contrast. Multiplanar CT image reconstructions and MIPs were obtained to evaluate the vascular anatomy. Carotid stenosis measurements (when applicable) are obtained utilizing NASCET criteria, using the distal internal carotid diameter as the denominator. Multiphase CT imaging of the brain was performed following IV bolus contrast injection. Subsequent parametric perfusion maps were calculated using RAPID software. RADIATION DOSE REDUCTION: This exam was performed according to the departmental dose-optimization program which includes automated exposure control, adjustment of the mA and/or kV according to patient  size and/or use of iterative reconstruction technique. CONTRAST:  OMNIPAQUE IOHEXOL 350 MG/ML SOLN COMPARISON:  None Available. Head CT immediately prior FINDINGS: CTA NECK FINDINGS Aortic arch: Normal appearance with normal branching pattern. Right carotid system: Common carotid artery is tortuous but widely patent to the bifurcation. No bifurcation atherosclerotic disease. Cervical ICA widely patent. Left carotid system: Left common carotid artery widely patent but tortuous. No carotid bifurcation disease. Cervical ICA is normal. Vertebral arteries: Right vertebral artery takes an early origin from the proximal subclavian artery and is then widely patent through the cervical region to the foramen magnum. Left vertebral artery origin is normal and the vessel appears normal through the cervical region. Skeleton: Ordinary cervical spondylosis. Other neck: No mass or lymphadenopathy. Previous thyroidectomy. Upper chest: Mild scarring at the left lung apex. No apparent change since June of 2023. Review of the MIP images confirms the above findings CTA HEAD FINDINGS Anterior circulation: Both internal carotid arteries are widely patent through the skull base and siphon regions. Minimal siphon atherosclerotic calcification but no stenosis. Anterior cerebral arteries are patent and normal. Right middle cerebral artery is normal. Focal embolus within the left inferior division M2 branch with diminished distal perfusion. Posterior circulation: Both vertebral arteries widely patent through the foramen magnum to the basilar artery. Diminutive but widely patent basilar artery. Posterior circulation branch vessels are normal. Dominant anterior circulation supply of the right PCA. Venous sinuses: Patent and normal. Anatomic variants: None other significant. Review of the MIP images confirms the above findings CT Brain Perfusion Findings: ASPECTS: 10 CBF (<30%) Volume: 49mL Perfusion (Tmax>6.0s) volume: 53mL Mismatch  Volume: 4mL Infarction Location:Left parietal lobe. IMPRESSION: 1. Focal embolus within the left inferior division M2 branch with diminished distal perfusion. 2. 49 mL of core infarct in the left parietal lobe. 53 mL of ischemic penumbra. 4 mL of mismatch. 3. No other significant vascular finding. Tortuous common carotid arteries bilaterally. No carotid bifurcation disease. 4. Early origin of the right vertebral artery from the proximal subclavian artery. This is a normal anatomic variant. 5. These results were communicated to Dr. Amada Jupiter at 11:45 am on 06/09/2023 by text page via the [facility] messaging system. Electronically Signed   By: Paulina Fusi M.D.   On: 06/09/2023 11:45   CT HEAD CODE STROKE WO CONTRAST Result Date: 06/09/2023 CLINICAL DATA:  Code stroke. Slurred speech, right-sided weakness, facial droop EXAM: CT HEAD WITHOUT CONTRAST TECHNIQUE: Contiguous axial images were obtained from the base of the skull through the vertex without intravenous contrast. RADIATION DOSE REDUCTION: This exam was performed according to the departmental dose-optimization program which includes automated exposure control, adjustment of the mA and/or kV according to patient size and/or use of iterative reconstruction technique.  COMPARISON:  03/06/2012 CT head FINDINGS: Brain: No evidence of acute infarction, hemorrhage, mass, mass effect, or midline shift. No hydrocephalus or extra-axial collection. Redemonstrated encephalomalacia in the left parietal and occipital lobes. Advanced atrophy for age. Partial empty sella. Normal craniocervical junction. Vascular: Minimal hyperdensity in the left insular M2 (series 3, image 14). No other hyperdense vessel. Atherosclerotic calcifications in the intracranial carotid arteries. Skull: Negative for fracture or focal lesion. Sinuses/Orbits: Mild mucosal thickening in the paranasal sinuses. No acute finding in the orbits. Other: The mastoid air cells are well aerated. ASPECTS [facility]  Stroke Program Early CT Score) - Ganglionic level infarction (caudate, lentiform nuclei, internal capsule, insula, M1-M3 cortex): 7 - Supraganglionic infarction (M4-M6 cortex): 3 Total score (0-10 with 10 being normal): 10 IMPRESSION: 1. No acute intracranial hemorrhage. ASPECTS is 10. 2. Minimal hyperdensity in the left insular M2, which could represent thrombus. 3. Redemonstrated encephalomalacia in the left parietal and occipital lobes. Imaging results were communicated on 06/09/2023 at 11:10 am to provider Dr. Amada Jupiter via secure text paging. Electronically Signed   By: Wiliam Ke M.D.   On: 06/09/2023 11:10   ECHOCARDIOGRAM COMPLETE Result Date: 06/06/2023    ECHOCARDIOGRAM REPORT   Patient Name:   [name] Date of Exam: 06/06/2023 Medical Rec #:  [id number]        Height:       57.0 in Accession #:    [id number]       Weight:       181.2 lb Date of Birth:  Dec 05, 1960        BSA:          1.725 m Patient Age:    62 years         BP:           96/62 mmHg Patient Gender: F                HR:           72 bpm. Exam Location:  Outpatient Procedure: 2D Echo, Intracardiac Opacification Agent, Color Doppler and Cardiac            Doppler Indications:    CHF  History:        Patient has prior history of Echocardiogram examinations, most                 recent 05/25/2022. CHF and Cardiomyopathy, S/P TAVR, Pulmonary                 HTN; Risk Factors:Hypertension.                  Mitral Valve: 26 mm valve is present in the mitral position.                 Procedure Date: 12/2008.  Sonographer:    Webb Laws Referring Phys: [id number] [place] LEE IMPRESSIONS  1. Left ventricular ejection fraction, by estimation, is 35 to 40%. The left ventricle has moderately decreased function. The left ventricle demonstrates regional wall motion abnormalities (see scoring diagram/findings for description). There is mild asymmetric left ventricular hypertrophy of the septal segment. Left ventricular diastolic function could  not be evaluated. Elevated left ventricular end-diastolic pressure.  2. Right ventricular systolic function is normal. The right ventricular size is normal. Tricuspid regurgitation signal is inadequate for assessing PA pressure.  3. The mitral valve has been repaired/replaced. Trivial mitral valve regurgitation. At risk for mild mitral mitral stenosis. There is a 26 mm present in the mitral  position. Procedure Date: 12/2008.  4. The aortic valve is normal in structure. Aortic valve regurgitation is not visualized. No aortic stenosis is present.  5. The inferior vena cava is normal in size with greater than 50% respiratory variability, suggesting right atrial pressure of 3 mmHg. FINDINGS  Left Ventricle: Left ventricular ejection fraction, by estimation, is 35 to 40%. The left ventricle has moderately decreased function. The left ventricle demonstrates regional wall motion abnormalities. The left ventricular internal cavity size was normal in size. There is mild asymmetric left ventricular hypertrophy of the septal segment. Left ventricular diastolic function could not be evaluated due to mitral valve repair. Left ventricular diastolic function could not be evaluated. Elevated left ventricular end-diastolic pressure.  LV Wall Scoring: The entire apex is akinetic. The anterior wall, antero-lateral wall, anterior septum, inferior wall, posterior wall, mid inferoseptal segment, and basal inferoseptal segment are hypokinetic. Right Ventricle: The right ventricular size is normal. No increase in right ventricular wall thickness. Right ventricular systolic function is normal. Tricuspid regurgitation signal is inadequate for assessing PA pressure. Left Atrium: Left atrial size was normal in size. Right Atrium: Right atrial size was normal in size. Pericardium: There is no evidence of pericardial effusion. Presence of epicardial fat layer. Mitral Valve: The mitral valve has been repaired/replaced. Trivial mitral valve  regurgitation. There is a 26 mm present in the mitral position. Procedure Date: 12/2008. At risk for mild mitral mitral valve stenosis. MV peak gradient, 6.5 mmHg. The mean mitral valve gradient is 3.0 mmHg. Tricuspid Valve: The tricuspid valve is normal in structure. Tricuspid valve regurgitation is not demonstrated. No evidence of tricuspid stenosis. Aortic Valve: The aortic valve is normal in structure. Aortic valve regurgitation is not visualized. No aortic stenosis is present. Aortic valve mean gradient measures 12.6 mmHg. Aortic valve peak gradient measures 21.6 mmHg. Aortic valve area, by VTI measures 0.81 cm. Pulmonic Valve: The pulmonic valve was normal in structure. Pulmonic valve regurgitation is not visualized. No evidence of pulmonic stenosis. Aorta: The aortic root is normal in size and structure. Venous: The inferior vena cava is normal in size with greater than 50% respiratory variability, suggesting right atrial pressure of 3 mmHg. IAS/Shunts: No atrial level shunt detected by color flow Doppler.  LEFT VENTRICLE PLAX 2D LVIDd:         5.30 cm      Diastology LVIDs:         4.40 cm      LV e' medial:    4.57 cm/s LV PW:         0.70 cm      LV E/e' medial:  22.1 LV IVS:        1.10 cm      LV e' lateral:   4.46 cm/s LVOT diam:     1.90 cm      LV E/e' lateral: 22.6 LV SV:         41 LV SV Index:   24 LVOT Area:     2.84 cm  LV Volumes (MOD) LV vol d, MOD A2C: 126.0 ml LV vol d, MOD A4C: 153.0 ml LV vol s, MOD A2C: 83.3 ml LV vol s, MOD A4C: 100.0 ml LV SV MOD A2C:     42.7 ml LV SV MOD A4C:     153.0 ml LV SV MOD BP:      50.9 ml RIGHT VENTRICLE RV Basal diam:  2.40 cm RV S prime:     4.90 cm/s LEFT ATRIUM  Index        RIGHT ATRIUM          Index LA diam:        4.90 cm 2.84 cm/m   RA Area:     6.02 cm LA Vol (A2C):   33.5 ml 19.42 ml/m  RA Volume:   7.74 ml  4.49 ml/m LA Vol (A4C):   38.7 ml 22.44 ml/m LA Biplane Vol: 38.3 ml 22.21 ml/m  AORTIC VALVE AV Area (Vmax):    0.81 cm AV  Area (Vmean):   0.76 cm AV Area (VTI):     0.81 cm AV Vmax:           232.40 cm/s AV Vmean:          166.200 cm/s AV VTI:            0.511 m AV Peak Grad:      21.6 mmHg AV Mean Grad:      12.6 mmHg LVOT Vmax:         66.10 cm/s LVOT Vmean:        44.700 cm/s LVOT VTI:          0.146 m LVOT/AV VTI ratio: 0.29  AORTA Ao Root diam: 2.30 cm Ao Asc diam:  3.30 cm MITRAL VALVE MV Area (PHT): 2.91 cm     SHUNTS MV Area VTI:   0.70 cm     Systemic VTI:  0.15 m MV Peak grad:  6.5 mmHg     Systemic Diam: 1.90 cm MV Mean grad:  3.0 mmHg MV Vmax:       1.27 m/s MV Vmean:      78.8 cm/s MV Decel Time: 261 msec MV E velocity: 101.00 cm/s MV A velocity: 123.00 cm/s MV E/A ratio:  0.82 Kardie Tobb DO Electronically signed by Thomasene Ripple DO Signature Date/Time: 06/06/2023/3:55:36 PM    Final      PHYSICAL EXAM  Temp:  [97.5 F (36.4 C)-98.4 F (36.9 C)] 98.4 F (36.9 C) (01/23 0400) Pulse Rate:  [65-83] 65 (01/23 1200) Resp:  [12-30] 30 (01/23 1200) BP: (82-130)/(53-88) 98/73 (01/23 1200) SpO2:  [93 %-100 %] 95 % (01/23 1200)  General - Well nourished, well developed, not in distress  Ophthalmologic - fundi not visualized due to noncooperation.  Cardiovascular - Regular rate and rhythm.  Neuro - awake, alert, eyes open, orientated to age, place, time and people. No aphasia, fluent language, following all simple commands. Able to name and repeat and read. No gaze palsy, tracking bilaterally, visual field full, PERRL. No facial droop. Tongue midline. Bilateral UEs 5/5, no drift. Bilaterally LEs 5/5, no drift. Sensation symmetrical bilaterally, b/l FTN intact, gait not tested.     ASSESSMENT/PLAN Ms. [name] is a 63 y.o. 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 No TNK given due to outside window.    Stroke:  left MCA patchy infarct with left M2 thrombus, embolic likely  secondary to cardioembolic source CT left M2 hyperdense sign. Old left prietal and occipital encephalomalacia CTA head and neck left M2 thorombus MRI  left MCA patchy infarcts, old left occipital and b/l cerebellar infarcts CTP 49/53 2D Echo  EF 35-40%, stable TEE No clear source of embolism seen. Bubble study was not done.  LDL 40 HgbA1c 5.9 UDS neg SCDs for VTE prophylaxis Eliquis (apixaban) daily and ASA 81 prior to  admission, now on Pradaxa and aspirin 81. 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 Eliquis was started." Now cardiology on board On ASA 81 and eliquis PTA On GDMT therapy EF 35-40% TEE No clear source of embolism seen. Bubble study was not done.  Now on Pradaxa and aspirin 81  Hypotension BP on the low end, has improved some Orthostatic vital consistent with hypotension without positional change Off coreg Put on midodrine 5mg  -> 10mg  tid Long term BP goal normotensive  Hyperlipidemia Home meds:  crestor 20  LDL 40, goal < 70 Now on crestor 20 Continue statin at discharge  Other Stroke Risk Factors Obesity, Body mass index is 38.64 kg/m.  Hx stroke on imaging - old left occipital and b/l cerebellar infarcts on MRI  Other Active Problems Cognitive impairment - vascular dementia CKD 3a, Cre 1.70->1.54->1.31-> 1.38->1.52->1.08->1.22 Flu infection - PCR + but asymptomatic GIB Thyroid cancer  Hospital day # 6  Neurology will sign off. Please call with questions. Pt will follow up with stroke clinic NP at [facility]  in about 4 weeks. Thanks for the consult.   Marvel Plan, MD PhD Stroke Neurology 06/15/2023 12:17 PM    To contact Stroke Continuity provider, please refer to [email or web address]. After hours, contact General Neurology

## 2023-06-15 NOTE — Assessment & Plan Note (Addendum)
Phos 2.9. - Monitor with AM Phos

## 2023-06-16 ENCOUNTER — Other Ambulatory Visit (HOSPITAL_COMMUNITY): Payer: Self-pay

## 2023-06-16 DIAGNOSIS — Z952 Presence of prosthetic heart valve: Secondary | ICD-10-CM | POA: Diagnosis not present

## 2023-06-16 DIAGNOSIS — I959 Hypotension, unspecified: Secondary | ICD-10-CM | POA: Diagnosis not present

## 2023-06-16 DIAGNOSIS — I639 Cerebral infarction, unspecified: Secondary | ICD-10-CM | POA: Diagnosis not present

## 2023-06-16 DIAGNOSIS — N1831 Chronic kidney disease, stage 3a: Secondary | ICD-10-CM

## 2023-06-16 DIAGNOSIS — I63412 Cerebral infarction due to embolism of left middle cerebral artery: Secondary | ICD-10-CM | POA: Diagnosis not present

## 2023-06-16 DIAGNOSIS — I5022 Chronic systolic (congestive) heart failure: Secondary | ICD-10-CM | POA: Diagnosis not present

## 2023-06-16 LAB — BASIC METABOLIC PANEL
Anion gap: 9 (ref 5–15)
BUN: 9 mg/dL (ref 8–23)
CO2: 19 mmol/L — ABNORMAL LOW (ref 22–32)
Calcium: 8.2 mg/dL — ABNORMAL LOW (ref 8.9–10.3)
Chloride: 111 mmol/L (ref 98–111)
Creatinine, Ser: 1.13 mg/dL — ABNORMAL HIGH (ref 0.44–1.00)
GFR, Estimated: 55 mL/min — ABNORMAL LOW (ref >60)
Glucose, Bld: 73 mg/dL (ref 70–99)
Potassium: 3.7 mmol/L (ref 3.5–5.1)
Sodium: 139 mmol/L (ref 135–145)

## 2023-06-16 LAB — PHOSPHORUS: Phosphorus: 2.5 mg/dL (ref 2.5–4.6)

## 2023-06-16 MED ORDER — DABIGATRAN ETEXILATE MESYLATE 150 MG PO CAPS
150.0000 mg | ORAL_CAPSULE | Freq: Two times a day (BID) | ORAL | 0 refills | Status: DC
  Filled 2023-06-16: qty 60, 30d supply, fill #0

## 2023-06-16 MED ORDER — SPIRONOLACTONE 12.5 MG HALF TABLET
12.5000 mg | ORAL_TABLET | Freq: Every day | ORAL | Status: DC
  Administered 2023-06-16 – 2023-06-17 (×2): 12.5 mg via ORAL
  Filled 2023-06-16 (×2): qty 1

## 2023-06-16 MED ORDER — METOPROLOL SUCCINATE ER 25 MG PO TB24
25.0000 mg | ORAL_TABLET | Freq: Every day | ORAL | Status: DC
  Administered 2023-06-16 – 2023-06-17 (×2): 25 mg via ORAL
  Filled 2023-06-16 (×2): qty 1

## 2023-06-16 MED ORDER — POTASSIUM CHLORIDE CRYS ER 20 MEQ PO TBCR
40.0000 meq | EXTENDED_RELEASE_TABLET | Freq: Once | ORAL | Status: AC
  Administered 2023-06-16: 40 meq via ORAL
  Filled 2023-06-16: qty 2

## 2023-06-16 MED ORDER — CARVEDILOL 3.125 MG PO TABS: 3.1250 mg | ORAL_TABLET | Freq: Two times a day (BID) | ORAL | Status: DC

## 2023-06-16 MED ORDER — SPIRONOLACTONE 25 MG PO TABS
12.5000 mg | ORAL_TABLET | Freq: Every day | ORAL | 0 refills | Status: DC
  Filled 2023-06-16: qty 15, 30d supply, fill #0

## 2023-06-16 NOTE — TOC Progression Note (Signed)
Transition of Care [facility]) - Progression Note    Patient Details  Name: [name] MRN: [id number] Date of Birth: 01/19/1961  [facility]) CM/SW Contact  Kermit Balo, RN Phone Number: 06/16/2023, 10:31 AM  Clinical Narrative:     Outpatient therapy arranged with [facility]. Referral sent and information on the AVS.  CM as asked [facility] to pick her up in their clinic for PCP. Pt may need assistance with transportation home at d/c.  TOC following.   Expected Discharge Plan: OP Rehab Barriers to Discharge: Continued Medical Work up  Expected Discharge Plan and Services   Discharge Planning Services: CM Consult   Living arrangements for the past 2 months: Apartment                                       Social Determinants of Health (SDOH) Interventions SDOH Screenings   Food Insecurity: No Food Insecurity (06/09/2023)  Housing: Low Risk  (06/09/2023)  Transportation Needs: No Transportation Needs (06/09/2023)  Utilities: Not At Risk (06/09/2023)  Social Connections: Unknown (10/03/2021)   Received from [facility]  Tobacco Use: Medium Risk (06/09/2023)    Readmission Risk Interventions     No data to display

## 2023-06-16 NOTE — Assessment & Plan Note (Signed)
Began after she was extubated. Likely just recovering post intubation. Was placed on midodrine by neurology, but was discontinued by cardiology to see how her BPs do and if further intervention is required at this time. - Monitor VS - Carvedilol discontinued and switched to Metoprolol succinate 25 mg daily.

## 2023-06-16 NOTE — Progress Notes (Addendum)
Daily Progress Note Intern Pager: [phone]  Patient name: [name] Medical record number: [id number] Date of birth: 04/01/1961 Age: 63 y.o. Gender: female  Primary Care Provider: Pcp, No Consultants: Neurology, Cardiology, CCM  Code Status: Full Code   Pt Overview and Major Events to Date:  1/17: Admitted 1/20: TEE  Intubated > ICU 1/21: Extubated 1/22: FMTS  Assessment and Plan: [name] is a 63 y.o. 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 TEE per cardiology today to further assess this as the likely cause of embolic stroke. Been hypotensive since being intubated, likely just recovering, now off Midodrine and pressures are improving. Assessment & Plan Stroke [facility]) Acute infarcts affecting the posterior left insula. Strength improved 5/5 bilateral LE/UE. LDL 40. A1C 5.9. TSH WNL. Echo on 1/14 unchanged from prior. SLP eval states no restrictions. TEE on 1/20 was abbreviated due to patient becoming hypoxic likely secondary to bronchospasm/laryngospasm and requiring intubation and short ICU admission. No clear source of embolism seen on TEE, otherwise LVEF 35-40%, trivial MVR and AVR. DVT US negative. - Neurology consulted, appreciate recommendations - Cardiology consulted, appreciate recommendations - ASA 81 mg  - Discontinued Midodrine, if hypotension persists, cardiology considering a R heart cath - Pradaxa 150 mg BID per cardiology instead of Eliquis - Cardiac monitoring - Neuro checks q4h - Fall precautions - Delirium precautions - PT/OT Chronic systolic heart failure (HCC) Euvolemic on exam. Caution restarting GDMT in setting of recent  CVA and soft blood pressures. Holding [place]. - Cardiology consulted, appreciate recommendations - Restart GDMT cautiously; restarted Farxiga 10 mg daily and Aldactone 12.5 mg - Carvedilol discontinued and switched to Metoprolol succinate 25 mg daily.  Hypotension Began after she was extubated. Likely just recovering post intubation. Was placed on midodrine by neurology, but was discontinued by cardiology to see how her BPs do and if further intervention is required at this time. - Monitor VS - Carvedilol discontinued and switched to Metoprolol succinate 25 mg daily.  Hypophosphatemia (Resolved: 06/16/2023) Phos 2.9. Improved.  Chronic and Stable Problems:  CKD: Cr stable T2DM: On Ozempic weekly.  Held inpatient. Check glucose on AM labs. S/p TAVR and MVR: Was on Eliquis 5 mg BID; on hold due to size of stroke per neurology. Switched to Pradaxa per cardiology while inpatient. HLD: Crestor 20 mg daily Hypothyroidism s/p thyroidectomy d/t papillary thyroid cancer: Restarted home synthroid 1/18   FEN/GI: Heart Healthy/Carb Modified PPx: Pradaxa Dispo: Home tomorrow  Subjective:  Patient is doing well this morning and has no concerns. BP has improved today. She feels like she would be comfortable going home tomorrow.   Objective: Temp:  [97.8 F (36.6 C)-98.3 F (36.8 C)] 98 F (36.7 C) (01/24 0739) Pulse Rate:  [65-83] 73 (01/24 0739) Resp:  [15-30] 18 (01/24 0739) BP: (97-121)/(65-91) 97/65 (01/24 0739) SpO2:  [95 %-100 %] 99 % (01/24 0835) Physical Exam: General: Awake and alert in NAD. Cardiovascular: RRR. No M/R/G. Respiratory: CTAB. Normal WOB on RA. No wheezing, crackles, rhonchi, or diminished breath sounds. Abdomen: Soft, non-tender, non-distended. Normoactive bowel sounds. Extremities: Able to move all extremities. Neuro: AAOx3. No focal neurological deficits  Laboratory: Most recent CBC Lab Results  Component Value Date   WBC 4.6 06/14/2023   HGB 12.0  06/14/2023    HCT 35.9 (L) 06/14/2023   MCV 86.5 06/14/2023   PLT 114 (L) 06/14/2023   Most recent BMP    Latest Ref Rng & Units 06/16/2023    6:40 AM  BMP  Glucose 70 - 99 mg/dL 73   BUN 8 - 23 mg/dL 9   Creatinine 4.69 - 6.29 mg/dL 5.28   Sodium 413 - 244 mmol/L 139   Potassium 3.5 - 5.1 mmol/L 3.7   Chloride 98 - 111 mmol/L 111   CO2 22 - 32 mmol/L 19   Calcium 8.9 - 10.3 mg/dL 8.2    Phos: 2.5  Imaging/Diagnostic Tests: No new imaging.  Fortunato Curling, DO 06/16/2023, 11:44 AM PGY-1, [facility]  FPTS Intern pager: [phone], text pages welcome Secure chat group [facility] Teaching Service

## 2023-06-16 NOTE — Progress Notes (Signed)
Speech Language Pathology Treatment: Cognitive-Linquistic  Patient Details Name: [name] MRN: [id number] DOB: 1960-07-06 Today's Date: 06/16/2023 Time: 4782-9562 SLP Time Calculation (min) (ACUTE ONLY): 16 min  Assessment / Plan / Recommendation Clinical Impression  Pt seen for cognitive intervention, pleasant and alert. She completed a pill box activity (determining if pill box was filled out correctly from visual handout). She did need moderate assist and SLP wondering if she has visual difficulties as she stated she was having some trouble seeing. Pt doesn't use a pill box at home, has her own system and states her daughter checks up to make sure she has taken her medication. Discussed memory strategies and pt states she enters appointments and things to recall later on her phone and checks daily. She looked at a mock menu and answered questions for problem solving with 85% accuracy only missing one. RN wrote number to cafeteria on white board and pt able to call and order own food, confirmed by RN. Feel pt is close to her baseline cognitive status. Do not recommend continued ST but discussed with pt that recommend daughter supervise pt taking her pills initially to ensure safety. Pt's daughter handles the finances. ST will sign off   HPI HPI: [name] is a 63 y.o. 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 MRI small acute infarcts affecting the posterior left insula and the left temporal lobe. 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      SLP Plan  Discharge SLP treatment due to (comment) (has necessary help at home)      Recommendations for follow up therapy  are one component of a multi-disciplinary discharge planning process, led by the attending physician.  Recommendations may be updated based on patient status, additional functional criteria and insurance authorization.    Recommendations                     Oral care BID   Intermittent Supervision/Assistance Cognitive communication deficit (R41.841)     Discharge SLP treatment due to (comment) (has necessary help at home)     Royce Macadamia  06/16/2023, 10:14 AM

## 2023-06-16 NOTE — Progress Notes (Signed)
Cardiology Progress Note  Patient ID: Vickie Melnik MRN: [id number] DOB: Sep 22, 1960 Date of Encounter: 06/16/2023 Primary Cardiologist: None  Subjective   Chief Complaint: None.   HPI: Blood pressure improved.  Out of ICU.  Request to go home tomorrow.  ROS:  All other ROS reviewed and negative. Pertinent positives noted in the HPI.     Telemetry  Overnight telemetry shows sinus rhythm 70s, which I personally reviewed.    Physical Exam   Vitals:   06/15/23 1959 06/15/23 2351 06/16/23 0448 06/16/23 0835  BP:  121/78 106/68   Pulse:  72 72   Resp:      Temp:  98.3 F (36.8 C) 97.9 F (36.6 C)   TempSrc:  Oral Oral   SpO2: 97% 100% 100% 99%  Weight:      Height:        Intake/Output Summary (Last 24 hours) at 06/16/2023 0939 Last data filed at 06/15/2023 1600 Gross per 24 hour  Intake --  Output 0 ml  Net 0 ml       06/09/2023   11:43 AM 06/09/2023   10:00 AM 05/04/2023    1:23 PM  Last 3 Weights  Weight (lbs) 178 lb 9.2 oz 178 lb 9.2 oz 181 lb 3.2 oz  Weight (kg) 81 kg 81 kg 82.192 kg    Body mass index is 38.64 kg/m.  General: Well nourished, well developed, in no acute distress Head: Atraumatic, normal size  Eyes: PEERLA, EOMI  Neck: Supple, no JVD Endocrine: No thryomegaly Cardiac: Normal S1, S2; RRR; no murmurs, rubs, or gallops Lungs: Clear to auscultation bilaterally, no wheezing, rhonchi or rales  Abd: Soft, nontender, no hepatomegaly  Ext: No edema, pulses 2+ Musculoskeletal: No deformities, BUE and BLE strength normal and equal Skin: Warm and dry, no rashes   Neuro: Alert and oriented to person, place, time, and situation, CNII-XII grossly intact, no focal deficits  Psych: Normal mood and affect   Cardiac Studies  TTE 06/12/2023  1. Left ventricular ejection fraction, by estimation, is 35 to 40%. The  left ventricle has moderately decreased function.   2. Right ventricular systolic function is mildly reduced. The right  ventricular size is  normal.   3. No left atrial/left atrial appendage thrombus was detected.   4. The mitral valve has been repaired/replaced. Trivial mitral valve  regurgitation. The mean mitral valve gradient is 6.0 mmHg with average  heart rate of 80 bpm. There is a 26 mm Sorin Memo prosthetic annuloplasty  ring present in the mitral position.  Procedure Date: 12/23/08.   5. The aortic valve has been repaired/replaced. Aortic valve  regurgitation is trivial. There is a 23 mm Sapien prosthetic (TAVR) valve  present in the aortic position. Procedure Date: 11/21/17. Aortic valve mean  gradient measures 9.0 mmHg.   Patient Profile  Hser Belanger is a 63 y.o. 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 She underwent transesophageal echo on 06/12/2023 and suffered acute hypoxic respiratory failure secondary to reactive airway disease. She is now extubated and doing well.   Assessment & Plan   # Left MCA stroke -Presumably cardioembolic in nature.  Was on aspirin and Eliquis prior to admission.  Has a TAVR valve with known history of leaflet thrombosis.  Underwent transesophageal echo with no identifiable thrombus. -We decided to switch her to Pradaxa preemptively to pursue a different mechanism of anticoagulation.  This  is reasonable.  Continue this at discharge.  # Acute respiratory failure secondary to bronchospasm # Influenza -Intubated during transesophageal echo.  Likely related to bronchospasm.  Has improved and recovered.  Doing well.  # Chronic systolic heart failure, EF 35-40% # Hypotension -Since her stay in the ICU for extubation she is struggled with hypotension.  No signs of shock.  Was on midodrine which we now stopped. -I have stopped her carvedilol and put her on metoprolol succinate 25 mg daily.  I have added back Aldactone 12.5 mg daily.  She is on Farxiga 10 mg daily. -Would  see how she does with medications this morning.  If she is able to tolerate them would recommend discharge today or tomorrow.  Would hold Entresto.  Overall there are no signs of cardiogenic shock.  Lactic acids are normal.  I think she does needs time to recover from stroke and respiratory failure before GDMT can be fully restarted. -No signs of volume overload.  She can take this as needed.  # Status post TAVR # Status post mitral valve repair -No thrombus identified on transesophageal echo.  Was transition to Pradaxa.  She will continue aspirin as well.  # CKD stage IIIa -Stable.   [facility] will sign off.   Medication Recommendations: As above. Other recommendations (labs, testing, etc): None Follow up as an outpatient: 1 to 2 weeks with heart failure clinic  For questions or updates, please contact [facility] Please consult [email or web address] for contact info under      Signed, Gerri Spore T. Flora Lipps, MD, [facility]  [facility]  06/16/2023 9:39 AM

## 2023-06-16 NOTE — Assessment & Plan Note (Addendum)
Euvolemic on exam. Caution restarting GDMT in setting of recent CVA and soft blood pressures. Holding [place]. - Cardiology consulted, appreciate recommendations - Restart GDMT cautiously; restarted Farxiga 10 mg daily and Aldactone 12.5 mg - Carvedilol discontinued and switched to Metoprolol succinate 25 mg daily.

## 2023-06-16 NOTE — Assessment & Plan Note (Addendum)
Acute infarcts affecting the posterior left insula. Strength improved 5/5 bilateral LE/UE. LDL 40. A1C 5.9. TSH WNL. Echo on 1/14 unchanged from prior. SLP eval states no restrictions. TEE on 1/20 was abbreviated due to patient becoming hypoxic likely secondary to bronchospasm/laryngospasm and requiring intubation and short ICU admission. No clear source of embolism seen on TEE, otherwise LVEF 35-40%, trivial MVR and AVR. DVT US negative. - Neurology consulted, appreciate recommendations - Cardiology consulted, appreciate recommendations - ASA 81 mg  - Discontinued Midodrine, if hypotension persists, cardiology considering a R heart cath - Pradaxa 150 mg BID per cardiology instead of Eliquis - Cardiac monitoring - Neuro checks q4h - Fall precautions - Delirium precautions - PT/OT

## 2023-06-16 NOTE — Assessment & Plan Note (Addendum)
Phos 2.9. Improved.

## 2023-06-17 ENCOUNTER — Other Ambulatory Visit (HOSPITAL_COMMUNITY): Payer: Self-pay

## 2023-06-17 DIAGNOSIS — I6310 Cerebral infarction due to embolism of unspecified precerebral artery: Secondary | ICD-10-CM | POA: Diagnosis not present

## 2023-06-17 MED ORDER — METOPROLOL SUCCINATE ER 25 MG PO TB24
25.0000 mg | ORAL_TABLET | Freq: Every day | ORAL | 0 refills | Status: DC
  Filled 2023-06-17: qty 30, 30d supply, fill #0

## 2023-06-17 NOTE — Progress Notes (Signed)
Daily Progress Note Intern Pager: [phone]  Patient name: [name] Medical record number: [id number] Date of birth: Aug 22, 1960 Age: 63 y.o. Gender: female  Primary Care Provider: Pcp, No Consultants: Neurology, Cardiology, CCM  Code Status: Full Code   Pt Overview and Major Events to Date:  1/17: Admitted 1/20: TEE  Intubated > ICU 1/21: Extubated 1/22: FMTS  Assessment and Plan: [name] is a 63 y.o. 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 TEE per cardiology today to further assess this as the likely cause of embolic stroke. Been hypotensive since being intubated, likely just recovering, now off Midodrine and pressures are improving. Assessment & Plan Stroke [facility]) Acute infarcts affecting the posterior left insula. Strength improved 5/5 bilateral LE/UE. LDL 40. A1C 5.9. TSH WNL. Echo on 1/14 unchanged from prior. SLP eval states no restrictions. TEE on 1/20 was abbreviated due to patient becoming hypoxic likely secondary to bronchospasm/laryngospasm and requiring intubation and short ICU admission. No clear source of embolism seen on TEE, otherwise LVEF 35-40%, trivial MVR and AVR. DVT US negative. - Neurology consulted, appreciate recommendations - Cardiology consulted, appreciate recommendations - ASA 81 mg  - Discontinued Midodrine, if hypotension persists, cardiology considering a R heart cath - Pradaxa 150 mg BID per cardiology instead of Eliquis - Cardiac monitoring - Neuro checks q4h - Fall precautions - Delirium precautions - PT/OT Chronic systolic heart failure (HCC) Euvolemic on exam. Caution restarting GDMT in setting of  recent CVA and soft blood pressures. Holding [place]. - Cardiology consulted, appreciate recommendations - Restart GDMT cautiously; restarted Farxiga 10 mg daily, Aldactone 12.5 mg, and Metoprolol succinate 25 mg daily.  - Follow up with heart failure clinic 1-2 weeks Hypotension Began after she was extubated. Likely just recovering post intubation. Was placed on midodrine by neurology, but was discontinued by cardiology to see how her BPs do and if further intervention is required at this time. - Monitor VS - Carvedilol discontinued and switched to Metoprolol succinate 25 mg daily.   Chronic and Stable Problems:  CKD: Cr stable T2DM: On Ozempic weekly.  Held inpatient. Check glucose on AM labs. S/p TAVR and MVR: Was on Eliquis 5 mg BID; on hold due to size of stroke per neurology. Switched to Pradaxa per cardiology while inpatient. HLD: Crestor 20 mg daily Hypothyroidism s/p thyroidectomy d/t papillary thyroid cancer: Restarted home synthroid 1/18   FEN/GI: Heart Healthy/Carb Modified PPx: Pradaxa Dispo: Home tomorrow  Subjective:  Patient is doing well this morning and has no concerns.  Discussed her hypotension with her and she denies any dizziness, lightheadedness, palpitations, chest pain or shortness of breath.  She reports that she has been eating and drinking as she normally would at home as well.  She is sitting comfortably in bed and was able to get up and go to the bathroom out without any symptoms.  Objective: Temp:  [97.3 F (36.3 C)-98.6 F (37 C)] 97.3 F (36.3 C) (01/25 0320) Pulse Rate:  [73-77] 77 (01/25 0320) Resp:  [18] 18 (01/25 0320) BP: (83-110)/(49-78) 83/62 (01/25 0320) SpO2:  [99 %-100 %] 99 % (01/25 0320) Physical Exam: General: Awake and alert in NAD. Cardiovascular: RRR. No M/R/G. Respiratory: CTAB. Normal WOB on RA. No wheezing, crackles, rhonchi,  or diminished breath sounds. Abdomen: Soft, non-tender, non-distended. Normoactive bowel  sounds. Extremities: Able to move all extremities. Neuro: AAOx3. No focal neurological deficits  Laboratory: Most recent CBC Lab Results  Component Value Date   WBC 4.6 06/14/2023   HGB 12.0 06/14/2023   HCT 35.9 (L) 06/14/2023   MCV 86.5 06/14/2023   PLT 114 (L) 06/14/2023   Most recent BMP    Latest Ref Rng & Units 06/16/2023    6:40 AM  BMP  Glucose 70 - 99 mg/dL 73   BUN 8 - 23 mg/dL 9   Creatinine 1.61 - 0.96 mg/dL 0.45   Sodium 409 - 811 mmol/L 139   Potassium 3.5 - 5.1 mmol/L 3.7   Chloride 98 - 111 mmol/L 111   CO2 22 - 32 mmol/L 19   Calcium 8.9 - 10.3 mg/dL 8.2    Imaging/Diagnostic Tests: No new imaging.  Fortunato Curling, DO 06/17/2023, 7:30 AM PGY-1, [facility] Family Medicine  FPTS Intern pager: [phone], text pages welcome Secure chat group [facility] Teaching Service

## 2023-06-17 NOTE — Assessment & Plan Note (Addendum)
Euvolemic on exam. Caution restarting GDMT in setting of recent CVA and soft blood pressures. Holding [place]. - Cardiology consulted, appreciate recommendations - Restart GDMT cautiously; restarted Farxiga 10 mg daily, Aldactone 12.5 mg, and Metoprolol succinate 25 mg daily.  - Follow up with heart failure clinic 1-2 weeks

## 2023-06-17 NOTE — Plan of Care (Signed)
  Problem: Education: Goal: Knowledge of General Education information will improve Description: Including pain rating scale, medication(s)/side effects and non-pharmacologic comfort measures Outcome: Progressing   Problem: Health Behavior/Discharge Planning: Goal: Ability to manage health-related needs will improve Outcome: Progressing   Problem: Clinical Measurements: Goal: Ability to maintain clinical measurements within normal limits will improve Outcome: Progressing Goal: Will remain free from infection Outcome: Progressing Goal: Diagnostic test results will improve Outcome: Progressing Goal: Respiratory complications will improve Outcome: Progressing Goal: Cardiovascular complication will be avoided Outcome: Progressing   Problem: Activity: Goal: Risk for activity intolerance will decrease Outcome: Progressing   Problem: Nutrition: Goal: Adequate nutrition will be maintained Outcome: Progressing   Problem: Coping: Goal: Level of anxiety will decrease Outcome: Progressing   Problem: Elimination: Goal: Will not experience complications related to bowel motility Outcome: Progressing Goal: Will not experience complications related to urinary retention Outcome: Progressing   Problem: Pain Managment: Goal: General experience of comfort will improve and/or be controlled Outcome: Progressing   Problem: Safety: Goal: Ability to remain free from injury will improve Outcome: Progressing   Problem: Skin Integrity: Goal: Risk for impaired skin integrity will decrease Outcome: Progressing   Problem: Education: Goal: Knowledge of patient specific risk factors will improve (DELETE if not current risk factor) Outcome: Progressing   Problem: Ischemic Stroke/TIA Tissue Perfusion: Goal: Complications of ischemic stroke/TIA will be minimized Outcome: Progressing   Problem: Self-Care: Goal: Ability to participate in self-care as condition permits will improve Outcome:  Progressing Goal: Verbalization of feelings and concerns over difficulty with self-care will improve Outcome: Progressing Goal: Ability to communicate needs accurately will improve Outcome: Progressing   Problem: Activity: Goal: Ability to tolerate increased activity will improve Outcome: Progressing   Problem: Respiratory: Goal: Ability to maintain a clear airway and adequate ventilation will improve Outcome: Progressing   Problem: Role Relationship: Goal: Method of communication will improve Outcome: Progressing

## 2023-06-17 NOTE — Assessment & Plan Note (Signed)
Began after she was extubated. Likely just recovering post intubation. Was placed on midodrine by neurology, but was discontinued by cardiology to see how her BPs do and if further intervention is required at this time. - Monitor VS - Carvedilol discontinued and switched to Metoprolol succinate 25 mg daily.

## 2023-06-17 NOTE — Discharge Summary (Addendum)
Family Medicine Teaching [facility] Discharge Summary  Patient name: [name] Medical record number: [id number] Date of birth: 1960/09/02 Age: 63 y.o. 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 Her hospital course is detailed below:  Stroke Presented to [facility] with right-sided weakness. Last well-known at 12:00 AM, patient outside of the window for IV TNK after arrival.  CT negative for acute intracranial hemorrhage but revealed minimal hyperdensity seen in the left insular M2 concerning for possible thrombus, and encephalomalacia in the L parietal and occipital lobes. CTA demonstrated focal embolus within the L inferior division of M2 branch with diminished distal perfusion and tortuous common carotid arteries bilaterally.  Embolus concerned to be from rheumatic heart valvular disease. Abbreviated TEE performed due to patient becoming hypoxic likely secondary to bronchospasm/laryngospasm and requiring intubation and short ICU admission. No clear source of embolism seen on TEE, otherwise LVEF 35-40%, trivial MVR and AVR. DVT US  negative.  HFrEF  Rheumatic Heart Disease s/p TAVR, MVR On Entresto, spironolactone, Farxiga, Lasix, and Coreg at home. Marcelline Deist and Spironolactone was the only medication that was continued inpatient and Coreg was switched to Metoprolol succinate 25 mg daily 2/2 to her hypotension. Was on Eliquis 5 mg BID which was held due to size of stroke per neurology and switched to Pradaxa prior to discharge.  Hypotension Became hypotensive since being extubated. Pt was started on Midodrine by neurology. This was discontinued upon discussion with cardiology who recommended R heart cath if this persisted. Prior to discharge, BP was still soft at night, but improved during the day. Carvedilol was switched to Metoprolol succinate 25 mg daily and patient remained asymptomatic. Cardiology recommended outpatient follow up and no indication for RHC in hospital.  Other chronic conditions were medically managed with home medications and formulary alternatives as necessary: CKD: Cr stable T2DM: On Ozempic weekly.  Held inpatient and monitored glucose with BMP. HLD: Crestor 20 mg daily Hypothyroidism s/p thyroidectomy d/t papillary thyroid cancer: Restarted home synthroid 1/18  PCP Follow-up Recommendations: Advise follow up with heart failure clinic on 2/3.  Restart GDMT as able based on her pressures. Repeat BMP, Mag and CBC to ensure stability with restarting medications.  Gomes to be her PCP at [facility]   Disposition: Home  Discharge Condition: Stable  Discharge Exam:  Vitals:   06/17/23 0320 06/17/23 0829  BP: (!) 83/62 110/75  Pulse: 77 78  Resp: 18   Temp: (!) 97.3 F (36.3 C) (!) 97.1 F (36.2 C)  SpO2: 99%    General: Awake and alert in NAD. Cardiovascular: RRR. No M/R/G. Respiratory: CTAB. Normal WOB on RA. No wheezing, crackles, rhonchi, or diminished breath sounds. Abdomen: Soft, non-tender, non-distended. Normoactive bowel sounds. Extremities: Able to move all extremities. Neuro:  AAOx3.  No focal neurological deficits  Significant Procedures: TEE and Intubation No clear source of embolism seen on TEE, otherwise LVEF 35-40%, trivial MVR and AVR. DVT US negative.  Significant Labs and Imaging:  No results for input(s): "WBC", "HGB", "HCT", "PLT" in the last 48 hours. Recent Labs  Lab 06/16/23 0640  NA 139  K 3.7  CL 111  CO2 19*  GLUCOSE 73  BUN 9  CREATININE 1.13*  CALCIUM 8.2*  PHOS 2.5   CT Head w/o contrast 1. No acute intracranial hemorrhage. ASPECTS is 10. 2. Minimal hyperdensity in the left insular M2, which could represent thrombus. 3. Redemonstrated encephalomalacia in the left parietal and occipital lobes.  CTA Head/Neck w/ w/o contrast 1. Focal embolus within the left inferior division M2 branch with diminished distal perfusion. 2. 49 mL of core infarct in the left parietal lobe. 53 mL of ischemic penumbra. 4 mL of mismatch. 3. No other significant vascular finding. Tortuous common carotid arteries bilaterally. No carotid bifurcation disease. 4. Early origin of the right vertebral artery from the proximal subclavian artery. This is a normal anatomic variant.  MRI Brain w/o contrast 1. Small acute infarcts affecting the posterior left insula and the left temporal lobe. No hemorrhage or mass effect. 2. 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  CXR 1. Low position of endotracheal tube, tip 1 cm above carina directed towards right mainstem bronchus. Recommend retraction by 2 cm. 2. Low lung volumes with bilateral atelectasis and/or infiltrates.  CT Head w/o contrast 1. 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 As compared to prior no hemorrhagic transformation or significant regional mass effect. 2. No other new acute intracranial abnormality.  Results/Tests Pending at Time of Discharge: none  Discharge Medications:  Allergies as of  06/17/2023       Reactions   Morphine And Codeine Other (See Comments)   Hallucinations         Medication List     PAUSE taking these medications    carvedilol 12.5 MG tablet Wait to take this until your doctor or other care provider tells you to start again. Commonly known as: COREG Take 1 tablet (12.5 mg total) by mouth 2 (two) times daily with a meal.   Entresto 49-51 MG Wait to take this until your doctor or other care provider tells you to start again. Generic drug: sacubitril-valsartan Take 1 tablet by mouth 2 (two) times daily.   furosemide 20 MG tablet Wait to take this until your doctor or other care provider tells you to start again. Commonly known as: LASIX Take 20 mg by mouth daily.       STOP taking these medications    apixaban 5 MG Tabs tablet Commonly known as: ELIQUIS   potassium chloride 10 MEQ tablet Commonly known as: KLOR-CON       TAKE these medications    acetaminophen 325 MG tablet Commonly known as: TYLENOL Take 325 mg by mouth daily.   albuterol 108 (90 Base) MCG/ACT inhaler Commonly known as: VENTOLIN HFA Inhale 2 puffs into the lungs every 6 (six) hours as needed for shortness of breath.   aspirin 81 MG chewable tablet Chew 1 tablet (81 mg total) by mouth daily.   beclomethasone 40 MCG/ACT inhaler Commonly known as: QVAR Inhale 2 puffs into the lungs daily as needed (shortness of breath).   dabigatran 150 MG Caps capsule Commonly known as: PRADAXA Take 1 capsule (  150 mg total) by mouth every 12 (twelve) hours.   dapagliflozin propanediol 10 MG Tabs tablet Commonly known as: FARXIGA Take 1 tablet (10 mg total) by mouth daily. What changed: when to take this   levothyroxine 100 MCG tablet Commonly known as: SYNTHROID Take 100 mcg by mouth daily before breakfast.   metoprolol succinate 25 MG 24 hr tablet Commonly known as: TOPROL-XL Take 1 tablet (25 mg total) by mouth daily. Start taking on: June 18, 2023    Ozempic (2 MG/DOSE) 8 MG/3ML Sopn Generic drug: Semaglutide (2 MG/DOSE) Inject 2 mg into the skin once a week.   rosuvastatin 20 MG tablet Commonly known as: CRESTOR Take 1 tablet (20 mg total) by mouth daily. What changed:  how much to take how to take this when to take this additional instructions   spironolactone 25 MG tablet Commonly known as: ALDACTONE Take 0.5 tablets (12.5 mg total) by mouth daily. What changed:  how much to take how to take this when to take this additional instructions        Discharge Instructions: Please refer to Patient Instructions section of EMR for full details.  Patient was counseled important signs and symptoms that should prompt return to medical care, changes in medications, dietary instructions, activity restrictions, and follow up appointments.   Follow-Up Appointments:  Follow-up Information     [facility]. Schedule an appointment as soon as possible for a visit.   Specialty: Rehabilitation Contact information: [address] [place] [address] [phone]        [facility]. Schedule an appointment as soon as possible for a visit in 1 month(s).   Specialty: Neurology Why: stroke clinic Contact information: [address] [place] [address] [phone]        Darral Dash, DO Follow up.   Specialty: Family Medicine Why: 1:30 PM Contact information: [address] [place] [address] [phone]                 Fortunato Curling, DO 06/17/2023, 10:20 AM PGY-1,  Family Medicine   Upper Level Addendum:  I have seen and evaluated this patient along with Dr. Fatima Blank and reviewed the above note, making necessary revisions as appropriate.  I agree with the medical decision making and physical exam as noted above.  Levin Erp, MD PGY-3 [facility] Family Medicine Residency

## 2023-06-17 NOTE — Assessment & Plan Note (Signed)
Acute infarcts affecting the posterior left insula. Strength improved 5/5 bilateral LE/UE. LDL 40. A1C 5.9. TSH WNL. Echo on 1/14 unchanged from prior. SLP eval states no restrictions. TEE on 1/20 was abbreviated due to patient becoming hypoxic likely secondary to bronchospasm/laryngospasm and requiring intubation and short ICU admission. No clear source of embolism seen on TEE, otherwise LVEF 35-40%, trivial MVR and AVR. DVT US negative. - Neurology consulted, appreciate recommendations - Cardiology consulted, appreciate recommendations - ASA 81 mg  - Discontinued Midodrine, if hypotension persists, cardiology considering a R heart cath - Pradaxa 150 mg BID per cardiology instead of Eliquis - Cardiac monitoring - Neuro checks q4h - Fall precautions - Delirium precautions - PT/OT

## 2023-06-19 ENCOUNTER — Ambulatory Visit (INDEPENDENT_AMBULATORY_CARE_PROVIDER_SITE_OTHER): Payer: Medicaid Other | Admitting: Student

## 2023-06-19 ENCOUNTER — Encounter: Payer: Self-pay | Admitting: Student

## 2023-06-19 VITALS — BP 108/79 | HR 91 | Ht <= 58 in | Wt 173.6 lb

## 2023-06-19 DIAGNOSIS — Z09 Encounter for follow-up examination after completed treatment for conditions other than malignant neoplasm: Secondary | ICD-10-CM | POA: Insufficient documentation

## 2023-06-19 DIAGNOSIS — I63412 Cerebral infarction due to embolism of left middle cerebral artery: Secondary | ICD-10-CM | POA: Diagnosis not present

## 2023-06-19 DIAGNOSIS — I5022 Chronic systolic (congestive) heart failure: Secondary | ICD-10-CM | POA: Diagnosis not present

## 2023-06-19 DIAGNOSIS — N189 Chronic kidney disease, unspecified: Secondary | ICD-10-CM

## 2023-06-19 DIAGNOSIS — I959 Hypotension, unspecified: Secondary | ICD-10-CM | POA: Diagnosis not present

## 2023-06-19 MED ORDER — BLOOD PRESSURE CUFF MISC: 0 refills | Status: DC

## 2023-06-19 NOTE — Progress Notes (Signed)
    SUBJECTIVE:   CHIEF COMPLAINT / HPI:   [name] is a 63 year old female here with her daughter for hospital follow-up, in which she was admitted for CVA secondary to possible embolism.  She did require brief ICU admission due to hypoxia from bronchospasm/laryngeal spasm. Discharge on Pradaxa, which she has been taking as prescribed.  She was previously on Entresto, spironolactone, Farxiga, Lasix and Coreg for HFrEF.  While inpatient, Lasix, Entresto and Coreg were held.  She was transition from Coreg to metoprolol succinate 25 mg daily due to hypotension.  Daughter said she has been taking two tablets of her Spironolactone 25 mg daily.  However, it is noted that she was post to be taking half tablet to equal 12.5 mg daily.  They also had some confusion as to whether she was supposed to continue her carvedilol or not, so she has been taking both carvedilol and metoprolol.  Patient denies any chest pain, shortness of breath, weakness, confusion.  PERTINENT  PMH / PSH: T2DM-on Ozempic CKD Hyperlipidemia-Crestor 20 mg daily Hypothyroidism-Synthroid  OBJECTIVE:   BP 108/79   Pulse 91   Ht 4\' 9"  (1.448 m)   Wt 173 lb 9.6 oz (78.7 kg)   SpO2 100%   BMI 37.57 kg/m   General: Chronically ill-appearing, but generally well-appearing.  Ambulates independently.  In good spirits HEENT: Edentulous upper mouth Cardiac: Regular rate and rhythm Respiratory: Normal effort on room air.  Lungs are clear in all fields. Neuro: A&O x 4.  Speech is clear and fluent.  She has good strength in bilateral upper and lower extremities, no focal weakness.   ASSESSMENT/PLAN:   Hospital discharge follow-up Appears well today.  No new focal neurological findings. Unfortunately, had some confusion regarding her medications which we clarified today.  I have written a medication chart with morning and afternoon medicines in addition to dosage and number of tablets to be taken.  Reviewed that she  will be following up with heart failure clinic on February 3. Labs obtained today, per discharge recommendations with CBC, BMP, magnesium prior to resuming rest of GDMT medications  Patient to schedule appointment to establish care with Dr. Fatima Blank and resume GDMT medications as appropriate. Close follow-up in 1 week to make sure that she is taking her medications as prescribed without difficulty.  Stroke (HCC) -Continue Pradaxa 150 mg twice daily -Continue aspirin 81 mg daily  Hypotension Hemodynamically stable today. Blood pressure is on the softer end, although this is likely because she was taking both her carvedilol and metoprolol in addition to a double dose of her spironolactone. -Continue metoprolol succinate 25 mg daily -Discontinued carvedilol in the medication list  CKD (chronic kidney disease) BMP today.  Chronic systolic heart failure (HCC) Euvolemic today.  Lungs are clear on auscultation. -Continue to hold Entresto and Lasix in light of softer blood pressures, and accidental overuse of beta-blockers and spironolactone -Continue spironolactone 12.5 mg daily (half tablet of 50 mg), and metoprolol succinate 25 mg daily -Resume above medications as appropriate -Heart failure clinic follow-up next week, and follow-up with Dr. Rexene Alberts on 2/3    Darral Dash, DO [facility]

## 2023-06-19 NOTE — Assessment & Plan Note (Signed)
-  Continue Pradaxa 150 mg twice daily -Continue aspirin 81 mg daily

## 2023-06-19 NOTE — Patient Instructions (Addendum)
It was great seeing you today.  As we discussed, - Take your medications as prescribed. - Please come back at your next visit in 1 week, and we will add back some medications as appropriate. - Please check your blood pressure and monitor your weight daily, log this on a sheet of paper and bring to your next visit along with all of your medications. - We checked your blood work today. If anything is abnormal, I will be in touch with you.   If you have any questions or concerns, please feel free to call the clinic.   Have a wonderful day,  Dr. Darral Dash [facility] Family Medicine [phone]

## 2023-06-19 NOTE — Assessment & Plan Note (Signed)
BMP today

## 2023-06-19 NOTE — Assessment & Plan Note (Signed)
Hemodynamically stable today. Blood pressure is on the softer end, although this is likely because she was taking both her carvedilol and metoprolol in addition to a double dose of her spironolactone. -Continue metoprolol succinate 25 mg daily -Discontinued carvedilol in the medication list

## 2023-06-19 NOTE — Assessment & Plan Note (Signed)
Euvolemic today.  Lungs are clear on auscultation. -Continue to hold Entresto and Lasix in light of softer blood pressures, and accidental overuse of beta-blockers and spironolactone -Continue spironolactone 12.5 mg daily (half tablet of 50 mg), and metoprolol succinate 25 mg daily -Resume above medications as appropriate -Heart failure clinic follow-up next week, and follow-up with Dr. Rexene Alberts on 2/3

## 2023-06-19 NOTE — Assessment & Plan Note (Addendum)
Appears well today.  No new focal neurological findings. Unfortunately, had some confusion regarding her medications which we clarified today.  I have written a medication chart with morning and afternoon medicines in addition to dosage and number of tablets to be taken.  Reviewed that she will be following up with heart failure clinic on February 3. Labs obtained today, per discharge recommendations with CBC, BMP, magnesium prior to resuming rest of GDMT medications  Patient to schedule appointment to establish care with Dr. Fatima Blank and resume GDMT medications as appropriate. Close follow-up in 1 week to make sure that she is taking her medications as prescribed without difficulty.

## 2023-06-20 LAB — BASIC METABOLIC PANEL
BUN/Creatinine Ratio: 9 — ABNORMAL LOW (ref 12–28)
BUN: 11 mg/dL (ref 8–27)
CO2: 19 mmol/L — ABNORMAL LOW (ref 20–29)
Calcium: 9.4 mg/dL (ref 8.7–10.3)
Chloride: 107 mmol/L — ABNORMAL HIGH (ref 96–106)
Creatinine, Ser: 1.2 mg/dL — ABNORMAL HIGH (ref 0.57–1.00)
Glucose: 95 mg/dL (ref 70–99)
Potassium: 4.8 mmol/L (ref 3.5–5.2)
Sodium: 143 mmol/L (ref 134–144)
eGFR: 51 mL/min/{1.73_m2} — ABNORMAL LOW (ref >59)

## 2023-06-20 LAB — CBC
Hematocrit: 42.1 % (ref 34.0–46.6)
Hemoglobin: 13.5 g/dL (ref 11.1–15.9)
MCH: 28.7 pg (ref 26.6–33.0)
MCHC: 32.1 g/dL (ref 31.5–35.7)
MCV: 89 fL (ref 79–97)
Platelets: 153 10*3/uL (ref 150–450)
RBC: 4.71 x10E6/uL (ref 3.77–5.28)
RDW: 15.3 % (ref 11.7–15.4)
WBC: 7.2 10*3/uL (ref 3.4–10.8)

## 2023-06-20 LAB — MAGNESIUM: Magnesium: 2.3 mg/dL (ref 1.6–2.3)

## 2023-06-23 NOTE — Progress Notes (Incomplete)
Advanced Heart Failure Clinic Note   PCP: Fortunato Curling, DO Cardiology: Dr. Herbie Baltimore HF Cardiology: Dr. Shirlee Latch  Chief complaint: Heart failure follow up   63 y.o. with history of rheumatic heart disease s/p aortic and mitral valve repairs in 2010. She had aortic and mitral repairs in 2010.  Afterwards, EF was mildly low in the 40-45% range.  However, EF had dropped to 25-30% on 1/18 echo.  TEE showed severe MR, moderate AI, moderate aortic stenosis.  RHC/LHC in 2/18 showed significantly elevated LV and RV filling pressures.    At her initial appointment here, she was very dyspneic with exertion, with orthopnea and PND. Her meds were adjusted and Lasix increased.  She has seen Dr. Cornelius Moras since that time for evaluation for redo valve surgery.  She would require high risk mitral and aortic valve replacements. Plan was to follow closely w/ Dr. Cornelius Moras for monitoring for surgery if further decline/ frequent CHF exacerbations. CPX was done in 3/18 showing moderate to severely decreased functional capacity.  RHC in 4/18 showed optimized filling pressures but low cardiac output.  Repeat echo in 8/18 showed EF 30-35%, severe AI with moderate AS but only mild MR noted.  The RV was mildly dilated with mildly decreased systolic function.   TEE 06/28/2017: EF 45%, severe AI, mild to moderate AS, mild mitral valve stenosis s/p MV repair  In 7/19, she had TAVR with #23 Edwards Sapien 3 THV.  Echo post-op in 7/19 showed EF 30-35%, moderate LVH, bioprosthetic aortic valve looked ok, s/p MV repair with mild mitral stenosis.   She developed pain in her right leg and was found to have occluded right EIA on dopplers in 9/19, confirmed by CTA. Suspect this was a vascular access complication of TAVR.  She was seen by Dr. Arbie Cookey who recommended conservative treatment for the time being.  Echo in 2/20 showed that EF remains 30-35%, stable TAVR valve, s/p MVR repair with no regurgitation or significant stenosis.   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 There was concern for possible partial valve thrombosis and gated CT ordered to assess aortic valve.  Echo in 6/23 showed EF 30-35% with wall motion abnormalities, moderate RV dysfunction, s/p mitral valve repair with trivial MR and mean gradient 4 mmHg, bioprosthetic aortic valve s/p TAVR with mean gradient 49 mmHg and AVA 0.67 cm^2. 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 Patient was started on Eliquis.   Repeat gated CTA chest on apixaban in 11/23 showed minimal restriction to bioprosthetic aortic valve excursion with no significant thrombus, significant improvement.    Echo 1/24, EF up to 40-45% with apical HKS, mild RV dysfunction, mild mitral stenosis s/p MV repair with mean gradient 6 mmHg, bioprosthetic aortic valve with mean gradient 13 mmHg (improved), normal IVC.   Admitted 1/25 with acute stroke. CTA demonstrated focal embolus within the L inferior division of M2 branch with diminished distal perfusion and tortuous common carotid arteries bilaterally. Embolus concerned to be from rheumatic heart valvular disease. Stay complicated by bronchospasm/laryngospasm requiring intubation during TEE> ICU. TEE: LVEF 35-40%, trivial MVR and AVR. Switched to pradaxa.   Today he returns  for post hospital follow up with her daughter. Overall feeling good. Working on being more active, goes for walks. Denies palpitations, CP, dizziness, edema, or PND/Orthopnea. Denies SOB. Appetite ok. No fever or chills. Taking all medications, brought them with her. Has not been taking Entresto. Denies ETOH, tobacco or  drug use.    PMH: 1. HTN 2. Hypothyroidism 3. Hyperlipidemia 4. Rheumatic heart disease: s/p MV repair and aortic valve repair in 2010, Dr. Cornelius Moras. - TEE (2/18): EF 35-30%, dilated LV, rheumatic-appearing aortic valve s/p repair with moderate AS (AVA 1.1 cm^2), moderate AI.  MV s/p repair with mild-moderate mitral stenosis and eccentric severe MR.   - Echo (8/18): Severe AI but only moderate AS, MR was only mild on this study (may have missed full jet) with mean MV gradient 4 mmHg.  - TEE 06/28/2017: EF 45%, severe AI, mild to moderate AS, mild mitral valve stenosis s/p MV repair - In 7/19, she had TAVR with #23 Edwards Sapien 3 THV.   - Echo post-op in 7/19 showed EF 30-35%, moderate LVH, bioprosthetic aortic valve looked ok, s/p MV repair with mild mitral stenosis.  - Echo in 11/22 showed TAVR valve with significant stenosis and regurgitation => mild-moderate PVL with mean gradient 29 mmHg and DI 0.22.  There was mild stenosis of the repaired mitral valve.  - Gated CTA chest (6/23) showed HALT/HAM involving all 3 leaflets of the bioprosthetic aortic valve with restriction to motion - Gated CTA chest (11/23, on apixaban): minimal restriction to bioprosthetic aortic valve excursion with no significant thrombus, significant improvement.   5. Chronic systolic CHF: Nonischemic cardiomyopathy, probably related to valvular disease.   - Echo (11/15): EF 40-45%, - Echo (12/16): EF 35-40%. - Echo (1/18): EF 25-30%.  - LHC/RHC (2/18): Normal coronaries, mean RA 18, mean PA 74/39 mean 52, mean PCWP 31, CI 1.98 Fick. - CPX (3/18): peak VO2 11.7, VE/VCO2 slope 40, RER 1.11 => moderate to severely decreased functional capacity due to HF.  - RHC (4/18): mean RA 4, PA 44/17 mean 28, mean PCWP 15, CI 1.73, PVR 4 WU.  - Echo (8/18): EF 30-35%, severe AI with moderate AS but only mild MR noted with mean MV gradient 4 mmHg.  The RV was mildly dilated with mildly decreased systolic function. - TEE 2/19: EF 45%,  severe aortic insufficiency, mild to moderate AS, mild mitral valve stenosis s/p MV repair - Coronary CTA (5/19): No obstructive coronary disease.  - Echo (7/19): EF 30-35%, moderate LVH, bioprosthetic aortic valve looked ok, s/p MV repair with mild mitral stenosis.  - Echo (2/20): EF 30-35%, moderate LVH with diffuse hypokinesis, mildly decreased RV systolic function, bioprosthetic aortic valve s/p TAVR without significant stenosis or regurgitation, s/p MV repair with mean gradient 3 mmHg and no MR.  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  - Echo (6/23):  EF 30-35% with wall motion abnormalities, moderate RV dysfunction, s/p mitral valve repair with trivial MR and mean gradient 4 mmHg, bioprosthetic aortic valve s/p TAVR with mean gradient 49 mmHg and AVA 0.67 cm^2.  - Echo (1/24): EF 40-45% with apical hypokinesis, mild RV dysfunction, mild mitral stenosis s/p MV repair with mean gradient 6 mmHg, bioprosthetic aortic valve with mean gradient 13 mmHg (improved), normal IVC.  6. Carotid dopplers (6/19): No significant disease.  7. PAD: 9/19 peripheral arterial dopplers showed total occluded proximal right external iliac artery.  - CTA confirmed total occlusion of R EIA with collaterals.  Suspect TAVR vascular access complication.  - Peripheral arterial dopplers (6/23): totally occluded right mid to distal R CFA. - ABIs (8/24): 0.73 right, 0.94 left.   Social History   Socioeconomic History   Marital status: Single    Spouse name: Not on file   Number of children: Not on file   Years of education: Not on file   Highest education level: Not on file  Occupational History   Not on file  Tobacco Use   Smoking status: Former    Current packs/day: 0.00    Average packs/day: 0.1 packs/day  for 15.0 years (1.5 ttl pk-yrs)    Types: Cigarettes    Start date: 66    Quit date: 2011    Years since quitting: 14.1   Smokeless tobacco: Never  Vaping Use   Vaping status: Never Used  Substance and Sexual Activity   Alcohol use: Never   Drug use: Never   Sexual activity: Not Currently  Other Topics Concern   Not on file  Social History Narrative   Unemployed, on disability.   Now is finally reestablished with a PCP.   She is a former smoker, quit in 2011. Denies alcohol consumption   Tries to walk routinely.   Social Drivers of Corporate investment banker Strain: Not on file  Food Insecurity: No Food Insecurity (06/09/2023)   Hunger Vital Sign    Worried About Running Out of Food in the Last Year: Never true    Ran Out of Food in the Last Year: Never true  Transportation Needs: No Transportation Needs (06/09/2023)   PRAPARE - Administrator, Civil Service (Medical): No    Lack of Transportation (Non-Medical): No  Physical Activity: Not on file  Stress: Not on file  Social Connections: Unknown (10/03/2021)   Received [facility], [facility]   Social Network    Social Network: Not on file  Intimate Partner Violence: Not At Risk (06/09/2023)   Humiliation, Afraid, Rape, and Kick questionnaire    Fear of Current or Ex-Partner: No    Emotionally Abused: No    Physically Abused: No    Sexually Abused: No   Family History  Problem Relation Age of Onset   Heart disease Mother    CVA Mother    Review of systems complete and found to be negative unless listed in HPI.    Current Outpatient Medications  Medication Sig Dispense Refill   acetaminophen (TYLENOL) 325 MG tablet Take 325 mg by mouth daily.     albuterol (PROVENTIL HFA;VENTOLIN HFA) 108 (90 BASE) MCG/ACT inhaler Inhale 2 puffs into the lungs every 6 (six) hours as needed for shortness of breath.      aspirin 81 MG chewable tablet Chew 1 tablet (81 mg total) by mouth daily. 30 tablet 3    beclomethasone (QVAR) 40 MCG/ACT inhaler Inhale 2 puffs into the lungs daily as needed (shortness of breath).     Blood Pressure Monitoring (BLOOD PRESSURE CUFF) MISC Please check your blood pressure daily 1 each 0   dabigatran (PRADAXA) 150 MG CAPS capsule Take 1 capsule (150 mg total) by mouth every 12 (twelve) hours. 60 capsule 0   dapagliflozin propanediol (FARXIGA) 10 MG TABS tablet Take 1 tablet (10 mg total) by mouth daily. 30 tablet 1   levothyroxine (SYNTHROID) 100 MCG  tablet Take 100 mcg by mouth daily before breakfast.     metoprolol succinate (TOPROL-XL) 25 MG 24 hr tablet Take 1 tablet (25 mg total) by mouth daily. 30 tablet 0   rosuvastatin (CRESTOR) 20 MG tablet Take 1 tablet (20 mg total) by mouth daily. 30 tablet 1   Semaglutide, 2 MG/DOSE, (OZEMPIC, 2 MG/DOSE,) 8 MG/3ML SOPN Inject 2 mg into the skin once a week. 3 mL 6   spironolactone (ALDACTONE) 25 MG tablet Take 0.5 tablets (12.5 mg total) by mouth daily. 15 tablet 0   [Paused] furosemide (LASIX) 20 MG tablet Take 20 mg by mouth daily. (Patient not taking: Reported on 06/26/2023)     [Paused] sacubitril-valsartan (ENTRESTO) 49-51 MG Take 1 tablet by mouth 2 (two) times daily. (Patient not taking: Reported on 06/26/2023) 60 tablet 11   No current facility-administered medications for this encounter.   BP 120/84   Pulse 81   Ht 4\' 9"  (1.448 m)   Wt 78.8 kg (173 lb 12.8 oz)   SpO2 97%   BMI 37.61 kg/m   Wt Readings from Last 3 Encounters:  06/26/23 78.8 kg (173 lb 12.8 oz)  06/19/23 78.7 kg (173 lb 9.6 oz)  06/09/23 81 kg (178 lb 9.2 oz)   PHYSICAL EXAM:  General:  elderly/well appearing.  No respiratory difficulty. Walked into clinic HEENT: normal Neck: supple. JVD ~7 cm. Carotids 2+ bilat; no bruits. No lymphadenopathy or thyromegaly appreciated. Cor: PMI nondisplaced. Regular rate & rhythm. No rubs, gallops or murmurs. Lungs: clear Abdomen: soft, nontender, nondistended. No hepatosplenomegaly. No bruits or masses.  Good bowel sounds. Extremities: no cyanosis, clubbing, rash, edema  Neuro: alert & oriented x 3, cranial nerves grossly intact. moves all 4 extremities w/o difficulty. Affect pleasant.   EKG: NSR 80s (Personally reviewed)    Assessment/Plan: 1. 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  She had aortic and mitral valve repairs in 2010.   It was felt that she would not be a very good Mitraclip candidate as she already has a degree of mitral stenosis.  TEE in 2/18 showed moderate AS/moderate AI , mild to moderate MS, severe eccentric MR.  Echo in 8/18 showed moderate AS/severe AI but only mild mitral stenosis and mild MR noted. Hemodynamics looked better on 4/18 RHC though cardiac output still marginal.  Repeat TEE 06/28/2017: EF 45%, severe aortic insufficiency, mild to moderate AS, mild mitral valve stenosis s/p MV repair with trivial MR. She had TAVR in 7/19, feeling much better since then.  Echo 6/21 showed stable bioprosthetic aortic valve, s/p mitral valve repair without significant stenosis or regurgitation. Echo 11/22,? partial-valve thrombosis, elevated mean gradient 29 mm Hg, previously 22 mm HG 6/21. Echo in 6/23 showed mean gradient up to 49 mmHg with AVA 0.67 cm^2.  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  Eliquis was started. Repeat gated CTA chest in 11/23 showed no significant restriction to bioprosthetic aortic valve excursion with no significant thrombus => marked improvement.  Echo 1/24 showed mean gradient down to 13 mmHg across the aortic valve. Echo 1/25 mean gradient down to 12.6 mmHg. TEE 1/25: AV mean gradient 9 mmHg - Continue Pradaxa 150 mg BID long-term. No bleeding issues.  - Continue ASA 81 daily. 2. Chronic systolic CHF:  Nonischemic cardiomyopathy (no significant disease on 2/18 LHC).  It is possible that this cardiomyopathy is driven by her  valvular  disease. Echo (8/18) with EF 30-35% RV mildly dilated. TEE 06/28/2017: EF 45%, severe aortic insufficiency, mild to moderate AS, mild mitral valve stenosis s/p MV repair with only trivial MR. Post-TAVR echo showed EF down to 30-35%, and echo in 2/20 showed EF stable in 30-35% range.  This may reflect myocardial dysfunction from prior long-standing aortic insufficiency.  Echo 6/21 showed EF 40-45%. Echo (11/22): EF 35-40%. Echo in 6/23 showed EF 30-35%.  Echo 1/24 was improved with EF 40-45% with apical hypokinesis, mild RV dysfunction, mild mitral stenosis s/p MV repair with mean gradient 6 mmHg, bioprosthetic aortic valve with mean gradient 13 mmHg (improved), normal IVC.   Echo 1/25: EF 35-40%. TEE 1/25: EF 35-40%, RV mildly reduced, trivial MR, TAVR present Aortic valve mean gradient 9 mmHg - NYHA I-II. Volume status okay.  Overall doing well.  - Restart lasix 20 mg daily.  - Continue Farxiga 10 mg  - Restart Entresto 24-26 mg bid. Had not been taking. BMET/BNP today. Repeat BMET ~10 days - Continue spironolactone 12.5 mg daily.  - Continue metoprolol XL 25 mg daily 3. PAD: Occluded right EIA likely due to TAVR vascular access complication.  Occluded right CFA on last doppler study. Has seen [facility], plan for medical management. Denies claudication  - Vascular following. No change in current management unless devlopes pain at rest or nonhealing ulcer. 4. Hyperlipidemia: was on Crestor, restart at 10 mg daily  - LDL 40 1/25 5. Obesity: Body mass index is 37.61 kg/m. - Back on Ozempic. Has lost ~20lbs. Congratulated. 6. 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 Thressa Sheller, NP 06/26/2023

## 2023-06-26 ENCOUNTER — Ambulatory Visit (HOSPITAL_COMMUNITY)
Admit: 2023-06-26 | Discharge: 2023-06-26 | Disposition: A | Discharge status: Home / Self Care | Payer: Medicaid Other | Attending: Internal Medicine | Admitting: Internal Medicine

## 2023-06-26 ENCOUNTER — Ambulatory Visit: Payer: Medicaid Other | Admitting: Family Medicine

## 2023-06-26 ENCOUNTER — Encounter (HOSPITAL_COMMUNITY): Payer: Self-pay

## 2023-06-26 ENCOUNTER — Encounter: Payer: Self-pay | Admitting: Family Medicine

## 2023-06-26 VITALS — BP 120/84 | HR 81 | Ht <= 58 in | Wt 173.8 lb

## 2023-06-26 VITALS — BP 110/80 | HR 80 | Ht <= 58 in | Wt 173.6 lb

## 2023-06-26 DIAGNOSIS — E785 Hyperlipidemia, unspecified: Secondary | ICD-10-CM | POA: Diagnosis not present

## 2023-06-26 DIAGNOSIS — Z7982 Long term (current) use of aspirin: Secondary | ICD-10-CM | POA: Diagnosis not present

## 2023-06-26 DIAGNOSIS — I428 Other cardiomyopathies: Secondary | ICD-10-CM | POA: Insufficient documentation

## 2023-06-26 DIAGNOSIS — I739 Peripheral vascular disease, unspecified: Secondary | ICD-10-CM

## 2023-06-26 DIAGNOSIS — I6340 Cerebral infarction due to embolism of unspecified cerebral artery: Secondary | ICD-10-CM

## 2023-06-26 DIAGNOSIS — Z6837 Body mass index (BMI) 37.0-37.9, adult: Secondary | ICD-10-CM | POA: Diagnosis not present

## 2023-06-26 DIAGNOSIS — Z79899 Other long term (current) drug therapy: Secondary | ICD-10-CM | POA: Insufficient documentation

## 2023-06-26 DIAGNOSIS — I050 Rheumatic mitral stenosis: Secondary | ICD-10-CM | POA: Insufficient documentation

## 2023-06-26 DIAGNOSIS — E669 Obesity, unspecified: Secondary | ICD-10-CM | POA: Diagnosis not present

## 2023-06-26 DIAGNOSIS — Z7902 Long term (current) use of antithrombotics/antiplatelets: Secondary | ICD-10-CM | POA: Diagnosis not present

## 2023-06-26 DIAGNOSIS — Z7984 Long term (current) use of oral hypoglycemic drugs: Secondary | ICD-10-CM | POA: Diagnosis not present

## 2023-06-26 DIAGNOSIS — I38 Endocarditis, valve unspecified: Secondary | ICD-10-CM

## 2023-06-26 DIAGNOSIS — Z9889 Other specified postprocedural states: Secondary | ICD-10-CM | POA: Insufficient documentation

## 2023-06-26 DIAGNOSIS — I110 Hypertensive heart disease with heart failure: Secondary | ICD-10-CM | POA: Insufficient documentation

## 2023-06-26 DIAGNOSIS — Z09 Encounter for follow-up examination after completed treatment for conditions other than malignant neoplasm: Secondary | ICD-10-CM

## 2023-06-26 DIAGNOSIS — I5022 Chronic systolic (congestive) heart failure: Secondary | ICD-10-CM

## 2023-06-26 LAB — BASIC METABOLIC PANEL
Anion gap: 10 (ref 5–15)
BUN: 14 mg/dL (ref 8–23)
CO2: 22 mmol/L (ref 22–32)
Calcium: 9.6 mg/dL (ref 8.9–10.3)
Chloride: 109 mmol/L (ref 98–111)
Creatinine, Ser: 1.07 mg/dL — ABNORMAL HIGH (ref 0.44–1.00)
GFR, Estimated: 59 mL/min — ABNORMAL LOW (ref >60)
Glucose, Bld: 113 mg/dL — ABNORMAL HIGH (ref 70–99)
Potassium: 4.1 mmol/L (ref 3.5–5.1)
Sodium: 141 mmol/L (ref 135–145)

## 2023-06-26 LAB — BRAIN NATRIURETIC PEPTIDE: B Natriuretic Peptide: 132 pg/mL — ABNORMAL HIGH (ref 0.0–100.0)

## 2023-06-26 MED ORDER — ROSUVASTATIN CALCIUM 20 MG PO TABS: 20.0000 mg | ORAL_TABLET | Freq: Every day | ORAL | 1 refills | Status: DC

## 2023-06-26 MED ORDER — ENTRESTO 24-26 MG PO TABS: 1.0000 | ORAL_TABLET | Freq: Two times a day (BID) | ORAL | 3 refills | Status: DC

## 2023-06-26 MED ORDER — FUROSEMIDE 20 MG PO TABS: 20.0000 mg | ORAL_TABLET | Freq: Every day | ORAL | 3 refills | Status: DC

## 2023-06-26 MED ORDER — ENTRESTO 49-51 MG PO TABS: 1.0000 | ORAL_TABLET | Freq: Two times a day (BID) | ORAL | 11 refills | Status: DC

## 2023-06-26 NOTE — Progress Notes (Cosign Needed)
    SUBJECTIVE:   CHIEF COMPLAINT / HPI:   Seen for hospital follow-up on 1/17 (hospitalized for stroke), there was confusion regarding her medications at discharge from the hospital - Was supposed to be taking Pradaxa 150 mg twice daily, aspirin 81 mg daily, metoprolol succinate 25 mg daily, spironolactone 12.5 mg daily - Had been taking metoprolol succinate in addition to her carvedilol that should have been discontinued, along with 2 tablets of spironolactone 25 mg daily - Previously on Entresto, Lasix but these medications were held in the hospital  Saw HF clinic today - advised restarting lasix 20mg  daily, entresto 24-26mg  BID  Patient presents with all of her medications that she has been taking today  No concerns or complaints today, states she is feeling really well and really does not have any residual deficit from her stroke  PERTINENT  PMH / PSH:  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 TEE 06/12/23 with LVEF 35-40%, LV moderately decreased function, mildly reduced RV systolic function HFrEF -rheumatic heart disease, history of TAVR, MVR, on Pradaxa CKD Type 2 diabetes - ozempic weekly, farxiga Hyperlipidemia - crestor 20mg  dailiy Hypothyroidism s/p thyroidectomy d/t papillary thyroid cancer - on synthroid  OBJECTIVE:   BP 110/80   Pulse 80   Ht 4\' 9"  (1.448 m)   Wt 173 lb 9.6 oz (78.7 kg)   SpO2 98%   BMI 37.57 kg/m   General: well appearing, NAD, face symmetric Cardiovascular: RRR, no m/r/g Respiratory: normal work of breathing on RA, CTAB Extremities: No swelling BLE  ASSESSMENT/PLAN:   Hospital discharge follow-up Patient presented with all of her medications today and we went through each one.  She seems to have a good grasp of which medications she is supposed to be taking now. Medication list in Epic updated and current. Heart failure clinic advised her today to restart her Lasix 20 mg daily and Entresto  24-26mg  BID, patient plans to pick these up tomorrow. - Continue Pradaxa 150 mg twice daily, aspirin 81 mg daily, metoprolol succinate 25 mg daily, and spironolactone 12.5 mg daily - Start Entresto and Lasix tomorrow as prescribed - Follow-up with neurology tomorrow as scheduled - Follow-up with [place] in 5 to 6 months or sooner if any new changes or concerns     Para March, DO [facility]

## 2023-06-26 NOTE — Patient Instructions (Signed)
Medication Changes:  RESTART: ROSUVASTATIN 10MG  ONCE DAILY   RESTART: LASIX (FUROSEMIDE) 20MG  ONCE DAILY   RESTART: ENTRESTO 24/26MG  TWICE DAILY   Lab Work:  Labs done today, your results will be available in MyChart, we will contact you for abnormal readings  THEN AGAIN IN 1 WEEK AS SCHEDULED   Follow-Up in: 3 months with Dr. Shirlee Latch PLEASE CALL OUR OFFICE AROUND APRIL 2025 TO GET SCHEDULED FOR YOUR APPOINTMENT. PHONE NUMBER IS [phone] OPTION 2   At the Advanced [facility] Failure Clinic, you and your health needs are our priority. We have a designated team specialized in the treatment of Heart Failure. This Care Team includes your primary Heart Failure Specialized Cardiologist (physician), Advanced Practice Providers (APPs- Physician Assistants and Nurse Practitioners), and Pharmacist who all work together to provide you with the care you need, when you need it.   You may see any of the following providers on your designated Care Team at your next follow up:  Dr. Arvilla Meres Dr. Marca Ancona Dr. Dorthula Nettles Dr. Theresia Bough Tonye Becket, NP Robbie Lis, [place] [facility] [place], [place] Brynda Peon, NP [place] Lee, NP Karle Plumber, PharmD   Please be sure to bring in all your medications bottles to every appointment.   Need to Contact us:  If you have any questions or concerns before your next appointment please send [place] a message through [place] or call our office at [phone].    TO LEAVE A MESSAGE FOR THE NURSE SELECT OPTION 2, PLEASE LEAVE A MESSAGE INCLUDING: YOUR NAME DATE OF BIRTH CALL BACK NUMBER REASON FOR CALL**this is important as we prioritize the call backs  YOU WILL RECEIVE A CALL BACK THE SAME DAY AS LONG AS YOU CALL BEFORE 4:00 PM

## 2023-06-26 NOTE — Addendum Note (Signed)
Encounter addended by: Baird Cancer, RN on: 06/26/2023 11:07 AM  Actions taken: Pharmacy for encounter modified, Order list changed

## 2023-06-26 NOTE — Patient Instructions (Signed)
It was great to see you today! Thank you for choosing [facility] for your primary care. Adella Nissen was seen for medication reconciliation.  Today we addressed: I am glad we got your medications in order.  Please continue taking them as prescribed and give [place] a call if you are about to run out of anything. Please do not miss any doses of your Pradaxa  If you haven't already, sign up for My Chart to have easy access to your labs results, and communication with your primary care physician.  I recommend that you always bring your medications to each appointment as this makes it easy to ensure you are on the correct medications and helps [place] not miss refills when you need them.  You should return to our clinic in 5-6 months for follow up Please arrive 15 minutes before your appointment to ensure smooth check in process.  We appreciate your efforts in making this happen.  Thank you for allowing me to participate in your care, Para March, DO 06/26/2023, 3:45 PM PGY-1, [facility]

## 2023-06-26 NOTE — Assessment & Plan Note (Addendum)
Patient presented with all of her medications today and we went through each one.  She seems to have a good grasp of which medications she is supposed to be taking now. Medication list in Epic updated and current. Heart failure clinic advised her today to restart her Lasix 20 mg daily and Entresto 24-26mg  BID, patient plans to pick these up tomorrow. - Continue Pradaxa 150 mg twice daily, aspirin 81 mg daily, metoprolol succinate 25 mg daily, and spironolactone 12.5 mg daily - Start Entresto and Lasix tomorrow as prescribed - Follow-up with neurology tomorrow as scheduled - Follow-up with [place] in 5 to 6 months or sooner if any new changes or concerns

## 2023-06-27 ENCOUNTER — Ambulatory Visit: Payer: Medicaid Other | Attending: Family Medicine

## 2023-06-27 ENCOUNTER — Ambulatory Visit: Payer: Medicaid Other | Admitting: Occupational Therapy

## 2023-06-27 ENCOUNTER — Ambulatory Visit: Payer: Medicaid Other

## 2023-06-27 ENCOUNTER — Encounter: Payer: Self-pay | Admitting: Occupational Therapy

## 2023-06-27 VITALS — BP 116/82 | HR 83

## 2023-06-27 DIAGNOSIS — M6281 Muscle weakness (generalized): Secondary | ICD-10-CM

## 2023-06-27 DIAGNOSIS — R4701 Aphasia: Secondary | ICD-10-CM

## 2023-06-27 DIAGNOSIS — R29818 Other symptoms and signs involving the nervous system: Secondary | ICD-10-CM | POA: Insufficient documentation

## 2023-06-27 DIAGNOSIS — R41841 Cognitive communication deficit: Secondary | ICD-10-CM | POA: Diagnosis not present

## 2023-06-27 DIAGNOSIS — R471 Dysarthria and anarthria: Secondary | ICD-10-CM | POA: Insufficient documentation

## 2023-06-27 DIAGNOSIS — R278 Other lack of coordination: Secondary | ICD-10-CM

## 2023-06-27 DIAGNOSIS — I6310 Cerebral infarction due to embolism of unspecified precerebral artery: Secondary | ICD-10-CM | POA: Diagnosis not present

## 2023-06-27 DIAGNOSIS — R2681 Unsteadiness on feet: Secondary | ICD-10-CM | POA: Diagnosis present

## 2023-06-27 NOTE — Therapy (Signed)
 OUTPATIENT PHYSICAL THERAPY NEURO EVALUATION   Patient Name: [name] MRN: [id number] DOB:[birth year], 63 y.o., female Today's Date: 06/27/2023   PCP: Kathrine Melena, DO REFERRING PROVIDER: Otto Fairly, MD  END OF SESSION:  PT End of Session - 06/27/23 1243     Visit Number 1    Number of Visits 1    Authorization Type [place] medicaid prepaid    PT Start Time 1315    PT Stop Time 1342    PT Time Calculation (min) 27 min    Activity Tolerance Patient tolerated treatment well    Behavior During Therapy WFL for tasks assessed/performed             Past Medical History:  Diagnosis Date   Chronic combined systolic and diastolic CHF (congestive heart failure) (HCC)    EF now down to 25-30%. LVEDP on cath was 30 mmHg with wedge pressure of 31 mmHg.   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  mean PA 74/39 mean 52, mean PCWP 31 (with V wave of 45 mmHg), LVEDP ~ 30 mmHg.   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 Progressive worsening of aortic and mitral valve disease.   Past Surgical History:  Procedure Laterality Date   AORTIC VALVE REPAIR  August 2010   Suture plication of all 3 commissures   CARDIAC CATHETERIZATION  August 2010   Preop: nonobstructive coronary disease   CESAREAN SECTION  1981; 1996   MITRAL VALVE REPAIR  August 2010    26 mm Sorin [facility] 3D Ring Annuloplasty   RIGHT HEART CATH N/A 08/30/2016   Procedure: Right Heart Cath;  Surgeon: Ezra GORMAN Shuck, MD;  Location: [facility] INVASIVE CV LAB;  Service: Cardiovascular;  Laterality: N/A;   RIGHT/LEFT HEART CATH AND CORONARY ANGIOGRAPHY N/A 07/08/2016   Procedure: Right/Left Heart Cath and Coronary Angiography;  Surgeon: Alm LELON Clay, MD;  Location: [facility] INVASIVE CV LAB;  Service: Cardiovascular: Normal coronaries, mean RA 18, RV pressure/EDP: 70/12/18 mmHg.  mean PA 74/39 mean 52, mean PCWP 31 (with V wave of 45 mmHg), LVEDP ~ 30 mmHg.  CI 1.98 Fick. Peak AoV  gradient ~15 mHg     TEE WITHOUT CARDIOVERSION N/A 07/13/2016   Procedure: Transesophageal Echocardiogram (TEE);  Surgeon: Vinie JAYSON Maxcy, MD;  Location: [facility] ENDOSCOPY;  Service: Cardiovascular:  EF 25-30%, dilated LV - diffuse hypokinesis, rheumatic-appearing Aortic Vallve s/p repair with moderate AS (AVA 1.1 cm^2), moderate central AI.  MV s/p repeat with mild mitral stenosis and eccentric severe MR.     TEE WITHOUT CARDIOVERSION     TEE WITHOUT CARDIOVERSION N/A 06/28/2017   Procedure: TRANSESOPHAGEAL ECHOCARDIOGRAM (TEE);  Surgeon: Rolan Ezra RAMAN, MD;  Location: [facility] ENDOSCOPY;  Service: Cardiovascular;  Laterality: N/A;   TEE WITHOUT CARDIOVERSION N/A 11/21/2017   Procedure:  TRANSESOPHAGEAL ECHOCARDIOGRAM (TEE);  Surgeon: Wonda Sharper, MD;  Location: [facility] OR;  Service: Open Heart Surgery;  Laterality: N/A;   THYROID  LOBECTOMY Left 09/17/2009   which showed a 3.5 cm follicular variant papillary cancer   THYROIDECTOMY     2nd OR on my thyroid  they took out the whole thing   TRANSCATHETER AORTIC VALVE REPLACEMENT, TRANSFEMORAL  11/21/2017   Transcatheter Aortic Valve Replacement - Percutaneous Right Transfemoral Approach   TRANSCATHETER AORTIC VALVE REPLACEMENT, TRANSFEMORAL N/A 11/21/2017   Procedure: TRANSCATHETER AORTIC VALVE REPLACEMENT, TRANSFEMORAL using a 23mm Edwards Sapien 3 Aortic Valve;  Surgeon: Wonda Sharper, MD;  Location[facility] OR;  Service: Open Heart Surgery;  Laterality: N/A;   TRANSESOPHAGEAL ECHOCARDIOGRAM (CATH LAB) N/A 06/12/2023   Procedure: TRANSESOPHAGEAL ECHOCARDIOGRAM;  Surgeon: Kate Lonni CROME, MD;  Location: [facility] INVASIVE CV LAB;  Service: Cardiovascular;  Laterality: N/A;   TRANSTHORACIC ECHOCARDIOGRAM  05/2016   EF 25-30%. Moderate aortic stenosis with moderate regurgitation. Also potential aortic stenosis with likely worse than expected regurgitation   TRANSTHORACIC ECHOCARDIOGRAM  May 2012    EF 40-45%, inferior hypokinesis. Slightly increased transaortic velocity = mild AS with mild to moderate AI normal MV gradients with no MR. Significantly improved PA pressures. Paradoxical septal motion.   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    Pulmonary hypertension (HCC)    Valvular cardiomyopathy (HCC)    History of GI bleeding    Grade II hemorrhoids 07/06/2016   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   Papillary thyroid  carcinoma (HCC) 05/10/2016   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 03/31/2014   Acute on chronic combined systolic and diastolic CHF (congestive heart failure) (HCC)    Obesity, Class III, BMI 40-49.9 (morbid obesity) (HCC)    Neck pain 03/05/2014   Hyperlipidemia with target LDL less than 100 12/16/2013   Hypothyroidism 12/16/2013   Hx of thyroid  cancer 12/16/2013   Chronic chest wall pain 10/11/2012   TIA (transient ischemic attack) 05/31/2012   Cardiomyopathy (HCC) 02/28/2012   CHF (congestive heart failure) (HCC) 02/28/2012   Depression 02/13/2012   HTN (hypertension) 02/06/2012   Presence of prosthetic heart valve 02/06/2012   Post-menopausal bleeding 12/05/2011   Fibroids 12/05/2011   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   S/P aortic valve repair 12/23/2008   S/P MVR (mitral valve repair) 12/23/2008   Other specified postprocedural states 12/23/2008    ONSET  DATE: 06/12/23 referral  REFERRING DIAG: I63.10 (ICD-10-CM) - 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 No AD. Patient unsure of why she's here. She had 2 strokes 1/17. Since leaving the hospital, she has returned to normal life and baseline. Was not using an AD prior either. Dtr only  notices that with a lot of talking, she'll start to stutter.  Pt accompanied by: family member  PERTINENT HISTORY: CHF, CKD, dementia, HTN, HLD, hypothyroidism, papillary carcinoma, s/p AVR, MVR, TAVR  PAIN:  Are you having pain? No  PRECAUTIONS: None   WEIGHT BEARING RESTRICTIONS: No  FALLS: Has patient fallen in last 6 months? No  LIVING ENVIRONMENT: Lives with: lives with their family Lives in: House/apartment Stairs: No Has following equipment at home: None  PLOF: Independent, not driving, not working  PATIENT GOALS: Patient unsure why she's here  OBJECTIVE:  Note: Objective measures were completed at Evaluation unless otherwise noted.  DIAGNOSTIC FINDINGS: 06/10/23 brain MRI IMPRESSION: 1. Small acute infarcts affecting the posterior left insula and the left temporal lobe. No hemorrhage or mass effect. 2. 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  COGNITION: Overall cognitive status: Within functional limits for tasks assessed   SENSATION: WFL  COORDINATION: WFL heel shin/figure 8 BLE  POSTURE: rounded shoulders  LOWER EXTREMITY MMT:    MMT Right Eval Left Eval  Hip flexion 4 4  Hip extension    Hip abduction 4 4  Hip adduction 4 4  Hip internal rotation    Hip external rotation    Knee flexion 4 4  Knee extension 4 4  Ankle dorsiflexion 4 4  Ankle plantarflexion    Ankle inversion    Ankle eversion    (Blank rows = not tested)  BED MOBILITY:  independent   GAIT: Gait pattern: WFL Distance walked: clinic Assistive device utilized: None Level of assistance: Complete Independence Comments: baseline gait speed  FUNCTIONAL TESTS:   [facility] PT Assessment - 06/27/23 0001       Standardized Balance Assessment   Standardized Balance Assessment  Timed Up and Go Test    Five times sit to stand comments  16s no UE    10 Meter Walk .46m/s      Timed Up and Go Test   Normal TUG (seconds) 11.7       Functional Gait  Assessment   Gait assessed  Yes    Gait Level Surface Walks 20 ft in less than 7 sec but greater than 5.5 sec, uses assistive device, slower speed, mild gait deviations, or deviates 6-10 in outside of the 12 in walkway width.    Change in Gait Speed Able to change speed, demonstrates mild gait deviations, deviates 6-10 in outside of the 12 in walkway width, or no gait deviations, unable to achieve a major change in velocity, or uses a change in velocity, or uses an assistive device.    Gait with Horizontal Head Turns Performs head turns smoothly with no change in gait. Deviates no more than 6 in outside 12 in walkway width    Gait with Vertical Head Turns Performs head turns with no change in gait. Deviates no more than 6 in outside 12 in walkway width.    Gait and Pivot Turn Pivot turns safely in greater than 3 sec and stops with no loss of balance, or pivot turns safely within 3 sec and stops with mild imbalance, requires small steps to catch balance.    Step Over Obstacle Is able to step over one shoe box (4.5 in total height) without changing gait speed. No evidence of imbalance.    Gait with Narrow Base of Support Is able to ambulate for 10 steps heel to toe with no staggering.    Gait with Eyes Closed Walks 20 ft, uses assistive device, slower speed, mild gait deviations, deviates 6-10 in outside 12 in walkway width. Ambulates 20 ft in less than 9 sec but greater than 7 sec.    Ambulating Backwards Walks 20 ft, uses assistive device, slower speed, mild gait deviations, deviates 6-10 in outside 12 in walkway width.    Steps Alternating feet, must use rail.    Total Score 23                                                                                                                                           TREATMENT  N/a eval   PATIENT EDUCATION: Education details: PT POC, exam findings Person educated: Patient and Child(ren) Education method:  Explanation Education comprehension: verbalized understanding  GOALS: Not indicated as patient does not require skilled PT services at this time  ASSESSMENT:  CLINICAL IMPRESSION: Patient is a 63 y.o. female who was seen today for physical therapy evaluation and treatment for mobility impairments s/p CVA. Per patient and her adult daughter, she is essentially back at her baseline. Her exam findings are grossly on the cusp of fall risk/not fall risk. Educated patient on importance  of medication compliance and continued physical activity to tolerance. Patient and daughter verbalized understanding. Patient scored a 23/30 on  Functional Gait Assessment.   <22/30 = predictive of falls, <20/30 = fall in 6 months, <18/30 = predictive of falls in PD MCID: 5 points stroke population, 4 points geriatric population (ANPTA Core Set of Outcome Measures for Adults with Neurologic Conditions, 2018). Five times Sit to Stand Test (FTSS) Method: Use a straight back chair with a solid seat that is 17-18" high. Ask participant to sit on the chair with arms folded across their chest.   Instructions: "Stand up and sit down as quickly as possible 5 times, keeping your arms folded across your chest."   Measurement: Stop timing when the participant touches the chair in sitting the 5th time.  TIME: 16 sec  Cut off scores indicative of increased fall risk: >12 sec CVA, >16 sec PD, >13 sec vestibular (ANPTA Core Set of Outcome Measures for Adults with Neurologic Conditions, 2018). Patient completed the Timed Up and Go test (TUG) in 11.7 seconds.  Geriatrics: need for further assessment of fall risk: >/= 12 sec; Recurrent falls: > 15 sec; Vestibular Disorders fall risk: > 15 sec; Parkinson's Disease fall risk: > 16 sec ([email or web address], 2023). 10 Meter Walk Test: Patient instructed to walk 10 meters (32.8 ft) as quickly and as safely as possible at their normal speed x2 and at a fast speed x2. Time measured from 2 meter  mark to 8 meter mark to accommodate ramp-up and ramp-down.  Normal speed: .67m/s Cut off scores: <0.4 m/s = household Ambulator, 0.4-0.8 m/s = limited community Ambulator, >0.8 m/s = community Ambulator, >1.2 m/s = crossing a street, <1.0 = increased fall risk MCID 0.05 m/s (small), 0.13 m/s (moderate), 0.06 m/s (significant)  (ANPTA Core Set of Outcome Measures for Adults with Neurologic Conditions, 2018). Patient does not require skilled PT services at this time.    CLINICAL DECISION MAKING: Stable/uncomplicated  EVALUATION COMPLEXITY: Low  PLAN:  PT FREQUENCY: one time visit  PT DURATION: other: 1x visit   Delon DELENA Pop, PT Delon DELENA Pop, PT, DPT, CBIS  06/27/2023, 1:47 PM

## 2023-06-27 NOTE — Therapy (Signed)
 OUTPATIENT SPEECH LANGUAGE PATHOLOGY EVALUATION   Patient Name: [name] MRN: [id number] DOB:Mar 30, 1961, 63 y.o., female Today's Date: 06/27/2023  PCP: Janna Ferrier DO REFERRING PROVIDER: Donah Laymon PARAS, MD  END OF SESSION:  End of Session - 06/27/23 1451     Visit Number 1    Number of Visits 1    Authorization Type Medicaid HB    SLP Start Time 1402    SLP Stop Time  1445    SLP Time Calculation (min) 43 min    Activity Tolerance Patient tolerated treatment well             Past Medical History:  Diagnosis Date   Chronic combined systolic and diastolic CHF (congestive heart failure) (HCC)    EF now down to 25-30%. LVEDP on cath was 30 mmHg with wedge pressure of 31 mmHg.   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   Post-menopausal bleeding 12/05/2011   Pulmonary hypertension (HCC) 05/2016   By cardiac catheterization: mean RA 18, RV pressure/EDP: 70/12/18 mmHg.  mean PA 74/39 mean 52, mean PCWP 31 (with V wave of 45 mmHg), LVEDP ~ 30 mmHg.   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 Progressive worsening of aortic and mitral valve disease.   Past Surgical History:  Procedure Laterality Date   AORTIC VALVE REPAIR  August 2010   Suture plication of all 3 commissures   CARDIAC CATHETERIZATION  August 2010   Preop: nonobstructive coronary disease   CESAREAN SECTION  1981; 1996   MITRAL VALVE REPAIR  August 2010    26 mm Sorin [facility] 3D Ring Annuloplasty   RIGHT HEART CATH N/A 08/30/2016   Procedure: Right Heart Cath;  Surgeon: Ezra GORMAN Shuck, MD;  Location: [facility] INVASIVE CV LAB;  Service: Cardiovascular;  Laterality: N/A;   RIGHT/LEFT HEART CATH AND CORONARY ANGIOGRAPHY N/A 07/08/2016   Procedure: Right/Left Heart Cath and Coronary Angiography;  Surgeon: Alm LELON Clay, MD;  Location: [facility] INVASIVE CV LAB;  Service: Cardiovascular: Normal coronaries, mean RA 18, RV pressure/EDP: 70/12/18 mmHg.  mean PA 74/39 mean 52, mean PCWP 31 (with V wave of 45 mmHg), LVEDP ~ 30 mmHg.  CI 1.98 Fick. Peak AoV gradient ~15 mHg     TEE WITHOUT CARDIOVERSION N/A  07/13/2016   Procedure: Transesophageal Echocardiogram (TEE);  Surgeon: Vinie JAYSON Maxcy, MD;  Location: [facility] ENDOSCOPY;  Service: Cardiovascular:  EF 25-30%, dilated LV - diffuse hypokinesis, rheumatic-appearing Aortic Vallve s/p repair with moderate AS (AVA 1.1 cm^2), moderate central AI.  MV s/p repeat with mild mitral stenosis and eccentric severe MR.     TEE WITHOUT CARDIOVERSION     TEE WITHOUT CARDIOVERSION N/A 06/28/2017   Procedure: TRANSESOPHAGEAL ECHOCARDIOGRAM (TEE);  Surgeon: Rolan Ezra RAMAN, MD;  Location: [facility] ENDOSCOPY;  Service: Cardiovascular;  Laterality: N/A;   TEE WITHOUT CARDIOVERSION N/A 11/21/2017   Procedure: TRANSESOPHAGEAL ECHOCARDIOGRAM (TEE);  Surgeon: Wonda Sharper, MD;  Location: [facility] OR;  Service: Open Heart Surgery;  Laterality: N/A;   THYROID  LOBECTOMY Left 09/17/2009   which showed a 3.5 cm follicular variant papillary cancer   THYROIDECTOMY     2nd OR on my thyroid  they took out the whole thing   TRANSCATHETER AORTIC VALVE REPLACEMENT, TRANSFEMORAL  11/21/2017   Transcatheter Aortic Valve Replacement - Percutaneous Right Transfemoral Approach   TRANSCATHETER AORTIC VALVE REPLACEMENT, TRANSFEMORAL N/A 11/21/2017   Procedure: TRANSCATHETER AORTIC VALVE REPLACEMENT, TRANSFEMORAL using a 23mm Edwards Sapien 3 Aortic Valve;  Surgeon: Wonda Sharper, MD;  Location: [facility] OR;  Service: Open Heart Surgery;  Laterality: N/A;   TRANSESOPHAGEAL ECHOCARDIOGRAM (CATH LAB) N/A 06/12/2023   Procedure: TRANSESOPHAGEAL ECHOCARDIOGRAM;  Surgeon: Kate Lonni CROME, MD;  Location: [facility] INVASIVE CV LAB;  Service: Cardiovascular;  Laterality: N/A;   TRANSTHORACIC ECHOCARDIOGRAM  05/2016   EF 25-30%. Moderate aortic stenosis with moderate regurgitation. Also potential aortic stenosis with likely worse than expected regurgitation   TRANSTHORACIC ECHOCARDIOGRAM  May 2012    EF 40-45%, inferior hypokinesis. Slightly increased transaortic velocity = mild AS with mild to moderate AI normal MV gradients with no MR. Significantly improved PA pressures. Paradoxical septal motion.   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   Pulmonary hypertension (HCC)    Valvular  cardiomyopathy (HCC)    History of GI bleeding    Grade II hemorrhoids 07/06/2016   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   Papillary thyroid  carcinoma (HCC) 05/10/2016   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 03/31/2014   Acute on chronic combined systolic and diastolic CHF (congestive heart failure) (HCC)    Obesity, Class III, BMI 40-49.9 (morbid obesity) (HCC)    Neck pain 03/05/2014   Hyperlipidemia with target LDL less than 100 12/16/2013   Hypothyroidism 12/16/2013   Hx of thyroid  cancer 12/16/2013   Chronic chest wall pain 10/11/2012   TIA (transient ischemic attack) 05/31/2012   Cardiomyopathy (HCC) 02/28/2012   CHF (congestive heart failure) (HCC) 02/28/2012   Depression 02/13/2012   HTN (hypertension) 02/06/2012   Presence of prosthetic heart valve 02/06/2012   Post-menopausal bleeding 12/05/2011   Fibroids 12/05/2011   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 Daughter reports occasional stuttering during longer conversations. Pt able to pause and resume conversation without difficulty  Pt accompanied by: family member  PERTINENT HISTORY: Nyasha Rahilly is a 63 y.o. female with past medical history of rheumatic heart valve disease s/p MVR and TAVR, papillary thyroid  carcinoma follicular variant (s/p thyroidectomy), hypothyroidism, pulmonary HTN, valvular cardiomyopathy/HF, HLD, CKD, HTN, TIA, T2DM, history of GIB presenting with right-sided weakness. MRI small acute  infarcts affecting the posterior left insula and the left temporal lobe. 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   PAIN: Are you having pain? No  FALLS: Has patient fallen in last 6 months?  No  LIVING ENVIRONMENT: Lives with: lives with their family Lives in: House/apartment  PLOF:  Level of assistance: Independent with ADLs, Independent with IADLs Employment: On disability   PATIENT GOALS: Declined ST   OBJECTIVE:  Note: Objective measures were completed at Evaluation unless otherwise noted.  COGNITION: Overall cognitive status: Within functional limits for tasks assessed Functional deficits: Denied cognitive deficits   COGNITIVE COMMUNICATION: Following directions: Follows one step commands consistently  Auditory comprehension: WFL Verbal expression: Presents with anomia x1, semantic paraphasia x1, and occasional imprecise articulation. Endorsed some observed deficits are baseline. Denied concern when SLP discussed observations.   ORAL MOTOR EXAMINATION: Overall status: Impaired:   Lingual: Bilateral (Coordination) (minimal)  STANDARDIZED ASSESSMENTS: B[place]ON NAMING TEST: 48/60 (self-corrections x2; benefited from semantic, phonemic, and written cues)  Performance may have been slightly impacted by prior knowledge and baseline education level                                                                                                         TREATMENT DATE:  06/27/23: eval only    PATIENT EDUCATION: Education details: POC, recommendations Person educated: Patient and Child(ren) Education method: Explanation Education comprehension: verbalized understanding   ASSESSMENT:  CLINICAL IMPRESSION: Patient is a 63 y.o. F who was seen today for ST evaluation s/p stroke. Pt denied any concerns or changes in her communication, cognition, voice, or swallowing. Daughter reported occasional  stuttering in longer conversations (>15 minutes). Described whole word dysfluency (ex: and and and). Engaged patient in extensive discourse with anomia x1 and dysnomia x1 with self-correction noted given extra time. Minimal reduced articulatory precision noted in conversation, which could be baseline and/or related to lacking dentition. BNT revealed some dysnomia and anomia, which pt felt was near baseline. SLP offered SLP intervention to address mild aphasia and mild dysarthria; however, pt and daughter declined at this time in hopes of spontaneous recovery. No skilled ST scheduled at this time per patient request. Recommended repeat referral if communication challenges do not improve or decline. Both verbalized understanding.  OBJECTIVE IMPAIRMENTS: include aphasia and dysarthria. These impairments are limiting patient from effectively communicating at home  and in community. Factors affecting potential to achieve goals and functional outcome are  desire for therapy . Patient will benefit from skilled SLP services to address above impairments and improve overall function.  REHAB POTENTIAL: Good  PLAN:  SLP FREQUENCY: one time visit    Comer LILLETTE Louder, CCC-SLP 06/27/2023, 3:42 PM

## 2023-06-27 NOTE — Therapy (Signed)
 OUTPATIENT OCCUPATIONAL THERAPY NEURO EVALUATION  Patient Name: [name] MRN: [id number] DOB:02-03-1961, 63 y.o., female Today's Date: 06/27/2023  PCP: Janna Ferrier, DO REFERRING PROVIDER: Donah Laymon PARAS, MD  END OF SESSION:  OT End of Session - 06/27/23 1325     Visit Number 1    Number of Visits 5    Date for OT Re-Evaluation 07/28/23    Authorization Type [employer] Auth Reqd VL: 27    OT Start Time 1447    OT Stop Time 1530    OT Time Calculation (min) 43 min    Equipment Utilized During Treatment Testing material    Activity Tolerance Patient tolerated treatment well    Behavior During Therapy WFL for tasks assessed/performed             Past Medical History:  Diagnosis Date   Chronic combined systolic and diastolic CHF (congestive heart failure) (HCC)    EF now down to 25-30%. LVEDP on cath was 30 mmHg with wedge pressure of 31 mmHg.   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  mean PA 74/39 mean 52, mean PCWP 31 (with V wave of 45 mmHg), LVEDP ~ 30 mmHg.   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 Progressive worsening of aortic and mitral valve disease.   Past Surgical History:  Procedure Laterality Date   AORTIC VALVE REPAIR  August 2010   Suture plication of all 3 commissures   CARDIAC CATHETERIZATION  August 2010   Preop: nonobstructive coronary disease   CESAREAN SECTION  1981; 1996   MITRAL VALVE REPAIR  August 2010    26 mm Sorin [facility] 3D Ring Annuloplasty   RIGHT HEART CATH N/A 08/30/2016   Procedure: Right Heart Cath;  Surgeon: Ezra GORMAN Shuck, MD;  Location: [facility] INVASIVE CV LAB;  Service: Cardiovascular;  Laterality: N/A;   RIGHT/LEFT HEART CATH AND CORONARY ANGIOGRAPHY N/A 07/08/2016   Procedure: Right/Left Heart Cath and Coronary Angiography;  Surgeon: Alm LELON Clay, MD;  Location: [facility] INVASIVE CV LAB;  Service: Cardiovascular: Normal coronaries, mean RA 18, RV pressure/EDP: 70/12/18 mmHg.  mean PA 74/39 mean 52,  mean PCWP 31 (with V wave of 45 mmHg), LVEDP ~ 30 mmHg.  CI 1.98 Fick. Peak AoV gradient ~15 mHg     TEE WITHOUT CARDIOVERSION N/A 07/13/2016   Procedure: Transesophageal Echocardiogram (TEE);  Surgeon: Vinie JAYSON Maxcy, MD;  Location: [facility] ENDOSCOPY;  Service: Cardiovascular:  EF 25-30%, dilated LV - diffuse hypokinesis, rheumatic-appearing Aortic Vallve s/p repair with moderate AS (AVA 1.1 cm^2), moderate central AI.  MV s/p repeat with mild mitral stenosis and eccentric severe MR.     TEE WITHOUT CARDIOVERSION     TEE WITHOUT CARDIOVERSION N/A 06/28/2017   Procedure: TRANSESOPHAGEAL ECHOCARDIOGRAM (TEE);  Surgeon: Rolan Ezra RAMAN, MD;  Location: [facility]  ENDOSCOPY;  Service: Cardiovascular;  Laterality: N/A;   TEE WITHOUT CARDIOVERSION N/A 11/21/2017   Procedure: TRANSESOPHAGEAL ECHOCARDIOGRAM (TEE);  Surgeon: Wonda Sharper, MD;  Location: [facility] OR;  Service: Open Heart Surgery;  Laterality: N/A;   THYROID  LOBECTOMY Left 09/17/2009   which showed a 3.5 cm follicular variant papillary cancer   THYROIDECTOMY     2nd OR on my thyroid  they took out the whole thing   TRANSCATHETER AORTIC VALVE REPLACEMENT, TRANSFEMORAL  11/21/2017   Transcatheter Aortic Valve Replacement - Percutaneous Right Transfemoral Approach   TRANSCATHETER AORTIC VALVE REPLACEMENT, TRANSFEMORAL N/A 11/21/2017   Procedure: TRANSCATHETER AORTIC VALVE REPLACEMENT, TRANSFEMORAL using a 23mm Edwards Sapien 3 Aortic Valve;  Surgeon: Wonda Sharper, MD;  Location: [facility] OR;  Service: Open Heart Surgery;  Laterality: N/A;   TRANSESOPHAGEAL ECHOCARDIOGRAM (CATH LAB) N/A 06/12/2023   Procedure: TRANSESOPHAGEAL ECHOCARDIOGRAM;  Surgeon: Kate Lonni CROME, MD;  Location: [facility] INVASIVE CV LAB;  Service: Cardiovascular;  Laterality: N/A;   TRANSTHORACIC ECHOCARDIOGRAM  05/2016   EF 25-30%. Moderate aortic stenosis with moderate regurgitation. Also potential aortic stenosis with likely worse than expected regurgitation   TRANSTHORACIC ECHOCARDIOGRAM  May 2012    EF 40-45%, inferior hypokinesis. Slightly increased transaortic velocity = mild AS with mild to moderate AI normal MV gradients with no MR. Significantly improved PA pressures. Paradoxical septal motion.   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   Pulmonary hypertension (HCC)    Valvular cardiomyopathy (HCC)    History of GI bleeding    Grade II hemorrhoids 07/06/2016   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   Papillary thyroid  carcinoma (HCC) 05/10/2016   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 03/31/2014   Acute on chronic combined systolic and diastolic CHF (congestive heart failure) (HCC)    Obesity, Class III, BMI 40-49.9 (morbid obesity) (HCC)    Neck pain 03/05/2014   Hyperlipidemia with target LDL less than 100 12/16/2013   Hypothyroidism 12/16/2013   Hx of thyroid  cancer 12/16/2013   Chronic chest wall pain 10/11/2012   TIA (transient ischemic attack) 05/31/2012   Cardiomyopathy (HCC) 02/28/2012   CHF (congestive heart failure) (HCC) 02/28/2012   Depression 02/13/2012   HTN (hypertension) 02/06/2012   Presence of prosthetic heart valve 02/06/2012   Post-menopausal bleeding 12/05/2011   Fibroids 12/05/2011   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  She notes that Nothing is really limiting me now.  She thinks it was her ride side that was affected and that she has had some stuttering when she speaks.  Pt accompanied by: self and daughter Diondra  PERTINENT HISTORY:  PMHx: rheumatic heart valve disease s/p MVR and TAVR,  papillary thyroid  carcinoma follicular variant (s/p thyroidectomy), hypothyroidism, pulmonary HTN, valvular cardiomyopathy/HF, HLD, CKD, HTN, TIA, T2DM, history of GIB who was admitted to the [facility] Teaching Service at [facility] for right sided weakness.  CT negative for acute intracranial hemorrhage but revealed minimal hyperdensity seen in the left insular M2 concerning for possible thrombus, and encephalomalacia in the L parietal and occipital lobes. CTA demonstrated focal embolus within the L inferior division of M2 branch with diminished distal perfusion and tortuous common carotid arteries bilaterally.  Embolus concerned to be from rheumatic heart valvular disease. Abbreviated TEE performed due to patient becoming hypoxic likely secondary to bronchospasm/laryngospasm and requiring intubation and short ICU admission.   PRECAUTIONS: Fall  WEIGHT BEARING RESTRICTIONS: No  PAIN:  Are you having pain? No  FALLS: Has patient fallen in last 6 months? No  LIVING ENVIRONMENT: Lives with: lives with their daughter Lives in: apartment Stairs: No Has following equipment at home: None  PLOF: Independent - pt did not drive and was on disability (s/p heart surgery & thyroid  cancer)  PATIENT GOALS: Pt unable to identify anything specific but upon completion of eval, was in agreement to treatment to address observed weakness and lack of coordination  OBJECTIVE:  Note: Objective measures were completed at Evaluation unless otherwise noted.  HAND DOMINANCE: Right  ADLs: Overall ADLs: Independent - no concerns from daughter either Transfers/ambulation related to ADLs: Ind Tub Shower transfers: Ind without Camera Operator: none  IADLs: Shopping: Goes with daughter Light housekeeping: Lets her daughter do it Meal Prep: Daughter likes to [employer] mobility: Ind - does not use social research officer, government very often Medication management: Letting daughter do it for ie) sort into pill box Financial management:  Ind Handwriting:  NT - no concerns reported  MOBILITY STATUS: Independent  POSTURE COMMENTS:  rounded shoulders and forward head Sitting balance: WNL  ACTIVITY TOLERANCE: Activity tolerance: Similar to prior to stroke, learning to pace herself  FUNCTIONAL OUTCOME MEASURES: Quick Dash: 4.5  UPPER EXTREMITY ROM:    Active ROM Right eval Left eval  Shoulder flexion End range limitations End range limitations  Shoulder abduction    Shoulder adduction    Shoulder extension    Shoulder internal rotation    Shoulder external rotation    Elbow flexion    Elbow extension    Wrist flexion    Wrist extension    Wrist ulnar deviation    Wrist radial deviation    Wrist pronation    Wrist supination    (Blank rows = not tested)  UPPER EXTREMITY MMT:     MMT Right eval Left eval  Shoulder flexion 3+ 3+  Shoulder abduction    Shoulder adduction    Shoulder extension  Shoulder internal rotation    Shoulder external rotation    Middle trapezius    Lower trapezius    Elbow flexion    Elbow extension    Wrist flexion    Wrist extension    Wrist ulnar deviation    Wrist radial deviation    Wrist pronation    Wrist supination    (Blank rows = not tested)  HAND FUNCTION: Grip strength: Right: 26, 25, 27  lbs; Left: 30, 30, 30  lbs Average Right: 26 lbs; Left 30 lbs  COORDINATION: 9 Hole Peg test: Right: 54.38 sec; Left: 43.10 sec  SENSATION: Not tested  EDEMA: NA  MUSCLE TONE: WFL  COGNITION: Overall cognitive status:  Some trouble with stuttering when she gets talking too fast.  VISION: Subjective report: NA Baseline vision: Wears glasses for reading only Visual history:  NA  VISION ASSESSMENT: Not tested  Patient has difficulty with following activities due to following visual impairments: NA  PERCEPTION: Not tested  PRAXIS: Not tested  OBSERVATIONS: Pt ambulates with no AE and no loss of balance. The pt appears well kept and has t-  shirt/athletic pants and sneakers donned. Pt accompanied by her daughter.                                                                                                                           TODAY'S TREATMENT:   N/A - due to payor source but educated in beginning functional activities to increase UE strength and coordination through tasks like helping with daily chores etc  PATIENT EDUCATION: Education details: OT role and POC considerations Person educated: Patient and Child(ren) Education method: Explanation and Verbal cues Education comprehension: verbalized understanding and needs further education  HOME EXERCISE PROGRAM: TBD   GOALS: Goals reviewed with patient? Yes  LONG TERM GOALS: Target date: 07/28/23  Patient will demonstrate updated B UE HEP with visual handouts only for proper execution. Baseline: New to outpt OT  Goal status: INITIAL  2.  Patient will demonstrate at least 5 lb improvement in B UE grip strength as needed to open jars and other containers. Baseline: Right: 26 lbs; Left 30 lbs Goal status: INITIAL  3.  Patient will demo improved FM coordination as evidenced by completing nine-hole peg 5+ seconds faster with use of BUEs  Baseline: 9 Hole Peg test: Right: 54.38 sec; Left: 43.10 sec Goal status: INITIAL  4.  Pt will verbalize understanding of adapted strategies and/or equipment PRN to increase safety and independence with ADLs and IADLs. Baseline: New to outpt OT  Goal status: INITIAL  5. Pt will report increased ease and safety with participation in aspects of IADLs and leisure tasks. Baseline: daughter performs majority of home management etc  Goal status: INITIAL  ASSESSMENT:  CLINICAL IMPRESSION: Patient is a 63 y.o. female who was seen today for occupational therapy evaluation s/p cerebral infarction . Hx includes HTN, TIA, CHF. Patient currently presents below baseline level of function with B  UE strength and coordination. Pt would benefit  from skilled OT services in the outpatient setting to work on impairments as noted below to help pt return to PLOF as able.    PERFORMANCE DEFICITS: in functional skills including ADLs, IADLs, coordination, dexterity, ROM, strength, flexibility, Fine motor control, Gross motor control, balance, decreased knowledge of use of DME, and UE functional use, cognitive skills including attention, safety awareness, thought, and understand, and psychosocial skills including coping strategies and environmental adaptation.   IMPAIRMENTS: are limiting patient from ADLs, IADLs, and leisure.   CO-MORBIDITIES: has co-morbidities such as CHF, HTN, TIAs  that affects occupational performance. Patient will benefit from skilled OT to address above impairments and improve overall function.  MODIFICATION OR ASSISTANCE TO COMPLETE EVALUATION: No modification of tasks or assist necessary to complete an evaluation.  OT OCCUPATIONAL PROFILE AND HISTORY: Problem focused assessment: Including review of records relating to presenting problem.  CLINICAL DECISION MAKING: LOW - limited treatment options, no task modification necessary  REHAB POTENTIAL: Good  EVALUATION COMPLEXITY: Low    PLAN:  OT FREQUENCY: 1x/week  OT DURATION: up to 6 weeks - currently planned for 4 weeks  PLANNED INTERVENTIONS: 97535 self care/ADL training, 02889 therapeutic exercise, 97530 therapeutic activity, 97112 neuromuscular re-education, functional mobility training, energy conservation, coping strategies training, patient/family education, and DME and/or AE instructions  RECOMMENDED OTHER SERVICES: NA - Pt declined PT/ST services s/p evals today  CONSULTED AND AGREED WITH PLAN OF CARE: Patient and family member/caregiver  PLAN FOR NEXT SESSION:  HEPs - strength (putty/theraband), coordination Explore leisure pursuits Combine standing with FM tasks AE considerations   Clarita LITTIE Pride, OT 06/27/2023, 5:05 PM

## 2023-07-03 ENCOUNTER — Ambulatory Visit (HOSPITAL_COMMUNITY)
Admission: RE | Admit: 2023-07-03 | Discharge: 2023-07-03 | Disposition: A | Discharge status: Home / Self Care | Payer: Medicaid Other | Source: Ambulatory Visit | Attending: Cardiology | Admitting: Cardiology

## 2023-07-03 DIAGNOSIS — I5022 Chronic systolic (congestive) heart failure: Secondary | ICD-10-CM | POA: Diagnosis present

## 2023-07-03 LAB — BASIC METABOLIC PANEL WITH GFR
Anion gap: 13 (ref 5–15)
BUN: 13 mg/dL (ref 8–23)
CO2: 23 mmol/L (ref 22–32)
Calcium: 9.8 mg/dL (ref 8.9–10.3)
Chloride: 106 mmol/L (ref 98–111)
Creatinine, Ser: 1.18 mg/dL — ABNORMAL HIGH (ref 0.44–1.00)
GFR, Estimated: 53 mL/min — ABNORMAL LOW
Glucose, Bld: 97 mg/dL (ref 70–99)
Potassium: 3.7 mmol/L (ref 3.5–5.1)
Sodium: 142 mmol/L (ref 135–145)

## 2023-07-05 ENCOUNTER — Ambulatory Visit: Payer: Medicaid Other | Admitting: Occupational Therapy

## 2023-07-05 DIAGNOSIS — R278 Other lack of coordination: Secondary | ICD-10-CM

## 2023-07-05 DIAGNOSIS — M6281 Muscle weakness (generalized): Secondary | ICD-10-CM

## 2023-07-05 DIAGNOSIS — R29818 Other symptoms and signs involving the nervous system: Secondary | ICD-10-CM

## 2023-07-05 NOTE — Therapy (Signed)
OUTPATIENT OCCUPATIONAL THERAPY NEURO TREATMENT  Patient Name: [name] MRN: [id number] DOB:29-Jun-1960, 63 y.o., female Today's Date: 07/05/2023  PCP: Fortunato Curling, DO REFERRING PROVIDER: Latrelle Dodrill, MD  END OF SESSION:  OT End of Session - 07/05/23 1400     Visit Number 2    Number of Visits 5    Date for OT Re-Evaluation 07/28/23    Authorization Type [employer] Auth Reqd VL: 27    OT Start Time 1401    OT Stop Time 1445    OT Time Calculation (min) 44 min    Activity Tolerance Patient tolerated treatment well    Behavior During Therapy WFL for tasks assessed/performed             Past Medical History:  Diagnosis Date   Chronic combined systolic and diastolic CHF (congestive heart failure) (HCC)    EF now down to 25-30%. LVEDP on cath was 30 mmHg with wedge pressure of 31 mmHg.   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  mean PA 74/39 mean 52, mean PCWP 31 (with V wave of 45 mmHg), LVEDP ~ 30 mmHg.   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 Progressive worsening of aortic and mitral valve disease.   Past Surgical History:  Procedure Laterality Date   AORTIC VALVE REPAIR  August 2010   Suture plication of all 3 commissures   CARDIAC CATHETERIZATION  August 2010   Preop: nonobstructive coronary disease   CESAREAN SECTION  1981; 1996   MITRAL VALVE REPAIR  August 2010    26 mm Sorin [facility] 3D Ring Annuloplasty   RIGHT HEART CATH N/A 08/30/2016   Procedure: Right Heart Cath;  Surgeon: Laurey Morale, MD;  Location: [facility] INVASIVE CV LAB;  Service: Cardiovascular;  Laterality: N/A;   RIGHT/LEFT HEART CATH AND CORONARY ANGIOGRAPHY N/A 07/08/2016   Procedure: Right/Left Heart Cath and Coronary Angiography;  Surgeon: Marykay Lex, MD;  Location: [facility] INVASIVE CV LAB;  Service: Cardiovascular: Normal coronaries, mean RA 18, RV pressure/EDP: 70/12/18 mmHg.  mean PA 74/39 mean 52, mean PCWP 31 (with V wave of 45 mmHg),  LVEDP ~ 30 mmHg.  CI 1.98 Fick. Peak AoV gradient ~15 mHg     TEE WITHOUT CARDIOVERSION N/A 07/13/2016   Procedure: Transesophageal Echocardiogram (TEE);  Surgeon: Chrystie Nose, MD;  Location: [facility] ENDOSCOPY;  Service: Cardiovascular:  EF 25-30%, dilated LV - diffuse hypokinesis, rheumatic-appearing Aortic Vallve s/p repair with moderate AS (AVA 1.1 cm^2), moderate central AI.  MV s/p repeat with mild mitral stenosis and eccentric severe MR.     TEE WITHOUT CARDIOVERSION     TEE WITHOUT CARDIOVERSION N/A 06/28/2017   Procedure: TRANSESOPHAGEAL ECHOCARDIOGRAM (TEE);  Surgeon: Laurey Morale, MD;  Location: [facility] ENDOSCOPY;  Service: Cardiovascular;  Laterality: N/A;   TEE  WITHOUT CARDIOVERSION N/A 11/21/2017   Procedure: TRANSESOPHAGEAL ECHOCARDIOGRAM (TEE);  Surgeon: Tonny Bollman, MD;  Location: [facility] OR;  Service: Open Heart Surgery;  Laterality: N/A;   THYROID LOBECTOMY Left 09/17/2009   which showed a 3.5 cm follicular variant papillary cancer   THYROIDECTOMY     "2nd OR on my thyroid they took out the whole thing"   TRANSCATHETER AORTIC VALVE REPLACEMENT, TRANSFEMORAL  11/21/2017   Transcatheter Aortic Valve Replacement - Percutaneous Right Transfemoral Approach   TRANSCATHETER AORTIC VALVE REPLACEMENT, TRANSFEMORAL N/A 11/21/2017   Procedure: TRANSCATHETER AORTIC VALVE REPLACEMENT, TRANSFEMORAL using a 23mm Edwards Sapien 3 Aortic Valve;  Surgeon: Tonny Bollman, MD;  Location: [facility] OR;  Service: Open Heart Surgery;  Laterality: N/A;   TRANSESOPHAGEAL ECHOCARDIOGRAM (CATH LAB) N/A 06/12/2023   Procedure: TRANSESOPHAGEAL ECHOCARDIOGRAM;  Surgeon: Little Ishikawa, MD;  Location: [facility] INVASIVE CV LAB;  Service: Cardiovascular;  Laterality: N/A;   TRANSTHORACIC ECHOCARDIOGRAM  05/2016   EF 25-30%. Moderate aortic stenosis with moderate regurgitation. Also potential aortic stenosis with likely worse than expected regurgitation   TRANSTHORACIC ECHOCARDIOGRAM  May 2012    EF 40-45%, inferior hypokinesis. Slightly increased transaortic velocity = mild AS with mild to moderate AI normal MV gradients with no MR. Significantly improved PA pressures. Paradoxical septal motion.   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/27/2016   Pulmonary hypertension (HCC)    Valvular cardiomyopathy (HCC)    History of GI bleeding    Grade II hemorrhoids 07/06/2016   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   Papillary thyroid carcinoma (HCC) 05/10/2016   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 03/31/2014   Acute on chronic combined systolic and diastolic CHF (congestive heart failure) (HCC)    Obesity, Class III, BMI 40-49.9 (morbid obesity) (HCC)    Neck pain 03/05/2014   Hyperlipidemia with target LDL less than 100 12/16/2013   Hypothyroidism 12/16/2013   Hx of thyroid cancer 12/16/2013   Chronic chest wall pain 10/11/2012   TIA (transient ischemic attack) 05/31/2012   Cardiomyopathy (HCC) 02/28/2012   CHF (congestive heart failure) (HCC) 02/28/2012   Depression 02/13/2012   HTN (hypertension) 02/06/2012   Presence of prosthetic heart valve 02/06/2012   Post-menopausal bleeding 12/05/2011   Fibroids 12/05/2011   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   S/P aortic valve repair 12/23/2008   S/P MVR (mitral valve repair) 12/23/2008  Other specified postprocedural states 12/23/2008    ONSET DATE: Referral: 06/16/2023 Hospitalization 06/09/23 - 06/17/23  REFERRING DIAG: I63.10 (ICD-10-CM) - 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  Pt reports she made chicken and rice as well as baked pork chops even managing a glass pan in and out of the oven.  Pt accompanied by: self and daughter Diondra  PERTINENT HISTORY:  PMHx: rheumatic heart valve disease s/p MVR and TAVR, papillary thyroid carcinoma  follicular variant (s/p thyroidectomy), hypothyroidism, pulmonary HTN, valvular cardiomyopathy/HF, HLD, CKD, HTN, TIA, T2DM, history of GIB who was admitted to the [facility] Teaching [facility] at [facility] for right sided weakness.  CT negative for acute intracranial hemorrhage but revealed minimal hyperdensity seen in the left insular M2 concerning for possible thrombus, and encephalomalacia in the L parietal and occipital lobes. CTA demonstrated focal embolus within the L inferior division of M2 branch with diminished distal perfusion and tortuous common carotid arteries bilaterally.  Embolus concerned to be from rheumatic heart valvular disease. Abbreviated TEE performed due to patient becoming hypoxic likely secondary to bronchospasm/laryngospasm and requiring intubation and short ICU admission.   PRECAUTIONS: Fall  WEIGHT BEARING RESTRICTIONS: No  PAIN:  Are you having pain? No  FALLS: Has patient fallen in last 6 months? No  LIVING ENVIRONMENT: Lives with: lives with their daughter Lives in: apartment Stairs: No Has following equipment at home: None  PLOF: Independent - pt did not drive and was on disability (s/p heart surgery & thyroid cancer)  PATIENT GOALS: Pt unable to identify anything specific but upon completion of eval, was in agreement to treatment to address observed weakness and lack of coordination  OBJECTIVE:  Note: Objective measures were completed at Evaluation unless otherwise noted.  HAND DOMINANCE: Right  ADLs: Overall ADLs: Independent - no concerns from daughter either Transfers/ambulation related to ADLs: Ind Tub Shower transfers: Ind without Camera operator: none  IADLs: Shopping: Goes with daughter Light housekeeping: Lets her daughter do it Meal Prep: Daughter likes to [employer] mobility: Ind - does not use Social research officer, government very often Medication management: Letting daughter do it for ie) sort into pill box Financial management: Ind Handwriting:  NT - no  concerns reported  MOBILITY STATUS: Independent  POSTURE COMMENTS:  rounded shoulders and forward head Sitting balance: WNL  ACTIVITY TOLERANCE: Activity tolerance: Similar to prior to stroke, learning to pace herself  FUNCTIONAL OUTCOME MEASURES: Quick Dash: 4.5  UPPER EXTREMITY ROM:    Active ROM Right eval Left eval  Shoulder flexion End range limitations End range limitations  Shoulder abduction    Shoulder adduction    Shoulder extension    Shoulder internal rotation    Shoulder external rotation    Elbow flexion    Elbow extension    Wrist flexion    Wrist extension    Wrist ulnar deviation    Wrist radial deviation    Wrist pronation    Wrist supination    (Blank rows = not tested)  UPPER EXTREMITY MMT:     MMT Right eval Left eval  Shoulder flexion 3+ 3+  Shoulder abduction    Shoulder adduction    Shoulder  extension    Shoulder internal rotation    Shoulder external rotation    Middle trapezius    Lower trapezius    Elbow flexion    Elbow extension    Wrist flexion    Wrist extension    Wrist ulnar deviation    Wrist radial deviation    Wrist pronation    Wrist supination    (Blank rows = not tested)  HAND FUNCTION: Grip strength: Right: 26, 25, 27  lbs; Left: 30, 30, 30  lbs Average Right: 26 lbs; Left 30 lbs  COORDINATION: 9 Hole Peg test: Right: 54.38 sec; Left: 43.10 sec  SENSATION: Not tested  EDEMA: NA  MUSCLE TONE: WFL  COGNITION: Overall cognitive status:  Some trouble with stuttering when she gets talking too fast.  VISION: Subjective report: NA Baseline vision: Wears glasses for reading only Visual history:  NA  VISION ASSESSMENT: Not tested  Patient has difficulty with following activities due to following visual impairments: NA  PERCEPTION: Not tested  PRAXIS: Not tested  OBSERVATIONS: Pt ambulates with no AE and no loss of balance. The pt appears well kept and has t- shirt/athletic pants and sneakers  donned. Pt accompanied by her daughter.                                                                                                                           TODAY'S TREATMENT:   Initiated Putty Exercises with pink putty to begin strengthening, coordination and sensory stimulation of B UEs.  Patient provided visual demonstration, verbal and tactile cues as needed to improve performance of the various exercises/activities including:   - Putty Squeezes - cues to squeeze putty into log for use with other exercises and to fold putty in half with 1 hand with some coordination difficulties with R hand for this task  - Putty Rolls - encourage to roll putty into logs with sensory stimulation to entire length of hand, fingers and wrist as needed   - Pinch and Pull with Putty - this motion is combined with different pinches (3-Point Pinch, Tip Pinch, Key Pinch) - patient encouraged to combine tripod, pincer and/or key pinch with "pinch and pull" motion of putty pulling away from midline, changing between different pinches and changing between pinches with cues to make and O with her finger and thumb for tip pinch to improve isolated pad to pad pinch   -Finger Extension with Putty - pt shown how to work on task with all fingers and thumb as well as individual fingers in opposition to thumb  -Finger Adduction with Putty - pt shown how to weave a putty strip between fingers and then squeeze fingers together to pinch putty between digits  - Removing Objects from Putty  - encouraged to hide items (coins, marble, dice etc) and use 1-2 hands to find the objects and digs them out of the putty  OT educated patient on theraputty recommendations: avoid hot environments, place in designated  container, avoid contact with fabrics. Patient verbalized understanding.    Patient benefited from extra time, verbal/tactile cues, and modeling of task to allow time for processing of verbal instructions and improve motor  planning of unfamiliar movements. In addition, recommendations made on how to improve ease of task ie) twisting putty to get smaller amount and limiting activity to 10-15 minutes to avoid tiring.  PATIENT EDUCATION: Education details: Putty Exercises Person educated: Patient and Child(ren) Education method: Explanation and Verbal cues Education comprehension: verbalized understanding and needs further education  HOME EXERCISE PROGRAM: 07/05/23: Putty Activities - Access Code: [email or web address]  GOALS: Goals reviewed with patient? Yes  LONG TERM GOALS: Target date: 07/28/23  Patient will demonstrate updated B UE HEP with visual handouts only for proper execution. Baseline: New to outpt OT  Goal status: IN Progress  2.  Patient will demonstrate at least 5 lb improvement in B UE grip strength as needed to open jars and other containers. Baseline: Right: 26 lbs; Left 30 lbs Goal status: IN Progress  3.  Patient will demo improved FM coordination as evidenced by completing nine-hole peg 5+ seconds faster with use of BUEs  Baseline: 9 Hole Peg test: Right: 54.38 sec; Left: 43.10 sec Goal status: IN Progress  4.  Pt will verbalize understanding of adapted strategies and/or equipment PRN to increase safety and independence with ADLs and IADLs. Baseline: New to outpt OT  Goal status: INITIAL  5. Pt will report increased ease and safety with participation in aspects of IADLs and leisure tasks. Baseline: daughter performs majority of home management etc  Goal status: INITIAL  ASSESSMENT:  CLINICAL IMPRESSION: Patient is a 63 y.o. female who was seen today for occupational therapy treatment s/p cerebral infarction.  Patient working well of functional activities at home and responded well to HEP ideas with putty today.  Patient currently presents below baseline level of function with B UE strength and coordination and will benefit from continued skilled OT services in the outpatient setting to work on  impairments as noted below to help pt return to PLOF as able.    PERFORMANCE DEFICITS: in functional skills including ADLs, IADLs, coordination, dexterity, ROM, strength, flexibility, Fine motor control, Gross motor control, balance, decreased knowledge of use of DME, and UE functional use, cognitive skills including attention, safety awareness, thought, and understand, and psychosocial skills including coping strategies and environmental adaptation.   IMPAIRMENTS: are limiting patient from ADLs, IADLs, and leisure.   CO-MORBIDITIES: has co-morbidities such as CHF, HTN, TIAs  that affects occupational performance. Patient will benefit from skilled OT to address above impairments and improve overall function.  REHAB POTENTIAL: Good  PLAN:  OT FREQUENCY: 1x/week  OT DURATION: up to 6 weeks - currently planned for 4 weeks  PLANNED INTERVENTIONS: 97535 self care/ADL training, 09811 therapeutic exercise, 97530 therapeutic activity, 97112 neuromuscular re-education, functional mobility training, energy conservation, coping strategies training, patient/family education, and DME and/or AE instructions  RECOMMENDED OTHER SERVICES: NA - Pt declined PT/ST services s/p evals today  CONSULTED AND AGREED WITH PLAN OF CARE: Patient and family member/caregiver  PLAN FOR NEXT SESSION:  HEPs - review strength (putty/theraband), ADD coordination Explore leisure pursuits Combine standing with FM tasks AE considerations   Victorino Sparrow, OT 07/05/2023, 4:25 PM

## 2023-07-05 NOTE — Patient Instructions (Signed)
Access Code: [facility] URL: [email or web address] Date: 07/05/2023 Prepared by: Amada Kingfisher  Exercises - Putty Squeezes  - 1-2 x daily - 10 reps - Rolling Putty on Table  - 1-2 x daily - 10 reps - Finger Pinch and Pull with Putty  - 1-2 x daily - 10 reps - 3-Point Pinch with Putty  - 1-2 x daily - 10 reps - Tip PUSH with Putty  - 1-2 x daily - 10 reps - Key Pinch with Putty  - 1-2 x daily - 10 reps - Finger Extension with Putty  - 1-2 x daily - 10 reps - Finger Adduction with Putty  - 1-2 x daily - 10 reps - Removing Marbles from Putty  - 1-2 x daily - 10 reps

## 2023-07-11 ENCOUNTER — Other Ambulatory Visit: Payer: Self-pay

## 2023-07-11 MED ORDER — METOPROLOL SUCCINATE ER 25 MG PO TB24: 25.0000 mg | ORAL_TABLET | Freq: Every day | ORAL | 0 refills | Status: DC

## 2023-07-12 ENCOUNTER — Ambulatory Visit: Payer: Medicaid Other | Admitting: Occupational Therapy

## 2023-07-16 ENCOUNTER — Other Ambulatory Visit: Payer: Self-pay | Admitting: Family Medicine

## 2023-07-18 ENCOUNTER — Other Ambulatory Visit: Payer: Self-pay

## 2023-07-19 ENCOUNTER — Ambulatory Visit: Payer: Medicaid Other | Admitting: Occupational Therapy

## 2023-07-19 DIAGNOSIS — R278 Other lack of coordination: Secondary | ICD-10-CM

## 2023-07-19 DIAGNOSIS — M6281 Muscle weakness (generalized): Secondary | ICD-10-CM

## 2023-07-19 MED ORDER — DABIGATRAN ETEXILATE MESYLATE 150 MG PO CAPS: 150.0000 mg | ORAL_CAPSULE | Freq: Two times a day (BID) | ORAL | 3 refills | Status: DC

## 2023-07-19 NOTE — Therapy (Unsigned)
 OUTPATIENT OCCUPATIONAL THERAPY NEURO TREATMENT  Patient Name: [name] MRN: [id number] DOB:Apr 16, 1961, 63 y.o., female Today's Date: 07/19/2023  PCP: Fortunato Curling, DO REFERRING PROVIDER: Latrelle Dodrill, MD  END OF SESSION:  OT End of Session - 07/19/23 1405     Visit Number 3    Number of Visits 5    Date for OT Re-Evaluation 07/28/23    Authorization Type [employer] Auth Reqd VL: 27    OT Start Time 1403    OT Stop Time 1445    OT Time Calculation (min) 42 min    Equipment Utilized During Treatment Fluidotherapy    Activity Tolerance Patient tolerated treatment well    Behavior During Therapy WFL for tasks assessed/performed             Past Medical History:  Diagnosis Date   Chronic combined systolic and diastolic CHF (congestive heart failure) (HCC)    EF now down to 25-30%. LVEDP on cath was 30 mmHg with wedge pressure of 31 mmHg.   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  mean PA 74/39 mean 52, mean PCWP 31 (with V wave of 45 mmHg), LVEDP ~ 30 mmHg.   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 Progressive worsening of aortic and mitral valve disease.   Past Surgical History:  Procedure Laterality Date   AORTIC VALVE REPAIR  August 2010   Suture plication of all 3 commissures   CARDIAC CATHETERIZATION  August 2010   Preop: nonobstructive coronary disease   CESAREAN SECTION  1981; 1996   MITRAL VALVE REPAIR  August 2010    26 mm Sorin [facility] 3D Ring Annuloplasty   RIGHT HEART CATH N/A 08/30/2016   Procedure: Right Heart Cath;  Surgeon: Laurey Morale, MD;  Location: [facility] INVASIVE CV LAB;  Service: Cardiovascular;  Laterality: N/A;   RIGHT/LEFT HEART CATH AND CORONARY ANGIOGRAPHY N/A 07/08/2016   Procedure: Right/Left Heart Cath and Coronary Angiography;  Surgeon: Marykay Lex, MD;  Location: [facility] INVASIVE CV LAB;  Service: Cardiovascular: Normal coronaries, mean RA 18, RV pressure/EDP: 70/12/18 mmHg.  mean PA 74/39 mean 52, mean  PCWP 31 (with V wave of 45 mmHg), LVEDP ~ 30 mmHg.  CI 1.98 Fick. Peak AoV gradient ~15 mHg     TEE WITHOUT CARDIOVERSION N/A 07/13/2016   Procedure: Transesophageal Echocardiogram (TEE);  Surgeon: Chrystie Nose, MD;  Location: [facility] ENDOSCOPY;  Service: Cardiovascular:  EF 25-30%, dilated LV - diffuse hypokinesis, rheumatic-appearing Aortic Vallve s/p repair with moderate AS (AVA 1.1 cm^2), moderate central AI.  MV s/p repeat with mild mitral stenosis and eccentric severe MR.     TEE WITHOUT CARDIOVERSION     TEE WITHOUT CARDIOVERSION N/A 06/28/2017   Procedure: TRANSESOPHAGEAL ECHOCARDIOGRAM (TEE);  Surgeon: Laurey Morale, MD;  Location: [facility]  ENDOSCOPY;  Service: Cardiovascular;  Laterality: N/A;   TEE WITHOUT CARDIOVERSION N/A 11/21/2017   Procedure: TRANSESOPHAGEAL ECHOCARDIOGRAM (TEE);  Surgeon: Tonny Bollman, MD;  Location: [facility] OR;  Service: Open Heart Surgery;  Laterality: N/A;   THYROID LOBECTOMY Left 09/17/2009   which showed a 3.5 cm follicular variant papillary cancer   THYROIDECTOMY     "2nd OR on my thyroid they took out the whole thing"   TRANSCATHETER AORTIC VALVE REPLACEMENT, TRANSFEMORAL  11/21/2017   Transcatheter Aortic Valve Replacement - Percutaneous Right Transfemoral Approach   TRANSCATHETER AORTIC VALVE REPLACEMENT, TRANSFEMORAL N/A 11/21/2017   Procedure: TRANSCATHETER AORTIC VALVE REPLACEMENT, TRANSFEMORAL using a 23mm Edwards Sapien 3 Aortic Valve;  Surgeon: Tonny Bollman, MD;  Location: [facility] OR;  Service: Open Heart Surgery;  Laterality: N/A;   TRANSESOPHAGEAL ECHOCARDIOGRAM (CATH LAB) N/A 06/12/2023   Procedure: TRANSESOPHAGEAL ECHOCARDIOGRAM;  Surgeon: Little Ishikawa, MD;  Location: [facility] INVASIVE CV LAB;  Service: Cardiovascular;  Laterality: N/A;   TRANSTHORACIC ECHOCARDIOGRAM  05/2016   EF 25-30%. Moderate aortic stenosis with moderate regurgitation. Also potential aortic stenosis with likely worse than expected regurgitation   TRANSTHORACIC ECHOCARDIOGRAM  May 2012    EF 40-45%, inferior hypokinesis. Slightly increased transaortic velocity = mild AS with mild to moderate AI normal MV gradients with no MR. Significantly improved PA pressures. Paradoxical septal motion.   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   Pulmonary hypertension (HCC)    Valvular cardiomyopathy (HCC)    History of GI bleeding    Grade II hemorrhoids 07/06/2016   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   Papillary thyroid carcinoma (HCC) 05/10/2016   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 03/31/2014   Acute on chronic combined systolic and diastolic CHF (congestive heart failure) (HCC)    Obesity, Class III, BMI 40-49.9 (morbid obesity) (HCC)    Neck pain 03/05/2014   Hyperlipidemia with target LDL less than 100 12/16/2013   Hypothyroidism 12/16/2013   Hx of thyroid cancer 12/16/2013   Chronic chest wall pain 10/11/2012   TIA (transient ischemic attack) 05/31/2012   Cardiomyopathy (HCC) 02/28/2012   CHF (congestive heart failure) (HCC) 02/28/2012   Depression 02/13/2012   HTN (hypertension) 02/06/2012   Presence of prosthetic heart valve 02/06/2012   Post-menopausal bleeding 12/05/2011   Fibroids 12/05/2011   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  She reports that her R hand has been stiff especially in the ring finger with some stiffness.  Pt reports things are going good, using my hands lots and daughter reports she has been cooking.   Pt accompanied by: self and daughter Diondra  PERTINENT HISTORY:  PMHx: rheumatic heart valve disease s/p MVR and TAVR, papillary thyroid carcinoma  follicular variant (s/p thyroidectomy), hypothyroidism, pulmonary HTN, valvular cardiomyopathy/HF, HLD, CKD, HTN, TIA, T2DM, history of GIB who was admitted to the [facility] at [facility] for right sided weakness.  CT negative for acute intracranial hemorrhage but revealed minimal hyperdensity seen in the left insular M2 concerning for possible thrombus, and encephalomalacia in the L parietal and occipital lobes. CTA demonstrated focal embolus within the L inferior division of M2 branch with diminished distal perfusion and tortuous common carotid arteries bilaterally.  Embolus concerned to be from rheumatic heart valvular disease. Abbreviated TEE performed due to patient becoming hypoxic likely secondary to bronchospasm/laryngospasm and requiring intubation and short ICU admission.   PRECAUTIONS: Fall  WEIGHT BEARING RESTRICTIONS: No  PAIN:  Are you having pain? No  FALLS: Has patient fallen in last 6 months? No  LIVING ENVIRONMENT: Lives with: lives with their daughter Lives in: apartment Stairs: No Has following equipment at home: None  PLOF: Independent - pt did not drive and was on disability (s/p heart surgery & thyroid cancer)  PATIENT GOALS: Pt unable to identify anything specific but upon completion of eval, was in agreement to treatment to address observed weakness and lack of coordination  OBJECTIVE:  Note: Objective measures were completed at Evaluation unless otherwise noted.  HAND DOMINANCE: Right  ADLs: Overall ADLs: Independent - no concerns from daughter either Transfers/ambulation related to ADLs: Ind Tub Shower transfers: Ind without Camera operator: none  IADLs: Shopping: Goes with daughter Light housekeeping: Lets her daughter do it Meal Prep: Daughter likes to [employer] mobility: Ind - does not use Social research officer, government very often Medication management: Letting daughter do it for ie) sort into pill box Financial management: Ind Handwriting:  NT - no  concerns reported  MOBILITY STATUS: Independent  POSTURE COMMENTS:  rounded shoulders and forward head Sitting balance: WNL  ACTIVITY TOLERANCE: Activity tolerance: Similar to prior to stroke, learning to pace herself  FUNCTIONAL OUTCOME MEASURES: Quick Dash: 4.5  UPPER EXTREMITY ROM:    Active ROM Right eval Left eval  Shoulder flexion End range limitations End range limitations  Shoulder abduction    Shoulder adduction    Shoulder extension    Shoulder internal rotation    Shoulder external rotation    Elbow flexion    Elbow extension    Wrist flexion    Wrist extension    Wrist ulnar deviation    Wrist radial deviation    Wrist pronation    Wrist supination    (Blank rows = not tested)  UPPER EXTREMITY MMT:     MMT Right eval Left eval  Shoulder flexion 3+ 3+  Shoulder abduction    Shoulder adduction    Shoulder extension    Shoulder  internal rotation    Shoulder external rotation    Middle trapezius    Lower trapezius    Elbow flexion    Elbow extension    Wrist flexion    Wrist extension    Wrist ulnar deviation    Wrist radial deviation    Wrist pronation    Wrist supination    (Blank rows = not tested)  HAND FUNCTION: Grip strength: Right: 26, 25, 27  lbs; Left: 30, 30, 30  lbs Average Right: 26 lbs; Left 30 lbs  COORDINATION: 9 Hole Peg test: Right: 54.38 sec; Left: 43.10 sec  SENSATION: Not tested  EDEMA: NA  MUSCLE TONE: WFL  COGNITION: Overall cognitive status:  Some trouble with stuttering when she gets talking too fast.  VISION: Subjective report: NA Baseline vision: Wears glasses for reading only Visual history:  NA  VISION ASSESSMENT: Not tested  Patient has difficulty with following activities due to following visual impairments: NA  PERCEPTION: Not tested  PRAXIS: Not tested  OBSERVATIONS: Pt ambulates with no AE and no loss of balance. The pt appears well kept and has t- shirt/athletic pants and sneakers  donned. Pt accompanied by her daughter.                                                                                                                           TODAY'S TREATMENT:    Pt placed ***UE in Fluidotherapy machine with supervised ROM x 10 min. Pt was educated to complete *** PROM during modality time to improve ROM and decrease pain/stiffness of affected extremity by use of the machine's massaging action and thermal properties.    Coordination Exercise/Activity handout with images provided for various activities to work on B UE finger ROM, dexterity and isolated movements with demonstration and practice, as well as modification, hand over hand guidance and cues throughout to improve technique, digital isolation and ease of performing task.  Tasks included:  Pick up coins, dominoes, buttons, marbles, dried beans/pasta of different sizes ... To place in containers To stack - with guidance to work on include/isolate specific fingers. To pick up items one at a time until patient got 5+ in their hand and then move item from palm to fingertips to release ie) Finger-to-palm then palm-to-finger translation of small items - Options to vary difficulty include using a washcloth under items like coins or using larger items (checkers vs coins or blocks/dominos vs dice) for increased ease of picking up items.  Shuffling, Flipping and dealing cards 1 at a time. -- Setup patient to work on sorting cards, focusing on using index finger with thumb to flip cards or holding deck of cards in palm of hand and push off 1 card at a time from the top of the deck using only thumb    Rotate golf balls (clockwise and counter-clockwise) with forearm pronated and balls on table or supinated and balls in hand.   Twirl pen/cil between fingers. - Encouragement to  isolate fingers individually and "twirl" (rotation) or flipping and shift up and down the pen (translation) to get it in position for writing or erasing.     Tear a piece of paper towel and roll it into small balls with fingertips ie) straw wrapping when eating out.    Patient is encouraged to take breaks, relax arm/shoulder by supporting forearm, minimize compensatory motions and a try different activities throughout the day/week including games like Dorisann Frames (for the dice), card games, Connect 4 etc.   Patient benefited from extra time, verbal/tactile cues, and modeling of task to allow time for processing of verbal instructions and improve motor planning of unfamiliar movements.    PATIENT EDUCATION: Education details: Putty Exercises Person educated: Patient Education method: Explanation and Verbal cues Education comprehension: verbalized understanding and needs further education  HOME EXERCISE PROGRAM: 07/05/23: Putty Activities - Access Code: [email or web address]  GOALS: Goals reviewed with patient? Yes  LONG TERM GOALS: Target date: 07/28/23  Patient will demonstrate updated B UE HEP with visual handouts only for proper execution. Baseline: New to outpt OT  Goal status: IN Progress  2.  Patient will demonstrate at least 5 lb improvement in B UE grip strength as needed to open jars and other containers. Baseline: Right: 26 lbs; Left 30 lbs Goal status: IN Progress  3.  Patient will demo improved FM coordination as evidenced by completing nine-hole peg 5+ seconds faster with use of BUEs  Baseline: 9 Hole Peg test: Right: 54.38 sec; Left: 43.10 sec Goal status: IN Progress  4.  Pt will verbalize understanding of adapted strategies and/or equipment PRN to increase safety and independence with ADLs and IADLs. Baseline: New to outpt OT  Goal status: INITIAL  5. Pt will report increased ease and safety with participation in aspects of IADLs and leisure tasks. Baseline: daughter performs majority of home management etc  Goal status: INITIAL  ASSESSMENT:  CLINICAL IMPRESSION: Patient is a 63 y.o. female who was seen today for occupational  therapy treatment s/p cerebral infarction.  Patient working well of functional activities at home and responded well to HEP ideas with putty today.  Patient currently presents below baseline level of function with B UE strength and coordination and will benefit from continued skilled OT services in the outpatient setting to work on impairments as noted below to help pt return to PLOF as able.    PERFORMANCE DEFICITS: in functional skills including ADLs, IADLs, coordination, dexterity, ROM, strength, flexibility, Fine motor control, Gross motor control, balance, decreased knowledge of use of DME, and UE functional use, cognitive skills including attention, safety awareness, thought, and understand, and psychosocial skills including coping strategies and environmental adaptation.   IMPAIRMENTS: are limiting patient from ADLs, IADLs, and leisure.   CO-MORBIDITIES: has co-morbidities such as CHF, HTN, TIAs  that affects occupational performance. Patient will benefit from skilled OT to address above impairments and improve overall function.  REHAB POTENTIAL: Good  PLAN:  OT FREQUENCY: 1x/week  OT DURATION: up to 6 weeks - currently planned for 4 weeks  PLANNED INTERVENTIONS: 97535 self care/ADL training, 16109 therapeutic exercise, 97530 therapeutic activity, 97112 neuromuscular re-education, functional mobility training, energy conservation, coping strategies training, patient/family education, and DME and/or AE instructions  RECOMMENDED OTHER SERVICES: NA - Pt declined PT/ST services s/p evals today  CONSULTED AND AGREED WITH PLAN OF CARE: Patient and family member/caregiver  PLAN FOR NEXT SESSION:  HEPs - review strength (putty/theraband), ADD coordination Explore leisure pursuits Combine standing with FM tasks AE considerations  Victorino Sparrow, OT 07/19/2023, 5:51 PM

## 2023-07-26 ENCOUNTER — Ambulatory Visit: Payer: Self-pay | Attending: Family Medicine | Admitting: Occupational Therapy

## 2023-07-26 DIAGNOSIS — R278 Other lack of coordination: Secondary | ICD-10-CM | POA: Diagnosis present

## 2023-07-26 DIAGNOSIS — R208 Other disturbances of skin sensation: Secondary | ICD-10-CM | POA: Diagnosis present

## 2023-07-26 DIAGNOSIS — M6281 Muscle weakness (generalized): Secondary | ICD-10-CM | POA: Insufficient documentation

## 2023-07-26 DIAGNOSIS — R29818 Other symptoms and signs involving the nervous system: Secondary | ICD-10-CM | POA: Insufficient documentation

## 2023-07-26 NOTE — Therapy (Unsigned)
 OUTPATIENT OCCUPATIONAL THERAPY NEURO TREATMENT  Patient Name: [name] MRN: [id number] DOB:[birth year], 63 y.o., female Today's Date: 07/26/2023  PCP: Fortunato Curling, DO REFERRING PROVIDER: Latrelle Dodrill, MD  END OF SESSION:  OT End of Session - 07/26/23 1405     Visit Number 4    Number of Visits 5    Date for OT Re-Evaluation 07/28/23    Authorization Type [employer] Auth Reqd VL: 27    OT Start Time 1405    OT Stop Time 1445    OT Time Calculation (min) 40 min    Equipment Utilized During Treatment FM objects, testing material    Activity Tolerance Patient tolerated treatment well    Behavior During Therapy WFL for tasks assessed/performed             Past Medical History:  Diagnosis Date   Chronic combined systolic and diastolic CHF (congestive heart failure) (HCC)    EF now down to 25-30%. LVEDP on cath was 30 mmHg with wedge pressure of 31 mmHg.   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  mean PA 74/39 mean 52, mean PCWP 31 (with V wave of 45 mmHg), LVEDP ~ 30 mmHg.   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 Progressive worsening of aortic and mitral valve disease.   Past Surgical History:  Procedure Laterality Date   AORTIC VALVE REPAIR  August 2010   Suture plication of all 3 commissures   CARDIAC CATHETERIZATION  August 2010   Preop: nonobstructive coronary disease   CESAREAN SECTION  1981; 1996   MITRAL VALVE REPAIR  August 2010    26 mm Sorin [facility] 3D Ring Annuloplasty   RIGHT HEART CATH N/A 08/30/2016   Procedure: Right Heart Cath;  Surgeon: Laurey Morale, MD;  Location: [facility] INVASIVE CV LAB;  Service: Cardiovascular;  Laterality: N/A;   RIGHT/LEFT HEART CATH AND CORONARY ANGIOGRAPHY N/A 07/08/2016   Procedure: Right/Left Heart Cath and Coronary Angiography;  Surgeon: Marykay Lex, MD;  Location: [facility] INVASIVE CV LAB;  Service: Cardiovascular: Normal coronaries, mean RA 18, RV pressure/EDP: 70/12/18 mmHg.  mean PA 74/39  mean 52, mean PCWP 31 (with V wave of 45 mmHg), LVEDP ~ 30 mmHg.  CI 1.98 Fick. Peak AoV gradient ~15 mHg     TEE WITHOUT CARDIOVERSION N/A 07/13/2016   Procedure: Transesophageal Echocardiogram (TEE);  Surgeon: Chrystie Nose, MD;  Location: [facility] ENDOSCOPY;  Service: Cardiovascular:  EF 25-30%, dilated LV - diffuse hypokinesis, rheumatic-appearing Aortic Vallve s/p repair with moderate AS (AVA 1.1 cm^2), moderate central AI.  MV s/p repeat with mild mitral stenosis and eccentric severe MR.     TEE WITHOUT CARDIOVERSION     TEE WITHOUT CARDIOVERSION N/A 06/28/2017   Procedure: TRANSESOPHAGEAL ECHOCARDIOGRAM (TEE);  Surgeon: Laurey Morale, MD;   Location: [facility] ENDOSCOPY;  Service: Cardiovascular;  Laterality: N/A;   TEE WITHOUT CARDIOVERSION N/A 11/21/2017   Procedure: TRANSESOPHAGEAL ECHOCARDIOGRAM (TEE);  Surgeon: Tonny Bollman, MD;  Location[facility] OR;  Service: Open Heart Surgery;  Laterality: N/A;   THYROID LOBECTOMY Left 09/17/2009   which showed a 3.5 cm follicular variant papillary cancer   THYROIDECTOMY     "2nd OR on my thyroid they took out the whole thing"   TRANSCATHETER AORTIC VALVE REPLACEMENT, TRANSFEMORAL  11/21/2017   Transcatheter Aortic Valve Replacement - Percutaneous Right Transfemoral Approach   TRANSCATHETER AORTIC VALVE REPLACEMENT, TRANSFEMORAL N/A 11/21/2017   Procedure: TRANSCATHETER AORTIC VALVE REPLACEMENT, TRANSFEMORAL using a 23mm Edwards Sapien 3 Aortic Valve;  Surgeon: Tonny Bollman, MD;  Location: [facility] OR;  Service: Open Heart Surgery;  Laterality: N/A;   TRANSESOPHAGEAL ECHOCARDIOGRAM (CATH LAB) N/A 06/12/2023   Procedure: TRANSESOPHAGEAL ECHOCARDIOGRAM;  Surgeon: Little Ishikawa, MD;  Location: [facility] INVASIVE CV LAB;  Service: Cardiovascular;  Laterality: N/A;   TRANSTHORACIC ECHOCARDIOGRAM  05/2016   EF 25-30%. Moderate aortic stenosis with moderate regurgitation. Also potential aortic stenosis with likely worse than expected regurgitation   TRANSTHORACIC ECHOCARDIOGRAM  May 2012    EF 40-45%, inferior hypokinesis. Slightly increased transaortic velocity = mild AS with mild to moderate AI normal MV gradients with no MR. Significantly improved PA pressures. Paradoxical septal motion.   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   Pulmonary hypertension (HCC)    Valvular cardiomyopathy (HCC)    History of GI bleeding    Grade II hemorrhoids 07/06/2016   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   Papillary thyroid carcinoma (HCC) 05/10/2016   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 03/31/2014   Acute on chronic combined systolic and diastolic CHF (congestive heart failure) (HCC)    Obesity, Class III, BMI 40-49.9 (morbid obesity) (HCC)    Neck pain 03/05/2014   Hyperlipidemia with target LDL less than 100 12/16/2013   Hypothyroidism 12/16/2013   Hx of thyroid cancer 12/16/2013   Chronic chest wall pain 10/11/2012   TIA (transient ischemic attack) 05/31/2012   Cardiomyopathy (HCC) 02/28/2012   CHF (congestive heart failure) (HCC) 02/28/2012   Depression 02/13/2012   HTN (hypertension) 02/06/2012   Presence of prosthetic heart valve 02/06/2012   Post-menopausal bleeding 12/05/2011   Fibroids 12/05/2011   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   S/P aortic valve  repair 12/23/2008   S/P MVR (mitral valve repair) 12/23/2008   Other specified postprocedural states 12/23/2008    ONSET DATE: Referral: 06/16/2023 Hospitalization 06/09/23 - 06/17/23  REFERRING DIAG: I63.10 (ICD-10-CM) - 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   Pt accompanied by: self  PERTINENT HISTORY:  PMHx: rheumatic heart valve disease s/p MVR and TAVR, papillary thyroid carcinoma follicular variant (s/p thyroidectomy), hypothyroidism,  pulmonary HTN, valvular cardiomyopathy/HF, HLD, CKD, HTN, TIA, T2DM, history of GIB who was admitted to the [facility] at [facility] for right sided weakness.  CT negative for acute intracranial hemorrhage but revealed minimal hyperdensity seen in the left insular M2 concerning for possible thrombus, and encephalomalacia in the L parietal and occipital lobes. CTA demonstrated focal embolus within the L inferior division of M2 branch with diminished distal perfusion and tortuous common carotid arteries bilaterally.  Embolus concerned to be from rheumatic heart valvular disease. Abbreviated TEE performed due to patient becoming hypoxic likely secondary to bronchospasm/laryngospasm and requiring intubation and short ICU admission.   PRECAUTIONS: Fall  WEIGHT BEARING RESTRICTIONS: No  PAIN:  Are you having pain? No  FALLS: Has patient fallen in last 6 months? No  LIVING ENVIRONMENT: Lives with: lives with their daughter Lives in: apartment Stairs: No Has following equipment at home: None  PLOF: Independent - pt did not drive and was on disability (s/p heart surgery & thyroid cancer)  PATIENT GOALS: Pt unable to identify anything specific but upon completion of eval, was in agreement to treatment to address observed weakness and lack of coordination  OBJECTIVE:  Note: Objective measures were completed at Evaluation unless otherwise noted.  HAND DOMINANCE: Right  ADLs: Overall ADLs: Independent - no concerns from daughter either Transfers/ambulation related to ADLs: Ind Tub Shower transfers: Ind without Camera operator: none  IADLs: Shopping: Goes with daughter Light housekeeping: Lets her daughter do it Meal Prep: Daughter likes to [employer] mobility: Ind - does not use Social research officer, government very often Medication management: Letting daughter do it for ie) sort into pill box Financial management: Ind Handwriting:  NT - no concerns reported  MOBILITY STATUS: Independent  POSTURE  COMMENTS:  rounded shoulders and forward head Sitting balance: WNL  ACTIVITY TOLERANCE: Activity tolerance: Similar to prior to stroke, learning to pace herself  FUNCTIONAL OUTCOME MEASURES: Quick Dash: 4.5  UPPER EXTREMITY ROM:    Active ROM Right eval Left eval  Shoulder flexion End range limitations End range limitations  Shoulder abduction    Shoulder adduction    Shoulder extension    Shoulder internal rotation    Shoulder external rotation    Elbow flexion    Elbow extension    Wrist flexion    Wrist extension    Wrist ulnar deviation    Wrist radial deviation    Wrist pronation    Wrist supination    (Blank rows = not tested)  UPPER EXTREMITY MMT:     MMT Right eval Left eval  Shoulder flexion 3+ 3+  Shoulder abduction    Shoulder adduction    Shoulder extension    Shoulder internal rotation  Shoulder external rotation    Middle trapezius    Lower trapezius    Elbow flexion    Elbow extension    Wrist flexion    Wrist extension    Wrist ulnar deviation    Wrist radial deviation    Wrist pronation    Wrist supination    (Blank rows = not tested)  HAND FUNCTION: Grip strength: Right: 26, 25, 27  lbs; Left: 30, 30, 30  lbs Average Right: 26 lbs; Left 30 lbs  07/26/23: Right - 35, 35, 35 Average 35 lbs Left - 30, 36, 31, Average 32.3 lbs  COORDINATION: 9 Hole Peg test: Right: 54.38 sec; Left: 43.10 sec  07/26/23 Right - Trial #1 1:08 Trial #2 42.77 Left - Trial #1 39.2  SENSATION: Not tested  EDEMA: NA  MUSCLE TONE: WFL  COGNITION: Overall cognitive status:  Some trouble with stuttering when she gets talking too fast.  VISION: Subjective report: NA Baseline vision: Wears glasses for reading only Visual history:  NA  VISION ASSESSMENT: Not tested  Patient has difficulty with following activities due to following visual impairments: NA  PERCEPTION: Not tested  PRAXIS: Not tested  OBSERVATIONS: Pt ambulates with no AE and no  loss of balance. The pt appears well kept and has t- shirt/athletic pants and sneakers donned. Pt accompanied by her daughter.                                                                                                                           TODAY'S TREATMENT:   Therapeutic Activities:  Reviewed and provided pt handouts re: micrographia based on last weeks practice with writing. Reviewed handout that provided a list of ideas on how to make writing easier, as provided in pt instructions.  Pt engaged in grip strength and coordination retesting with improvements not in grip from Eval to now Right: up to 35 lbs from 26 lbs; Left up to 32.3 lbs from 30 lbs  Pt's initial attempt a the 9 Hole Peg test: with her Right hand was up from eval ie) 1:08 min compared to Eval 54.38 sec, although Left: had improved to 39.2 sec from eval of 43.10 sec.  Pt engaged in various FM/strengthening activities with resisted clothespins, pushing pegs into theraputty, putting marbles atop golf tee like pegs etc and them pt was engage din 2nd trial of 9 hole peg test with significant improvement to 42.77 sec.    Facilitated pinch strengthening with use of therapy resistant clothespins to target lateral and 3 point pinch of R/L hand.  Able to pinch yellow, red (light resistance), green, blue (moderate resistance), and black pins (heavy resistance) with good tolerance.  Therapeutic Exercises: Pt educated in using big, deliberate movements, per handout in pt instructions with B UE to decrease stiffness, increase digital ROM/extension, decrease tremulous motions and overall increase UE coordination for different fine motor activities. Pt noted to have some decreased wrist and digital extension on R side and  encouraged to work on the big motions to help prepare her for coordination activities.  Patient benefited from extra time, verbal/tactile cues, and modeling of task to allow time for processing of verbal instructions  and improve motor planning of unfamiliar movements.   PATIENT EDUCATION: Education details: UE HEP ideas for big movements to help with Coordination activities Person educated: Patient Education method: Explanation, Demonstration, Tactile cues, Verbal cues, and Handouts Education comprehension: verbalized understanding, returned demonstration, verbal cues required, tactile cues required, and needs further education  HOME EXERCISE PROGRAM: 07/05/23: Putty Activities - Access Code: [facility] 07/20/23: Coordination Activities 07/26/23: Pwr! Hands handout for big deliberate movements  GOALS: Goals reviewed with patient? Yes  LONG TERM GOALS: Target date: 07/28/23  Patient will demonstrate updated B UE HEP with visual handouts only for proper execution. Baseline: New to outpt OT  Goal status: IN Progress  2.  Patient will demonstrate at least 5 lb improvement in B UE grip strength as needed to open jars and other containers. Baseline: Right: 26 lbs; Left 30 lbs Goal status: IN Progress Left; MET on Right  3.  Patient will demo improved FM coordination as evidenced by completing nine-hole peg 5+ seconds faster with use of BUEs  Baseline: 9 Hole Peg test: Right: 54.38 sec; Left: 43.10 sec Goal status: MET 07/26/23  4.  Pt will verbalize understanding of adapted strategies and/or equipment PRN to increase safety and independence with ADLs and IADLs. Baseline: New to outpt OT  Goal status: IN rogress  5. Pt will report increased ease and safety with participation in aspects of IADLs and leisure tasks. Baseline: daughter performs majority of home management etc  Goal status: IN Progress  ASSESSMENT:  CLINICAL IMPRESSION: Patient is a 63 y.o. female who was seen today for occupational therapy treatment s/p cerebral infarction.  Patient working well of functional activities at home and responded well to HEP ideas with big deliberate movement encouraged to help with coordination today.  Patient  currently presents below baseline level of function with B UE strength and coordination and will benefit from continued skilled OT services in the outpatient setting to work on impairments as noted below to help pt return to PLOF as able.    PERFORMANCE DEFICITS: in functional skills including ADLs, IADLs, coordination, dexterity, ROM, strength, flexibility, Fine motor control, Gross motor control, balance, decreased knowledge of use of DME, and UE functional use, cognitive skills including attention, safety awareness, thought, and understand, and psychosocial skills including coping strategies and environmental adaptation.   IMPAIRMENTS: are limiting patient from ADLs, IADLs, and leisure.   CO-MORBIDITIES: has co-morbidities such as CHF, HTN, TIAs  that affects occupational performance. Patient will benefit from skilled OT to address above impairments and improve overall function.  REHAB POTENTIAL: Good  PLAN:  OT FREQUENCY: 1x/week  OT DURATION: up to 6 weeks - currently planned for 4 weeks  PLANNED INTERVENTIONS: 97535 self care/ADL training, 62130 therapeutic exercise, 97530 therapeutic activity, 97112 neuromuscular re-education, functional mobility training, energy conservation, coping strategies training, patient/family education, and DME and/or AE instructions  RECOMMENDED OTHER SERVICES: NA - Pt declined PT/ST services s/p evals today  CONSULTED AND AGREED WITH PLAN OF CARE: Patient and family member/caregiver  PLAN FOR NEXT SESSION:  DC versus UPOC Review HEPs - review strength (putty/theraband), coordination Explore leisure pursuits, writing activities - micrographia handout already provided Combine standing with FM tasks AE considerations   Victorino Sparrow, OT 07/26/2023, 5:03 PM

## 2023-07-26 NOTE — Patient Instructions (Signed)
PWR! Hand Exercises Perform each exercise at least 10 repetitions 1x/day, and PWR! PUSH throughout the day when you are having trouble using your hands (picking up small objects, writing, eating, typing, buttoning, etc.). ** Make each movement big and deliberate; feel the movement.  PWR! UP: Fists to open fingers BIG  PWR! Rock:  Move wrists up and down [employer]! Twist: Twist palms up and down BIG  PWR! Step: Step your thumb to index finger while keeping other fingers straight. Flick fingers out BIG (thumb out/straighten fingers). Repeat with other fingers.  PWR! PUSH: Push hands out BIG. Elbows straight, wrists up, fingers open and spread apart BIG.

## 2023-08-02 ENCOUNTER — Ambulatory Visit: Payer: Self-pay | Admitting: Occupational Therapy

## 2023-08-02 ENCOUNTER — Encounter: Payer: Self-pay | Admitting: Occupational Therapy

## 2023-08-02 DIAGNOSIS — M6281 Muscle weakness (generalized): Secondary | ICD-10-CM | POA: Diagnosis not present

## 2023-08-02 DIAGNOSIS — R278 Other lack of coordination: Secondary | ICD-10-CM

## 2023-08-02 DIAGNOSIS — R208 Other disturbances of skin sensation: Secondary | ICD-10-CM

## 2023-08-02 DIAGNOSIS — R29818 Other symptoms and signs involving the nervous system: Secondary | ICD-10-CM

## 2023-08-02 NOTE — Therapy (Unsigned)
 OUTPATIENT OCCUPATIONAL THERAPY NEURO TREATMENT  Patient Name: [name] MRN: [id number] DOB:[birth year], 63 y.o., female Today's Date: 08/02/2023  PCP: Fortunato Curling, DO REFERRING PROVIDER: Latrelle Dodrill, MD  END OF SESSION:  OT End of Session - 08/02/23 1535     Visit Number 5    Number of Visits 10    Date for OT Re-Evaluation 09/01/23    Authorization Type [employer] Auth Reqd VL: 27    OT Start Time 1533    OT Stop Time 1620    OT Time Calculation (min) 47 min    Equipment Utilized During Treatment FM objects, testing material    Activity Tolerance Patient tolerated treatment well    Behavior During Therapy WFL for tasks assessed/performed             Past Medical History:  Diagnosis Date   Chronic combined systolic and diastolic CHF (congestive heart failure) (HCC)    EF now down to 25-30%. LVEDP on cath was 30 mmHg with wedge pressure of 31 mmHg.   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  mean PA 74/39 mean 52, mean PCWP 31 (with V wave of 45 mmHg), LVEDP ~ 30 mmHg.   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 Progressive worsening of aortic and mitral valve disease.   Past Surgical History:  Procedure Laterality Date   AORTIC VALVE REPAIR  August 2010   Suture plication of all 3 commissures   CARDIAC CATHETERIZATION  August 2010   Preop: nonobstructive coronary disease   CESAREAN SECTION  1981; 1996   MITRAL VALVE REPAIR  August 2010    26 mm Sorin [facility] 3D Ring Annuloplasty   RIGHT HEART CATH N/A 08/30/2016   Procedure: Right Heart Cath;  Surgeon: Laurey Morale, MD;  Location: [facility] INVASIVE CV LAB;  Service: Cardiovascular;  Laterality: N/A;   RIGHT/LEFT HEART CATH AND CORONARY ANGIOGRAPHY N/A 07/08/2016   Procedure: Right/Left Heart Cath and Coronary Angiography;  Surgeon: Marykay Lex, MD;  Location: [facility] INVASIVE CV LAB;  Service: Cardiovascular: Normal coronaries, mean RA 18, RV pressure/EDP: 70/12/18 mmHg.  mean PA 74/39  mean 52, mean PCWP 31 (with V wave of 45 mmHg), LVEDP ~ 30 mmHg.  CI 1.98 Fick. Peak AoV gradient ~15 mHg     TEE WITHOUT CARDIOVERSION N/A 07/13/2016   Procedure: Transesophageal Echocardiogram (TEE);  Surgeon: Chrystie Nose, MD;  Location: [facility] ENDOSCOPY;  Service: Cardiovascular:  EF 25-30%, dilated LV - diffuse hypokinesis, rheumatic-appearing Aortic Vallve s/p repair with moderate AS (AVA 1.1 cm^2), moderate central AI.  MV s/p repeat with mild mitral stenosis and eccentric severe MR.     TEE WITHOUT CARDIOVERSION     TEE WITHOUT CARDIOVERSION N/A 06/28/2017   Procedure: TRANSESOPHAGEAL ECHOCARDIOGRAM (TEE);  Surgeon: Laurey Morale, MD;   Location: [facility] ENDOSCOPY;  Service: Cardiovascular;  Laterality: N/A;   TEE WITHOUT CARDIOVERSION N/A 11/21/2017   Procedure: TRANSESOPHAGEAL ECHOCARDIOGRAM (TEE);  Surgeon: Tonny Bollman, MD;  Location: [facility] OR;  Service: Open Heart Surgery;  Laterality: N/A;   THYROID LOBECTOMY Left 09/17/2009   which showed a 3.5 cm follicular variant papillary cancer   THYROIDECTOMY     "2nd OR on my thyroid they took out the whole thing"   TRANSCATHETER AORTIC VALVE REPLACEMENT, TRANSFEMORAL  11/21/2017   Transcatheter Aortic Valve Replacement - Percutaneous Right Transfemoral Approach   TRANSCATHETER AORTIC VALVE REPLACEMENT, TRANSFEMORAL N/A 11/21/2017   Procedure: TRANSCATHETER AORTIC VALVE REPLACEMENT, TRANSFEMORAL using a 23mm Edwards Sapien 3 Aortic Valve;  Surgeon: Tonny Bollman, MD;  Location: [facility] OR;  Service: Open Heart Surgery;  Laterality: N/A;   TRANSESOPHAGEAL ECHOCARDIOGRAM (CATH LAB) N/A 06/12/2023   Procedure: TRANSESOPHAGEAL ECHOCARDIOGRAM;  Surgeon: Little Ishikawa, MD;  Location: [facility] INVASIVE CV LAB;  Service: Cardiovascular;  Laterality: N/A;   TRANSTHORACIC ECHOCARDIOGRAM  05/2016   EF 25-30%. Moderate aortic stenosis with moderate regurgitation. Also potential aortic stenosis with likely worse than expected regurgitation   TRANSTHORACIC ECHOCARDIOGRAM  May 2012    EF 40-45%, inferior hypokinesis. Slightly increased transaortic velocity = mild AS with mild to moderate AI normal MV gradients with no MR. Significantly improved PA pressures. Paradoxical septal motion.   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   Pulmonary hypertension (HCC)    Valvular cardiomyopathy (HCC)    History of GI bleeding    Grade II hemorrhoids 07/06/2016   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   Papillary thyroid carcinoma (HCC) 05/10/2016   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 03/31/2014   Acute on chronic combined systolic and diastolic CHF (congestive heart failure) (HCC)    Obesity, Class III, BMI 40-49.9 (morbid obesity) (HCC)    Neck pain 03/05/2014   Hyperlipidemia with target LDL less than 100 12/16/2013   Hypothyroidism 12/16/2013   Hx of thyroid cancer 12/16/2013   Chronic chest wall pain 10/11/2012   TIA (transient ischemic attack) 05/31/2012   Cardiomyopathy (HCC) 02/28/2012   CHF (congestive heart failure) (HCC) 02/28/2012   Depression 02/13/2012   HTN (hypertension) 02/06/2012   Presence of prosthetic heart valve 02/06/2012   Post-menopausal bleeding 12/05/2011   Fibroids 12/05/2011   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   S/P aortic valve  repair 12/23/2008   S/P MVR (mitral valve repair) 12/23/2008   Other specified postprocedural states 12/23/2008    ONSET DATE: Referral: 06/16/2023 Hospitalization 06/09/23 - 06/17/23  REFERRING DIAG: I63.10 (ICD-10-CM) - 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  She plans to get a brace for support.   Pt accompanied by: self  PERTINENT HISTORY:  PMHx: rheumatic heart valve disease s/p MVR and TAVR, papillary thyroid carcinoma follicular variant (s/p thyroidectomy), hypothyroidism,  pulmonary HTN, valvular cardiomyopathy/HF, HLD, CKD, HTN, TIA, T2DM, history of GIB who was admitted to the [facility] for right sided weakness.  CT negative for acute intracranial hemorrhage but revealed minimal hyperdensity seen in the left insular M2 concerning for possible thrombus, and encephalomalacia in the L parietal and occipital lobes. CTA demonstrated focal embolus within the L inferior division of M2 branch with diminished distal perfusion and tortuous common carotid arteries bilaterally.  Embolus concerned to be from rheumatic heart valvular disease. Abbreviated TEE performed due to patient becoming hypoxic likely secondary to bronchospasm/laryngospasm and requiring intubation and short ICU admission.   PRECAUTIONS: Fall  WEIGHT BEARING RESTRICTIONS: No  PAIN:  Are you having pain? No - just aching in R hand  FALLS: Has patient fallen in last 6 months? No  LIVING ENVIRONMENT: Lives with: lives with their daughter Lives in: apartment Stairs: No Has following equipment at home: None  PLOF: Independent - pt did not drive and was on disability (s/p heart surgery & thyroid cancer)  PATIENT GOALS: Pt unable to identify anything specific but upon completion of eval, was in agreement to treatment to address observed weakness and lack of coordination  OBJECTIVE:  Note: Objective measures were completed at Evaluation unless otherwise noted.  HAND DOMINANCE: Right  ADLs: Overall ADLs: Independent - no concerns from daughter either Transfers/ambulation related to ADLs: Ind Tub Shower transfers: Ind without Camera operator: none  IADLs: Shopping: Goes with daughter Light housekeeping: Lets her daughter do it Meal Prep: Daughter likes to [employer] mobility: Ind - does not use Social research officer, government very often Medication management: Letting daughter do it for ie) sort into pill box Financial management: Ind Handwriting:  NT - no concerns reported  MOBILITY STATUS:  Independent  POSTURE COMMENTS:  rounded shoulders and forward head Sitting balance: WNL  ACTIVITY TOLERANCE: Activity tolerance: Similar to prior to stroke, learning to pace herself  FUNCTIONAL OUTCOME MEASURES: Quick Dash: 4.5  UPPER EXTREMITY ROM:    Active ROM Right eval Left eval  Shoulder flexion End range limitations End range limitations  Shoulder abduction    Shoulder adduction    Shoulder extension    Shoulder internal rotation    Shoulder external rotation    Elbow flexion    Elbow extension    Wrist flexion    Wrist extension    Wrist ulnar deviation    Wrist radial deviation    Wrist pronation    Wrist supination    (Blank rows = not tested)  UPPER EXTREMITY MMT:     MMT Right eval Left eval  Shoulder flexion 3+ 3+  Shoulder abduction    Shoulder adduction    Shoulder extension    Shoulder internal  rotation    Shoulder external rotation    Middle trapezius    Lower trapezius    Elbow flexion    Elbow extension    Wrist flexion    Wrist extension    Wrist ulnar deviation    Wrist radial deviation    Wrist pronation    Wrist supination    (Blank rows = not tested)  HAND FUNCTION: Grip strength: Right: 26, 25, 27  lbs; Left: 30, 30, 30  lbs Average Right: 26 lbs; Left 30 lbs  07/26/23: Right - 35, 35, 35 Average 35 lbs Left - 30, 36, 31, Average 32.3 lbs  08/02/23 Right - 34, 36, 35 Average 35 lbs Left - 32, 33, 35 Average 33.3 lbs  Tip Right 6, Left 8 Tripod Right 9 Left 13 Lateral Right 10 Left 12  COORDINATION: 9 Hole Peg test: Right: 54.38 sec; Left: 43.10 sec  07/26/23 Right - Trial #1 1:08 Trial #2 42.77 Left - Trial #1 39.2  SENSATION: Not tested  EDEMA: NA  MUSCLE TONE: WFL  COGNITION: Overall cognitive status:  Some trouble with stuttering when she gets talking too fast.  VISION: Subjective report: NA Baseline vision: Wears glasses for reading only Visual history:  NA  VISION ASSESSMENT: Not  tested  Patient has difficulty with following activities due to following visual impairments: NA  PERCEPTION: Not tested  PRAXIS: Not tested  OBSERVATIONS: Pt ambulates with no AE and no loss of balance. The pt appears well kept and has t- shirt/athletic pants and sneakers donned. Pt accompanied by her daughter.                                                                                                                           TODAY'S TREATMENT:   Therapeutic Activities:  Reviewed and provided pt handouts re: micrographia based on last weeks practice with writing. Reviewed handout that provided a list of ideas on how to make writing easier, as provided in pt instructions.  Pt engaged in grip strength and coordination retesting with improvements not in grip from Eval to now Right: up to 35 lbs from 26 lbs; Left up to 32.3 lbs from 30 lbs  Pt's initial attempt a the 9 Hole Peg test: with her Right hand was up from eval ie) 1:08 min compared to Eval 54.38 sec, although Left: had improved to 39.2 sec from eval of 43.10 sec.  Pt engaged in various FM/strengthening activities with resisted clothespins, pushing pegs into theraputty, putting marbles atop golf tee like pegs etc and them pt was engage din 2nd trial of 9 hole peg test with significant improvement to 42.77 sec.    Facilitated pinch strengthening with use of therapy resistant clothespins to target lateral and 3 point pinch of R/L hand.  Able to pinch yellow, red (light resistance), green, blue (moderate resistance), and black pins (heavy resistance) with good tolerance.  Pt completed stereognosis challenge to improve sensory perception, item discrimination, and in  hand manipulation. Utilized various objects for stereognosis challenge with pt able to match *** objects with affected hand initially with no prior awareness of objects. Pt completed challenge with mild/mod errors. Pt completed same challenges with unaffected extremity for  additional feedback.    PATIENT EDUCATION: Education details: UE HEP ideas for big movements to help with Coordination activities Person educated: Patient Education method: Explanation, Demonstration, Tactile cues, Verbal cues, and Handouts Education comprehension: verbalized understanding, returned demonstration, verbal cues required, tactile cues required, and needs further education  HOME EXERCISE PROGRAM: 07/05/23: Putty Activities - Access Code: [facility] 07/20/23: Coordination Activities 07/26/23: Pwr! Hands handout for big deliberate movements  GOALS: Goals reviewed with patient? Yes  LONG TERM GOALS: Target date: 07/28/23  Patient will demonstrate updated B UE HEP with visual handouts only for proper execution. Baseline: New to outpt OT  Goal status: MET   3.  Patient will demo improved FM coordination as evidenced by completing nine-hole peg 5+ seconds faster with use of BUEs  Baseline: 9 Hole Peg test: Right: 54.38 sec; Left: 43.10 sec Goal status: MET 07/26/23  4.  Pt will verbalize understanding of adapted strategies and/or equipment PRN to increase safety and independence with ADLs and IADLs. Baseline: New to outpt OT  Goal status: MET  5. Pt will report increased ease and safety with participation in aspects of IADLs and leisure tasks. Baseline: daughter performs majority of home management etc  Goal status: MET  NEW GOALS: 2.  Patient will demonstrate at least 5 lb improvement in B UE grip strength as needed to open jars and other containers. Baseline: Right: 26 lbs; Left 30 lbs Goal status: IN Progress Left; MET on Right    ASSESSMENT:  CLINICAL IMPRESSION: Patient is a 63 y.o. female who was seen today for occupational therapy treatment s/p cerebral infarction.  Patient working well of functional activities at home and responded well to HEP ideas with big deliberate movement encouraged to help with coordination today.  Patient currently presents below baseline  level of function with B UE strength and coordination and will benefit from continued skilled OT services in the outpatient setting to work on impairments as noted below to help pt return to PLOF as able.    PERFORMANCE DEFICITS: in functional skills including ADLs, IADLs, coordination, dexterity, ROM, strength, flexibility, Fine motor control, Gross motor control, balance, decreased knowledge of use of DME, and UE functional use, cognitive skills including attention, safety awareness, thought, and understand, and psychosocial skills including coping strategies and environmental adaptation.   IMPAIRMENTS: are limiting patient from ADLs, IADLs, and leisure.   CO-MORBIDITIES: has co-morbidities such as CHF, HTN, TIAs  that affects occupational performance. Patient will benefit from skilled OT to address above impairments and improve overall function.  REHAB POTENTIAL: Good  PLAN:  OT FREQUENCY: 1x/week  OT DURATION: up to 6 weeks - currently planned for 4 weeks  PLANNED INTERVENTIONS: 97535 self care/ADL training, 40981 therapeutic exercise, 97530 therapeutic activity, 97112 neuromuscular re-education, functional mobility training, energy conservation, coping strategies training, patient/family education, and DME and/or AE instructions  RECOMMENDED OTHER SERVICES: NA - Pt declined PT/ST services s/p evals today  CONSULTED AND AGREED WITH PLAN OF CARE: Patient and family member/caregiver  PLAN FOR NEXT SESSION:  DC versus UPOC Review HEPs - review strength (putty/theraband), coordination Explore leisure pursuits, writing activities - micrographia handout already provided Combine standing with FM tasks AE considerations   Victorino Sparrow, OT 08/02/2023, 4:41 PM

## 2023-08-08 ENCOUNTER — Ambulatory Visit: Admitting: Occupational Therapy

## 2023-08-08 ENCOUNTER — Ambulatory Visit: Payer: Self-pay | Admitting: Occupational Therapy

## 2023-08-08 DIAGNOSIS — M6281 Muscle weakness (generalized): Secondary | ICD-10-CM | POA: Diagnosis not present

## 2023-08-08 DIAGNOSIS — R29818 Other symptoms and signs involving the nervous system: Secondary | ICD-10-CM

## 2023-08-08 DIAGNOSIS — R278 Other lack of coordination: Secondary | ICD-10-CM

## 2023-08-08 DIAGNOSIS — R208 Other disturbances of skin sensation: Secondary | ICD-10-CM

## 2023-08-08 NOTE — Therapy (Signed)
 OUTPATIENT OCCUPATIONAL THERAPY NEURO TREATMENT  Patient Name: [name] MRN: [id number] DOB:April 09, 1961, 63 y.o., female Today's Date: 08/08/2023  PCP: Fortunato Curling, DO REFERRING PROVIDER: Latrelle Dodrill, MD  END OF SESSION:  OT End of Session - 08/08/23 1530     Visit Number 6    Number of Visits 10    Date for OT Re-Evaluation 09/01/23    Authorization Type [employer] Auth Reqd VL: 27    OT Start Time 1447    OT Stop Time 1526    OT Time Calculation (min) 39 min    Activity Tolerance Patient tolerated treatment well    Behavior During Therapy WFL for tasks assessed/performed              Past Medical History:  Diagnosis Date   Chronic combined systolic and diastolic CHF (congestive heart failure) (HCC)    EF now down to 25-30%. LVEDP on cath was 30 mmHg with wedge pressure of 31 mmHg.   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  mean PA 74/39 mean 52, mean PCWP 31 (with V wave of 45 mmHg), LVEDP ~ 30 mmHg.   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 Progressive worsening of aortic and mitral valve disease.   Past Surgical History:  Procedure Laterality Date   AORTIC VALVE REPAIR  August 2010   Suture plication of all 3 commissures   CARDIAC CATHETERIZATION  August 2010   Preop: nonobstructive coronary disease   CESAREAN SECTION  1981; 1996   MITRAL VALVE REPAIR  August 2010    26 mm Sorin [employer] 3D Ring Annuloplasty   RIGHT HEART CATH N/A 08/30/2016   Procedure: Right Heart Cath;  Surgeon: Laurey Morale, MD;  Location: [facility] INVASIVE CV LAB;  Service: Cardiovascular;  Laterality: N/A;   RIGHT/LEFT HEART CATH AND CORONARY ANGIOGRAPHY N/A 07/08/2016   Procedure: Right/Left Heart Cath and Coronary Angiography;  Surgeon: Marykay Lex, MD;  Location: [facility] INVASIVE CV LAB;  Service: Cardiovascular: Normal coronaries, mean RA 18, RV pressure/EDP: 70/12/18 mmHg.  mean PA 74/39 mean 52, mean PCWP 31 (with V wave of 45  mmHg), LVEDP ~ 30 mmHg.  CI 1.98 Fick. Peak AoV gradient ~15 mHg     TEE WITHOUT CARDIOVERSION N/A 07/13/2016   Procedure: Transesophageal Echocardiogram (TEE);  Surgeon: Chrystie Nose, MD;  Location: [facility] ENDOSCOPY;  Service: Cardiovascular:  EF 25-30%, dilated LV - diffuse hypokinesis, rheumatic-appearing Aortic Vallve s/p repair with moderate AS (AVA 1.1 cm^2), moderate central AI.  MV s/p repeat with mild mitral stenosis and eccentric severe MR.     TEE WITHOUT CARDIOVERSION     TEE WITHOUT CARDIOVERSION N/A 06/28/2017   Procedure: TRANSESOPHAGEAL ECHOCARDIOGRAM (TEE);  Surgeon: Laurey Morale, MD;  Location: [facility] ENDOSCOPY;  Service: Cardiovascular;  Laterality: N/A;    TEE WITHOUT CARDIOVERSION N/A 11/21/2017   Procedure: TRANSESOPHAGEAL ECHOCARDIOGRAM (TEE);  Surgeon: Tonny Bollman, MD;  Location: [facility] OR;  Service: Open Heart Surgery;  Laterality: N/A;   THYROID LOBECTOMY Left 09/17/2009   which showed a 3.5 cm follicular variant papillary cancer   THYROIDECTOMY     "2nd OR on my thyroid they took out the whole thing"   TRANSCATHETER AORTIC VALVE REPLACEMENT, TRANSFEMORAL  11/21/2017   Transcatheter Aortic Valve Replacement - Percutaneous Right Transfemoral Approach   TRANSCATHETER AORTIC VALVE REPLACEMENT, TRANSFEMORAL N/A 11/21/2017   Procedure: TRANSCATHETER AORTIC VALVE REPLACEMENT, TRANSFEMORAL using a 23mm Edwards Sapien 3 Aortic Valve;  Surgeon: Tonny Bollman, MD;  Location: [facility] OR;  Service: Open Heart Surgery;  Laterality: N/A;   TRANSESOPHAGEAL ECHOCARDIOGRAM (CATH LAB) N/A 06/12/2023   Procedure: TRANSESOPHAGEAL ECHOCARDIOGRAM;  Surgeon: Little Ishikawa, MD;  Location: [facility] INVASIVE CV LAB;  Service: Cardiovascular;  Laterality: N/A;   TRANSTHORACIC ECHOCARDIOGRAM  05/2016   EF 25-30%. Moderate aortic stenosis with moderate regurgitation. Also potential aortic stenosis with likely worse than expected regurgitation   TRANSTHORACIC ECHOCARDIOGRAM  May 2012    EF 40-45%, inferior hypokinesis. Slightly increased transaortic velocity = mild AS with mild to moderate AI normal MV gradients with no MR. Significantly improved PA pressures. Paradoxical septal motion.   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   Aortic  valve stenosis and insufficiency, rheumatic 07/27/2016   Pulmonary hypertension (HCC)    Valvular cardiomyopathy (HCC)    History of GI bleeding    Grade II hemorrhoids 07/06/2016   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   Papillary thyroid carcinoma (HCC) 05/10/2016   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 03/31/2014   Acute on chronic combined systolic and diastolic CHF (congestive heart failure) (HCC)    Obesity, Class III, BMI 40-49.9 (morbid obesity) (HCC)    Neck pain 03/05/2014   Hyperlipidemia with target LDL less than 100 12/16/2013   Hypothyroidism 12/16/2013   Hx of thyroid cancer 12/16/2013   Chronic chest wall pain 10/11/2012   TIA (transient ischemic attack) 05/31/2012   Cardiomyopathy (HCC) 02/28/2012   CHF (congestive heart failure) (HCC) 02/28/2012   Depression 02/13/2012   HTN (hypertension) 02/06/2012   Presence of prosthetic heart valve 02/06/2012   Post-menopausal bleeding 12/05/2011   Fibroids 12/05/2011   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   S/P aortic valve repair 12/23/2008   S/P MVR (mitral valve repair) 12/23/2008  Other specified postprocedural states 12/23/2008    ONSET DATE: Referral: 06/16/2023 Hospitalization 06/09/23 - 06/17/23  REFERRING DIAG: I63.10 (ICD-10-CM) - 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 Pt reported sleeping with L hand under head which led to L hand aching which resolved after waking up.  Pt accompanied by: self  PERTINENT HISTORY:  PMHx: rheumatic heart valve disease s/p MVR and TAVR, papillary thyroid carcinoma follicular variant (s/p thyroidectomy), hypothyroidism, pulmonary HTN,  valvular cardiomyopathy/HF, HLD, CKD, HTN, TIA, T2DM, history of GIB who was admitted to the [facility] Service at [facility] for right sided weakness.  CT negative for acute intracranial hemorrhage but revealed minimal hyperdensity seen in the left insular M2 concerning for possible thrombus, and encephalomalacia in the L parietal and occipital lobes. CTA demonstrated focal embolus within the L inferior division of M2 branch with diminished distal perfusion and tortuous common carotid arteries bilaterally.  Embolus concerned to be from rheumatic heart valvular disease. Abbreviated TEE performed due to patient becoming hypoxic likely secondary to bronchospasm/laryngospasm and requiring intubation and short ICU admission.   PRECAUTIONS: Fall  WEIGHT BEARING RESTRICTIONS: No  PAIN:  Are you having pain? No   FALLS: Has patient fallen in last 6 months? No  LIVING ENVIRONMENT: Lives with: lives with their daughter Lives in: apartment Stairs: No Has following equipment at home: None  PLOF: Independent - pt did not drive and was on disability (s/p heart surgery & thyroid cancer)  PATIENT GOALS: Pt unable to identify anything specific but upon completion of eval, was in agreement to treatment to address observed weakness and lack of coordination  OBJECTIVE:  Note: Objective measures were completed at Evaluation unless otherwise noted.  HAND DOMINANCE: Right  ADLs: Overall ADLs: Independent - no concerns from daughter either Transfers/ambulation related to ADLs: Ind Tub Shower transfers: Ind without Camera operator: none  IADLs: Shopping: Goes with daughter Light housekeeping: Lets her daughter do it Meal Prep: Daughter likes to [employer] mobility: Ind - does not use Social research officer, government very often Medication management: Letting daughter do it for ie) sort into pill box Financial management: Ind Handwriting:  NT - no concerns reported  MOBILITY STATUS: Independent  POSTURE COMMENTS:   rounded shoulders and forward head Sitting balance: WNL  ACTIVITY TOLERANCE: Activity tolerance: Similar to prior to stroke, learning to pace herself  FUNCTIONAL OUTCOME MEASURES: Quick Dash: 4.5  UPPER EXTREMITY ROM:    Active ROM Right eval Left eval  Shoulder flexion End range limitations End range limitations  Shoulder abduction    Shoulder adduction    Shoulder extension    Shoulder internal rotation    Shoulder external rotation    Elbow flexion    Elbow extension    Wrist flexion    Wrist extension    Wrist ulnar deviation    Wrist radial deviation    Wrist pronation    Wrist supination    (Blank rows = not tested)  UPPER EXTREMITY MMT:     MMT Right eval Left eval  Shoulder flexion 3+ 3+  Shoulder abduction    Shoulder adduction    Shoulder extension    Shoulder internal rotation    Shoulder external rotation    Middle  trapezius    Lower trapezius    Elbow flexion    Elbow extension    Wrist flexion    Wrist extension    Wrist ulnar deviation    Wrist radial deviation    Wrist pronation    Wrist supination    (Blank rows = not tested)  HAND FUNCTION: Grip strength: Right: 26, 25, 27  lbs; Left: 30, 30, 30  lbs Average Right: 26 lbs; Left 30 lbs  07/26/23: Right - 35, 35, 35 Average 35 lbs Left - 30, 36, 31, Average 32.3 lbs  08/02/23 Right - 34, 36, 35 Average 35 lbs Left - 32, 33, 35 Average 33.3 lbs  Tip Right 6, Left 8 Tripod Right 9 Left 13 Lateral Right 10 Left 12  COORDINATION: 9 Hole Peg test: Right: 54.38 sec; Left: 43.10 sec  07/26/23 Right - Trial #1 1:08 Trial #2 42.77 Left - Trial #1 39.2  SENSATION: Not tested  EDEMA: NA  MUSCLE TONE: WFL  COGNITION: Overall cognitive status:  Some trouble with stuttering when she gets talking too fast.  VISION: Subjective report: NA Baseline vision: Wears glasses for reading only Visual history:  NA  VISION ASSESSMENT: Not tested  Patient has difficulty with following  activities due to following visual impairments: NA  PERCEPTION: Not tested  PRAXIS: Not tested  OBSERVATIONS: Pt ambulates with no AE and no loss of balance. The pt appears well kept and has t- shirt/athletic pants and sneakers donned. Pt accompanied by her daughter.                                                                                                                           TODAY'S TREATMENT:   Self-Care OT educated pt on Sleep positioning. Handout provided, see pt instructions.  Pt verbalized understanding.  OT educated pt on UE nerve anatomy, dx, A/E options hand chopper and cut resistant (OT showed examples online). Pt acknowledged understanding of all.  Neuro Re-Ed OT educated pt on Sensory safety precautions. Handout provided, see pt instructions. Pt verbalized understanding.  Styrofoam cup (for visual feedback) grading of force with RUE - to improve grading of force, proprioception of affected UE, to improve FM coordination of affected UE.  Finding objects in a bag with eyes occluded - to improve stereognosis, proprioception of affected UE, to improve FM coordination of affected UE. Pt accurately located 100% of specific items.  Finding objects in bin of rice with eyes occluded - to improve stereognosis, proprioception of affected UE, to improve FM coordination of affected UE, to provide sensory input to affected UE for re-sensitization. OT encouraged pt to identify characteristics of different objects and pt benefited from min to mod v/c to identify various characteristics of items.   PATIENT EDUCATION: Education details: see today's tx above Person educated: Patient Education method: Explanation, Demonstration, Tactile cues, and Verbal cues Education comprehension: verbalized understanding, returned demonstration, verbal cues required, tactile cues required, and needs further education  HOME  EXERCISE PROGRAM: 07/05/23: Putty Activities - Access Code:  [facility] 07/20/23: Coordination Activities 07/26/23: Pwr! Hands handout for big deliberate movements 08/08/23 - sleep positioning, sensory precautions  GOALS: Goals reviewed with patient? Yes  LONG TERM GOALS: Target date: 07/28/23  Patient will demonstrate updated B UE HEP with visual handouts only for proper execution. Baseline: New to outpt OT  Goal status: MET  4.  Pt will verbalize understanding of adapted strategies and/or equipment PRN to increase safety and independence with ADLs and IADLs. Baseline: New to outpt OT  Goal status: MET  5. Pt will report increased ease and safety with participation in aspects of IADLs and leisure tasks. Baseline: daughter performs majority of home management etc  Goal status: MET  CONTINUED/NEW GOALS:  Cont: 2.  Patient will demonstrate at further 5 lb improvement in B UE grip strength as needed to open jars and other containers. Baseline: Eval: Right: 26 lbs; Left 30 lbs 08/03/23 Right: 35, Left: 33 lbs Goal status: IN Progress  Cont: 3.  Patient will demo improved FM coordination as evidenced by completing nine-hole peg 5+ seconds faster with use of BUEs  Baseline: 9 Hole Peg test: EVAL: Right: 54.38 sec; Left: 43.10 sec 07/26/23  Right - Trial #1 1:08 Trial #2 42.77 Left - Trial #1 39.2 Goal status: REVISED  6. Pt will independently recall the 5 main sensory precautions (cold, heat, sharp, chemical, and heavy) as needed to prevent injury/harm secondary to impairments.    Baseline: R UE described as dull  Goal status: IN Progress  7. Pt will demonstrate sensory compensation techniques to match 80% of objects by touch with vision occluded.  Baseline: RUE ~30% LUE ~75%  Goal Status: IN Progress   ASSESSMENT:  CLINICAL IMPRESSION: Pt tolerated tasks well. Pt may benefit from continuing education regarding safety awareness during functional tasks. Pt would benefit from skilled OT services to address deficits and increase overall  independence.  PERFORMANCE DEFICITS: in functional skills including ADLs, IADLs, coordination, dexterity, ROM, strength, flexibility, Fine motor control, Gross motor control, balance, decreased knowledge of use of DME, and UE functional use, cognitive skills including attention, safety awareness, thought, and understand, and psychosocial skills including coping strategies and environmental adaptation.   IMPAIRMENTS: are limiting patient from ADLs, IADLs, and leisure.   CO-MORBIDITIES: has co-morbidities such as CHF, HTN, TIAs  that affects occupational performance. Patient will benefit from skilled OT to address above impairments and improve overall function.  REHAB POTENTIAL: Good  PLAN:  OT FREQUENCY: 1x/week  OT DURATION: up to 6 weeks - currently planned for 4 weeks  PLANNED INTERVENTIONS: 97535 self care/ADL training, 16109 therapeutic exercise, 97530 therapeutic activity, 97112 neuromuscular re-education, functional mobility training, energy conservation, coping strategies training, patient/family education, and DME and/or AE instructions  RECOMMENDED OTHER SERVICES: NA - Pt declined PT/ST services s/p evals today  CONSULTED AND AGREED WITH PLAN OF CARE: Patient and family member/caregiver  PLAN FOR NEXT SESSION:   Review/progress HEPs - review strength (putty/theraband), coordination Explore leisure pursuits, writing activities - micrographia handout already provided Combine standing with FM tasks - e.g. resistive clips Continue sensory education PRN AE considerations   Wynetta Emery, OT 08/08/2023, 3:35 PM

## 2023-08-08 NOTE — Patient Instructions (Addendum)
 [name]

## 2023-08-09 ENCOUNTER — Encounter: Payer: Self-pay | Admitting: Occupational Therapy

## 2023-08-16 ENCOUNTER — Encounter: Payer: Self-pay | Admitting: Occupational Therapy

## 2023-08-16 ENCOUNTER — Ambulatory Visit: Payer: Self-pay | Admitting: Occupational Therapy

## 2023-08-16 DIAGNOSIS — M6281 Muscle weakness (generalized): Secondary | ICD-10-CM | POA: Diagnosis not present

## 2023-08-16 DIAGNOSIS — R208 Other disturbances of skin sensation: Secondary | ICD-10-CM

## 2023-08-16 DIAGNOSIS — R29818 Other symptoms and signs involving the nervous system: Secondary | ICD-10-CM

## 2023-08-16 DIAGNOSIS — R278 Other lack of coordination: Secondary | ICD-10-CM

## 2023-08-16 NOTE — Therapy (Unsigned)
 OUTPATIENT OCCUPATIONAL THERAPY NEURO TREATMENT  Patient Name: [name] MRN: [id number] DOB:[birth year], 64 y.o., female Today's Date: 08/16/2023  PCP: Fortunato Curling, DO REFERRING PROVIDER: Latrelle Dodrill, MD  END OF SESSION:  OT End of Session - 08/16/23 1429     Visit Number 7    Number of Visits 10    Date for OT Re-Evaluation 09/01/23    Authorization Type [employer] Auth Reqd VL: 27    OT Start Time 1430    OT Stop Time 1530    OT Time Calculation (min) 60 min    Equipment Utilized During Treatment Cards    Activity Tolerance Patient tolerated treatment well    Behavior During Therapy WFL for tasks assessed/performed              Past Medical History:  Diagnosis Date   Chronic combined systolic and diastolic CHF (congestive heart failure) (HCC)    EF now down to 25-30%. LVEDP on cath was 30 mmHg with wedge pressure of 31 mmHg.   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  mean PA 74/39 mean 52, mean PCWP 31 (with V wave of 45 mmHg), LVEDP ~ 30 mmHg.   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 Progressive worsening of aortic and mitral valve disease.   Past Surgical History:  Procedure Laterality Date   AORTIC VALVE REPAIR  August 2010   Suture plication of all 3 commissures   CARDIAC CATHETERIZATION  August 2010   Preop: nonobstructive coronary disease   CESAREAN SECTION  1981; 1996   MITRAL VALVE REPAIR  August 2010    26 mm Sorin [facility] 3D Ring Annuloplasty   RIGHT HEART CATH N/A 08/30/2016   Procedure: Right Heart Cath;  Surgeon: Laurey Morale, MD;  Location: [facility] INVASIVE CV LAB;  Service: Cardiovascular;  Laterality: N/A;   RIGHT/LEFT HEART CATH AND CORONARY ANGIOGRAPHY N/A 07/08/2016   Procedure: Right/Left Heart Cath and Coronary Angiography;  Surgeon: Marykay Lex, MD;  Location: [facility] INVASIVE CV LAB;  Service: Cardiovascular: Normal coronaries, mean RA 18, RV pressure/EDP: 70/12/18 mmHg.  mean PA 74/39 mean 52,  mean PCWP 31 (with V wave of 45 mmHg), LVEDP ~ 30 mmHg.  CI 1.98 Fick. Peak AoV gradient ~15 mHg     TEE WITHOUT CARDIOVERSION N/A 07/13/2016   Procedure: Transesophageal Echocardiogram (TEE);  Surgeon: Chrystie Nose, MD;  Location: [facility] ENDOSCOPY;  Service: Cardiovascular:  EF 25-30%, dilated LV - diffuse hypokinesis, rheumatic-appearing Aortic Vallve s/p repair with moderate AS (AVA 1.1 cm^2), moderate central AI.  MV s/p repeat with mild mitral stenosis and eccentric severe MR.     TEE WITHOUT CARDIOVERSION     TEE WITHOUT CARDIOVERSION N/A 06/28/2017   Procedure: TRANSESOPHAGEAL ECHOCARDIOGRAM (TEE);  Surgeon: Laurey Morale, MD;  Location: [facility] ENDOSCOPY;   Service: Cardiovascular;  Laterality: N/A;   TEE WITHOUT CARDIOVERSION N/A 11/21/2017   Procedure: TRANSESOPHAGEAL ECHOCARDIOGRAM (TEE);  Surgeon: Tonny Bollman, MD;  Location: [facility] OR;  Service: Open Heart Surgery;  Laterality: N/A;   THYROID LOBECTOMY Left 09/17/2009   which showed a 3.5 cm follicular variant papillary cancer   THYROIDECTOMY     "2nd OR on my thyroid they took out the whole thing"   TRANSCATHETER AORTIC VALVE REPLACEMENT, TRANSFEMORAL  11/21/2017   Transcatheter Aortic Valve Replacement - Percutaneous Right Transfemoral Approach   TRANSCATHETER AORTIC VALVE REPLACEMENT, TRANSFEMORAL N/A 11/21/2017   Procedure: TRANSCATHETER AORTIC VALVE REPLACEMENT, TRANSFEMORAL using a 23mm Edwards Sapien 3 Aortic Valve;  Surgeon: Tonny Bollman, MD;  Location: [facility] OR;  Service: Open Heart Surgery;  Laterality: N/A;   TRANSESOPHAGEAL ECHOCARDIOGRAM (CATH LAB) N/A 06/12/2023   Procedure: TRANSESOPHAGEAL ECHOCARDIOGRAM;  Surgeon: Little Ishikawa, MD;  Location: [facility] INVASIVE CV LAB;  Service: Cardiovascular;  Laterality: N/A;   TRANSTHORACIC ECHOCARDIOGRAM  05/2016   EF 25-30%. Moderate aortic stenosis with moderate regurgitation. Also potential aortic stenosis with likely worse than expected regurgitation   TRANSTHORACIC ECHOCARDIOGRAM  May 2012    EF 40-45%, inferior hypokinesis. Slightly increased transaortic velocity = mild AS with mild to moderate AI normal MV gradients with no MR. Significantly improved PA pressures. Paradoxical septal motion.   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   Pulmonary hypertension (HCC)    Valvular cardiomyopathy (HCC)    History of GI bleeding    Grade II hemorrhoids 07/06/2016   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   Papillary thyroid carcinoma (HCC) 05/10/2016   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 03/31/2014   Acute on chronic combined systolic and diastolic CHF (congestive heart failure) (HCC)    Obesity, Class III, BMI 40-49.9 (morbid obesity) (HCC)    Neck pain 03/05/2014   Hyperlipidemia with target LDL less than 100 12/16/2013   Hypothyroidism 12/16/2013   Hx of thyroid cancer 12/16/2013   Chronic chest wall pain 10/11/2012   TIA (transient ischemic attack) 05/31/2012   Cardiomyopathy (HCC) 02/28/2012   CHF (congestive heart failure) (HCC) 02/28/2012   Depression 02/13/2012   HTN (hypertension) 02/06/2012   Presence of prosthetic heart valve 02/06/2012   Post-menopausal bleeding 12/05/2011   Fibroids 12/05/2011   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   S/P aortic valve repair 12/23/2008  S/P MVR (mitral valve repair) 12/23/2008   Other specified postprocedural states 12/23/2008    ONSET DATE: Referral: 06/16/2023 Hospitalization 06/09/23 - 06/17/23  REFERRING DIAG: I63.10 (ICD-10-CM) - 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  Pt accompanied by: self  PERTINENT HISTORY:  PMHx: rheumatic heart valve disease s/p MVR and TAVR, papillary thyroid carcinoma  follicular variant (s/p thyroidectomy), hypothyroidism, pulmonary HTN, valvular cardiomyopathy/HF, HLD, CKD, HTN, TIA, T2DM, history of GIB who was admitted to the [facility] Teaching Service at [facility] for right sided weakness.  CT negative for acute intracranial hemorrhage but revealed minimal hyperdensity seen in the left insular M2 concerning for possible thrombus, and encephalomalacia in the L parietal and occipital lobes. CTA demonstrated focal embolus within the L inferior division of M2 branch with diminished distal perfusion and tortuous common carotid arteries bilaterally.  Embolus concerned to be from rheumatic heart valvular disease. Abbreviated TEE performed due to patient becoming hypoxic likely secondary to bronchospasm/laryngospasm and requiring intubation and short ICU admission.   PRECAUTIONS: Fall  WEIGHT BEARING RESTRICTIONS: No  PAIN:  Are you having pain? No   FALLS: Has patient fallen in last 6 months? No  LIVING ENVIRONMENT: Lives with: lives with their daughter Lives in: apartment Stairs: No Has following equipment at home: None  PLOF: Independent - pt did not drive and was on disability (s/p heart surgery & thyroid cancer)  PATIENT GOALS: Pt unable to identify anything specific but upon completion of eval, was in agreement to treatment to address observed weakness and lack of coordination  OBJECTIVE:  Note: Objective measures were completed at Evaluation unless otherwise noted.  HAND DOMINANCE: Right  ADLs: Overall ADLs: Independent - no concerns from daughter either Transfers/ambulation related to ADLs: Ind Tub Shower transfers: Ind without Camera operator: none  IADLs: Shopping: Goes with daughter Light housekeeping: Lets her daughter do it Meal Prep: Daughter likes to [employer] mobility: Ind - does not use Social research officer, government very often Medication management: Letting daughter do it for ie) sort into pill box Financial management: Ind Handwriting:  NT - no  concerns reported  MOBILITY STATUS: Independent  POSTURE COMMENTS:  rounded shoulders and forward head Sitting balance: WNL  ACTIVITY TOLERANCE: Activity tolerance: Similar to prior to stroke, learning to pace herself  FUNCTIONAL OUTCOME MEASURES: Quick Dash: 4.5  UPPER EXTREMITY ROM:    Active ROM Right eval Left eval  Shoulder flexion End range limitations End range limitations  Shoulder abduction    Shoulder adduction    Shoulder extension    Shoulder internal rotation    Shoulder external rotation    Elbow flexion    Elbow extension    Wrist flexion    Wrist extension    Wrist ulnar deviation    Wrist radial deviation    Wrist pronation    Wrist supination    (Blank rows = not tested)  UPPER EXTREMITY MMT:     MMT Right eval Left eval  Shoulder flexion 3+ 3+  Shoulder abduction    Shoulder adduction    Shoulder extension    Shoulder internal rotation  Shoulder external rotation    Middle trapezius    Lower trapezius    Elbow flexion    Elbow extension    Wrist flexion    Wrist extension    Wrist ulnar deviation    Wrist radial deviation    Wrist pronation    Wrist supination    (Blank rows = not tested)  HAND FUNCTION: Grip strength: Right: 26, 25, 27  lbs; Left: 30, 30, 30  lbs Average Right: 26 lbs; Left 30 lbs  07/26/23: Right - 35, 35, 35 Average 35 lbs Left - 30, 36, 31, Average 32.3 lbs  08/02/23 Right - 34, 36, 35 Average 35 lbs Left - 32, 33, 35 Average 33.3 lbs  Tip Right 6, Left 8 Tripod Right 9 Left 13 Lateral Right 10 Left 12  COORDINATION: 9 Hole Peg test: Right: 54.38 sec; Left: 43.10 sec  07/26/23 Right - Trial #1 1:08 Trial #2 42.77 Left - Trial #1 39.2  SENSATION: Not tested  EDEMA: NA  MUSCLE TONE: WFL  COGNITION: Overall cognitive status:  Some trouble with stuttering when she gets talking too fast.  VISION: Subjective report: NA Baseline vision: Wears glasses for reading only Visual history:   NA  VISION ASSESSMENT: Not tested  Patient has difficulty with following activities due to following visual impairments: NA  PERCEPTION: Not tested  PRAXIS: Not tested  OBSERVATIONS: Pt ambulates with no AE and no loss of balance. The pt appears well kept and has t- shirt/athletic pants and sneakers donned. Pt accompanied by her daughter.                                                                                                                           TODAY'S TREATMENT:   Self-Care Due to patient reporting some increased depression/sadness lately, pt education and options provided to address concern including suggestion to reach out to physician as needed, talk with her children and explore community options.  Education provided re: mood changes after a stroke ie) a range of emotional and behavioral changes, including mood swings, depression, and difficulty controlling emotions can occur, due to brain damage affecting areas controlling emotions. Pt reassured she is not alone and reported several friends and family in the area checking on her.  Pt admitted to declining some social interactions which required extensive traveling/effort lately. She is encouraged to consider short, specific outings ie) ask a friend to go for coffee or her children to take her on a short drive.  One idea shared was the Medical laboratory scientific officer Resources website: [email or web address] and the day programs that they have available ie) at [facility] - a place where seniors 55+ can come enjoy fun and enriching activities. An April activity calendar was printed and reviewed along with provision of the website to explore more information with her children.  Therapeutic Activities OT educated pt on table top play of Golf Solitaire for BUE ROM, coordination, visual processing, scanning, and sequencing. Pt  required moderate to max cues for proper play ie) to  identify cards 1 above or 1 below the card flip on the discard pile as well as to locate the cards at the bottom of the stacks only.  Pt encouraged to consider other leisure activities as noted above in self care education with pt agreeing to reach out to her children and even other friends.  Facilitated pinch strengthening with use of therapy resistant clothespins to target lateral and 3 point pinch of R/L hand.  Able to pinch yellow, red (light resistance) easily, green, blue (moderate resistance) well, and black pins (heavy resistance) with extra time and repositioning.  PATIENT EDUCATION: Education details: Geographical information systems officer educated: Patient Education method: Programmer, multimedia, Facilities manager, Actor cues, Verbal cues, and Handouts Education comprehension: verbalized understanding, returned demonstration, verbal cues required, tactile cues required, and needs further education  HOME EXERCISE PROGRAM: 07/05/23: Putty Activities - Access Code: [facility] 07/20/23: Coordination Activities 07/26/23: Pwr! Hands handout for big deliberate movements 08/08/23 - sleep positioning, sensory precautions  GOALS: Goals reviewed with patient? Yes  LONG TERM GOALS: Target date: 07/28/23  Patient will demonstrate updated B UE HEP with visual handouts only for proper execution. Baseline: New to outpt OT  Goal status: MET  4.  Pt will verbalize understanding of adapted strategies and/or equipment PRN to increase safety and independence with ADLs and IADLs. Baseline: New to outpt OT  Goal status: MET  5. Pt will report increased ease and safety with participation in aspects of IADLs and leisure tasks. Baseline: daughter performs majority of home management etc  Goal status: MET  CONTINUED/NEW GOALS:  Cont: 2.  Patient will demonstrate at further 5 lb improvement in B UE grip strength as needed to open jars and other containers. Baseline: Eval: Right: 26 lbs; Left 30 lbs 08/03/23 Right: 35, Left: 33  lbs Goal status: IN Progress  Cont: 3.  Patient will demo improved FM coordination as evidenced by completing nine-hole peg 5+ seconds faster with use of BUEs  Baseline: 9 Hole Peg test: EVAL: Right: 54.38 sec; Left: 43.10 sec 07/26/23  Right - Trial #1 1:08 Trial #2 42.77 Left - Trial #1 39.2 Goal status: REVISED  6. Pt will independently recall the 5 main sensory precautions (cold, heat, sharp, chemical, and heavy) as needed to prevent injury/harm secondary to impairments.    Baseline: R UE described as dull  Goal status: IN Progress  7. Pt will demonstrate sensory compensation techniques to match 80% of objects by touch with vision occluded.  Baseline: RUE ~30% LUE ~75%  Goal Status: IN Progress   ASSESSMENT:  CLINICAL IMPRESSION: Patient is a 63 y.o. female who was seen today for occupational therapy evaluation for deficits following CVA with UE weakness and incoordination as well as some sadness today and cognitive tasks. Patient required mod to max cues today for Solitaire game and is given community resources to assist with leisure plans etc. Pt will benefit from continued skilled OT services in the outpatient setting to work on impairments still present, to help pt return to PLOF as able.   PERFORMANCE DEFICITS: in functional skills including ADLs, IADLs, coordination, dexterity, ROM, strength, flexibility, Fine motor control, Gross motor control, balance, decreased knowledge of use of DME, and UE functional use, cognitive skills including attention, safety awareness, thought, and understand, and psychosocial skills including coping strategies and environmental adaptation.   IMPAIRMENTS: are limiting patient from ADLs, IADLs, and leisure.   CO-MORBIDITIES: has co-morbidities such as CHF, HTN, TIAs  that affects occupational performance. Patient will benefit from skilled OT to address above impairments and improve overall function.  REHAB POTENTIAL: Good  PLAN:  OT FREQUENCY:  1x/week  OT DURATION: up to 6 weeks - currently planned for 4 weeks  PLANNED INTERVENTIONS: 97535 self care/ADL training, 81191 therapeutic exercise, 97530 therapeutic activity, 97112 neuromuscular re-education, functional mobility training, energy conservation, coping strategies training, patient/family education, and DME and/or AE instructions  RECOMMENDED OTHER SERVICES: NA - Pt declined PT/ST services s/p evals today  CONSULTED AND AGREED WITH PLAN OF CARE: Patient and family member/caregiver  PLAN FOR NEXT SESSION:  +2 more visits if needed How is depression? Review/progress HEPs - review strength (putty/theraband), coordination Explore leisure pursuits, writing activities - micrographia handout already provided Combine standing with FM tasks - e.g. resistive clips Continue sensory education PRN AE considerations   Victorino Sparrow, OT 08/16/2023, 5:16 PM

## 2023-08-16 NOTE — Patient Instructions (Signed)
 [email or web address]  The [facility] is a place where seniors 55+ can come enjoy fun and enriching activities. Adults 62 years of age and older are invited to socialize with their peers and participate in a variety of programs. The purpose of the [facility] is to provide a comfortable, supportive, and healthy environment for participants to engage in activities to enhance their independence, health, and quality of life.

## 2023-08-23 ENCOUNTER — Ambulatory Visit: Payer: Self-pay | Attending: Family Medicine | Admitting: Occupational Therapy

## 2023-08-23 DIAGNOSIS — R278 Other lack of coordination: Secondary | ICD-10-CM | POA: Insufficient documentation

## 2023-08-23 DIAGNOSIS — R29818 Other symptoms and signs involving the nervous system: Secondary | ICD-10-CM | POA: Diagnosis present

## 2023-08-23 DIAGNOSIS — M6281 Muscle weakness (generalized): Secondary | ICD-10-CM | POA: Diagnosis present

## 2023-08-23 DIAGNOSIS — R208 Other disturbances of skin sensation: Secondary | ICD-10-CM | POA: Insufficient documentation

## 2023-08-23 NOTE — Therapy (Unsigned)
 OUTPATIENT OCCUPATIONAL THERAPY NEURO TREATMENT  Patient Name: [name] MRN: [id number] DOB:1961/05/23, 63 y.o., female Today's Date: 08/23/2023  PCP: Fortunato Curling, DO REFERRING PROVIDER: Latrelle Dodrill, MD  END OF SESSION:  OT End of Session - 08/23/23 1452     Visit Number 8    Number of Visits 10    Date for OT Re-Evaluation 09/01/23    Authorization Type [employer] Auth Reqd VL: 27    OT Start Time 1450    OT Stop Time 1530    OT Time Calculation (min) 40 min    Equipment Utilized During Treatment Resistance bar, dynamometer, wrist wrap    Activity Tolerance Patient tolerated treatment well    Behavior During Therapy WFL for tasks assessed/performed              Past Medical History:  Diagnosis Date   Chronic combined systolic and diastolic CHF (congestive heart failure) (HCC)    EF now down to 25-30%. LVEDP on cath was 30 mmHg with wedge pressure of 31 mmHg.   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  mean PA 74/39 mean 52, mean PCWP 31 (with V wave of 45 mmHg), LVEDP ~ 30 mmHg.   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 Progressive worsening of aortic and mitral valve disease.   Past Surgical History:  Procedure Laterality Date   AORTIC VALVE REPAIR  August 2010   Suture plication of all 3 commissures   CARDIAC CATHETERIZATION  August 2010   Preop: nonobstructive coronary disease   CESAREAN SECTION  1981; 1996   MITRAL VALVE REPAIR  August 2010    26 mm [facility] 3D Ring Annuloplasty   RIGHT HEART CATH N/A 08/30/2016   Procedure: Right Heart Cath;  Surgeon: Laurey Morale, MD;  Location: [facility] INVASIVE CV LAB;  Service: Cardiovascular;  Laterality: N/A;   RIGHT/LEFT HEART CATH AND CORONARY ANGIOGRAPHY N/A 07/08/2016   Procedure: Right/Left Heart Cath and Coronary Angiography;  Surgeon: Marykay Lex, MD;  Location: [facility] INVASIVE CV LAB;  Service: Cardiovascular: Normal coronaries, mean RA 18, RV pressure/EDP: 70/12/18 mmHg.  mean  PA 74/39 mean 52, mean PCWP 31 (with V wave of 45 mmHg), LVEDP ~ 30 mmHg.  CI 1.98 Fick. Peak AoV gradient ~15 mHg     TEE WITHOUT CARDIOVERSION N/A 07/13/2016   Procedure: Transesophageal Echocardiogram (TEE);  Surgeon: Chrystie Nose, MD;  Location: [facility] ENDOSCOPY;  Service: Cardiovascular:  EF 25-30%, dilated LV - diffuse hypokinesis, rheumatic-appearing Aortic Vallve s/p repair with moderate AS (AVA 1.1 cm^2), moderate central AI.  MV s/p repeat with mild mitral stenosis and eccentric severe MR.     TEE WITHOUT CARDIOVERSION     TEE WITHOUT CARDIOVERSION N/A 06/28/2017   Procedure: TRANSESOPHAGEAL ECHOCARDIOGRAM (TEE);  Surgeon: Laurey Morale, MD;  Location: [facility] ENDOSCOPY;  Service: Cardiovascular;  Laterality: N/A;   TEE WITHOUT CARDIOVERSION N/A 11/21/2017   Procedure: TRANSESOPHAGEAL ECHOCARDIOGRAM (TEE);  Surgeon: Tonny Bollman, MD;  Location: [facility] OR;  Service: Open Heart Surgery;  Laterality: N/A;   THYROID LOBECTOMY Left 09/17/2009   which showed a 3.5 cm follicular variant papillary cancer   THYROIDECTOMY     "2nd OR on my thyroid they took out the whole thing"   TRANSCATHETER AORTIC VALVE REPLACEMENT, TRANSFEMORAL  11/21/2017   Transcatheter Aortic Valve Replacement - Percutaneous Right Transfemoral Approach   TRANSCATHETER AORTIC VALVE REPLACEMENT, TRANSFEMORAL N/A 11/21/2017   Procedure: TRANSCATHETER AORTIC VALVE REPLACEMENT, TRANSFEMORAL using a 23mm Edwards Sapien 3 Aortic Valve;  Surgeon: Tonny Bollman, MD;  Location: [facility] OR;  Service: Open Heart Surgery;  Laterality: N/A;   TRANSESOPHAGEAL ECHOCARDIOGRAM (CATH LAB) N/A 06/12/2023   Procedure: TRANSESOPHAGEAL ECHOCARDIOGRAM;  Surgeon: Little Ishikawa, MD;  Location: [facility] INVASIVE CV LAB;  Service: Cardiovascular;  Laterality: N/A;   TRANSTHORACIC ECHOCARDIOGRAM  05/2016   EF 25-30%. Moderate aortic stenosis with moderate regurgitation. Also potential aortic stenosis with likely worse than expected regurgitation   TRANSTHORACIC ECHOCARDIOGRAM  May 2012    EF 40-45%, inferior hypokinesis. Slightly increased transaortic velocity = mild AS with mild to moderate AI normal MV gradients with no MR. Significantly improved PA pressures. Paradoxical septal motion.   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   Pulmonary hypertension (HCC)    Valvular cardiomyopathy (HCC)    History of GI bleeding    Grade II hemorrhoids 07/06/2016   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   Papillary thyroid carcinoma (HCC) 05/10/2016   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 03/31/2014   Acute on chronic combined systolic and diastolic CHF (congestive heart failure) (HCC)    Obesity, Class III, BMI 40-49.9 (morbid obesity) (HCC)    Neck pain 03/05/2014   Hyperlipidemia with target LDL less than 100 12/16/2013   Hypothyroidism 12/16/2013   Hx of thyroid cancer 12/16/2013   Chronic chest wall pain 10/11/2012   TIA (transient ischemic attack) 05/31/2012   Cardiomyopathy (HCC) 02/28/2012   CHF (congestive heart failure) (HCC) 02/28/2012   Depression 02/13/2012   HTN (hypertension) 02/06/2012   Presence of prosthetic heart valve 02/06/2012   Post-menopausal bleeding 12/05/2011   Fibroids 12/05/2011   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   S/P  aortic valve repair 12/23/2008   S/P MVR (mitral valve repair) 12/23/2008   Other specified postprocedural states 12/23/2008    ONSET DATE: Referral: 06/16/2023 Hospitalization 06/09/23 - 06/17/23  REFERRING DIAG: I63.10 (ICD-10-CM) - 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  Talked with children about recent depression - son and daughter about it but hasn't made any plans, waiting to see when son comes to visit.  Pt accompanied  by: self  PERTINENT HISTORY:  PMHx: rheumatic heart valve disease s/p MVR and TAVR, papillary thyroid carcinoma follicular variant (s/p thyroidectomy), hypothyroidism, pulmonary HTN, valvular cardiomyopathy/HF, HLD, CKD, HTN, TIA, T2DM, history of GIB who was admitted to the [facility] at [facility] for right sided weakness.  CT negative for acute intracranial hemorrhage but revealed minimal hyperdensity seen in the left insular M2 concerning for possible thrombus, and encephalomalacia in the L parietal and occipital lobes. CTA demonstrated focal embolus within the L inferior division of M2 branch with diminished distal perfusion and tortuous common carotid arteries bilaterally.  Embolus concerned to be from rheumatic heart valvular disease. Abbreviated TEE performed due to patient becoming hypoxic likely secondary to bronchospasm/laryngospasm and requiring intubation and short ICU admission.   PRECAUTIONS: Fall  WEIGHT BEARING RESTRICTIONS: No  PAIN:  Are you having pain? No   FALLS: Has patient fallen in last 6 months? No  LIVING ENVIRONMENT: Lives with: lives with their daughter Lives in: apartment Stairs: No Has following equipment at home: None  PLOF: Independent - pt did not drive and was on disability (s/p heart surgery & thyroid cancer)  PATIENT GOALS: Pt unable to identify anything specific but upon completion of eval, was in agreement to treatment to address observed weakness and lack of coordination  OBJECTIVE:  Note: Objective measures were completed at Evaluation unless otherwise noted.  HAND DOMINANCE: Right  ADLs: Overall ADLs: Independent - no concerns from daughter either Transfers/ambulation related to ADLs: Ind Tub Shower transfers: Ind without Camera operator: none  IADLs: Shopping: Goes with daughter Light housekeeping: Lets her daughter do it Meal Prep: Daughter likes to [employer] mobility: Ind - does not use Social research officer, government very often Medication  management: Letting daughter do it for ie) sort into pill box Financial management: Ind Handwriting:  NT - no concerns reported  MOBILITY STATUS: Independent  POSTURE COMMENTS:  rounded shoulders and forward head Sitting balance: WNL  ACTIVITY TOLERANCE: Activity tolerance: Similar to prior to stroke, learning to pace herself  FUNCTIONAL OUTCOME MEASURES: Quick Dash: 4.5  UPPER EXTREMITY ROM:    Active ROM Right eval Left eval  Shoulder flexion End range limitations End range limitations  Shoulder abduction    Shoulder adduction    Shoulder extension    Shoulder internal rotation    Shoulder external rotation    Elbow flexion    Elbow extension    Wrist flexion    Wrist extension    Wrist ulnar deviation    Wrist radial deviation    Wrist pronation    Wrist supination    (Blank rows = not tested)  UPPER EXTREMITY MMT:     MMT Right eval Left eval  Shoulder  flexion 3+ 3+  Shoulder abduction    Shoulder adduction    Shoulder extension    Shoulder internal rotation    Shoulder external rotation    Middle trapezius    Lower trapezius    Elbow flexion    Elbow extension    Wrist flexion    Wrist extension    Wrist ulnar deviation    Wrist radial deviation    Wrist pronation    Wrist supination    (Blank rows = not tested)  HAND FUNCTION: Grip strength: Right: 26, 25, 27  lbs; Left: 30, 30, 30  lbs Average Right: 26 lbs; Left 30 lbs  07/26/23: Right - 35, 35, 35 Average 35 lbs Left - 30, 36, 31, Average 32.3 lbs  08/02/23 Right - 34, 36, 35 Average 35 lbs Left - 32, 33, 35 Average 33.3 lbs   08/23/23 Right - 39.0, 39.9, 41.2 Left - 35.4, 40.7, 38.8   Tip Right 6, Left 8 Tripod Right 9 Left 13 Lateral Right 10 Left 12  COORDINATION: 9 Hole Peg test: Right: 54.38 sec; Left: 43.10 sec  07/26/23 Right - Trial #1 1:08 Trial #2 42.77 Left - Trial #1 39.2  SENSATION: Not tested  EDEMA: NA  MUSCLE TONE: WFL  COGNITION: Overall cognitive  status:  Some trouble with stuttering when she gets talking too fast.  VISION: Subjective report: NA Baseline vision: Wears glasses for reading only Visual history:  NA  VISION ASSESSMENT: Not tested  Patient has difficulty with following activities due to following visual impairments: NA  PERCEPTION: Not tested  PRAXIS: Not tested  OBSERVATIONS: Pt ambulates with no AE and no loss of balance. The pt appears well kept and has t- shirt/athletic pants and sneakers donned. Pt accompanied by her daughter.                                                                                                                           TODAY'S TREATMENT:   Self-Care Due to patient reporting some increased depression/sadness lately, pt education and options provided to address concern including suggestion to reach out to physician as needed, talk with her children and explore community options.  Education provided re: mood changes after a stroke ie) a range of emotional and behavioral changes, including mood swings, depression, and difficulty controlling emotions can occur, due to brain damage affecting areas controlling emotions. Pt reassured she is not alone and reported several friends and family in the area checking on her.  Pt admitted to declining some social interactions which required extensive traveling/effort lately. She is encouraged to consider short, specific outings ie) ask a friend to go for coffee or her children to take her on a short drive.  One idea shared was the Medical laboratory scientific officer Resources website: https://www.senior-resources-[place].org/[facility] and the day programs that they have available ie) at [facility] - a place where seniors 55+ can come enjoy fun and enriching activities. An April activity calendar was  printed and reviewed along with provision of the website to explore more information with her children.  Therapeutic Activities OT  educated pt on table top play of Golf Solitaire for BUE ROM, coordination, visual processing, scanning, and sequencing. Pt required moderate to max cues for proper play ie) to identify cards 1 above or 1 below the card flip on the discard pile as well as to locate the cards at the bottom of the stacks only.  Pt encouraged to consider other leisure activities as noted above in self care education with pt agreeing to reach out to her children and even other friends.  Facilitated pinch strengthening with use of therapy resistant clothespins to target lateral and 3 point pinch of R/L hand.  Able to pinch yellow, red (light resistance) easily, green, blue (moderate resistance) well, and black pins (heavy resistance) with extra time and repositioning.  Writing   PATIENT EDUCATION: Education details: Geographical information systems officer educated: Patient Education method: Programmer, multimedia, Facilities manager, Actor cues, Verbal cues, and Handouts Education comprehension: verbalized understanding, returned demonstration, verbal cues required, tactile cues required, and needs further education  HOME EXERCISE PROGRAM: 07/05/23: Putty Activities - Access Code: [facility] 07/20/23: Coordination Activities 07/26/23: Pwr! Hands handout for big deliberate movements 08/08/23 - sleep positioning, sensory precautions  GOALS: Goals reviewed with patient? Yes  LONG TERM GOALS: Target date: 07/28/23  Patient will demonstrate updated B UE HEP with visual handouts only for proper execution. Baseline: New to outpt OT  Goal status: MET  4.  Pt will verbalize understanding of adapted strategies and/or equipment PRN to increase safety and independence with ADLs and IADLs. Baseline: New to outpt OT  Goal status: MET  5. Pt will report increased ease and safety with participation in aspects of IADLs and leisure tasks. Baseline: daughter performs majority of home management etc  Goal status: MET  CONTINUED/NEW GOALS:  Cont: 2.  Patient  will demonstrate at further 5 lb improvement in B UE grip strength as needed to open jars and other containers. Baseline: Eval: Right: 26 lbs; Left 30 lbs 08/03/23 Right: 35, Left: 33 lbs Goal status: IN Progress  Cont: 3.  Patient will demo improved FM coordination as evidenced by completing nine-hole peg 5+ seconds faster with use of BUEs  Baseline: 9 Hole Peg test: EVAL: Right: 54.38 sec; Left: 43.10 sec 07/26/23  Right - Trial #1 1:08 Trial #2 42.77 Left - Trial #1 39.2 Goal status: REVISED  6. Pt will independently recall the 5 main sensory precautions (cold, heat, sharp, chemical, and heavy) as needed to prevent injury/harm secondary to impairments.    Baseline: R UE described as dull  Goal status: IN Progress  7. Pt will demonstrate sensory compensation techniques to match 80% of objects by touch with vision occluded.  Baseline: RUE ~30% LUE ~75%  Goal Status: IN Progress   ASSESSMENT:  CLINICAL IMPRESSION: Patient is a 63 y.o. female who was seen today for occupational therapy evaluation for deficits following CVA with UE weakness and incoordination as well as some sadness today and cognitive tasks. Patient required mod to max cues today for Solitaire game and is given community resources to assist with leisure plans etc. Pt will benefit from continued skilled OT services in the outpatient setting to work on impairments still present, to help pt return to PLOF as able.   PERFORMANCE DEFICITS: in functional skills including ADLs, IADLs, coordination, dexterity, ROM, strength, flexibility, Fine motor control, Gross motor control, balance, decreased knowledge of use of DME, and UE  functional use, cognitive skills including attention, safety awareness, thought, and understand, and psychosocial skills including coping strategies and environmental adaptation.   IMPAIRMENTS: are limiting patient from ADLs, IADLs, and leisure.   CO-MORBIDITIES: has co-morbidities such as CHF, HTN, TIAs  that  affects occupational performance. Patient will benefit from skilled OT to address above impairments and improve overall function.  REHAB POTENTIAL: Good  PLAN:  OT FREQUENCY: 1x/week  OT DURATION: up to 6 weeks - currently planned for 4 weeks  PLANNED INTERVENTIONS: 97535 self care/ADL training, 82956 therapeutic exercise, 97530 therapeutic activity, 97112 neuromuscular re-education, functional mobility training, energy conservation, coping strategies training, patient/family education, and DME and/or AE instructions  RECOMMENDED OTHER SERVICES: NA - Pt declined PT/ST services s/p evals today  CONSULTED AND AGREED WITH PLAN OF CARE: Patient and family member/caregiver  PLAN FOR NEXT SESSION:  +2 more visits if needed How is depression? Review/progress HEPs - review strength (putty/theraband), coordination Explore leisure pursuits, writing activities - micrographia handout already provided Combine standing with FM tasks - e.g. resistive clips Continue sensory education PRN AE considerations   Victorino Sparrow, OT 08/23/2023, 4:49 PM

## 2023-08-30 ENCOUNTER — Ambulatory Visit: Payer: Self-pay | Admitting: Occupational Therapy

## 2023-08-30 DIAGNOSIS — M6281 Muscle weakness (generalized): Secondary | ICD-10-CM | POA: Diagnosis not present

## 2023-08-30 DIAGNOSIS — R29818 Other symptoms and signs involving the nervous system: Secondary | ICD-10-CM

## 2023-08-30 DIAGNOSIS — R278 Other lack of coordination: Secondary | ICD-10-CM

## 2023-08-30 DIAGNOSIS — R208 Other disturbances of skin sensation: Secondary | ICD-10-CM

## 2023-08-30 NOTE — Therapy (Unsigned)
 OUTPATIENT OCCUPATIONAL THERAPY NEURO TREATMENT  Patient Name: [name] MRN: [id number] DOB:January 03, 1961, 63 y.o., female Today's Date: 08/30/2023  PCP: Fortunato Curling, DO REFERRING PROVIDER: Latrelle Dodrill, MD  END OF SESSION:  OT End of Session - 08/30/23 1449     Visit Number 9    Number of Visits 10    Date for OT Re-Evaluation 09/08/23    Authorization Type [employer] Auth Reqd VL: 27    OT Start Time 1447    OT Stop Time 1530    OT Time Calculation (min) 43 min    Equipment Utilized During Treatment Resistance bar, dynamometer, wrist wrap    Activity Tolerance Patient tolerated treatment well    Behavior During Therapy WFL for tasks assessed/performed              Past Medical History:  Diagnosis Date   Chronic combined systolic and diastolic CHF (congestive heart failure) (HCC)    EF now down to 25-30%. LVEDP on cath was 30 mmHg with wedge pressure of 31 mmHg.   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  mean PA 74/39 mean 52, mean PCWP 31 (with V wave of 45 mmHg), LVEDP ~ 30 mmHg.   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 Progressive worsening of aortic and mitral valve disease.   Past Surgical History:  Procedure Laterality Date   AORTIC VALVE REPAIR  August 2010   Suture plication of all 3 commissures   CARDIAC CATHETERIZATION  August 2010   Preop: nonobstructive coronary disease   CESAREAN SECTION  1981; 1996   MITRAL VALVE REPAIR  August 2010    26 mm Sorin [facility] 3D Ring Annuloplasty   RIGHT HEART CATH N/A 08/30/2016   Procedure: Right Heart Cath;  Surgeon: Laurey Morale, MD;  Location: [facility] INVASIVE CV LAB;  Service: Cardiovascular;  Laterality: N/A;   RIGHT/LEFT HEART CATH AND CORONARY ANGIOGRAPHY N/A 07/08/2016   Procedure: Right/Left Heart Cath and Coronary Angiography;  Surgeon: Marykay Lex, MD;  Location: [facility] INVASIVE CV LAB;  Service: Cardiovascular: Normal coronaries, mean RA 18, RV pressure/EDP: 70/12/18 mmHg.  mean  PA 74/39 mean 52, mean PCWP 31 (with V wave of 45 mmHg), LVEDP ~ 30 mmHg.  CI 1.98 Fick. Peak AoV gradient ~15 mHg     TEE WITHOUT CARDIOVERSION N/A 07/13/2016   Procedure: Transesophageal Echocardiogram (TEE);  Surgeon: Chrystie Nose, MD;  Location: [facility] ENDOSCOPY;  Service: Cardiovascular:  EF 25-30%, dilated LV - diffuse hypokinesis, rheumatic-appearing Aortic Vallve s/p repair with moderate AS (AVA 1.1 cm^2), moderate central AI.  MV s/p repeat with mild mitral stenosis and eccentric severe MR.     TEE WITHOUT CARDIOVERSION     TEE WITHOUT CARDIOVERSION N/A 06/28/2017   Procedure: TRANSESOPHAGEAL ECHOCARDIOGRAM (TEE);  Surgeon: Laurey Morale, MD;  Location: [facility] ENDOSCOPY;  Service: Cardiovascular;  Laterality: N/A;   TEE WITHOUT CARDIOVERSION N/A 11/21/2017   Procedure: TRANSESOPHAGEAL ECHOCARDIOGRAM (TEE);  Surgeon: Tonny Bollman, MD;  Location: [facility] OR;  Service: Open Heart Surgery;  Laterality: N/A;   THYROID LOBECTOMY Left 09/17/2009   which showed a 3.5 cm follicular variant papillary cancer   THYROIDECTOMY     "2nd OR on my thyroid they took out the whole thing"   TRANSCATHETER AORTIC VALVE REPLACEMENT, TRANSFEMORAL  11/21/2017   Transcatheter Aortic Valve Replacement - Percutaneous Right Transfemoral Approach   TRANSCATHETER AORTIC VALVE REPLACEMENT, TRANSFEMORAL N/A 11/21/2017   Procedure: TRANSCATHETER AORTIC VALVE REPLACEMENT, TRANSFEMORAL using a 23mm Edwards Sapien 3 Aortic Valve;  Surgeon: Tonny Bollman, MD;  Location: [facility] OR;  Service: Open Heart Surgery;  Laterality: N/A;   TRANSESOPHAGEAL ECHOCARDIOGRAM (CATH LAB) N/A 06/12/2023   Procedure: TRANSESOPHAGEAL ECHOCARDIOGRAM;  Surgeon: Little Ishikawa, MD;  Location: [facility] INVASIVE CV LAB;  Service: Cardiovascular;  Laterality: N/A;   TRANSTHORACIC ECHOCARDIOGRAM  05/2016   EF 25-30%. Moderate aortic stenosis with moderate regurgitation. Also potential aortic stenosis with likely worse than expected regurgitation   TRANSTHORACIC ECHOCARDIOGRAM  May 2012    EF 40-45%, inferior hypokinesis. Slightly increased transaortic velocity = mild AS with mild to moderate AI normal MV gradients with no MR. Significantly improved PA pressures. Paradoxical septal motion.   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   Pulmonary hypertension (HCC)    Valvular cardiomyopathy (HCC)    History of GI bleeding    Grade II hemorrhoids 07/06/2016   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   Papillary thyroid carcinoma (HCC) 05/10/2016   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 03/31/2014   Acute on chronic combined systolic and diastolic CHF (congestive heart failure) (HCC)    Obesity, Class III, BMI 40-49.9 (morbid obesity) (HCC)    Neck pain 03/05/2014   Hyperlipidemia with target LDL less than 100 12/16/2013   Hypothyroidism 12/16/2013   Hx of thyroid cancer 12/16/2013   Chronic chest wall pain 10/11/2012   TIA (transient ischemic attack) 05/31/2012   Cardiomyopathy (HCC) 02/28/2012   CHF (congestive heart failure) (HCC) 02/28/2012   Depression 02/13/2012   HTN (hypertension) 02/06/2012   Presence of prosthetic heart valve 02/06/2012   Post-menopausal bleeding 12/05/2011   Fibroids 12/05/2011   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   S/P  aortic valve repair 12/23/2008   S/P MVR (mitral valve repair) 12/23/2008   Other specified postprocedural states 12/23/2008    ONSET DATE: Referral: 06/16/2023 Hospitalization 06/09/23 - 06/17/23  REFERRING DIAG: I63.10 (ICD-10-CM) - 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  Pt accompanied by: self  PERTINENT HISTORY:  PMHx: rheumatic heart valve disease s/p MVR and  TAVR, papillary thyroid carcinoma follicular variant (s/p thyroidectomy), hypothyroidism, pulmonary HTN, valvular cardiomyopathy/HF, HLD, CKD, HTN, TIA, T2DM, history of GIB who was admitted to the [place] [facility] for right sided weakness.  CT negative for acute intracranial hemorrhage but revealed minimal hyperdensity seen in the left insular M2 concerning for possible thrombus, and encephalomalacia in the L parietal and occipital lobes. CTA demonstrated focal embolus within the L inferior division of M2 branch with diminished distal perfusion and tortuous common carotid arteries bilaterally.  Embolus concerned to be from rheumatic heart valvular disease. Abbreviated TEE performed due to patient becoming hypoxic likely secondary to bronchospasm/laryngospasm and requiring intubation and short ICU admission.   PRECAUTIONS: Fall  WEIGHT BEARING RESTRICTIONS: No  PAIN:  NA upon Are you having pain? Yes: NPRS scale: not really hurting - just aching Pain location: right hand/inside palm Pain description: achy  Aggravating factors: not sure - happens occasionally throughout the day  Relieving factors: pain patch    FALLS: Has patient fallen in last 6 months? No  LIVING ENVIRONMENT: Lives with: lives with their daughter Lives in: apartment Stairs: No Has following equipment at home: None  PLOF: Independent - pt did not drive and was on disability (s/p heart surgery & thyroid cancer)  PATIENT GOALS: Pt unable to identify anything specific but upon completion of eval, was in agreement to treatment to address observed weakness and lack of coordination  OBJECTIVE:  Note: Objective measures were completed at Evaluation unless otherwise noted.  HAND DOMINANCE: Right  ADLs: Overall ADLs: Independent - no concerns from daughter either Transfers/ambulation related to ADLs: Ind Tub Shower transfers: Ind without Camera operator: none  IADLs: Shopping: Goes with daughter Light  housekeeping: Lets her daughter do it Meal Prep: Daughter likes to [employer] mobility: Ind - does not use Social research officer, government very often Medication management: Letting daughter do it for ie) sort into pill box Financial management: Ind Handwriting:  NT - no concerns reported  MOBILITY STATUS: Independent  POSTURE COMMENTS:  rounded shoulders and forward head Sitting balance: WNL  ACTIVITY TOLERANCE: Activity tolerance: Similar to prior to stroke, learning to pace herself  FUNCTIONAL OUTCOME MEASURES: Quick Dash: 4.5  UPPER EXTREMITY ROM:    Active ROM Right eval Left eval  Shoulder flexion End range limitations End range limitations  Shoulder abduction    Shoulder adduction    Shoulder extension    Shoulder internal rotation    Shoulder external rotation    Elbow flexion    Elbow extension    Wrist flexion    Wrist extension    Wrist ulnar deviation    Wrist radial deviation    Wrist pronation    Wrist supination    (  Blank rows = not tested)  UPPER EXTREMITY MMT:     MMT Right eval Left eval  Shoulder flexion 3+ 3+  Shoulder abduction    Shoulder adduction    Shoulder extension    Shoulder internal rotation    Shoulder external rotation    Middle trapezius    Lower trapezius    Elbow flexion    Elbow extension    Wrist flexion    Wrist extension    Wrist ulnar deviation    Wrist radial deviation    Wrist pronation    Wrist supination    (Blank rows = not tested)  HAND FUNCTION: Grip strength: Right: 26, 25, 27  lbs; Left: 30, 30, 30  lbs Average Right: 26 lbs; Left 30 lbs  07/26/23: Right - 35, 35, 35 Average 35 lbs Left - 30, 36, 31, Average 32.3 lbs  08/02/23 Right - 34, 36, 35 Average 35 lbs Left - 32, 33, 35 Average 33.3 lbs  Tip Right 6, Left 8 Tripod Right 9 Left 13 Lateral Right 10 Left 12  08/23/23 Right - 39.0, 39.9, 41.2 Average 40.0 lbs Left - 35.4, 40.7, 38.8 Average 38.3 lbs  COORDINATION: 9 Hole Peg test: Right: 54.38  sec; Left: 43.10 sec  07/26/23 Right - Trial #1 1:08 Trial #2 42.77 Left - Trial #1 39.2  SENSATION: Not tested  EDEMA: NA  MUSCLE TONE: WFL  COGNITION: Overall cognitive status:  Some trouble with stuttering when she gets talking too fast.  VISION: Subjective report: NA Baseline vision: Wears glasses for reading only Visual history:  NA  VISION ASSESSMENT: Not tested  Patient has difficulty with following activities due to following visual impairments: NA  PERCEPTION: Not tested  PRAXIS: Not tested  OBSERVATIONS: Pt ambulates with no AE and no loss of balance. The pt appears well kept and has t- shirt/athletic pants and sneakers donned. Pt accompanied by her daughter.                                                                                                                           TODAY'S TREATMENT:   Therapeutic Activities  Reviewed medical questions pt had questions about re: stroke with MR review revealing TIA > 10 years ago.  Patient participated in game of [place] requiring moderate verbal cues for proper play and minimal verbal cues to increase use of affected right hand with manipulation of game pieces for fine motor coordination including in hand manipulation.  She was not able to hold > 5 pieces in R hand and was assisted with sequence of releasing game pieces one at a time.    Therapeutic Exercises Pt issued tendon gliding exercises/handout with review of motions to isolate DIP, PIP and MCP joints for straight finger position, hook (DIP/PIP flexion), fist (DIP/PIP/MCP flexion), taco/duck (MCP flexion only) and flat fist (MCP and PIP flexion).     Patient benefited from extra time, verbal/tactile cues, and modeling of task to allow time  for processing of verbal instructions and improve motor planning of unfamiliar movements.  Pt encouraged to use these throughout the day as needed to increase the circulation to the hand and wrist, reduce any  swelling/stiffness and promote increased motion of the wrist, hand and fingers.  PATIENT EDUCATION: Education details: tendon gliding exercises Person educated: Patient Education method: Explanation, Demonstration, Tactile cues, Verbal cues, and Handouts Education comprehension: verbalized understanding, returned demonstration, verbal cues required, tactile cues required, and needs further education  HOME EXERCISE PROGRAM: 07/05/23: Putty Activities - Access Code: [facility] 07/20/23: Coordination Activities 07/26/23: Pwr! Hands handout for big deliberate movements 08/08/23 - sleep positioning, sensory precautions 08/30/23: tendon gliding exercises   GOALS: Goals reviewed with patient? Yes  LONG TERM GOALS: Target date: 07/28/23  Patient will demonstrate updated B UE HEP with visual handouts only for proper execution. Baseline: New to outpt OT  Goal status: MET  4.  Pt will verbalize understanding of adapted strategies and/or equipment PRN to increase safety and independence with ADLs and IADLs. Baseline: New to outpt OT  Goal status: MET  5. Pt will report increased ease and safety with participation in aspects of IADLs and leisure tasks. Baseline: daughter performs majority of home management etc  Goal status: MET  CONTINUED/NEW GOALS:  Cont: 2.  Patient will demonstrate at further 5 lb improvement in B UE grip strength as needed to open jars and other containers. Baseline: Eval: Right: 26 lbs; Left 30 lbs 08/03/23 Right: 35, Left: 33 lbs Goal status: MET 08/23/23 Average: Right: 40.0 lbs Left: 38.3 lbs  Cont: 3.  Patient will demo improved FM coordination as evidenced by completing nine-hole peg 5+ seconds faster with use of BUEs  Baseline: 9 Hole Peg test: EVAL: Right: 54.38 sec; Left: 43.10 sec 07/26/23  Right - Trial #1 1:08 Trial #2 42.77 Left - Trial #1 39.2 Goal status: REVISED  6. Pt will independently recall the 5 main sensory precautions (cold, heat, sharp, chemical, and heavy)  as needed to prevent injury/harm secondary to impairments.    Baseline: R UE described as dull  Goal status: MET  08/23/23 - Pt reports sharp/breakable, hot/cold, heavy with cues for chemical  7. Pt will demonstrate sensory compensation techniques to match 80% of objects by touch with vision occluded.  Baseline: RUE ~30% LUE ~75%  Goal Status: IN Progress   ASSESSMENT:  CLINICAL IMPRESSION: Patient is a 63 y.o. female who was seen today for occupational therapy evaluation for deficits following CVA with UE weakness and incoordination.  Patient required mod to max cues today for [facility] game and is given tendon gliding exercises today to help with stiffness and discomfort in R hand especially. Pt will an additional OT visit in the outpatient setting with discharged plan to complete education and ensure pt feel confident in her return to PLOF.   PERFORMANCE DEFICITS: in functional skills including ADLs, IADLs, coordination, dexterity, ROM, strength, flexibility, Fine motor control, Gross motor control, balance, decreased knowledge of use of DME, and UE functional use, cognitive skills including attention, safety awareness, thought, and understand, and psychosocial skills including coping strategies and environmental adaptation.   IMPAIRMENTS: are limiting patient from ADLs, IADLs, and leisure.   CO-MORBIDITIES: has co-morbidities such as CHF, HTN, TIAs  that affects occupational performance. Patient will benefit from skilled OT to address above impairments and improve overall function.  REHAB POTENTIAL: Good  PLAN:  OT FREQUENCY: 1x/week  OT DURATION: up to 6 weeks - currently planned for 4 weeks  PLANNED  INTERVENTIONS: 97535 self care/ADL training, 16109 therapeutic exercise, 97530 therapeutic activity, 97112 neuromuscular re-education, functional mobility training, energy conservation, coping strategies training, patient/family education, and DME and/or AE instructions  RECOMMENDED OTHER  SERVICES: NA - Pt declined PT/ST services s/p evals today  CONSULTED AND AGREED WITH PLAN OF CARE: Patient and family member/caregiver  PLAN FOR NEXT SESSION:   Review HEPs - review coordination, strength (putty/theraband) Review sensory education AE considerations  Retest coordination/writing/streognosis  DC visit  Victorino Sparrow, OT 08/30/2023, 5:03 PM

## 2023-09-06 ENCOUNTER — Ambulatory Visit: Payer: Self-pay | Admitting: Occupational Therapy

## 2023-09-06 DIAGNOSIS — R278 Other lack of coordination: Secondary | ICD-10-CM

## 2023-09-06 DIAGNOSIS — R208 Other disturbances of skin sensation: Secondary | ICD-10-CM

## 2023-09-06 DIAGNOSIS — M6281 Muscle weakness (generalized): Secondary | ICD-10-CM | POA: Diagnosis not present

## 2023-09-06 DIAGNOSIS — R29818 Other symptoms and signs involving the nervous system: Secondary | ICD-10-CM

## 2023-09-06 NOTE — Patient Instructions (Signed)
 [name]

## 2023-09-06 NOTE — Therapy (Signed)
 OUTPATIENT OCCUPATIONAL THERAPY NEURO TREATMENT & DISCHARGE SUMMARY  Patient Name: [name] MRN: [id number] DOB:01/13/1961, 63 y.o., female Today's Date: 09/06/2023  PCP: Clyda Dark, DO REFERRING PROVIDER: Candee Cha, MD  END OF SESSION:  OT End of Session - 09/06/23 1226     Visit Number 10    Number of Visits 10    Date for OT Re-Evaluation 09/08/23    Authorization Type [employer] Auth Reqd VL: 27    OT Start Time 1220    OT Stop Time 1315    OT Time Calculation (min) 55 min    Equipment Utilized During Treatment Testing material    Activity Tolerance Patient tolerated treatment well    Behavior During Therapy WFL for tasks assessed/performed              Past Medical History:  Diagnosis Date   Chronic combined systolic and diastolic CHF (congestive heart failure) (HCC)    EF now down to 25-30%. LVEDP on cath was 30 mmHg with wedge pressure of 31 mmHg.   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  mean PA 74/39 mean 52, mean PCWP 31 (with V wave of 45 mmHg), LVEDP ~ 30 mmHg.   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 Progressive worsening of aortic and mitral valve disease.   Past Surgical History:  Procedure Laterality Date   AORTIC VALVE REPAIR  August 2010   Suture plication of all 3 commissures   CARDIAC CATHETERIZATION  August 2010   Preop: nonobstructive coronary disease   CESAREAN SECTION  1981; 1996   MITRAL VALVE REPAIR  August 2010    26 mm Sorin [facility] 3D Ring Annuloplasty   RIGHT HEART CATH N/A 08/30/2016   Procedure: Right Heart Cath;  Surgeon: Darlis Eisenmenger, MD;  Location: [facility] INVASIVE CV LAB;  Service: Cardiovascular;  Laterality: N/A;   RIGHT/LEFT HEART CATH AND CORONARY ANGIOGRAPHY N/A 07/08/2016   Procedure: Right/Left Heart Cath and Coronary Angiography;  Surgeon: Arleen Lacer, MD;  Location: [facility] INVASIVE CV LAB;  Service: Cardiovascular: Normal coronaries, mean RA 18, RV pressure/EDP: 70/12/18 mmHg.  mean  PA 74/39 mean 52, mean PCWP 31 (with V wave of 45 mmHg), LVEDP ~ 30 mmHg.  CI 1.98 Fick. Peak AoV gradient ~15 mHg     TEE WITHOUT CARDIOVERSION N/A 07/13/2016   Procedure: Transesophageal Echocardiogram (TEE);  Surgeon: Chrystie Nose, MD;  Location: [facility] ENDOSCOPY;  Service: Cardiovascular:  EF 25-30%, dilated LV - diffuse hypokinesis, rheumatic-appearing Aortic Vallve s/p repair with moderate AS (AVA 1.1 cm^2), moderate central AI.  MV s/p repeat with mild mitral stenosis and eccentric severe MR.     TEE WITHOUT CARDIOVERSION     TEE WITHOUT CARDIOVERSION N/A 06/28/2017   Procedure: TRANSESOPHAGEAL ECHOCARDIOGRAM (TEE);  Surgeon: Laurey Morale, MD;  Location: [facility] ENDOSCOPY;  Service: Cardiovascular;  Laterality: N/A;   TEE WITHOUT CARDIOVERSION N/A 11/21/2017   Procedure: TRANSESOPHAGEAL ECHOCARDIOGRAM (TEE);  Surgeon: Tonny Bollman, MD;  Location: [facility] OR;  Service: Open Heart Surgery;  Laterality: N/A;   THYROID LOBECTOMY Left 09/17/2009   which showed a 3.5 cm follicular variant papillary cancer   THYROIDECTOMY     "2nd OR on my thyroid they took out the whole thing"   TRANSCATHETER AORTIC VALVE REPLACEMENT, TRANSFEMORAL  11/21/2017   Transcatheter Aortic Valve Replacement - Percutaneous Right Transfemoral Approach   TRANSCATHETER AORTIC VALVE REPLACEMENT, TRANSFEMORAL N/A 11/21/2017   Procedure: TRANSCATHETER AORTIC VALVE REPLACEMENT, TRANSFEMORAL using a 23mm Edwards Sapien 3 Aortic Valve;  Surgeon: Tonny Bollman, MD;  Location: [facility] OR;  Service: Open Heart Surgery;  Laterality: N/A;   TRANSESOPHAGEAL ECHOCARDIOGRAM (CATH LAB) N/A 06/12/2023   Procedure: TRANSESOPHAGEAL ECHOCARDIOGRAM;  Surgeon: Little Ishikawa, MD;  Location: [facility] INVASIVE CV LAB;  Service: Cardiovascular;  Laterality: N/A;   TRANSTHORACIC ECHOCARDIOGRAM  05/2016   EF 25-30%. Moderate aortic stenosis with moderate regurgitation. Also potential aortic stenosis with likely worse than expected regurgitation   TRANSTHORACIC ECHOCARDIOGRAM  May 2012    EF 40-45%, inferior hypokinesis. Slightly increased transaortic velocity = mild AS with mild to moderate AI normal MV gradients with no MR. Significantly improved PA pressures. Paradoxical septal motion.   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   Pulmonary hypertension (HCC)    Valvular cardiomyopathy (HCC)    History of GI bleeding    Grade II hemorrhoids 07/06/2016   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   Papillary thyroid carcinoma (HCC) 05/10/2016   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 03/31/2014   Acute on chronic combined systolic and diastolic CHF (congestive heart failure) (HCC)    Obesity, Class III, BMI 40-49.9 (morbid obesity) (HCC)    Neck pain 03/05/2014   Hyperlipidemia with target LDL less than 100 12/16/2013   Hypothyroidism 12/16/2013   Hx of thyroid cancer 12/16/2013   Chronic chest wall pain 10/11/2012   TIA (transient ischemic attack) 05/31/2012   Cardiomyopathy (HCC) 02/28/2012   CHF (congestive heart failure) (HCC) 02/28/2012   Depression 02/13/2012   HTN (hypertension) 02/06/2012   Presence of prosthetic heart valve 02/06/2012   Post-menopausal bleeding 12/05/2011   Fibroids 12/05/2011   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   S/P  aortic valve repair 12/23/2008   S/P MVR (mitral valve repair) 12/23/2008   Other specified postprocedural states 12/23/2008    ONSET DATE: Referral: 06/16/2023 Hospitalization 06/09/23 - 06/17/23  REFERRING DIAG: I63.10 (ICD-10-CM) - 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  Pt accompanied by: self  PERTINENT HISTORY:  PMHx: rheumatic heart valve disease s/p MVR and  TAVR, papillary thyroid carcinoma follicular variant (s/p thyroidectomy), hypothyroidism, pulmonary HTN, valvular cardiomyopathy/HF, HLD, CKD, HTN, TIA, T2DM, history of GIB who was admitted to the [facility] Teaching Service at [facility] for right sided weakness.  CT negative for acute intracranial hemorrhage but revealed minimal hyperdensity seen in the left insular M2 concerning for possible thrombus, and encephalomalacia in the L parietal and occipital lobes. CTA demonstrated focal embolus within the L inferior division of M2 branch with diminished distal perfusion and tortuous common carotid arteries bilaterally.  Embolus concerned to be from rheumatic heart valvular disease. Abbreviated TEE performed due to patient becoming hypoxic likely secondary to bronchospasm/laryngospasm and requiring intubation and short ICU admission.   PRECAUTIONS: Fall  WEIGHT BEARING RESTRICTIONS: No  PAIN:  NA upon Are you having pain? Yes: NPRS scale: not really hurting - just aching Pain location: right hand/inside palm Pain description: achy  Aggravating factors: not sure - happens occasionally throughout the day  Relieving factors: pain patch    FALLS: Has patient fallen in last 6 months? No  LIVING ENVIRONMENT: Lives with: lives with their daughter Lives in: apartment Stairs: No Has following equipment at home: None  PLOF: Independent - pt did not drive and was on disability (s/p heart surgery & thyroid cancer)  PATIENT GOALS: Pt unable to identify anything specific but upon completion of eval, was in agreement to treatment to address observed weakness and lack of coordination  OBJECTIVE:  Note: Objective measures were completed at Evaluation unless otherwise noted.  HAND DOMINANCE: Right  ADLs: Overall ADLs: Independent - no concerns from daughter either Transfers/ambulation related to ADLs: Ind Tub Shower transfers: Ind without Camera operator: none  IADLs: Shopping: Goes with daughter Light  housekeeping: Lets her daughter do it Meal Prep: Daughter likes to [employer] mobility: Ind - does not use Social research officer, government very often Medication management: Letting daughter do it for ie) sort into pill box Financial management: Ind Handwriting:  NT - no concerns reported  MOBILITY STATUS: Independent  POSTURE COMMENTS:  rounded shoulders and forward head Sitting balance: WNL  ACTIVITY TOLERANCE: Activity tolerance: Similar to prior to stroke, learning to pace herself  FUNCTIONAL OUTCOME MEASURES: Quick Dash: 4.5  UPPER EXTREMITY ROM:    Active ROM Right eval Left eval  Shoulder flexion End range limitations End range limitations  Shoulder abduction    Shoulder adduction    Shoulder extension    Shoulder internal rotation    Shoulder external rotation    Elbow flexion    Elbow extension    Wrist flexion    Wrist extension    Wrist ulnar deviation    Wrist radial deviation    Wrist pronation    Wrist supination    (  Blank rows = not tested)  UPPER EXTREMITY MMT:     MMT Right eval Left eval  Shoulder flexion 3+ 3+  Shoulder abduction    Shoulder adduction    Shoulder extension    Shoulder internal rotation    Shoulder external rotation    Middle trapezius    Lower trapezius    Elbow flexion    Elbow extension    Wrist flexion    Wrist extension    Wrist ulnar deviation    Wrist radial deviation    Wrist pronation    Wrist supination    (Blank rows = not tested)  HAND FUNCTION: Grip strength: Right: 26, 25, 27  lbs; Left: 30, 30, 30  lbs Average Right: 26 lbs; Left 30 lbs  07/26/23: Right - 35, 35, 35 Average 35 lbs Left - 30, 36, 31, Average 32.3 lbs  08/02/23 Right - 34, 36, 35 Average 35 lbs Left - 32, 33, 35 Average 33.3 lbs  Tip Right 6, Left 8 Tripod Right 9 Left 13 Lateral Right 10 Left 12  08/23/23 Right - 39.0, 39.9, 41.2 Average 40.0 lbs Left - 35.4, 40.7, 38.8 Average 38.3 lbs  COORDINATION: 9 Hole Peg test: Right: 54.38  sec; Left: 43.10 sec  07/26/23 Right - 42.77 Left - 39.2  09/06/23 Right 37.19 Left 34.29  SENSATION: Not tested  EDEMA: NA  MUSCLE TONE: WFL  COGNITION: Overall cognitive status:  Some trouble with stuttering when she gets talking too fast.  VISION: Subjective report: NA Baseline vision: Wears glasses for reading only Visual history:  NA  VISION ASSESSMENT: Not tested  Patient has difficulty with following activities due to following visual impairments: NA  PERCEPTION: Not tested  PRAXIS: Not tested  OBSERVATIONS: Pt ambulates with no AE and no loss of balance. The pt appears well kept and has t- shirt/athletic pants and sneakers donned. Pt accompanied by her daughter.                                                                                                                           TODAY'S TREATMENT:   Self Care: OT educated pt on joint protection principles as noted in pt instructions as needed to decrease UE discomfort.   Handout is provided regarding joint protection and patient is encouraged to consider the specific acronym "LESS" ie) less strain on joints  L: Listen to your body E: Energy Conservation S: Stronger Joints take the Lead S: Strategize   Patient encouraged to protect hands and wrist by  - Respecting for Pain and stopping activities before they reach the point of discomfort or pain  - Rest and Work Balance ie) balancing activities with appropriate rests during activity - Reduction of Effort - Use two hands instead of one if possible  - Use of Larger/Stronger Joints Ie) Lift or carry with the forearm or shoulder rather than fingers - Avoid Activities That Cannot Be Stopped - Use of Assistive Equipment - Consider  splint use and AE equipment to protect joints from deformity and stresses Then reviewed activities that can be modified with adaptive equipment and encourage patient to look adaptive equipment for arthritis [i.e. to consider joint  protection].   Therapeutic Activities  Retested strength, coordination and reviewed discharge instructions for final OT visit today. Right UE strength average today 40.0 lbs compared to 26.0 lbs at eval  Left UE strength average 38..3 lbs compared to 30.0 lbs at eval                Good improvement in B UE coordination via 9 Hole Peg test Right: 37.19 sec down from 54.38 at eval Left: 34.29 sec down from 43.10 sec at eval  Pt performed printing with good size letters on wide ruled notebook paper, 100% legible with mixture of upper/lower case letters and no micrographia noted. Pt encouraged to continue writing at home for grocery lists, notes to loved ones, writing on calendar etc.                     Reviewed discharge instructions re: joint protection ie) keeping wrists neutral, using splints/gloves, taking breaks, using AE, continuing with sleep positioning recommendations and continued HEPs - coordination, putty, tendon glides and big deliberate motions per handouts provided.  Finally, encouraged social interaction with church family and community programs for max mood, motivation and socialization.   PATIENT EDUCATION: Education details: joint protection Person educated: Patient Education method: Explanation, Demonstration, Verbal cues, and Handouts Education comprehension: verbalized understanding, returned demonstration, and verbal cues required  HOME EXERCISE PROGRAM: 07/05/23: Putty Activities - Access Code: [facility] 07/20/23: Coordination Activities 07/26/23: Pwr! Hands handout for big deliberate movements 08/08/23 - sleep positioning, sensory precautions 08/30/23: tendon gliding exercises 09/06/23: Joint protection education   GOALS: Goals reviewed with patient? Yes  LONG TERM GOALS: Target date: 07/28/23  Patient will demonstrate updated B UE HEP with visual handouts only for proper execution. Baseline: New to outpt OT  Goal status: MET  4.  Pt will verbalize understanding of  adapted strategies and/or equipment PRN to increase safety and independence with ADLs and IADLs. Baseline: New to outpt OT  Goal status: MET  5. Pt will report increased ease and safety with participation in aspects of IADLs and leisure tasks. Baseline: daughter performs majority of home management etc  Goal status: MET  CONTINUED/NEW GOALS:  Cont: 2.  Patient will demonstrate at further 5 lb improvement in B UE grip strength as needed to open jars and other containers. Baseline: Eval: Right: 26 lbs; Left 30 lbs 08/03/23 Right: 35, Left: 33 lbs Goal status: MET 08/23/23 Average: Right: 40.0 lbs Left: 38.3 lbs 09/06/23 Right: 41.0 lbs Left 42.9  Cont: 3.  Patient will demo improved FM coordination as evidenced by completing nine-hole peg 5+ seconds faster with use of BUEs  Baseline: 9 Hole Peg test: EVAL: Right: 54.38 sec; Left: 43.10 sec 07/26/23  Right - Trial #1 1:08 Trial #2 42.77 Left - Trial #1 39.2 Goal status: MET 09/06/23: Right 37.19 sec Left 34.29 sec  6. Pt will independently recall the 5 main sensory precautions (cold, heat, sharp, chemical, and heavy) as needed to prevent injury/harm secondary to impairments.    Baseline: R UE described as dull  Goal status: MET  08/23/23 - Pt reports sharp/breakable, hot/cold, heavy with cues for chemical  7. Pt will demonstrate sensory compensation techniques to match 80% of objects by touch with vision occluded.  Baseline: RUE ~30% LUE ~75%  Goal Status: MET 09/06/23 - RUE - 5/6 items    ASSESSMENT:  CLINICAL IMPRESSION: Patient is a 63 y.o. female who was seen today for occupational therapy evaluation for deficits following CVA with UE weakness and incoordination and sensory changes.  Patient continued to report intermittent stiffness and discomfort in hands with jt protection education provided. Patient is appropriate for discharge and no longer demonstrates medical necessity for continued skilled occupational therapy  services.  PERFORMANCE DEFICITS: in functional skills including ADLs, IADLs, coordination, dexterity, ROM, strength, flexibility, Fine motor control, Gross motor control, balance, decreased knowledge of use of DME, and UE functional use, cognitive skills including attention, safety awareness, thought, and understand, and psychosocial skills including coping strategies and environmental adaptation.   IMPAIRMENTS: are limiting patient from ADLs, IADLs, and leisure.   CO-MORBIDITIES: has co-morbidities such as CHF, HTN, TIAs  that affects occupational performance. Patient will benefit from skilled OT to address above impairments and improve overall function.  REHAB POTENTIAL: Good  PLAN:  OT FREQUENCY: 1x/week  OT DURATION: up to 6 weeks - currently planned for 4 weeks  PLANNED INTERVENTIONS: 97535 self care/ADL training, 82956 therapeutic exercise, 97530 therapeutic activity, 97112 neuromuscular re-education, functional mobility training, energy conservation, coping strategies training, patient/family education, and DME and/or AE instructions  RECOMMENDED OTHER SERVICES: NA - Pt declined PT/ST services s/p evals today  CONSULTED AND AGREED WITH PLAN OF CARE: Patient and family member/caregiver  OCCUPATIONAL THERAPY DISCHARGE SUMMARY  Visits from Start of Care: 10  Current functional level related to goals / functional outcomes: Pt has met all goals to satisfactory levels and is pleased with outcomes.   Remaining deficits: Pt has no more significant functional deficits or pain.   Education / Equipment: Pt has all needed materials and education. Pt understands how to continue on with self-management. See education list noted here for more details: 07/05/23: Putty Activities - Access Code: [facility] 07/20/23: Coordination Activities 07/26/23: Pwr! Hands handout for big deliberate movements 08/08/23: sleep positioning, sensory precautions 08/30/23: tendon gliding exercises 09/06/23: Joint  protection education   Patient agrees to discharge due to max benefits received from outpatient occupational therapy at this time.    Zora Hires, OT 09/06/2023, 7:29 PM

## 2023-09-14 ENCOUNTER — Other Ambulatory Visit (HOSPITAL_COMMUNITY): Payer: Self-pay

## 2023-09-14 DIAGNOSIS — I5022 Chronic systolic (congestive) heart failure: Secondary | ICD-10-CM

## 2023-09-14 MED ORDER — ROSUVASTATIN CALCIUM 20 MG PO TABS: 20.0000 mg | ORAL_TABLET | Freq: Every day | ORAL | 1 refills | Status: DC

## 2023-09-18 ENCOUNTER — Other Ambulatory Visit: Payer: Self-pay | Admitting: Family Medicine

## 2023-10-06 ENCOUNTER — Other Ambulatory Visit (HOSPITAL_COMMUNITY): Payer: Self-pay | Admitting: Cardiology

## 2023-10-06 MED ORDER — FUROSEMIDE 20 MG PO TABS
20.0000 mg | ORAL_TABLET | Freq: Every day | ORAL | 3 refills | Status: DC
Start: 2023-10-06 — End: 2024-01-25

## 2023-10-07 ENCOUNTER — Other Ambulatory Visit: Payer: Self-pay | Admitting: Family Medicine

## 2023-10-26 ENCOUNTER — Telehealth (HOSPITAL_COMMUNITY): Payer: Self-pay | Admitting: Cardiology

## 2023-10-26 MED ORDER — ENTRESTO 24-26 MG PO TABS: 1.0000 | ORAL_TABLET | Freq: Two times a day (BID) | ORAL | 3 refills | Status: DC

## 2023-10-26 NOTE — Telephone Encounter (Signed)
 Refill requested

## 2023-12-15 ENCOUNTER — Other Ambulatory Visit: Payer: Self-pay | Admitting: Family Medicine

## 2023-12-19 ENCOUNTER — Other Ambulatory Visit: Payer: Self-pay | Admitting: Cardiology

## 2024-01-02 ENCOUNTER — Telehealth (HOSPITAL_COMMUNITY): Payer: Self-pay

## 2024-01-02 DIAGNOSIS — I5022 Chronic systolic (congestive) heart failure: Secondary | ICD-10-CM

## 2024-01-02 MED ORDER — SPIRONOLACTONE 25 MG PO TABS: 12.5000 mg | ORAL_TABLET | Freq: Every day | ORAL | 5 refills | Status: DC

## 2024-01-02 NOTE — Telephone Encounter (Signed)
 Patient called and follow up appointment with D M has been scheduled.

## 2024-01-15 ENCOUNTER — Other Ambulatory Visit (HOSPITAL_COMMUNITY): Payer: Self-pay

## 2024-01-15 ENCOUNTER — Telehealth: Payer: Self-pay | Admitting: Pharmacy Technician

## 2024-01-15 NOTE — Telephone Encounter (Signed)
 Prior auth cancelled: $4.00 copay for 3 months

## 2024-01-15 NOTE — Telephone Encounter (Signed)
 Pharmacy Patient Advocate Encounter   Received notification from Fax that prior authorization for ozempic  2mg /75ml is required/requested.   Insurance verification completed.   The patient is insured through [facility] [facility] .   Per test claim: PA required; PA submitted to above mentioned insurance via Latent Key/confirmation #/EOC [id number] Status is pending

## 2024-01-23 ENCOUNTER — Other Ambulatory Visit: Payer: Self-pay

## 2024-01-23 DIAGNOSIS — I739 Peripheral vascular disease, unspecified: Secondary | ICD-10-CM

## 2024-01-25 ENCOUNTER — Other Ambulatory Visit (HOSPITAL_COMMUNITY): Payer: Self-pay | Admitting: Internal Medicine

## 2024-02-05 ENCOUNTER — Ambulatory Visit

## 2024-02-05 ENCOUNTER — Encounter (HOSPITAL_COMMUNITY)

## 2024-02-21 DEATH — deceased

## 2024-02-22 ENCOUNTER — Ambulatory Visit (HOSPITAL_COMMUNITY): Attending: Family Medicine

## 2024-02-22 ENCOUNTER — Ambulatory Visit (HOSPITAL_COMMUNITY): Admission: RE | Admit: 2024-02-22

## 2024-02-23 ENCOUNTER — Telehealth (HOSPITAL_COMMUNITY): Payer: Self-pay | Admitting: Cardiology

## 2024-02-23 NOTE — Telephone Encounter (Signed)
 Called to confirm/remind patient of their appointment at [facility] on [facility]/23/2024.   Appointment:   [] Confirmed  [] Left mess   [x] No answer/No voice mail  [] VM Full/unable to leave message  [x] Phone not in service  Patient reminded to bring all medications and/or complete list.  Confirmed patient has transportation. Gave directions, instructed to utilize valet parking.

## 2024-02-26 ENCOUNTER — Encounter (HOSPITAL_COMMUNITY): Admitting: Cardiology
